# Patient Record
Sex: Male | Born: 1955 | Race: White | Hispanic: No | Marital: Married | Smoking: Former smoker
Health system: Southern US, Community
[De-identification: ages and names within clinical notes are randomized; demographics above are authoritative.]

## PROBLEM LIST (undated history)

## (undated) DIAGNOSIS — J439 Emphysema, unspecified: Secondary | ICD-10-CM

## (undated) DIAGNOSIS — I10 Essential (primary) hypertension: Secondary | ICD-10-CM

## (undated) DIAGNOSIS — M259 Joint disorder, unspecified: Secondary | ICD-10-CM

## (undated) DIAGNOSIS — Z8701 Personal history of pneumonia (recurrent): Secondary | ICD-10-CM

## (undated) DIAGNOSIS — D6851 Activated protein C resistance: Secondary | ICD-10-CM

## (undated) DIAGNOSIS — R4702 Dysphasia: Secondary | ICD-10-CM

## (undated) DIAGNOSIS — Z8719 Personal history of other diseases of the digestive system: Secondary | ICD-10-CM

## (undated) DIAGNOSIS — Z860100 Personal history of colon polyps, unspecified: Secondary | ICD-10-CM

## (undated) DIAGNOSIS — M7552 Bursitis of left shoulder: Secondary | ICD-10-CM

## (undated) DIAGNOSIS — R0609 Other forms of dyspnea: Secondary | ICD-10-CM

## (undated) DIAGNOSIS — M51369 Other intervertebral disc degeneration, lumbar region without mention of lumbar back pain or lower extremity pain: Secondary | ICD-10-CM

## (undated) DIAGNOSIS — G4733 Obstructive sleep apnea (adult) (pediatric): Secondary | ICD-10-CM

## (undated) DIAGNOSIS — G4730 Sleep apnea, unspecified: Secondary | ICD-10-CM

## (undated) DIAGNOSIS — G709 Myoneural disorder, unspecified: Secondary | ICD-10-CM

## (undated) DIAGNOSIS — G629 Polyneuropathy, unspecified: Secondary | ICD-10-CM

## (undated) DIAGNOSIS — J189 Pneumonia, unspecified organism: Secondary | ICD-10-CM

## (undated) DIAGNOSIS — R200 Anesthesia of skin: Secondary | ICD-10-CM

## (undated) DIAGNOSIS — R911 Solitary pulmonary nodule: Secondary | ICD-10-CM

## (undated) DIAGNOSIS — Z973 Presence of spectacles and contact lenses: Secondary | ICD-10-CM

## (undated) DIAGNOSIS — Z8709 Personal history of other diseases of the respiratory system: Secondary | ICD-10-CM

## (undated) DIAGNOSIS — R0600 Dyspnea, unspecified: Secondary | ICD-10-CM

## (undated) DIAGNOSIS — M7551 Bursitis of right shoulder: Secondary | ICD-10-CM

## (undated) DIAGNOSIS — K219 Gastro-esophageal reflux disease without esophagitis: Secondary | ICD-10-CM

## (undated) DIAGNOSIS — Z8711 Personal history of peptic ulcer disease: Secondary | ICD-10-CM

## (undated) DIAGNOSIS — I2510 Atherosclerotic heart disease of native coronary artery without angina pectoris: Secondary | ICD-10-CM

## (undated) DIAGNOSIS — K760 Fatty (change of) liver, not elsewhere classified: Secondary | ICD-10-CM

## (undated) DIAGNOSIS — M5136 Other intervertebral disc degeneration, lumbar region: Secondary | ICD-10-CM

## (undated) DIAGNOSIS — G8929 Other chronic pain: Secondary | ICD-10-CM

## (undated) DIAGNOSIS — K5792 Diverticulitis of intestine, part unspecified, without perforation or abscess without bleeding: Secondary | ICD-10-CM

## (undated) DIAGNOSIS — R208 Other disturbances of skin sensation: Secondary | ICD-10-CM

## (undated) DIAGNOSIS — C61 Malignant neoplasm of prostate: Secondary | ICD-10-CM

## (undated) DIAGNOSIS — M1990 Unspecified osteoarthritis, unspecified site: Secondary | ICD-10-CM

## (undated) DIAGNOSIS — K449 Diaphragmatic hernia without obstruction or gangrene: Secondary | ICD-10-CM

## (undated) DIAGNOSIS — I82409 Acute embolism and thrombosis of unspecified deep veins of unspecified lower extremity: Secondary | ICD-10-CM

## (undated) DIAGNOSIS — M109 Gout, unspecified: Secondary | ICD-10-CM

## (undated) HISTORY — DX: Solitary pulmonary nodule: R91.1

## (undated) HISTORY — DX: Emphysema, unspecified: J43.9

## (undated) HISTORY — PX: VIDEO ASSISTED THORACOSCOPY (VATS)/DECORTICATION: SHX6171

## (undated) HISTORY — PX: JOINT REPLACEMENT: SHX530

## (undated) HISTORY — PX: COLON SURGERY: SHX602

## (undated) HISTORY — PX: HEMORRHOID SURGERY: SHX153

## (undated) HISTORY — DX: Atherosclerotic heart disease of native coronary artery without angina pectoris: I25.10

## (undated) HISTORY — PX: HIP ARTHROPLASTY: SHX981

## (undated) HISTORY — DX: Unspecified osteoarthritis, unspecified site: M19.90

## (undated) HISTORY — PX: APPENDECTOMY: SHX54

## (undated) HISTORY — DX: Gout, unspecified: M10.9

## (undated) HISTORY — DX: Other chronic pain: G89.29

---
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
## 1983-08-05 HISTORY — PX: VASECTOMY: SHX75

## 2003-04-05 ENCOUNTER — Ambulatory Visit (HOSPITAL_COMMUNITY): Admission: RE | Admit: 2003-04-05 | Discharge: 2003-04-05 | Payer: Self-pay | Admitting: Internal Medicine

## 2003-08-05 HISTORY — PX: SHOULDER ARTHROSCOPY: SHX128

## 2004-08-04 DIAGNOSIS — Z8701 Personal history of pneumonia (recurrent): Secondary | ICD-10-CM

## 2004-08-04 HISTORY — DX: Personal history of pneumonia (recurrent): Z87.01

## 2005-03-23 ENCOUNTER — Inpatient Hospital Stay (HOSPITAL_COMMUNITY): Admission: EM | Admit: 2005-03-23 | Discharge: 2005-03-26 | Payer: Self-pay | Admitting: Emergency Medicine

## 2005-04-09 ENCOUNTER — Ambulatory Visit (HOSPITAL_COMMUNITY): Admission: RE | Admit: 2005-04-09 | Discharge: 2005-04-09 | Payer: Self-pay | Admitting: Family Medicine

## 2005-05-02 ENCOUNTER — Encounter: Admission: RE | Admit: 2005-05-02 | Discharge: 2005-05-02 | Payer: Self-pay | Admitting: Neurology

## 2005-05-03 ENCOUNTER — Encounter: Admission: RE | Admit: 2005-05-03 | Discharge: 2005-05-03 | Payer: Self-pay | Admitting: Neurology

## 2005-06-06 ENCOUNTER — Ambulatory Visit (HOSPITAL_COMMUNITY): Admission: RE | Admit: 2005-06-06 | Discharge: 2005-06-06 | Payer: Self-pay | Admitting: Family Medicine

## 2005-06-12 ENCOUNTER — Ambulatory Visit (HOSPITAL_COMMUNITY): Admission: RE | Admit: 2005-06-12 | Discharge: 2005-06-12 | Payer: Self-pay | Admitting: Family Medicine

## 2005-06-13 ENCOUNTER — Inpatient Hospital Stay (HOSPITAL_COMMUNITY): Admission: AD | Admit: 2005-06-13 | Discharge: 2005-06-17 | Payer: Self-pay | Admitting: Family Medicine

## 2005-06-13 ENCOUNTER — Encounter (INDEPENDENT_AMBULATORY_CARE_PROVIDER_SITE_OTHER): Payer: Self-pay | Admitting: Specialist

## 2005-06-13 ENCOUNTER — Ambulatory Visit (HOSPITAL_COMMUNITY): Admission: RE | Admit: 2005-06-13 | Discharge: 2005-06-13 | Payer: Self-pay | Admitting: Family Medicine

## 2005-07-01 ENCOUNTER — Ambulatory Visit (HOSPITAL_COMMUNITY): Admission: RE | Admit: 2005-07-01 | Discharge: 2005-07-01 | Payer: Self-pay | Admitting: Family Medicine

## 2005-07-04 ENCOUNTER — Ambulatory Visit (HOSPITAL_COMMUNITY): Admission: RE | Admit: 2005-07-04 | Discharge: 2005-07-04 | Payer: Self-pay | Admitting: Family Medicine

## 2005-08-04 HISTORY — PX: COLONOSCOPY: SHX174

## 2005-09-24 ENCOUNTER — Ambulatory Visit: Payer: Self-pay | Admitting: Internal Medicine

## 2005-10-17 ENCOUNTER — Ambulatory Visit: Payer: Self-pay | Admitting: Internal Medicine

## 2005-10-17 ENCOUNTER — Encounter (INDEPENDENT_AMBULATORY_CARE_PROVIDER_SITE_OTHER): Payer: Self-pay | Admitting: *Deleted

## 2005-10-17 ENCOUNTER — Ambulatory Visit (HOSPITAL_COMMUNITY): Admission: RE | Admit: 2005-10-17 | Discharge: 2005-10-17 | Payer: Self-pay | Admitting: Internal Medicine

## 2007-04-14 ENCOUNTER — Encounter (HOSPITAL_COMMUNITY): Admission: RE | Admit: 2007-04-14 | Discharge: 2007-05-04 | Payer: Self-pay | Admitting: Family Medicine

## 2007-09-10 ENCOUNTER — Encounter: Payer: Self-pay | Admitting: Pulmonary Disease

## 2008-01-20 ENCOUNTER — Ambulatory Visit: Payer: Self-pay | Admitting: Internal Medicine

## 2008-01-21 ENCOUNTER — Encounter (INDEPENDENT_AMBULATORY_CARE_PROVIDER_SITE_OTHER): Payer: Self-pay | Admitting: General Surgery

## 2008-01-21 ENCOUNTER — Observation Stay (HOSPITAL_COMMUNITY): Admission: RE | Admit: 2008-01-21 | Discharge: 2008-01-21 | Payer: Self-pay | Admitting: General Surgery

## 2008-01-24 ENCOUNTER — Ambulatory Visit (HOSPITAL_COMMUNITY): Admission: RE | Admit: 2008-01-24 | Discharge: 2008-01-24 | Payer: Self-pay | Admitting: Anesthesiology

## 2008-02-06 ENCOUNTER — Encounter: Admission: RE | Admit: 2008-02-06 | Discharge: 2008-02-06 | Payer: Self-pay | Admitting: Orthopedic Surgery

## 2009-02-21 ENCOUNTER — Ambulatory Visit (HOSPITAL_COMMUNITY): Admission: RE | Admit: 2009-02-21 | Discharge: 2009-02-21 | Payer: Self-pay | Admitting: Family Medicine

## 2009-03-27 ENCOUNTER — Encounter: Payer: Self-pay | Admitting: Cardiology

## 2009-03-28 ENCOUNTER — Encounter (INDEPENDENT_AMBULATORY_CARE_PROVIDER_SITE_OTHER): Payer: Self-pay | Admitting: Family Medicine

## 2009-03-28 ENCOUNTER — Ambulatory Visit: Payer: Self-pay | Admitting: Cardiology

## 2009-03-28 ENCOUNTER — Ambulatory Visit (HOSPITAL_COMMUNITY): Admission: RE | Admit: 2009-03-28 | Discharge: 2009-03-28 | Payer: Self-pay | Admitting: Family Medicine

## 2009-03-28 HISTORY — PX: TRANSTHORACIC ECHOCARDIOGRAM: SHX275

## 2009-08-21 ENCOUNTER — Encounter: Payer: Self-pay | Admitting: Cardiology

## 2009-08-21 ENCOUNTER — Ambulatory Visit (HOSPITAL_COMMUNITY): Admission: RE | Admit: 2009-08-21 | Discharge: 2009-08-21 | Payer: Self-pay | Admitting: Family Medicine

## 2009-08-29 ENCOUNTER — Telehealth: Payer: Self-pay | Admitting: Cardiology

## 2009-09-07 ENCOUNTER — Ambulatory Visit: Payer: Self-pay | Admitting: Cardiology

## 2009-09-07 DIAGNOSIS — R0602 Shortness of breath: Secondary | ICD-10-CM

## 2009-09-07 DIAGNOSIS — R5383 Other fatigue: Secondary | ICD-10-CM

## 2009-09-07 DIAGNOSIS — J930 Spontaneous tension pneumothorax: Secondary | ICD-10-CM | POA: Insufficient documentation

## 2009-09-07 DIAGNOSIS — M7150 Other bursitis, not elsewhere classified, unspecified site: Secondary | ICD-10-CM | POA: Insufficient documentation

## 2009-09-07 DIAGNOSIS — J939 Pneumothorax, unspecified: Secondary | ICD-10-CM | POA: Insufficient documentation

## 2009-09-07 DIAGNOSIS — J449 Chronic obstructive pulmonary disease, unspecified: Secondary | ICD-10-CM | POA: Insufficient documentation

## 2009-09-07 DIAGNOSIS — R5381 Other malaise: Secondary | ICD-10-CM

## 2009-09-11 ENCOUNTER — Ambulatory Visit: Payer: Self-pay | Admitting: Internal Medicine

## 2009-09-11 ENCOUNTER — Telehealth (INDEPENDENT_AMBULATORY_CARE_PROVIDER_SITE_OTHER): Payer: Self-pay

## 2009-09-12 ENCOUNTER — Ambulatory Visit: Payer: Self-pay | Admitting: Cardiovascular Disease

## 2009-09-12 ENCOUNTER — Ambulatory Visit: Payer: Self-pay | Admitting: Cardiology

## 2009-09-12 ENCOUNTER — Ambulatory Visit: Payer: Self-pay

## 2009-09-12 ENCOUNTER — Encounter (HOSPITAL_COMMUNITY): Admission: RE | Admit: 2009-09-12 | Discharge: 2009-11-07 | Payer: Self-pay | Admitting: Cardiology

## 2009-09-12 HISTORY — PX: CARDIOVASCULAR STRESS TEST: SHX262

## 2009-09-14 ENCOUNTER — Encounter: Payer: Self-pay | Admitting: Cardiology

## 2009-09-19 LAB — CONVERTED CEMR LAB: TSH: 1.34 microintl units/mL (ref 0.35–5.50)

## 2009-09-26 ENCOUNTER — Ambulatory Visit: Payer: Self-pay | Admitting: Cardiology

## 2009-09-27 ENCOUNTER — Ambulatory Visit: Payer: Self-pay | Admitting: Pulmonary Disease

## 2009-09-27 DIAGNOSIS — G4710 Hypersomnia, unspecified: Secondary | ICD-10-CM | POA: Insufficient documentation

## 2009-09-27 DIAGNOSIS — G4730 Sleep apnea, unspecified: Secondary | ICD-10-CM

## 2009-10-15 ENCOUNTER — Ambulatory Visit (HOSPITAL_BASED_OUTPATIENT_CLINIC_OR_DEPARTMENT_OTHER): Admission: RE | Admit: 2009-10-15 | Discharge: 2009-10-15 | Payer: Self-pay | Admitting: Pulmonary Disease

## 2009-10-17 ENCOUNTER — Ambulatory Visit: Payer: Self-pay | Admitting: Pulmonary Disease

## 2009-10-17 ENCOUNTER — Encounter: Payer: Self-pay | Admitting: Internal Medicine

## 2009-10-29 ENCOUNTER — Ambulatory Visit: Payer: Self-pay | Admitting: Pulmonary Disease

## 2009-12-16 ENCOUNTER — Encounter: Payer: Self-pay | Admitting: Pulmonary Disease

## 2009-12-26 ENCOUNTER — Telehealth (INDEPENDENT_AMBULATORY_CARE_PROVIDER_SITE_OTHER): Payer: Self-pay | Admitting: *Deleted

## 2010-01-14 ENCOUNTER — Encounter: Payer: Self-pay | Admitting: Pulmonary Disease

## 2010-09-05 NOTE — Assessment & Plan Note (Signed)
Summary: Cardiology Nuclear Study  Nuclear Med Background Indications for Stress Test: Evaluation for Ischemia   History: COPD, CT/MRI, Echo, Emphysema  History Comments: 8/10 Echo: EF=55-60%.  Symptoms: Chest Tightness, Fatigue, Fatigue with Exertion, SOB    Nuclear Pre-Procedure Cardiac Risk Factors: Family History - CAD, History of Smoking Caffeine/Decaff Intake: None NPO After: 8:30 PM Lungs: clear IV 0.9% NS with Angio Cath: 22g     IV Site: (R) AC IV Started by: Irean Hong RN Chest Size (in) 42     Height (in): 74 Weight (lb): 160 BMI: 20.62  Nuclear Med Study 1 or 2 day study:  1 day     Stress Test Type:  Stress Reading MD:  Charlton Haws, MD     Referring MD:  B.Brodie Resting Radionuclide:  Technetium 17m Tetrofosmin     Resting Radionuclide Dose:  10.0 mCi  Stress Radionuclide:  Technetium 10m Tetrofosmin     Stress Radionuclide Dose:  33.0 mCi   Stress Protocol Exercise Time (min):  14:00 min     Max HR:  153 bpm     Predicted Max HR:  168 bpm  Max Systolic BP: 170 mm Hg     Percent Max HR:  91.07 %     METS: 17.20 Rate Pressure Product:  16109    Stress Test Technologist:  Milana Na EMT-P     Nuclear Technologist:  Burna Mortimer Deal RT-N  Rest Procedure  Myocardial perfusion imaging was performed at rest 45 minutes following the intravenous administration of Myoview Technetium 29m Tetrofosmin.  Stress Procedure  The patient exercised for 14:00. The patient stopped due to sob and denied any chest pain.  There were no significant ST-T wave changes and rare pvcs/pacs.  Myoview was injected at peak exercise and myocardial perfusion imaging was performed after a brief delay.  QPS Raw Data Images:  Normal; no motion artifact; normal heart/lung ratio. Stress Images:  NI: Uniform and normal uptake of tracer in all myocardial segments. Rest Images:  Normal homogeneous uptake in all areas of the myocardium. Subtraction (SDS):  Normal Transient Ischemic  Dilatation:  1.00  (Normal <1.22)  Lung/Heart Ratio:  .30  (Normal <0.45)  Quantitative Gated Spect Images QGS EDV:  114 ml QGS ESV:  49 ml QGS EF:  57 % QGS cine images:  Normal  Findings Normal nuclear study      Overall Impression  Exercise Capacity: Good exercise capacity. BP Response: Normal blood pressure response. Clinical Symptoms: Dyspnea ECG Impression: No significant ST segment change suggestive of ischemia. Overall Impression: Normal stress nuclear study. Overall Impression Comments: Normal  Appended Document: Cardiology Nuclear Study Heather, Can you tell ok. BB  Appended Document: Cardiology Nuclear [facility].  Appended Document: Cardiology Nuclear Study Pt aware of results.

## 2010-09-05 NOTE — Progress Notes (Signed)
Summary: cpap download  Phone Note Call from Patient Call back at Home Phone [phone]   Caller: Patient Call For: [name] Summary of Call: pt cancelled his f/u appt w/ dr Vassie Loll for today. wants dr Vassie Loll to know that the co. that does the download for his cpap is having some issues w/ this at the moment so there's no need for him to come in today. however, pt will call to reschedule just as soon as he hears back from the co. just an FYI for dr Vassie Loll per caller. (no need for a call back).  Initial call taken by: Tivis Ringer, CNA,  Dec 26, 2009 9:32 AM  Follow-up for Phone Call        can we make sure probs are solved  Called [facility] and Angie stated that I needed to speak with Roderic Ovens on the status of download and she would be back in the office around 3:30. She will have her return my call. Alfonso Ramus  Dec 27, 2009 2:49 PM  Dianna Rossetti returning your call, her number is [phone].Darletta Moll  Dec 27, 2009 3:24 PM   Follow-up by: Comer Locket. Vassie Loll MD,  Dec 26, 2009 10:43 AM  Additional Follow-up for Phone Call Additional follow up Details #1::        leandra faxed download 12/27/09 Additional Follow-up by: Oneita Jolly,  Dec 28, 2009 5:06 PM

## 2010-09-05 NOTE — Assessment & Plan Note (Signed)
Summary: [name]   Visit Type:  Initial Consult Primary Provider:  John Giovanni  CC:  Fatigue for no reason.  History of Present Illness: The patient is 55 years old and is referred by Dr. Sudie Bailey for evaluation of fatigue. These symptoms have been present for about 6 months. He says when he tries to do some exercise sometimes he will get out very quickly with fatigue and had to stop. Other times he can do his exercise without too much problems. He does swimming and a lot of stretching exercises and some other aerobic exercises as well. He's had no chest pain with exercise. Sometimes the fatigue will occur at rest and not just related to exercise.  His risk profile her vascular disease includes a positive family history and a history of smoking.  He had an echocardiogram performed and a CT chest scan of the chest. The echo was normal and the CT scan showed some evidence of emphysema. He said Dr. Sudie Bailey check oxygen saturations before and after exercise and they were good.  Current Medications (verified): 1)  No Meds  Allergies (verified): No Known Drug Allergies  Past History:  Past Medical History: Reviewed history from 09/07/2009 and no changes required.  1.  Left spontaneous pneumothorax in 1984.  2.  Bilateral shoulder bursitis, recently treated with cortisone injections      by his company physician.  Family History: Reviewed history from 09/07/2009 and no changes required.  No known family history of colorectal carcinoma or chronic  GI problems.  Mother deceased at age 22 with history of CHF and diabetes.  Father, age 56, with history of COPD and coronary artery disease with CABG..  He has  three grown healthy brothers.  Social History: Reviewed history from 09/07/2009 and no changes required. Mr. Egelston is divorced.  He has two children who are grown  and healthy.  He is employed full time with a tobacco company.  He reports  30-plus pack-year history of tobacco  use.  He says he consumes a few cigarettes a day now.He consumes about three alcoholic  beverages a day.  He denies any drug use.  Vital Signs:  Patient profile:   55 year old male Height:      74 inches Weight:      168 pounds BMI:     21.65 Pulse rate:   70 / minute Pulse rhythm:   regular Resp:     18 per minute BP sitting:   112 / 62  (left arm) Cuff size:   regular  Vitals Entered By: Vikki Ports (September 07, 2009 1:42 PM)  Physical Exam  Additional Exam:  Gen. Well-nourished, in no distress Head: Normocephalic    Eyes: PERRLA/EOM intact; conjunctiva and lids normal Neck: No JVD, thyroid not enlarged, no carotid bruits Lungs: No tachypnea, clear w/o rales, rhonchi or wheezes CV: Rhythm regular, PMI not displaced,  sounds  nl, grade 1/6 systolic murmur at the left sternal edge, no edema, pulses nl UE and LE Abd: BS nl, abd soft and non-tender w/o masses or hepatosplenomegaly MS: No deformities Neuro: no focal sns Skin: no lesions Psych: nl affect    Impression & Recommendations:  Problem # 1:  FATIGUE / MALAISE (ICD-780.79) The etiology of the fatigue is not clear. It is not totally exertional although sometimes it does come on with exertion. He had no chest pain with exertion. We will plan to evaluate him for possible cardiac etiology for his symptoms and we'll arrange for  him to have a rest/exercise Myoview scan. We will also obtain the records from Dr. Michelle Nasuti office to see if there is any other source of fatigue.  Problem # 2:  COPD (ICD-496) He has a history of cigarette smoking and his CT scan showed some evidence of emphysema. We will evaluate him with Coumadin function tests.  Other Orders: EKG w/ Interpretation (93000) Pulmonary Function Test (PFT) Nuclear Stress Test (Nuc Stress Test)  Patient Instructions: 1)  Your physician recommends that you schedule a follow-up appointment in: 3 weeks. 2)  Your physician has requested that you have an exercise  stress myoview.  For further information please visit [email or web address]  Please follow instruction sheet, as given. 3)  Your physician has recommended that you have a pulmonary function test.  Pulmonary Function Tests are a group of tests that measure how well air moves in and out of your lungs.  Appended Document: [name] I received the records from Dr. Michelle Nasuti office. The patient has sleep apnea and had not been using CPAP. He then began using CPAP still had symptoms of fatigue.  His CBC was normal with a hemoglobin of 14.8. His comprehensive metabolic panel was normal including normal liver function tests. His hemoglobin A1c was 6.2. His blood gases showed a pH of 7.41, PCO2 of 36, and pO2 of 92.

## 2010-09-05 NOTE — Progress Notes (Signed)
Summary: Nuc. Pre-Procedure  Phone Note Outgoing Call Call back at Work Phone [phone]   Call placed by: Irean Hong, RN,  September 11, 2009 2:37 PM Summary of Call: Reviewed information on Myoview Information Sheet (see scanned document for further details).  Spoke with patient.     Nuclear Med Background Indications for Stress Test: Evaluation for Ischemia   History: COPD, CT/MRI, Echo, Emphysema  History Comments: 8/10 Echo: EF=55-60%.  Symptoms: Fatigue, Fatigue with Exertion    Nuclear Pre-Procedure Cardiac Risk Factors: Family History - CAD, History of Smoking Height (in): 74

## 2010-09-05 NOTE — Assessment & Plan Note (Signed)
Summary: rov after sleep study ///kp   Visit Type:  Follow-up Copy to:  Dr. Juanda Chance Primary Provider/Referring Provider:  John Giovanni  CC:  Pt here for follow up after sleep study.  History of Present Illness: 55/M, smoker for evaluation of excessive fatigue. Cardiac evaluation was normal by Dr Juanda Chance, testesterone, TSH & cortisol levels have been nml. He smoked about 70 PYrs, works as a Production designer, theatre/television/film for [employer] still smokes an occasional cigarette. He has a rigorous cross training schedule & has lost 60 lbs in the last 2 years. He finds that he cannot push himself lately like he was able to. Epworth Sleepiness Score is 14/24. Bedtime is 9-10pm, sleep latency is minimal, wakes up at 5 a , no dryness or headaches. He drinks 8-10 cups coffee/d PFTS showed airway obstruction with preserved  FEv1 90%, small airways 52% & DLCO 73%. Split pSG in 2/09 (Dr Dohmeier)  when wt was 215 lbs, BMI 28showed 200 hypopneas in 2 hrs of  sleep with AHi 99/h, supine AHi of 66/h corrected by CPAP 11 cm (to AHi of 5/h) & 2 L Kaser. Of note, he was recovering from a pneumonia at the time of the study. he was provided with CPAP by [facility] & used it for a year but stopped after his weight loss. No bed partner history available.  October 29, 2009 12:08 PM  reviewed PSG with pt , severe obstructive sleep apnea persists with AHi 31/h, start CPAP 9 cm with medium quattro mask + humidity, download in 4 wks There is no history suggestive of cataplexy, sleep paralysis or parasomnias   Allergies: No Known Drug Allergies  Past History:  Past Medical History: Last updated: 09/07/2009  1.  Left spontaneous pneumothorax in 1984.  2.  Bilateral shoulder bursitis, recently treated with cortisone injections      by his company physician.  Social History: Last updated: 09/27/2009 Mr. Alegria is divorced.  He has two children who are grown  and healthy.  He is employed full time with a tobacco company.  He reports  30-plus  pack-year history of tobacco use.  He says he consumes a few cigarettes a day now.He consumes about three alcoholic  beverages a day.  He denies any drug use. Occupation: Production designer, theatre/television/film  Review of Systems  The patient denies anorexia, fever, weight loss, weight gain, vision loss, decreased hearing, hoarseness, chest pain, syncope, dyspnea on exertion, peripheral edema, prolonged cough, headaches, hemoptysis, abdominal pain, melena, hematochezia, severe indigestion/heartburn, hematuria, muscle weakness, difficulty walking, depression, unusual weight change, and abnormal bleeding.    Vital Signs:  Patient profile:   55 year old male Height:      74 inches Weight:      168.25 pounds O2 Sat:      98 % on Room air Temp:     98.1 degrees F oral Pulse rate:   62 / minute BP sitting:   98 / 60  (left arm) Cuff size:   regular  Vitals Entered By: Zackery Barefoot CMA (October 29, 2009 11:36 AM)  O2 Flow:  Room air CC: Pt here for follow up after sleep study Comments Medications reviewed with patient Verified contact number and pharmacy with patient Zackery Barefoot CMA  October 29, 2009 11:36 AM    Physical Exam  Additional Exam:  Gen. Pleasant, well-nourished, in no distress, normal affect ENT - no lesions, no post nasal drip, class 2 airway Neck: No JVD, no thyromegaly, no carotid bruits Lungs: no use of accessory  muscles, no dullness to percussion, clear without rales or rhonchi  Cardiovascular: Rhythm regular, heart sounds  normal, no murmurs or gallops, no peripheral edema Musculoskeletal: No deformities, no cyanosis or clubbing      Impression & Recommendations:  Problem # 1:  HYPERSOMNIA, ASSOCIATED WITH SLEEP APNEA (ICD-780.53)  Based on his initial experience I think he will tolerate CPAP therapy well. It remains to be seen whether we will see an improvement in daytime fatigue. I would persist with this regardless. Doub that we will use stimulants here like modafinil. I had a frank  discussion with him re: expectations. Compliance encouraged, wt loss emphasized, asked to avoid meds with sedative side effects, cautioned against driving when sleepy.   Orders: Est. Patient Level III (16109)  Problem # 2:  COPD (ICD-496) mild , does not need bronchodilators. ct aerobic exercise.  Patient Instructions: 1)  Please schedule a follow-up appointment in 2 months. 2)  Lets have a conversation once I look at the download  Appended Document: rov after sleep study ///kp download 3/25-5/15 >> good usage on days used, residual AHI minimal but he needs to use CPAP on all nights !  Appended Document: rov after sleep study ///kp [facility]  Appended Document: rov after sleep study ///kp LMOMTCB x 2. Per RA "he needs to use CPAP on all nights !" Also need to get pt scheduled for follow up in July  Appended Document: rov after sleep study ///kp will send letter

## 2010-09-05 NOTE — Miscellaneous (Signed)
Summary: Orders Update pft charges  Clinical Lists Changes  Orders: Added new Service order of Carbon Monoxide diffusing w/capacity (94720) - Signed Added new Service order of Lung Volumes (94240) - Signed Added new Service order of Spirometry (Pre & Post) (94060) - Signed 

## 2010-09-05 NOTE — Assessment & Plan Note (Signed)
Summary: F/U CARDIOLITE/PFT   Visit Type:  Follow-up Primary Provider:  [name]  CC:  no complaints.  History of Present Illness: The patient is 55 years old and returns for evaluation of fatigue. He is a very active person and swims and walks on the treadmill but has been bothered by symptoms of fatigue over the past number of months. Dr. Georgia Dom about him for this and I saw him recently and did further evaluation to try and determine etiology of his fatigue. He had no associated chest pain with this.  He had an echocardiogram which showed normal LV function and normal valve function. He had a CT scan which showed no pulmonary embolus and mild COPD. His oxygen saturations were good at rest and with exercise. We did a breast exercise Myoview scan this was negative for ischemia. He had a normal TSH. He had pulmonary function tests that showed only mild COPD.  He does have a history of sleep apnea and has used CPAP in the past. He has not used it recently. He's not sure if he snores at night or if he stops breathing at night. He is also not sure if he gets a full night's rest at night. He does occasionally fall asleep during the day. His main symptom is fatigue.  Problems Prior to Update: 1)  COPD  (ICD-496) 2)  Fatigue / Malaise  (ICD-780.79) 3)  Fatigue / Malaise  (ICD-780.79) 4)  Shortness of Breath  (ICD-786.05) 5)  Bursitis  (ICD-727.3) 6)  Pneumothorax  (ICD-512.8)  Current Medications (verified): 1)  No Meds  Allergies (verified): No Known Drug Allergies  Past History:  Past Medical History: Reviewed history from 09/07/2009 and no changes required.  1.  Left spontaneous pneumothorax in 1984.  2.  Bilateral shoulder bursitis, recently treated with cortisone injections      by his company physician.  Review of Systems       ROS is negative except as outlined in HPI.   Vital Signs:  Patient profile:   55 year old male Height:      74 inches Weight:      165  pounds BMI:     21.26 Pulse rate:   61 / minute BP sitting:   113 / 67  (left arm)  Vitals Entered By: Burnett Kanaris, CNA (September 26, 2009 2:05 PM)  Physical Exam  Additional Exam:  Gen. Well-nourished, in no distress   Neck: No JVD, thyroid not enlarged, no carotid bruits Lungs: No tachypnea, clear without rales, rhonchi or wheezes Cardiovascular: Rhythm regular, PMI not displaced,  heart sounds  normal, no murmurs or gallops, no peripheral edema, pulses normal in all 4 extremities. Abdomen: BS normal, abdomen soft and non-tender without masses or organomegaly, no hepatosplenomegaly. MS: No deformities, no cyanosis or clubbing   Neuro:  No focal sns   Skin:  no lesions    Impression & Recommendations:  Problem # 1:  FATIGUE / MALAISE (ICD-780.79) The etiology of the fatigue is not clear but I don't think it is cardiac in etiology. Has a negative Myoview scan and a normal echo and a normal exam and ECG. I am suspicious this the fatigue is related to obstructive sleep apnea. He previously had this diagnosis and he is not currently using CPAP. I discussed this possibility with him and he is agreeable to further evaluation or consultation for this and we've arranged for him to see one of our pulmonary/sleep doctors. Orders: Pulmonary Referral (Pulmonary)  Problem #  2:  COPD (ICD-496) This appears mild based on his pulmonary function tests. I did encourage him to stop and discontinue cigarettes since this could get worse and since this will contribute to cardiac problems in the future.  Patient Instructions: 1)  We will refer you to pulmonary for evaluation of sleep apnea. 2)  Dr. Juanda Chance will see you back on an as needed basis.

## 2010-09-05 NOTE — Assessment & Plan Note (Signed)
Summary: fatigue/? sleep apnea/apc   Visit Type:  Initial Consult Copy to:  Dr. Juanda Chance Primary Provider/Referring Provider:  John Giovanni  CC:  Pt here for consult. Pt c/o fatigue during the day . Pt states not currently using CPAP machine ([facility]). Pt states does not want to continue using CPAP machine and has made lifestyle changes.  History of Present Illness: 53/M, smoker for evaluation of excessive fatigue. Cardiac evaluation was normal by Dr Juanda Chance, testesterone, TSH & cortisol levels have been nml. He smoked about 5 PYrs, works as a Production designer, theatre/television/film for [employer] still smokes an occasional cigarette. He has a rigorous cross training schedule & has lost 60 lbs in the last 2 years. He finds that he cannot push himself lately like he was able to. Epworth Sleepiness Score is 14/24. Bedtime is 9-10pm, sleep latency is minimal, wakes up at 5 a , no dryness or headaches. He drinks 8-10 cups coffee/d PFTS showed airway obstruction with preserved  FEv1 90%, small airways 52% & DLCO 73%. Split pSG in 2/09 (Dr Dohmeier)  when wt was 215 lbs, BMI 28showed 200 hypopneas in 2 hrs of  sleep with AHi 99/h, supine AHi of 66/h corrected by CPAP 11 cm (to AHi of 5/h) & 2 L [place]. Of note, he was recovering from a pneumonia at the time of the study. he was provided with CPAP by [facility] & used it for a year but stopped after his weight loss. No bed partner history available. There is no history suggestive of cataplexy, sleep paralysis or parasomnias    History of Present Illness: Fatigue  What time do you typically go to bed?(between what hours): 9pm 10pm  How long does it take you to fall asleep?  How many times during the night do you wake up? (0) rare if at all  What time do you get out of bed to start your day? 5am  Do you drive or operate heavy machinery in your occupation? no   How much has your weight changed (up or down) over the past two years? (in pounds): -60 lbs  Have you ever had  a sleep study before?  If yes,when and where: yes-2 years in 2009  Do you currently use CPAP ? If so , at what pressure? not @ present time  Do you wear oxygen at any time? If yes, how many liters per minute? no Current Medications (verified): 1)  No Meds  Allergies (verified): No Known Drug Allergies  Past History:  Past Medical History: Last updated: 09/07/2009  1.  Left spontaneous pneumothorax in 1984.  2.  Bilateral shoulder bursitis, recently treated with cortisone injections      by his company physician.  Past Surgical History: Last updated: 09/07/2009 None  Family History: Last updated: 09/07/2009  No known family history of colorectal carcinoma or chronic  GI problems.  Mother deceased at age 78 with history of CHF and diabetes.  Father, age 41, with history of COPD and coronary artery disease with CABG..  He has  three grown healthy brothers.  Social History: Mr. Havens is divorced.  He has two children who are grown  and healthy.  He is employed full time with a tobacco company.  He reports  30-plus pack-year history of tobacco use.  He says he consumes a few cigarettes a day now.He consumes about three alcoholic  beverages a day.  He denies any drug use. Occupation: Production designer, theatre/television/film  Review of Systems  The patient complains of shortness of breath with activity, shortness of breath at rest, and productive cough.  The patient denies non-productive cough, coughing up blood, chest pain, irregular heartbeats, acid heartburn, indigestion, loss of appetite, weight change, abdominal pain, difficulty swallowing, sore throat, tooth/dental problems, headaches, nasal congestion/difficulty breathing through nose, sneezing, itching, ear ache, anxiety, depression, hand/feet swelling, joint stiffness or pain, rash, change in color of mucus, and fever.    Vital Signs:  Patient profile:   55 year old male Height:      74 inches Weight:      168.25 pounds O2 Sat:      96 % on Room  air Temp:     98.0 degrees F oral Pulse rate:   66 / minute BP sitting:   102 / 68  (left arm) Cuff size:   regular  Vitals Entered By: Zackery Barefoot CMA (September 27, 2009 1:54 PM)  O2 Flow:  Room air CC: Pt here for consult. Pt c/o fatigue during the day . Pt states not currently using CPAP machine ([facility]). Pt states does not want to continue using CPAP machine and has made lifestyle changes Comments Medications reviewed with patient Verified contact number and pharmacy with patient Zackery Barefoot CMA  September 27, 2009 1:55 PM    Physical Exam  Additional Exam:  Gen. Pleasant, well-nourished, in no distress, normal affect ENT - no lesions, no post nasal drip, class 2 airway Neck: No JVD, no thyromegaly, no carotid bruits Lungs: no use of accessory muscles, no dullness to percussion, clear without rales or rhonchi  Cardiovascular: Rhythm regular, heart sounds  normal, no murmurs or gallops, no peripheral edema Abdomen: soft and non-tender, no hepatosplenomegaly, BS normal. Musculoskeletal: No deformities, no cyanosis or clubbing Neuro:  alert, non focal     Impression & Recommendations:  Problem # 1:  HYPERSOMNIA, ASSOCIATED WITH SLEEP APNEA (ICD-780.53) He has lost 60 lbs & I would expect his obstructive sleep apnea to have improved. Will schedule in lab study & compare with old one  - it is quite surprising that he had an AHI of 99/h with a bMI of 28. I would not be surprised to find that his oSA is not weight dependent & that he would need cPAP again. ALSO  of interest would be whether he needs O2 during sleep. Orders: Consultation Level III (16109) Sleep Disorder Referral (Sleep Disorder) Consultation Level IV (60454)  Problem # 2:  COPD (ICD-496) preserved FEV1 but significnat affection of smaller airways. Smoking cessation was emphasized - he does not want inhalers & I am in agreement with this.  Patient Instructions: 1)  Please schedule a follow-up  appointment in 2 weeks after sleep study  Appended Document: Orders Update reviewed PSG with pt , severe obstructive sleep apnea persists with AHi 31/h, start CPAP 9 cm with medium quattro mask + humidity, download in 4 wks   Clinical Lists Changes  Orders: Added new Referral order of DME Referral (DME) - Signed

## 2010-09-05 NOTE — Progress Notes (Signed)
Summary: Appt   Phone Note Outgoing Call Call back at [phone]   Call placed by: Sherri Rad, RN, BSN,  August 29, 2009 10:29 AM Call placed to: Patient Summary of Call: I LM for the pt to call about his appt scheduled for friday 09/07/09 @ 1:30pm with Dr. Juanda Chance. Sherri Rad, RN, BSN  August 29, 2009 10:30 AM  [facility] @ cell #- no answer @ the pt's home # x 10 rings. Sherri Rad, RN, BSN  August 30, 2009 11:18 AM  Pt aware of appt.  Initial call taken by: Sherri Rad, RN, BSN,  August 30, 2009 1:58 PM

## 2010-09-05 NOTE — Letter (Signed)
SummaryScience writer Pulmonary Care Appointment Letter  [facility]  [address]   [place], [place] [address]   Phone: [phone]  Fax: [phone]    01/14/2010 MRN: [id number]  [name] [address] [place], [place]  [address]  Dear Mr. BRAULT,   Our office is attempting to contact you about an appointment in July.  Please call our office at [phone] to schedule this appointment with Dr.Alva.  Our registration staff is prepared to assist you with any questions you may have.    Thank you,   Nature conservation officer Pulmonary Division

## 2010-10-20 LAB — BLOOD GAS, ARTERIAL
Bicarbonate: 22.6 mEq/L (ref 20.0–24.0)
FIO2: 0.21 %
O2 Saturation: 97.3 %
pCO2 arterial: 35.7 mmHg (ref 35.0–45.0)
pH, Arterial: 7.419 (ref 7.350–7.450)
pO2, Arterial: 92 mmHg (ref 80.0–100.0)

## 2010-12-17 NOTE — Assessment & Plan Note (Signed)
NAMEMarland Kitchen  [name], [name]               CHART#:  [id number]   DATE:  01/20/2008                       DOB:  1955-08-14   CHIEF COMPLAINT:  Proctalgia and hemorrhoids.   PROBLEM LIST:  1. History of internal hemorrhoids, which caused intermittent small-      volume hematochezia, confirmed on03/16/2007 colonoscopy.  2. Hyperplastic colon polyps.  3. Anxiety.  4. Depression.  5. Pleurisy and left lung abscess, requiring drainage.  6. Right shoulder rotator cuff repair.  7. Remote peptic ulcer disease.  8. H. pylori gastritis, status post treatment.   SUBJECTIVE:  This patient is a 55 year old Caucasian male who complains  of a 1-week history of a knot outside of my rectum, I think, is a  hemorrhoid.  It is quite painful.  It is worse when he coughs and worse  with sitting and movement.  He has been having small-to-moderate volume  pink-to-reddish bleeding, which has been staining his underwear.  He  denies any problems of constipation or diarrhea.  He has been using over-  the-counter Tucks pads as well as ointment without any relief.  He  denies any abdominal pain.  Denies any nausea or vomiting.  Denies any  fever or chills.  He has intentionally lost 30 pounds in the last 6  months or so, but he has been trying by dieting and eating much  healthier as well as exercising.   CURRENT MEDICATIONS:  1. Multivitamin daily.  2. Ibuprofen 200 mg p.r.n.  3. Cymbalta 60 mg daily.  4. Tucks ointment p.r.n.   ALLERGIES:  No known drug allergies.   FAMILY HISTORY:  There is no known family history of colorectal  carcinoma or other chronic GI problems.   SOCIAL HISTORY:  This patient is divorced.  He is involved with  maintenance, planning, and scheduling.  He has a 30-plus pack year  history of tobacco use.  He quit drinking alcohol in February 2009.  Denies any drug use.   REVIEW OF SYSTEMS:  See HPI, otherwise negative.   PHYSICAL EXAMINATION:  VITAL SIGNS:  Weight 175.5 pounds,  height 74  inches, temperature 98.5, blood pressure 110/70, and pulse 80.  GENERAL:  The patient is a well-developed, well-nourished Caucasian  male, in no acute distress.  HEENT:  Throat clear.  Sclerae nonicteric.  Conjunctivae pink.  Oropharynx pink and moist without any lesions.  CHEST:  Heart has a regular rate and rhythm.  No S1, S2.  No murmurs,  clicks, rubs, or gallops.  ABDOMEN:  Positive bowel sounds x4.  No bruits auscultated.  Soft,  nontender, and nondistended without palpable mass or hepatosplenomegaly.  RECTAL:  He had a large protruding bleeding rectal tissue, which is  approximately 2 x 1 cm, appears to be prolapsed versus prolapsed  internal hemorrhoid.  He is quite tender on exam.  I did attempt to  lubricate and manually reduce it; however, it is quite painful for the  patient.  EXTREMITIES:  Without clubbing or edema.   IMPRESSION:  This patient is a 55 year old Caucasian male with what  appears to be rectal prolapse of a possible internal hemorrhoid.  I have  sent him to his surgeon of choice, Dr. Malvin Johns, for further evaluation  and treatment.   PLAN:  Appointment today with Dr. Malvin Johns at 2:30 p.m. and he will go  there immediately.       [name], N.P.  Electronically Signed     R. Roetta Sessions, M.D.  Electronically Signed    KJ/MEDQ  D:  01/20/2008  T:  01/21/2008  Job:  [id number]   cc:   Barbaraann Barthel, M.D.  Mila Homer. Sudie Bailey, M.D.

## 2010-12-17 NOTE — Op Note (Signed)
NAME:  [name], [name] NO.:  [id number]   MEDICAL RECORD NO.:  [id number]          PATIENT TYPE:  OBV   LOCATION:  A316                          FACILITY:  [facility]   PHYSICIAN:  Barbaraann Barthel, M.D. DATE OF BIRTH:  1956-01-17   DATE OF PROCEDURE:  01/21/2008  DATE OF DISCHARGE:                               OPERATIVE REPORT   SURGEON:  Barbaraann Barthel, MD   PREOPERATIVE DIAGNOSIS:  Prolapsed bleeding, internal hemorrhoids,  possible perianal tumor.   POSTOPERATIVE DIAGNOSIS:  Internal hemorrhoids, prolapsed   Note that this is a 55 year old white male who was referred to me from  the GI service with perianal discomfort.  It was thought that he had  internal hemorrhoids.  On examination, this appeared very eroded and  there was in my mind  the possibility of a perianal tumor.  We were  unable to do any further examinations in my office as the risk of  bleeding and the patient's extreme discomfort.  We told him to continue  warm compresses, which he has been doing for the 3 weeks with this  problem, and we made plans for an outpatient procedure the following  morning.  We discussed the need for examination under anesthesia and  that I would likely do a rigid proctoscopy.  This patient has had a  previous colonoscopy the last year.  We would likely do an internal  hemorrhoidectomy to remove the lesion under spinal anesthesia as this  patient had considerable pulmonary problems in the past and he is a  heavy smoker.   We discussed complications not limited to but including bleeding,  infection, and the possibility of more surgery might be required  depending on the pathological findings.  Informed consent was obtained.   TECHNIQUE:  The patient was taken to the operating room and placed in  the jackknife prone position after spinal anesthesia.  Digital  examination and rigid proctoscopy were then performed.  This under  examination became quite clearly that I  was dealing with internal  hemorrhoid and not a dreaded perianal pathology.  We then examined the  patient at 20 cm under rigid proctoscopy.  I did not see any other  mucosal lesions or any other areas of concern.  We then grasped the  internal hemorrhoid with the Buie clamp.  I excised the internal  hemorrhoid using the cautery device.  This was in the left lateral  position and then oversewed this with 2-0 gut suture in a 2-layer  closure.  Bleeding was controlled and minimal.  I checked for  hemostasis, then irrigated with normal saline solution and dilute  Betadine solution and then used a perianal block using 0.5% Sensorcaine  for postoperative comfort.  A 2%  viscous Xylocaine ointment was then placed on a 4x4 and ABD pad.  Prior  to closure, all sponge, needle, and instrument counts were found to be  correct.  Estimated blood loss was minimal.  The patient received 600 mL  of crystalloids intraoperatively.  There were no complications.      Barbaraann Barthel, M.D.  Electronically Signed  WB/MEDQ  D:  01/21/2008  T:  01/21/2008  Job:  [id number]   cc:   R. Roetta Sessions, M.D.  [address]  [place]   [address]

## 2010-12-20 NOTE — Op Note (Signed)
NAME:  [name], [name]              ACCOUNT NO.:  [id number]   MEDICAL RECORD NO.:  [id number]           PATIENT TYPE:  AMB   LOCATION:  DAY                           FACILITY:  [facility]   PHYSICIAN:  R. Roetta Sessions, M.D. DATE OF BIRTH:  April 09, 1956   DATE OF PROCEDURE:  10/17/2005  DATE OF DISCHARGE:                                 OPERATIVE REPORT   PROCEDURE:  Colonoscopy with snare polypectomy and biopsy.   INDICATIONS FOR PROCEDURE:  A 55 year old gentleman with a 77-month history  of intermittent small-volume painless rectal bleeding. There is no family  history of colorectal neoplasia. Colonoscopy is now being done. This  approach has been discussed with the patient at length. Potential risks,  benefits, and alternatives have been reviewed and questions answered. He is  agreeable. Please see documentation in the medical record.   PROCEDURE NOTE:  O2 saturation, blood pressure, pulse, and respirations were  monitored throughout the entire procedure. Conscious sedation with IV Versed  and Demerol in incremental doses.   INSTRUMENT:  Olympus video chip system.   FINDINGS:  Digital rectal exam revealed no abnormalities.   ENDOSCOPIC FINDINGS:  Prep was good.   Rectum:  Examination of the rectal mucosa including retroflexed view of the  anal verge revealed some internal hemorrhoids and a 5-mm pedunculated polyp  at 3 cm in from the anal verge. Otherwise, rectal mucosa appeared normal.   Colon:  Colonic mucosa was surveyed from the rectosigmoid junction through  the left, transverse, and right colon to the area of the appendiceal  orifice, ileocecal valve, and cecum. These structures were well seen and  photographed for the record. From this level, the scope was slowly  withdrawn, and all previously mentioned mucosal surfaces were again seen.  There was a 1 x 4 cm multilobulated polyp on a fold in the mid ascending  colon that was resected in piecemeal fashion. Multiple fragments  submitted  to the pathologist. There was also a 5-mm mid descending colon polyp which  was removed with the snare. In the mid sigmoid colon, there were two areas  of markedly injected colonic mucosa on opposite walls. It appeared to be an  intramucosal change in appearance, but I did not see a demarcation zone.  These areas did not appear to be polyps. This could be scope trauma but had  an unusual appearance. They were cold biopsied. The polyp in the rectum was  removed with snare cautery. The patient tolerated the procedure well and  reactive to endoscopy.   IMPRESSION:  1.  Internal hemorrhoids. Diminutive rectal polyp removed with snare.  2.  Polyps in the mid descending and right colon resected as described      above. A couple of areas of injected folds mid descending colon of      doubtful clinical significance biopsied. The remainder of the colonic      mucosa appeared normal. Suspect the patient bleed from benign anorectal      source.   RECOMMENDATIONS:  1.  Hemorrhoid literature provided to the patient.  2.  Ten-day course of Anusol HC  suppositories 1 per rectum at bedtime.  3.  Follow up on pathology. CBC today for further recommendations to follow.      [name], M.D.  Electronically Signed     RMR/MEDQ  D:  10/17/2005  T:  10/18/2005  Job:  [id number]   cc:   Mila Homer. Sudie Bailey, M.D.  Fax: [phone]

## 2010-12-20 NOTE — H&P (Signed)
NAMEMarland Kitchen  Joe, [name] NO.:  [id number]   MEDICAL RECORD NO.:  [id number]          PATIENT TYPE:  INP   LOCATION:  A302                          FACILITY:  [facility]   PHYSICIAN:  Mila Homer. Sudie Bailey, M.D.DATE OF BIRTH:  17-Mar-1956   DATE OF ADMISSION:  06/13/2005  DATE OF DISCHARGE:  LH                                HISTORY & PHYSICAL   HISTORY:  This 55 year old was treated for pneumonia, about a month ago,  seemed to get better, and then took a relapse.  X-rays were suggestive of  left lower lobe pneumonia so we started back on Levaquin 750 mg daily which  he did well with and improved about 50%, but then just was not getting  better.  At this time I arranged for him to get a CT scan of the lung, which  showed an abscess in the left lower lung, just diagnosed yesterday.   He is generally healthy, he has been a long-term cigarette smoker, however,  and just stopped recently.  He does have emphysema secondary to this.  He  has been on prednisone 10 mg daily. For inflammation; and otherwise does not  use other medications.  [place]  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  Today he had aspiration of 60 mL of a yellowish moderate  viscosity purulent material of the left lower lung by Dr. Charlett Nose,  interventional radiologist.   Currently his temperature is 98 degrees, pulse 89, respiratory rate 20,  blood pressure 131/77.  He is resting comfortably.  He is oriented, alert,  well-developed, and well-nourished in no acute distress  The lungs actually  are fairly clear throughout, moving air well.  The heart has a regular  rhythm at a rate of about 90.  The abdomen is soft without  hepatosplenomegaly mass or tenderness. There is no edema of the ankles.  Skin turgor is normal.   ASSESSMENT:  1.  Left lower lobe pneumonia with abscess.  2.  Chronic obstructive pulmonary disease.  3.  History of cigarette smoking.   PLAN OF TREATMENT:  Will be  to have him in the hospital over the weekend  with irrigation of his 10-French drainage catheter with 10 mL normal saline  every shift.  We will keep him on Levaquin 750 mg daily; and discontinue the  prednisone.  Culture on the purulent material is pending.      Mila Homer. Sudie Bailey, M.D.  Electronically Signed     SDK/MEDQ  D:  06/13/2005  T:  06/13/2005  Job:  [id number]

## 2010-12-20 NOTE — Discharge Summary (Signed)
NAMEDYLLEN, MENNING NO.:  [id number]   MEDICAL RECORD NO.:  [id number]          PATIENT TYPE:  INP   LOCATION:  A338                          FACILITY:  [facility]   PHYSICIAN:  Kingsley Callander. Ouida Sills, MD       DATE OF BIRTH:  05-11-1956   DATE OF ADMISSION:  03/23/2005  DATE OF DISCHARGE:  08/23/2006LH                                 DISCHARGE SUMMARY   DISCHARGE DIAGNOSES:  1.  Community-acquired pneumonia involving the right middle lobe and      lingula.  2.  Emphysema.   HOSPITAL COURSE:  this patient is a 55 year old, white male who presented to  the emergency room with pleuritic chest pain.  His temperature was 99.1.  His chest x-ray revealed an infiltrate in the right base.  He underwent a CT  scan in the emergency room which revealed infiltrates in the right middle  lobe and lingula.  Moderate pulmonary emphysema was also present.  His ABG  revealed a pH of 7.34, pCO2 49, pO2 of 65 on 4 L.  His white count was 13.8.  D-dimer was 0.45.  He was hospitalized and treated with IV Rocephin and  Zithromax.  He experienced quite severe pleuritic chest pain requiring IV  morphine.  He had a low-grade fever which resolved.  His pain improved.  He  was improved enough for conversion to outpatient therapy on August 23.  His  oxygen saturations have run in the low 90s.  His pleuritic pain has also  been treated with Indocin.  A repeat white count was 13,000.   DISCHARGE MEDICATIONS:  1.  Augmentin 875 mg b.i.d. x7 more days.  2.  Indocin SR 75 mg b.i.d.  3.  Vicodin q.4h. p.r.n.  4.  He will hold his Arthrotec while on Indocin.   FOLLOW UP:  Follow-up with Dr. Sudie Bailey in 1 week.  It is recommended that  he have a follow-up chest x-ray to ensure clearance of his infiltrates.  He  has been encouraged to discontinue smoking.      Kingsley Callander. Ouida Sills, MD  Electronically Signed     ROF/MEDQ  D:  03/26/2005  T:  03/26/2005  Job:  [id number]

## 2010-12-20 NOTE — Group Therapy Note (Signed)
NAMEMarland Kitchen  [name], [name] NO.:  [id number]   MEDICAL RECORD NO.:  [id number]          PATIENT TYPE:  INP   LOCATION:  A302                          FACILITY:  [facility]   PHYSICIAN:  Mila Homer. Sudie Bailey, M.D.DATE OF BIRTH:  1955/10/25   DATE OF PROCEDURE:  06/17/2005  DATE OF DISCHARGE:                                   PROGRESS NOTE   SUBJECTIVE:  He is really feeling a lot better, in fact, he is getting sort  of bored.   OBJECTIVE:  VITAL SIGNS:  Temperature 97.6, pulse 75, respirations 20, blood  pressure 110/71.  GENERAL:  He is oriented and alert in no acute distress, well-developed,  well-nourished.  Color is good.  He is semi-recumbent in bed.  LUNGS:  Clear moving air well.  HEART:  Regular rhythm and rate at about 70.  EXTREMITIES:  There is no edema of the ankles.   His abscessed cavity in the left lower lobe did grow out microaerophilic  Streptococcus.   ASSESSMENT:  1.  Left lung abscess secondary to microaerophilic Streptococcus, question      from mouth flora.  2.  Chronic obstructive pulmonary disease.  3.  Tobacco use disorder.  4.  Alcohol use.   PLAN:  I talked to him at length.  I have discussed his case with  interventional radiologists.  We are redoing his CT scan with contrast and  if the abscessed cavity is gone and he is not having any increased drainage  from the lung (should not be more going in than is coming out and irrigation  of his abscess) then he will be able to be discharged home.  He is going to  stay off tobacco products and strongly recommend that he stay off of alcohol  until we review his case as an outpatient.  If there is any problem with  either CT or irrigations, he will stay another few days until everything  clears up.      Mila Homer. Sudie Bailey, M.D.  Electronically Signed     SDK/MEDQ  D:  06/17/2005  T:  06/17/2005  Job:  [id number]

## 2010-12-20 NOTE — Discharge Summary (Signed)
NAMEMarland Kitchen  [name], [name] NO.:  [id number]   MEDICAL RECORD NO.:  [id number]          PATIENT TYPE:  INP   LOCATION:  A302                          FACILITY:  [facility]   PHYSICIAN:  Mila Homer. Sudie Bailey, M.D.DATE OF BIRTH:  1955-12-21   DATE OF ADMISSION:  06/13/2005  DATE OF DISCHARGE:  11/14/2006LH                                 DISCHARGE SUMMARY   HOSPITAL COURSE:  This 55 year old was admitted to the hospital with  recurrent pneumonia.  He has a fairly benign 5-day hospital course extending  from June 13, 2005, through June 17, 2005.  Vital signs remained  stable.  His chest x-ray a loculated effusion at left lung base.  CT of the  chest showed a loculated air fluid collection in the left lower lobe  measuring 8.4 x 5.9 x 6 cm.  There was subsegmental atelectasis in the left  lower lobe with underlying COPD.  There was also an adjacent pleural  effusion and pleural thickening to his abscess.  Repeat chest x-ray after  drainage was accomplished showed a pigtail draining left lung abscess which  showed significant interval reduction in size the day after this was done.  He still had a reactive pleural thickening on the left.  His Gram stain of  the pus and abscessed cavity showed moderate gram-positive cocci in pairs  and no clusters.  This was both from anaerobic and routine cultures and did  grow out moderate growth of microaerophilic streptococci.   The patient was treated in the hospital with Levaquin 750 mg p.o. daily  which he had already been on.  He tolerated this well.  Each day he felt a  little bit stronger such that by his last and final day, he was much  improved.  Repeat CT scan of the lung on the day of discharge showed his  abscess had cleared, although he still had some reactive pleurisy noted.   DISCHARGE DIAGNOSES:  1.  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  2.  Chronic  obstructive pulmonary disease.  3.  History of cigarette smoking.   He is having his tube pulled prior to discharge from the hospital.  He will  be convalescing at home.  He will return to see [place] in about a week.   DISCHARGE MEDICATIONS:  He will continue on Levaquin 750 mg daily (another  10 with refill).   SPECIAL INSTRUCTIONS:  He is absolutely to stay away from cigarettes and  alcohol.      Mila Homer. Sudie Bailey, M.D.  Electronically Signed     SDK/MEDQ  D:  06/17/2005  T:  06/17/2005  Job:  [id number]

## 2010-12-20 NOTE — H&P (Signed)
NAMEDAVELLE, Joe NO.:  [id number]   MEDICAL RECORD NO.:  [id number]          PATIENT TYPE:  INP   LOCATION:  A338                          FACILITY:  [facility]   PHYSICIAN:  Kingsley Callander. Ouida Sills, MD       DATE OF BIRTH:  [birth year]   DATE OF ADMISSION:  03/23/2005  DATE OF DISCHARGE:  LH                                HISTORY & PHYSICAL   CHIEF COMPLAINT:  Chest pain.   HISTORY OF PRESENT ILLNESS:  This patient is a 55 year old white male who  presented to the emergency room with a 4-day history of pain in the left  chest.  He is a one-pack-per-day smoker.  He had noted recent night sweats.  He coughed up minimal sputum.  He was evaluated in the emergency room and  found to have a right lung infiltrate on chest x-ray.  There was concern for  a pulmonary embolus in light of his ABG, and he underwent a CT scan of the  lungs which revealed infiltrates in the right middle lobe and lingula.  He  was treated with Rocephin and Zithromax in the ER and was subsequently  admitted for additional therapy.   PAST MEDICAL HISTORY:  1.  Left spontaneous pneumothorax in 1984.  2.  Bilateral shoulder bursitis, recently treated with cortisone injections      by his company physician.   MEDICATIONS:  [employer] b.i.d.   ALLERGIES:  None.   SOCIAL HISTORY:  He is a one-pack-per-day smoker.  He has a drink of alcohol  daily.   FAMILY HISTORY:  His father has emphysema and had a bypass operation.  His  mother is deceased and had diabetes.   REVIEW OF SYSTEMS:  Noncontributory.   PHYSICAL EXAMINATION:  VITAL SIGNS:  Temperature initially 100.4, now 98.8.  Pulse 103, blood pressure 128/72.  Oxygen saturations have ranged from 90 to  99 on 2 L.  GENERAL:  Uncomfortable-appearing, alert, oriented white male who is  standing at the bedside leaning over to try to achieve comfort.  HEENT:  Eyes and oropharynx are unremarkable.  NECK:  Reveals no thyromegaly or lymphadenopathy.  LUNGS:   He takes shallow breaths and is reluctant to take deep breaths due  to pain.  He has chest wall tenderness bilaterally.  HEART:  Regular with no murmurs.  ABDOMEN:  Nontender.  No hepatosplenomegaly.  EXTREMITIES:  No cyanosis, clubbing or edema.  NEURO:  Grossly intact.   LABORATORY DATA:  White count 13.8, hemoglobin 13.5, platelets 292.  D-dimer  0.45.  His arterial blood gas on 4 L revealed a pH of 7.34, pCO2 of 49, a  pO2 of 65.  Sodium 134, potassium 3.5, bicarb 26, glucose 120, BUN 11,  creatinine 1, calcium 8.5.  His CK-MB is 3.2 and 3.  His EKG reveals normal  sinus rhythm and early transition.   IMPRESSION:  Bilateral pneumonia, now with pleuritic chest pain.   Continue Rocephin and Zithromax.  He has required morphine for pain relief.  We will add indomethacin and also add Protonix.      Kingsley Callander.  Ouida Sills, MD  Electronically Signed     ROF/MEDQ  D:  03/24/2005  T:  03/24/2005  Job:  [id number]

## 2010-12-20 NOTE — Group Therapy Note (Signed)
NAMEMarland Kitchen  LOUIE, FLENNER NO.:  [id number]   MEDICAL RECORD NO.:  [id number]          PATIENT TYPE:  INP   LOCATION:  A302                          FACILITY:  [facility]   PHYSICIAN:  Mila Homer. Sudie Bailey, M.D.DATE OF BIRTH:  [birth year]   DATE OF PROCEDURE:  06/14/2005  DATE OF DISCHARGE:                                   PROGRESS NOTE   SUBJECTIVE:  He is generally feeling well.   OBJECTIVE:  VITAL SIGNS:  Temperature 98.1, pulse 71, respiratory rate 20,  blood pressure 133/84.  GENERAL:  He is semi-recumbent in bed, no acute distress, well-developed,  well-nourished.  LUNGS:  Clear throughout. Moving air well.  HEART:  Regular rhythm without murmur.  CHEST:  There are intercostal retractions. No use of accessory muscles of  respiration.  ABDOMEN:  Soft without hepatosplenomegaly or masses.  EXTREMITIES:  No edema of the ankles.   The gram smear showed positive cocci in pairs and clusters.   ASSESSMENT:  Pneumonia with abscess, now being drained and on suction.   PLAN:  Continue current regimen. Pending is the culture on his abscess  drainage. Meanwhile continue the Levaquin 750 mg p.o. daily.      Mila Homer. Sudie Bailey, M.D.  Electronically Signed     SDK/MEDQ  D:  06/14/2005  T:  06/14/2005  Job:  [id number]

## 2010-12-20 NOTE — Consult Note (Signed)
NAME:  [name], [name] NO.:  [id number]   MEDICAL RECORD NO.:  [id number]          PATIENT TYPE:   LOCATION:                                 FACILITY:   PHYSICIAN:  R. Roetta Sessions, M.D. DATE OF BIRTH:  [birth year]   DATE OF CONSULTATION:  DATE OF DISCHARGE:                                   CONSULTATION   REQUESTING PHYSICIAN:  Dr. Sudie Bailey   REASON FOR CONSULTATION:  Intermittent rectal bleeding.   HISTORY OF PRESENT ILLNESS:  Mr. Bartmess is a 55 year old Caucasian male who  presents with a 64-month history of intermittent small volume rectal  bleeding.  He has noted bright red blood on the toilet paper as well as in  the commode.  He generally has 3 to 4 bowel movements a day, which are semi  formed and this has been normal for him.  He denies any abdominal pain or  proctalgia.  He denies any known history of hemorrhoidal disease.  He denies  any nausea or vomiting.  He does have rare heartburn for which he takes  Rolaids and Tums.  He has been on ibuprofen 400 mg b.i.d. for the last  approximately 8 weeks since he had right shoulder surgery.  He takes rare  Rolaids or Tums for his heartburn with good relief.  He denies any  dysphagia, odynophagia, anorexia or fatigue.   PAST MEDICAL HISTORY:  In August of 2006, he had severe pneumonia with left  lung abscess which had to be drained.  He had pleurisy.  He had right  shoulder rotator cuff surgery by Dr. Madelon Lips, in [place] on July 23, 2005.  He has a remote history of peptic ulcer disease many years ago.  He  did not require a transfusion or surgical intervention.  He has a history of  H. pylori gastritis and has been treated years ago.   CURRENT MEDICATIONS:  1.  Percocet q.6h. p.r.n.  2.  Multivitamin once daily.  3.  Ibuprofen 400 mg p.o. daily p.r.n.   ALLERGIES:  No known drug allergies.   FAMILY HISTORY:  No known family history of colorectal carcinoma or chronic  GI problems.  Mother  deceased at age 66 with history of CHF and diabetes.  Father, age 57, with history of COPD and coronary artery disease.  He has  three grown healthy brothers.   SOCIAL HISTORY:  Mr. Cronkright is divorced.  He has two children who are grown  and healthy.  He is employed full time with a tobacco company.  He reports  30-plus pack-year history of tobacco use.  He consumes about three alcoholic  beverages a day.  He denies any drug use.   REVIEW OF SYSTEMS:  CONSTITUTIONAL:  Weight is stable.  Denies any fatigue,  weight loss.  CARDIAC:  Denies any chest pain or palpitations.  PULMONARY:  He does have a chronic smoker's cough.  Denies any dyspnea, shortness of  breath or hemoptysis.  GASTROINTESTINAL:  See HPI.   PHYSICAL EXAMINATION:  VITAL SIGNS:  Weight 210 pounds, height 74 inches.  Temp 98.4 degrees, blood  pressure 142/80, pulse 98.  GENERAL:  Mr. Isaacson is a 55 year old obese Caucasian male who is alert and  oriented, pleasant and cooperative, in no acute distress.  HEENT:  Sclerae clear, nonicteric.  Conjunctivae pink.  Oropharynx pink and  moist without lesions.  NECK:  Supple without mass or thyromegaly.  CHEST:  Heart regular rate and rhythm.  Normal S1, S2 without any murmurs,  clicks, rubs or gallops.  LUNGS:  With decreased breath sounds bilaterally.  He does have  emphysematous changes throughout.  No acute distress.  ABDOMEN:  Protuberant with positive bowel sounds x4.  No bruits auscultated.  Soft, nontender, nondistended.  No pulsatile mass or hepatosplenomegaly.  No  rebound tenderness or guarding.  Exam is limited, given patient's body  habitus.  EXTREMITIES:  Without clubbing or edema bilaterally.  SKIN:  Pink, warm and dry without rash or jaundice.   IMPRESSION:  Mr. Warman is a 54 year old obese Caucasian male with 9 month  history of intermittent small-volume rectal bleeding.  He has no known  history of hemorrhoidal disease or diverticular disease.  Suspect he  could  be bleeding from benign anorectal source or diverticula as his bleeding is  painless.  Nonetheless, given his age, he is going to need further  evaluation to rule out colorectal carcinoma and determine the source of his  rectal bleeding.  He is on nonsteroidal anti-inflammatory drugs, however, I  do not know whether he has had a significant amount of nonsteroidal anti-  inflammatory drugs to produce nonsteroidal anti-inflammatory drug-induced  injury.   PLAN:  Will schedule colonoscopy with Dr. Jena Gauss in the near future.  I have  discussed the procedure including risks and benefits, which include but are  not limited to bleeding, infection, perforation, drug reaction.  He agrees  with plan.  Consent will be obtained.  I have asked him not to take  ibuprofen three days prior to the exam.   I would like to thank Dr. Sudie Bailey for allowing [place] to participate in the  care of Mr. Buonocore.      Nicholas Lose, N.P.      Jonathon Bellows, M.D.  Electronically Signed    KC/MEDQ  D:  09/24/2005  T:  09/25/2005  Job:  [id number]   cc:   Mila Homer. Sudie Bailey, M.D.  Fax: [phone]

## 2010-12-20 NOTE — Group Therapy Note (Signed)
NAMEMarland Kitchen  [name], [name] NO.:  [id number]   MEDICAL RECORD NO.:  [id number]          PATIENT TYPE:  INP   LOCATION:  A302                          FACILITY:  [facility]   PHYSICIAN:  Mila Homer. Sudie Bailey, M.D.DATE OF BIRTH:  12-Apr-1956   DATE OF PROCEDURE:  05/16/2005  DATE OF DISCHARGE:                                   PROGRESS NOTE   SUBJECTIVE:  He is generally feeling better and stronger since he has had  his abscess drained.   OBJECTIVE:  Temperature 97.7, pulse 73, respirations 16, blood pressure  105/60.  GENERAL APPEARANCE:  He is ambulating well with the tubes.  He is oriented  and alert, no acute distress.  Well-developed, well-nourished.  LUNGS:  Clear throughout, moving air well without intracostal retraction or  use of accessory muscle.  RESPIRATION:  Heart regular rhythm rate of about 70.  Color is good.   IMPRESSION:  The abscess drainage had Gram positive cocci in pairs, but the  culture still is not ready.   PLAN:  Will, of course, check with [facility] to get the culture results  and then talk to Interventional Radiology to see how long he will need his  tube in.  Presently, he is still draining a little bit of blood and  yellowish material, but mostly clear.      Mila Homer. Sudie Bailey, M.D.  Electronically Signed     SDK/MEDQ  D:  06/16/2005  T:  06/16/2005  Job:  [id number]

## 2010-12-20 NOTE — Group Therapy Note (Signed)
NAMEMarland [name]  JOVAUGHN, WOJTASZEK NO.:  [id number]   MEDICAL RECORD NO.:  [id number]          PATIENT TYPE:  INP   LOCATION:  A302                          FACILITY:  [facility]   PHYSICIAN:  Mila Homer. Sudie Bailey, M.D.DATE OF BIRTH:  1956/01/13   DATE OF PROCEDURE:  DATE OF DISCHARGE:                                   PROGRESS NOTE   SUBJECTIVE:  Says he is generally feeling better.   OBJECTIVE:  VITAL SIGNS:  Temperature 97.4, pulse 89, respirations 20, blood  pressure 120/82.  LUNGS:  Clear throughout, moving air well.  HEART:  Regular rhythm without murmur.  Rate of about 70.  He is sitting up  in bed having had something to eat, working on his laptop, etc.  He is in no  acute distress.  He is basically oriented and alert.  His chest tube is  draining a clear to yellow to white material.  The anaerobic cultures still  are not fully done.  Both have Gram positive cocci in pairs.   ASSESSMENT:  1.  Probably pneumococcal pneumonia with abscess.  2.  Chronic obstructive pulmonary disease.  3.  History of cigarettes smoking.   PLAN:  Continue Levaquin 750 mg p.o. daily and the irrigation of the abscess  cavity q.shift as per interventional radiologist.  Hopefully, the culture  will be ready by tomorrow, and we can see whether he can have his tube  pulled and go home.      Mila Homer. Sudie Bailey, M.D.  Electronically Signed     SDK/MEDQ  D:  06/15/2005  T:  06/15/2005  Job:  [id number]

## 2011-05-01 LAB — BASIC METABOLIC PANEL
Calcium: 9.6
GFR calc Af Amer: >60
GFR calc non Af Amer: >60
Potassium: 4.5
Sodium: 139

## 2011-05-01 LAB — DIFFERENTIAL
Basophils Absolute: 0.1
Basophils Relative: 1
Eosinophils Absolute: 0.2
Eosinophils Relative: 3
Lymphocytes Relative: 35
Monocytes Absolute: 0.6

## 2011-05-01 LAB — HEPATIC FUNCTION PANEL
ALT: 21
Bilirubin, Direct: <0.1

## 2011-05-01 LAB — CBC
HCT: 42.1
Hemoglobin: 14.6
MCHC: 34.7
MCV: 90
Platelets: 225
RDW: 13.7

## 2011-05-01 LAB — PROTIME-INR: Prothrombin Time: 13.8

## 2011-05-01 LAB — APTT: aPTT: 29

## 2011-05-19 ENCOUNTER — Emergency Department (HOSPITAL_COMMUNITY): Payer: 59

## 2011-05-19 ENCOUNTER — Emergency Department (HOSPITAL_COMMUNITY)
Admission: EM | Admit: 2011-05-19 | Discharge: 2011-05-19 | Disposition: A | Discharge status: Home / Self Care | Payer: 59 | Attending: Emergency Medicine | Admitting: Emergency Medicine

## 2011-05-19 DIAGNOSIS — R059 Cough, unspecified: Secondary | ICD-10-CM | POA: Insufficient documentation

## 2011-05-19 DIAGNOSIS — R0989 Other specified symptoms and signs involving the circulatory and respiratory systems: Secondary | ICD-10-CM | POA: Insufficient documentation

## 2011-05-19 DIAGNOSIS — R05 Cough: Secondary | ICD-10-CM | POA: Insufficient documentation

## 2011-05-19 DIAGNOSIS — R0609 Other forms of dyspnea: Secondary | ICD-10-CM | POA: Insufficient documentation

## 2011-05-19 DIAGNOSIS — F17200 Nicotine dependence, unspecified, uncomplicated: Secondary | ICD-10-CM | POA: Insufficient documentation

## 2011-05-19 DIAGNOSIS — J449 Chronic obstructive pulmonary disease, unspecified: Secondary | ICD-10-CM | POA: Insufficient documentation

## 2011-05-19 DIAGNOSIS — J4489 Other specified chronic obstructive pulmonary disease: Secondary | ICD-10-CM | POA: Insufficient documentation

## 2011-05-19 DIAGNOSIS — R0602 Shortness of breath: Secondary | ICD-10-CM | POA: Insufficient documentation

## 2011-05-19 LAB — COMPREHENSIVE METABOLIC PANEL
BUN: 11 mg/dL (ref 6–23)
Calcium: 9.3 mg/dL (ref 8.4–10.5)
GFR calc Af Amer: >90 mL/min (ref >90)
Glucose, Bld: 98 mg/dL (ref 70–99)
Sodium: 141 mEq/L (ref 135–145)
Total Protein: 6.7 g/dL (ref 6.0–8.3)

## 2011-05-19 LAB — CBC
MCH: 35 pg — ABNORMAL HIGH (ref 26.0–34.0)
MCV: 99.5 fL (ref 78.0–100.0)
Platelets: 136 10*3/uL — ABNORMAL LOW (ref 150–400)
RDW: 13 % (ref 11.5–15.5)
WBC: 8.4 10*3/uL (ref 4.0–10.5)

## 2011-05-19 LAB — DIFFERENTIAL
Basophils Relative: 0 % (ref 0–1)
Eosinophils Absolute: 0.2 10*3/uL (ref 0.0–0.7)
Eosinophils Relative: 3 % (ref 0–5)
Lymphs Abs: 2.2 10*3/uL (ref 0.7–4.0)

## 2011-11-11 ENCOUNTER — Encounter (HOSPITAL_COMMUNITY): Payer: Self-pay | Admitting: Emergency Medicine

## 2011-11-11 ENCOUNTER — Inpatient Hospital Stay (HOSPITAL_COMMUNITY)
Admission: EM | Admit: 2011-11-11 | Discharge: 2011-11-13 | DRG: 440 | Disposition: A | Discharge status: Home / Self Care | Payer: 59 | Attending: Internal Medicine | Admitting: Internal Medicine

## 2011-11-11 ENCOUNTER — Emergency Department (HOSPITAL_COMMUNITY): Payer: 59

## 2011-11-11 DIAGNOSIS — R5383 Other fatigue: Secondary | ICD-10-CM

## 2011-11-11 DIAGNOSIS — Z720 Tobacco use: Secondary | ICD-10-CM | POA: Diagnosis present

## 2011-11-11 DIAGNOSIS — I10 Essential (primary) hypertension: Secondary | ICD-10-CM | POA: Diagnosis present

## 2011-11-11 DIAGNOSIS — K859 Acute pancreatitis without necrosis or infection, unspecified: Principal | ICD-10-CM | POA: Diagnosis present

## 2011-11-11 DIAGNOSIS — K852 Alcohol induced acute pancreatitis without necrosis or infection: Secondary | ICD-10-CM | POA: Diagnosis present

## 2011-11-11 DIAGNOSIS — D696 Thrombocytopenia, unspecified: Secondary | ICD-10-CM | POA: Diagnosis present

## 2011-11-11 DIAGNOSIS — F1020 Alcohol dependence, uncomplicated: Secondary | ICD-10-CM | POA: Diagnosis present

## 2011-11-11 DIAGNOSIS — M7150 Other bursitis, not elsewhere classified, unspecified site: Secondary | ICD-10-CM

## 2011-11-11 DIAGNOSIS — R0602 Shortness of breath: Secondary | ICD-10-CM

## 2011-11-11 DIAGNOSIS — G4710 Hypersomnia, unspecified: Secondary | ICD-10-CM

## 2011-11-11 DIAGNOSIS — J4489 Other specified chronic obstructive pulmonary disease: Secondary | ICD-10-CM | POA: Diagnosis present

## 2011-11-11 DIAGNOSIS — J449 Chronic obstructive pulmonary disease, unspecified: Secondary | ICD-10-CM | POA: Diagnosis present

## 2011-11-11 DIAGNOSIS — F17200 Nicotine dependence, unspecified, uncomplicated: Secondary | ICD-10-CM | POA: Diagnosis present

## 2011-11-11 DIAGNOSIS — J930 Spontaneous tension pneumothorax: Secondary | ICD-10-CM

## 2011-11-11 HISTORY — DX: Essential (primary) hypertension: I10

## 2011-11-11 HISTORY — DX: Pneumonia, unspecified organism: J18.9

## 2011-11-11 LAB — DIFFERENTIAL
Basophils Absolute: 0 10*3/uL (ref 0.0–0.1)
Lymphocytes Relative: 16 % (ref 12–46)
Monocytes Absolute: 0.9 10*3/uL (ref 0.1–1.0)
Monocytes Relative: 10 % (ref 3–12)
Neutro Abs: 6.9 10*3/uL (ref 1.7–7.7)

## 2011-11-11 LAB — BASIC METABOLIC PANEL
BUN: 8 mg/dL (ref 6–23)
CO2: 23 mEq/L (ref 19–32)
Chloride: 99 mEq/L (ref 96–112)
Creatinine, Ser: 0.75 mg/dL (ref 0.50–1.35)

## 2011-11-11 LAB — HEPATIC FUNCTION PANEL
ALT: 53 U/L (ref 0–53)
Alkaline Phosphatase: 72 U/L (ref 39–117)
Indirect Bilirubin: 0.4 mg/dL (ref 0.3–0.9)
Total Protein: 7.6 g/dL (ref 6.0–8.3)

## 2011-11-11 LAB — CBC
HCT: 45.8 % (ref 39.0–52.0)
Hemoglobin: 16.4 g/dL (ref 13.0–17.0)
WBC: 9.5 10*3/uL (ref 4.0–10.5)

## 2011-11-11 MED ORDER — ONDANSETRON HCL 4 MG/2ML IJ SOLN
INTRAMUSCULAR | Status: AC
  Filled 2011-11-11: qty 2

## 2011-11-11 MED ORDER — ONDANSETRON HCL 4 MG/2ML IJ SOLN
4.0000 mg | Freq: Once | INTRAMUSCULAR | Status: AC
  Administered 2011-11-11: 4 mg via INTRAVENOUS

## 2011-11-11 MED ORDER — SODIUM CHLORIDE 0.9 % IV BOLUS (SEPSIS)
1000.0000 mL | Freq: Once | INTRAVENOUS | Status: AC
  Administered 2011-11-11: 1000 mL via INTRAVENOUS

## 2011-11-11 MED ORDER — MORPHINE SULFATE 4 MG/ML IJ SOLN
4.0000 mg | Freq: Once | INTRAMUSCULAR | Status: AC
  Administered 2011-11-11: 4 mg via INTRAVENOUS
  Filled 2011-11-11: qty 1

## 2011-11-11 MED ORDER — IOHEXOL 350 MG/ML SOLN
100.0000 mL | Freq: Once | INTRAVENOUS | Status: AC | PRN
  Administered 2011-11-11: 100 mL via INTRAVENOUS

## 2011-11-11 NOTE — H&P (Signed)
[name] is an 56 y.o. male.   Chief Complaint: Abdominal pain HPI: A 56 year old patient of Dr. Sudie Bailey in [place] who he is he weighs abdominal pain that has good to worse over the last 24 hours he drinks at least 2 beers everyday and smokes also excessively. Palpation he has not his abdominal pain on the sides on an off for the past 3 months. In the last 24 hours however it was excessive rated as 10 out of 10 mainly in the epigastric region and the upper chest radiating to his back. There was associated nausea but denied any vomiting. Denied any melena no bright red blood per rectum denied any hematemesis. Patient has felt jittery and worried so he decided to come in for father management. He has no prior history of gallstones or any other surgical procedure. Patient also denied any prior history of pancreatitis or any abdominal problem. In the ED he was found to have a lipase of over thousand.  Past Medical History  Diagnosis Date  . COPD (chronic obstructive pulmonary disease)   . Pneumonia   . Pneumothorax   . Hypertension     Past Surgical History  Procedure Date  . Thoracotomy     History reviewed. No pertinent family history. Social History:  reports that he has been smoking.  He does not have any smokeless tobacco history on file. He reports that he drinks alcohol. He reports that he does not use illicit drugs.  Allergies: No Known Allergies  Medications Prior to Admission  Medication Dose Route Frequency Provider Last Rate Last Dose  . iohexol (OMNIPAQUE) 350 MG/ML injection 100 mL  100 mL Intravenous Once PRN Medication Radiologist, MD   100 mL at 11/11/11 2148  . morphine 4 MG/ML injection 4 mg  4 mg Intravenous Once Pricilla Loveless, MD   4 mg at 11/11/11 2053  . morphine 4 MG/ML injection 4 mg  4 mg Intravenous Once Pricilla Loveless, MD   4 mg at 11/11/11 2208  . ondansetron (ZOFRAN) injection 4 mg  4 mg Intravenous Once Pricilla Loveless, MD   4 mg at 11/11/11 2058  .  sodium chloride 0.9 % bolus 1,000 mL  1,000 mL Intravenous Once Pricilla Loveless, MD   1,000 mL at 11/11/11 2211   No current outpatient prescriptions on file as of 11/11/2011.    Results for orders placed during the hospital encounter of 11/11/11 (from the past 48 hour(s))  CBC     Status: Abnormal   Collection Time   11/11/11  7:37 PM      Component Value Range Comment   WBC 9.5  4.0 - 10.5 (K/uL)    RBC 4.57  4.22 - 5.81 (MIL/uL)    Hemoglobin 16.4  13.0 - 17.0 (g/dL)    HCT 16.1  09.6 - 04.5 (%)    MCV 100.2 (*) 78.0 - 100.0 (fL)    MCH 35.9 (*) 26.0 - 34.0 (pg)    MCHC 35.8  30.0 - 36.0 (g/dL)    RDW 40.9  81.1 - 91.4 (%)    Platelets 148 (*) 150 - 400 (K/uL)   DIFFERENTIAL     Status: Normal   Collection Time   11/11/11  7:37 PM      Component Value Range Comment   Neutrophils Relative 73  43 - 77 (%)    Neutro Abs 6.9  1.7 - 7.7 (K/uL)    Lymphocytes Relative 16  12 - 46 (%)    Lymphs  Abs 1.5  0.7 - 4.0 (K/uL)    Monocytes Relative 10  3 - 12 (%)    Monocytes Absolute 0.9  0.1 - 1.0 (K/uL)    Eosinophils Relative 1  0 - 5 (%)    Eosinophils Absolute 0.1  0.0 - 0.7 (K/uL)    Basophils Relative 0  0 - 1 (%)    Basophils Absolute 0.0  0.0 - 0.1 (K/uL)   BASIC METABOLIC PANEL     Status: Abnormal   Collection Time   11/11/11  7:37 PM      Component Value Range Comment   Sodium 134 (*) 135 - 145 (mEq/L)    Potassium 3.8  3.5 - 5.1 (mEq/L)    Chloride 99  96 - 112 (mEq/L)    CO2 23  19 - 32 (mEq/L)    Glucose, Bld 103 (*) 70 - 99 (mg/dL)    BUN 8  6 - 23 (mg/dL)    Creatinine, Ser 1.61  0.50 - 1.35 (mg/dL)    Calcium 09.6  8.4 - 10.5 (mg/dL)    GFR calc non Af Amer >90  >90 (mL/min)    GFR calc Af Amer >90  >90 (mL/min)   HEPATIC FUNCTION PANEL     Status: Abnormal   Collection Time   11/11/11  8:46 PM      Component Value Range Comment   Total Protein 7.6  6.0 - 8.3 (g/dL)    Albumin 3.9  3.5 - 5.2 (g/dL)    AST 44 (*) 0 - 37 (U/L)    ALT 53  0 - 53 (U/L)    Alkaline  Phosphatase 72  39 - 117 (U/L)    Total Bilirubin 0.6  0.3 - 1.2 (mg/dL)    Bilirubin, Direct 0.2  0.0 - 0.3 (mg/dL)    Indirect Bilirubin 0.4  0.3 - 0.9 (mg/dL)   LIPASE, BLOOD     Status: Abnormal   Collection Time   11/11/11  8:46 PM      Component Value Range Comment   Lipase 1078 (*) 11 - 59 (U/L)   ETHANOL     Status: Normal   Collection Time   11/11/11 10:27 PM      Component Value Range Comment   Alcohol, Ethyl (B) <11  0 - 11 (mg/dL)    Dg Chest 2 View  0/11/5407  *RADIOLOGY REPORT*  Clinical Data: Chest pain and shortness of breath.  Cough.  COPD. Smoker.  CHEST - 2 VIEW  Comparison: 05/19/2011  Findings: Hyperinflation.  Patient rotated minimally left. Midline trachea.  Normal heart size and mediastinal contours.  Right costophrenic angle is excluded from the frontal. No pleural effusion or pneumothorax.  There is right greater than left biapical pleural thickening.  Suspicion of scarring at the right apex.  Moderate interstitial thickening, without focal consolidation.  IMPRESSION: COPD/chronic bronchitis. No acute superimposed process.  Original Report Authenticated By: Consuello Bossier, M.D.   Ct Angio Chest W/cm &/or Wo Cm  11/11/2011  *RADIOLOGY REPORT*  Clinical Data:  Mid chest pain, shortness breath and nausea; mid abdominal pain and lower back pain.  CT ANGIOGRAPHY CHEST, ABDOMEN AND PELVIS  Technique:  Multidetector CT imaging through the chest, abdomen and pelvis was performed using the standard protocol during bolus administration of intravenous contrast.  Multiplanar reconstructed images including MIPs were obtained and reviewed to evaluate the vascular anatomy.  Contrast: OMNIPAQUE IOHEXOL 350 MG/ML SOLN  Comparison:  CT of the chest, abdomen and  pelvis performed 02/21/2009  CTA CHEST  Findings:  There is no evidence of aortic dissection.  There is no evidence for aneurysmal dilatation.  The minimal calcification is noted along the aortic arch and proximal great vessels.   There is no evidence of significant pulmonary embolus.  Diffuse emphysematous change is noted within both lungs, particularly at the upper lung lobes bilaterally.  Bilateral dependent subsegmental atelectasis is noted.  There is no evidence of significant focal consolidation, pleural effusion or pneumothorax.  No masses are identified; no abnormal focal contrast enhancement is seen.  Scattered coronary artery calcifications are noted.  The mediastinum is otherwise unremarkable in appearance.  Scattered mediastinal nodes remain normal in size.  No pericardial effusion is identified.  No axillary lymphadenopathy is seen.  The thyroid gland is unremarkable in appearance.  No acute osseous abnormalities are seen.   Review of the MIP images confirms the above findings.  IMPRESSION:  1.  No evidence of air dissection. 2.  No evidence of significant pulmonary embolus. 3.  Diffuse emphysema within both lungs, particularly at the upper lung lobes bilaterally. 4.  Bilateral dependent subsegmental atelectasis noted. 5.  Scattered coronary artery calcifications seen.  CTA ABDOMEN AND PELVIS  Findings:  There is no evidence of aortic dissection.  Minimal calcification is noted along the abdominal aorta and its branches. The abdominal aorta is otherwise unremarkable in appearance.  The celiac trunk, superior mesenteric artery, renal arteries and inferior mesenteric artery remain fully patent.  Two right-sided renal arteries are seen.  Incidental note is made of a circumaortic left renal vein.  There is diffuse fatty infiltration within the liver.  The liver is otherwise unremarkable in appearance.  The spleen is within normal limits.  The gallbladder is unremarkable. The adrenal glands are within normal limits.  There is minimal soft tissue inflammation noted about the head and tail of the pancreas, raising question for minimal pancreatitis. This extends about the second and fourth segments of the duodenum. Associated trace soft  tissue stranding is noted extending inferiorly along Gerota's fascia on the left side.  There is no evidence of devascularization or pseudocyst formation at this time.  1.9 cm cyst is noted near the lower pole of the right kidney, similar in appearance to the prior study.  The kidneys are otherwise unremarkable in appearance.  There is no evidence of hydronephrosis.  No renal or ureteral stones are seen. Mild nonspecific perinephric stranding is noted bilaterally.  No free fluid is identified.  The small bowel is unremarkable in appearance.  A tiny hiatal hernia is seen; the stomach is otherwise unremarkable in appearance.  No acute vascular abnormalities are seen.  The appendix is normal in caliber, without evidence for appendicitis.  A trace amount of contrast is noted at the cecum. Diverticulosis is noted along the descending and sigmoid colon, without evidence of diverticulitis.  The bladder is mildly distended and grossly unremarkable in appearance.  The prostate remains normal in size.  No inguinal lymphadenopathy is seen.  No acute osseous abnormalities are identified.  Sclerotic foci within the right ilium are benign in appearance.   Review of the MIP images confirms the above findings.  IMPRESSION:  1.  No evidence of aortic dissection. 2.  Suspect minimal acute pancreatitis, with minimal soft tissue inflammation about the head and tail of the pancreas, extending about the second and fourth segments of the duodenum, and tracking inferiorly along Gerota's fascia on the left side. 3.  Diffuse fatty infiltration within the  liver. 4.  Stable right renal cyst. 5.  Tiny hiatal hernia seen. 6.  Diverticulosis along the descending sigmoid colon, without evidence of diverticulitis.  7.  Circumaortic left renal vein incidentally noted.  Original Report Authenticated By: Tonia Ghent, M.D.   Ct Angio Abd/pel W/ And/or W/o  11/11/2011  *RADIOLOGY REPORT*  Clinical Data:  Mid chest pain, shortness breath and nausea;  mid abdominal pain and lower back pain.  CT ANGIOGRAPHY CHEST, ABDOMEN AND PELVIS  Technique:  Multidetector CT imaging through the chest, abdomen and pelvis was performed using the standard protocol during bolus administration of intravenous contrast.  Multiplanar reconstructed images including MIPs were obtained and reviewed to evaluate the vascular anatomy.  Contrast: OMNIPAQUE IOHEXOL 350 MG/ML SOLN  Comparison:  CT of the chest, abdomen and pelvis performed 02/21/2009  CTA CHEST  Findings:  There is no evidence of aortic dissection.  There is no evidence for aneurysmal dilatation.  The minimal calcification is noted along the aortic arch and proximal great vessels.  There is no evidence of significant pulmonary embolus.  Diffuse emphysematous change is noted within both lungs, particularly at the upper lung lobes bilaterally.  Bilateral dependent subsegmental atelectasis is noted.  There is no evidence of significant focal consolidation, pleural effusion or pneumothorax.  No masses are identified; no abnormal focal contrast enhancement is seen.  Scattered coronary artery calcifications are noted.  The mediastinum is otherwise unremarkable in appearance.  Scattered mediastinal nodes remain normal in size.  No pericardial effusion is identified.  No axillary lymphadenopathy is seen.  The thyroid gland is unremarkable in appearance.  No acute osseous abnormalities are seen.   Review of the MIP images confirms the above findings.  IMPRESSION:  1.  No evidence of air dissection. 2.  No evidence of significant pulmonary embolus. 3.  Diffuse emphysema within both lungs, particularly at the upper lung lobes bilaterally. 4.  Bilateral dependent subsegmental atelectasis noted. 5.  Scattered coronary artery calcifications seen.  CTA ABDOMEN AND PELVIS  Findings:  There is no evidence of aortic dissection.  Minimal calcification is noted along the abdominal aorta and its branches. The abdominal aorta is otherwise  unremarkable in appearance.  The celiac trunk, superior mesenteric artery, renal arteries and inferior mesenteric artery remain fully patent.  Two right-sided renal arteries are seen.  Incidental note is made of a circumaortic left renal vein.  There is diffuse fatty infiltration within the liver.  The liver is otherwise unremarkable in appearance.  The spleen is within normal limits.  The gallbladder is unremarkable. The adrenal glands are within normal limits.  There is minimal soft tissue inflammation noted about the head and tail of the pancreas, raising question for minimal pancreatitis. This extends about the second and fourth segments of the duodenum. Associated trace soft tissue stranding is noted extending inferiorly along Gerota's fascia on the left side.  There is no evidence of devascularization or pseudocyst formation at this time.  1.9 cm cyst is noted near the lower pole of the right kidney, similar in appearance to the prior study.  The kidneys are otherwise unremarkable in appearance.  There is no evidence of hydronephrosis.  No renal or ureteral stones are seen. Mild nonspecific perinephric stranding is noted bilaterally.  No free fluid is identified.  The small bowel is unremarkable in appearance.  A tiny hiatal hernia is seen; the stomach is otherwise unremarkable in appearance.  No acute vascular abnormalities are seen.  The appendix is normal in caliber, without  evidence for appendicitis.  A trace amount of contrast is noted at the cecum. Diverticulosis is noted along the descending and sigmoid colon, without evidence of diverticulitis.  The bladder is mildly distended and grossly unremarkable in appearance.  The prostate remains normal in size.  No inguinal lymphadenopathy is seen.  No acute osseous abnormalities are identified.  Sclerotic foci within the right ilium are benign in appearance.   Review of the MIP images confirms the above findings.  IMPRESSION:  1.  No evidence of aortic  dissection. 2.  Suspect minimal acute pancreatitis, with minimal soft tissue inflammation about the head and tail of the pancreas, extending about the second and fourth segments of the duodenum, and tracking inferiorly along Gerota's fascia on the left side. 3.  Diffuse fatty infiltration within the liver. 4.  Stable right renal cyst. 5.  Tiny hiatal hernia seen. 6.  Diverticulosis along the descending sigmoid colon, without evidence of diverticulitis.  7.  Circumaortic left renal vein incidentally noted.  Original Report Authenticated By: Tonia Ghent, M.D.    Review of Systems  Constitutional: Positive for diaphoresis.  Cardiovascular: Positive for chest pain.  Gastrointestinal: Positive for nausea, abdominal pain and blood in stool. Negative for vomiting, diarrhea, constipation and melena.  All other systems reviewed and are negative.    Blood pressure 144/94, pulse 84, temperature 98 F (36.7 C), temperature source Oral, resp. rate 23, SpO2 94.00%. Physical Exam  Nursing note and vitals reviewed. Constitutional: He is oriented to person, place, and time. He appears well-developed and well-nourished.  HENT:  Head: Normocephalic and atraumatic.  Right Ear: External ear normal.  Left Ear: External ear normal.  Nose: Nose normal.  Mouth/Throat: Oropharynx is clear and moist.  Eyes: Conjunctivae and EOM are normal. Pupils are equal, round, and reactive to light.  Neck: Normal range of motion. Neck supple.  Cardiovascular: Normal rate, regular rhythm, normal heart sounds and intact distal pulses.   Respiratory: Effort normal and breath sounds normal.  GI: Soft. Bowel sounds are normal. He exhibits no distension and no mass. There is tenderness. There is no rebound and no guarding.       Epigastric tenderness  Musculoskeletal: Normal range of motion.  Neurological: He is alert and oriented to person, place, and time. He has normal reflexes.  Skin: Skin is warm and dry.  Psychiatric: He  has a normal mood and affect. His behavior is normal. Judgment and thought content normal.     Assessment/Plan Assessment and plan this is a 56 year old gentleman presenting with what appears to be alcoholic pancreatitis. Next CT scan did not show any evidence of gallstones this most likely is due to his alcoholism.  Plan  #1 Acute pancreatitis: more than likely alcoholic pancreatitis. We will admit the patient for bowel rest and control nausea control. We will start him on IV fluids as well as Dilaudid. I will check ultrasound of the right upper quadrant to rule out gallstones although this is more than likely alcoholic. Patient to start having diet advanced as his lipase dropped on symptoms improve. #2 alcoholism: patient will be started on alcohol withdrawal protocol. I have had extensive discussion with him the need to quit alcohol intake. Patient agrees to wear a pad once he leaves the hospital and is ready to go to detox if possible. Will start your multivitamin thiamine and folic acid IV for now and hopefully respond to oral when he leaves the hospital #3 tobacco abuse: again he smokes about a pack a  day. I will give him a nicotine patch as well as tobacco cessation counseling. #4 thrombocytopenia: more than likely from his alcoholism. I discussed also with him the need to quit alcohol if possible  Kennice Finnie,LAWAL 11/11/2011, 11:27 PM

## 2011-11-11 NOTE — ED Provider Notes (Signed)
History     CSN: [id number]  Arrival date & time 11/11/11  1924   First MD Initiated Contact with Patient 11/11/11 2031      Chief Complaint  Patient presents with  . Chest Pain    (Consider location/radiation/quality/duration/timing/severity/associated sxs/prior treatment) Patient is a 56 y.o. male presenting with chest pain.  Chest Pain The chest pain began 6 - 12 hours ago. Episode frequency: intermittently for a few hours then constantly for past 4 hours. The chest pain is worsening. At its most intense, the pain is at 8/10. The severity of the pain is severe. The quality of the pain is described as sharp. Radiates to: left and right abdomen. Exacerbated by: unsure. Primary symptoms include shortness of breath, abdominal pain and nausea. Pertinent negatives for primary symptoms include no fever, no cough, no palpitations and no vomiting.  Pertinent negatives for associated symptoms include no numbness.     Past Medical History  Diagnosis Date  . COPD (chronic obstructive pulmonary disease)   . Pneumonia   . Pneumothorax     Past Surgical History  Procedure Date  . Thoracotomy     No family history on file.  History  Substance Use Topics  . Smoking status: Current Everyday Smoker  . Smokeless tobacco: Not on file  . Alcohol Use: Yes      Review of Systems  Constitutional: Negative for fever and chills.  HENT: Negative for congestion and rhinorrhea.   Respiratory: Positive for shortness of breath. Negative for cough.   Cardiovascular: Positive for chest pain. Negative for palpitations and leg swelling.  Gastrointestinal: Positive for nausea and abdominal pain. Negative for vomiting, diarrhea, constipation and blood in stool.  Genitourinary: Negative for dysuria and decreased urine volume.  Musculoskeletal: Positive for back pain.  Neurological: Negative for numbness and headaches.  Psychiatric/Behavioral: Negative for confusion.  All other systems reviewed and  are negative.    Allergies  Review of patient's allergies indicates no known allergies.  Home Medications   Current Outpatient Rx  Name Route Sig Dispense Refill  . ADULT MULTIVITAMIN W/MINERALS CH Oral Take 1 tablet by mouth daily.      BP 151/90  Pulse 90  Temp(Src) 98 F (36.7 C) (Oral)  Resp 24  SpO2 96%  Physical Exam  Nursing note and vitals reviewed. Constitutional: He is oriented to person, place, and time. He appears well-developed and well-nourished.  HENT:  Head: Normocephalic and atraumatic.  Right Ear: External ear normal.  Left Ear: External ear normal.  Nose: Nose normal.  Neck: Neck supple.  Cardiovascular: Normal rate, regular rhythm, normal heart sounds and intact distal pulses.   Pulmonary/Chest: Effort normal. He exhibits no tenderness.  Abdominal: Soft. He exhibits no distension and no mass. There is tenderness in the right upper quadrant, epigastric area and left upper quadrant. There is no rebound and no guarding.  Musculoskeletal: He exhibits no edema.  Lymphadenopathy:    He has no cervical adenopathy.  Neurological: He is alert and oriented to person, place, and time.  Skin: Skin is warm and dry.    ED Course  Procedures (including critical care time)  Labs Reviewed  CBC - Abnormal; Notable for the following:    MCV 100.2 (*)    MCH 35.9 (*)    Platelets 148 (*)    All other components within normal limits  BASIC METABOLIC PANEL - Abnormal; Notable for the following:    Sodium 134 (*)    Glucose, Bld 103 (*)  All other components within normal limits  HEPATIC FUNCTION PANEL - Abnormal; Notable for the following:    AST 44 (*)    All other components within normal limits  LIPASE, BLOOD - Abnormal; Notable for the following:    Lipase 1078 (*)    All other components within normal limits  DIFFERENTIAL  ETHANOL   Dg Chest 2 View  11/11/2011  *RADIOLOGY REPORT*  Clinical Data: Chest pain and shortness of breath.  Cough.  COPD.  Smoker.  CHEST - 2 VIEW  Comparison: 05/19/2011  Findings: Hyperinflation.  Patient rotated minimally left. Midline trachea.  Normal heart size and mediastinal contours.  Right costophrenic angle is excluded from the frontal. No pleural effusion or pneumothorax.  There is right greater than left biapical pleural thickening.  Suspicion of scarring at the right apex.  Moderate interstitial thickening, without focal consolidation.  IMPRESSION: COPD/chronic bronchitis. No acute superimposed process.  Original Report Authenticated By: Consuello Bossier, M.D.   Ct Angio Chest W/cm &/or Wo Cm  11/11/2011  *RADIOLOGY REPORT*  Clinical Data:  Mid chest pain, shortness breath and nausea; mid abdominal pain and lower back pain.  CT ANGIOGRAPHY CHEST, ABDOMEN AND PELVIS  Technique:  Multidetector CT imaging through the chest, abdomen and pelvis was performed using the standard protocol during bolus administration of intravenous contrast.  Multiplanar reconstructed images including MIPs were obtained and reviewed to evaluate the vascular anatomy.  Contrast: OMNIPAQUE IOHEXOL 350 MG/ML SOLN  Comparison:  CT of the chest, abdomen and pelvis performed 02/21/2009  CTA CHEST  Findings:  There is no evidence of aortic dissection.  There is no evidence for aneurysmal dilatation.  The minimal calcification is noted along the aortic arch and proximal great vessels.  There is no evidence of significant pulmonary embolus.  Diffuse emphysematous change is noted within both lungs, particularly at the upper lung lobes bilaterally.  Bilateral dependent subsegmental atelectasis is noted.  There is no evidence of significant focal consolidation, pleural effusion or pneumothorax.  No masses are identified; no abnormal focal contrast enhancement is seen.  Scattered coronary artery calcifications are noted.  The mediastinum is otherwise unremarkable in appearance.  Scattered mediastinal nodes remain normal in size.  No pericardial effusion is  identified.  No axillary lymphadenopathy is seen.  The thyroid gland is unremarkable in appearance.  No acute osseous abnormalities are seen.   Review of the MIP images confirms the above findings.  IMPRESSION:  1.  No evidence of air dissection. 2.  No evidence of significant pulmonary embolus. 3.  Diffuse emphysema within both lungs, particularly at the upper lung lobes bilaterally. 4.  Bilateral dependent subsegmental atelectasis noted. 5.  Scattered coronary artery calcifications seen.  CTA ABDOMEN AND PELVIS  Findings:  There is no evidence of aortic dissection.  Minimal calcification is noted along the abdominal aorta and its branches. The abdominal aorta is otherwise unremarkable in appearance.  The celiac trunk, superior mesenteric artery, renal arteries and inferior mesenteric artery remain fully patent.  Two right-sided renal arteries are seen.  Incidental note is made of a circumaortic left renal vein.  There is diffuse fatty infiltration within the liver.  The liver is otherwise unremarkable in appearance.  The spleen is within normal limits.  The gallbladder is unremarkable. The adrenal glands are within normal limits.  There is minimal soft tissue inflammation noted about the head and tail of the pancreas, raising question for minimal pancreatitis. This extends about the second and fourth segments of the  duodenum. Associated trace soft tissue stranding is noted extending inferiorly along Gerota's fascia on the left side.  There is no evidence of devascularization or pseudocyst formation at this time.  1.9 cm cyst is noted near the lower pole of the right kidney, similar in appearance to the prior study.  The kidneys are otherwise unremarkable in appearance.  There is no evidence of hydronephrosis.  No renal or ureteral stones are seen. Mild nonspecific perinephric stranding is noted bilaterally.  No free fluid is identified.  The small bowel is unremarkable in appearance.  A tiny hiatal hernia is seen;  the stomach is otherwise unremarkable in appearance.  No acute vascular abnormalities are seen.  The appendix is normal in caliber, without evidence for appendicitis.  A trace amount of contrast is noted at the cecum. Diverticulosis is noted along the descending and sigmoid colon, without evidence of diverticulitis.  The bladder is mildly distended and grossly unremarkable in appearance.  The prostate remains normal in size.  No inguinal lymphadenopathy is seen.  No acute osseous abnormalities are identified.  Sclerotic foci within the right ilium are benign in appearance.   Review of the MIP images confirms the above findings.  IMPRESSION:  1.  No evidence of aortic dissection. 2.  Suspect minimal acute pancreatitis, with minimal soft tissue inflammation about the head and tail of the pancreas, extending about the second and fourth segments of the duodenum, and tracking inferiorly along Gerota's fascia on the left side. 3.  Diffuse fatty infiltration within the liver. 4.  Stable right renal cyst. 5.  Tiny hiatal hernia seen. 6.  Diverticulosis along the descending sigmoid colon, without evidence of diverticulitis.  7.  Circumaortic left renal vein incidentally noted.  Original Report Authenticated By: Tonia Ghent, M.D.   Ct Angio Abd/pel W/ And/or W/o  11/11/2011  *RADIOLOGY REPORT*  Clinical Data:  Mid chest pain, shortness breath and nausea; mid abdominal pain and lower back pain.  CT ANGIOGRAPHY CHEST, ABDOMEN AND PELVIS  Technique:  Multidetector CT imaging through the chest, abdomen and pelvis was performed using the standard protocol during bolus administration of intravenous contrast.  Multiplanar reconstructed images including MIPs were obtained and reviewed to evaluate the vascular anatomy.  Contrast: OMNIPAQUE IOHEXOL 350 MG/ML SOLN  Comparison:  CT of the chest, abdomen and pelvis performed 02/21/2009  CTA CHEST  Findings:  There is no evidence of aortic dissection.  There is no evidence for  aneurysmal dilatation.  The minimal calcification is noted along the aortic arch and proximal great vessels.  There is no evidence of significant pulmonary embolus.  Diffuse emphysematous change is noted within both lungs, particularly at the upper lung lobes bilaterally.  Bilateral dependent subsegmental atelectasis is noted.  There is no evidence of significant focal consolidation, pleural effusion or pneumothorax.  No masses are identified; no abnormal focal contrast enhancement is seen.  Scattered coronary artery calcifications are noted.  The mediastinum is otherwise unremarkable in appearance.  Scattered mediastinal nodes remain normal in size.  No pericardial effusion is identified.  No axillary lymphadenopathy is seen.  The thyroid gland is unremarkable in appearance.  No acute osseous abnormalities are seen.   Review of the MIP images confirms the above findings.  IMPRESSION:  1.  No evidence of air dissection. 2.  No evidence of significant pulmonary embolus. 3.  Diffuse emphysema within both lungs, particularly at the upper lung lobes bilaterally. 4.  Bilateral dependent subsegmental atelectasis noted. 5.  Scattered coronary artery calcifications seen.  CTA ABDOMEN AND PELVIS  Findings:  There is no evidence of aortic dissection.  Minimal calcification is noted along the abdominal aorta and its branches. The abdominal aorta is otherwise unremarkable in appearance.  The celiac trunk, superior mesenteric artery, renal arteries and inferior mesenteric artery remain fully patent.  Two right-sided renal arteries are seen.  Incidental note is made of a circumaortic left renal vein.  There is diffuse fatty infiltration within the liver.  The liver is otherwise unremarkable in appearance.  The spleen is within normal limits.  The gallbladder is unremarkable. The adrenal glands are within normal limits.  There is minimal soft tissue inflammation noted about the head and tail of the pancreas, raising question for  minimal pancreatitis. This extends about the second and fourth segments of the duodenum. Associated trace soft tissue stranding is noted extending inferiorly along Gerota's fascia on the left side.  There is no evidence of devascularization or pseudocyst formation at this time.  1.9 cm cyst is noted near the lower pole of the right kidney, similar in appearance to the prior study.  The kidneys are otherwise unremarkable in appearance.  There is no evidence of hydronephrosis.  No renal or ureteral stones are seen. Mild nonspecific perinephric stranding is noted bilaterally.  No free fluid is identified.  The small bowel is unremarkable in appearance.  A tiny hiatal hernia is seen; the stomach is otherwise unremarkable in appearance.  No acute vascular abnormalities are seen.  The appendix is normal in caliber, without evidence for appendicitis.  A trace amount of contrast is noted at the cecum. Diverticulosis is noted along the descending and sigmoid colon, without evidence of diverticulitis.  The bladder is mildly distended and grossly unremarkable in appearance.  The prostate remains normal in size.  No inguinal lymphadenopathy is seen.  No acute osseous abnormalities are identified.  Sclerotic foci within the right ilium are benign in appearance.   Review of the MIP images confirms the above findings.  IMPRESSION:  1.  No evidence of aortic dissection. 2.  Suspect minimal acute pancreatitis, with minimal soft tissue inflammation about the head and tail of the pancreas, extending about the second and fourth segments of the duodenum, and tracking inferiorly along Gerota's fascia on the left side. 3.  Diffuse fatty infiltration within the liver. 4.  Stable right renal cyst. 5.  Tiny hiatal hernia seen. 6.  Diverticulosis along the descending sigmoid colon, without evidence of diverticulitis.  7.  Circumaortic left renal vein incidentally noted.  Original Report Authenticated By: Tonia Ghent, M.D.     Date:  11/11/2011  Rate: 90  Rhythm: normal sinus rhythm  QRS Axis: normal  Intervals: normal  ST/T Wave abnormalities: normal  Conduction Disutrbances:none  Narrative Interpretation:   Old EKG Reviewed: unchanged   1. Pancreatitis   2. Abdominal pain       MDM  56 yo male with intermittent CP and epigastric abd pain that now has been constant x 4 hours. EKG normal, as well as troponin. Abd tender in upper abd, and due to patient's mild HTN, as well as chest pain with being so uncomfortable, a CTA ordered to r/o dissection or aneurysm. CT neg for vascular pathology, but does show pancreatitis, which correlates with elevated lipase. New dx for patient. LFTs negative. Does drink 2-3 drinks of vodka daily. Patient's APACHE II score is 2, indicating patient is low risk for mortality. Due to new diagnosis will admit to [facility] and at this time believe  patient is safe for floor.     Pricilla Loveless, MD 11/11/11 [phone]

## 2011-11-11 NOTE — ED Notes (Addendum)
PT. REPORTS MID CHEST PAIN WITH SOB AND NAUSEA ONSET TODAY . PT. ALSO REPORTS LOW BACK AND MID ABDOMINAL PAIN .

## 2011-11-11 NOTE — ED Notes (Signed)
Pt c/o sudden onset of abdominal and lower rib pain at noon today.  States she pain is aching and stabbing, feels like something is poking him from the inside.  Denies n/v/d.  Abd pain increases with palpation.  SOB, pain upon inspiration.

## 2011-11-12 ENCOUNTER — Inpatient Hospital Stay (HOSPITAL_COMMUNITY): Payer: 59

## 2011-11-12 ENCOUNTER — Encounter (HOSPITAL_COMMUNITY): Payer: Self-pay | Admitting: General Practice

## 2011-11-12 LAB — COMPREHENSIVE METABOLIC PANEL
ALT: 40 U/L (ref 0–53)
Alkaline Phosphatase: 63 U/L (ref 39–117)
Chloride: 103 mEq/L (ref 96–112)
GFR calc Af Amer: >90 mL/min (ref >90)
Glucose, Bld: 94 mg/dL (ref 70–99)
Potassium: 3.8 mEq/L (ref 3.5–5.1)
Sodium: 139 mEq/L (ref 135–145)
Total Bilirubin: 0.5 mg/dL (ref 0.3–1.2)
Total Protein: 6.4 g/dL (ref 6.0–8.3)

## 2011-11-12 LAB — CBC
HCT: 39.7 % (ref 39.0–52.0)
HCT: 43.5 % (ref 39.0–52.0)
MCH: 35.4 pg — ABNORMAL HIGH (ref 26.0–34.0)
MCHC: 35.3 g/dL (ref 30.0–36.0)
MCHC: 34.5 g/dL (ref 30.0–36.0)
MCV: 100.5 fL — ABNORMAL HIGH (ref 78.0–100.0)
MCV: 103.1 fL — ABNORMAL HIGH (ref 78.0–100.0)
Platelets: 133 10*3/uL — ABNORMAL LOW (ref 150–400)
RDW: 12.7 % (ref 11.5–15.5)
RDW: 12.9 % (ref 11.5–15.5)
WBC: 7.4 10*3/uL (ref 4.0–10.5)

## 2011-11-12 LAB — CREATININE, SERUM: GFR calc Af Amer: >90 mL/min (ref >90)

## 2011-11-12 MED ORDER — KCL IN DEXTROSE-NACL 20-5-0.9 MEQ/L-%-% IV SOLN
INTRAVENOUS | Status: DC
  Administered 2011-11-12: 03:00:00 via INTRAVENOUS
  Filled 2011-11-12 (×3): qty 1000

## 2011-11-12 MED ORDER — ONDANSETRON HCL 4 MG/2ML IJ SOLN: 4.0000 mg | Freq: Four times a day (QID) | INTRAMUSCULAR | Status: DC | PRN

## 2011-11-12 MED ORDER — FOLIC ACID 1 MG PO TABS
1.0000 mg | ORAL_TABLET | Freq: Every day | ORAL | Status: DC
  Administered 2011-11-12 – 2011-11-13 (×2): 1 mg via ORAL
  Filled 2011-11-12 (×2): qty 1

## 2011-11-12 MED ORDER — THIAMINE HCL 100 MG/ML IJ SOLN
100.0000 mg | Freq: Every day | INTRAMUSCULAR | Status: DC
  Filled 2011-11-12 (×2): qty 1

## 2011-11-12 MED ORDER — ADULT MULTIVITAMIN W/MINERALS CH
1.0000 | ORAL_TABLET | Freq: Every day | ORAL | Status: DC
  Administered 2011-11-12 – 2011-11-13 (×2): 1 via ORAL
  Filled 2011-11-12 (×2): qty 1

## 2011-11-12 MED ORDER — ADULT MULTIVITAMIN W/MINERALS CH: 1.0000 | ORAL_TABLET | Freq: Every day | ORAL | Status: DC

## 2011-11-12 MED ORDER — HYDROCODONE-ACETAMINOPHEN 5-325 MG PO TABS: 1.0000 | ORAL_TABLET | ORAL | Status: DC | PRN

## 2011-11-12 MED ORDER — LORAZEPAM 1 MG PO TABS: 1.0000 mg | ORAL_TABLET | Freq: Four times a day (QID) | ORAL | Status: DC | PRN

## 2011-11-12 MED ORDER — KCL IN DEXTROSE-NACL 20-5-0.9 MEQ/L-%-% IV SOLN
INTRAVENOUS | Status: DC
  Administered 2011-11-12 – 2011-11-13 (×2): via INTRAVENOUS
  Filled 2011-11-12 (×3): qty 1000

## 2011-11-12 MED ORDER — HYDROMORPHONE HCL PF 1 MG/ML IJ SOLN
2.0000 mg | INTRAMUSCULAR | Status: DC | PRN
  Administered 2011-11-12: 2 mg via INTRAVENOUS
  Filled 2011-11-12: qty 2

## 2011-11-12 MED ORDER — LORAZEPAM 2 MG/ML IJ SOLN: 1.0000 mg | Freq: Four times a day (QID) | INTRAMUSCULAR | Status: DC | PRN

## 2011-11-12 MED ORDER — VITAMIN B-1 100 MG PO TABS
100.0000 mg | ORAL_TABLET | Freq: Every day | ORAL | Status: DC
  Administered 2011-11-12 – 2011-11-13 (×2): 100 mg via ORAL
  Filled 2011-11-12 (×2): qty 1

## 2011-11-12 MED ORDER — MORPHINE SULFATE 2 MG/ML IJ SOLN: 2.0000 mg | INTRAMUSCULAR | Status: DC | PRN

## 2011-11-12 MED ORDER — CHLORDIAZEPOXIDE HCL 10 MG PO CAPS
10.0000 mg | ORAL_CAPSULE | Freq: Three times a day (TID) | ORAL | Status: DC
  Administered 2011-11-12 – 2011-11-13 (×3): 10 mg via ORAL
  Filled 2011-11-12 (×3): qty 1

## 2011-11-12 MED ORDER — LORAZEPAM 2 MG/ML IJ SOLN: 0.0000 mg | Freq: Four times a day (QID) | INTRAMUSCULAR | Status: DC

## 2011-11-12 MED ORDER — LORAZEPAM 2 MG/ML IJ SOLN: 0.0000 mg | Freq: Two times a day (BID) | INTRAMUSCULAR | Status: DC

## 2011-11-12 MED ORDER — ENOXAPARIN SODIUM 40 MG/0.4ML ~~LOC~~ SOLN
40.0000 mg | Freq: Every day | SUBCUTANEOUS | Status: DC
  Administered 2011-11-12: 40 mg via SUBCUTANEOUS
  Filled 2011-11-12 (×2): qty 0.4

## 2011-11-12 NOTE — ED Provider Notes (Signed)
  I performed a history and physical examination of Johny Blamer and discussed his management with Dr. Criss Alvine.  I agree with the history, physical, assessment, and plan of care, with the following exceptions: None  Epigastric abdominal pain radiating to the back and chest over the past several hours. At present constantly over the past 4 hours. No prior history of similar episodes. Has associated nausea but denies vomiting. No specific exacerbating or alleviating measures. He does use alcohol. Has seen a cardiologist 2 years ago oriented stress test. Diffuse epigastric and left upper quadrant abdominal pain. No palpable mass. No hepatosplenomegaly. Chest exam unremarkable. Heart was regular rate and rhythm. Lungs are clear to auscultation bilaterally.  CT scan was performed to rule out aortic pathology. Lipase thousand. Diagnosis acute pancreatitis. Will be admitted to the hospital.  I was present for the following procedures: None Time Spent in Critical Care of the patient: None Time spent in discussions with the patient and family: 10 min  Tildon Husky, MD 11/12/11 [phone]

## 2011-11-12 NOTE — Progress Notes (Signed)
Clinical Social Work Department BRIEF PSYCHOSOCIAL ASSESSMENT 11/12/2011  Patient:  [name], [name]     Account Number:  [id number]     Admit date:  11/11/2011  Clinical Social Worker:  Dennison Bulla  Date/Time:  11/12/2011 02:00 PM  Referred by:  Physician  Date Referred:  11/12/2011 Referred for  Substance Abuse   Other Referral:   Interview type:  Patient Other interview type:    PSYCHOSOCIAL DATA Living Status:  ALONE Admitted from facility:   Level of care:   Primary support name:  Trey Paula Primary support relationship to patient:  SIBLING Degree of support available:   Adqeuate    CURRENT CONCERNS Current Concerns  Substance Abuse   Other Concerns:    SOCIAL WORK ASSESSMENT / PLAN CSW received referral for substance abuse counseling. CSW met with patient alone at bedside. Patient reports that he is drinking on a daily basis due to stress. Patient reports he likes to drink vodka on the rocks and has about 3-4 drinks a day. Patient reports he does not feel intoxicated when drinking this amount. Patient states that he is aware that drinking alcohol can affect his physical health and reports he will quit drinking. CSW spoke about community resources and her ability to refer patient to receive treatment. Patient refused resources. CSW left contact information for patient in case he changes his mind. SBIRT has been completed and is in chart.   Assessment/plan status:  No Further Intervention Required Other assessment/ plan:   Information/referral to community resources:   Patient politely refused    PATIENT'S/FAMILY'S RESPONSE TO PLAN OF CARE: Patient was alert and oriented. Patient reported that he likes to drink but understanding that he needs to reduce or quit drinking in order to feel better physically.

## 2011-11-12 NOTE — Progress Notes (Signed)
Glosser HYQ:[id number] is a 56 y.o. male,  Outpatient Primary MD for the patient is No primary provider on file.  Chief Complaint  Patient presents with  . Chest Pain        Subjective:   [name] today has, No headache, No chest pain, mild improving epigastric/periumbilical abdominal pain - No Nausea, No new weakness tingling or numbness, No Cough - SOB.    Objective:   Filed Vitals:   11/11/11 2215 11/11/11 2315 11/11/11 2351 11/12/11 0628  BP: 144/87 144/94 151/97 124/85  Pulse: 81 84 80 78  Temp:   98.5 F (36.9 C) 98.4 F (36.9 C)  TempSrc:   Oral Oral  Resp: 17 23 18 16   Height:   6\' 2"  (1.88 m)   Weight:   89.268 kg (196 lb 12.8 oz)   SpO2: 94% 94% 93% 92%    Wt Readings from Last 3 Encounters:  11/11/11 89.268 kg (196 lb 12.8 oz)  10/29/09 76.318 kg (168 lb 4 oz)  09/27/09 76.318 kg (168 lb 4 oz)     Intake/Output Summary (Last 24 hours) at 11/12/11 1228 Last data filed at 11/12/11 0600  Gross per 24 hour  Intake 418.75 ml  Output      0 ml  Net 418.75 ml    Exam Awake Alert, Oriented *3, No new F.N deficits, Normal affect Anselmo.AT,PERRAL Supple Neck,No JVD, No cervical lymphadenopathy appriciated.  Symmetrical Chest wall movement, Good air movement bilaterally, CTAB RRR,No Gallops,Rubs or new Murmurs, No Parasternal Heave +ve B.Sounds, Abd Soft, Non tender, No organomegaly appriciated, No rebound -guarding or rigidity. No Cyanosis, Clubbing or edema, No new Rash or bruise   Data Review  CBC  Lab 11/12/11 0645 11/12/11 0044 11/11/11 1937  WBC 8.3 7.4 9.5  HGB 15.0 14.0 16.4  HCT 43.5 39.7 45.8  PLT 133* 132* 148*  MCV 103.1* 100.5* 100.2*  MCH 35.5* 35.4* 35.9*  MCHC 34.5 35.3 35.8  RDW 12.9 12.7 12.6  LYMPHSABS -- -- 1.5  MONOABS -- -- 0.9  EOSABS -- -- 0.1  BASOSABS -- -- 0.0  BANDABS -- -- --    Chemistries   Lab 11/12/11 0645 11/12/11 0044 11/11/11 2046 11/11/11 1937  NA 139 -- -- 134*  K 3.8 -- -- 3.8  CL  103 -- -- 99  CO2 27 -- -- 23  GLUCOSE 94 -- -- 103*  BUN 8 -- -- 8  CREATININE 0.82 0.78 -- 0.75  CALCIUM 9.2 -- -- 10.5  MG -- -- -- --  AST 30 -- 44* --  ALT 40 -- 53 --  ALKPHOS 63 -- 72 --  BILITOT 0.5 -- 0.6 --   ------------------------------------------------------------------------------------------------------------------ estimated creatinine clearance is 119.7 ml/min (by C-G formula based on Cr of 0.82). ------------------------------------------------------------------------------------------------------------------ No results found for this basename: HGBA1C:2 in the last 72 hours ------------------------------------------------------------------------------------------------------------------ No results found for this basename: CHOL:2,HDL:2,LDLCALC:2,TRIG:2,CHOLHDL:2,LDLDIRECT:2 in the last 72 hours ------------------------------------------------------------------------------------------------------------------ No results found for this basename: TSH,T4TOTAL,FREET3,T3FREE,THYROIDAB in the last 72 hours ------------------------------------------------------------------------------------------------------------------ No results found for this basename: VITAMINB12:2,FOLATE:2,FERRITIN:2,TIBC:2,IRON:2,RETICCTPCT:2 in the last 72 hours  Coagulation profile No results found for this basename: INR:5,PROTIME:5 in the last 168 hours  No results found for this basename: DDIMER:2 in the last 72 hours  Cardiac Enzymes No results found for this basename: CK:3,CKMB:3,TROPONINI:3,MYOGLOBIN:3 in the last 168 hours ------------------------------------------------------------------------------------------------------------------ No components found with this basename: POCBNP:3  Micro Results No results found for this or any previous visit (from the past 240 hour(s)).  Radiology Reports Dg  Chest 2 View  11/11/2011  *RADIOLOGY REPORT*  Clinical Data: Chest pain and shortness of breath.   Cough.  COPD. Smoker.  CHEST - 2 VIEW  Comparison: 05/19/2011  Findings: Hyperinflation.  Patient rotated minimally left. Midline trachea.  Normal heart size and mediastinal contours.  Right costophrenic angle is excluded from the frontal. No pleural effusion or pneumothorax.  There is right greater than left biapical pleural thickening.  Suspicion of scarring at the right apex.  Moderate interstitial thickening, without focal consolidation.  IMPRESSION: COPD/chronic bronchitis. No acute superimposed process.  Original Report Authenticated By: Consuello Bossier, M.D.   Ct Angio Chest W/cm &/or Wo Cm  11/11/2011  *RADIOLOGY REPORT*  Clinical Data:  Mid chest pain, shortness breath and nausea; mid abdominal pain and lower back pain.  CT ANGIOGRAPHY CHEST, ABDOMEN AND PELVIS  Technique:  Multidetector CT imaging through the chest, abdomen and pelvis was performed using the standard protocol during bolus administration of intravenous contrast.  Multiplanar reconstructed images including MIPs were obtained and reviewed to evaluate the vascular anatomy.  Contrast: OMNIPAQUE IOHEXOL 350 MG/ML SOLN  Comparison:  CT of the chest, abdomen and pelvis performed 02/21/2009  CTA CHEST  Findings:  There is no evidence of aortic dissection.  There is no evidence for aneurysmal dilatation.  The minimal calcification is noted along the aortic arch and proximal great vessels.  There is no evidence of significant pulmonary embolus.  Diffuse emphysematous change is noted within both lungs, particularly at the upper lung lobes bilaterally.  Bilateral dependent subsegmental atelectasis is noted.  There is no evidence of significant focal consolidation, pleural effusion or pneumothorax.  No masses are identified; no abnormal focal contrast enhancement is seen.  Scattered coronary artery calcifications are noted.  The mediastinum is otherwise unremarkable in appearance.  Scattered mediastinal nodes remain normal in size.  No  pericardial effusion is identified.  No axillary lymphadenopathy is seen.  The thyroid gland is unremarkable in appearance.  No acute osseous abnormalities are seen.   Review of the MIP images confirms the above findings.  IMPRESSION:  1.  No evidence of air dissection. 2.  No evidence of significant pulmonary embolus. 3.  Diffuse emphysema within both lungs, particularly at the upper lung lobes bilaterally. 4.  Bilateral dependent subsegmental atelectasis noted. 5.  Scattered coronary artery calcifications seen.  CTA ABDOMEN AND PELVIS  Findings:  There is no evidence of aortic dissection.  Minimal calcification is noted along the abdominal aorta and its branches. The abdominal aorta is otherwise unremarkable in appearance.  The celiac trunk, superior mesenteric artery, renal arteries and inferior mesenteric artery remain fully patent.  Two right-sided renal arteries are seen.  Incidental note is made of a circumaortic left renal vein.  There is diffuse fatty infiltration within the liver.  The liver is otherwise unremarkable in appearance.  The spleen is within normal limits.  The gallbladder is unremarkable. The adrenal glands are within normal limits.  There is minimal soft tissue inflammation noted about the head and tail of the pancreas, raising question for minimal pancreatitis. This extends about the second and fourth segments of the duodenum. Associated trace soft tissue stranding is noted extending inferiorly along Gerota's fascia on the left side.  There is no evidence of devascularization or pseudocyst formation at this time.  1.9 cm cyst is noted near the lower pole of the right kidney, similar in appearance to the prior study.  The kidneys are otherwise unremarkable in appearance.  There is  no evidence of hydronephrosis.  No renal or ureteral stones are seen. Mild nonspecific perinephric stranding is noted bilaterally.  No free fluid is identified.  The small bowel is unremarkable in appearance.  A  tiny hiatal hernia is seen; the stomach is otherwise unremarkable in appearance.  No acute vascular abnormalities are seen.  The appendix is normal in caliber, without evidence for appendicitis.  A trace amount of contrast is noted at the cecum. Diverticulosis is noted along the descending and sigmoid colon, without evidence of diverticulitis.  The bladder is mildly distended and grossly unremarkable in appearance.  The prostate remains normal in size.  No inguinal lymphadenopathy is seen.  No acute osseous abnormalities are identified.  Sclerotic foci within the right ilium are benign in appearance.   Review of the MIP images confirms the above findings.  IMPRESSION:  1.  No evidence of aortic dissection. 2.  Suspect minimal acute pancreatitis, with minimal soft tissue inflammation about the head and tail of the pancreas, extending about the second and fourth segments of the duodenum, and tracking inferiorly along Gerota's fascia on the left side. 3.  Diffuse fatty infiltration within the liver. 4.  Stable right renal cyst. 5.  Tiny hiatal hernia seen. 6.  Diverticulosis along the descending sigmoid colon, without evidence of diverticulitis.  7.  Circumaortic left renal vein incidentally noted.  Original Report Authenticated By: Tonia Ghent, M.D.   US Abdomen Complete  11/12/2011  *RADIOLOGY REPORT*  Clinical Data:  Pancreatitis.  COMPLETE ABDOMINAL ULTRASOUND  Comparison:  CT 11/11/2011  Findings:  Gallbladder:  No gallstones, gallbladder wall thickening, or pericholecystic fluid.  Common bile duct:   Normal caliber, 4 mm  Liver:  Diffuse increased echogenicity throughout the liver compatible with fatty infiltration.  No focal abnormality.  IVC:  Visualized portions unremarkable.  Pancreas:  No focal abnormality seen.  Spleen:  Within normal limits in size and echotexture.  Right Kidney:   2.2 cm cyst in the midpole region.  No hydronephrosis.  Normal echotexture and size.  Left Kidney:  Normal in size and  parenchymal echogenicity.  No evidence of mass or hydronephrosis.  Abdominal aorta:  No aneurysm identified.  IMPRESSION: Diffuse fatty infiltration of the liver.  No acute findings.  Original Report Authenticated By: Cyndie Chime, M.D.   Ct Angio Abd/pel W/ And/or W/o  11/11/2011  *RADIOLOGY REPORT*  Clinical Data:  Mid chest pain, shortness breath and nausea; mid abdominal pain and lower back pain.  CT ANGIOGRAPHY CHEST, ABDOMEN AND PELVIS  Technique:  Multidetector CT imaging through the chest, abdomen and pelvis was performed using the standard protocol during bolus administration of intravenous contrast.  Multiplanar reconstructed images including MIPs were obtained and reviewed to evaluate the vascular anatomy.  Contrast: OMNIPAQUE IOHEXOL 350 MG/ML SOLN  Comparison:  CT of the chest, abdomen and pelvis performed 02/21/2009  CTA CHEST  Findings:  There is no evidence of aortic dissection.  There is no evidence for aneurysmal dilatation.  The minimal calcification is noted along the aortic arch and proximal great vessels.  There is no evidence of significant pulmonary embolus.  Diffuse emphysematous change is noted within both lungs, particularly at the upper lung lobes bilaterally.  Bilateral dependent subsegmental atelectasis is noted.  There is no evidence of significant focal consolidation, pleural effusion or pneumothorax.  No masses are identified; no abnormal focal contrast enhancement is seen.  Scattered coronary artery calcifications are noted.  The mediastinum is otherwise unremarkable in appearance.  Scattered  mediastinal nodes remain normal in size.  No pericardial effusion is identified.  No axillary lymphadenopathy is seen.  The thyroid gland is unremarkable in appearance.  No acute osseous abnormalities are seen.   Review of the MIP images confirms the above findings.  IMPRESSION:  1.  No evidence of air dissection. 2.  No evidence of significant pulmonary embolus. 3.  Diffuse emphysema  within both lungs, particularly at the upper lung lobes bilaterally. 4.  Bilateral dependent subsegmental atelectasis noted. 5.  Scattered coronary artery calcifications seen.  CTA ABDOMEN AND PELVIS  Findings:  There is no evidence of aortic dissection.  Minimal calcification is noted along the abdominal aorta and its branches. The abdominal aorta is otherwise unremarkable in appearance.  The celiac trunk, superior mesenteric artery, renal arteries and inferior mesenteric artery remain fully patent.  Two right-sided renal arteries are seen.  Incidental note is made of a circumaortic left renal vein.  There is diffuse fatty infiltration within the liver.  The liver is otherwise unremarkable in appearance.  The spleen is within normal limits.  The gallbladder is unremarkable. The adrenal glands are within normal limits.  There is minimal soft tissue inflammation noted about the head and tail of the pancreas, raising question for minimal pancreatitis. This extends about the second and fourth segments of the duodenum. Associated trace soft tissue stranding is noted extending inferiorly along Gerota's fascia on the left side.  There is no evidence of devascularization or pseudocyst formation at this time.  1.9 cm cyst is noted near the lower pole of the right kidney, similar in appearance to the prior study.  The kidneys are otherwise unremarkable in appearance.  There is no evidence of hydronephrosis.  No renal or ureteral stones are seen. Mild nonspecific perinephric stranding is noted bilaterally.  No free fluid is identified.  The small bowel is unremarkable in appearance.  A tiny hiatal hernia is seen; the stomach is otherwise unremarkable in appearance.  No acute vascular abnormalities are seen.  The appendix is normal in caliber, without evidence for appendicitis.  A trace amount of contrast is noted at the cecum. Diverticulosis is noted along the descending and sigmoid colon, without evidence of diverticulitis.   The bladder is mildly distended and grossly unremarkable in appearance.  The prostate remains normal in size.  No inguinal lymphadenopathy is seen.  No acute osseous abnormalities are identified.  Sclerotic foci within the right ilium are benign in appearance.   Review of the MIP images confirms the above findings.  IMPRESSION:  1.  No evidence of aortic dissection. 2.  Suspect minimal acute pancreatitis, with minimal soft tissue inflammation about the head and tail of the pancreas, extending about the second and fourth segments of the duodenum, and tracking inferiorly along Gerota's fascia on the left side. 3.  Diffuse fatty infiltration within the liver. 4.  Stable right renal cyst. 5.  Tiny hiatal hernia seen. 6.  Diverticulosis along the descending sigmoid colon, without evidence of diverticulitis.  7.  Circumaortic left renal vein incidentally noted.  Original Report Authenticated By: Tonia Ghent, M.D.    Scheduled Meds:   . chlordiazePOXIDE  10 mg Oral TID  . enoxaparin  40 mg Subcutaneous Daily  . folic acid  1 mg Oral Daily  . LORazepam  0-4 mg Intravenous Q6H   Followed by  . LORazepam  0-4 mg Intravenous Q12H  .  morphine injection  4 mg Intravenous Once  .  morphine injection  4 mg Intravenous  Once  . mulitivitamin with minerals  1 tablet Oral Daily  . ondansetron (ZOFRAN) IV  4 mg Intravenous Once  . sodium chloride  1,000 mL Intravenous Once  . thiamine  100 mg Oral Daily   Or  . thiamine  100 mg Intravenous Daily  . DISCONTD: mulitivitamin with minerals  1 tablet Oral Daily   Continuous Infusions:   . dextrose 5 % and 0.9 % NaCl with KCl 20 mEq/L    . DISCONTD: dextrose 5 % and 0.9 % NaCl with KCl 20 mEq/L 125 mL/hr at 11/12/11 0239   PRN Meds:.HYDROcodone-acetaminophen, HYDROmorphone, iohexol, LORazepam, LORazepam, morphine injection, ondansetron (ZOFRAN) IV  Assessment & Plan    #1 Acute pancreatitis:  likely alcoholic pancreatitis. Continue bowel rest, we'll put him  only on clears for now, abdominal pain much better, counseled to quit alcohol, continue CIWA protocol and Librium scheduled along with vitamin supplementation and IV fluids.right upper quadrant ultrasound stable except for fatty liver.   #2 Alcoholism: As #1   #3 tobacco abuse: again he smokes about a pack a day. I will give him a nicotine patch- patient counseled personally to quit smoking and alcohol.    #4 thrombocytopenia: more than likely from his alcoholism. I discussed also with him the need to quit alcohol if possible   #5. Right upper quadrant ultrasound suggestive of fatty liver. Outpatient follow up.   DVT Prophylaxis  Lovenox        [name] M.D on 11/12/2011 at 12:28 PM  [facility] Office  [phone]

## 2011-11-13 LAB — COMPREHENSIVE METABOLIC PANEL
BUN: 6 mg/dL (ref 6–23)
Calcium: 9.1 mg/dL (ref 8.4–10.5)
GFR calc Af Amer: >90 mL/min (ref >90)
Glucose, Bld: 113 mg/dL — ABNORMAL HIGH (ref 70–99)
Sodium: 135 mEq/L (ref 135–145)
Total Protein: 6.4 g/dL (ref 6.0–8.3)

## 2011-11-13 LAB — LIPID PANEL
Cholesterol: 180 mg/dL (ref 0–200)
HDL: 62 mg/dL (ref >39)
Total CHOL/HDL Ratio: 2.9 RATIO

## 2011-11-13 MED ORDER — FOLIC ACID 1 MG PO TABS: 1.0000 mg | ORAL_TABLET | Freq: Every day | ORAL | Status: AC

## 2011-11-13 MED ORDER — THIAMINE HCL 100 MG PO TABS: 100.0000 mg | ORAL_TABLET | Freq: Every day | ORAL | Status: AC

## 2011-11-13 MED ORDER — OXAZEPAM 10 MG PO CAPS: ORAL_CAPSULE | ORAL | Status: DC

## 2011-11-13 MED ORDER — HYDROCODONE-ACETAMINOPHEN 5-500 MG PO TABS: 1.0000 | ORAL_TABLET | Freq: Four times a day (QID) | ORAL | Status: AC | PRN

## 2011-11-13 NOTE — Progress Notes (Signed)
Patient discharged to home in care of self. Medications and instructions reviewed with patient with no questions. Diet for pancreatitis discussed with patient and handouts sent home. IV d/c'd with cath intact. Copy of d/c instructions given to patient to give to PCP. Patient is to follow up with PCP in 4 days for blood work and with GI MD in 1 week.

## 2011-11-13 NOTE — Discharge Summary (Signed)
[name], 56 y.o., DOB [birth year], MRN [id number]. Admission date: 11/11/2011 Discharge Date 11/13/2011 Primary MD No primary provider on file. Admitting Physician Rometta Emery, MD  Admission Diagnosis  Abdominal pain [789.0] Pancreatitis [577.0] cp  Discharge Diagnosis   Principal Problem:  *Pancreatitis, alcoholic Active Problems:  Alcoholism  Tobacco abuse  Thrombocytopenia     Past Medical History  Diagnosis Date  . COPD (chronic obstructive pulmonary disease)   . Pneumonia   . Pneumothorax   . Hypertension   . Shortness of breath     Past Surgical History  Procedure Date  . Thoracotomy      Hospital Course See H&P, Labs, Consult and Test reports for all details in brief, patient was admitted for  Abd Pain due to Alcoholic Pancreatitis, much improved post Bowel rest, now pain free, tolerating diet, counseled to quit smoking and Alcohol, did not want S Work help which was offered, outpt GI follow for Fatty Liver on CT-US, Chr Thrombocytopenia and outpt Chol monitoring.   Significant Tests:  See full reports for all details      Dg Chest 2 View  11/11/2011  *RADIOLOGY REPORT*  Clinical Data: Chest pain and shortness of breath.  Cough.  COPD. Smoker.  CHEST - 2 VIEW  Comparison: 05/19/2011  Findings: Hyperinflation.  Patient rotated minimally left. Midline trachea.  Normal heart size and mediastinal contours.  Right costophrenic angle is excluded from the frontal. No pleural effusion or pneumothorax.  There is right greater than left biapical pleural thickening.  Suspicion of scarring at the right apex.  Moderate interstitial thickening, without focal consolidation.  IMPRESSION: COPD/chronic bronchitis. No acute superimposed process.  Original Report Authenticated By: Consuello Bossier, M.D.   Ct Angio Chest W/cm &/or Wo Cm  11/11/2011  *RADIOLOGY REPORT*  Clinical Data:  Mid chest pain, shortness breath and nausea; mid abdominal pain and lower back pain.  CT  ANGIOGRAPHY CHEST, ABDOMEN AND PELVIS  Technique:  Multidetector CT imaging through the chest, abdomen and pelvis was performed using the standard protocol during bolus administration of intravenous contrast.  Multiplanar reconstructed images including MIPs were obtained and reviewed to evaluate the vascular anatomy.  Contrast: OMNIPAQUE IOHEXOL 350 MG/ML SOLN  Comparison:  CT of the chest, abdomen and pelvis performed 02/21/2009  CTA CHEST  Findings:  There is no evidence of aortic dissection.  There is no evidence for aneurysmal dilatation.  The minimal calcification is noted along the aortic arch and proximal great vessels.  There is no evidence of significant pulmonary embolus.  Diffuse emphysematous change is noted within both lungs, particularly at the upper lung lobes bilaterally.  Bilateral dependent subsegmental atelectasis is noted.  There is no evidence of significant focal consolidation, pleural effusion or pneumothorax.  No masses are identified; no abnormal focal contrast enhancement is seen.  Scattered coronary artery calcifications are noted.  The mediastinum is otherwise unremarkable in appearance.  Scattered mediastinal nodes remain normal in size.  No pericardial effusion is identified.  No axillary lymphadenopathy is seen.  The thyroid gland is unremarkable in appearance.  No acute osseous abnormalities are seen.   Review of the MIP images confirms the above findings.  IMPRESSION:  1.  No evidence of air dissection. 2.  No evidence of significant pulmonary embolus. 3.  Diffuse emphysema within both lungs, particularly at the upper lung lobes bilaterally. 4.  Bilateral dependent subsegmental atelectasis noted. 5.  Scattered coronary artery calcifications seen.  CTA ABDOMEN AND PELVIS  Findings:  There is no evidence of aortic dissection.  Minimal calcification is noted along the abdominal aorta and its branches. The abdominal aorta is otherwise unremarkable in appearance.  The celiac trunk,  superior mesenteric artery, renal arteries and inferior mesenteric artery remain fully patent.  Two right-sided renal arteries are seen.  Incidental note is made of a circumaortic left renal vein.  There is diffuse fatty infiltration within the liver.  The liver is otherwise unremarkable in appearance.  The spleen is within normal limits.  The gallbladder is unremarkable. The adrenal glands are within normal limits.  There is minimal soft tissue inflammation noted about the head and tail of the pancreas, raising question for minimal pancreatitis. This extends about the second and fourth segments of the duodenum. Associated trace soft tissue stranding is noted extending inferiorly along Gerota's fascia on the left side.  There is no evidence of devascularization or pseudocyst formation at this time.  1.9 cm cyst is noted near the lower pole of the right kidney, similar in appearance to the prior study.  The kidneys are otherwise unremarkable in appearance.  There is no evidence of hydronephrosis.  No renal or ureteral stones are seen. Mild nonspecific perinephric stranding is noted bilaterally.  No free fluid is identified.  The small bowel is unremarkable in appearance.  A tiny hiatal hernia is seen; the stomach is otherwise unremarkable in appearance.  No acute vascular abnormalities are seen.  The appendix is normal in caliber, without evidence for appendicitis.  A trace amount of contrast is noted at the cecum. Diverticulosis is noted along the descending and sigmoid colon, without evidence of diverticulitis.  The bladder is mildly distended and grossly unremarkable in appearance.  The prostate remains normal in size.  No inguinal lymphadenopathy is seen.  No acute osseous abnormalities are identified.  Sclerotic foci within the right ilium are benign in appearance.   Review of the MIP images confirms the above findings.  IMPRESSION:  1.  No evidence of aortic dissection. 2.  Suspect minimal acute pancreatitis,  with minimal soft tissue inflammation about the head and tail of the pancreas, extending about the second and fourth segments of the duodenum, and tracking inferiorly along Gerota's fascia on the left side. 3.  Diffuse fatty infiltration within the liver. 4.  Stable right renal cyst. 5.  Tiny hiatal hernia seen. 6.  Diverticulosis along the descending sigmoid colon, without evidence of diverticulitis.  7.  Circumaortic left renal vein incidentally noted.  Original Report Authenticated By: Tonia Ghent, M.D.   US Abdomen Complete  11/12/2011  *RADIOLOGY REPORT*  Clinical Data:  Pancreatitis.  COMPLETE ABDOMINAL ULTRASOUND  Comparison:  CT 11/11/2011  Findings:  Gallbladder:  No gallstones, gallbladder wall thickening, or pericholecystic fluid.  Common bile duct:   Normal caliber, 4 mm  Liver:  Diffuse increased echogenicity throughout the liver compatible with fatty infiltration.  No focal abnormality.  IVC:  Visualized portions unremarkable.  Pancreas:  No focal abnormality seen.  Spleen:  Within normal limits in size and echotexture.  Right Kidney:   2.2 cm cyst in the midpole region.  No hydronephrosis.  Normal echotexture and size.  Left Kidney:  Normal in size and parenchymal echogenicity.  No evidence of mass or hydronephrosis.  Abdominal aorta:  No aneurysm identified.  IMPRESSION: Diffuse fatty infiltration of the liver.  No acute findings.  Original Report Authenticated By: Cyndie Chime, M.D.   Ct Angio Abd/pel W/ And/or W/o  11/11/2011  *RADIOLOGY REPORT*  Clinical Data:  Mid chest pain, shortness breath and nausea; mid abdominal pain and lower back pain.  CT ANGIOGRAPHY CHEST, ABDOMEN AND PELVIS  Technique:  Multidetector CT imaging through the chest, abdomen and pelvis was performed using the standard protocol during bolus administration of intravenous contrast.  Multiplanar reconstructed images including MIPs were obtained and reviewed to evaluate the vascular anatomy.  Contrast: OMNIPAQUE  IOHEXOL 350 MG/ML SOLN  Comparison:  CT of the chest, abdomen and pelvis performed 02/21/2009  CTA CHEST  Findings:  There is no evidence of aortic dissection.  There is no evidence for aneurysmal dilatation.  The minimal calcification is noted along the aortic arch and proximal great vessels.  There is no evidence of significant pulmonary embolus.  Diffuse emphysematous change is noted within both lungs, particularly at the upper lung lobes bilaterally.  Bilateral dependent subsegmental atelectasis is noted.  There is no evidence of significant focal consolidation, pleural effusion or pneumothorax.  No masses are identified; no abnormal focal contrast enhancement is seen.  Scattered coronary artery calcifications are noted.  The mediastinum is otherwise unremarkable in appearance.  Scattered mediastinal nodes remain normal in size.  No pericardial effusion is identified.  No axillary lymphadenopathy is seen.  The thyroid gland is unremarkable in appearance.  No acute osseous abnormalities are seen.   Review of the MIP images confirms the above findings.  IMPRESSION:  1.  No evidence of air dissection. 2.  No evidence of significant pulmonary embolus. 3.  Diffuse emphysema within both lungs, particularly at the upper lung lobes bilaterally. 4.  Bilateral dependent subsegmental atelectasis noted. 5.  Scattered coronary artery calcifications seen.  CTA ABDOMEN AND PELVIS  Findings:  There is no evidence of aortic dissection.  Minimal calcification is noted along the abdominal aorta and its branches. The abdominal aorta is otherwise unremarkable in appearance.  The celiac trunk, superior mesenteric artery, renal arteries and inferior mesenteric artery remain fully patent.  Two right-sided renal arteries are seen.  Incidental note is made of a circumaortic left renal vein.  There is diffuse fatty infiltration within the liver.  The liver is otherwise unremarkable in appearance.  The spleen is within normal limits.  The  gallbladder is unremarkable. The adrenal glands are within normal limits.  There is minimal soft tissue inflammation noted about the head and tail of the pancreas, raising question for minimal pancreatitis. This extends about the second and fourth segments of the duodenum. Associated trace soft tissue stranding is noted extending inferiorly along Gerota's fascia on the left side.  There is no evidence of devascularization or pseudocyst formation at this time.  1.9 cm cyst is noted near the lower pole of the right kidney, similar in appearance to the prior study.  The kidneys are otherwise unremarkable in appearance.  There is no evidence of hydronephrosis.  No renal or ureteral stones are seen. Mild nonspecific perinephric stranding is noted bilaterally.  No free fluid is identified.  The small bowel is unremarkable in appearance.  A tiny hiatal hernia is seen; the stomach is otherwise unremarkable in appearance.  No acute vascular abnormalities are seen.  The appendix is normal in caliber, without evidence for appendicitis.  A trace amount of contrast is noted at the cecum. Diverticulosis is noted along the descending and sigmoid colon, without evidence of diverticulitis.  The bladder is mildly distended and grossly unremarkable in appearance.  The prostate remains normal in size.  No inguinal lymphadenopathy is seen.  No acute osseous abnormalities are identified.  Sclerotic foci within the right ilium are benign in appearance.   Review of the MIP images confirms the above findings.  IMPRESSION:  1.  No evidence of aortic dissection. 2.  Suspect minimal acute pancreatitis, with minimal soft tissue inflammation about the head and tail of the pancreas, extending about the second and fourth segments of the duodenum, and tracking inferiorly along Gerota's fascia on the left side. 3.  Diffuse fatty infiltration within the liver. 4.  Stable right renal cyst. 5.  Tiny hiatal hernia seen. 6.  Diverticulosis along the  descending sigmoid colon, without evidence of diverticulitis.  7.  Circumaortic left renal vein incidentally noted.  Original Report Authenticated By: Tonia Ghent, M.D.     Today   Subjective:   [name] today has no headache,no chest abdominal pain,no new weakness tingling or numbness, feels much better wants to go home today.    Objective:   Blood pressure 150/92, pulse 79, temperature 98.2 F (36.8 C), temperature source Oral, resp. rate 18, height 6\' 2"  (1.88 m), weight 89.268 kg (196 lb 12.8 oz), SpO2 92.00%.  Intake/Output Summary (Last 24 hours) at 11/13/11 1610 Last data filed at 11/13/11 0540  Gross per 24 hour  Intake   1225 ml  Output      0 ml  Net   1225 ml    Exam Awake Alert, Oriented *3, No new F.N deficits, Normal affect [place].AT,PERRAL Supple Neck,No JVD, No cervical lymphadenopathy appriciated.  Symmetrical Chest wall movement, Good air movement bilaterally, CTAB RRR,No Gallops,Rubs or new Murmurs, No Parasternal Heave +ve B.Sounds, Abd Soft, Non tender, No organomegaly appriciated, No rebound -guarding or rigidity. No Cyanosis, Clubbing or edema, No new Rash or bruise  Data Review     Lab Results  Component Value Date   CHOL 180 11/13/2011   HDL 62 11/13/2011   LDLCALC 99 11/13/2011   TRIG 95 11/13/2011   CHOLHDL 2.9 11/13/2011    CBC w Diff: Lab Results  Component Value Date   WBC 8.3 11/12/2011   HGB 15.0 11/12/2011   HCT 43.5 11/12/2011   PLT 133* 11/12/2011   LYMPHOPCT 16 11/11/2011   MONOPCT 10 11/11/2011   EOSPCT 1 11/11/2011   BASOPCT 0 11/11/2011   CMP: Lab Results  Component Value Date   NA 135 11/13/2011   K 3.8 11/13/2011   CL 102 11/13/2011   CO2 23 11/13/2011   BUN 6 11/13/2011   CREATININE 0.75 11/13/2011   PROT 6.4 11/13/2011   ALBUMIN 3.0* 11/13/2011   BILITOT 0.6 11/13/2011   ALKPHOS 66 11/13/2011   AST 24 11/13/2011   ALT 30 11/13/2011  .  Lab Results  Component Value Date   LIPASE 567* 11/12/2011     Discharge Instructions       Follow with Primary MD No primary provider on file. in 4 days   Get CBC, CMP, checked 4 days by Primary MD and again as instructed by your Primary MD.   Get Medicines reviewed and adjusted.  Please request your Prim.MD to go over all Hospital Tests and Procedure/Radiological results at the follow up, please get all Hospital records sent to your Prim MD by signing hospital release before you go home.  Activity: Fall precautions use walker/cane & assistance as needed  Diet: Heart Healthy  For Heart failure patients - Check your Weight same time everyday, if you gain over 2 pounds, or you develop in leg swelling, experience more shortness of breath or chest pain, call your  Primary MD immediately. Follow Cardiac Low Salt Diet and 1.8 lit/day fluid restriction.  Disposition Home  If you experience worsening of your admission symptoms, develop shortness of breath, life threatening emergency, suicidal or homicidal thoughts you must seek medical attention immediately by calling 911 or calling your MD immediately  if symptoms less severe.  You Must read complete instructions/literature along with all the possible adverse reactions/side effects for all the Medicines you take and that have been prescribed to you. Take any new Medicines after you have completely understood and accpet all the possible adverse reactions/side effects.   Do not drive if your were admitted for syncope or siezures until you have seen by Primary MD or a Neurologist and advised to drive.  Do not drive when taking Pain medications.    Do not take more than prescribed Pain, Sleep and Anxiety Medications  Special Instructions: If you have smoked or chewed Tobacco  in the last 2 yrs please stop smoking, stop any regular Alcohol  and or any Recreational drug use.Join local AA if needed.  Wear Seat belts while driving.  Follow-up Information    Follow up with Primary MD. Schedule an appointment as soon as possible for a  visit in 4 days.      Follow up with PYRTLE, Carie Caddy, MD. Schedule an appointment as soon as possible for a visit in 1 week.   Contact information:   [address] [place] [address] [phone]          Discharge Medications   Medication List  As of 11/13/2011  8:22 AM   START taking these medications         folic acid 1 MG tablet   Commonly known as: FOLVITE   Take 1 tablet (1 mg total) by mouth daily.      HYDROcodone-acetaminophen 5-500 MG per tablet   Commonly known as: VICODIN   Take 1 tablet by mouth every 6 (six) hours as needed for pain.      oxazepam 10 MG capsule   Commonly known as: SERAX   Please dispense 18 pills - Take 1 pill three times a day for 3 days, then Take 1 pill two times a day for 3 days, then Take 1 pill once a day for 3 days and stop.      thiamine 100 MG tablet   Take 1 tablet (100 mg total) by mouth daily.         CONTINUE taking these medications         mulitivitamin with minerals Tabs          Where to get your medications    These are the prescriptions that you need to pick up.   You may get these medications from any pharmacy.         folic acid 1 MG tablet   HYDROcodone-acetaminophen 5-500 MG per tablet   oxazepam 10 MG capsule   thiamine 100 MG tablet             Total Time in preparing paper work, data evaluation and todays exam - 35 minutes  Leroy Sea M.D on 11/13/2011 at 8:22 AM  [facility] Office  [phone]

## 2011-11-13 NOTE — Discharge Instructions (Signed)
Follow with Primary MD No primary provider on file. in 4 days   Get CBC, CMP, checked 4 days by Primary MD and again as instructed by your Primary MD.   Get Medicines reviewed and adjusted.  Please request your Prim.MD to go over all Hospital Tests and Procedure/Radiological results at the follow up, please get all Hospital records sent to your Prim MD by signing hospital release before you go home.  Follow-up Information    Follow up with Primary MD. Schedule an appointment as soon as possible for a visit in 4 days.      Follow up with PYRTLE, Carie Caddy, MD. Schedule an appointment as soon as possible for a visit in 1 week.   Contact information:   [address] [place] [address] [phone]          Activity: Fall precautions use walker/cane & assistance as needed  Diet: Heart Healthy  For Heart failure patients - Check your Weight same time everyday, if you gain over 2 pounds, or you develop in leg swelling, experience more shortness of breath or chest pain, call your Primary MD immediately. Follow Cardiac Low Salt Diet and 1.8 lit/day fluid restriction.  Disposition Home  If you experience worsening of your admission symptoms, develop shortness of breath, life threatening emergency, suicidal or homicidal thoughts you must seek medical attention immediately by calling 911 or calling your MD immediately  if symptoms less severe.  You Must read complete instructions/literature along with all the possible adverse reactions/side effects for all the Medicines you take and that have been prescribed to you. Take any new Medicines after you have completely understood and accpet all the possible adverse reactions/side effects.   Do not drive if your were admitted for syncope or siezures until you have seen by Primary MD or a Neurologist and advised to drive.  Do not drive when taking Pain medications.    Do not take more than prescribed Pain, Sleep and Anxiety  Medications  Special Instructions: If you have smoked or chewed Tobacco  in the last 2 yrs please stop smoking, stop any regular Alcohol  and or any Recreational drug use.Join local AA if needed.  Wear Seat belts while driving.

## 2012-01-29 ENCOUNTER — Inpatient Hospital Stay (HOSPITAL_COMMUNITY)
Admission: EM | Admit: 2012-01-29 | Discharge: 2012-02-01 | DRG: 440 | Disposition: A | Discharge status: Home Health | Payer: 59 | Attending: Family Medicine | Admitting: Family Medicine

## 2012-01-29 ENCOUNTER — Emergency Department (HOSPITAL_COMMUNITY): Payer: 59

## 2012-01-29 ENCOUNTER — Encounter (HOSPITAL_COMMUNITY): Payer: Self-pay | Admitting: *Deleted

## 2012-01-29 DIAGNOSIS — R748 Abnormal levels of other serum enzymes: Secondary | ICD-10-CM

## 2012-01-29 DIAGNOSIS — K7010 Alcoholic hepatitis without ascites: Secondary | ICD-10-CM | POA: Diagnosis present

## 2012-01-29 DIAGNOSIS — J449 Chronic obstructive pulmonary disease, unspecified: Secondary | ICD-10-CM | POA: Diagnosis present

## 2012-01-29 DIAGNOSIS — F1020 Alcohol dependence, uncomplicated: Secondary | ICD-10-CM | POA: Diagnosis present

## 2012-01-29 DIAGNOSIS — D6959 Other secondary thrombocytopenia: Secondary | ICD-10-CM | POA: Diagnosis present

## 2012-01-29 DIAGNOSIS — K859 Acute pancreatitis without necrosis or infection, unspecified: Secondary | ICD-10-CM

## 2012-01-29 DIAGNOSIS — F17200 Nicotine dependence, unspecified, uncomplicated: Secondary | ICD-10-CM | POA: Diagnosis present

## 2012-01-29 DIAGNOSIS — Z8701 Personal history of pneumonia (recurrent): Secondary | ICD-10-CM

## 2012-01-29 DIAGNOSIS — J4489 Other specified chronic obstructive pulmonary disease: Secondary | ICD-10-CM | POA: Diagnosis present

## 2012-01-29 DIAGNOSIS — I10 Essential (primary) hypertension: Secondary | ICD-10-CM | POA: Diagnosis present

## 2012-01-29 LAB — CBC WITH DIFFERENTIAL/PLATELET
Basophils Absolute: 0 10*3/uL (ref 0.0–0.1)
Basophils Relative: 0 % (ref 0–1)
Eosinophils Absolute: 0.1 10*3/uL (ref 0.0–0.7)
Eosinophils Relative: 1 % (ref 0–5)
HCT: 44.8 % (ref 39.0–52.0)
Lymphocytes Relative: 17 % (ref 12–46)
MCH: 35.3 pg — ABNORMAL HIGH (ref 26.0–34.0)
MCHC: 35 g/dL (ref 30.0–36.0)
MCV: 100.7 fL — ABNORMAL HIGH (ref 78.0–100.0)
Monocytes Absolute: 0.6 10*3/uL (ref 0.1–1.0)
RDW: 13.1 % (ref 11.5–15.5)

## 2012-01-29 LAB — COMPREHENSIVE METABOLIC PANEL
AST: 75 U/L — ABNORMAL HIGH (ref 0–37)
CO2: 22 mEq/L (ref 19–32)
Calcium: 10.2 mg/dL (ref 8.4–10.5)
Creatinine, Ser: 0.73 mg/dL (ref 0.50–1.35)
GFR calc non Af Amer: >90 mL/min (ref >90)

## 2012-01-29 MED ORDER — ONDANSETRON HCL 4 MG/2ML IJ SOLN
4.0000 mg | Freq: Once | INTRAMUSCULAR | Status: AC
  Administered 2012-01-29: 4 mg via INTRAVENOUS
  Filled 2012-01-29: qty 2

## 2012-01-29 MED ORDER — SODIUM CHLORIDE 0.9 % IV SOLN
Freq: Once | INTRAVENOUS | Status: AC
  Administered 2012-01-29: 20:00:00 via INTRAVENOUS

## 2012-01-29 MED ORDER — HYDROMORPHONE HCL PF 1 MG/ML IJ SOLN
1.0000 mg | INTRAMUSCULAR | Status: DC | PRN
  Administered 2012-01-29 – 2012-01-30 (×3): 1 mg via INTRAVENOUS
  Filled 2012-01-29 (×3): qty 1

## 2012-01-29 MED ORDER — HYDROMORPHONE HCL PF 1 MG/ML IJ SOLN
1.0000 mg | Freq: Once | INTRAMUSCULAR | Status: AC
  Administered 2012-01-29: 1 mg via INTRAVENOUS
  Filled 2012-01-29: qty 1

## 2012-01-29 MED ORDER — SODIUM CHLORIDE 0.9 % IV SOLN
INTRAVENOUS | Status: AC
  Administered 2012-01-30: via INTRAVENOUS

## 2012-01-29 MED ORDER — ONDANSETRON HCL 4 MG/2ML IJ SOLN: 4.0000 mg | Freq: Three times a day (TID) | INTRAMUSCULAR | Status: DC | PRN

## 2012-01-29 MED ORDER — LORAZEPAM 2 MG/ML IJ SOLN
1.0000 mg | Freq: Once | INTRAMUSCULAR | Status: AC
  Administered 2012-01-29: 1 mg via INTRAVENOUS
  Filled 2012-01-29: qty 1

## 2012-01-29 MED ORDER — SODIUM CHLORIDE 0.9 % IV BOLUS (SEPSIS)
1000.0000 mL | Freq: Once | INTRAVENOUS | Status: AC
  Administered 2012-01-29: 1000 mL via INTRAVENOUS

## 2012-01-29 MED ORDER — HYDROMORPHONE HCL PF 1 MG/ML IJ SOLN
1.0000 mg | INTRAMUSCULAR | Status: AC | PRN
  Administered 2012-01-30: 1 mg via INTRAVENOUS
  Filled 2012-01-29: qty 1

## 2012-01-29 MED ORDER — VITAMIN B-1 100 MG PO TABS
100.0000 mg | ORAL_TABLET | Freq: Every day | ORAL | Status: DC
  Administered 2012-01-30 – 2012-02-01 (×3): 100 mg via ORAL
  Filled 2012-01-29 (×3): qty 1

## 2012-01-29 MED ORDER — PANTOPRAZOLE SODIUM 40 MG IV SOLR
40.0000 mg | Freq: Once | INTRAVENOUS | Status: AC
  Administered 2012-01-29: 40 mg via INTRAVENOUS
  Filled 2012-01-29: qty 40

## 2012-01-29 MED ORDER — ONDANSETRON HCL 4 MG/2ML IJ SOLN
4.0000 mg | Freq: Four times a day (QID) | INTRAMUSCULAR | Status: DC | PRN
  Administered 2012-01-30 – 2012-01-31 (×5): 4 mg via INTRAVENOUS
  Filled 2012-01-29 (×5): qty 2

## 2012-01-29 NOTE — ED Notes (Signed)
Attempted to call rerport to department 300.

## 2012-01-29 NOTE — ED Notes (Signed)
Pt c/o chest pain radiating through to his back and abdominal pain since 12:30. Pt also c/o nausea. Pt states that it feels like when he had pancreatitis.

## 2012-01-29 NOTE — ED Notes (Signed)
Pt de sated into low 80's and maintained for about 1-2 minutes. O2 at 2L applied. O2 sat back up to 91-92%.

## 2012-01-29 NOTE — ED Provider Notes (Signed)
History    This chart was scribed for Dione Booze, MD, MD by Smitty Pluck. The patient was seen in room APA17 and the patient's care was started at 6:32PM.   CSN: [id number]  Arrival date & time 01/29/12  1802   First MD Initiated Contact with Patient 01/29/12 1829      Chief Complaint  Patient presents with  . Chest Pain    (Consider location/radiation/quality/duration/timing/severity/associated sxs/prior treatment) Patient is a 56 y.o. male presenting with chest pain. The history is provided by the patient.  Chest Pain    [name] is a 56 y.o. male who presents to the Emergency Department complaining of constant moderate chest pain in lower sternal area with radiation to back. Pt reports moderate pain in epigastric and mid abdomen. Onset today 6 hours ago. Pain is 10/10. Pt reports taking folic acid and hydrocodone today at 3PM with minor relief. Pt reports SOB and nausea. Pt has hx of pancreatitis and states this feels similar. Pt was told drinking was cause of past pancreatitis and smoking aggravated the pain. Pt reports that he has had 4 drinks last night. Pt reports smoking pack/day.  PCP is Dr. Sudie Bailey   Past Medical History  Diagnosis Date  . COPD (chronic obstructive pulmonary disease)   . Pneumonia   . Pneumothorax   . Hypertension   . Shortness of breath   . Pancreatitis     Past Surgical History  Procedure Date  . Thoracotomy     History reviewed. No pertinent family history.  History  Substance Use Topics  . Smoking status: Current Everyday Smoker -- 1.5 packs/day for 30 years    Types: Cigarettes  . Smokeless tobacco: Current User  . Alcohol Use: Yes     daily      Review of Systems  Cardiovascular: Positive for chest pain.  All other systems reviewed and are negative.    Allergies  Review of patient's allergies indicates no known allergies.  Home Medications   Current Outpatient Rx  Name Route Sig Dispense Refill  . FOLIC ACID 1  MG PO TABS Oral Take 1 tablet (1 mg total) by mouth daily. 30 tablet 0  . ADULT MULTIVITAMIN W/MINERALS CH Oral Take 1 tablet by mouth daily.    Marland Kitchen OXAZEPAM 10 MG PO CAPS  Please dispense 18 pills - Take 1 pill three times a day for 3 days, then Take 1 pill two times a day for 3 days, then Take 1 pill once a day for 3 days and stop. 18 capsule 0  . THIAMINE HCL 100 MG PO TABS Oral Take 1 tablet (100 mg total) by mouth daily. 30 tablet 0    BP 169/95  Pulse 84  Temp 98.6 F (37 C) (Oral)  Resp 20  Ht 6\' 2"  (1.88 m)  Wt 195 lb (88.451 kg)  BMI 25.04 kg/m2  SpO2 99%  Physical Exam  Nursing note and vitals reviewed. Constitutional: He is oriented to person, place, and time. He appears well-developed and well-nourished.       Appears uncomfortable  HENT:  Head: Normocephalic and atraumatic.  Eyes: No scleral icterus.  Cardiovascular: Normal rate, regular rhythm and normal heart sounds.   Pulmonary/Chest: Effort normal and breath sounds normal. No respiratory distress.  Abdominal: Bowel sounds are decreased. There is tenderness in the epigastric area and periumbilical area. There is no rebound and no guarding.  Neurological: He is alert and oriented to person, place, and time.  Skin: Skin  is warm and dry.  Psychiatric: He has a normal mood and affect. His behavior is normal.    ED Course  Procedures (including critical care time) DIAGNOSTIC STUDIES: Oxygen Saturation is 99% on room air, normal by my interpretation.    COORDINATION OF CARE: 6:38PM EDP discusses pt ED treatment with pt.  6:40PM EDP ordered medication: 0.9% NaCl bolus, Dilaudid 1 mg, Zofran 4 mg, Protonix 40 mg  Results for orders placed during the hospital encounter of 01/29/12  TROPONIN I      Component Value Range   Troponin I <0.30  <0.30 ng/mL  CBC WITH DIFFERENTIAL      Component Value Range   WBC 8.1  4.0 - 10.5 K/uL   RBC 4.45  4.22 - 5.81 MIL/uL   Hemoglobin 15.7  13.0 - 17.0 g/dL   HCT 29.5  62.1 -  30.8 %   MCV 100.7 (*) 78.0 - 100.0 fL   MCH 35.3 (*) 26.0 - 34.0 pg   MCHC 35.0  30.0 - 36.0 g/dL   RDW 65.7  84.6 - 96.2 %   Platelets 148 (*) 150 - 400 K/uL   Neutrophils Relative 74  43 - 77 %   Neutro Abs 6.1  1.7 - 7.7 K/uL   Lymphocytes Relative 17  12 - 46 %   Lymphs Abs 1.3  0.7 - 4.0 K/uL   Monocytes Relative 8  3 - 12 %   Monocytes Absolute 0.6  0.1 - 1.0 K/uL   Eosinophils Relative 1  0 - 5 %   Eosinophils Absolute 0.1  0.0 - 0.7 K/uL   Basophils Relative 0  0 - 1 %   Basophils Absolute 0.0  0.0 - 0.1 K/uL  COMPREHENSIVE METABOLIC PANEL      Component Value Range   Sodium 135  135 - 145 mEq/L   Potassium 3.7  3.5 - 5.1 mEq/L   Chloride 97  96 - 112 mEq/L   CO2 22  19 - 32 mEq/L   Glucose, Bld 111 (*) 70 - 99 mg/dL   BUN 8  6 - 23 mg/dL   Creatinine, Ser 9.52  0.50 - 1.35 mg/dL   Calcium 84.1  8.4 - 32.4 mg/dL   Total Protein 7.6  6.0 - 8.3 g/dL   Albumin 3.6  3.5 - 5.2 g/dL   AST 75 (*) 0 - 37 U/L   ALT 56 (*) 0 - 53 U/L   Alkaline Phosphatase 78  39 - 117 U/L   Total Bilirubin 0.7  0.3 - 1.2 mg/dL   GFR calc non Af Amer >90  >90 mL/min   GFR calc Af Amer >90  >90 mL/min  LIPASE, BLOOD      Component Value Range   Lipase 703 (*) 11 - 59 U/L   Dg Chest Portable 1 View  01/29/2012  *RADIOLOGY REPORT*  Clinical Data: 56 year old male with chest pain, epigastric pain, pancreatitis.  PORTABLE CHEST - 1 VIEW  Comparison: 11/11/2011 and earlier.  Findings: Portable AP view at 1850 hours.  Stable lung volumes. Cardiac size and mediastinal contours are within normal limits. Visualized tracheal air column is within normal limits.  No pneumothorax, or pulmonary edema.  There is new blunting of the left costophrenic sulcus.  There is mild increased streaky retrocardiac opacity.  IMPRESSION: Small pleural effusion suspected.  Increased retrocardiac opacity could reflect atelectasis (favored) or infection.  Original Report Authenticated By: Harley Hallmark, M.D.    Date:  01/29/2012  Rate:  87  Rhythm: normal sinus rhythm and sinus arrhythmia  QRS Axis: normal  Intervals: normal  ST/T Wave abnormalities: normal  Conduction Disutrbances:none  Narrative Interpretation: Normal ECG. When compared with ECG of 11/11/2011, no significant changes are seen to  Old EKG Reviewed: none available    1. Pancreatitis   2. Elevated liver enzymes       MDM  Abdominal pain with alcohol abuse which most likely represents recurrent pancreatitis. He'll be treated with IV fluids, IV narcotics, IV antiemetics, and IV proton pump and catheters. Old records have been reviewed and he did have a recent hospitalization for pancreatitis. This was 2 months ago.  He got good relief of pain with hydromorphone, and got good relief of nausea with ondansetron. Pain started to recur and he is given additional hydromorphone. There is also concerned that he might conceivably start going into alcohol withdrawal and is given a dose of Ativan. Case is discussed Dr. Renard Matter is on call for Dr. Sudie Bailey agrees to admit the patient.  I personally performed the services described in this documentation, which was scribed in my presence. The recorded information has been reviewed and considered.       Dione Booze, MD 01/29/12 2033

## 2012-01-30 LAB — URINALYSIS, ROUTINE W REFLEX MICROSCOPIC
Glucose, UA: NEGATIVE mg/dL
Leukocytes, UA: NEGATIVE
Protein, ur: NEGATIVE mg/dL
Urobilinogen, UA: 0.2 mg/dL (ref 0.0–1.0)

## 2012-01-30 MED ORDER — HYDROMORPHONE HCL PF 1 MG/ML IJ SOLN
1.0000 mg | INTRAMUSCULAR | Status: DC | PRN
  Administered 2012-01-30 – 2012-01-31 (×8): 1 mg via INTRAVENOUS
  Filled 2012-01-30 (×8): qty 1

## 2012-01-30 NOTE — Plan of Care (Signed)
Problem: Phase I Progression Outcomes Goal: OOB as tolerated unless otherwise ordered Outcome: Progressing Pt ambulated to restroom, with only standby assist, tolerated well.

## 2012-01-30 NOTE — Care Management Note (Unsigned)
    Page 1 of 1   01/30/2012     3:09:08 PM   CARE MANAGEMENT NOTE 01/30/2012  Patient:  [name], [name]   Account Number:  [id number]  Date Initiated:  01/30/2012  Documentation initiated by:  Sharrie Rothman  Subjective/Objective Assessment:   Pt admitted with pancreatitis. Pt lives alone and will return home at discharge. Pt is independent with ADL's.     Action/Plan:   No CM needs noted. Pt is aware that CM is available if needs do arise.   Anticipated DC Date:  02/01/2012   Anticipated DC Plan:  HOME/SELF CARE      DC Planning Services  CM consult      Choice offered to / List presented to:             Status of service:  Completed, signed off Medicare Important Message given?   (If response is "NO", the following Medicare IM given date fields will be blank) Date Medicare IM given:   Date Additional Medicare IM given:    Discharge Disposition:    Per UR Regulation:    If discussed at Long Length of Stay Meetings, dates discussed:    Comments:  01/30/12 1508 Arlyss Queen, RN BSN CM

## 2012-01-30 NOTE — Progress Notes (Signed)
UR Chart Review Completed  

## 2012-01-30 NOTE — Progress Notes (Signed)
[name], KNUTZEN NO.:  [id number]  MEDICAL RECORD NO.:  [id number]  LOCATION:  A319                          FACILITY:  [facility]  PHYSICIAN:  Mila Homer. Sudie Bailey, M.D.DATE OF BIRTH:  01-Aug-1956  DATE OF PROCEDURE: DATE OF DISCHARGE:                                PROGRESS NOTE   SUBJECTIVE:  This 56 year old presented to the hospital emergency room last night with pancreatitis.  He currently is still having pain, and Dilaudid does not cover him 4 hours.  He drinks gin or vodka, and probably about 4 or 5 drinks a night.  He had another bout of pancreatitis about 2 months ago and was treated at [facility] at that time.  For a long time, he was off alcohol and then just resumed it more recently.  He still smokes about a pack of cigarettes a day.  OBJECTIVE:  VITAL SIGNS:  Temperature is 99 degrees, pulse 98, respiratory rate 16, blood pressure 145/88. GENERAL:  He is supine in bed.  He is in distress due to epigastric pain. LUNGS:  Decreased breath sounds throughout, but he is moving air well. HEART:  Regular rhythm and a rate of about 90. ABDOMEN:  Tenderness in the epigastrium.  Admission lipase was 703.  ASSESSMENT: 1. Acute pancreatitis. 2. Chronic ethyl alcohol use. 3. Tobacco use disorder.  PLAN:  Increase the hydrocodone to 1 mg IV q.2 hours.  Continue with thiamine 100 mg daily and multivitamins daily, folic acid 1 mg daily.  He has not been drinking for that long and as never had DTs before, so I hope this is not an issue.  Presently, he is not craving cigarettes, but thinks that the patch might help him when he gets discharged.     Mila Homer. Sudie Bailey, M.D.     SDK/MEDQ  D:  01/30/2012  T:  01/30/2012  Job:  [id number]

## 2012-01-31 LAB — COMPREHENSIVE METABOLIC PANEL
BUN: 8 mg/dL (ref 6–23)
CO2: 26 mEq/L (ref 19–32)
Calcium: 9.3 mg/dL (ref 8.4–10.5)
Creatinine, Ser: 0.7 mg/dL (ref 0.50–1.35)
GFR calc Af Amer: >90 mL/min (ref >90)
GFR calc non Af Amer: >90 mL/min (ref >90)
Glucose, Bld: 84 mg/dL (ref 70–99)
Total Protein: 6.9 g/dL (ref 6.0–8.3)

## 2012-01-31 LAB — CBC
Hemoglobin: 14.7 g/dL (ref 13.0–17.0)
MCH: 34.3 pg — ABNORMAL HIGH (ref 26.0–34.0)
MCHC: 34.1 g/dL (ref 30.0–36.0)
MCV: 100.7 fL — ABNORMAL HIGH (ref 78.0–100.0)
RBC: 4.28 MIL/uL (ref 4.22–5.81)

## 2012-01-31 LAB — LIPASE, BLOOD: Lipase: 167 U/L — ABNORMAL HIGH (ref 11–59)

## 2012-01-31 MED ORDER — HYDROMORPHONE HCL PF 1 MG/ML IJ SOLN: 2.0000 mg | INTRAMUSCULAR | Status: DC | PRN

## 2012-01-31 MED ORDER — SODIUM CHLORIDE 0.9 % IV SOLN
INTRAVENOUS | Status: DC
  Administered 2012-01-31: 20:00:00 via INTRAVENOUS

## 2012-01-31 MED ORDER — SODIUM CHLORIDE 0.9 % IJ SOLN
INTRAMUSCULAR | Status: AC
  Administered 2012-01-31: 11:00:00
  Filled 2012-01-31: qty 6

## 2012-01-31 MED ORDER — HYDROMORPHONE HCL 4 MG PO TABS
4.0000 mg | ORAL_TABLET | ORAL | Status: DC | PRN
  Administered 2012-01-31 – 2012-02-01 (×5): 4 mg via ORAL
  Filled 2012-01-31 (×5): qty 1

## 2012-01-31 NOTE — Progress Notes (Signed)
NAMEJAYQUAN, [name] NO.:  [id number]  MEDICAL RECORD NO.:  [id number]  LOCATION:  A319                          FACILITY:  [facility]  PHYSICIAN:  Mila Homer. Sudie Bailey, M.D.DATE OF BIRTH:  June 13, 1956  DATE OF PROCEDURE: DATE OF DISCHARGE:                                PROGRESS NOTE   SUBJECTIVE:  He is still having epigastric abdominal pain.  He gets 1 mg of hydromorphone IV, and that lasts for about 30-45 minutes.  He is on a 2-hour schedule p.r.n.  OBJECTIVE:  VITAL SIGNS:  Temperature is 98.8, pulse 89, respiratory rate 18, blood pressure 133/87. GENERAL:  He is supine in bed.  He is in no acute distress.  He is well- developed, well-nourished.  The area of pain extends from the epigastrium down both sides but does not get worse with palpation. HEART:  Regular rhythm with a rate of 80. LUNGS:  Clear throughout.  He is moving air well but decreased breath sounds.  Blood tests done today included a CBC and CMP.  His sodium is 131 with a chloride of 94.  His lipase is 167, down from 703; his AST is 29, down from 75; and ALT is 30, down from 56.  His white cell count was 9200 with a hemoglobin of 14.7.  MCV is still slightly elevated at 100.7 with a platelet count of 123.  ASSESSMENT: 1. Acute pancreatitis, probably secondary to ethyl alcohol. 2. Chronic ethyl alcohol use. 3. Alcoholic hepatitis. 4. Thrombocytopenia, probably secondary to alcohol. 5. Tobacco use disorder.  PLAN:  He was advanced from sips and chips of water and ice to clear liquids today.  We will recheck the lipase and the LFTs tomorrow.  I have advanced his hydromorphone to 2 mg IV q.2 hours, but I am going to put him also on 4 mg p.o. q.4 hours to see if this will hold him so that, perhaps tomorrow, we can discharge him home on this medicine.     Mila Homer. Sudie Bailey, M.D.     SDK/MEDQ  D:  01/31/2012  T:  01/31/2012  Job:  [id number]

## 2012-02-01 LAB — COMPREHENSIVE METABOLIC PANEL
AST: 23 U/L (ref 0–37)
Albumin: 2.8 g/dL — ABNORMAL LOW (ref 3.5–5.2)
Alkaline Phosphatase: 71 U/L (ref 39–117)
BUN: 7 mg/dL (ref 6–23)
Chloride: 97 mEq/L (ref 96–112)
Potassium: 3.7 mEq/L (ref 3.5–5.1)
Sodium: 133 mEq/L — ABNORMAL LOW (ref 135–145)
Total Protein: 6.7 g/dL (ref 6.0–8.3)

## 2012-02-01 LAB — LIPASE, BLOOD: Lipase: 204 U/L — ABNORMAL HIGH (ref 11–59)

## 2012-02-01 MED ORDER — HYDROMORPHONE HCL 4 MG PO TABS: 4.0000 mg | ORAL_TABLET | ORAL | Status: AC | PRN

## 2012-02-01 MED ORDER — BISACODYL 10 MG RE SUPP: 10.0000 mg | RECTAL | Status: AC | PRN

## 2012-02-01 NOTE — H&P (Signed)
NAMEEGIDIO, LOFGREN NO.:  [id number]  MEDICAL RECORD NO.:  [id number]  LOCATION:  A319                          FACILITY:  [facility]  PHYSICIAN:  Mila Homer. Sudie Bailey, M.D.DATE OF BIRTH:  23-Aug-1955  DATE OF ADMISSION:  01/29/2012 DATE OF DISCHARGE:  LH                             HISTORY & PHYSICAL   This 56 year old presented to the hospital emergency room with increasing abdominal pain.  He had similar problems 2 months before and was treated at [facility] for acute pancreatitis, requiring about a 3 day hospitalization.  He is currently drinking at least 3-4 alcoholic beverages daily.  He generally is healthy.  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  CURRENT MEDICATIONS:  Folic acid 1 mg daily, thiamine 100 mg daily, ibuprofen 200 mg p.r.n., multivitamins with minerals daily and hydrocodone/APAP 5/500.  A number these prescriptions he started after discharge from [facility] with his last bout of pancreatitis.  He also smokes a pack a day.  Other medical problems include COPD, benign essential hypertension, history of pneumothorax.  He had a thoracotomy for the pneumothorax.  In the emergency department, he had a temperature of 98.3, respiratory rate 16, pulse 90, blood pressure 162/101.  O2 saturation 93%.  At the time of my exam, he was well developed, well nourished with normal speech and sensorium.  His pain had improved somewhat, but he still had distinct tenderness in the epigastrium and also spreading out to both sides.  Abdomen:  Soft, however.  There was no organomegaly or mass noted.  His heart had a regular rhythm and rate of about 90.  His lungs appeared to be clear throughout.  He had no edema of the ankles.  His admission white cell count was 8100, hemoglobin 15.7, platelet count 148,000.  He had a normal diff.  His MCV was 100.7.  CMP showed a glucose of 111, lipase 703, AST 75,  ALT 56.  His urine was negative.  Chest x-ray showed what appeared to be a small pleural effusion on the left side and possibly atelectasis.  EKG showed a normal sinus rhythm at a rate of 87.  It was felt to be normal.  ADMISSION DIAGNOSES: 1. Acute pancreatitis. 2. Chronic ethyl alcohol use. 3. Chronic obstructive pulmonary disease. 4. Tobacco use disorder. 5. Benign essential hypertension.  He will be admitted to the hospital, treated with IV fluids, n.p.o. until his lipase drops towards normal and he is feeling better.     Mila Homer. Sudie Bailey, M.D.     SDK/MEDQ  D:  02/01/2012  T:  02/01/2012  Job:  [id number]

## 2012-02-01 NOTE — Discharge Summary (Signed)
NAMENICKIE, DEREN NO.:  [id number]  MEDICAL RECORD NO.:  [id number]  LOCATION:  A319                          FACILITY:  [facility]  PHYSICIAN:  Mila Homer. Sudie Bailey, M.D.DATE OF BIRTH:  12-14-1955  DATE OF ADMISSION:  01/30/2012 DATE OF DISCHARGE:  06/30/2013LH                              DISCHARGE SUMMARY   HISTORY OF PRESENT ILLNESS:  This 56 year old was admitted to the hospital with acute pancreatitis.  He had a fairly benign 4 day hospitalization extending from June 28th to February 01, 2012.  His vital signs remained stable.  His admission white cell count was 8100 with an MCV of 100.7 and recheck 9200.  His platelet count was 148, recheck 123,000.  His admission CMP showed a glucose 111, lipase 703, AST 75 and ALT 56,.  His second day lipase had dropped to 167 and 3rd day 204.  His albumin dropped to 2.8.  Recheck glucose was 84 then 110.  His liver function tests normalized.  Admission EKG was normal.  Chest x-ray showed what looked like a small pleural effusion.  In the hospital he had absolutely no desire for alcohol or cigarettes. He was on IV normal saline and received IV Dilaudid 1 mg, then 2 mg inhaled IV every 4 hours.  His 2nd day he was put on Dilaudid 4 mg p.o. q.4 hours, which seemed to alleviate his pain for about 2-1/2 hours.  He was taking clear liquids by that time and doing well with this without increased abdominal pain or nausea or vomiting.  He had 1 bowel movement in the hospital.  He was discharged home on the following medications:  DISCHARGE MEDICATIONS: 1. Folic acid 1 mg daily. 2. Thiamine 100 mg daily. 3. Multivitamin with minerals daily. 4. Ibuprofen 200 mg p.r.n. 5. Bisacodyl 10 mg suppository as needed for constipation. 6. Hydromorphone 4 mg q.3 hours p.r.n. severe pain (use 100 with no     refills).  DISCHARGE INSTRUCTIONS:  He is to stick to clear liquids until his abdominal pain has totally cleared, at which time  he can advance his diet.  He is to gradually increase his activity.  He is to call me if he develops increased severity of abdominal pain, nausea or vomiting. Otherwise I will see him about 10 days after discharge.  FINAL DISCHARGE DIAGNOSES: 1. Acute pancreatitis. 2. Alcoholic hepatitis. 3. Chronic ethyl alcohol use. 4. Tobacco use disorder. 5. Chronic obstructive pulmonary disease. 6. Benign essential hypertension.     Mila Homer. Sudie Bailey, M.D.     SDK/MEDQ  D:  02/01/2012  T:  02/01/2012  Job:  [id number]

## 2012-02-01 NOTE — Progress Notes (Signed)
Patient left floor walking, accompanied by staff and family No c/o pain at d/c Verbalized understanding of d/c instructions, new RX's, and when to follow up with DR. Craven Crean [employer]

## 2013-03-01 ENCOUNTER — Other Ambulatory Visit (HOSPITAL_COMMUNITY): Payer: Self-pay | Admitting: Family Medicine

## 2013-03-01 ENCOUNTER — Ambulatory Visit (HOSPITAL_COMMUNITY)
Admission: RE | Admit: 2013-03-01 | Discharge: 2013-03-01 | Disposition: A | Discharge status: Home / Self Care | Payer: 59 | Source: Ambulatory Visit | Attending: Family Medicine | Admitting: Family Medicine

## 2013-03-01 DIAGNOSIS — R079 Chest pain, unspecified: Secondary | ICD-10-CM | POA: Insufficient documentation

## 2013-03-01 DIAGNOSIS — J449 Chronic obstructive pulmonary disease, unspecified: Secondary | ICD-10-CM | POA: Insufficient documentation

## 2013-03-01 DIAGNOSIS — J4489 Other specified chronic obstructive pulmonary disease: Secondary | ICD-10-CM | POA: Insufficient documentation

## 2013-03-01 DIAGNOSIS — R091 Pleurisy: Secondary | ICD-10-CM

## 2014-12-02 ENCOUNTER — Emergency Department (INDEPENDENT_AMBULATORY_CARE_PROVIDER_SITE_OTHER)
Admission: EM | Admit: 2014-12-02 | Discharge: 2014-12-02 | Disposition: A | Discharge status: Home / Self Care | Payer: 59 | Source: Home / Self Care | Attending: Family Medicine | Admitting: Family Medicine

## 2014-12-02 ENCOUNTER — Encounter (HOSPITAL_COMMUNITY): Payer: Self-pay | Admitting: *Deleted

## 2014-12-02 DIAGNOSIS — T148 Other injury of unspecified body region: Secondary | ICD-10-CM

## 2014-12-02 DIAGNOSIS — W57XXXA Bitten or stung by nonvenomous insect and other nonvenomous arthropods, initial encounter: Secondary | ICD-10-CM

## 2014-12-02 MED ORDER — DOXYCYCLINE HYCLATE 100 MG PO CAPS: 100.0000 mg | ORAL_CAPSULE | Freq: Two times a day (BID) | ORAL | Status: DC

## 2014-12-02 NOTE — ED Notes (Signed)
Pt     Reports   Symptoms  Of  Redness      To  r  Upper  Thigh       Removed a    Tick    4  Days   Ago      Pt  Reports  The  Redness  Is   Spreading      Slight  Swelling  present  As  Well         Pt  Has  Been  Applying  Camphor to  The  Affected  Area         He  denys     Any      Systemic  Symptoms

## 2014-12-02 NOTE — ED Provider Notes (Signed)
CSN: [id number]     Arrival date & time 12/02/14  0930 History   First MD Initiated Contact with Patient 12/02/14 [id number]     Chief Complaint  Patient presents with  . Tick Removal   (Consider location/radiation/quality/duration/timing/severity/associated sxs/prior Treatment) HPI Comments: Patient presents for evaluation of an expanding area of redness and induration at right medial thigh following tick removal from area 4 days ago. Denies fever, chills, headaches, malaise or joint pain.  Reports himself to be otherwise healthy.   The history is provided by the patient.    Past Medical History  Diagnosis Date  . COPD (chronic obstructive pulmonary disease)   . Pneumonia   . Pneumothorax   . Hypertension   . Shortness of breath   . Pancreatitis    Past Surgical History  Procedure Laterality Date  . Thoracotomy     History reviewed. No pertinent family history. History  Substance Use Topics  . Smoking status: Current Every Day Smoker -- 1.00 packs/day for 30 years    Types: Cigarettes  . Smokeless tobacco: Former Systems developer  . Alcohol Use: 0.0 oz/week     Comment: daily    Review of Systems  All other systems reviewed and are negative.   Allergies  Review of patient's allergies indicates no known allergies.  Home Medications   Prior to Admission medications   Medication Sig Start Date End Date Taking? Authorizing Provider  doxycycline (VIBRAMYCIN) 100 MG capsule Take 1 capsule (100 mg total) by mouth 2 (two) times daily. 12/02/14   Audelia Hives Heyden Jaber, PA  ibuprofen (ADVIL,MOTRIN) 200 MG tablet Take 200 mg by mouth as needed. For pain    Historical Provider, MD  Multiple Vitamin (MULITIVITAMIN WITH MINERALS) TABS Take 1 tablet by mouth daily.    Historical Provider, MD   BP 120/85 mmHg  Pulse 86  Temp(Src) 98.5 F (36.9 C) (Oral)  Resp 16  SpO2 95% Physical Exam  Constitutional: He is oriented to person, place, and time. He appears well-developed and well-nourished.  No distress.  HENT:  Head: Normocephalic and atraumatic.  Eyes: Conjunctivae are normal.  Cardiovascular: Normal rate.   Pulmonary/Chest: Effort normal.  Musculoskeletal: Normal range of motion.  Neurological: He is alert and oriented to person, place, and time.  Skin: Skin is warm and dry. There is erythema.  4 x 4 cm round area of redness and induration at right upper medial thigh with small central puncture wound and no fluctuance No visible retained tick parts  Psychiatric: He has a normal mood and affect. His behavior is normal.  Nursing note and vitals reviewed.   ED Course  Procedures (including critical care time) Labs Review Labs Reviewed - No data to display  Imaging Review No results found.   MDM   1. Tick bite    Warm compresses Doxycycline Monitor at home PCP follow up if no improvement.     Lutricia Feil, [place] 12/02/14 1003

## 2014-12-02 NOTE — Discharge Instructions (Signed)
Tick Bite Information Ticks are insects that attach themselves to the skin and draw blood for food. There are various types of ticks. Common types include wood ticks and deer ticks. Most ticks live in shrubs and grassy areas. Ticks can climb onto your body when you make contact with leaves or grass where the tick is waiting. The most common places on the body for ticks to attach themselves are the scalp, neck, armpits, waist, and groin. Most tick bites are harmless, but sometimes ticks carry germs that cause diseases. These germs can be spread to a person during the tick's feeding process. The chance of a disease spreading through a tick bite depends on:   The type of tick.  Time of year.   How long the tick is attached.   Geographic location.  HOW CAN YOU PREVENT TICK BITES? Take these steps to help prevent tick bites when you are outdoors:  Wear protective clothing. Long sleeves and long pants are best.   Wear white clothes so you can see ticks more easily.  Tuck your pant legs into your socks.   If walking on a trail, stay in the middle of the trail to avoid brushing against bushes.  Avoid walking through areas with long grass.  Put insect repellent on all exposed skin and along boot tops, pant legs, and sleeve cuffs.   Check clothing, hair, and skin repeatedly and before going inside.   Brush off any ticks that are not attached.  Take a shower or bath as soon as possible after being outdoors.  WHAT IS THE PROPER WAY TO REMOVE A TICK? Ticks should be removed as soon as possible to help prevent diseases caused by tick bites. 1. If latex gloves are available, put them on before trying to remove a tick.  2. Using fine-point tweezers, grasp the tick as close to the skin as possible. You may also use curved forceps or a tick removal tool. Grasp the tick as close to its head as possible. Avoid grasping the tick on its body. 3. Pull gently with steady upward pressure until  the tick lets go. Do not twist the tick or jerk it suddenly. This may break off the tick's head or mouth parts. 4. Do not squeeze or crush the tick's body. This could force disease-carrying fluids from the tick into your body.  5. After the tick is removed, wash the bite area and your hands with soap and water or other disinfectant such as alcohol. 6. Apply a small amount of antiseptic cream or ointment to the bite site.  7. Wash and disinfect any instruments that were used.  Do not try to remove a tick by applying a hot match, petroleum jelly, or fingernail polish to the tick. These methods do not work and may increase the chances of disease being spread from the tick bite.  WHEN SHOULD YOU SEEK MEDICAL CARE? Contact your health care provider if you are unable to remove a tick from your skin or if a part of the tick breaks off and is stuck in the skin.  After a tick bite, you need to be aware of signs and symptoms that could be related to diseases spread by ticks. Contact your health care provider if you develop any of the following in the days or weeks after the tick bite:  Unexplained fever.  Rash. A circular rash that appears days or weeks after the tick bite may indicate the possibility of Lyme disease. The rash may resemble   a target with a bull's-eye and may occur at a different part of your body than the tick bite.  Redness and swelling in the area of the tick bite.   Tender, swollen lymph glands.   Diarrhea.   Weight loss.   Cough.   Fatigue.   Muscle, joint, or bone pain.   Abdominal pain.   Headache.   Lethargy or a change in your level of consciousness.  Difficulty walking or moving your legs.   Numbness in the legs.   Paralysis.  Shortness of breath.   Confusion.   Repeated vomiting.  Document Released: 07/18/2000 Document Revised: 05/11/2013 Document Reviewed: 12/29/2012 [employer] Patient Information 2015 [employer]. This information is  not intended to replace advice given to you by your health care provider. Make sure you discuss any questions you have with your health care provider.  

## 2015-09-18 ENCOUNTER — Telehealth: Payer: Self-pay | Admitting: Internal Medicine

## 2015-09-18 NOTE — Telephone Encounter (Signed)
RECALL FOR TCS °

## 2015-09-19 NOTE — Telephone Encounter (Signed)
Letter mailed to pt.  

## 2015-10-19 ENCOUNTER — Ambulatory Visit (HOSPITAL_COMMUNITY)
Admission: RE | Admit: 2015-10-19 | Discharge: 2015-10-19 | Disposition: A | Discharge status: Home / Self Care | Payer: 59 | Source: Ambulatory Visit | Attending: Family Medicine | Admitting: Family Medicine

## 2015-10-19 ENCOUNTER — Other Ambulatory Visit (HOSPITAL_COMMUNITY): Payer: Self-pay | Admitting: Family Medicine

## 2015-10-19 DIAGNOSIS — R0602 Shortness of breath: Secondary | ICD-10-CM | POA: Insufficient documentation

## 2015-10-19 DIAGNOSIS — J439 Emphysema, unspecified: Secondary | ICD-10-CM | POA: Insufficient documentation

## 2015-10-19 DIAGNOSIS — J449 Chronic obstructive pulmonary disease, unspecified: Secondary | ICD-10-CM | POA: Diagnosis not present

## 2015-12-17 ENCOUNTER — Other Ambulatory Visit (HOSPITAL_COMMUNITY): Payer: Self-pay | Admitting: Family Medicine

## 2015-12-17 ENCOUNTER — Ambulatory Visit (HOSPITAL_COMMUNITY)
Admission: RE | Admit: 2015-12-17 | Discharge: 2015-12-17 | Disposition: A | Discharge status: Home / Self Care | Payer: 59 | Source: Ambulatory Visit | Attending: Family Medicine | Admitting: Family Medicine

## 2015-12-17 DIAGNOSIS — R600 Localized edema: Secondary | ICD-10-CM | POA: Insufficient documentation

## 2016-01-22 ENCOUNTER — Other Ambulatory Visit: Payer: Self-pay | Admitting: Dermatology

## 2016-01-30 ENCOUNTER — Other Ambulatory Visit: Payer: Self-pay

## 2016-01-30 DIAGNOSIS — M79606 Pain in leg, unspecified: Secondary | ICD-10-CM

## 2016-04-25 ENCOUNTER — Encounter: Payer: Self-pay | Admitting: Vascular Surgery

## 2016-04-30 ENCOUNTER — Ambulatory Visit (INDEPENDENT_AMBULATORY_CARE_PROVIDER_SITE_OTHER): Payer: 59 | Admitting: Vascular Surgery

## 2016-04-30 ENCOUNTER — Encounter: Payer: Self-pay | Admitting: Vascular Surgery

## 2016-04-30 ENCOUNTER — Ambulatory Visit (HOSPITAL_COMMUNITY)
Admission: RE | Admit: 2016-04-30 | Discharge: 2016-04-30 | Disposition: A | Discharge status: Home / Self Care | Payer: 59 | Source: Ambulatory Visit | Attending: Vascular Surgery | Admitting: Vascular Surgery

## 2016-04-30 VITALS — BP 141/91 | HR 90 | Temp 98.5°F | Resp 18 | Ht 74.0 in | Wt 210.0 lb

## 2016-04-30 DIAGNOSIS — M79606 Pain in leg, unspecified: Secondary | ICD-10-CM | POA: Insufficient documentation

## 2016-04-30 DIAGNOSIS — I8393 Asymptomatic varicose veins of bilateral lower extremities: Secondary | ICD-10-CM | POA: Insufficient documentation

## 2016-04-30 DIAGNOSIS — I872 Venous insufficiency (chronic) (peripheral): Secondary | ICD-10-CM | POA: Diagnosis not present

## 2016-04-30 NOTE — Progress Notes (Signed)
Patient name: [name] MRN: [id number] DOB: 1956-06-11 Sex: male  REASON FOR CONSULT: Chronic venous insufficiency. Referred by Dr. Leslie Andrea.  HPI: GRZEGORZ [name] is a 60 y.o. male, who is referred for evaluation of chronic venous insufficiency. He has a long history of discoloration of both lower extremities and bilateral leg swelling. This is in more significant on the left side. He denies any previous history of DVT or phlebitis. He does spend a fair amount of time sitting at work which I think has contributed. He experiences aching pain and heaviness in his leg which is associated with standing and sitting and relieved with elevation.  I have reviewed the records from Dr. Vickey Sages office. The patient does have a history of COPD. In addition he has had issues with bronchitis. He has recently quit tobacco.  He did have a venous duplex scan done in May of this year because of left lower extremity swelling. This showed no evidence of deep venous thrombosis.  Past Medical History:  Diagnosis Date  . COPD (chronic obstructive pulmonary disease) ([place])   . Hypertension   . Pancreatitis   . Pneumonia   . Pneumothorax   . Shortness of breath     History reviewed. No pertinent family history.  SOCIAL HISTORY: Social History   Social History  . Marital status: Single    Spouse name: N/A  . Number of children: N/A  . Years of education: N/A   Occupational History  . Not on file.   Social History Main Topics  . Smoking status: Current Every Day Smoker    Packs/day: 1.00    Years: 30.00    Types: Cigarettes  . Smokeless tobacco: Former Systems developer  . Alcohol use 0.0 oz/week     Comment: daily  . Drug use: No  . Sexual activity: Not Currently   Other Topics Concern  . Not on file   Social History Narrative  . No narrative on file    No Known Allergies  Current Outpatient Prescriptions  Medication Sig Dispense Refill  . Multiple Vitamin (MULITIVITAMIN WITH  MINERALS) TABS Take 1 tablet by mouth daily.    Marland Kitchen doxycycline (VIBRAMYCIN) 100 MG capsule Take 1 capsule (100 mg total) by mouth 2 (two) times daily. (Patient not taking: Reported on 04/30/2016) 14 capsule 0  . ibuprofen (ADVIL,MOTRIN) 200 MG tablet Take 200 mg by mouth as needed. For pain     No current facility-administered medications for this visit.     REVIEW OF SYSTEMS:  [X]  denotes positive finding, [ ]  denotes negative finding Cardiac  Comments:  Chest pain or chest pressure:    Shortness of breath upon exertion:    Short of breath when lying flat:    Irregular heart rhythm:        Vascular    Pain in calf, thigh, or hip brought on by ambulation:    Pain in feet at night that wakes you up from your sleep:     Blood clot in your veins:    Leg swelling:  X       Pulmonary    Oxygen at home:    Productive cough:     Wheezing:         Neurologic    Sudden weakness in arms or legs:     Sudden numbness in arms or legs:     Sudden onset of difficulty speaking or slurred speech:    Temporary loss of vision in one eye:  Problems with dizziness:         Gastrointestinal    Blood in stool:     Vomited blood:         Genitourinary    Burning when urinating:     Blood in urine:        Psychiatric    Major depression:         Hematologic    Bleeding problems:    Problems with blood clotting too easily:        Skin    Rashes or ulcers:        Constitutional    Fever or chills:      PHYSICAL EXAM: Vitals:   04/30/16 1358 04/30/16 1401  BP: (!) 145/94 (!) 141/91  Pulse: 90   Resp: 18   Temp: 98.5 F (36.9 C)   TempSrc: Oral   SpO2: 91%   Weight: 210 lb (95.3 kg)   Height: 6\' 2"  (1.88 m)     GENERAL: The patient is a well-nourished male, in no acute distress. The vital signs are documented above. CARDIAC: There is a regular rate and rhythm.  VASCULAR: I do not detect carotid bruits. He has palpable femoral, popliteal, and pedal pulses bilaterally. He  has no significant lower extremity swelling. He does have hyperpigmentation bilaterally consistent with chronic venous insufficiency. PULMONARY: There is good air exchange bilaterally without wheezing or rales. ABDOMEN: Soft and non-tender with normal pitched bowel sounds.  MUSCULOSKELETAL: There are no major deformities or cyanosis. NEUROLOGIC: No focal weakness or paresthesias are detected. SKIN: There are no ulcers or rashes noted. PSYCHIATRIC: The patient has a normal affect.  DATA:   BILATERAL LOWER EXTREMITY VENOUS DUPLEX: I have independently interpreted his bilateral lower extreme venous duplex scan.  On the right side, there is no evidence of DVT or superficial thrombophlebitis. There is deep vein reflux involving the right common femoral vein. There is no significant reflux in the great saphenous vein or small saphenous vein.  On the left side, there is no evidence of DVT or superficial thrombophlebitis. There is reflux involving the deep system in the common femoral vein. There is also reflux at the saphenofemoral junction on the left and also in the left great saphenous vein.  MEDICAL ISSUES:  CHRONIC VENOUS INSUFFICIENCY: Based on his exam and duplex, he has evidence of chronic venous insufficiency bilaterally. He has deep vein reflux on the right and deep vein and superficial vein reflux on the left. However, the reflux in the saphenous vein on the right is not significant enough to consider laser ablation. We have discussed that this is a chronic problem. I have encouraged him to elevate his legs daily and have instructed him on the proper positioning for this. In addition, I have written him a prescription for knee-high compression stockings with a gradient of 15-20 mmHg. I've encouraged him to stay as active as possible especially walking,. I have encouraged him to consider water aerobics which I think is also very helpful for people with chronic venous insufficiency. I've  encouraged him to try to avoid prolonged sitting and standing. Certainly if his symptoms or swelling progresses in the future would be happy to see him back at any time.   Deitra Mayo [facility] [phone]

## 2016-07-28 ENCOUNTER — Encounter (HOSPITAL_COMMUNITY): Payer: Self-pay | Admitting: Emergency Medicine

## 2016-07-28 ENCOUNTER — Emergency Department (HOSPITAL_COMMUNITY)
Admission: EM | Admit: 2016-07-28 | Discharge: 2016-07-28 | Disposition: A | Discharge status: Home / Self Care | Payer: 59 | Attending: Emergency Medicine | Admitting: Emergency Medicine

## 2016-07-28 ENCOUNTER — Emergency Department (HOSPITAL_COMMUNITY): Payer: 59

## 2016-07-28 DIAGNOSIS — I10 Essential (primary) hypertension: Secondary | ICD-10-CM | POA: Insufficient documentation

## 2016-07-28 DIAGNOSIS — Z791 Long term (current) use of non-steroidal anti-inflammatories (NSAID): Secondary | ICD-10-CM | POA: Insufficient documentation

## 2016-07-28 DIAGNOSIS — F17210 Nicotine dependence, cigarettes, uncomplicated: Secondary | ICD-10-CM | POA: Insufficient documentation

## 2016-07-28 DIAGNOSIS — J449 Chronic obstructive pulmonary disease, unspecified: Secondary | ICD-10-CM | POA: Diagnosis not present

## 2016-07-28 DIAGNOSIS — Z79899 Other long term (current) drug therapy: Secondary | ICD-10-CM | POA: Insufficient documentation

## 2016-07-28 DIAGNOSIS — M545 Low back pain: Secondary | ICD-10-CM | POA: Diagnosis present

## 2016-07-28 DIAGNOSIS — M533 Sacrococcygeal disorders, not elsewhere classified: Secondary | ICD-10-CM | POA: Diagnosis not present

## 2016-07-28 MED ORDER — OXYCODONE-ACETAMINOPHEN 5-325 MG PO TABS
2.0000 | ORAL_TABLET | Freq: Once | ORAL | Status: AC
  Administered 2016-07-28: 2 via ORAL
  Filled 2016-07-28: qty 2

## 2016-07-28 MED ORDER — DICLOFENAC SODIUM 1 % TD GEL: 2.0000 g | Freq: Four times a day (QID) | TRANSDERMAL | 0 refills | Status: DC

## 2016-07-28 MED ORDER — ONDANSETRON 8 MG PO TBDP
8.0000 mg | ORAL_TABLET | Freq: Once | ORAL | Status: AC
  Administered 2016-07-28: 8 mg via ORAL
  Filled 2016-07-28: qty 1

## 2016-07-28 NOTE — ED Triage Notes (Signed)
Pt reports sharp low back pain after getting out of the car to pump gas yesterday.  Denies distal neuro changes.

## 2016-07-28 NOTE — Discharge Instructions (Signed)
Alternate heat and cold therapy.  Immobilize as much as possible.   Use voltaren gel as per package instructions. Return if any weakness or loss of bowel or bladder control

## 2016-07-30 NOTE — ED Provider Notes (Signed)
[facility] DEPT Provider Note   CSN: [id number] Arrival date & time: 07/28/16  [id number]     History   Chief Complaint Chief Complaint  Patient presents with  . Back Pain    HPI [name] is a 60 y.o. male.  HPI  This is a 60 year old male who complains of sharp low back pain that began yesterday after getting out of his car to pump gas. He describes it as localized low back region without radiation. He is not having any numbness or tingling distal to the injury. He denies upper back pain, dyspnea, nausea, or vomiting.  Past Medical History:  Diagnosis Date  . COPD (chronic obstructive pulmonary disease) ([place])   . Hypertension   . Pancreatitis   . Pneumonia   . Pneumothorax   . Shortness of breath     Patient Active Problem List   Diagnosis Date Noted  . Pancreatitis, alcoholic 0000000  . Alcoholism ([place]) 11/11/2011  . Tobacco abuse 11/11/2011  . Thrombocytopenia ([place]) 11/11/2011  . HYPERSOMNIA, ASSOCIATED WITH SLEEP APNEA 09/27/2009  . COPD 09/07/2009  . Pneumothorax 09/07/2009  . BURSITIS 09/07/2009  . FATIGUE / MALAISE 09/07/2009  . SHORTNESS OF BREATH 09/07/2009  . PNEUMOTHORAX 09/07/2009    Past Surgical History:  Procedure Laterality Date  . THORACOTOMY         Home Medications    Prior to Admission medications   Medication Sig Start Date End Date Taking? Authorizing Provider  diclofenac sodium (VOLTAREN) 1 % GEL Apply 2 g topically 4 (four) times daily. 07/28/16   Pattricia Boss, MD  doxycycline (VIBRAMYCIN) 100 MG capsule Take 1 capsule (100 mg total) by mouth 2 (two) times daily. Patient not taking: Reported on 04/30/2016 12/02/14   Audelia Hives Presson, PA  ibuprofen (ADVIL,MOTRIN) 200 MG tablet Take 200 mg by mouth as needed. For pain    Historical Provider, MD  Multiple Vitamin (MULITIVITAMIN WITH MINERALS) TABS Take 1 tablet by mouth daily.    Historical Provider, MD    Family History History reviewed. No pertinent family  history.  Social History Social History  Substance Use Topics  . Smoking status: Current Every Day Smoker    Packs/day: 1.00    Years: 30.00    Types: Cigarettes  . Smokeless tobacco: Former Systems developer  . Alcohol use 0.0 oz/week     Comment: daily     Allergies   Patient has no known allergies.   Review of Systems Review of Systems  All other systems reviewed and are negative.    Physical Exam Updated Vital Signs BP 148/94 (BP Location: Left Arm)   Pulse 85   Temp 98.2 F (36.8 C) (Oral)   Resp 18   Ht 6\' 2"  (1.88 m)   Wt 97.5 kg   SpO2 97%   BMI 27.60 kg/m   Physical Exam  Constitutional: He is oriented to person, place, and time. He appears well-developed and well-nourished.  HENT:  Head: Normocephalic and atraumatic.  Right Ear: External ear normal.  Left Ear: External ear normal.  Nose: Nose normal.  Mouth/Throat: Oropharynx is clear and moist.  Eyes: Conjunctivae and EOM are normal. Pupils are equal, round, and reactive to light.  Neck: Normal range of motion. Neck supple.  Cardiovascular: Normal rate, regular rhythm, normal heart sounds and intact distal pulses.   Pulmonary/Chest: Effort normal and breath sounds normal. No respiratory distress. He has no wheezes. He exhibits no tenderness.  Abdominal: Soft. Bowel sounds are normal. He exhibits no  distension and no mass. There is no tenderness. There is no guarding.  Musculoskeletal: Normal range of motion.  Tenderness to palpation over the sacrum without crepitus.  Neurological: He is alert and oriented to person, place, and time. He has normal reflexes. He displays normal reflexes. No cranial nerve deficit or sensory deficit. He exhibits normal muscle tone. Coordination normal.  Skin: Skin is warm and dry.  Psychiatric: He has a normal mood and affect. His behavior is normal. Judgment and thought content normal.  Nursing note and vitals reviewed.    ED Treatments / Results  Labs (all labs ordered are  listed, but only abnormal results are displayed) Labs Reviewed - No data to display  EKG  EKG Interpretation None       Radiology No results found.  Procedures Procedures (including critical care time)  Medications Ordered in ED Medications  oxyCODONE-acetaminophen (PERCOCET/ROXICET) 5-325 MG per tablet 2 tablet (2 tablets Oral Given 07/28/16 0812)  ondansetron (ZOFRAN-ODT) disintegrating tablet 8 mg (8 mg Oral Given 07/28/16 0908)     Initial Impression / Assessment and Plan / ED Course  I have reviewed the triage vital signs and the nursing notes.  Pertinent labs & imaging results that were available during my care of the patient were reviewed by me and considered in my medical decision making (see chart for details).  Clinical Course       Final Clinical Impressions(s) / ED Diagnoses   Final diagnoses:  Sacral pain    New Prescriptions Discharge Medication List as of 07/28/2016  9:08 AM    START taking these medications   Details  diclofenac sodium (VOLTAREN) 1 % GEL Apply 2 g topically 4 (four) times daily., Starting Mon 07/28/2016, Print         Pattricia Boss, MD 07/30/16 1409

## 2016-08-22 ENCOUNTER — Emergency Department (HOSPITAL_COMMUNITY)
Admission: EM | Admit: 2016-08-22 | Discharge: 2016-08-22 | Disposition: A | Discharge status: Home / Self Care | Payer: 59 | Attending: Emergency Medicine | Admitting: Emergency Medicine

## 2016-08-22 ENCOUNTER — Emergency Department (HOSPITAL_COMMUNITY): Payer: 59

## 2016-08-22 ENCOUNTER — Encounter (HOSPITAL_COMMUNITY): Payer: Self-pay | Admitting: Emergency Medicine

## 2016-08-22 DIAGNOSIS — Y999 Unspecified external cause status: Secondary | ICD-10-CM | POA: Diagnosis not present

## 2016-08-22 DIAGNOSIS — W231XXA Caught, crushed, jammed, or pinched between stationary objects, initial encounter: Secondary | ICD-10-CM | POA: Diagnosis not present

## 2016-08-22 DIAGNOSIS — F17210 Nicotine dependence, cigarettes, uncomplicated: Secondary | ICD-10-CM | POA: Insufficient documentation

## 2016-08-22 DIAGNOSIS — Z79899 Other long term (current) drug therapy: Secondary | ICD-10-CM | POA: Insufficient documentation

## 2016-08-22 DIAGNOSIS — Z23 Encounter for immunization: Secondary | ICD-10-CM | POA: Diagnosis not present

## 2016-08-22 DIAGNOSIS — Y939 Activity, unspecified: Secondary | ICD-10-CM | POA: Diagnosis not present

## 2016-08-22 DIAGNOSIS — S62635B Displaced fracture of distal phalanx of left ring finger, initial encounter for open fracture: Secondary | ICD-10-CM | POA: Diagnosis not present

## 2016-08-22 DIAGNOSIS — S62633B Displaced fracture of distal phalanx of left middle finger, initial encounter for open fracture: Secondary | ICD-10-CM | POA: Insufficient documentation

## 2016-08-22 DIAGNOSIS — S6992XA Unspecified injury of left wrist, hand and finger(s), initial encounter: Secondary | ICD-10-CM | POA: Diagnosis present

## 2016-08-22 DIAGNOSIS — S62609B Fracture of unspecified phalanx of unspecified finger, initial encounter for open fracture: Secondary | ICD-10-CM

## 2016-08-22 DIAGNOSIS — I10 Essential (primary) hypertension: Secondary | ICD-10-CM | POA: Diagnosis not present

## 2016-08-22 DIAGNOSIS — R0602 Shortness of breath: Secondary | ICD-10-CM | POA: Diagnosis not present

## 2016-08-22 DIAGNOSIS — J449 Chronic obstructive pulmonary disease, unspecified: Secondary | ICD-10-CM | POA: Insufficient documentation

## 2016-08-22 DIAGNOSIS — Y929 Unspecified place or not applicable: Secondary | ICD-10-CM | POA: Diagnosis not present

## 2016-08-22 MED ORDER — CEFUROXIME AXETIL 500 MG PO TABS: 500.0000 mg | ORAL_TABLET | Freq: Two times a day (BID) | ORAL | 0 refills | Status: DC

## 2016-08-22 MED ORDER — OXYCODONE-ACETAMINOPHEN 5-325 MG PO TABS: 1.0000 | ORAL_TABLET | Freq: Four times a day (QID) | ORAL | 0 refills | Status: DC | PRN

## 2016-08-22 MED ORDER — LIDOCAINE HCL (PF) 2 % IJ SOLN
10.0000 mL | Freq: Once | INTRAMUSCULAR | Status: DC
  Filled 2016-08-22: qty 10

## 2016-08-22 MED ORDER — IBUPROFEN 600 MG PO TABS: 600.0000 mg | ORAL_TABLET | Freq: Four times a day (QID) | ORAL | 0 refills | Status: DC | PRN

## 2016-08-22 MED ORDER — POVIDONE-IODINE 10 % EX SOLN
CUTANEOUS | Status: AC
  Filled 2016-08-22: qty 118

## 2016-08-22 MED ORDER — TETANUS-DIPHTH-ACELL PERTUSSIS 5-2.5-18.5 LF-MCG/0.5 IM SUSP
0.5000 mL | Freq: Once | INTRAMUSCULAR | Status: AC
Start: 2016-08-22 — End: 2016-08-22
  Administered 2016-08-22: 0.5 mL via INTRAMUSCULAR
  Filled 2016-08-22: qty 0.5

## 2016-08-22 NOTE — ED Provider Notes (Signed)
[facility] DEPT Provider Note   CSN: [id number] Arrival date & time: 08/22/16  1419     History   Chief Complaint Chief Complaint  Patient presents with  . Finger Injury    HPI [name] is a 61 y.o. male.  Patient is a 61 year old male who presents to the emergency department with complaint of pain of the ring finger and middle finger of the left hand.  The patient states that approximately 2 PM today he got his middle finger and ring finger caught in a car window. He was able to get the fingers out, but sustained injury. He was immediate swelling, accompanied by a rather severe throbbing type pain. He denies any previous injury or trauma to the left hand. He denies being on any anticoagulation medications. No other injuries reported.      Past Medical History:  Diagnosis Date  . COPD (chronic obstructive pulmonary disease) ([place])   . Hypertension   . Pancreatitis   . Pneumonia   . Pneumothorax   . Shortness of breath     Patient Active Problem List   Diagnosis Date Noted  . Pancreatitis, alcoholic 0000000  . Alcoholism ([place]) 11/11/2011  . Tobacco abuse 11/11/2011  . Thrombocytopenia ([place]) 11/11/2011  . HYPERSOMNIA, ASSOCIATED WITH SLEEP APNEA 09/27/2009  . COPD 09/07/2009  . Pneumothorax 09/07/2009  . BURSITIS 09/07/2009  . FATIGUE / MALAISE 09/07/2009  . SHORTNESS OF BREATH 09/07/2009  . PNEUMOTHORAX 09/07/2009    Past Surgical History:  Procedure Laterality Date  . THORACOTOMY         Home Medications    Prior to Admission medications   Medication Sig Start Date End Date Taking? Authorizing Provider  diclofenac sodium (VOLTAREN) 1 % GEL Apply 2 g topically 4 (four) times daily. 07/28/16   Pattricia Boss, MD  doxycycline (VIBRAMYCIN) 100 MG capsule Take 1 capsule (100 mg total) by mouth 2 (two) times daily. Patient not taking: Reported on 04/30/2016 12/02/14   Audelia Hives Presson, PA  ibuprofen (ADVIL,MOTRIN) 200 MG tablet Take 200 mg  by mouth as needed. For pain    Historical Provider, MD  Multiple Vitamin (MULITIVITAMIN WITH MINERALS) TABS Take 1 tablet by mouth daily.    Historical Provider, MD    Family History History reviewed. No pertinent family history.  Social History Social History  Substance Use Topics  . Smoking status: Current Every Day Smoker    Packs/day: 1.00    Years: 30.00    Types: Cigarettes  . Smokeless tobacco: Former Systems developer  . Alcohol use 0.0 oz/week     Comment: daily     Allergies   Patient has no known allergies.   Review of Systems Review of Systems  Constitutional: Negative for activity change.       All ROS Neg except as noted in HPI  HENT: Negative for nosebleeds.   Eyes: Negative for photophobia and discharge.  Respiratory: Positive for shortness of breath. Negative for cough and wheezing.   Cardiovascular: Negative for chest pain and palpitations.  Gastrointestinal: Negative for abdominal pain and blood in stool.  Genitourinary: Negative for dysuria, frequency and hematuria.  Musculoskeletal: Positive for arthralgias and back pain. Negative for neck pain.  Skin: Negative.   Neurological: Negative for dizziness, seizures and speech difficulty.  Psychiatric/Behavioral: Negative for confusion and hallucinations.     Physical Exam Updated Vital Signs BP 155/98 (BP Location: Right Arm)   Pulse 93   Temp 98.1 F (36.7 C) (Oral)   Resp  16   Ht 6' (1.829 m)   Wt 95.3 kg   SpO2 92%   BMI 28.48 kg/m   Physical Exam  Constitutional: He is oriented to person, place, and time. He appears well-developed and well-nourished.  Non-toxic appearance.  HENT:  Head: Normocephalic.  Right Ear: Tympanic membrane and external ear normal.  Left Ear: Tympanic membrane and external ear normal.  Eyes: EOM and lids are normal. Pupils are equal, round, and reactive to light.  Neck: Normal range of motion. Neck supple. Carotid bruit is not present.  Cardiovascular: Normal rate, regular  rhythm, normal heart sounds, intact distal pulses and normal pulses.   Pulmonary/Chest: Breath sounds normal. No respiratory distress.  Abdominal: Soft. Bowel sounds are normal. There is no tenderness. There is no guarding.  Musculoskeletal: Normal range of motion. He exhibits tenderness.  There is full range of motion of the left shoulder, elbow, and wrists. There is no deformity of the forearm area. There is deformity of the distal ring finger and long finger. There is a rupture of the nail from the cuticle area and laceration present. Is mild swelling present. The radial pulses 2+. Capillary refill is less than 2 seconds bilaterally.  Lymphadenopathy:       Head (right side): No submandibular adenopathy present.       Head (left side): No submandibular adenopathy present.    He has no cervical adenopathy.  Neurological: He is alert and oriented to person, place, and time. He has normal strength. No cranial nerve deficit or sensory deficit. Coordination normal.  No motor or sensory deficits appreciated on neurologic examination.  Skin: Skin is warm and dry.  Psychiatric: He has a normal mood and affect. His speech is normal.  Nursing note and vitals reviewed.    ED Treatments / Results  Labs (all labs ordered are listed, but only abnormal results are displayed) Labs Reviewed - No data to display  EKG  EKG Interpretation None       Radiology Dg Hand 2 View Left  Result Date: 08/22/2016 CLINICAL DATA:  Crush middle and ring finger left hand.  Pain. EXAM: LEFT HAND - 2 VIEW COMPARISON:  None. FINDINGS: There is a oblique fracture of the third distal phalanx without significant displacement or angulation. There is an oblique fracture of the fourth distal phalanx with minimal displacement. 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 There is mild osteoarthritis of the second MCP joint. Soft tissues are unremarkable. IMPRESSION: Oblique fracture of the third  distal phalanx without significant displacement or angulation. Oblique fracture of the fourth distal phalanx with minimal displacement. Electronically Signed   By: Kathreen Devoid   On: 08/22/2016 16:34    Procedures Procedures (including critical care time) LACERATION REPAIR LEFT 3RD AND 4TH FINGERS. Patient is a laceration of the ring finger and long finger of the left hand. These lacerations and injuries involve the nail.  I discussed the need for repair of these lacerations with the patient in terms which he understands. He is in agreement with the repair.  The patient was identified by arm band. The ring finger and the long finger were painted with Betadine. A digital block was achieved with 2% plain lidocaine. Following this the area was soaked and cleansed with Betadine and irrigated with saline. The wounds were more thoroughly investigated. The patient has disruption from the cuticle of the long finger, and a more significant disruption of the nail on the ring finger. There  is also a laceration at the ulnar lateral area of the long finger and the ring finger.  The patient was draped in the usual sterile manner. One stitch was placed in the ulnar lateral aspect of the long finger. 2 stitches of 4-0 Prolene were placed in the nail with reapproximation of the nail in proper position on. One stitch of 4-0 Prolene was placed in the ulnar lateral area. Sterile dressing was applied. Patient tolerated the procedure without problem.  FRACTURE CARE LEFT 3RD and 4TH FINGERS Patient sustained fractures of the distal left third and fourth fingers as a result of an accident with a car window.  I discussed the fractures with the patient in terms which he understood, including using a paper copy of the x-ray. Questions were answered.  I discussed with the patient the need for mobilization and treatment concerning the fractures. The patient is in agreement and gives permission to proceed. The patient was  identified by arm band. A plaster splint was fashioned to cover the distal portion of the third and fourth finger and then immobilize on down to the wrist on the left. Ace bandage was applied. Following the procedure the patient did not have any problem with pain or sensation of tightness or change in color or temperature. The patient tolerated the procedure without problem. Patient will be treated with Percocet and ibuprofen for pain.  Medications Ordered in ED Medications - No data to display   Initial Impression / Assessment and Plan / ED Course  I have reviewed the triage vital signs and the nursing notes.  Pertinent labs & imaging results that were available during my care of the patient were reviewed by me and considered in my medical decision making (see chart for details).     **I have reviewed nursing notes, vital signs, and all appropriate lab and imaging results for this patient.*  Final Clinical Impressions(s) / ED Diagnoses  Patient sustained a laceration to the left third and fourth finger as well as fracture to the distal third and fourth finger. The lacerations/nail injury were repaired. The fractures were mobilized and splinted. The patient is given a prescription for Percocet and ibuprofen for pain. We discussed the importance of keeping the hand elevated as much as possible. We also discussed the need for follow-up by orthopedics. Patient will follow with Dr. Aline Brochure next week. He will return to the emergency department sooner if any changes, problems, or concerns.    Final diagnoses:  None    New Prescriptions New Prescriptions   No medications on file     Lily Kocher, PA-C 08/22/16 1948    Dorie Rank, MD 08/24/16 [phone]

## 2016-08-22 NOTE — ED Triage Notes (Signed)
Pt reports getting middle and ring fingers of left hand caught in a car window around 2pm today.

## 2016-08-22 NOTE — Discharge Instructions (Signed)
You had a laceration and disruption of the nail from the cuticle area on the ring finger and the long finger of your left hand. These were repaired with sutures. The sutures should be removed in 7 or 8 days. You have a fracture of the distal portion of the left ring and long finger. Please see Dr. Aline Brochure as soon as possible for concerning these. Please leave your splint in place until seen by Dr. Aline Brochure. Keep her splint clean and dry. Please keep your hand above your heart is much as possible, as this will improve your pain. Please use Ceftin 2 times daily with a meal, use ibuprofen every 6 hours for mild pain, use Percocet for more severe pain. Percocet may cause drowsiness, and/or constipation. Please do not drive, drink alcohol, operate machinery, or participate in activities requiring concentration when taking the Percocet. It is also recommended to use a stool softener while taking the the Percocet as it may cause significant constipation.

## 2016-08-25 MED FILL — Oxycodone w/ Acetaminophen Tab 5-325 MG: ORAL | Qty: 6 | Status: AC

## 2016-08-26 ENCOUNTER — Encounter: Payer: Self-pay | Admitting: Orthopedic Surgery

## 2016-08-26 ENCOUNTER — Ambulatory Visit (INDEPENDENT_AMBULATORY_CARE_PROVIDER_SITE_OTHER): Payer: 59 | Admitting: Orthopedic Surgery

## 2016-08-26 VITALS — BP 123/90 | HR 96 | Wt 211.0 lb

## 2016-08-26 DIAGNOSIS — S62665A Nondisplaced fracture of distal phalanx of left ring finger, initial encounter for closed fracture: Secondary | ICD-10-CM | POA: Diagnosis not present

## 2016-08-26 DIAGNOSIS — S62663B Nondisplaced fracture of distal phalanx of left middle finger, initial encounter for open fracture: Secondary | ICD-10-CM

## 2016-08-26 MED ORDER — OXYCODONE-ACETAMINOPHEN 5-325 MG PO TABS: 1.0000 | ORAL_TABLET | Freq: Four times a day (QID) | ORAL | 0 refills | Status: DC | PRN

## 2016-08-26 NOTE — Progress Notes (Signed)
Patient ID: [name], male   DOB: 23-Oct-1955, 61 y.o.   MRN: [id number]  Chief Complaint  Patient presents with  . Hand Injury    LEFT RING AND MIDDLE FINGER FRACTURES, DOI 08/22/16    HPI [name] is a 61 y.o. male.  61 year old male left hand fingertips caught in a power window. HPI The patient was seen in emergency room for distal phalanx fractures of the left ring finger and long finger. He was treated with irrigation and closure and retention of both nails.  Complains of pain swelling throbbing and decreased range of motion but no sensory loss. He feels much better than he did at the initial injury Review of Systems Review of Systems  Constitutional: Negative for chills.  Neurological: Negative for numbness.   Past Medical History:  Diagnosis Date  . COPD (chronic obstructive pulmonary disease) ([place])   . Hypertension   . Pancreatitis   . Pneumonia   . Pneumothorax   . Shortness of breath     Past Surgical History:  Procedure Laterality Date  . THORACOTOMY      Social History Social History  Substance Use Topics  . Smoking status: Current Every Day Smoker    Packs/day: 1.00    Years: 30.00    Types: Cigarettes  . Smokeless tobacco: Former Systems developer  . Alcohol use 0.0 oz/week     Comment: daily    No Known Allergies  Current Outpatient Prescriptions  Medication Sig Dispense Refill  . cefUROXime (CEFTIN) 500 MG tablet Take 1 tablet (500 mg total) by mouth 2 (two) times daily with a meal. 14 tablet 0  . diclofenac sodium (VOLTAREN) 1 % GEL Apply 2 g topically 4 (four) times daily. 1 Tube 0  . doxycycline (VIBRAMYCIN) 100 MG capsule Take 1 capsule (100 mg total) by mouth 2 (two) times daily. (Patient not taking: Reported on 04/30/2016) 14 capsule 0  . ibuprofen (ADVIL,MOTRIN) 600 MG tablet Take 1 tablet (600 mg total) by mouth every 6 (six) hours as needed. 30 tablet 0  . Multiple Vitamin (MULITIVITAMIN WITH MINERALS) TABS Take 1 tablet by mouth daily.     Marland Kitchen oxyCODONE-acetaminophen (PERCOCET/ROXICET) 5-325 MG tablet Take 1-2 tablets by mouth every 6 (six) hours as needed for severe pain. 6 tablet 0  . oxyCODONE-acetaminophen (PERCOCET/ROXICET) 5-325 MG tablet Take 1 tablet by mouth every 6 (six) hours as needed. 20 tablet 0   No current facility-administered medications for this visit.     Physical Exam There were no vitals taken for this visit. Physical Exam  Constitutional: He is oriented to person, place, and time. He appears well-developed and well-nourished. No distress.  Cardiovascular: Normal rate and intact distal pulses.   Neurological: He is alert and oriented to person, place, and time.  Skin: He is not diaphoretic.  Psychiatric: He has a normal mood and affect. His behavior is normal. Judgment and thought content normal.   Ambulatory status is normal  The left ring and  long have multiple sutures retaining the nail He has no leg flexion extension cannot be assessed because pain sensory normal capillary refill is normal in the hand** Lymph node palpation is normal in the epitrochlear region   The opposite extremity exhibits normal range of motion stability and strength neurovascular exam is intact, lymph nodes are negative and there is no swelling or tenderness   Data Reviewed  independent image interpretation : 3 views left hand  Nondisplaced minimally angulated fracture of the long and ring  finger of the left hand  Assessment    Encounter Diagnoses  Name Primary?  . Open nondisplaced fracture of distal phalanx of left middle finger, initial encounter Yes  . Closed nondisplaced fracture of distal phalanx of left ring finger, initial encounter      [place] controlled substance reporting system reviewed   Plan Recheck in 2 weeks, no x-rays needed  *patient requested protection 2 fingers   Arther Abbott, MD 08/26/2016 10:05 AM

## 2016-08-26 NOTE — Patient Instructions (Signed)
Active range of motion  Continue medications as ordered

## 2016-09-09 ENCOUNTER — Ambulatory Visit (INDEPENDENT_AMBULATORY_CARE_PROVIDER_SITE_OTHER): Payer: Self-pay | Admitting: Orthopedic Surgery

## 2016-09-09 DIAGNOSIS — S62663B Nondisplaced fracture of distal phalanx of left middle finger, initial encounter for open fracture: Secondary | ICD-10-CM

## 2016-09-09 DIAGNOSIS — S62665A Nondisplaced fracture of distal phalanx of left ring finger, initial encounter for closed fracture: Secondary | ICD-10-CM

## 2016-09-09 NOTE — Progress Notes (Signed)
FOLLOW UP VISIT   Patient ID: [name], male   DOB: 26-Apr-1956, 61 y.o.   MRN: [id number]  Chief Complaint  Patient presents with  . Follow-up    Recheck left ring and middle finger fractures, DOI 08-22-16.    HPI HELDER BASCOM is a 61 y.o. male.   HPI  61 year old male 43 caught in a power window. He came in today for recheck and suture removal  Review of Systems Review of Systems     Physical Exam  The patient has stiffness of the IP joint of the ring and long finger. The sutures were removed. There are no signs of infection   MEDICAL DECISION MAKING  DATA    DIAGNOSIS  Encounter Diagnoses  Name Primary?  . Open nondisplaced fracture of distal phalanx of left middle finger, initial encounter Yes  . Closed nondisplaced fracture of distal phalanx of left ring finger, initial encounter      PLAN(RISK)    Soaks with Epsom salt water and district urgent until fingers were completely returned to normal. Okay to use splints for protection.

## 2016-09-09 NOTE — Patient Instructions (Signed)
SOAK WARM WATER AND SALT

## 2016-10-28 ENCOUNTER — Other Ambulatory Visit: Payer: Self-pay | Admitting: Urology

## 2016-10-28 DIAGNOSIS — R972 Elevated prostate specific antigen [PSA]: Secondary | ICD-10-CM

## 2016-12-02 ENCOUNTER — Encounter (HOSPITAL_COMMUNITY): Payer: Self-pay

## 2016-12-02 ENCOUNTER — Ambulatory Visit (HOSPITAL_COMMUNITY)
Admission: RE | Admit: 2016-12-02 | Discharge: 2016-12-02 | Disposition: A | Discharge status: Home / Self Care | Payer: 59 | Source: Ambulatory Visit | Attending: Urology | Admitting: Urology

## 2016-12-02 DIAGNOSIS — C61 Malignant neoplasm of prostate: Secondary | ICD-10-CM | POA: Insufficient documentation

## 2016-12-02 DIAGNOSIS — R972 Elevated prostate specific antigen [PSA]: Secondary | ICD-10-CM | POA: Insufficient documentation

## 2016-12-02 MED ORDER — GENTAMICIN SULFATE 40 MG/ML IJ SOLN
INTRAMUSCULAR | Status: AC
  Administered 2016-12-02: 160 mg via INTRAMUSCULAR
  Filled 2016-12-02: qty 4

## 2016-12-02 MED ORDER — GENTAMICIN SULFATE 40 MG/ML IJ SOLN
160.0000 mg | Freq: Once | INTRAMUSCULAR | Status: AC
  Administered 2016-12-02: 160 mg via INTRAMUSCULAR

## 2016-12-02 MED ORDER — LIDOCAINE HCL (PF) 2 % IJ SOLN
INTRAMUSCULAR | Status: AC
  Administered 2016-12-02: 10 mL
  Filled 2016-12-02: qty 10

## 2016-12-02 NOTE — Sedation Documentation (Signed)
Patient denies pain and is resting comfortably.  

## 2016-12-02 NOTE — Sedation Documentation (Signed)
Vital signs stable. 

## 2016-12-02 NOTE — Discharge Instructions (Signed)
Transrectal Ultrasound-Guided Biopsy °A transrectal ultrasound-guided biopsy is a procedure to remove samples of tissue from your prostate using ultrasound images to guide the procedure. The procedure is usually done to evaluate the prostate gland of men who have an elevated prostate-specific antigen (PSA). PSA is a blood test to screen for prostate cancer. The biopsy samples are taken to check for prostate cancer. °Tell a health care provider about: °· Any allergies you have. °· All medicines you are taking, including vitamins, herbs, eye drops, creams, and over-the-counter medicines. °· Any problems you or family members have had with anesthetic medicines. °· Any blood disorders you have. °· Any surgeries you have had. °· Any medical conditions you have. °What are the risks? °Generally, this is a safe procedure. However, as with any procedure, problems can occur. Possible problems include: °· Infection of your prostate. °· Bleeding from your rectum or blood in your urine. °· Difficulty urinating. °· Nerve damage (this is usually temporary). °· Damage to surrounding structures such as blood vessels, organs, and muscles, which would require other procedures. °What happens before the procedure? °· Do not eat or drink anything after midnight on the night before the procedure or as directed by your health care provider. °· Take medicines only as directed by your health care provider. °· Your health care provider may have you stop taking certain medicines 5-7 days before the procedure. °· You will be given an enema before the procedure. During an enema, a liquid is injected into your rectum to clear out waste. °· You may have lab tests the day of your procedure. °· Plan to have someone take you home after the procedure. °What happens during the procedure? °· You will be given medicine to help you relax (sedative) before the procedure. An IV tube will be inserted into one of your veins and used to give fluids and  medicine. °· You will be given antibiotic medicine to reduce the risk of an infection. °· You will be placed on your side for the procedure. °· A probe with lubricated gel will be placed into your rectum, and images will be taken of your prostate and surrounding structures. °· Numbing medicine will be injected into the prostate before the biopsy samples are taken. °· A biopsy needle will then be inserted and guided to your prostate with the use of the ultrasound images. °· Samples of prostate tissue will be taken, and the needle will then be removed. °· The biopsy samples will be sent to a lab to be analyzed. Results are usually back in 2-3 days. °What happens after the procedure? °· You will be taken to a recovery area where you will be monitored. °· You may have some discomfort in the rectal area. You will be given pain medicines to control this. °· You may be allowed to go home the same day, or you may need to stay in the hospital overnight. °This information is not intended to replace advice given to you by your health care provider. Make sure you discuss any questions you have with your health care provider. °Document Released: 12/05/2013 Document Revised: 12/27/2015 Document Reviewed: 03/09/2013 °Elsevier Interactive Patient Education © 2017 [employer] ° °

## 2016-12-25 ENCOUNTER — Encounter: Payer: Self-pay | Admitting: Radiation Oncology

## 2017-01-01 NOTE — Progress Notes (Signed)
GU Location of Tumor / Histology: prostatic adenocarcinoma  If Prostate Cancer, Gleason Score is (3 + 4) on 12/02/2016 and PSA is (5.99) on 10/24/2016  Sherran Needs presented  Was seen a few years ago by Dr. Maryland Pink in [place], no tissues obtained.  Current day was referred by Dr. Karie Kirks for elevated PSA and thus referrred to Dr. Diona Fanti for biopsy.  Denies any urinary issues.  Biopsies of prostate on 12/02/2016   Past/Anticipated interventions by urology, if any: Prostate needle Biopsy 12/02/2016  Past/Anticipated interventions by medical oncology, if any: no  Weight changes, if any: no  Bowel/Bladder complaints, if any: no   Nausea/Vomiting, if any: no  Pain issues, if any:  no  SAFETY ISSUES:  Prior radiation? no  Pacemaker/ICD? no  Possible current pregnancy? no  Is the patient on methotrexate? 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  Previous smoker who quit 10/03/2015, drinks alcohol 3 per day.  Patient expressed desire to have brachytherapy over surgery at this time.    Vitals:   01/05/17 1327  BP: 133/87  Pulse: 81  Resp: 20  Temp: 98.5 F (36.9 C)  TempSrc: Oral  SpO2: 98%  Weight: 221 lb 6.4 oz (100.4 kg)   Wt Readings from Last 3 Encounters:  01/05/17 221 lb 6.4 oz (100.4 kg)  08/26/16 211 lb (95.7 kg)  08/22/16 210 lb (95.3 kg)

## 2017-01-05 ENCOUNTER — Ambulatory Visit
Admission: RE | Admit: 2017-01-05 | Discharge: 2017-01-05 | Disposition: A | Discharge status: Home / Self Care | Payer: 59 | Source: Ambulatory Visit | Attending: Radiation Oncology | Admitting: Radiation Oncology

## 2017-01-05 ENCOUNTER — Encounter: Payer: Self-pay | Admitting: Medical Oncology

## 2017-01-05 ENCOUNTER — Encounter: Payer: Self-pay | Admitting: Radiation Oncology

## 2017-01-05 ENCOUNTER — Ambulatory Visit: Payer: 59 | Admitting: Radiation Oncology

## 2017-01-05 VITALS — BP 133/87 | HR 81 | Temp 98.5°F | Resp 20 | Wt 221.4 lb

## 2017-01-05 DIAGNOSIS — Z79899 Other long term (current) drug therapy: Secondary | ICD-10-CM | POA: Insufficient documentation

## 2017-01-05 DIAGNOSIS — C61 Malignant neoplasm of prostate: Secondary | ICD-10-CM

## 2017-01-05 DIAGNOSIS — Z9889 Other specified postprocedural states: Secondary | ICD-10-CM | POA: Insufficient documentation

## 2017-01-05 DIAGNOSIS — J449 Chronic obstructive pulmonary disease, unspecified: Secondary | ICD-10-CM | POA: Diagnosis not present

## 2017-01-05 DIAGNOSIS — Z809 Family history of malignant neoplasm, unspecified: Secondary | ICD-10-CM | POA: Diagnosis not present

## 2017-01-05 DIAGNOSIS — I10 Essential (primary) hypertension: Secondary | ICD-10-CM | POA: Diagnosis not present

## 2017-01-05 DIAGNOSIS — K859 Acute pancreatitis without necrosis or infection, unspecified: Secondary | ICD-10-CM | POA: Diagnosis not present

## 2017-01-05 HISTORY — DX: Malignant neoplasm of prostate: C61

## 2017-01-05 NOTE — Progress Notes (Signed)
Radiation Oncology         [phone] ________________________________  Initial Outpatient Consultation  Name: [name] MRN: [id number]  Date: 01/05/2017  DOB: 12/21/1955  CH:ENIDPOEU, Richardson Landry, MD  Franchot Gallo, MD   REFERRING PHYSICIAN: Franchot Gallo, MD  DIAGNOSIS: 61 y.o. gentleman with Stage T2b adenocarcinoma of the prostate with Gleason Score of 3+4, and PSA of 5.99    ICD-10-CM   1. Malignant neoplasm of prostate ([place]) C61     HISTORY OF PRESENT ILLNESS: [name] is a 61 y.o. male with a diagnosis of prostate cancer. He was noted to have an elevated PSA of 5.99 by his primary care physician, Dr. Karie Kirks. Accordingly, he was referred for evaluation in urology by Dr. Diona Fanti on 10/24/16, digital rectal examination was performed at that time revealing a 4 mm left apical nodule. The patient proceeded to transrectal ultrasound with 12 biopsies of the prostate on 12/02/16. The prostate volume measured 17 cc. Out of 12 core biopsies,6 were positive. The maximum Gleason score was 3+4, and this was seen in left mid lateral, left apex lateral, and left mid.  The patient reviewed the biopsy results with his urologist and he has kindly been referred today for discussion of potential radiation treatment options.   PREVIOUS RADIATION THERAPY: No  PAST MEDICAL HISTORY:  Past Medical History:  Diagnosis Date  . COPD (chronic obstructive pulmonary disease) ([place])   . Hypertension   . Pancreatitis   . Pneumonia   . Pneumothorax   . Shortness of breath       PAST SURGICAL HISTORY: Past Surgical History:  Procedure Laterality Date  . THORACOTOMY      FAMILY HISTORY:  Family History  Problem Relation Age of Onset  . Cancer Neg Hx     SOCIAL HISTORY:  Social History   Social History  . Marital status: Single    Spouse name: N/A  . Number of children: N/A  . Years of education: N/A   Occupational History  . Not on file.   Social History Main  Topics  . Smoking status: Never Smoker  . Smokeless tobacco: Former Systems developer  . Alcohol use 0.0 oz/week     Comment: daily  . Drug use: No  . Sexual activity: Not Currently   Other Topics Concern  . Not on file   Social History Narrative  . No narrative on file    ALLERGIES: Patient has no known allergies.  MEDICATIONS:  Current Outpatient Prescriptions  Medication Sig Dispense Refill  . diclofenac sodium (VOLTAREN) 1 % GEL Apply 2 g topically 4 (four) times daily. (Patient not taking: Reported on 01/05/2017) 1 Tube 0  . ibuprofen (ADVIL,MOTRIN) 600 MG tablet Take 1 tablet (600 mg total) by mouth every 6 (six) hours as needed. (Patient not taking: Reported on 01/05/2017) 30 tablet 0  . oxyCODONE-acetaminophen (PERCOCET/ROXICET) 5-325 MG tablet Take 1 tablet by mouth every 6 (six) hours as needed. (Patient not taking: Reported on 01/05/2017) 30 tablet 0   No current facility-administered medications for this encounter.     REVIEW OF SYSTEMS:  On review of systems, the patient reports that he is doing well overall. He denies any chest pain, shortness of breath, cough, fevers, chills, night sweats, unintended weight changes. He denies any bowel disturbances, and denies abdominal pain, nausea or vomiting. He denies any new musculoskeletal or joint aches or pains. His IPSS was 1, indicating mild urinary symptoms of urinary urgency. He is able to complete sexual activity  with most attempts. A complete review of systems is obtained and is otherwise negative.    PHYSICAL EXAM:  Wt Readings from Last 3 Encounters:  01/05/17 221 lb 6.4 oz (100.4 kg)  08/26/16 211 lb (95.7 kg)  08/22/16 210 lb (95.3 kg)   Temp Readings from Last 3 Encounters:  01/05/17 98.5 F (36.9 C) (Oral)  12/02/16 98.3 F (36.8 C) (Oral)  08/22/16 98.1 F (36.7 C) (Oral)   BP Readings from Last 3 Encounters:  01/05/17 133/87  12/02/16 (!) 147/96  08/26/16 123/90   Pulse Readings from Last 3 Encounters:  01/05/17 81    12/02/16 84  08/26/16 96   Pain Assessment Pain Score: 0-No pain/10  In general this is a well appearing caucasian male in no acute distress. He is alert and oriented x4 and appropriate throughout the examination. HEENT reveals that the patient is normocephalic, atraumatic. EOMs are intact. PERRLA. Skin is intact without any evidence of gross lesions. Cardiovascular exam reveals a regular rate and rhythm, no clicks rubs or murmurs are auscultated. Chest is clear to auscultation bilaterally. Lymphatic assessment is performed and does not reveal any adenopathy in the cervical, supraclavicular, axillary, or inguinal chains. Abdomen has active bowel sounds in all quadrants and is intact. The abdomen is soft, non tender, non distended. Lower extremities are negative for pretibial pitting edema, deep calf tenderness, cyanosis or clubbing.   KPS =100  100 - Normal; no complaints; no evidence of disease. 90   - Able to carry on normal activity; minor signs or symptoms of disease. 80   - Normal activity with effort; some signs or symptoms of disease. 59   - Cares for self; unable to carry on normal activity or to do active work. 60   - Requires occasional assistance, but is able to care for most of his personal needs. 50   - Requires considerable assistance and frequent medical care. 44   - Disabled; requires special care and assistance. 72   - Severely disabled; hospital admission is indicated although death not imminent. 51   - Very sick; hospital admission necessary; active supportive treatment necessary. 10   - Moribund; fatal processes progressing rapidly. 0     - Dead  Karnofsky DA, Abelmann [place], Craver LS and Burchenal [facility] [phone]) The use of the nitrogen mustards in the palliative treatment of carcinoma: with particular reference to bronchogenic carcinoma Cancer 1 634-56  LABORATORY DATA:  Lab Results  Component Value Date   WBC 9.2 01/31/2012   HGB 14.7 01/31/2012   HCT 43.1 01/31/2012    MCV 100.7 (H) 01/31/2012   PLT 123 (L) 01/31/2012   Lab Results  Component Value Date   NA 133 (L) 02/01/2012   K 3.7 02/01/2012   CL 97 02/01/2012   CO2 29 02/01/2012   Lab Results  Component Value Date   ALT 23 02/01/2012   AST 23 02/01/2012   ALKPHOS 71 02/01/2012   BILITOT 0.5 02/01/2012     RADIOGRAPHY: No results found.    IMPRESSION/PLAN: 1. 61 y.o. gentleman with intermediate risk Stage T2b adenocarcinoma of the prostate with Gleason Score of 3+4, and PSA of 5.99. We reviewed the patient's pathology and reviewed the the implications of T-stage, Gleason's Score, and PSA on decision-making and outcomes in prostate cancer.  We discussed radiation treatment in the management of prostate cancer with regard to the logistics and delivery of external beam radiation treatment as well as the logistics and delivery of prostate brachytherapy.  We compared and contrasted each of these approaches and also compared these against prostatectomy.  The patient expressed interest in prostate brachytherapy. We will share this with Dr. Diona Fanti and move forward with scheduling the procedure in the near future.        Carola Rhine, PAC Seen with    Sheral Apley. Tammi Klippel, M.D.    This document serves as a record of services personally performed by Tyler Pita, MD and Shona Simpson, PA-C. It was created on their behalf by Bethann Humble, a trained medical scribe. The creation of this record is based on the scribe's personal observations and the provider's statements to them. This document has been checked and approved by the attending provider.

## 2017-01-05 NOTE — Progress Notes (Signed)
Please see the Nurse Progress Note in the MD Initial Consult Encounter for this patient. 

## 2017-01-05 NOTE — Progress Notes (Signed)
Introduced myself to [name] and his wife as the prostate nurse navigator and my role. He is very interested in brachytherapy for treatment of his prostate cancer. I gave them my business card and will continue to follow.

## 2017-01-12 ENCOUNTER — Encounter: Payer: Self-pay | Admitting: Radiation Oncology

## 2017-01-13 ENCOUNTER — Telehealth: Payer: Self-pay | Admitting: Radiation Oncology

## 2017-01-13 ENCOUNTER — Telehealth: Payer: Self-pay | Admitting: *Deleted

## 2017-01-13 NOTE — Telephone Encounter (Signed)
Received voicemail message from patient inquiring about "scheduling a simulation." Informed Shona Simpson, PA-C that the patient wishes to move forward with treatment. Bryson Ha in turn contacted [facility]. Enid Derry confirms she will contact the patient soon with his upcoming appointments. Phoned patient back. No answer. Left message explaining his voicemail had been received, our scheduler, Enid Derry, is working on scheduling his appointments and will call him soon.

## 2017-01-13 NOTE — Telephone Encounter (Signed)
Called patient to ask question, lvm for a return call 

## 2017-01-19 ENCOUNTER — Telehealth: Payer: Self-pay | Admitting: *Deleted

## 2017-01-19 ENCOUNTER — Encounter: Payer: Self-pay | Admitting: Radiation Oncology

## 2017-01-19 DIAGNOSIS — C61 Malignant neoplasm of prostate: Secondary | ICD-10-CM

## 2017-01-19 HISTORY — DX: Malignant neoplasm of prostate: C61

## 2017-01-19 NOTE — Telephone Encounter (Signed)
CALLED PATIENT TO INFORM OF PRE-SEED PLANNING CT AND HIS IMPLANT, SPOKE WITH PATIENT AND HE IS AWARE , SPOKE WITH PATIENT AND HE IS AWARE OF THESE APPTS.

## 2017-01-20 ENCOUNTER — Telehealth: Payer: Self-pay | Admitting: *Deleted

## 2017-01-20 ENCOUNTER — Other Ambulatory Visit: Payer: Self-pay | Admitting: Urology

## 2017-01-20 NOTE — Telephone Encounter (Signed)
CALLED PATIENT TO INFORM OF NEW IMPLANT DATE, LVM FOR A RETURN CALL

## 2017-01-27 ENCOUNTER — Telehealth: Payer: Self-pay

## 2017-01-27 NOTE — Telephone Encounter (Signed)
Becky from [place] office called to see if we can get patient in this week for an Esophageal Stricture. He is getting choked with food and has to vomit it back up. He is a [facility] patient,but has not been seen since 2009. Please advise

## 2017-01-28 NOTE — Telephone Encounter (Signed)
I have patient coming into the office on Monday 02/02/17 @ 8:00 am. He does not have trouble  Swallowing all the time and right now he is ok.

## 2017-01-29 ENCOUNTER — Other Ambulatory Visit: Payer: Self-pay

## 2017-01-29 ENCOUNTER — Encounter: Payer: Self-pay | Admitting: Gastroenterology

## 2017-01-29 ENCOUNTER — Telehealth: Payer: Self-pay

## 2017-01-29 ENCOUNTER — Ambulatory Visit (INDEPENDENT_AMBULATORY_CARE_PROVIDER_SITE_OTHER): Payer: 59 | Admitting: Gastroenterology

## 2017-01-29 DIAGNOSIS — R1310 Dysphagia, unspecified: Secondary | ICD-10-CM | POA: Diagnosis not present

## 2017-01-29 MED ORDER — PANTOPRAZOLE SODIUM 40 MG PO TBEC: 40.0000 mg | DELAYED_RELEASE_TABLET | Freq: Every day | ORAL | 3 refills | Status: DC

## 2017-01-29 NOTE — Patient Instructions (Signed)
We have scheduled you for an upper endoscopy with dilation with Dr. Gala Romney.  I have sent Protonix to your pharmacy to start taking once each morning, 30 minutes before breakfast.  Make sure to stick with soft foods for now, chew well, eat slowly.

## 2017-01-29 NOTE — Patient Instructions (Signed)
PA info for EGD/Dil submitted via [facility] website. Notification/prior authorization reference number is [id number].

## 2017-01-29 NOTE — Telephone Encounter (Signed)
Opened in error

## 2017-01-29 NOTE — Patient Instructions (Signed)
73    Your procedure is scheduled on: 02/02/2017  Report to [facility] at   12:30  PM.  Call this number if you have problems the morning of surgery: [phone]   Remember:   Do not drink or eat food:After Midnight.      Take these medicines the morning of surgery with A SIP OF WATER:    Do not wear jewelry, make-up or nail polish.  Do not wear lotions, powders, or perfumes. You may wear deodorant.    Do not bring valuables to the hospital. and neck.  Contacts, dentures or bridgework may not be worn into surgery.  Leave suitcase in the car. After surgery it may be brought to your room.  For patients admitted to the hospital, checkout time is 11:00 AM the day of discharge.   Patients discharged the day of surgery will not be allowed to drive home.  Name and phone number of your driver:    Please read over the following fact sheets that you were given: Pain Booklet, Lab Information and Anesthesia Post-op Instructions   Endoscopy Care After Please read the instructions outlined below and refer to this sheet in the next few weeks. These discharge instructions provide you with general information on caring for yourself after you leave the hospital. Your doctor may also give you specific instructions. While your treatment has been planned according to the most current medical practices available, unavoidable complications occasionally occur. If you have any problems or questions after discharge, please call your doctor. HOME CARE INSTRUCTIONS Activity  You may resume your regular activity but move at a slower pace for the next 24 hours.   Take frequent rest periods for the next 24 hours.   Walking will help expel (get rid of) the air and reduce the bloated feeling in your abdomen.   No driving for 24 hours (because of the anesthesia (medicine) used during the test).   You may shower.   Do not sign any important legal documents or operate any machinery for 24 hours (because of the  anesthesia used during the test).  Nutrition  Drink plenty of fluids.   You may resume your normal diet.   Begin with a light meal and progress to your normal diet.   Avoid alcoholic beverages for 24 hours or as instructed by your caregiver.  Medications You may resume your normal medications unless your caregiver tells you otherwise. What you can expect today  You may experience abdominal discomfort such as a feeling of fullness or "gas" pains.   You may experience a sore throat for 2 to 3 days. This is normal. Gargling with salt water may help this.  Follow-up Your doctor will discuss the results of your test with you. SEEK IMMEDIATE MEDICAL CARE IF:  You have excessive nausea (feeling sick to your stomach) and/or vomiting.   You have severe abdominal pain and distention (swelling).   You have trouble swallowing.   You have a temperature over 100 F (37.8 C).   You have rectal bleeding or vomiting of blood.  Document Released: 03/04/2004 Document Revised: 07/10/2011 Document Reviewed: 09/15/2007

## 2017-01-29 NOTE — Progress Notes (Signed)
Primary Care Physician:  [name], MD Primary Gastroenterologist:  Dr. Gala Romney   Chief Complaint  Patient presents with  . Dysphagia    HPI:   [name] is a pleasant 61 y.o. male presenting today at the request of Dr. Karie Kirks secondary to dysphagia. He has a history of hyperplastic polyps and due for routine surveillance, last in 2007. Notes 6 month history of solid food and liquid dysphagia, worsening. At times food will hang up and can't swallow saliva. Has to regurgitate. No odynophagia.Trying to stick with soft foods. Unable to pinpoint when it happens. No abdominal pain. Unsure if he has ever had an upper endoscopy. He doesn't believe he has ever had a dilatation. Denies history of GERD. No PPI.   Past Medical History:  Diagnosis Date  . COPD (chronic obstructive pulmonary disease) ([place])   . GERD (gastroesophageal reflux disease)   . Hypertension   . Malignant neoplasm of prostate ([place]) 01/19/2017  . Pancreatitis   . Pneumonia   . Pneumothorax   . Shortness of breath   . Sleep apnea    Uses sometimes    Past Surgical History:  Procedure Laterality Date  . CHEST TUBE INSERTION     spontaneous  . COLONOSCOPY  2007   Dr. Gala Romney: hyperplastic polyps, internal hemorrhoids   . THORACOTOMY      Current Outpatient Prescriptions  Medication Sig Dispense Refill  . cefUROXime (CEFTIN) 500 MG tablet Take 500 mg by mouth 2 (two) times daily with a meal.     . ibuprofen (ADVIL,MOTRIN) 200 MG tablet Take 400 mg by mouth daily as needed for mild pain.    Marland Kitchen ipratropium-albuterol (DUONEB) 0.5-2.5 (3) MG/3ML SOLN Take 3 mLs by nebulization every 6 (six) hours as needed for shortness of breath.    . pantoprazole (PROTONIX) 40 MG tablet Take 1 tablet (40 mg total) by mouth daily. 30 minutes before breakfast 90 tablet 3   No current facility-administered medications for this visit.     Allergies as of 01/29/2017  . (No Known Allergies)    Family History  Problem  Relation Age of Onset  . Cancer Neg Hx   . Colon cancer Neg Hx   . Colon polyps Neg Hx     Social History   Social History  . Marital status: Married    Spouse name: N/A  . Number of children: N/A  . Years of education: N/A   Occupational History  . Not on file.   Social History Main Topics  . Smoking status: Former Smoker    Packs/day: 1.00    Years: 30.00    Types: Cigarettes    Quit date: 11/03/2015  . Smokeless tobacco: Never Used     Comment: none since April 2017   . Alcohol use 0.0 oz/week     Comment: daily, wine 2-3 glasses.   . Drug use: No  . Sexual activity: Not Currently   Other Topics Concern  . Not on file   Social History Narrative  . No narrative on file    Review of Systems: Gen: Denies any fever, chills, fatigue, weight loss, lack of appetite.  CV: Denies chest pain, heart palpitations, peripheral edema, syncope.  Resp: +DOE GI: see HPI  GU : Denies urinary burning, urinary frequency, urinary hesitancy MS: +joint pain  Derm: Denies rash, itching, dry skin Psych: Denies depression, anxiety, memory loss, and confusion Heme: Denies bruising, bleeding, and enlarged lymph nodes.  Physical Exam: BP (!) 151/90  Pulse 92   Temp 97.4 F (36.3 C) (Oral)   Ht 6\' 2"  (1.88 m)   Wt 219 lb 9.6 oz (99.6 kg)   BMI 28.19 kg/m  General:   Alert and oriented. Pleasant and cooperative. Well-nourished and well-developed.  Head:  Normocephalic and atraumatic. Eyes:  Without icterus, sclera clear and conjunctiva pink.  Ears:  Normal auditory acuity. Nose:  No deformity, discharge,  or lesions. Mouth:  No deformity or lesions, oral mucosa pink.  Lungs:  Clear to auscultation bilaterally. No wheezes, rales, or rhonchi. No distress.  Heart:  S1, S2 present with possible systolic murmur  Abdomen:  +BS, soft, non-tender and non-distended. No HSM noted. No guarding or rebound. No masses appreciated. Small umbilical hernia Rectal:  Deferred  Msk:  Symmetrical  without gross deformities. Normal posture. Extremities:  Without  edema. Neurologic:  Alert and  oriented x4 Psych:  Alert and cooperative. Normal mood and affect.

## 2017-01-29 NOTE — Telephone Encounter (Signed)
Pt is being seen today 

## 2017-01-29 NOTE — Telephone Encounter (Signed)
REVIEWED-NO ADDITIONAL RECOMMENDATIONS. 

## 2017-01-30 ENCOUNTER — Encounter (HOSPITAL_COMMUNITY): Payer: Self-pay

## 2017-01-30 ENCOUNTER — Encounter (HOSPITAL_COMMUNITY)
Admission: RE | Admit: 2017-01-30 | Discharge: 2017-01-30 | Disposition: A | Discharge status: Home / Self Care | Payer: 59 | Source: Ambulatory Visit | Attending: Internal Medicine | Admitting: Internal Medicine

## 2017-01-30 DIAGNOSIS — K3189 Other diseases of stomach and duodenum: Secondary | ICD-10-CM | POA: Diagnosis not present

## 2017-01-30 DIAGNOSIS — I10 Essential (primary) hypertension: Secondary | ICD-10-CM | POA: Diagnosis not present

## 2017-01-30 DIAGNOSIS — J449 Chronic obstructive pulmonary disease, unspecified: Secondary | ICD-10-CM | POA: Diagnosis not present

## 2017-01-30 DIAGNOSIS — K219 Gastro-esophageal reflux disease without esophagitis: Secondary | ICD-10-CM | POA: Diagnosis not present

## 2017-01-30 DIAGNOSIS — K228 Other specified diseases of esophagus: Secondary | ICD-10-CM | POA: Diagnosis not present

## 2017-01-30 DIAGNOSIS — Z87891 Personal history of nicotine dependence: Secondary | ICD-10-CM | POA: Diagnosis not present

## 2017-01-30 DIAGNOSIS — K222 Esophageal obstruction: Secondary | ICD-10-CM | POA: Diagnosis not present

## 2017-01-30 DIAGNOSIS — Z79899 Other long term (current) drug therapy: Secondary | ICD-10-CM | POA: Diagnosis not present

## 2017-01-30 DIAGNOSIS — Z86010 Personal history of colonic polyps: Secondary | ICD-10-CM | POA: Diagnosis not present

## 2017-01-30 DIAGNOSIS — R1310 Dysphagia, unspecified: Secondary | ICD-10-CM | POA: Diagnosis present

## 2017-01-30 DIAGNOSIS — K449 Diaphragmatic hernia without obstruction or gangrene: Secondary | ICD-10-CM | POA: Diagnosis not present

## 2017-01-30 DIAGNOSIS — G4730 Sleep apnea, unspecified: Secondary | ICD-10-CM | POA: Diagnosis not present

## 2017-01-30 DIAGNOSIS — Z8546 Personal history of malignant neoplasm of prostate: Secondary | ICD-10-CM | POA: Diagnosis not present

## 2017-01-30 HISTORY — DX: Gastro-esophageal reflux disease without esophagitis: K21.9

## 2017-01-30 HISTORY — DX: Sleep apnea, unspecified: G47.30

## 2017-01-30 LAB — CBC
HEMATOCRIT: 43.3 % (ref 39.0–52.0)
Hemoglobin: 14.6 g/dL (ref 13.0–17.0)
MCH: 32.5 pg (ref 26.0–34.0)
MCHC: 33.7 g/dL (ref 30.0–36.0)
MCV: 96.4 fL (ref 78.0–100.0)
Platelets: 170 10*3/uL (ref 150–400)
RBC: 4.49 MIL/uL (ref 4.22–5.81)
RDW: 13.4 % (ref 11.5–15.5)
WBC: 8.6 10*3/uL (ref 4.0–10.5)

## 2017-01-30 LAB — BASIC METABOLIC PANEL
ANION GAP: 7 (ref 5–15)
BUN: 11 mg/dL (ref 6–20)
CHLORIDE: 104 mmol/L (ref 101–111)
CO2: 27 mmol/L (ref 22–32)
Calcium: 9.6 mg/dL (ref 8.9–10.3)
Creatinine, Ser: 1.04 mg/dL (ref 0.61–1.24)
GFR calc Af Amer: >60 mL/min (ref >60)
GLUCOSE: 101 mg/dL — AB (ref 65–99)
POTASSIUM: 4 mmol/L (ref 3.5–5.1)
Sodium: 138 mmol/L (ref 135–145)

## 2017-01-31 NOTE — Assessment & Plan Note (Signed)
61 year old male with new onset dysphagia over past several months, worsening now. Several episodes of significant choking and unable to swallow saliva, passing after several minutes after regurgitating food bolus. No odynophagia. No prior EGD/dilatation that he is aware. No PPI or typical GERD symptoms.   Proceed with upper endoscopy in the near future with Dr. Gala Romney. The risks, benefits, and alternatives have been discussed in detail with patient. They have stated understanding and desire to proceed.  PROPOFOL due to daily alcohol Start Protonix once daily As of note, he will need routine colonoscopy at some point this year once upper GI symptoms are addressed

## 2017-02-02 ENCOUNTER — Ambulatory Visit (HOSPITAL_COMMUNITY)
Admission: RE | Admit: 2017-02-02 | Discharge: 2017-02-02 | Disposition: A | Discharge status: Home / Self Care | Payer: 59 | Source: Ambulatory Visit | Attending: Internal Medicine | Admitting: Internal Medicine

## 2017-02-02 ENCOUNTER — Ambulatory Visit (HOSPITAL_COMMUNITY): Payer: 59 | Admitting: Anesthesiology

## 2017-02-02 ENCOUNTER — Encounter (HOSPITAL_COMMUNITY): Payer: Self-pay | Admitting: *Deleted

## 2017-02-02 ENCOUNTER — Ambulatory Visit: Payer: 59 | Admitting: Gastroenterology

## 2017-02-02 ENCOUNTER — Encounter (HOSPITAL_COMMUNITY)
Admission: RE | Disposition: A | Discharge status: Home / Self Care | Payer: Self-pay | Source: Ambulatory Visit | Attending: Internal Medicine

## 2017-02-02 DIAGNOSIS — R1310 Dysphagia, unspecified: Secondary | ICD-10-CM

## 2017-02-02 DIAGNOSIS — K228 Other specified diseases of esophagus: Secondary | ICD-10-CM | POA: Diagnosis not present

## 2017-02-02 DIAGNOSIS — K3189 Other diseases of stomach and duodenum: Secondary | ICD-10-CM | POA: Diagnosis not present

## 2017-02-02 DIAGNOSIS — K219 Gastro-esophageal reflux disease without esophagitis: Secondary | ICD-10-CM | POA: Insufficient documentation

## 2017-02-02 DIAGNOSIS — J449 Chronic obstructive pulmonary disease, unspecified: Secondary | ICD-10-CM | POA: Insufficient documentation

## 2017-02-02 DIAGNOSIS — K222 Esophageal obstruction: Secondary | ICD-10-CM | POA: Insufficient documentation

## 2017-02-02 DIAGNOSIS — I10 Essential (primary) hypertension: Secondary | ICD-10-CM | POA: Insufficient documentation

## 2017-02-02 DIAGNOSIS — K449 Diaphragmatic hernia without obstruction or gangrene: Secondary | ICD-10-CM | POA: Insufficient documentation

## 2017-02-02 DIAGNOSIS — Z87891 Personal history of nicotine dependence: Secondary | ICD-10-CM | POA: Insufficient documentation

## 2017-02-02 DIAGNOSIS — Z86010 Personal history of colonic polyps: Secondary | ICD-10-CM | POA: Insufficient documentation

## 2017-02-02 DIAGNOSIS — Z8546 Personal history of malignant neoplasm of prostate: Secondary | ICD-10-CM | POA: Insufficient documentation

## 2017-02-02 DIAGNOSIS — Z79899 Other long term (current) drug therapy: Secondary | ICD-10-CM | POA: Insufficient documentation

## 2017-02-02 DIAGNOSIS — G4730 Sleep apnea, unspecified: Secondary | ICD-10-CM | POA: Insufficient documentation

## 2017-02-02 HISTORY — PX: ESOPHAGOGASTRODUODENOSCOPY (EGD) WITH PROPOFOL: SHX5813

## 2017-02-02 HISTORY — PX: BIOPSY: SHX5522

## 2017-02-02 HISTORY — PX: MALONEY DILATION: SHX5535

## 2017-02-02 SURGERY — ESOPHAGOGASTRODUODENOSCOPY (EGD) WITH PROPOFOL
Anesthesia: Monitor Anesthesia Care

## 2017-02-02 MED ORDER — MIDAZOLAM HCL 2 MG/2ML IJ SOLN
INTRAMUSCULAR | Status: AC
  Filled 2017-02-02: qty 2

## 2017-02-02 MED ORDER — LIDOCAINE VISCOUS 2 % MT SOLN
15.0000 mL | Freq: Once | OROMUCOSAL | Status: AC
  Administered 2017-02-02: 3 mL via OROMUCOSAL

## 2017-02-02 MED ORDER — CHLORHEXIDINE GLUCONATE CLOTH 2 % EX PADS: 6.0000 | MEDICATED_PAD | Freq: Once | CUTANEOUS | Status: DC

## 2017-02-02 MED ORDER — STERILE WATER FOR IRRIGATION IR SOLN
Status: DC | PRN
  Administered 2017-02-02: 100 mL

## 2017-02-02 MED ORDER — LACTATED RINGERS IV SOLN
INTRAVENOUS | Status: DC
  Administered 2017-02-02: 12:00:00 via INTRAVENOUS

## 2017-02-02 MED ORDER — IPRATROPIUM-ALBUTEROL 0.5-2.5 (3) MG/3ML IN SOLN
RESPIRATORY_TRACT | Status: AC
  Filled 2017-02-02: qty 3

## 2017-02-02 MED ORDER — LIDOCAINE VISCOUS 2 % MT SOLN
OROMUCOSAL | Status: AC
  Filled 2017-02-02: qty 15

## 2017-02-02 MED ORDER — PROPOFOL 10 MG/ML IV BOLUS
INTRAVENOUS | Status: AC
  Filled 2017-02-02: qty 20

## 2017-02-02 MED ORDER — FENTANYL CITRATE (PF) 100 MCG/2ML IJ SOLN
25.0000 ug | Freq: Once | INTRAMUSCULAR | Status: AC
  Administered 2017-02-02: 25 ug via INTRAVENOUS

## 2017-02-02 MED ORDER — FENTANYL CITRATE (PF) 100 MCG/2ML IJ SOLN
INTRAMUSCULAR | Status: AC
  Filled 2017-02-02: qty 2

## 2017-02-02 MED ORDER — LIDOCAINE HCL (PF) 1 % IJ SOLN
INTRAMUSCULAR | Status: AC
  Filled 2017-02-02: qty 5

## 2017-02-02 MED ORDER — IPRATROPIUM-ALBUTEROL 0.5-2.5 (3) MG/3ML IN SOLN
3.0000 mL | Freq: Once | RESPIRATORY_TRACT | Status: AC
  Administered 2017-02-02: 3 mL via RESPIRATORY_TRACT

## 2017-02-02 MED ORDER — PROPOFOL 500 MG/50ML IV EMUL
INTRAVENOUS | Status: DC | PRN
  Administered 2017-02-02: 125 ug/kg/min via INTRAVENOUS
  Administered 2017-02-02: 50 ug/kg/min via INTRAVENOUS

## 2017-02-02 MED ORDER — PROPOFOL 10 MG/ML IV BOLUS
INTRAVENOUS | Status: DC | PRN
  Administered 2017-02-02 (×2): 20 mg via INTRAVENOUS

## 2017-02-02 MED ORDER — MIDAZOLAM HCL 2 MG/2ML IJ SOLN
1.0000 mg | INTRAMUSCULAR | Status: AC
  Administered 2017-02-02: 2 mg via INTRAVENOUS

## 2017-02-02 NOTE — Op Note (Signed)
[facility] Patient Name: [name] Procedure Date: 02/02/2017 12:14 PM MRN: [id number] Date of Birth: 1956/01/09 Attending MD: Norvel Richards , MD CSN: [id number] Age: 61 Admit Type: Outpatient Procedure:                Upper GI endoscopy Indications:              Dysphagia Providers:                Norvel Richards, MD, Jeanann Lewandowsky. Sharon Seller, RN,                            Rosina Lowenstein, RN, Randa Spike, Technician Referring MD:              Medicines:                Propofol per Anesthesia Complications:            No immediate complications. Estimated Blood Loss:     Estimated blood loss was minimal. Procedure:                Pre-Anesthesia Assessment:                           - Prior to the procedure, a History and Physical                            was performed, and patient medications and                            allergies were reviewed. The patient's tolerance of                            previous anesthesia was also reviewed. The risks                            and benefits of the procedure and the sedation                            options and risks were discussed with the patient.                            All questions were answered, and informed consent                            was obtained. Prior Anticoagulants: The patient has                            taken no previous anticoagulant or antiplatelet                            agents. ASA Grade Assessment: II - A patient with                            mild systemic disease. After reviewing the risks  and benefits, the patient was deemed in                            satisfactory condition to undergo the procedure.                           After obtaining informed consent, the endoscope was                            passed under direct vision. Throughout the                            procedure, the patient's blood pressure, pulse, and   oxygen saturations were monitored continuously. The                            [id number] ([id number]) scope was introduced through the                            and advanced to the second part of duodenum. The                            upper GI endoscopy was accomplished without                            difficulty. The patient tolerated the procedure                            well. Scope In: 12:29:18 PM Scope Out: 12:42:31 PM Total Procedure Duration: 0 hours 13 minutes 13 seconds  Findings:      Mucosal changes were found in the lower third of the esophagus. Some       linear crpe paper appearing mucosa. Narrowing through the GE junction.       No obvious tumor or infiltrating process.      A small hiatal hernia was present.      Diffuse mucosal changes characterized by scalloping were found in the       first portion of the duodenum. The scope was withdrawn. Dilation was       performed with a Maloney dilator with mild resistance at 29 Fr. The       dilation site was examined following endoscope reinsertion and showed       mild improvement in luminal narrowing. Estimated blood loss was minimal.       This was biopsied with a cold forceps for histology. Estimated blood       loss was minimal. Abnormal appearing duodenum was biopsied with a cold       forceps for histology. Estimated blood loss was minimal. Impression:               - Mucosal changes in the esophagus. Dilated. rule                            out EOE.Biopsied.                           - Small hiatal hernia.                           -  Mucosal changes in the duodenum. Biopsied. Moderate Sedation:      Moderate (conscious) sedation was personally administered by an       anesthesia professional. The following parameters were monitored: oxygen       saturation, heart rate, blood pressure, respiratory rate, EKG, adequacy       of pulmonary ventilation, and response to care. Total physician       intraservice time was  12 minutes. Recommendation:           - Patient has a contact number available for                            emergencies. The signs and symptoms of potential                            delayed complications were discussed with the                            patient. Return to normal activities tomorrow.                            Written discharge instructions were provided to the                            patient.                           - Resume previous diet.                           - Continue present medications.                           - Await pathology results.                           - No repeat upper endoscopy.                           - Return to GI office in 8 weeks. Continue Protonix                            40 mg daily. Will set up a screening colonoscopy                            when he returns to the office later this year. Procedure Code(s):        --- Professional ---                           [phone], Esophagogastroduodenoscopy, flexible,                            transoral; with biopsy, single or multiple                           43450, Dilation of esophagus, by unguided sound or  bougie, single or multiple passes Diagnosis Code(s):        --- Professional ---                           K22.8, Other specified diseases of esophagus                           K31.89, Other diseases of stomach and duodenum                           K44.9, Diaphragmatic hernia without obstruction or                            gangrene                           R13.10, Dysphagia, unspecified CPT copyright 2016 American Medical Association. All rights reserved. The codes documented in this report are preliminary and upon coder review may  be revised to meet current compliance requirements. [name]. Keylen Uzelac, MD Norvel Richards, MD 02/02/2017 12:54:11 PM This report has been signed electronically. Number of Addenda: 0

## 2017-02-02 NOTE — Anesthesia Procedure Notes (Signed)
Procedure Name: MAC Date/Time: 02/02/2017 12:19 PM Performed by: Vista Deck Pre-anesthesia Checklist: Patient identified, Emergency Drugs available, Suction available, Timeout performed and Patient being monitored Patient Re-evaluated:Patient Re-evaluated prior to inductionOxygen Delivery Method: Non-rebreather mask

## 2017-02-02 NOTE — Anesthesia Preprocedure Evaluation (Signed)
Anesthesia Evaluation  Patient identified by MRN, date of birth, ID band Patient awake    Reviewed: Allergy & Precautions, NPO status , Patient's Chart, lab work & pertinent test results  Airway Mallampati: I  TM Distance: >3 FB Neck ROM: Full    Dental  (+) Teeth Intact   Pulmonary shortness of breath and with exertion, sleep apnea and Continuous Positive Airway Pressure Ventilation , COPD, former smoker,    breath sounds clear to auscultation       Cardiovascular hypertension, Pt. on medications  Rhythm:Regular Rate:Normal     Neuro/Psych    GI/Hepatic GERD  ,(+)     substance abuse  alcohol use,   Endo/Other    Renal/GU      Musculoskeletal   Abdominal   Peds  Hematology   Anesthesia Other Findings   Reproductive/Obstetrics                             Anesthesia Physical Anesthesia Plan  ASA: III  Anesthesia Plan: MAC   Post-op Pain Management:    Induction: Intravenous  PONV Risk Score and Plan:   Airway Management Planned: Simple Face Mask  Additional Equipment:   Intra-op Plan:   Post-operative Plan:   Informed Consent: I have reviewed the patients History and Physical, chart, labs and discussed the procedure including the risks, benefits and alternatives for the proposed anesthesia with the patient or authorized representative who has indicated his/her understanding and acceptance.     Plan Discussed with:   Anesthesia Plan Comments:         Anesthesia Quick Evaluation

## 2017-02-02 NOTE — Discharge Instructions (Addendum)
EGD Discharge instructions Please read the instructions outlined below and refer to this sheet in the next few weeks. These discharge instructions provide you with general information on caring for yourself after you leave the hospital. Your doctor may also give you specific instructions. While your treatment has been planned according to the most current medical practices available, unavoidable complications occasionally occur. If you have any problems or questions after discharge, please call your doctor. ACTIVITY  You may resume your regular activity but move at a slower pace for the next 24 hours.   Take frequent rest periods for the next 24 hours.   Walking will help expel (get rid of) the air and reduce the bloated feeling in your abdomen.   No driving for 24 hours (because of the anesthesia (medicine) used during the test).   You may shower.   Do not sign any important legal documents or operate any machinery for 24 hours (because of the anesthesia used during the test).  NUTRITION  Drink plenty of fluids.   You may resume your normal diet.   Begin with a light meal and progress to your normal diet.   Avoid alcoholic beverages for 24 hours or as instructed by your caregiver.  MEDICATIONS  You may resume your normal medications unless your caregiver tells you otherwise.  WHAT YOU CAN EXPECT TODAY  You may experience abdominal discomfort such as a feeling of fullness or gas pains.  FOLLOW-UP  Your doctor will discuss the results of your test with you.  SEEK IMMEDIATE MEDICAL ATTENTION IF ANY OF THE FOLLOWING OCCUR:  Excessive nausea (feeling sick to your stomach) and/or vomiting.   Severe abdominal pain and distention (swelling).   Trouble swallowing.   Temperature over 101 F (37.8 C).   Rectal bleeding or vomiting of blood.      GERD information provided    Continue Protonix 40 mg daily    Further recommendations to follow pending review of  pathology report   Office visit with [place] in 2 months     Gastroesophageal Reflux Disease, Adult Normally, food travels down the esophagus and stays in the stomach to be digested. If a person has gastroesophageal reflux disease (GERD), food and stomach acid move back up into the esophagus. When this happens, the esophagus becomes sore and swollen (inflamed). Over time, GERD can make small holes (ulcers) in the lining of the esophagus. Follow these instructions at home: Diet  Follow a diet as told by your doctor. You may need to avoid foods and drinks such as: ? Coffee and tea (with or without caffeine). ? Drinks that contain alcohol. ? Energy drinks and sports drinks. ? Carbonated drinks or sodas. ? Chocolate and cocoa. ? Peppermint and mint flavorings. ? Garlic and onions. ? Horseradish. ? Spicy and acidic foods, such as peppers, chili powder, curry powder, vinegar, hot sauces, and BBQ sauce. ? Citrus fruit juices and citrus fruits, such as oranges, lemons, and limes. ? Tomato-based foods, such as red sauce, chili, salsa, and pizza with red sauce. ? Fried and fatty foods, such as donuts, french fries, potato chips, and high-fat dressings. ? High-fat meats, such as hot dogs, rib eye steak, sausage, ham, and bacon. ? High-fat dairy items, such as whole milk, butter, and cream cheese.  Eat small meals often. Avoid eating large meals.  Avoid drinking large amounts of liquid with your meals.  Avoid eating meals during the 2-3 hours before bedtime.  Avoid lying down right after you eat.  Do not exercise right after you eat. General instructions  Pay attention to any changes in your symptoms.  Take over-the-counter and prescription medicines only as told by your doctor. Do not take aspirin, ibuprofen, or other NSAIDs unless your doctor says it is okay.  Do not use any tobacco products, including cigarettes, chewing tobacco, and e-cigarettes. If you need help quitting, ask your  doctor.  Wear loose clothes. Do not wear anything tight around your waist.  Raise (elevate) the head of your bed about 6 inches (15 cm).  Try to lower your stress. If you need help doing this, ask your doctor.  If you are overweight, lose an amount of weight that is healthy for you. Ask your doctor about a safe weight loss goal.  Keep all follow-up visits as told by your doctor. This is important. Contact a doctor if:  You have new symptoms.  You lose weight and you do not know why it is happening.  You have trouble swallowing, or it hurts to swallow.  You have wheezing or a cough that keeps happening.  Your symptoms do not get better with treatment.  You have a hoarse voice. Get help right away if:  You have pain in your arms, neck, jaw, teeth, or back.  You feel sweaty, dizzy, or light-headed.  You have chest pain or shortness of breath.  You throw up (vomit) and your throw up looks like blood or coffee grounds.  You pass out (faint).  Your poop (stool) is bloody or black.  You cannot swallow, drink, or eat. This information is not intended to replace advice given to you by your health care provider. Make sure you discuss any questions you have with your health care provider. Document Released: 01/07/2008 Document Revised: 12/27/2015 Document Reviewed: 11/15/2014 Elsevier Interactive Patient Education  [employer].

## 2017-02-02 NOTE — Anesthesia Postprocedure Evaluation (Signed)
Anesthesia Post Note  Patient: [name]  Procedure(s) Performed: Procedure(s) (LRB): ESOPHAGOGASTRODUODENOSCOPY (EGD) WITH PROPOFOL (N/A) MALONEY DILATION (N/A) BIOPSY  Patient location during evaluation: PACU Anesthesia Type: MAC Level of consciousness: awake and alert Pain management: satisfactory to patient Vital Signs Assessment: post-procedure vital signs reviewed and stable Respiratory status: spontaneous breathing Cardiovascular status: stable Postop Assessment: no signs of nausea or vomiting Anesthetic complications: no     Last Vitals:  Vitals:   02/02/17 1315 02/02/17 1328  BP: 118/84 111/76  Pulse: 63 72  Resp: 14 16  Temp:  36.7 C    Last Pain:  Vitals:   02/02/17 1328  TempSrc: Oral                 [name]

## 2017-02-02 NOTE — Transfer of Care (Signed)
Immediate Anesthesia Transfer of Care Note  Patient: [name]  Procedure(s) Performed: Procedure(s) with comments: ESOPHAGOGASTRODUODENOSCOPY (EGD) WITH PROPOFOL (N/A) - 2:00pm MALONEY DILATION (N/A) BIOPSY - duodenal bx's, esophgeal bx's  Patient Location: PACU  Anesthesia Type:MAC  Level of Consciousness: awake and alert   Airway & Oxygen Therapy: Patient Spontanous Breathing and non-rebreather face mask  Post-op Assessment: Report given to RN and Post -op Vital signs reviewed and stable  Post vital signs: Reviewed and stable  Last Vitals:  Vitals:   02/02/17 1114  BP: 111/84  Pulse: 77  Resp: (!) 23  Temp: 36.7 C    Last Pain:  Vitals:   02/02/17 1114  TempSrc: Oral         Complications: No apparent anesthesia complications

## 2017-02-02 NOTE — Progress Notes (Signed)
CC'D TO PCP °

## 2017-02-02 NOTE — H&P (View-Only) (Signed)
Primary Care Physician:  [name], MD Primary Gastroenterologist:  Dr. Gala Romney   Chief Complaint  Patient presents with  . Dysphagia    HPI:   [name] is a pleasant 61 y.o. male presenting today at the request of Dr. Karie Kirks secondary to dysphagia. He has a history of hyperplastic polyps and due for routine surveillance, last in 2007. Notes 6 month history of solid food and liquid dysphagia, worsening. At times food will hang up and can't swallow saliva. Has to regurgitate. No odynophagia.Trying to stick with soft foods. Unable to pinpoint when it happens. No abdominal pain. Unsure if he has ever had an upper endoscopy. He doesn't believe he has ever had a dilatation. Denies history of GERD. No PPI.   Past Medical History:  Diagnosis Date  . COPD (chronic obstructive pulmonary disease) ([place])   . GERD (gastroesophageal reflux disease)   . Hypertension   . Malignant neoplasm of prostate ([place]) 01/19/2017  . Pancreatitis   . Pneumonia   . Pneumothorax   . Shortness of breath   . Sleep apnea    Uses sometimes    Past Surgical History:  Procedure Laterality Date  . CHEST TUBE INSERTION     spontaneous  . COLONOSCOPY  2007   Dr. Gala Romney: hyperplastic polyps, internal hemorrhoids   . THORACOTOMY      Current Outpatient Prescriptions  Medication Sig Dispense Refill  . cefUROXime (CEFTIN) 500 MG tablet Take 500 mg by mouth 2 (two) times daily with a meal.     . ibuprofen (ADVIL,MOTRIN) 200 MG tablet Take 400 mg by mouth daily as needed for mild pain.    Marland Kitchen ipratropium-albuterol (DUONEB) 0.5-2.5 (3) MG/3ML SOLN Take 3 mLs by nebulization every 6 (six) hours as needed for shortness of breath.    . pantoprazole (PROTONIX) 40 MG tablet Take 1 tablet (40 mg total) by mouth daily. 30 minutes before breakfast 90 tablet 3   No current facility-administered medications for this visit.     Allergies as of 01/29/2017  . (No Known Allergies)    Family History  Problem  Relation Age of Onset  . Cancer Neg Hx   . Colon cancer Neg Hx   . Colon polyps Neg Hx     Social History   Social History  . Marital status: Married    Spouse name: N/A  . Number of children: N/A  . Years of education: N/A   Occupational History  . Not on file.   Social History Main Topics  . Smoking status: Former Smoker    Packs/day: 1.00    Years: 30.00    Types: Cigarettes    Quit date: 11/03/2015  . Smokeless tobacco: Never Used     Comment: none since April 2017   . Alcohol use 0.0 oz/week     Comment: daily, wine 2-3 glasses.   . Drug use: No  . Sexual activity: Not Currently   Other Topics Concern  . Not on file   Social History Narrative  . No narrative on file    Review of Systems: Gen: Denies any fever, chills, fatigue, weight loss, lack of appetite.  CV: Denies chest pain, heart palpitations, peripheral edema, syncope.  Resp: +DOE GI: see HPI  GU : Denies urinary burning, urinary frequency, urinary hesitancy MS: +joint pain  Derm: Denies rash, itching, dry skin Psych: Denies depression, anxiety, memory loss, and confusion Heme: Denies bruising, bleeding, and enlarged lymph nodes.  Physical Exam: BP (!) 151/90  Pulse 92   Temp 97.4 F (36.3 C) (Oral)   Ht 6\' 2"  (1.88 m)   Wt 219 lb 9.6 oz (99.6 kg)   BMI 28.19 kg/m  General:   Alert and oriented. Pleasant and cooperative. Well-nourished and well-developed.  Head:  Normocephalic and atraumatic. Eyes:  Without icterus, sclera clear and conjunctiva pink.  Ears:  Normal auditory acuity. Nose:  No deformity, discharge,  or lesions. Mouth:  No deformity or lesions, oral mucosa pink.  Lungs:  Clear to auscultation bilaterally. No wheezes, rales, or rhonchi. No distress.  Heart:  S1, S2 present with possible systolic murmur  Abdomen:  +BS, soft, non-tender and non-distended. No HSM noted. No guarding or rebound. No masses appreciated. Small umbilical hernia Rectal:  Deferred  Msk:  Symmetrical  without gross deformities. Normal posture. Extremities:  Without  edema. Neurologic:  Alert and  oriented x4 Psych:  Alert and cooperative. Normal mood and affect.

## 2017-02-02 NOTE — Interval H&P Note (Signed)
History and Physical Interval Note:  02/02/2017 12:09 PM  [name]  has presented today for surgery, with the diagnosis of dysphagia  The various methods of treatment have been discussed with the patient and family. After consideration of risks, benefits and other options for treatment, the patient has consented to  Procedure(s) with comments: ESOPHAGOGASTRODUODENOSCOPY (EGD) WITH PROPOFOL (N/A) - 2:00pm MALONEY DILATION (N/A) as a surgical intervention .  The patient's history has been reviewed, patient examined, no change in status, stable for surgery.  I have reviewed the patient's chart and labs.  Questions were answered to the patient's satisfaction.     Mahsa Hanser  No change. EGD/ED as feasible/appropriate per plan.  The risks, benefits, limitations, alternatives and imponderables have been reviewed with the patient. Potential for esophageal dilation, biopsy, etc. have also been reviewed.  Questions have been answered. All parties agreeable.

## 2017-02-06 ENCOUNTER — Encounter (HOSPITAL_COMMUNITY): Payer: Self-pay | Admitting: Internal Medicine

## 2017-02-06 ENCOUNTER — Encounter: Payer: Self-pay | Admitting: Internal Medicine

## 2017-02-19 ENCOUNTER — Telehealth: Payer: Self-pay | Admitting: *Deleted

## 2017-02-19 NOTE — Telephone Encounter (Signed)
CALLED PATIENT TO REMIND OF PRE-SEED APPT. FOR 02-20-17, SPOKE WITH PATIENT AND HE IS AWARE OF THIS APPT.

## 2017-02-20 ENCOUNTER — Encounter: Payer: Self-pay | Admitting: Medical Oncology

## 2017-02-20 ENCOUNTER — Ambulatory Visit
Admission: RE | Admit: 2017-02-20 | Discharge: 2017-02-20 | Disposition: A | Discharge status: Home / Self Care | Payer: 59 | Source: Ambulatory Visit | Attending: Radiation Oncology | Admitting: Radiation Oncology

## 2017-02-20 ENCOUNTER — Ambulatory Visit (HOSPITAL_COMMUNITY)
Admission: RE | Admit: 2017-02-20 | Discharge: 2017-02-20 | Disposition: A | Discharge status: Home / Self Care | Payer: 59 | Source: Ambulatory Visit | Attending: Urology | Admitting: Urology

## 2017-02-20 DIAGNOSIS — C61 Malignant neoplasm of prostate: Secondary | ICD-10-CM

## 2017-02-20 DIAGNOSIS — J449 Chronic obstructive pulmonary disease, unspecified: Secondary | ICD-10-CM | POA: Insufficient documentation

## 2017-02-20 DIAGNOSIS — Z01818 Encounter for other preprocedural examination: Secondary | ICD-10-CM | POA: Insufficient documentation

## 2017-02-20 NOTE — Progress Notes (Signed)
[name] scheduled for seed implant 04/23/17.

## 2017-02-20 NOTE — Progress Notes (Signed)
  Radiation Oncology         [phone]) [phone] ________________________________  Name: [name] MRN: [id number]  Date: 02/20/2017  DOB: 1955-10-08  SIMULATION AND TREATMENT PLANNING NOTE PUBIC ARCH STUDY  [id number], Richardson Landry, MD  Franchot Gallo, MD  DIAGNOSIS: 61 y.o. gentleman with Stage T2b adenocarcinoma of the prostate with Gleason Score of 3+4, and PSA of 5.99     ICD-10-CM   1. Malignant neoplasm of prostate ([place]) C61     COMPLEX SIMULATION:  The patient presented today for evaluation for possible prostate seed implant. He was brought to the radiation planning suite and placed supine on the CT couch. A 3-dimensional image study set was obtained in upload to the planning computer. There, on each axial slice, I contoured the prostate gland. Then, using three-dimensional radiation planning tools I reconstructed the prostate in view of the structures from the transperineal needle pathway to assess for possible pubic arch interference. In doing so, I did not appreciate any pubic arch interference. Also, the patient's prostate volume was estimated based on the drawn structure. The volume was 25 cc.  Given the pubic arch appearance and prostate volume, patient remains a good candidate to proceed with prostate seed implant. Today, he freely provided informed written consent to proceed.    PLAN: The patient will undergo prostate seed implant.   ________________________________  Sheral Apley. Tammi Klippel, M.D.

## 2017-04-07 ENCOUNTER — Ambulatory Visit (INDEPENDENT_AMBULATORY_CARE_PROVIDER_SITE_OTHER): Payer: 59 | Admitting: Gastroenterology

## 2017-04-07 ENCOUNTER — Encounter: Payer: Self-pay | Admitting: Gastroenterology

## 2017-04-07 VITALS — BP 137/83 | HR 87 | Temp 98.0°F | Ht 74.0 in | Wt 222.0 lb

## 2017-04-07 DIAGNOSIS — K2100 Gastro-esophageal reflux disease with esophagitis, without bleeding: Secondary | ICD-10-CM

## 2017-04-07 DIAGNOSIS — R1310 Dysphagia, unspecified: Secondary | ICD-10-CM

## 2017-04-07 DIAGNOSIS — R1319 Other dysphagia: Secondary | ICD-10-CM

## 2017-04-07 DIAGNOSIS — K219 Gastro-esophageal reflux disease without esophagitis: Secondary | ICD-10-CM | POA: Insufficient documentation

## 2017-04-07 NOTE — Progress Notes (Signed)
      Primary Care Physician: [name], MD  Primary Gastroenterologist:  Garfield Cornea, MD   Chief Complaint  Patient presents with  . Dysphagia    HPI: [name] is a 61 y.o. male hereFor follow-up of esophageal dysphagia. Patient had an EGD on July 2 showing mucosal changes in the lower third of the esophagus, some linear crpe paper appearing mucosa, narrowing of the GE junction. Small hiatal hernia. Diffuse mucosal changes found in the first portion of the duodenum. Patient's esophagus was dilated with 77 [place]. Duodenal biopsies were unremarkable. Esophageal biopsy showed no evidence of significant inflammation dysplasia or malignancy.  Patient continues to have some episodes of dysphagia to solid foods as well as liquid. He has been careful chewing food thoroughly, taken this time eating and drinking. He feels like he may need his esophagus stretched again but right now he needs to postpone because he is currently undergoing treatment for prostate cancer. He is also due for screening colonoscopy would like to postpone until after his prostate treatment. He is having radioactive seed implants placed on September 20. May need to postpone colonoscopy for several months until his radiation treatment is completed.   Denies abdominal pain. Bowel function regular. No melena rectal bleeding. No heartburn. He is continues pantoprazole 40 mg daily.  Current Outpatient Prescriptions  Medication Sig Dispense Refill  . ibuprofen (ADVIL,MOTRIN) 200 MG tablet Take 400 mg by mouth daily as needed for mild pain.    Marland Kitchen ipratropium-albuterol (DUONEB) 0.5-2.5 (3) MG/3ML SOLN Take 3 mLs by nebulization every 6 (six) hours as needed for shortness of breath.    . pantoprazole (PROTONIX) 40 MG tablet Take 1 tablet (40 mg total) by mouth daily. 30 minutes before breakfast 90 tablet 3   No current facility-administered medications for this visit.     Allergies as of 04/07/2017  . (No Known  Allergies)    ROS:  General: Negative for anorexia, weight loss, fever, chills, fatigue, weakness. ENT: Negative for hoarseness, difficulty swallowing , nasal congestion. CV: Negative for chest pain, angina, palpitations, dyspnea on exertion, peripheral edema.  Respiratory: Negative for dyspnea at rest, dyspnea on exertion, cough, sputum, wheezing.  GI: See history of present illness. GU:  Negative for dysuria, hematuria, urinary incontinence, urinary frequency, nocturnal urination.  Endo: Negative for unusual weight change.    Physical Examination:   BP 137/83   Pulse 87   Temp 98 F (36.7 C) (Oral)   Ht 6\' 2"  (1.88 m)   Wt 222 lb (100.7 kg)   BMI 28.50 kg/m   General: Well-nourished, well-developed in no acute distress.  Eyes: No icterus. Mouth: Oropharyngeal mucosa moist and pink , no lesions erythema or exudate. Lungs: Clear to auscultation bilaterally.  Heart: Regular rate and rhythm, no murmurs rubs or gallops.  Abdomen: Bowel sounds are normal, nontender, nondistended, no hepatosplenomegaly or masses, no abdominal bruits or hernia , no rebound or guarding.   Extremities: No lower extremity edema. No clubbing or deformities. Neuro: Alert and oriented x 4   Skin: Warm and dry, no jaundice.   Psych: Alert and cooperative, normal mood and affect.

## 2017-04-07 NOTE — Patient Instructions (Signed)
1. We will plan to have you come back in a couple of months to schedule upper endoscopy and colonoscopy IF you are ready at that time.  2. In the meantime, be sure to take your time when eating/drinking. Chew food thoroughly. Avoid raw vegetables and tough meat. Use plenty of liquid when eating or taking medications.

## 2017-04-07 NOTE — Assessment & Plan Note (Signed)
Continues with intermittent dysphagia to solid foods and liquids. Esophageal dilation in July as outlined. Likely needs further esophageal dilation. Right now he wants to concentrate on his prostate cancer treatments. Swallowing per cautions discussed. We'll plan to have him come back in a couple months when he is ready for EGD with dilation. He is still needing routine colonoscopy at some point however agree with patient in postponing until after his radiation treatment is completed. He can discuss this further with Dr. Tammi Klippel, radiation oncologist to confirm.

## 2017-04-07 NOTE — Progress Notes (Signed)
CC'ED TO PCP 

## 2017-04-15 ENCOUNTER — Telehealth: Payer: Self-pay | Admitting: *Deleted

## 2017-04-15 NOTE — Telephone Encounter (Signed)
CALLED  PATIENT TO REMIND OF LABS FOR PROCEDURE ON 04/23/17, LVM FOR A RETURN CALL

## 2017-04-16 ENCOUNTER — Telehealth: Payer: Self-pay | Admitting: *Deleted

## 2017-04-16 ENCOUNTER — Encounter (HOSPITAL_BASED_OUTPATIENT_CLINIC_OR_DEPARTMENT_OTHER): Payer: Self-pay | Admitting: *Deleted

## 2017-04-16 NOTE — Progress Notes (Signed)
NPO AFTER MN W/ EXCEPTION CLEAR LIQUIDS UNTIL 0630 (NO CREAM / MILK PRODUCTS).  ARRIVE AT 1100.  GETTING LAB WORK DONE Monday 04-20-2017 (CBC, CMET, PT/INR, PTT).  CURRENT EKG AND CXR IN CHART AND EPIC.  WILL TAKE PROTONIX AND DO DUONEB AM DOS W/ SIPS OF WATER (PT STATED WILL DO DUONEB DAILY STARTING 2 DAYS PRIOR TO SURGERY ALSO).  WILL DO FLEET ANEMA AM DOS.

## 2017-04-16 NOTE — Telephone Encounter (Signed)
Called patient to remind of labs on 04-20-17 for implant, spoke with patient and he is aware of this lab

## 2017-04-20 DIAGNOSIS — G709 Myoneural disorder, unspecified: Secondary | ICD-10-CM | POA: Diagnosis not present

## 2017-04-20 DIAGNOSIS — K449 Diaphragmatic hernia without obstruction or gangrene: Secondary | ICD-10-CM | POA: Diagnosis not present

## 2017-04-20 DIAGNOSIS — G4733 Obstructive sleep apnea (adult) (pediatric): Secondary | ICD-10-CM | POA: Diagnosis not present

## 2017-04-20 DIAGNOSIS — Z87891 Personal history of nicotine dependence: Secondary | ICD-10-CM | POA: Diagnosis not present

## 2017-04-20 DIAGNOSIS — Z86010 Personal history of colonic polyps: Secondary | ICD-10-CM | POA: Diagnosis not present

## 2017-04-20 DIAGNOSIS — J449 Chronic obstructive pulmonary disease, unspecified: Secondary | ICD-10-CM | POA: Diagnosis not present

## 2017-04-20 DIAGNOSIS — C61 Malignant neoplasm of prostate: Secondary | ICD-10-CM | POA: Diagnosis present

## 2017-04-20 DIAGNOSIS — Z9889 Other specified postprocedural states: Secondary | ICD-10-CM | POA: Diagnosis not present

## 2017-04-20 DIAGNOSIS — M1990 Unspecified osteoarthritis, unspecified site: Secondary | ICD-10-CM | POA: Diagnosis not present

## 2017-04-20 DIAGNOSIS — K219 Gastro-esophageal reflux disease without esophagitis: Secondary | ICD-10-CM | POA: Diagnosis not present

## 2017-04-20 DIAGNOSIS — I10 Essential (primary) hypertension: Secondary | ICD-10-CM | POA: Diagnosis not present

## 2017-04-20 DIAGNOSIS — Z8719 Personal history of other diseases of the digestive system: Secondary | ICD-10-CM | POA: Diagnosis not present

## 2017-04-20 DIAGNOSIS — G4730 Sleep apnea, unspecified: Secondary | ICD-10-CM | POA: Diagnosis not present

## 2017-04-20 DIAGNOSIS — M5136 Other intervertebral disc degeneration, lumbar region: Secondary | ICD-10-CM | POA: Diagnosis not present

## 2017-04-20 DIAGNOSIS — Z8711 Personal history of peptic ulcer disease: Secondary | ICD-10-CM | POA: Diagnosis not present

## 2017-04-20 LAB — CBC
HCT: 44.6 % (ref 39.0–52.0)
Hemoglobin: 15.8 g/dL (ref 13.0–17.0)
MCH: 32.8 pg (ref 26.0–34.0)
MCHC: 35.4 g/dL (ref 30.0–36.0)
MCV: 92.7 fL (ref 78.0–100.0)
PLATELETS: 167 10*3/uL (ref 150–400)
RBC: 4.81 MIL/uL (ref 4.22–5.81)
RDW: 12.3 % (ref 11.5–15.5)
WBC: 7.9 10*3/uL (ref 4.0–10.5)

## 2017-04-20 LAB — COMPREHENSIVE METABOLIC PANEL
ALT: 41 U/L (ref 17–63)
AST: 45 U/L — AB (ref 15–41)
Albumin: 3.9 g/dL (ref 3.5–5.0)
Alkaline Phosphatase: 59 U/L (ref 38–126)
Anion gap: 13 (ref 5–15)
BUN: 8 mg/dL (ref 6–20)
CHLORIDE: 99 mmol/L — AB (ref 101–111)
CO2: 25 mmol/L (ref 22–32)
CREATININE: 1 mg/dL (ref 0.61–1.24)
Calcium: 9.5 mg/dL (ref 8.9–10.3)
GFR calc Af Amer: >60 mL/min (ref >60)
GFR calc non Af Amer: >60 mL/min (ref >60)
Glucose, Bld: 111 mg/dL — ABNORMAL HIGH (ref 65–99)
POTASSIUM: 4 mmol/L (ref 3.5–5.1)
SODIUM: 137 mmol/L (ref 135–145)
TOTAL PROTEIN: 7.7 g/dL (ref 6.5–8.1)
Total Bilirubin: 1.1 mg/dL (ref 0.3–1.2)

## 2017-04-20 LAB — PROTIME-INR
INR: 0.97
PROTHROMBIN TIME: 12.8 s (ref 11.4–15.2)

## 2017-04-20 LAB — APTT: aPTT: 26 seconds (ref 24–36)

## 2017-04-21 ENCOUNTER — Telehealth: Payer: Self-pay | Admitting: *Deleted

## 2017-04-21 ENCOUNTER — Ambulatory Visit
Admission: RE | Admit: 2017-04-21 | Discharge: 2017-04-21 | Disposition: A | Discharge status: Home / Self Care | Payer: 59 | Source: Ambulatory Visit | Attending: Neurology | Admitting: Neurology

## 2017-04-21 ENCOUNTER — Encounter: Payer: Self-pay | Admitting: Neurology

## 2017-04-21 ENCOUNTER — Ambulatory Visit (INDEPENDENT_AMBULATORY_CARE_PROVIDER_SITE_OTHER): Payer: 59 | Admitting: Neurology

## 2017-04-21 VITALS — BP 125/75 | HR 91 | Ht 74.0 in | Wt 225.0 lb

## 2017-04-21 DIAGNOSIS — R209 Unspecified disturbances of skin sensation: Secondary | ICD-10-CM | POA: Diagnosis not present

## 2017-04-21 DIAGNOSIS — M7989 Other specified soft tissue disorders: Secondary | ICD-10-CM

## 2017-04-21 DIAGNOSIS — R208 Other disturbances of skin sensation: Secondary | ICD-10-CM | POA: Diagnosis not present

## 2017-04-21 DIAGNOSIS — R200 Anesthesia of skin: Secondary | ICD-10-CM

## 2017-04-21 DIAGNOSIS — IMO0001 Reserved for inherently not codable concepts without codable children: Secondary | ICD-10-CM

## 2017-04-21 NOTE — Patient Instructions (Signed)
XRAY of the left foot then recommend MRI

## 2017-04-21 NOTE — Telephone Encounter (Signed)
-----   Message from Melvenia Beam, MD sent at 04/21/2017 11:10 AM EDT ----- Odette Horns is normal, ask him to follow up with his pcp thanks

## 2017-04-21 NOTE — Telephone Encounter (Signed)
Called and spoke with patient about results per AA,MD note. He verbalized understanding.

## 2017-04-21 NOTE — Progress Notes (Signed)
[name]    Provider:  Dr Jaynee Eagles Referring Provider: [name], MD Primary Care Physician:  [name], MD  CC:  Left leg numbness  HPI:  [name] is a 61 y.o. male here as a referral from Dr. Karie Kirks for ankle pain. Past medical history of continued ankle pain, localized edema, overweight, venous insufficiency chronic peripheral made by vascular specialist, history of nicotine dependence, COPD. The left ankle stays numb, started about a year ago. He has PVD and discoloration of the skin. He has been wearing compression stocking and sometimes it gets very tight with swelling. He has significant back pain, a ruptured disk on L5/S1 and it shoots down the left leg to the foot. He stretches religiously. He has no weakness of the left leg, the right leg is unaffected. The swelling started about a year ago. The back pain has been chronic. No injuries to the foot, slowly progressive numbness and swelling. There is som epain on movement on inversion and occ burning.  Reviewed notes, labs and imaging from outside physicians, which showed:  Reviewed primary care notes, patient was seen for left ankle numbness and swelling. He has apparently been to a vascular specialist, wearing compression socks which helps the swelling and discoloration. A prednisone taper did not help. He continues to have numbness in the left third and fifth toes on the outer portion of his left ankle. He remains active walking, playing golf and swimming. There is a brawny discoloration to the distal half of the lower leg, swelling of the left foot, decreased sensation to monofilament of the portion of the left foot, intact sensation to the left first and second toes.  Reviewed report of left lower extremity ultrasound venous which showed no evidence of deep venous thrombosis.  Review of Systems: Patient complains of symptoms per HPI as well as the following symptoms: Swelling in legs, snoring,  trouble swallowing, cramps, numbness, snoring, difficulty swallowing, prostate cancer. Pertinent negatives and positives per HPI. All others negative.   Social History   Social History  . Marital status: Married    Spouse name: N/A  . Number of children: N/A  . Years of education: N/A   Occupational History  . Not on file.   Social History Main Topics  . Smoking status: Former Smoker    Packs/day: 1.00    Years: 30.00    Types: Cigarettes    Quit date: 11/03/2015  . Smokeless tobacco: Never Used  . Alcohol use 12.6 oz/week    21 Shots of liquor per week     Comment: 3 drinks daily  . Drug use: No  . Sexual activity: Not on file   Other Topics Concern  . Not on file   Social History Narrative  . No narrative on file    Family History  Problem Relation Age of Onset  . Cancer Neg Hx   . Colon cancer Neg Hx   . Colon polyps Neg Hx     Past Medical History:  Diagnosis Date  . Bilateral shoulder bursitis   . COPD (chronic obstructive pulmonary disease) with emphysema ([place])   . DDD (degenerative disc disease), lumbar    hx epideral injection  L5--S1   . Dysphasia    intermittant w/ food   . Dyspnea on exertion   . GERD (gastroesophageal reflux disease)   . Hiatal hernia   . History of acute pancreatitis    alcoholic pancreatitis [id number] and 01-30-2012  . History of colon polyps  10-17-2005  hyperplastic polyp's  . History of esophageal stricture    s/p  dilation 02-02-2017  . History of peptic ulcer 1990s  . History of pneumococcal pneumonia 2006   bilateral pneumonia w/ left lower lobe abscess treated w/ chest tube with suction for drainage  . History of pneumothorax    1984--  left spontaneous pneumothorax ,  treated w/ chest tube  . Hypertension   . Malignant neoplasm of prostate [facility]) urologist-  dr dahlstedt/  oncologist -- dr Tammi Klippel   dx 12-02-2016-- Stage T2b, Gleason 3+4,  PSA 5.99,  vol 25cc  . Numbness of left foot    outer left ankle numb--  per pt has appt. w/ neurologist  . OSA (obstructive sleep apnea)    per pt has not used cpap over year ago from 04-16-2017--- per study 01-17-2010  severe osa  . Wears glasses     Past Surgical History:  Procedure Laterality Date  . BIOPSY  02/02/2017   Procedure: BIOPSY;  Surgeon: Daneil Dolin, MD;  [facility];  Service: Endoscopy;;  duodenal bx's, esophgeal bx's  . CARDIOVASCULAR STRESS TEST  09/12/2009   normal nuclear perfusion study w/ no ischemia /  normal LV function and wall motion , ef 57%  . COLONOSCOPY  2007   Dr. Gala Romney: hyperplastic polyps, internal hemorrhoids   . ESOPHAGOGASTRODUODENOSCOPY (EGD) WITH PROPOFOL N/A 02/02/2017   Procedure: ESOPHAGOGASTRODUODENOSCOPY (EGD) WITH PROPOFOL;  Surgeon: Daneil Dolin, MD;  [facility];  Service: Endoscopy;  Laterality: N/A;  2:00pm  . HEMORRHOID SURGERY  01-21-2008    Forestine Na  . MALONEY DILATION N/A 02/02/2017   Procedure: Venia Minks DILATION;  Surgeon: Daneil Dolin, MD;  [facility];  Service: Endoscopy;  Laterality: N/A;  . TRANSTHORACIC ECHOCARDIOGRAM  03/28/2009   mild LVH, ef 55-60%/ mild aorta calcification  . VIDEO ASSISTED THORACOSCOPY (VATS)/DECORTICATION Left     Current Outpatient Prescriptions  Medication Sig Dispense Refill  . ipratropium-albuterol (DUONEB) 0.5-2.5 (3) MG/3ML SOLN Take 3 mLs by nebulization every 6 (six) hours as needed for shortness of breath.    . pantoprazole (PROTONIX) 40 MG tablet Take 1 tablet (40 mg total) by mouth daily. 30 minutes before breakfast 90 tablet 3  . ibuprofen (ADVIL,MOTRIN) 200 MG tablet Take 400 mg by mouth daily as needed for mild pain.     No current facility-administered medications for this visit.     Allergies as of 04/21/2017  . (No Known Allergies)    Vitals: BP 125/75   Pulse 91   Ht 6\' 2"  (1.88 m)   Wt 225 lb (102.1 kg)   BMI 28.89 kg/m  Last Weight:  Wt Readings from Last 1 Encounters:  04/21/17 225 lb (102.1 kg)    Last Height:   Ht Readings from Last 1 Encounters:  04/21/17 6\' 2"  (1.88 m)   Physical exam: Exam: Gen: NAD, conversant, well nourised, obese, well groomed                     CV: RRR, no MRG. No Carotid Bruits. +Left foot peripheral edema, warm, nontender Eyes: Conjunctivae clear without exudates or hemorrhage  Neuro: Detailed Neurologic Exam  Speech:    Speech is normal; fluent and spontaneous with normal comprehension.  Cognition:    The patient is oriented to person, place, and time;     recent and remote memory intact;     language fluent;     normal attention, concentration,  fund of knowledge Cranial Nerves:    The pupils are equal, round, and reactive to light. The fundi are normal and spontaneous venous pulsations are present. Visual fields are full to finger confrontation. Extraocular movements are intact. Trigeminal sensation is intact and the muscles of mastication are normal. The face is symmetric. The palate elevates in the midline. Hearing intact. Voice is normal. Shoulder shrug is normal. The tongue has normal motion without fasciculations.   Coordination:    No dysmetria  Gait:    Not ataxic  Motor Observation:    No asymmetry, no atrophy, and no involuntary movements noted. Tone:    Normal muscle tone.    Posture:    Posture is normal. normal erect    Strength:    Strength is V/V in the upper and lower limbs.      Sensation: Decreased sensation left lower leg from above the ankle to the outer toes in a peroneal/sural distribution     Reflex Exam:  DTR's:    Deep tendon reflexes in the upper and lower extremities are normal bilaterally.   Toes:    The toes are downgoing bilaterally.   Clonus:    Clonus is absent.       Assessment/Plan:  Decreased sensation left lower leg from above the ankle to the outer toes in a peroneal/sural distribution. He has had chronic low back pain with right radiculopathy into the left leg however I suspect  that there is local nerve damage due to swelling of the left foot that started around the same time as the localized numbness. I do recommend managing the numbness and the nerve may regenerate however he may have ongoing deficits in that distribution. He has already seen a surgeon for his low back so I do not recommend MRI of the lumbar spine especially given there is no motor deficit and ankle jerks are intact, this is likely localized nerve compression from swelling. I do recommend imaging of the left foot for injuries causing the swelling considering the right foot is unaffected.   Xray foot and then likely recommend possibly MRI imaging of the foot to rule out injury of the soft tissues or ligaments, possibly and MRI of the foot.   Cc: Dr. Karie Kirks  Orders Placed This Encounter  Procedures  . DG Foot Complete Left    Sarina Ill, MD  [facility] [address] [place], [place] [address]  Phone [phone] Fax [phone]

## 2017-04-23 ENCOUNTER — Encounter (HOSPITAL_BASED_OUTPATIENT_CLINIC_OR_DEPARTMENT_OTHER): Payer: Self-pay | Admitting: *Deleted

## 2017-04-23 ENCOUNTER — Encounter (HOSPITAL_BASED_OUTPATIENT_CLINIC_OR_DEPARTMENT_OTHER)
Admission: RE | Disposition: A | Discharge status: Home / Self Care | Payer: Self-pay | Source: Ambulatory Visit | Attending: Urology

## 2017-04-23 ENCOUNTER — Ambulatory Visit (HOSPITAL_BASED_OUTPATIENT_CLINIC_OR_DEPARTMENT_OTHER): Payer: 59 | Admitting: Certified Registered"

## 2017-04-23 ENCOUNTER — Ambulatory Visit (HOSPITAL_BASED_OUTPATIENT_CLINIC_OR_DEPARTMENT_OTHER)
Admission: RE | Admit: 2017-04-23 | Discharge: 2017-04-23 | Disposition: A | Discharge status: Home / Self Care | Payer: 59 | Source: Ambulatory Visit | Attending: Urology | Admitting: Urology

## 2017-04-23 ENCOUNTER — Ambulatory Visit (HOSPITAL_COMMUNITY): Payer: 59

## 2017-04-23 DIAGNOSIS — Z86010 Personal history of colonic polyps: Secondary | ICD-10-CM | POA: Insufficient documentation

## 2017-04-23 DIAGNOSIS — G4730 Sleep apnea, unspecified: Secondary | ICD-10-CM | POA: Insufficient documentation

## 2017-04-23 DIAGNOSIS — G709 Myoneural disorder, unspecified: Secondary | ICD-10-CM | POA: Insufficient documentation

## 2017-04-23 DIAGNOSIS — I10 Essential (primary) hypertension: Secondary | ICD-10-CM | POA: Insufficient documentation

## 2017-04-23 DIAGNOSIS — G4733 Obstructive sleep apnea (adult) (pediatric): Secondary | ICD-10-CM | POA: Insufficient documentation

## 2017-04-23 DIAGNOSIS — Z8711 Personal history of peptic ulcer disease: Secondary | ICD-10-CM | POA: Insufficient documentation

## 2017-04-23 DIAGNOSIS — J449 Chronic obstructive pulmonary disease, unspecified: Secondary | ICD-10-CM | POA: Insufficient documentation

## 2017-04-23 DIAGNOSIS — Z9889 Other specified postprocedural states: Secondary | ICD-10-CM | POA: Insufficient documentation

## 2017-04-23 DIAGNOSIS — Z01818 Encounter for other preprocedural examination: Secondary | ICD-10-CM

## 2017-04-23 DIAGNOSIS — M5136 Other intervertebral disc degeneration, lumbar region: Secondary | ICD-10-CM | POA: Insufficient documentation

## 2017-04-23 DIAGNOSIS — C61 Malignant neoplasm of prostate: Secondary | ICD-10-CM | POA: Insufficient documentation

## 2017-04-23 DIAGNOSIS — K219 Gastro-esophageal reflux disease without esophagitis: Secondary | ICD-10-CM | POA: Insufficient documentation

## 2017-04-23 DIAGNOSIS — Z8719 Personal history of other diseases of the digestive system: Secondary | ICD-10-CM | POA: Insufficient documentation

## 2017-04-23 DIAGNOSIS — K449 Diaphragmatic hernia without obstruction or gangrene: Secondary | ICD-10-CM | POA: Insufficient documentation

## 2017-04-23 DIAGNOSIS — M1990 Unspecified osteoarthritis, unspecified site: Secondary | ICD-10-CM | POA: Insufficient documentation

## 2017-04-23 DIAGNOSIS — Z87891 Personal history of nicotine dependence: Secondary | ICD-10-CM | POA: Insufficient documentation

## 2017-04-23 HISTORY — DX: Obstructive sleep apnea (adult) (pediatric): G47.33

## 2017-04-23 HISTORY — DX: Personal history of peptic ulcer disease: Z87.11

## 2017-04-23 HISTORY — PX: SPACE OAR INSTILLATION: SHX6769

## 2017-04-23 HISTORY — DX: Anesthesia of skin: R20.0

## 2017-04-23 HISTORY — DX: Dyspnea, unspecified: R06.00

## 2017-04-23 HISTORY — DX: Other disturbances of skin sensation: R20.8

## 2017-04-23 HISTORY — DX: Personal history of pneumonia (recurrent): Z87.01

## 2017-04-23 HISTORY — DX: Dysphasia: R47.02

## 2017-04-23 HISTORY — DX: Other forms of dyspnea: R06.09

## 2017-04-23 HISTORY — DX: Other intervertebral disc degeneration, lumbar region: M51.36

## 2017-04-23 HISTORY — DX: Personal history of other diseases of the digestive system: Z87.19

## 2017-04-23 HISTORY — DX: Bursitis of right shoulder: M75.51

## 2017-04-23 HISTORY — DX: Diaphragmatic hernia without obstruction or gangrene: K44.9

## 2017-04-23 HISTORY — DX: Personal history of colon polyps, unspecified: Z86.0100

## 2017-04-23 HISTORY — DX: Bursitis of right shoulder: M75.52

## 2017-04-23 HISTORY — DX: Emphysema, unspecified: J43.9

## 2017-04-23 HISTORY — DX: Other intervertebral disc degeneration, lumbar region without mention of lumbar back pain or lower extremity pain: M51.369

## 2017-04-23 HISTORY — DX: Personal history of colonic polyps: Z86.010

## 2017-04-23 HISTORY — DX: Personal history of other diseases of the respiratory system: Z87.09

## 2017-04-23 HISTORY — PX: RADIOACTIVE SEED IMPLANT: SHX5150

## 2017-04-23 HISTORY — DX: Presence of spectacles and contact lenses: Z97.3

## 2017-04-23 SURGERY — INSERTION, RADIATION SOURCE, PROSTATE
Anesthesia: General | Site: Prostate

## 2017-04-23 MED ORDER — DEXAMETHASONE SODIUM PHOSPHATE 10 MG/ML IJ SOLN
INTRAMUSCULAR | Status: AC
  Filled 2017-04-23: qty 1

## 2017-04-23 MED ORDER — DEXAMETHASONE SODIUM PHOSPHATE 10 MG/ML IJ SOLN
INTRAMUSCULAR | Status: DC | PRN
  Administered 2017-04-23: 10 mg via INTRAVENOUS

## 2017-04-23 MED ORDER — CEFAZOLIN SODIUM-DEXTROSE 2-4 GM/100ML-% IV SOLN
INTRAVENOUS | Status: AC
  Filled 2017-04-23: qty 100

## 2017-04-23 MED ORDER — CEFAZOLIN SODIUM-DEXTROSE 2-4 GM/100ML-% IV SOLN
2.0000 g | Freq: Once | INTRAVENOUS | Status: AC
  Administered 2017-04-23: 2 g via INTRAVENOUS
  Filled 2017-04-23: qty 100

## 2017-04-23 MED ORDER — EPHEDRINE 5 MG/ML INJ
INTRAVENOUS | Status: AC
  Filled 2017-04-23: qty 10

## 2017-04-23 MED ORDER — TRAMADOL HCL 50 MG PO TABS: 50.0000 mg | ORAL_TABLET | Freq: Four times a day (QID) | ORAL | 0 refills | Status: DC | PRN

## 2017-04-23 MED ORDER — IOHEXOL 300 MG/ML  SOLN
INTRAMUSCULAR | Status: DC | PRN
  Administered 2017-04-23: 7 mL

## 2017-04-23 MED ORDER — FLEET ENEMA 7-19 GM/118ML RE ENEM
1.0000 | ENEMA | Freq: Once | RECTAL | Status: DC
  Filled 2017-04-23: qty 1

## 2017-04-23 MED ORDER — FENTANYL CITRATE (PF) 100 MCG/2ML IJ SOLN
INTRAMUSCULAR | Status: DC | PRN
  Administered 2017-04-23 (×3): 25 ug via INTRAVENOUS
  Administered 2017-04-23: 50 ug via INTRAVENOUS
  Administered 2017-04-23: 25 ug via INTRAVENOUS

## 2017-04-23 MED ORDER — OXYCODONE HCL 5 MG PO TABS
5.0000 mg | ORAL_TABLET | Freq: Once | ORAL | Status: DC | PRN
  Filled 2017-04-23: qty 1

## 2017-04-23 MED ORDER — LACTATED RINGERS IV SOLN
INTRAVENOUS | Status: DC
  Administered 2017-04-23 (×2): via INTRAVENOUS
  Filled 2017-04-23: qty 1000

## 2017-04-23 MED ORDER — SODIUM CHLORIDE 0.9 % IV SOLN
INTRAVENOUS | Status: AC | PRN
  Administered 2017-04-23: 1000 mL

## 2017-04-23 MED ORDER — PROPOFOL 10 MG/ML IV BOLUS
INTRAVENOUS | Status: DC | PRN
  Administered 2017-04-23: 170 mg via INTRAVENOUS

## 2017-04-23 MED ORDER — OXYCODONE HCL 5 MG/5ML PO SOLN
5.0000 mg | Freq: Once | ORAL | Status: DC | PRN
  Filled 2017-04-23: qty 5

## 2017-04-23 MED ORDER — ONDANSETRON HCL 4 MG/2ML IJ SOLN
4.0000 mg | Freq: Four times a day (QID) | INTRAMUSCULAR | Status: DC | PRN
  Filled 2017-04-23: qty 2

## 2017-04-23 MED ORDER — MIDAZOLAM HCL 2 MG/2ML IJ SOLN
INTRAMUSCULAR | Status: AC
  Filled 2017-04-23: qty 2

## 2017-04-23 MED ORDER — LIDOCAINE 2% (20 MG/ML) 5 ML SYRINGE
INTRAMUSCULAR | Status: AC
  Filled 2017-04-23: qty 5

## 2017-04-23 MED ORDER — STERILE WATER FOR IRRIGATION IR SOLN
Status: DC | PRN
  Administered 2017-04-23: 500 mL

## 2017-04-23 MED ORDER — FENTANYL CITRATE (PF) 100 MCG/2ML IJ SOLN
25.0000 ug | INTRAMUSCULAR | Status: DC | PRN
  Filled 2017-04-23: qty 1

## 2017-04-23 MED ORDER — EPHEDRINE SULFATE-NACL 50-0.9 MG/10ML-% IV SOSY
PREFILLED_SYRINGE | INTRAVENOUS | Status: DC | PRN
  Administered 2017-04-23: 10 mg via INTRAVENOUS

## 2017-04-23 MED ORDER — SODIUM CHLORIDE 0.9 % IJ SOLN
INTRAMUSCULAR | Status: DC | PRN
  Administered 2017-04-23: 50 mL

## 2017-04-23 MED ORDER — FENTANYL CITRATE (PF) 100 MCG/2ML IJ SOLN
INTRAMUSCULAR | Status: AC
  Filled 2017-04-23: qty 2

## 2017-04-23 MED ORDER — ONDANSETRON HCL 4 MG/2ML IJ SOLN
INTRAMUSCULAR | Status: DC | PRN
  Administered 2017-04-23: 4 mg via INTRAVENOUS

## 2017-04-23 MED ORDER — KETOROLAC TROMETHAMINE 30 MG/ML IJ SOLN
INTRAMUSCULAR | Status: AC
Start: 2017-04-23 — End: ?
  Filled 2017-04-23: qty 1

## 2017-04-23 MED ORDER — LIDOCAINE 2% (20 MG/ML) 5 ML SYRINGE
INTRAMUSCULAR | Status: DC | PRN
  Administered 2017-04-23: 100 mg via INTRAVENOUS

## 2017-04-23 MED ORDER — MIDAZOLAM HCL 2 MG/2ML IJ SOLN
INTRAMUSCULAR | Status: DC | PRN
  Administered 2017-04-23: 2 mg via INTRAVENOUS

## 2017-04-23 MED ORDER — ONDANSETRON HCL 4 MG/2ML IJ SOLN
INTRAMUSCULAR | Status: AC
  Filled 2017-04-23: qty 2

## 2017-04-23 MED ORDER — PROPOFOL 10 MG/ML IV BOLUS
INTRAVENOUS | Status: AC
  Filled 2017-04-23: qty 20

## 2017-04-23 MED FILL — traMADol HCL 50 MG TABS: 50 | 2 days supply | Qty: 10 | Fill #0

## 2017-04-23 SURGICAL SUPPLY — 26 items
BAG URINE DRAINAGE (UROLOGICAL SUPPLIES) ×3 IMPLANT
BLADE CLIPPER SURG (BLADE) ×3 IMPLANT
CATH FOLEY 2WAY SLVR  5CC 16FR (CATHETERS) ×2
CATH FOLEY 2WAY SLVR 5CC 16FR (CATHETERS) ×1 IMPLANT
CATH ROBINSON RED A/P 20FR (CATHETERS) ×3 IMPLANT
CLOTH BEACON ORANGE TIMEOUT ST (SAFETY) ×3 IMPLANT
COVER BACK TABLE 60X90IN (DRAPES) ×3 IMPLANT
COVER MAYO STAND STRL (DRAPES) ×3 IMPLANT
DRSG TEGADERM 4X4.75 (GAUZE/BANDAGES/DRESSINGS) ×3 IMPLANT
DRSG TEGADERM 8X12 (GAUZE/BANDAGES/DRESSINGS) ×3 IMPLANT
GAUZE SPONGE 4X4 12PLY STRL LF (GAUZE/BANDAGES/DRESSINGS) ×3 IMPLANT
GLOVE BIO SURGEON STRL SZ8 (GLOVE) ×6 IMPLANT
GLOVE ECLIPSE 8.0 STRL XLNG CF (GLOVE) ×9 IMPLANT
GOWN STRL REUS W/TWL XL LVL3 (GOWN DISPOSABLE) ×3 IMPLANT
HOLDER FOLEY CATH W/STRAP (MISCELLANEOUS) ×3 IMPLANT
IMPL SPACEOAR SYSTEM 10ML (MISCELLANEOUS) ×1 IMPLANT
IMPLANT SPACEOAR SYSTEM 10ML (MISCELLANEOUS) ×3
IV NS 1000ML (IV SOLUTION) ×3
IV NS 1000ML BAXH (IV SOLUTION) ×1 IMPLANT
KIT RM TURNOVER CYSTO AR (KITS) ×3 IMPLANT
PACK CYSTO (CUSTOM PROCEDURE TRAY) ×3 IMPLANT
SUT BONE WAX W31G (SUTURE) IMPLANT
SYRINGE 10CC LL (SYRINGE) ×3 IMPLANT
UNDERPAD 30X30 INCONTINENT (UNDERPADS AND DIAPERS) ×6 IMPLANT
WATER STERILE IRR 500ML POUR (IV SOLUTION) ×3 IMPLANT
radioactive seed ×237 IMPLANT

## 2017-04-23 NOTE — Anesthesia Postprocedure Evaluation (Signed)
Anesthesia Post Note  Patient: [name]  Procedure(s) Performed: Procedure(s) (LRB): RADIOACTIVE SEED IMPLANT/BRACHYTHERAPY IMPLANT (N/A) SPACE OAR INSTILLATION (N/A)     Patient location during evaluation: PACU Anesthesia Type: General Level of consciousness: awake Pain management: pain level controlled Vital Signs Assessment: post-procedure vital signs reviewed and stable Respiratory status: spontaneous breathing Cardiovascular status: stable Anesthetic complications: no    Last Vitals:  Vitals:   04/23/17 1139 04/23/17 1426  BP: 130/85 (!) 147/90  Pulse: 89 92  Resp: 20 18  Temp: 36.5 C 36.8 C  SpO2: 98% 90%    Last Pain:  Vitals:   04/23/17 1139  TempSrc: Oral                 Karina Lenderman

## 2017-04-23 NOTE — H&P (Signed)
H&P  Chief Complaint: Prostate cancer  History of Present Illness: 61 year old male with history of prostate cancer.  He presents at this time for  curative therapy with I-125 brachytherapy.  History is as follows:  He underwent ultrasound and biopsy of his prostate on 12/02/2016. He had a 4 mm left apical nodule, a PSA of 5.99. Prostatic volume was 17 g. PSA density 0.35.   6/12 cores were positive for adenocarcinoma, all on the left side of the prostate.  3 cores revealed Gleason 3+3 = 6 pattern  3 cores revealed Gleason 3+4 = 7 pattern.  He consulted with Dr. Tammi Klippel, and presents at this time for treatment.   Past Medical History:  Diagnosis Date  . Bilateral shoulder bursitis   . COPD (chronic obstructive pulmonary disease) with emphysema ([place])   . DDD (degenerative disc disease), lumbar    hx epideral injection  L5--S1   . Dysphasia    intermittant w/ food   . Dyspnea on exertion   . GERD (gastroesophageal reflux disease)   . Hiatal hernia   . History of acute pancreatitis    alcoholic pancreatitis [id number] and 01-30-2012  . History of colon polyps    10-17-2005  hyperplastic polyp's  . History of esophageal stricture    s/p  dilation 02-02-2017  . History of peptic ulcer 1990s  . History of pneumococcal pneumonia 2006   bilateral pneumonia w/ left lower lobe abscess treated w/ chest tube with suction for drainage  . History of pneumothorax    1984--  left spontaneous pneumothorax ,  treated w/ chest tube  . Hypertension   . Malignant neoplasm of prostate [facility]) urologist-  dr Kerron Sedano/  oncologist -- dr Tammi Klippel   dx 12-02-2016-- Stage T2b, Gleason 3+4,  PSA 5.99,  vol 25cc  . Numbness of left foot    outer left ankle numb-- per pt has appt. w/ neurologist  . OSA (obstructive sleep apnea)    per pt has not used cpap over year ago from 04-16-2017--- per study 01-17-2010  severe osa  . Wears glasses     Past Surgical History:  Procedure Laterality Date  . BIOPSY   02/02/2017   Procedure: BIOPSY;  Surgeon: Daneil Dolin, MD;  [facility];  Service: Endoscopy;;  duodenal bx's, esophgeal bx's  . CARDIOVASCULAR STRESS TEST  09/12/2009   normal nuclear perfusion study w/ no ischemia /  normal LV function and wall motion , ef 57%  . COLONOSCOPY  2007   Dr. Gala Romney: hyperplastic polyps, internal hemorrhoids   . ESOPHAGOGASTRODUODENOSCOPY (EGD) WITH PROPOFOL N/A 02/02/2017   Procedure: ESOPHAGOGASTRODUODENOSCOPY (EGD) WITH PROPOFOL;  Surgeon: Daneil Dolin, MD;  [facility];  Service: Endoscopy;  Laterality: N/A;  2:00pm  . HEMORRHOID SURGERY  01-21-2008    Forestine Na  . MALONEY DILATION N/A 02/02/2017   Procedure: Venia Minks DILATION;  Surgeon: Daneil Dolin, MD;  [facility];  Service: Endoscopy;  Laterality: N/A;  . TRANSTHORACIC ECHOCARDIOGRAM  03/28/2009   mild LVH, ef 55-60%/ mild aorta calcification  . VIDEO ASSISTED THORACOSCOPY (VATS)/DECORTICATION Left     Home Medications:  Allergies as of 04/23/2017   No Known Allergies     Medication List    Notice   Cannot display discharge medications because the patient has not yet been admitted.     Allergies: No Known Allergies  Family History  Problem Relation Age of Onset  . Cancer Neg Hx   . Colon cancer Neg  Hx   . Colon polyps Neg Hx   . Neuropathy Neg Hx     Social History:  reports that he quit smoking about 17 months ago. His smoking use included Cigarettes. He has a 30.00 pack-year smoking history. He has never used smokeless tobacco. He reports that he drinks about 12.6 oz of alcohol per week . He reports that he does not use drugs.  ROS: A complete review of systems was performed.  All systems are negative except for pertinent findings as noted.  Physical Exam:  Vital signs in last 24 hours:   Constitutional:  Alert and oriented, No acute distress Cardiovascular: Regular rate and rhythm, No JVD Respiratory: Normal respiratory effort, Lungs clear  bilaterally GI: Abdomen is soft, nontender, nondistended, no abdominal masses Genitourinary: No CVAT. Normal male phallus, testes are descended bilaterally and non-tender and without masses, scrotum is normal in appearance without lesions or masses, perineum is normal on inspection. Rectal: Normal sphincter tone, no rectal masses, prostate is non tender and without nodularity. Prostate size is estimated to be 30 cc Lymphatic: No lymphadenopathy Neurologic: Grossly intact, no focal deficits Psychiatric: Normal mood and affect  Laboratory Data:  No results for input(s): WBC, HGB, HCT, PLT in the last 72 hours.  No results for input(s): NA, K, CL, GLUCOSE, BUN, CALCIUM, CREATININE in the last 72 hours.  Invalid input(s): CO3   No results found for this or any previous visit (from the past 24 hour(s)). No results found for this or any previous visit (from the past 240 hour(s)).  Renal Function:  Recent Labs  04/20/17 0705  CREATININE 1.00   Estimated Creatinine Clearance: 99.6 mL/min (by C-G formula based on SCr of 1 mg/dL).  Radiologic Imaging: Dg Foot Complete Left  Result Date: 04/21/2017 CLINICAL DATA:  Chronic numbness the left foot for over a year, pain and swelling EXAM: LEFT FOOT - COMPLETE 3+ VIEW COMPARISON:  None. FINDINGS: Tarsal-metatarsal alignment is normal. Only mild degenerative changes noted at the left first MTP joint with some loss of joint space and minimal spurring. No erosion is seen. IMPRESSION: No acute abnormality. Minimal degenerative change of the left first MTP joint. Electronically Signed   By: Ivar Drape M.D.   On: 04/21/2017 09:00    Impression/Assessment:  Adenocarcinoma prostate  Plan:  I-125 brachytherapy

## 2017-04-23 NOTE — Discharge Instructions (Signed)
Radioactive Seed Implant Home Care Instructions   Activity:    Rest for the remainder of the day.  Do not drive or operate equipment today.  You may resume normal  activities in a few days as instructed by your physician, without risk of harmful radiation exposure to those around you, provided you follow the time and distance precautions on the Radiation Oncology Instruction Sheet.   Meals: Drink plenty of lipuids and eat light foods, such as gelatin or soup this evening .  You may return to normal meal plan tomorrow.  Return To Work: You may return to work as instructed by your physician.  Special Instruction:   If any seeds are found, use tweezers to pick up seeds and place in a glass container of any kind and bring to your physician's office.  Call your physician if any of these symptoms occur:   Persistent or heavy bleeding  Urine stream diminishes or stops completely after catheter is removed  Fever equal to or greater than 101 degrees F  Cloudy urine with a strong foul odor  Severe pain  You may feel some burning pain and/or hesitancy when you urinate after the catheter is removed.  These symptoms may increase over the next few weeks, but should diminish within forur to six weeks.  Applying moist heat to the lower abdomen or a hot tub bath may help relieve the pain.  If the discomfort becomes severe, please call your physician for additional medications.     Post Anesthesia Home Care Instructions  Activity: Get plenty of rest for the remainder of the day. A responsible individual must stay with you for 24 hours following the procedure.  For the next 24 hours, DO NOT: -Drive a car -Operate machinery -Drink alcoholic beverages -Take any medication unless instructed by your physician -Make any legal decisions or sign important papers.  Meals: Start with liquid foods such as gelatin or soup. Progress to regular foods as tolerated. Avoid greasy, spicy, heavy foods. If  nausea and/or vomiting occur, drink only clear liquids until the nausea and/or vomiting subsides. Call your physician if vomiting continues.  Special Instructions/Symptoms: Your throat may feel dry or sore from the anesthesia or the breathing tube placed in your throat during surgery. If this causes discomfort, gargle with warm salt water. The discomfort should disappear within 24 hours.  If you had a scopolamine patch placed behind your ear for the management of post- operative nausea and/or vomiting:  1. The medication in the patch is effective for 72 hours, after which it should be removed.  Wrap patch in a tissue and discard in the trash. Wash hands thoroughly with soap and water. 2. You may remove the patch earlier than 72 hours if you experience unpleasant side effects which may include dry mouth, dizziness or visual disturbances. 3. Avoid touching the patch. Wash your hands with soap and water after contact with the patch.     

## 2017-04-23 NOTE — Op Note (Addendum)
Preoperative diagnosis: Clinical stage TI C adenocarcinoma the prostate   Postoperative diagnosis: Same   Procedure: I-125 prostate seed implantation with Nucletron robotic implanter, placement of Space OAR, flexible cystoscopy  Surgeon: Lillette Boxer. Itzel Mckibbin M.D.  Radiation Oncologist: Tyler Pita, M.D.  Anesthesia: Gen.   Indications: Patient  was diagnosed with clinical stage TI C prostate cancer. We had extensive discussion with him about treatment options versus. He elected to proceed with seed implantation. He underwent consultation my office as well as with Dr. Tammi Klippel. He appeared to understand the advantages disadvantages potential risks of this treatment option. Full informed consent has been obtained.   Technique and findings: Patient was brought the operating room where he had successful induction of general anesthesia. He was placed in dorso-lithotomy position and prepped and draped in usual manner. Appropriate surgical timeout was performed. Radiation oncology department placed a transrectal ultrasound probe anchoring stand. Foley catheter with contrast in the balloon was inserted without difficulty. Anchoring needles were placed within the prostate. Rectal tube was placed. Real-time contouring of the urethra prostate and rectum were performed and the dosing parameters were established. Targeted dose was 145 gray.  I was then called  to the operating suite suite for placement of the needles. A second timeout was performed. All needle passage was done with real-time transrectal ultrasound guidance with the sagittal plane. A total of 21 needles were placed. The implantation itself was done with the robotic implanter.  79 active seeds were implanted.  Following placement of all seeds, the brachytherapy template and fixation needles were removed.   I then proceeded with placement of SpaceOARby introducing a needle with the bevel angled inferiorly approximately 2 cm superior to the  anus. This was angled downward and under direct ultrasound was placed within the space between the prostatic capsule and rectum. This was confirmed with a small amount of sterile saline injected and this was performed under direct ultrasound. I then attached the SpaceOARto the needle and injected this in the space between the prostate and rectum with good placement noted. The Foley catheter was removed and flexible cystoscopy failed to show any seeds outside the prostate.  The patient was brought to recovery room in stable condition, having tolerated the procedure well.Marland Kitchen

## 2017-04-23 NOTE — Anesthesia Procedure Notes (Signed)
Procedure Name: LMA Insertion Date/Time: 04/23/2017 12:48 PM Performed by: Denna Haggard D Pre-anesthesia Checklist: Patient identified, Emergency Drugs available, Suction available and Patient being monitored Patient Re-evaluated:Patient Re-evaluated prior to induction Oxygen Delivery Method: Circle system utilized Preoxygenation: Pre-oxygenation with 100% oxygen Induction Type: IV induction Ventilation: Mask ventilation without difficulty LMA: LMA inserted LMA Size: 4.0 Number of attempts: 1 Airway Equipment and Method: Bite block Placement Confirmation: positive ETCO2 Tube secured with: Tape Dental Injury: Teeth and Oropharynx as per pre-operative assessment

## 2017-04-23 NOTE — Transfer of Care (Signed)
Immediate Anesthesia Transfer of Care Note  Patient: [name]  Procedure(s) Performed: Procedure(s) (LRB): RADIOACTIVE SEED IMPLANT/BRACHYTHERAPY IMPLANT (N/A) SPACE OAR INSTILLATION (N/A)  Patient Location: PACU  Anesthesia Type: General  Level of Consciousness: awake, oriented, sedated and patient cooperative  Airway & Oxygen Therapy: Patient Spontanous Breathing and Patient connected to face mask oxygen  Post-op Assessment: Report given to PACU RN and Post -op Vital signs reviewed and stable  Post vital signs: Reviewed and stable  Complications: No apparent anesthesia complications Last Vitals:  Vitals:   04/23/17 1139 04/23/17 1426  BP: 130/85 (!) (P) 147/90  Pulse: 89 (P) 92  Resp: 20 (P) 18  Temp: 36.5 C (P) 36.8 C  SpO2: 98% (P) 90%    Last Pain:  Vitals:   04/23/17 1139  TempSrc: Oral      Patients Stated Pain Goal: 8 (04/23/17 1205)

## 2017-04-23 NOTE — Anesthesia Preprocedure Evaluation (Signed)
Anesthesia Evaluation  Patient identified by MRN, date of birth, ID band Patient awake    Reviewed: Allergy & Precautions, H&P , NPO status , Patient's Chart, lab work & pertinent test results  Airway Mallampati: II   Neck ROM: full    Dental   Pulmonary shortness of breath, sleep apnea , COPD, former smoker,    breath sounds clear to auscultation       Cardiovascular hypertension,  Rhythm:regular Rate:Normal     Neuro/Psych  Neuromuscular disease    GI/Hepatic hiatal hernia, GERD  ,  Endo/Other    Renal/GU      Musculoskeletal  (+) Arthritis ,   Abdominal   Peds  Hematology   Anesthesia Other Findings   Reproductive/Obstetrics                             Anesthesia Physical Anesthesia Plan  ASA: III  Anesthesia Plan: General   Post-op Pain Management:    Induction: Intravenous  PONV Risk Score and Plan: 2 and Ondansetron, Dexamethasone, Midazolam and Treatment may vary due to age or medical condition  Airway Management Planned: LMA  Additional Equipment:   Intra-op Plan:   Post-operative Plan:   Informed Consent: I have reviewed the patients History and Physical, chart, labs and discussed the procedure including the risks, benefits and alternatives for the proposed anesthesia with the patient or authorized representative who has indicated his/her understanding and acceptance.     Plan Discussed with: CRNA, Anesthesiologist and Surgeon  Anesthesia Plan Comments:         Anesthesia Quick Evaluation

## 2017-04-24 ENCOUNTER — Encounter (HOSPITAL_BASED_OUTPATIENT_CLINIC_OR_DEPARTMENT_OTHER): Payer: Self-pay | Admitting: Urology

## 2017-04-27 NOTE — Progress Notes (Signed)
  Radiation Oncology         [phone] ________________________________  Name: [name] MRN: [id number]  Date: 04/27/2017  DOB: 1956-05-09       Prostate Seed Implant  [id number], Richardson Landry, MD  No ref. provider found  DIAGNOSIS: 61 y.o. gentleman with Stage T2b adenocarcinoma of the prostate with Gleason Score of 3+4, and PSA of 5.99  PROCEDURE: Insertion of radioactive I-125 seeds into the prostate gland.  RADIATION DOSE: 145 Gy, definitive therapy.  TECHNIQUE: [name] was brought to the operating room with the urologist. He was placed in the dorsolithotomy position. He was catheterized and a rectal tube was inserted. The perineum was shaved, prepped and draped. The ultrasound probe was then introduced into the rectum to see the prostate gland.  TREATMENT DEVICE: A needle grid was attached to the ultrasound probe stand and anchor needles were placed.  3D PLANNING: The prostate was imaged in 3D using a sagittal sweep of the prostate probe. These images were transferred to the planning computer. There, the prostate, urethra and rectum were defined on each axial reconstructed image. Then, the software created an optimized 3D plan and a few seed positions were adjusted. The quality of the plan was reviewed using [facility] information for the target and the following two organs at risk:  Urethra and Rectum.  Then the accepted plan was uploaded to the seed Selectron afterloading unit.  PROSTATE VOLUME STUDY:  Using transrectal ultrasound the volume of the prostate was verified to be 31 cc.  SPECIAL TREATMENT PROCEDURE/SUPERVISION AND HANDLING: The Nucletron FIRST system was used to place the needles under sagittal guidance. A total of 20 needles were used to deposit 79 seeds in the prostate gland. The individual seed activity was 0.334 mCi.  SpaceOAR:  Yes  COMPLEX SIMULATION: At the end of the procedure, an anterior radiograph of the pelvis was obtained to document seed positioning  and count. Cystoscopy was performed to check the urethra and bladder.  MICRODOSIMETRY: At the end of the procedure, the patient was emitting 0.045 mR/hr at 1 meter. Accordingly, he was considered safe for hospital discharge.  PLAN: The patient will return to the radiation oncology clinic for post implant CT dosimetry in three weeks.   ________________________________  Sheral Apley Tammi Klippel, M.D.

## 2017-04-29 NOTE — Telephone Encounter (Signed)
[name], I wanted patient to discuss this with pcp. I ordered the xray but I wouldn;t know how to interpret an MRI of the foot, it was something I asked him to discuss with pcp to see if they wanted to do it, I tol dhim I was not comfortably ordering it thanks

## 2017-04-29 NOTE — Telephone Encounter (Signed)
[name] at Dr[facility] office is calling. Patient called their office and told them Dr. Jaynee Eagles wanted their office to order an MRI of his foot. Please call to advise.

## 2017-04-29 NOTE — Telephone Encounter (Signed)
Tried calling Clarise Cruz back at Dr [facility] office to relay Dr Cathren Laine message below. Per automated message, their office is closed starting today at 2pm and will re-open on Monday. Will try again next week.

## 2017-05-05 NOTE — Telephone Encounter (Signed)
Spoke to Clarise Cruz - she is aware of Dr. Cathren Laine response below.  Pt has an appt with his PCP to discuss his foot issue on 05/06/17.

## 2017-05-08 ENCOUNTER — Other Ambulatory Visit: Payer: Self-pay | Admitting: Urology

## 2017-05-08 DIAGNOSIS — C61 Malignant neoplasm of prostate: Secondary | ICD-10-CM

## 2017-05-11 ENCOUNTER — Telehealth: Payer: Self-pay | Admitting: *Deleted

## 2017-05-11 NOTE — Telephone Encounter (Signed)
CALLED PATIENT TO REMIND OF PRE SEED APPTS. FOR 05-14-17, SPOKE WITH PATIENT AND HE IS AWARE OF THESE APPTS.

## 2017-05-12 ENCOUNTER — Other Ambulatory Visit (HOSPITAL_COMMUNITY): Payer: Self-pay | Admitting: Family Medicine

## 2017-05-12 DIAGNOSIS — R200 Anesthesia of skin: Secondary | ICD-10-CM

## 2017-05-13 ENCOUNTER — Telehealth: Payer: Self-pay | Admitting: *Deleted

## 2017-05-13 NOTE — Telephone Encounter (Signed)
CALLED PATIENT TO REMIND OF APPTS, FOR MRI, AND POST SEED APPTS., SPOKE WITH PATIENT AND HE IS AWARE OF THESE APPTS.

## 2017-05-14 ENCOUNTER — Encounter: Payer: Self-pay | Admitting: Medical Oncology

## 2017-05-14 ENCOUNTER — Ambulatory Visit
Admission: RE | Admit: 2017-05-14 | Discharge: 2017-05-14 | Disposition: A | Discharge status: Home / Self Care | Payer: 59 | Source: Ambulatory Visit | Attending: Radiation Oncology | Admitting: Radiation Oncology

## 2017-05-14 ENCOUNTER — Ambulatory Visit (HOSPITAL_COMMUNITY)
Admission: RE | Admit: 2017-05-14 | Discharge: 2017-05-14 | Disposition: A | Discharge status: Home / Self Care | Payer: 59 | Source: Ambulatory Visit | Attending: Urology | Admitting: Urology

## 2017-05-14 DIAGNOSIS — I10 Essential (primary) hypertension: Secondary | ICD-10-CM | POA: Insufficient documentation

## 2017-05-14 DIAGNOSIS — C61 Malignant neoplasm of prostate: Secondary | ICD-10-CM | POA: Diagnosis present

## 2017-05-14 DIAGNOSIS — Z79899 Other long term (current) drug therapy: Secondary | ICD-10-CM | POA: Diagnosis not present

## 2017-05-14 DIAGNOSIS — J449 Chronic obstructive pulmonary disease, unspecified: Secondary | ICD-10-CM | POA: Insufficient documentation

## 2017-05-14 DIAGNOSIS — Z809 Family history of malignant neoplasm, unspecified: Secondary | ICD-10-CM | POA: Insufficient documentation

## 2017-05-14 DIAGNOSIS — K859 Acute pancreatitis without necrosis or infection, unspecified: Secondary | ICD-10-CM | POA: Insufficient documentation

## 2017-05-14 DIAGNOSIS — Z9889 Other specified postprocedural states: Secondary | ICD-10-CM | POA: Diagnosis not present

## 2017-05-14 NOTE — Progress Notes (Signed)
[name] states his seed implant went well.He has had minimal side effects. He will follow up with urologist in November.

## 2017-05-14 NOTE — Progress Notes (Signed)
  Radiation Oncology         [phone] ________________________________  Name: KYAIR DITOMMASO MRN: [id number]  Date: 05/14/2017  DOB: [birth year]  COMPLEX SIMULATION NOTE  NARRATIVE:  The patient was brought to the [place] suite today following prostate seed implantation approximately one month ago.  Identity was confirmed.  All relevant records and images related to the planned course of therapy were reviewed.  Then, the patient was set-up supine.  CT images were obtained.  The CT images were loaded into the planning software.  Then the prostate and rectum were contoured.  Treatment planning then occurred.  The implanted iodine 125 seeds were identified by the physics staff for projection of radiation distribution  I have requested : 3D Simulation  I have requested a DVH of the following structures: Prostate and rectum.    ________________________________  Sheral Apley Tammi Klippel, M.D.    This document serves as a record of services personally performed by Tyler Pita MD. It was created on his behalf by Delton Coombes, a trained medical scribe. The creation of this record is based on the scribe's personal observations and the provider's statements to them. This document has been checked and approved by the attending provider.

## 2017-05-14 NOTE — Progress Notes (Signed)
Weight and vitals stable. Denies pain. Pre seed IPSS 1. Post seed IPSS 9. Reports dysuria resolve however, intermittent tingling continues with urination. Denies hematuria. Denies urinary leakage or incontinence. Patient had MRI earlier this morning to confirm SpaceOar placement. Also, patient scheduled to follow up with urologist in November.   BP (!) 144/94 (BP Location: Right Arm, Patient Position: Sitting, Cuff Size: Large)   Pulse 78   Temp 98 F (36.7 C) (Oral)   Resp 16   Wt 224 lb 9.6 oz (101.9 kg)   SpO2 100%   BMI 28.84 kg/m  Wt Readings from Last 3 Encounters:  05/14/17 224 lb 9.6 oz (101.9 kg)  04/23/17 217 lb (98.4 kg)  04/21/17 225 lb (102.1 kg)

## 2017-05-14 NOTE — Progress Notes (Signed)
Radiation Oncology         [phone] ________________________________  Name: [name] MRN: [id number]  Date: 05/14/2017  DOB: 06/23/1956  Follow-Up Visit Note  CC: [name], MD  Franchot Gallo, MD  Diagnosis:   61 y.o. gentleman with Stage T2b adenocarcinoma of the prostate with Gleason Score of 3+4, and PSA of 5.99.    ICD-10-CM   1. Malignant neoplasm of prostate ([place]) C61     Interval Since Last Radiation: 3  weeks 04/23/17:   I-125 prostate seed implantation with Nucletron robotic implanter, placement of Space OAR, flexible cystoscopy  Narrative:  The patient returns today for routine follow-up.  He reports that he is doing well overall. He did not experience any severe adverse effects from the brachytherapy. He reports mild residual increased daytime frequency and urgency but denies dysuria, gross hematuria, weak flow of stream, incomplete emptying or incontinence.   He denies abdominal pain, nausea, vomiting, diarrhea or constipation. He reports a healthy appetite and is maintaining his weight. He denies recent fevers, chills or night sweats. He has not noticed any significant change in his energy level.                     ALLERGIES:  has No Known Allergies.  Meds: Current Outpatient Prescriptions  Medication Sig Dispense Refill  . ibuprofen (ADVIL,MOTRIN) 200 MG tablet Take 400 mg by mouth daily as needed for mild pain.    Marland Kitchen ipratropium-albuterol (DUONEB) 0.5-2.5 (3) MG/3ML SOLN Take 3 mLs by nebulization every 6 (six) hours as needed for shortness of breath.    . pantoprazole (PROTONIX) 40 MG tablet Take 1 tablet (40 mg total) by mouth daily. 30 minutes before breakfast 90 tablet 3  . SYMBICORT 160-4.5 MCG/ACT inhaler INHALE 2 PUFFS PO BID FOR COPD  11  . traMADol (ULTRAM) 50 MG tablet Take 1 tablet (50 mg total) by mouth every 6 (six) hours as needed. 10 tablet 0   No current facility-administered medications for this encounter.     Physical  Findings: The patient is in no acute distress. Patient is alert and oriented.  weight is 224 lb 9.6 oz (101.9 kg). His oral temperature is 98 F (36.7 C). His blood pressure is 144/94 (abnormal) and his pulse is 78. His respiration is 16 and oxygen saturation is 100%. .  No significant changes.  Lab Findings: Lab Results  Component Value Date   WBC 7.9 04/20/2017   HGB 15.8 04/20/2017   HCT 44.6 04/20/2017   PLT 167 04/20/2017    Lab Results  Component Value Date   NA 137 04/20/2017   K 4.0 04/20/2017   CO2 25 04/20/2017   GLUCOSE 111 (H) 04/20/2017   BUN 8 04/20/2017   CREATININE 1.00 04/20/2017   BILITOT 1.1 04/20/2017   ALKPHOS 59 04/20/2017   AST 45 (H) 04/20/2017   ALT 41 04/20/2017   PROT 7.7 04/20/2017   ALBUMIN 3.9 04/20/2017   CALCIUM 9.5 04/20/2017   ANIONGAP 13 04/20/2017    Radiographic Findings: Dg Foot Complete Left  Result Date: 04/21/2017 CLINICAL DATA:  Chronic numbness the left foot for over a year, pain and swelling EXAM: LEFT FOOT - COMPLETE 3+ VIEW COMPARISON:  None. FINDINGS: Tarsal-metatarsal alignment is normal. Only mild degenerative changes noted at the left first MTP joint with some loss of joint space and minimal spurring. No erosion is seen. IMPRESSION: No acute abnormality. Minimal degenerative change of the left first MTP joint. Electronically Signed  By: Ivar Drape M.D.   On: 04/21/2017 09:00   Dg C-arm 1-60 Min-no Report  Result Date: 04/23/2017 Fluoroscopy was utilized by the requesting physician.  No radiographic interpretation.   Impression/Plan:   1.  Stage T2b adenocarcinoma of the prostate with Gleason Score of 3+4, and PSA of 5.99. The patient is recovering well from the effects radiation. He is reassured that his urinary symptoms should continue to gradually improve over the next 4-6 months. He is encouraged by his improvement already and is otherwise pleased with his outcome.  Today we spent time talking to the patient about his  prostate seed implant and resolving urinary symptoms. We also talked about long-term follow-up of prostate cancer following seed implant. He understands that ongoing PSA determinations and digital rectal exams will help perform surveillance to rule out disease recurrence. He has a scheduled follow-up with Dr. Diona Fanti in November.  He understands what to expect with his PSA measures going forward. He was also educated about some of the long-term effects from radiation including a small risk for rectal bleeding and possible erectile dysfunction. We talked about some of the general management approaches to these potential complications.   His post-implant CT images were reviewed by Dr. Tammi Klippel and appear to have excellent coverage.  A copy of the detailed report will be forwarded to his urologist.  He is encouraged to contact our office or return at any point if he has any questions or concerns related to his previous radiation or prostate cancer. We look forward to continuing to follow his response to treatment through urology correspondence and are happy to see him back as needed.   Nicholos Johns, PA-C _____________________________________  Sheral Apley Tammi Klippel, M.D.  This document serves as a record of services personally performed by Tyler Pita MD. It was created on his behalf by Delton Coombes, a trained medical scribe. The creation of this record is based on the scribe's personal observations and the provider's statements to them. This document has been checked and approved by the attending provider.

## 2017-05-15 ENCOUNTER — Ambulatory Visit (HOSPITAL_COMMUNITY)
Admission: RE | Admit: 2017-05-15 | Discharge: 2017-05-15 | Disposition: A | Discharge status: Home / Self Care | Payer: 59 | Source: Ambulatory Visit | Attending: Family Medicine | Admitting: Family Medicine

## 2017-05-15 DIAGNOSIS — R200 Anesthesia of skin: Secondary | ICD-10-CM | POA: Diagnosis present

## 2017-05-15 DIAGNOSIS — M19072 Primary osteoarthritis, left ankle and foot: Secondary | ICD-10-CM | POA: Diagnosis not present

## 2017-05-25 ENCOUNTER — Institutional Professional Consult (permissible substitution): Payer: 59 | Admitting: Neurology

## 2017-06-01 ENCOUNTER — Encounter: Payer: Self-pay | Admitting: Neurology

## 2017-06-02 ENCOUNTER — Ambulatory Visit (INDEPENDENT_AMBULATORY_CARE_PROVIDER_SITE_OTHER): Payer: 59 | Admitting: Neurology

## 2017-06-02 ENCOUNTER — Encounter: Payer: Self-pay | Admitting: Neurology

## 2017-06-02 VITALS — BP 126/85 | HR 77 | Ht 74.0 in | Wt 227.0 lb

## 2017-06-02 DIAGNOSIS — J449 Chronic obstructive pulmonary disease, unspecified: Secondary | ICD-10-CM

## 2017-06-02 DIAGNOSIS — G4733 Obstructive sleep apnea (adult) (pediatric): Secondary | ICD-10-CM

## 2017-06-02 DIAGNOSIS — J4521 Mild intermittent asthma with (acute) exacerbation: Secondary | ICD-10-CM | POA: Diagnosis not present

## 2017-06-02 DIAGNOSIS — G4761 Periodic limb movement disorder: Secondary | ICD-10-CM | POA: Diagnosis not present

## 2017-06-02 DIAGNOSIS — J441 Chronic obstructive pulmonary disease with (acute) exacerbation: Secondary | ICD-10-CM | POA: Diagnosis not present

## 2017-06-02 DIAGNOSIS — R351 Nocturia: Secondary | ICD-10-CM

## 2017-06-02 NOTE — Progress Notes (Addendum)
SLEEP MEDICINE CLINIC   Provider:  Larey Seat, [place] D  Primary Care Physician:  [name], MD   Referring Provider: [name], MD    Chief Complaint  Patient presents with  . New Patient (Initial Visit)    Patient d/w sleep apnea in the past.     HPI:  [name] is a 61 y.o. male , seen here as in a referral   from Dr. Karie [name] for sleep apnea. [name] medical history is positive for a lung abscess in the year 2008, a pneumothorax in 1983, bleeding hemorrhoids in 2010, surgical treatment. Shoulder surgery 2008 on  both sides- Not related to the rotator cuff. Prostate cancer with seed implants earlier this year 2018. The procedure was just done on September 20. [name] has also seen my colleague Dr. Sarina Ill.   [name] presents today mainly because of his loud snoring, which has been brought to his attention by his family and friends. He can also fall asleep anywhere anytime. He reported an anicteric of falling asleep by being out on a fishing boat in the [place]. He is fatigued.  He falls asleep watching TV and his family leaves the room- they can not hear the movies plot.    Sleep habits are as follows: Usually the patient spends the last hour before going to bed either watching TV or reading, and often falling asleep during it. He sleeps in a fetal position on his left side- on 2 pillows. The bedroom is cool, quiet and dark.  Sometimes he will stay put in his den for the whole night. He will usually transfer to the bedroom before midnight, and has no trouble falling asleep in his bed again. Since he had the prostate, the treatment he developed nocturia,goes now to the bathroom for about 3-4 times each night. This was not the case prior. Usually nocturia happens every 2 hours.Occasionally he will also wake himself choking or short of air, and with the feeling of an extremely dry mouth. His snoring has been described as thunderous, and his  breathing is irregular. His wife has noted him always moving his legs at night- a condition that was already known in childhood. Has not kept him from sleeping.  He typically rises at 4 or 5 AM in the morning, waking up spontaneously without alarm.   Sleep medical history and family sleep history: See above,recent prostate cancer treatments have affected patient's sleep. He has never had tonsilloadenoidectomy's, sinus surgery, traumatic brain injuries, concussion or contusion. He is not sure if he ever slept walked as a child or if he had night terrors, no enuresis. His parents were not snoring- no siblings with OSA, 2 brothers, 1 deceased, and 2 half sibling through paternal side.     Social history: Mr. Wildeman is an ex- smoker, Quit a 2 pack per day habit in April 2017, he drinks 2 standard ETOH drinks daily, he drinks daily caffeine,   3 cups of coffee or more and energy drinks.  Review of Systems: Out of a complete 14 system review, the patient complains of only the following symptoms, and all other reviewed systems are negative.  [name] reports shortness of breath, loud snoring, trouble swallowing at times, daytime fatigue and excessive daytime sleepiness, struggling to stay awake when not physically active or mentally stimulated. Allergies with respiratory manifestation, leg cramping, numbness in the left leg, at times restless legs. He reports drooling at night, sleeps with his mouth  wide open.  Coughing, wheezing, COPD exacerbation.   Epworth score How likely are you to doze in the following situations: 0 = not likely, 1 = slight chance, 2 = moderate chance, 3 = high chance  Sitting and Reading?3 Watching Television?3 Sitting inactive in a public place (theater or meeting)?2 Lying down in the afternoon when circumstances permit?3 Sitting and talking to someone?3 Sitting quietly after lunch without alcohol?1 In a car, while stopped for a few minutes in traffic?2 As a passenger in  a car for an hour without a break?2  Total = 19     , Fatigue severity score 41  , depression score 1/15. [name] endorsed ongoing leg movements, but these do not keep him from sleeping.    Social History   Social History  . Marital status: Married    Spouse name: N/A  . Number of children: N/A  . Years of education: N/A   Occupational History  . Not on file.   Social History Main Topics  . Smoking status: Former Smoker    Packs/day: 1.00    Years: 30.00    Types: Cigarettes    Quit date: 11/03/2015  . Smokeless tobacco: Never Used  . Alcohol use 12.6 oz/week    21 Shots of liquor per week     Comment: 3 drinks daily  . Drug use: No  . Sexual activity: Not on file   Other Topics Concern  . Not on file   Social History Narrative  . No narrative on file    Family History  Problem Relation Age of Onset  . Cancer Neg Hx   . Colon cancer Neg Hx   . Colon polyps Neg Hx   . Neuropathy Neg Hx     Past Medical History:  Diagnosis Date  . Bilateral shoulder bursitis   . COPD (chronic obstructive pulmonary disease) with emphysema ([place])   . DDD (degenerative disc disease), lumbar    hx epideral injection  L5--S1   . Dysphasia    intermittant w/ food   . Dyspnea on exertion   . GERD (gastroesophageal reflux disease)   . Hiatal hernia   . History of acute pancreatitis    alcoholic pancreatitis [id number] and 01-30-2012  . History of colon polyps    10-17-2005  hyperplastic polyp's  . History of esophageal stricture    s/p  dilation 02-02-2017  . History of peptic ulcer 1990s  . History of pneumococcal pneumonia 2006   bilateral pneumonia w/ left lower lobe abscess treated w/ chest tube with suction for drainage  . History of pneumothorax    1984--  left spontaneous pneumothorax ,  treated w/ chest tube  . Hypertension   . Malignant neoplasm of prostate [facility]) urologist-  dr dahlstedt/  oncologist -- dr Tammi Klippel   dx 12-02-2016-- Stage T2b, Gleason 3+4,  PSA  5.99,  vol 25cc  . Numbness of left foot    outer left ankle numb-- per pt has appt. w/ neurologist  . OSA (obstructive sleep apnea)    per pt has not used cpap over year ago from 04-16-2017--- per study 01-17-2010  severe osa  . Wears glasses     Past Surgical History:  Procedure Laterality Date  . BIOPSY  02/02/2017   Procedure: BIOPSY;  Surgeon: Daneil Dolin, MD;  [facility];  Service: Endoscopy;;  duodenal bx's, esophgeal bx's  . CARDIOVASCULAR STRESS TEST  09/12/2009   normal nuclear perfusion study w/ no ischemia /  normal LV function and wall motion , ef 57%  . COLONOSCOPY  2007   Dr. Gala Romney: hyperplastic polyps, internal hemorrhoids   . ESOPHAGOGASTRODUODENOSCOPY (EGD) WITH PROPOFOL N/A 02/02/2017   Procedure: ESOPHAGOGASTRODUODENOSCOPY (EGD) WITH PROPOFOL;  Surgeon: Daneil Dolin, MD;  [facility];  Service: Endoscopy;  Laterality: N/A;  2:00pm  . HEMORRHOID SURGERY  01-21-2008    Forestine Na  . MALONEY DILATION N/A 02/02/2017   Procedure: Venia Minks DILATION;  Surgeon: Daneil Dolin, MD;  [facility];  Service: Endoscopy;  Laterality: N/A;  . RADIOACTIVE SEED IMPLANT N/A 04/23/2017   Procedure: RADIOACTIVE SEED IMPLANT/BRACHYTHERAPY IMPLANT;  Surgeon: Franchot Gallo, MD;  Location[facility];  Service: Urology;  Laterality: N/A;  . SPACE OAR INSTILLATION N/A 04/23/2017   Procedure: SPACE OAR INSTILLATION;  Surgeon: Franchot Gallo, MD;  Location: [facility];  Service: Urology;  Laterality: N/A;  . TRANSTHORACIC ECHOCARDIOGRAM  03/28/2009   mild LVH, ef 55-60%/ mild aorta calcification  . VIDEO ASSISTED THORACOSCOPY (VATS)/DECORTICATION Left     Current Outpatient Prescriptions  Medication Sig Dispense Refill  . ibuprofen (ADVIL,MOTRIN) 200 MG tablet Take 400 mg by mouth daily as needed for mild pain.    Marland Kitchen ipratropium-albuterol (DUONEB) 0.5-2.5 (3) MG/3ML SOLN Take 3 mLs by nebulization every 6 (six)  hours as needed for shortness of breath.    . magnesium oxide (MAG-OX) 400 MG tablet Take 400 mg by mouth daily.    . pantoprazole (PROTONIX) 40 MG tablet Take 1 tablet (40 mg total) by mouth daily. 30 minutes before breakfast 90 tablet 3  . potassium chloride SA (K-DUR,KLOR-CON) 20 MEQ tablet Take 20 mEq by mouth daily.    . SYMBICORT 160-4.5 MCG/ACT inhaler INHALE 2 PUFFS PO BID FOR COPD  11   No current facility-administered medications for this visit.     Allergies as of 06/02/2017  . (No Known Allergies)    Vitals: BP 126/85   Pulse 77   Ht 6\' 2"  (1.88 m)   Wt 227 lb (103 kg)   BMI 29.15 kg/m  Last Weight:  Wt Readings from Last 1 Encounters:  06/02/17 227 lb (103 kg)   RSW:NIOE mass index is 29.15 kg/m.     Last Height:   Ht Readings from Last 1 Encounters:  06/02/17 6\' 2"  (1.88 m)    Physical exam:  General: The patient is awake, alert and appears not in acute distress. The patient is well groomed. Head: Normocephalic, atraumatic. Neck is supple. Mallampati 5,  neck circumference: 18.25 . Nasal airflow congested , a TMJ click  is not evident . Retrognathia is seen.  Cardiovascular:  Regular rate and rhythm , without  murmurs or carotid bruit, and without distended neck veins. Respiratory: wheezing, rhonci-  Lungs are not clear to auscultation. Skin:  Without evidence of edema, or rash-  Trunk: BMI is 29.15. Not morbidly obese. The patient's posture is erect.   Neurologic exam : The patient is awake and alert, oriented to place and time.  Attention span & concentration ability appears normal.  Speech is fluent,  without dysarthria, dysphonia or aphasia.  Mood and affect are appropriate.  Cranial nerves: Pupils are equal and briskly reactive to light. Extraocular movements  in vertical and horizontal planes intact and without nystagmus. Visual fields by finger perimetry are intact. Hearing to finger rub intact.  Facial sensation intact to fine touch.  Facial motor  strength is symmetric and tongue and uvula move midline. Shoulder  shrug was symmetrical.   Motor exam:   Normal tone, muscle bulk and symmetric strength in all extremities. Sensory:  See Dr. Cathren Laine note- swollen and numb left foot , outside ankle / leg/. Fine touch, pinprick and vibration were tested in all extremities.  Coordination: Rapid alternating movements in the fingers/hands was normal. Finger-to-nose maneuver  normal without evidence of ataxia, dysmetria or tremor. Gait and station: Patient walks without assistive device and is able unassisted to climb up to the exam table. Strength within normal limits.  Stance is stable and normal. Tandem gait is unfragmented. Turns with 3 Steps. Romberg testing is  negative.  Deep tendon reflexes: in the  upper and lower extremities are symmetric and intact.    Dr Ferdinand Lango note from last month . HPI:  KADEEN SROKA is a 61 y.o. male here as a referral from Dr. Karie [name] for ankle pain. Past medical history of continued ankle pain, localized edema, overweight, venous insufficiency chronic peripheral made by vascular specialist, history of nicotine dependence, COPD. The left ankle stays numb, started about a year ago. He has PVD and discoloration of the skin. He has been wearing compression stocking and sometimes it gets very tight with swelling. He has significant back pain, a ruptured disk on L5/S1 and it shoots down the left leg to the foot. He stretches religiously. He has no weakness of the left leg, the right leg is unaffected. The swelling started about a year ago. The back pain has been chronic. No injuries to the foot, slowly progressive numbness and swelling. There is som epain on movement on inversion and occ burning.  Assessment:  After physical and neurologic examination, review of laboratory studies,  Personal review of imaging studies, reports of other /same  Imaging studies, results of polysomnography and / or neurophysiology testing and  pre-existing records as far as provided in visit., my assessment is   1) Mr. Jacome has been witnessed to have thunderous snoring and irregular breathing at night, undoubtedly this indicates obstructive sleep apnea. He also has gained weight just over a recent period of time. He has developed excessive daytime sleepiness and daytime fatigue in response.  2) Mr. Wanda Plump. developed nocturia Since late September, following a seed implant treatment for prostate cancer. Nocturia has been so frequent that it has fragmented his sleep further and would qualify as a second sleep disorder.  3)Mr.Hewes also has restless legs has been restless, it is hard for him to keep his leg still, And these involuntary movements continue throughout the night as has been witnessed by his parents and now by his wife. This has been a lifelong condition and has not kept him from getting restful sleep. He does not have restless legs in the sense of the irresistible urge to move keeping him from falling asleep. He can initiate sleep easily.  4)he has COPD with a recent exacerbation, cough and bronchitis. Not clear to ausculation. He reports that his coughing is also triggered by smells such as cologne or air freshener, and pollen, gras and smoke.   The patient was advised of the nature of the diagnosed disorder , the treatment options and the  risks for general health and wellness arising from not treating the condition.   I spent more than 50 minutes of face to face time with the patient.  Greater than 50% of time was spent in counseling and coordination of care. We have discussed the diagnosis and differential and I answered the patient's questions.  Plan:  Treatment plan and additional workup :  I will order a attended sleep study for this patient given that he has 3 distinct conditions and that 2 of them cannot be obtained or observed in a home sleep test setting. I needs a split-night polysomnography which also allows me to  qualify his periodic limb movements, and I would like for him to have a bedside urinal or en suite bathroom. He has an exacerbation of COPD with bronchitis right now, wheezing and rhonci- I  consider OSA with COPD overlap a condition precluding a HST.    Rv after split night -   Larey Seat, MD 40/[id number]/3532, 99:24 AM  Certified in Neurology by ABPN Certified in [place] by [facility] [address] [place], [place] [address]

## 2017-06-02 NOTE — Patient Instructions (Signed)

## 2017-06-04 ENCOUNTER — Encounter: Payer: Self-pay | Admitting: Radiation Oncology

## 2017-06-04 DIAGNOSIS — C61 Malignant neoplasm of prostate: Secondary | ICD-10-CM | POA: Diagnosis not present

## 2017-06-05 NOTE — Progress Notes (Signed)
  Radiation Oncology         [phone]) [phone] ________________________________  Name: SANJAY BROADFOOT MRN: [id number]  Date: 06/04/2017  DOB: November 19, 1955  3D Planning Note   Prostate Brachytherapy Post-Implant Dosimetry  Diagnosis: 61 y.o. gentleman with Stage T2b adenocarcinoma of the prostate with Gleason Score of 3+4, and PSA of 5.99  Narrative: On a previous date, DAYMEIN NUNNERY returned following prostate seed implantation for post implant planning. He underwent CT scan complex simulation to delineate the three-dimensional structures of the pelvis and demonstrate the radiation distribution.  Since that time, the seed localization, and complex isodose planning with dose volume histograms have now been completed.  Results:   Prostate Coverage - The dose of radiation delivered to the 90% or more of the prostate gland (D90) was 106.93% of the prescription dose. This exceeds our goal of greater than 90%. Rectal Sparing - The volume of rectal tissue receiving the prescription dose or higher was 0.0 cc. This falls under our thresholds tolerance of 1.0 cc.  Impression: The prostate seed implant appears to show adequate target coverage and appropriate rectal sparing.  Plan:  The patient will continue to follow with urology for ongoing PSA determinations. I would anticipate a high likelihood for local tumor control with minimal risk for rectal morbidity.  ________________________________  Sheral Apley Tammi Klippel, M.D.

## 2017-06-08 ENCOUNTER — Telehealth: Payer: Self-pay

## 2017-06-08 ENCOUNTER — Telehealth: Payer: Self-pay | Admitting: Radiation Oncology

## 2017-06-08 NOTE — Telephone Encounter (Addendum)
Patient left telephone message requesting return call. Phoned patient back. Patient had a prostate seed implant on 04/27/17 and questions when he can have a routine screening colonoscopy. Per Dr. Tammi Klippel informed patient that if the colonoscopy is for bleeding he can have it anytime but if its for routine screening he should wait six months from implant date. Patient verbalized understanding and expressed appreciation for the return call.

## 2017-06-08 NOTE — Telephone Encounter (Signed)
Pt had cancelled appt on 11/7/ 2018 and was rescheduled for 07/20/2017. He was trying to coordinate a later time in afternoon. However, he was hoping to get procedures ( EGD and TCS) before the end of the year. I told him I could not promise him if appt on 07/20/2017 that there would still be slots for procedures. He took the appt back on 06/10/2017 at 3:00 pm with Neil Crouch, PA. He is going to speak with the Oncologist to make sure OK to have GI procedures now. He will let us know, and if need be, he will call and cancel apppt for Wed.

## 2017-06-09 NOTE — Telephone Encounter (Signed)
PT called back today. Said he spoke with Oncologist yesterday. Oncologist does not recommend having the colonoscopy until 6 months after the Radioactive seed implant for his prostate ( which was done 04/23/2017). Oncologist told him it was OK to do the upper endoscopy and pt is having problems and would like to keep appt on 06/10/2017 at 3:00 pm with Neil Crouch, PA to get scheduled.  Sending FYI to Neil Crouch, PA who will be seeing the pt.

## 2017-06-09 NOTE — Telephone Encounter (Signed)
Agree if he wants to proceed with EGD/ED sooner than his TCS because of swallowing problems we can do that.

## 2017-06-10 ENCOUNTER — Ambulatory Visit: Payer: 59 | Admitting: Gastroenterology

## 2017-06-10 ENCOUNTER — Ambulatory Visit (INDEPENDENT_AMBULATORY_CARE_PROVIDER_SITE_OTHER): Payer: 59 | Admitting: Gastroenterology

## 2017-06-10 ENCOUNTER — Encounter: Payer: Self-pay | Admitting: Gastroenterology

## 2017-06-10 VITALS — BP 170/90 | HR 91 | Temp 97.1°F | Ht 74.0 in | Wt 225.0 lb

## 2017-06-10 DIAGNOSIS — R1310 Dysphagia, unspecified: Secondary | ICD-10-CM

## 2017-06-10 DIAGNOSIS — K2100 Gastro-esophageal reflux disease with esophagitis, without bleeding: Secondary | ICD-10-CM

## 2017-06-10 DIAGNOSIS — R1319 Other dysphagia: Secondary | ICD-10-CM

## 2017-06-10 NOTE — Patient Instructions (Signed)
1. Upper endoscopy as scheduled.  Please see separate instructions. 2. Monitor your blood pressure at home, let Dr. Karie Kirks know if readings remain elevated.

## 2017-06-10 NOTE — Progress Notes (Signed)
Please send a letter to the patient in March 2019 reminding him of need for screening colonoscopy.

## 2017-06-10 NOTE — Assessment & Plan Note (Signed)
Continues with intermittent dysphasia to solid foods and liquids.  Recent food impaction requiring vomiting to relieve.  Likely needs further esophageal dilation.  Last EGD with dilation in July as outlined.  Plan on EGD with dilation with deep sedation in the OR.  I have discussed the risks, alternatives, benefits with regards to but not limited to the risk of reaction to medication, bleeding, infection, perforation and the patient is agreeable to proceed. Written consent to be obtained.  As discussed, patient will postpone screening colonoscopy until 6 months after prostate seed implantation, would be ready around March 2019.

## 2017-06-10 NOTE — Progress Notes (Addendum)
Primary Care Physician:  [name], MD  Primary Gastroenterologist:  Garfield Cornea, MD   Chief Complaint  Patient presents with  . Dysphagia    HPI:  [name] is a 61 y.o. male here for follow-up of esophageal dysphasia.  I saw him back in September.Patient had an EGD on July 2 showing mucosal changes in the lower third of the esophagus, some linear crpe paper appearing mucosa, narrowing of the GE junction. Small hiatal hernia. Diffuse mucosal changes found in the first portion of the duodenum. Patient's esophagus was dilated with 37 [place]. Duodenal biopsies were unremarkable. Esophageal biopsy showed no evidence of significant inflammation dysplasia or malignancy.  When I last saw him he would continue to have episodes of dysphagia to solid foods as well as liquids.  Felt like he needed his esophagus stretched again but wanted to postpone due to undergoing treatment for prostate cancer.  He is due for screening colonoscopy but advised him to wait L after prostate treatment completed.  If he underwent radioactive seed implants placed September 20 and has been advised by his ideation oncologist to wait for 6 months prior to "screening" colonoscopy.  Patient presents today after having an episode of esophageal food impaction resolved with vomiting.  Continues to have difficulty with solid foods, has to be very careful.  Sometimes difficulty with liquids.  Typical heartburn well controlled.  Wants to have his esophagus stretched.  Denies abdominal pain.  Bowel movements regular.  No blood in the stool or melena.  Continues to vape.  Blood pressure up today, rechecked 3 times and consistently around 170/90.  Patient denies use of cold medications.  Has been using some ibuprofen this week.  And cough drops for congestion.  Current Outpatient Medications  Medication Sig Dispense Refill  . ibuprofen (ADVIL,MOTRIN) 200 MG tablet Take 400 mg by mouth daily as needed for mild pain.    Marland Kitchen  ipratropium-albuterol (DUONEB) 0.5-2.5 (3) MG/3ML SOLN Take 3 mLs by nebulization every 6 (six) hours as needed for shortness of breath.    . pantoprazole (PROTONIX) 40 MG tablet Take 1 tablet (40 mg total) by mouth daily. 30 minutes before breakfast 90 tablet 3  . SYMBICORT 160-4.5 MCG/ACT inhaler INHALE 2 PUFFS PO BID FOR COPD  11   No current facility-administered medications for this visit.     Allergies as of 06/10/2017  . (No Known Allergies)    Past Medical History:  Diagnosis Date  . Bilateral shoulder bursitis   . COPD (chronic obstructive pulmonary disease) with emphysema ([place])   . DDD (degenerative disc disease), lumbar    hx epideral injection  L5--S1   . Dysphasia    intermittant w/ food   . Dyspnea on exertion   . GERD (gastroesophageal reflux disease)   . Hiatal hernia   . History of acute pancreatitis    alcoholic pancreatitis [id number] and 01-30-2012  . History of colon polyps    10-17-2005  hyperplastic polyp's  . History of esophageal stricture    s/p  dilation 02-02-2017  . History of peptic ulcer 1990s  . History of pneumococcal pneumonia 2006   bilateral pneumonia w/ left lower lobe abscess treated w/ chest tube with suction for drainage  . History of pneumothorax    1984--  left spontaneous pneumothorax ,  treated w/ chest tube  . Hypertension   . Malignant neoplasm of prostate [facility]) urologist-  dr dahlstedt/  oncologist -- dr Tammi Klippel   dx 12-02-2016-- Stage T2b, Gleason 3+4,  PSA 5.99,  vol 25cc  . Numbness of left foot    outer left ankle numb-- per pt has appt. w/ neurologist  . OSA (obstructive sleep apnea)    per pt has not used cpap over year ago from 04-16-2017--- per study 01-17-2010  severe osa  . Wears glasses     [place] BIOPSY  02/02/2017    Procedure: BIOPSY;  Surgeon: Daneil Dolin, MD;  Location: [facility];  Service: Endoscopy;;  duodenal bx's, esophgeal bx's  . CARDIOVASCULAR STRESS TEST  09/12/2009   normal nuclear perfusion  study w/ no ischemia /  normal LV function and wall motion , ef 57%  . COLONOSCOPY  2007   Dr. Gala Romney: hyperplastic polyps, internal hemorrhoids   . ESOPHAGOGASTRODUODENOSCOPY (EGD) WITH PROPOFOL N/A 02/02/2017   Procedure: ESOPHAGOGASTRODUODENOSCOPY (EGD) WITH PROPOFOL;  Surgeon: Daneil Dolin, MD;  Location: [facility];  Service: Endoscopy;  Laterality: N/A;  2:00pm  . HEMORRHOID SURGERY  01-21-2008    [facility] Na  . MALONEY DILATION N/A 02/02/2017   Procedure: Venia Minks DILATION;  Surgeon: Daneil Dolin, MD;  Location: [facility];  Service: Endoscopy;  Laterality: N/A;  . TRANSTHORACIC ECHOCARDIOGRAM  03/28/2009   mild LVH, ef 55-60%/ mild aorta calcification  . VIDEO ASSISTED THORACOSCOPY (VATS)/DECORTICATION       Family History  Problem Relation Age of Onset  . Cancer Neg Hx   . Colon cancer Neg Hx   . Colon polyps Neg Hx   . Neuropathy Neg Hx     Social History   Socioeconomic History  . Marital status: Married    Spouse name: Not on file  . Number of children: Not on file  . Years of education: Not on file  . Highest education level: Not on file  Social Needs  . Financial resource strain: Not on file  . Food insecurity - worry: Not on file  . Food insecurity - inability: Not on file  . Transportation needs - medical: Not on file  . Transportation needs - non-medical: Not on file  Occupational History  . Not on file  Tobacco Use  . Smoking status: Current Every Day Smoker    Packs/day: 1.00    Years: 30.00    Pack years: 30.00    Types: Cigarettes, E-cigarettes    Last attempt to quit: 11/03/2015    Years since quitting: 1.6  . Smokeless tobacco: Never Used  Substance and Sexual Activity  . Alcohol use: Yes    Alcohol/week: 12.6 oz    Types: 21 Shots of liquor per week    Comment: 2-3 drinks daily  . Drug use: No  . Sexual activity: Not on file  Other Topics Concern  . Not on file  Social History Narrative  . Not on file      ROS:  General:  Negative for anorexia, weight loss, fever, chills, fatigue, weakness. Eyes: Negative for vision changes.  ENT: Negative for hoarseness, difficulty swallowing , nasal congestion. CV: Negative for chest pain, angina, palpitations, dyspnea on exertion, peripheral edema.  Respiratory: Negative for dyspnea at rest, dyspnea on exertion, cough, sputum, wheezing.  GI: See history of present illness. GU:  Negative for dysuria, hematuria, urinary incontinence, urinary frequency, nocturnal urination.  MS: Negative for joint pain, low back pain.  Derm: Negative for rash or itching.  Neuro: Negative for weakness, abnormal sensation, seizure, frequent headaches, memory loss, confusion.  Psych: Negative for anxiety, depression, suicidal ideation, hallucinations.  Endo: Negative for unusual  weight change.  Heme: Negative for bruising or bleeding. Allergy: Negative for rash or hives.    Physical Examination:  BP (!) 170/90 (BP Location: Right Arm, Patient Position: Sitting, Cuff Size: Normal)   Pulse 91   Temp (!) 97.1 F (36.2 C) (Oral)   Ht 6\' 2"  (1.88 m)   Wt 225 lb (102.1 kg)   BMI 28.89 kg/m    General: Well-nourished, well-developed in no acute distress.  Head: Normocephalic, atraumatic.   Eyes: Conjunctiva pink, no icterus. Mouth: Oropharyngeal mucosa moist and pink , no lesions erythema or exudate. Neck: Supple without thyromegaly, masses, or lymphadenopathy.  Lungs: Clear to auscultation bilaterally.  Heart: Regular rate and rhythm, no murmurs rubs or gallops.  Abdomen: Bowel sounds are normal, nontender, nondistended, no hepatosplenomegaly or masses, no abdominal bruits or    hernia , no rebound or guarding.   Rectal: Not performed Extremities: No lower extremity edema. No clubbing or deformities.  Neuro: Alert and oriented x 4 , grossly normal neurologically.  Skin: Warm and dry, no rash or jaundice.   Psych: Alert and cooperative, normal mood and affect.  Labs: Lab Results   Component Value Date   CREATININE 1.00 04/20/2017   BUN 8 04/20/2017   NA 137 04/20/2017   K 4.0 04/20/2017   CL 99 (L) 04/20/2017   CO2 25 04/20/2017   Lab Results  Component Value Date   ALT 41 04/20/2017   AST 45 (H) 04/20/2017   ALKPHOS 59 04/20/2017   BILITOT 1.1 04/20/2017   Lab Results  Component Value Date   WBC 7.9 04/20/2017   HGB 15.8 04/20/2017   HCT 44.6 04/20/2017   MCV 92.7 04/20/2017   PLT 167 04/20/2017     Imaging Studies: Mr Foot Left Wo Contrast  Result Date: 05/15/2017 CLINICAL DATA:  Left ankle pain and numbness for 1 year EXAM: MRI OF THE LEFT FOOT WITHOUT CONTRAST TECHNIQUE: Multiplanar, multisequence MR imaging of the left ankle was performed. No intravenous contrast was administered. COMPARISON:  None. FINDINGS: TENDONS Peroneal: Peroneal longus tendon intact. Peroneal brevis intact. Posteromedial: Posterior tibial tendon intact. Flexor hallucis longus tendon intact. Flexor digitorum longus tendon intact. Anterior: Tibialis anterior tendon intact. Extensor hallucis longus tendon intact Extensor digitorum longus tendon intact. Achilles:  Intact. Mild edema in Kager's fat. Plantar Fascia: Intact. LIGAMENTS Lateral: Anterior talofibular ligament intact. Calcaneofibular ligament intact. Posterior talofibular ligament intact. Anterior and posterior tibiofibular ligaments intact. Medial: Deltoid ligament intact. Spring ligament intact. CARTILAGE Ankle Joint: No joint effusion. Normal ankle mortise. No chondral defect. Subtalar Joints/Sinus Tarsi: Normal subtalar joints. No subtalar joint effusion. Normal sinus tarsi. Bones: Mild osteoarthritis of the talonavicular joint. No marrow signal abnormality. No fracture or dislocation. Soft Tissue: No soft tissue mass or fluid collection. IMPRESSION: 1. No acute injury of the left ankle. 2. Mild osteoarthritis of the talonavicular joint. Electronically Signed   By: Kathreen Devoid   On: 05/15/2017 10:19

## 2017-06-10 NOTE — Progress Notes (Signed)
cc'ed to pcp °

## 2017-06-11 ENCOUNTER — Telehealth: Payer: Self-pay

## 2017-06-11 ENCOUNTER — Other Ambulatory Visit: Payer: Self-pay

## 2017-06-11 DIAGNOSIS — K2100 Gastro-esophageal reflux disease with esophagitis, without bleeding: Secondary | ICD-10-CM

## 2017-06-11 DIAGNOSIS — R1319 Other dysphagia: Secondary | ICD-10-CM

## 2017-06-11 DIAGNOSIS — R1310 Dysphagia, unspecified: Secondary | ICD-10-CM

## 2017-06-11 NOTE — Patient Instructions (Signed)
PA info for EGD/Dil submitted via [facility] website. Case approved. PA# [id number].

## 2017-06-11 NOTE — Telephone Encounter (Signed)
Called and informed pt of pre-op appt 07/24/17 at 11:00am. Letter mailed.

## 2017-06-16 ENCOUNTER — Telehealth: Payer: Self-pay

## 2017-06-16 DIAGNOSIS — R0602 Shortness of breath: Secondary | ICD-10-CM

## 2017-06-16 DIAGNOSIS — J930 Spontaneous tension pneumothorax: Secondary | ICD-10-CM

## 2017-06-16 DIAGNOSIS — F1020 Alcohol dependence, uncomplicated: Secondary | ICD-10-CM

## 2017-06-16 DIAGNOSIS — Z720 Tobacco use: Secondary | ICD-10-CM

## 2017-06-16 DIAGNOSIS — D696 Thrombocytopenia, unspecified: Secondary | ICD-10-CM

## 2017-06-16 NOTE — Telephone Encounter (Signed)
[employer] denied in-lab study. Need HST order

## 2017-06-22 ENCOUNTER — Other Ambulatory Visit: Payer: Self-pay

## 2017-06-22 ENCOUNTER — Encounter (HOSPITAL_COMMUNITY): Payer: Self-pay | Admitting: Emergency Medicine

## 2017-06-22 DIAGNOSIS — J449 Chronic obstructive pulmonary disease, unspecified: Secondary | ICD-10-CM | POA: Diagnosis not present

## 2017-06-22 DIAGNOSIS — F17210 Nicotine dependence, cigarettes, uncomplicated: Secondary | ICD-10-CM | POA: Diagnosis not present

## 2017-06-22 DIAGNOSIS — L509 Urticaria, unspecified: Secondary | ICD-10-CM | POA: Insufficient documentation

## 2017-06-22 DIAGNOSIS — I10 Essential (primary) hypertension: Secondary | ICD-10-CM | POA: Diagnosis not present

## 2017-06-22 DIAGNOSIS — Z79899 Other long term (current) drug therapy: Secondary | ICD-10-CM | POA: Insufficient documentation

## 2017-06-22 DIAGNOSIS — Z8546 Personal history of malignant neoplasm of prostate: Secondary | ICD-10-CM | POA: Insufficient documentation

## 2017-06-22 DIAGNOSIS — R21 Rash and other nonspecific skin eruption: Secondary | ICD-10-CM | POA: Diagnosis present

## 2017-06-22 NOTE — ED Triage Notes (Signed)
Hives on arms and legs started around 3 pm today.  Does not  recall anything different he has used or eaten.

## 2017-06-23 ENCOUNTER — Emergency Department (HOSPITAL_COMMUNITY)
Admission: EM | Admit: 2017-06-23 | Discharge: 2017-06-23 | Disposition: A | Discharge status: Home / Self Care | Payer: 59 | Attending: Emergency Medicine | Admitting: Emergency Medicine

## 2017-06-23 DIAGNOSIS — L509 Urticaria, unspecified: Secondary | ICD-10-CM

## 2017-06-23 LAB — COMPREHENSIVE METABOLIC PANEL
ALK PHOS: 52 U/L (ref 38–126)
ALT: 31 U/L (ref 17–63)
ANION GAP: 9 (ref 5–15)
AST: 31 U/L (ref 15–41)
Albumin: 3.9 g/dL (ref 3.5–5.0)
BUN: 13 mg/dL (ref 6–20)
CHLORIDE: 107 mmol/L (ref 101–111)
CO2: 25 mmol/L (ref 22–32)
CREATININE: 0.8 mg/dL (ref 0.61–1.24)
Calcium: 9.6 mg/dL (ref 8.9–10.3)
GFR calc non Af Amer: >60 mL/min (ref >60)
Glucose, Bld: 99 mg/dL (ref 65–99)
Potassium: 4 mmol/L (ref 3.5–5.1)
SODIUM: 141 mmol/L (ref 135–145)
TOTAL PROTEIN: 7.4 g/dL (ref 6.5–8.1)
Total Bilirubin: 0.8 mg/dL (ref 0.3–1.2)

## 2017-06-23 LAB — CBC WITH DIFFERENTIAL/PLATELET
Basophils Absolute: 0 10*3/uL (ref 0.0–0.1)
Basophils Relative: 1 %
EOS ABS: 0.4 10*3/uL (ref 0.0–0.7)
EOS PCT: 6 %
HCT: 45.9 % (ref 39.0–52.0)
Hemoglobin: 15.1 g/dL (ref 13.0–17.0)
LYMPHS ABS: 2.4 10*3/uL (ref 0.7–4.0)
Lymphocytes Relative: 37 %
MCH: 31.9 pg (ref 26.0–34.0)
MCHC: 32.9 g/dL (ref 30.0–36.0)
MCV: 96.8 fL (ref 78.0–100.0)
MONOS PCT: 13 %
Monocytes Absolute: 0.9 10*3/uL (ref 0.1–1.0)
Neutro Abs: 2.9 10*3/uL (ref 1.7–7.7)
Neutrophils Relative %: 43 %
PLATELETS: 165 10*3/uL (ref 150–400)
RBC: 4.74 MIL/uL (ref 4.22–5.81)
RDW: 13.3 % (ref 11.5–15.5)
WBC: 6.7 10*3/uL (ref 4.0–10.5)

## 2017-06-23 MED ORDER — DIPHENHYDRAMINE HCL 25 MG PO TABS: 25.0000 mg | ORAL_TABLET | Freq: Four times a day (QID) | ORAL | 0 refills | Status: DC

## 2017-06-23 MED ORDER — DIPHENHYDRAMINE HCL 25 MG PO CAPS
25.0000 mg | ORAL_CAPSULE | Freq: Once | ORAL | Status: AC
Start: 2017-06-23 — End: 2017-06-23
  Administered 2017-06-23: 25 mg via ORAL
  Filled 2017-06-23: qty 1

## 2017-06-23 MED ORDER — DIPHENHYDRAMINE HCL 50 MG/ML IJ SOLN
25.0000 mg | Freq: Once | INTRAMUSCULAR | Status: AC
  Administered 2017-06-23: 25 mg via INTRAMUSCULAR
  Filled 2017-06-23: qty 1

## 2017-06-23 MED ORDER — PREDNISONE 50 MG PO TABS: ORAL_TABLET | ORAL | 0 refills | Status: DC

## 2017-06-23 MED ORDER — PREDNISONE 10 MG PO TABS
60.0000 mg | ORAL_TABLET | Freq: Once | ORAL | Status: AC
  Administered 2017-06-23: 60 mg via ORAL
  Filled 2017-06-23: qty 1

## 2017-06-23 MED ORDER — FAMOTIDINE 20 MG PO TABS: 20.0000 mg | ORAL_TABLET | Freq: Two times a day (BID) | ORAL | 0 refills | Status: DC

## 2017-06-23 NOTE — ED Provider Notes (Signed)
[facility] EMERGENCY DEPARTMENT Provider Note   CSN: [id number] Arrival date & time: 06/22/17  2148     History   Chief Complaint No chief complaint on file.   HPI [name] is a 61 y.o. male.  Patient with history of COPD presenting with itchy red rash started to his right leg around 3 PM.  This then progressed to his bilateral arms legs and trunk.  Did not take anything for it at home.  Denies any new exposures.  No difficulty breathing or difficulty swallowing.  No chest pain or shortness of breath.  No recent tick bites.  No new clothes, lotions, detergents, medications or foods.  He is feeling improved at this time though he still has some itchy rash persist into his abdomen and right arm.  He does not know of any allergies.  The only medication he takes on a regular basis his Symbicort which he did not take today.  Does not use ACE inhibitors.   The history is provided by the patient.    Past Medical History:  Diagnosis Date  . Bilateral shoulder bursitis   . COPD (chronic obstructive pulmonary disease) with emphysema ([place])   . DDD (degenerative disc disease), lumbar    hx epideral injection  L5--S1   . Dysphasia    intermittant w/ food   . Dyspnea on exertion   . GERD (gastroesophageal reflux disease)   . Hiatal hernia   . History of acute pancreatitis    alcoholic pancreatitis [id number] and 01-30-2012  . History of colon polyps    10-17-2005  hyperplastic polyp's  . History of esophageal stricture    s/p  dilation 02-02-2017  . History of peptic ulcer 1990s  . History of pneumococcal pneumonia 2006   bilateral pneumonia w/ left lower lobe abscess treated w/ chest tube with suction for drainage  . History of pneumothorax    1984--  left spontaneous pneumothorax ,  treated w/ chest tube  . Hypertension   . Malignant neoplasm of prostate [facility]) urologist-  dr dahlstedt/  oncologist -- dr Tammi Klippel   dx 12-02-2016-- Stage T2b, Gleason 3+4,  PSA 5.99,  vol  25cc  . Numbness of left foot    outer left ankle numb-- per pt has appt. w/ neurologist  . OSA (obstructive sleep apnea)    per pt has not used cpap over year ago from 04-16-2017--- per study 01-17-2010  severe osa  . Wears glasses     Patient Active Problem List   Diagnosis Date Noted  . GERD (gastroesophageal reflux disease) 04/07/2017  . Dysphagia 01/29/2017  . Malignant neoplasm of prostate ([place]) 01/19/2017  . Pancreatitis, alcoholic 01/75/1025  . Alcoholism ([place]) 11/11/2011  . Tobacco abuse 11/11/2011  . Thrombocytopenia ([place]) 11/11/2011  . HYPERSOMNIA, ASSOCIATED WITH SLEEP APNEA 09/27/2009  . COPD 09/07/2009  . Pneumothorax 09/07/2009  . BURSITIS 09/07/2009  . FATIGUE / MALAISE 09/07/2009  . SHORTNESS OF BREATH 09/07/2009  . PNEUMOTHORAX 09/07/2009    Past Surgical History:  Procedure Laterality Date  . BIOPSY  02/02/2017   Performed by Daneil Dolin, MD at [place]  . CARDIOVASCULAR STRESS TEST  09/12/2009   normal nuclear perfusion study w/ no ischemia /  normal LV function and wall motion , ef 57%  . COLONOSCOPY  2007   Dr. Gala Romney: hyperplastic polyps, internal hemorrhoids   . ESOPHAGOGASTRODUODENOSCOPY (EGD) WITH PROPOFOL N/A 02/02/2017   Performed by Daneil Dolin, MD at [place]  .  HEMORRHOID SURGERY  01-21-2008    [facility]  . MALONEY DILATION N/A 02/02/2017   Performed by Daneil Dolin, MD at [place]  . RADIOACTIVE SEED IMPLANT/BRACHYTHERAPY IMPLANT N/A 04/23/2017   Performed by Franchot Gallo, MD at [facility]  . SPACE OAR INSTILLATION N/A 04/23/2017   Performed by Franchot Gallo, MD at [facility]  . TRANSTHORACIC ECHOCARDIOGRAM  03/28/2009   mild LVH, ef 55-60%/ mild aorta calcification  . VIDEO ASSISTED THORACOSCOPY (VATS)/DECORTICATION Left        Home Medications    Prior to Admission medications   Medication Sig Start Date End Date Taking? Authorizing Provider  ibuprofen (ADVIL,MOTRIN)  200 MG tablet Take 400 mg by mouth daily as needed for mild pain.    [provider]  ipratropium-albuterol (DUONEB) 0.5-2.5 (3) MG/3ML SOLN Take 3 mLs by nebulization every 6 (six) hours as needed for shortness of breath. 01/26/17   [provider]  pantoprazole (PROTONIX) 40 MG tablet Take 1 tablet (40 mg total) by mouth daily. 30 minutes before breakfast 01/29/17   Annitta Needs, NP  SYMBICORT 160-4.5 MCG/ACT inhaler INHALE 2 PUFFS PO BID FOR COPD 03/07/17   [provider]    Family History Family History  Problem Relation Age of Onset  . Cancer Neg Hx   . Colon cancer Neg Hx   . Colon polyps Neg Hx   . Neuropathy Neg Hx     Social History Social History   Tobacco Use  . Smoking status: Current Every Day Smoker    Packs/day: 1.00    Years: 30.00    Pack years: 30.00    Types: Cigarettes, E-cigarettes    Last attempt to quit: 11/03/2015    Years since quitting: 1.6  . Smokeless tobacco: Never Used  Substance Use Topics  . Alcohol use: Yes    Alcohol/week: 12.6 oz    Types: 21 Shots of liquor per week    Comment: 2-3 drinks daily  . Drug use: No     Allergies   Patient has no known allergies.   Review of Systems Review of Systems  Constitutional: Negative for activity change, appetite change and fever.  HENT: Negative for congestion and rhinorrhea.   Eyes: Negative for visual disturbance.  Respiratory: Negative for cough, chest tightness and shortness of breath.   Gastrointestinal: Negative for abdominal pain, nausea and vomiting.  Genitourinary: Negative for dysuria, scrotal swelling and testicular pain.  Musculoskeletal: Negative for arthralgias and myalgias.  Skin: Positive for rash.  Neurological: Negative for dizziness, weakness and headaches.    all other systems are negative except as noted in the HPI and PMH.    Physical Exam Updated Vital Signs BP (!) 143/88 (BP Location: Right Arm)   Pulse 98   Temp 97.7 F (36.5 C) (Oral)    Resp 18   Ht 6\' 2"  (1.88 m)   Wt 102.1 kg (225 lb)   SpO2 98%   BMI 28.89 kg/m   Physical Exam  Constitutional: He is oriented to person, place, and time. He appears well-developed and well-nourished. No distress.  HENT:  Head: Normocephalic and atraumatic.  Mouth/Throat: Oropharynx is clear and moist. No oropharyngeal exudate.  No tongue swelling or lip swelling  Eyes: Conjunctivae and EOM are normal. Pupils are equal, round, and reactive to light.  Neck: Normal range of motion. Neck supple.  No meningismus.  Cardiovascular: Normal rate, regular rhythm, normal heart sounds and intact distal pulses.  No murmur heard. Pulmonary/Chest: Effort normal and breath sounds normal. No respiratory distress.  Abdominal: Soft. There is no tenderness. There is no rebound and no guarding.  Musculoskeletal: Normal range of motion. He exhibits no edema or tenderness.  Neurological: He is alert and oriented to person, place, and time. No cranial nerve deficit. He exhibits normal muscle tone. Coordination normal.  No ataxia on finger to nose bilaterally. No pronator drift. 5/5 strength throughout. CN 2-12 intact.Equal grip strength. Sensation intact.   Skin: Skin is warm. Rash noted.  Scattered erythematous macular papular rash involving bilateral forearms, lower abdomen, back and right upper leg  Psychiatric: He has a normal mood and affect. His behavior is normal.  Nursing note and vitals reviewed.    ED Treatments / Results  Labs (all labs ordered are listed, but only abnormal results are displayed) Labs Reviewed  CBC WITH DIFFERENTIAL/PLATELET  COMPREHENSIVE METABOLIC PANEL    EKG  EKG Interpretation None       Radiology No results found.  Procedures Procedures (including critical care time)  Medications Ordered in ED Medications  diphenhydrAMINE (BENADRYL) capsule 25 mg (25 mg Oral Given 06/23/17 0041)  predniSONE (DELTASONE) tablet 60 mg (60 mg Oral Given 06/23/17 0041)      Initial Impression / Assessment and Plan / ED Course  I have reviewed the triage vital signs and the nursing notes.  Pertinent labs & imaging results that were available during my care of the patient were reviewed by me and considered in my medical decision making (see chart for details).     Patient with urticarial rash involving extremities and trunk.  No tongue or lip swelling.  No difficulty breathing or swallowing.  Denies any new exposures.  Patient given antihistamines and steroids.  He is observed in the ED with no deterioration in condition.  His lungs are clear.  There is no tongue or lip swelling.  Patient with improvement in his urticaria and is requesting discharge. He will be given a course of prednisone as well as as needed antihistamines.  Follow-up with PCP.  Return precautions discussed including difficulty breathing, difficult to swallow.  The concerns.  Final Clinical Impressions(s) / ED Diagnoses   Final diagnoses:  Urticaria    ED Discharge Orders    None       Keyonte Cookston, Annie Main, MD 06/23/17 [phone]

## 2017-06-23 NOTE — Discharge Instructions (Signed)
Take the steroids as prescribed.  Use antihistamines as needed for itching.  Follow-up with your doctor.  Return to the ED if you develop difficulty breathing, chest pain or any other concerns.

## 2017-06-29 ENCOUNTER — Ambulatory Visit (INDEPENDENT_AMBULATORY_CARE_PROVIDER_SITE_OTHER): Payer: 59 | Admitting: Neurology

## 2017-06-29 DIAGNOSIS — D696 Thrombocytopenia, unspecified: Secondary | ICD-10-CM

## 2017-06-29 DIAGNOSIS — R0602 Shortness of breath: Secondary | ICD-10-CM

## 2017-06-29 DIAGNOSIS — G4733 Obstructive sleep apnea (adult) (pediatric): Secondary | ICD-10-CM | POA: Diagnosis not present

## 2017-06-29 DIAGNOSIS — Z720 Tobacco use: Secondary | ICD-10-CM

## 2017-06-29 DIAGNOSIS — J930 Spontaneous tension pneumothorax: Secondary | ICD-10-CM

## 2017-07-01 ENCOUNTER — Other Ambulatory Visit: Payer: Self-pay | Admitting: Neurology

## 2017-07-01 DIAGNOSIS — Z720 Tobacco use: Secondary | ICD-10-CM

## 2017-07-01 DIAGNOSIS — R0602 Shortness of breath: Secondary | ICD-10-CM

## 2017-07-01 DIAGNOSIS — G4733 Obstructive sleep apnea (adult) (pediatric): Secondary | ICD-10-CM

## 2017-07-01 DIAGNOSIS — J930 Spontaneous tension pneumothorax: Secondary | ICD-10-CM

## 2017-07-01 DIAGNOSIS — J449 Chronic obstructive pulmonary disease, unspecified: Secondary | ICD-10-CM

## 2017-07-01 NOTE — Procedures (Signed)
[facility] [address] [place], [place] [address] NAME:  [name]                                                 DOB: 08 /18/1957 MEDICAL RECORD No:     [id number]                             DOS: 06/29/2017 REFERRING PHYSICIAN: [name], M.D.  STUDY PERFORMED: Home Sleep Study, Apnea Link  HISTORY: [name] is a 61 y.o. male to be evaluated for sleep apnea.  He presents with reports of loud snoring. He can also fall asleep anywhere, anytime. He reported an anecdote of falling asleep while being out on a fishing boat in the [place]. He is always fatigued.  He falls asleep watching TV and his family leaves the room- they cannot hear the movie plot while he thunderously snores. He is excessively daytime sleepy. Frequent nocturia. Likely COPD. BMI: 29.1 Epworth Sleepiness score: 19/24 points         STUDY RESULTS:  Total Recording Time:  8 hours 42 minutes Total Apnea/Hypopnea Index (AHI):  79.4 /hour Average Oxygen Saturation:  SpO2 76 %; Lowest Oxygen Saturation: 63 %  Total Time Oxygen Saturation Below 89%:  395 minutes!  76 % Average Heart Rate:  76 bpm (46-135)  IMPRESSION:  Severest sleep apnea of obstructive origin.  Severe hypoxemia- prolonged and associated with brady-tachycardic responses.  RECOMMENDATION: Immediate CPAP therapy, 5-18 cm water, nasal interface if tolerated, heated humidity. ONO after 30 days on CPAP to see if hypoxemia improved.   I certify that I have reviewed the raw data recording prior to the issuance of this report in accordance with the standards of the American Academy of Sleep Medicine (AASM). Asencion Partridge Yvanna Vidas,M.D.     07-01-2017   Medical Director of [facility] at [facility], [place] of the ABPN and ABSM, and accredited by AASM

## 2017-07-06 ENCOUNTER — Telehealth: Payer: Self-pay | Admitting: Neurology

## 2017-07-06 NOTE — Telephone Encounter (Signed)
Pt returned call. I advised pt that Dr. Brett Fairy reviewed their sleep study results and found that pt has sleep apnea. Dr. Brett Fairy recommends that pt starts auto CPAP. I reviewed PAP compliance expectations with the pt. Pt is agreeable to starting an auto-PAP. I advised pt that an order will be sent to a DME, [facility], and [facility] will call the pt within about one week after they file with the pt's insurance. [facility] will show the pt how to use the machine, fit for masks, and troubleshoot the auto-PAP if needed. A follow up appt was made for insurance purposes with Cecille Rubin on Sep 29, 2017 at 8:45am. Pt verbalized understanding to arrive 15 minutes early and bring their auto-PAP. A letter with all of this information in it will be mailed to the pt as a reminder. I verified with the pt that the address we have on file is correct. Pt verbalized understanding of results. Pt had no questions at this time but was encouraged to call back if questions arise.

## 2017-07-06 NOTE — Telephone Encounter (Signed)
-----   Message from Larey Seat, MD sent at 07/01/2017  5:29 PM EST ----- I would like for this patient to be titrated to CPAP in an attended setting, which allows to use oxygen in addition - if needed.  However , there are no more appointments within  the calendar year 2018. For this reason will order home CPAP auto - 5-18 cm water with Interface to be fitted by DME- I will need a download within 30 days  And one night on ONO to determine if oxygen is needed. Please use ONO before 08-03-2017 .   Cc Dr Karie Kirks

## 2017-07-06 NOTE — Telephone Encounter (Signed)
Called patient to discuss sleep study results. No answer at this time. LVM for the patient to call back.   

## 2017-07-20 ENCOUNTER — Ambulatory Visit: Payer: 59 | Admitting: Gastroenterology

## 2017-07-21 ENCOUNTER — Ambulatory Visit (INDEPENDENT_AMBULATORY_CARE_PROVIDER_SITE_OTHER): Payer: 59 | Admitting: Urology

## 2017-07-21 DIAGNOSIS — C61 Malignant neoplasm of prostate: Secondary | ICD-10-CM | POA: Diagnosis not present

## 2017-07-21 DIAGNOSIS — N5201 Erectile dysfunction due to arterial insufficiency: Secondary | ICD-10-CM | POA: Diagnosis not present

## 2017-07-23 ENCOUNTER — Encounter (HOSPITAL_COMMUNITY)
Admission: RE | Admit: 2017-07-23 | Discharge: 2017-07-23 | Disposition: A | Discharge status: Home / Self Care | Payer: 59 | Source: Ambulatory Visit | Attending: Internal Medicine | Admitting: Internal Medicine

## 2017-07-24 ENCOUNTER — Encounter (HOSPITAL_COMMUNITY)
Admission: RE | Admit: 2017-07-24 | Discharge: 2017-07-24 | Disposition: A | Discharge status: Home / Self Care | Payer: 59 | Source: Ambulatory Visit | Attending: Internal Medicine | Admitting: Internal Medicine

## 2017-07-30 ENCOUNTER — Encounter (HOSPITAL_COMMUNITY): Payer: Self-pay | Admitting: *Deleted

## 2017-07-30 ENCOUNTER — Ambulatory Visit (HOSPITAL_COMMUNITY)
Admission: RE | Admit: 2017-07-30 | Discharge: 2017-07-30 | Disposition: A | Discharge status: Home / Self Care | Payer: 59 | Source: Ambulatory Visit | Attending: Internal Medicine | Admitting: Internal Medicine

## 2017-07-30 ENCOUNTER — Ambulatory Visit (HOSPITAL_COMMUNITY): Payer: 59 | Admitting: Anesthesiology

## 2017-07-30 ENCOUNTER — Encounter (HOSPITAL_COMMUNITY)
Admission: RE | Disposition: A | Discharge status: Home / Self Care | Payer: Self-pay | Source: Ambulatory Visit | Attending: Internal Medicine

## 2017-07-30 DIAGNOSIS — K222 Esophageal obstruction: Secondary | ICD-10-CM | POA: Insufficient documentation

## 2017-07-30 DIAGNOSIS — K449 Diaphragmatic hernia without obstruction or gangrene: Secondary | ICD-10-CM | POA: Diagnosis not present

## 2017-07-30 DIAGNOSIS — G4733 Obstructive sleep apnea (adult) (pediatric): Secondary | ICD-10-CM | POA: Diagnosis not present

## 2017-07-30 DIAGNOSIS — K2100 Gastro-esophageal reflux disease with esophagitis, without bleeding: Secondary | ICD-10-CM

## 2017-07-30 DIAGNOSIS — J439 Emphysema, unspecified: Secondary | ICD-10-CM | POA: Diagnosis not present

## 2017-07-30 DIAGNOSIS — I10 Essential (primary) hypertension: Secondary | ICD-10-CM | POA: Diagnosis not present

## 2017-07-30 DIAGNOSIS — Z8546 Personal history of malignant neoplasm of prostate: Secondary | ICD-10-CM | POA: Insufficient documentation

## 2017-07-30 DIAGNOSIS — R1310 Dysphagia, unspecified: Secondary | ICD-10-CM

## 2017-07-30 DIAGNOSIS — Z86010 Personal history of colonic polyps: Secondary | ICD-10-CM | POA: Insufficient documentation

## 2017-07-30 DIAGNOSIS — F17210 Nicotine dependence, cigarettes, uncomplicated: Secondary | ICD-10-CM | POA: Insufficient documentation

## 2017-07-30 DIAGNOSIS — Z79899 Other long term (current) drug therapy: Secondary | ICD-10-CM | POA: Insufficient documentation

## 2017-07-30 DIAGNOSIS — R1314 Dysphagia, pharyngoesophageal phase: Secondary | ICD-10-CM | POA: Diagnosis present

## 2017-07-30 DIAGNOSIS — R1319 Other dysphagia: Secondary | ICD-10-CM

## 2017-07-30 DIAGNOSIS — Z8711 Personal history of peptic ulcer disease: Secondary | ICD-10-CM | POA: Diagnosis not present

## 2017-07-30 DIAGNOSIS — K219 Gastro-esophageal reflux disease without esophagitis: Secondary | ICD-10-CM | POA: Diagnosis not present

## 2017-07-30 HISTORY — PX: ESOPHAGOGASTRODUODENOSCOPY (EGD) WITH PROPOFOL: SHX5813

## 2017-07-30 HISTORY — PX: BIOPSY: SHX5522

## 2017-07-30 HISTORY — PX: MALONEY DILATION: SHX5535

## 2017-07-30 SURGERY — ESOPHAGOGASTRODUODENOSCOPY (EGD) WITH PROPOFOL
Anesthesia: Monitor Anesthesia Care

## 2017-07-30 MED ORDER — MIDAZOLAM HCL 2 MG/2ML IJ SOLN
INTRAMUSCULAR | Status: AC
  Filled 2017-07-30: qty 2

## 2017-07-30 MED ORDER — PROPOFOL 500 MG/50ML IV EMUL
INTRAVENOUS | Status: DC | PRN
  Administered 2017-07-30: 100 ug/kg/min via INTRAVENOUS
  Administered 2017-07-30: 135 ug/kg/min via INTRAVENOUS

## 2017-07-30 MED ORDER — MIDAZOLAM HCL 5 MG/5ML IJ SOLN
INTRAMUSCULAR | Status: DC | PRN
  Administered 2017-07-30: 2 mg via INTRAVENOUS

## 2017-07-30 MED ORDER — LIDOCAINE VISCOUS 2 % MT SOLN
5.0000 mL | Freq: Two times a day (BID) | OROMUCOSAL | Status: AC
  Administered 2017-07-30 (×2): 5 mL via OROMUCOSAL

## 2017-07-30 MED ORDER — FENTANYL CITRATE (PF) 100 MCG/2ML IJ SOLN
25.0000 ug | Freq: Once | INTRAMUSCULAR | Status: AC
  Administered 2017-07-30: 25 ug via INTRAVENOUS

## 2017-07-30 MED ORDER — MIDAZOLAM HCL 2 MG/2ML IJ SOLN
1.0000 mg | INTRAMUSCULAR | Status: AC
  Administered 2017-07-30: 2 mg via INTRAVENOUS

## 2017-07-30 MED ORDER — CHLORHEXIDINE GLUCONATE CLOTH 2 % EX PADS: 6.0000 | MEDICATED_PAD | Freq: Once | CUTANEOUS | Status: DC

## 2017-07-30 MED ORDER — CHLORHEXIDINE GLUCONATE CLOTH 2 % EX PADS
6.0000 | MEDICATED_PAD | Freq: Once | CUTANEOUS | Status: DC
Start: 2017-07-30 — End: 2017-07-30

## 2017-07-30 MED ORDER — LACTATED RINGERS IV SOLN
INTRAVENOUS | Status: DC
  Administered 2017-07-30: 1000 mL via INTRAVENOUS

## 2017-07-30 MED ORDER — FENTANYL CITRATE (PF) 100 MCG/2ML IJ SOLN
INTRAMUSCULAR | Status: AC
  Filled 2017-07-30: qty 2

## 2017-07-30 MED ORDER — PROPOFOL 10 MG/ML IV BOLUS
INTRAVENOUS | Status: AC
  Filled 2017-07-30: qty 40

## 2017-07-30 MED ORDER — LIDOCAINE VISCOUS 2 % MT SOLN
OROMUCOSAL | Status: AC
  Filled 2017-07-30: qty 15

## 2017-07-30 NOTE — H&P (Signed)
@LOGO @   Primary Care Physician:  [name], MD Primary Gastroenterologist:  Dr. Gala Romney  Pre-Procedure History & Physical: HPI:  [name] is a 61 y.o. male here for evaluation of recurrent esophageal dysphagia. He narrative the GE junction 6 months ago. Recurrent esophageal dysphagia after 54 French Maloney dilator passed.  Past Medical History:  Diagnosis Date  . Bilateral shoulder bursitis   . COPD (chronic obstructive pulmonary disease) with emphysema ([place])   . DDD (degenerative disc disease), lumbar    hx epideral injection  L5--S1   . Dysphasia    intermittant w/ food   . Dyspnea on exertion   . GERD (gastroesophageal reflux disease)   . Hiatal hernia   . History of acute pancreatitis    alcoholic pancreatitis [id number] and 01-30-2012  . History of colon polyps    10-17-2005  hyperplastic polyp's  . History of esophageal stricture    s/p  dilation 02-02-2017  . History of peptic ulcer 1990s  . History of pneumococcal pneumonia 2006   bilateral pneumonia w/ left lower lobe abscess treated w/ chest tube with suction for drainage  . History of pneumothorax    1984--  left spontaneous pneumothorax ,  treated w/ chest tube  . Hypertension   . Malignant neoplasm of prostate [facility]) urologist-  dr dahlstedt/  oncologist -- dr Tammi Klippel   dx 12-02-2016-- Stage T2b, Gleason 3+4,  PSA 5.99,  vol 25cc  . Numbness of left foot    outer left ankle numb-- per pt has appt. w/ neurologist  . OSA (obstructive sleep apnea)    per pt has not used cpap over year ago from 04-16-2017--- per study 01-17-2010  severe osa  . Wears glasses     Past Surgical History:  Procedure Laterality Date  . BIOPSY  02/02/2017   Procedure: BIOPSY;  Surgeon: Daneil Dolin, MD;  [facility];  Service: Endoscopy;;  duodenal bx's, esophgeal bx's  . CARDIOVASCULAR STRESS TEST  09/12/2009   normal nuclear perfusion study w/ no ischemia /  normal LV function and wall motion , ef 57%  .  COLONOSCOPY  2007   Dr. Gala Romney: hyperplastic polyps, internal hemorrhoids   . ESOPHAGOGASTRODUODENOSCOPY (EGD) WITH PROPOFOL N/A 02/02/2017   Procedure: ESOPHAGOGASTRODUODENOSCOPY (EGD) WITH PROPOFOL;  Surgeon: Daneil Dolin, MD;  [facility];  Service: Endoscopy;  Laterality: N/A;  2:00pm  . HEMORRHOID SURGERY  01-21-2008    Forestine Na  . MALONEY DILATION N/A 02/02/2017   Procedure: Venia Minks DILATION;  Surgeon: Daneil Dolin, MD;  Location: [facility];  Service: Endoscopy;  Laterality: N/A;  . RADIOACTIVE SEED IMPLANT N/A 04/23/2017   Procedure: RADIOACTIVE SEED IMPLANT/BRACHYTHERAPY IMPLANT;  Surgeon: Franchot Gallo, MD;  Location: [facility];  Service: Urology;  Laterality: N/A;  . SPACE OAR INSTILLATION N/A 04/23/2017   Procedure: SPACE OAR INSTILLATION;  Surgeon: Franchot Gallo, MD;  Location: [facility];  Service: Urology;  Laterality: N/A;  . TRANSTHORACIC ECHOCARDIOGRAM  03/28/2009   mild LVH, ef 55-60%/ mild aorta calcification  . VIDEO ASSISTED THORACOSCOPY (VATS)/DECORTICATION Left     Prior to Admission medications   Medication Sig Start Date End Date Taking? Authorizing Provider  albuterol (ACCUNEB) 0.63 MG/3ML nebulizer solution Take 1 ampule by nebulization 2 (two) times daily.   Yes [provider]  ibuprofen (ADVIL,MOTRIN) 200 MG tablet Take 400 mg by mouth daily as needed for mild pain.   Yes [provider]  SYMBICORT 160-4.5 MCG/ACT  inhaler INHALE 2 PUFFS BY MOUTH TWICE DAILY  FOR COPD 03/07/17  Yes [provider]  diphenhydrAMINE (BENADRYL) 25 MG tablet Take 1 tablet (25 mg total) every 6 (six) hours by mouth. Patient not taking: Reported on 07/20/2017 06/23/17   Ezequiel Essex, MD  famotidine (PEPCID) 20 MG tablet Take 1 tablet (20 mg total) 2 (two) times daily by mouth. Patient not taking: Reported on 07/20/2017 06/23/17   Rancour, Annie Main, MD  ipratropium-albuterol (DUONEB) 0.5-2.5 (3)  MG/3ML SOLN Take 3 mLs by nebulization every 6 (six) hours as needed (shortness of breath).  01/26/17   [provider]  pantoprazole (PROTONIX) 40 MG tablet Take 1 tablet (40 mg total) by mouth daily. 30 minutes before breakfast Patient not taking: Reported on 07/20/2017 01/29/17   Annitta Needs, NP  predniSONE (DELTASONE) 50 MG tablet 1 tablet PO daily Patient not taking: Reported on 07/20/2017 06/23/17   Ezequiel Essex, MD    Allergies as of 06/11/2017  . (No Known Allergies)    Family History  Problem Relation Age of Onset  . Cancer Neg Hx   . Colon cancer Neg Hx   . Colon polyps Neg Hx   . Neuropathy Neg Hx     Social History   Socioeconomic History  . Marital status: Married    Spouse name: Not on file  . Number of children: Not on file  . Years of education: Not on file  . Highest education level: Not on file  Social Needs  . Financial resource strain: Not on file  . Food insecurity - worry: Not on file  . Food insecurity - inability: Not on file  . Transportation needs - medical: Not on file  . Transportation needs - non-medical: Not on file  Occupational History  . Not on file  Tobacco Use  . Smoking status: Current Every Day Smoker    Packs/day: 1.00    Years: 30.00    Pack years: 30.00    Types: Cigarettes, E-cigarettes    Last attempt to quit: 11/03/2015    Years since quitting: 1.7  . Smokeless tobacco: Never Used  Substance and Sexual Activity  . Alcohol use: Yes    Alcohol/week: 12.6 oz    Types: 21 Shots of liquor per week    Comment: 2-3 drinks daily  . Drug use: No  . Sexual activity: Not on file  Other Topics Concern  . Not on file  Social History Narrative  . Not on file    Review of Systems: See HPI, otherwise negative ROS  Physical Exam: BP 126/85   Pulse 84   Temp 98.4 F (36.9 C) (Oral)   Resp 15   Ht 6\' 2"  (1.88 m)   Wt 225 lb (102.1 kg)   SpO2 92%   BMI 28.89 kg/m  General:   Alert,  Well-developed,  well-nourished, pleasant and cooperative in NAD Neck:  Supple; no masses or thyromegaly. No significant cervical adenopathy. Lungs:  Clear throughout to auscultation.   No wheezes, crackles, or rhonchi. No acute distress. Heart:  Regular rate and rhythm; no murmurs, clicks, rubs,  or gallops. Abdomen: Non-distended, normal bowel sounds.  Soft and nontender without appreciable mass or hepatosplenomegaly.  Pulses:  Normal pulses noted. Extremities:  Without clubbing or edema.  Impression:   61 year old here for recurrent esophageal dysphagia. To be evaluated with repeat EGD and possible esophageal dilation.  Pathologist did not see any esophageal tissue with prior biopsies. Likely biopsy to rule out E OE.  Recommendations:  I have offered the patient to EGD with EGD as feasible/appropriate.  The risks, benefits, limitations, alternatives and imponderables have been reviewed with the patient. Potential for esophageal dilation, biopsy, etc. have also been reviewed.  Questions have been answered. All parties agreeable.   Notice: This dictation was prepared with Dragon dictation along with smaller phrase technology. Any transcriptional errors that result from this process are unintentional and may not be corrected upon review.

## 2017-07-30 NOTE — Anesthesia Postprocedure Evaluation (Signed)
Anesthesia Post Note  Patient: [name]  Procedure(s) Performed: ESOPHAGOGASTRODUODENOSCOPY (EGD) WITH PROPOFOL (N/A ) MALONEY DILATION (N/A ) BIOPSY  Patient location during evaluation: PACU Anesthesia Type: MAC Level of consciousness: awake and patient cooperative Pain management: pain level controlled Vital Signs Assessment: post-procedure vital signs reviewed and stable Respiratory status: spontaneous breathing, nonlabored ventilation and respiratory function stable Cardiovascular status: blood pressure returned to baseline Postop Assessment: no apparent nausea or vomiting Anesthetic complications: no     Last Vitals:  Vitals:   07/30/17 0725 07/30/17 0730  BP: 133/87 129/88  Pulse:    Resp: 20 20  Temp:    SpO2: 91% 92%    Last Pain:  Vitals:   07/30/17 0653  TempSrc: Oral                 Lianne Carreto J

## 2017-07-30 NOTE — Discharge Instructions (Addendum)
Hiatal Hernia A hiatal hernia occurs when part of the stomach slides above the muscle that separates the abdomen from the chest (diaphragm). A person can be born with a hiatal hernia (congenital), or it may develop over time. In almost all cases of hiatal hernia, only the top part of the stomach pushes through the diaphragm. Many people have a hiatal hernia with no symptoms. The larger the hernia, the more likely it is that you will have symptoms. In some cases, a hiatal hernia allows stomach acid to flow back into the tube that carries food from your mouth to your stomach (esophagus). This may cause heartburn symptoms. Severe heartburn symptoms may mean that you have developed a condition called gastroesophageal reflux disease (GERD). What are the causes? This condition is caused by a weakness in the opening (hiatus) where the esophagus passes through the diaphragm to attach to the upper part of the stomach. A person may be born with a weakness in the hiatus, or a weakness can develop over time. What increases the risk? This condition is more likely to develop in:  Older people. Age is a major risk factor for a hiatal hernia, especially if you are over the age of 9.  Pregnant women.  People who are overweight.  People who have frequent constipation.  What are the signs or symptoms? Symptoms of this condition usually develop in the form of GERD symptoms. Symptoms include:  Heartburn.  Belching.  Indigestion.  Trouble swallowing.  Coughing or wheezing.  Sore throat.  Hoarseness.  Chest pain.  Nausea and vomiting.  How is this diagnosed? This condition may be diagnosed during testing for GERD. Tests that may be done include:  X-rays of your stomach or chest.  An upper gastrointestinal (GI) series. This is an X-ray exam of your GI tract that is taken after you swallow a chalky liquid that shows up clearly on the X-ray.  Endoscopy. This is a procedure to look into your  stomach using a thin, flexible tube that has a tiny camera and light on the end of it.  How is this treated? This condition may be treated by:  Dietary and lifestyle changes to help reduce GERD symptoms.  Medicines. These may include: ? Over-the-counter antacids. ? Medicines that make your stomach empty more quickly. ? Medicines that block the production of stomach acid (H2 blockers). ? Stronger medicines to reduce stomach acid (proton pump inhibitors).  Surgery to repair the hernia, if other treatments are not helping.  If you have no symptoms, you may not need treatment. Follow these instructions at home: Lifestyle and activity  Do not use any products that contain nicotine or tobacco, such as cigarettes and e-cigarettes. If you need help quitting, ask your health care provider.  Try to achieve and maintain a healthy body weight.  Avoid putting pressure on your abdomen. Anything that puts pressure on your abdomen increases the amount of acid that may be pushed up into your esophagus. ? Avoid bending over, especially after eating. ? Raise the head of your bed by putting blocks under the legs. This keeps your head and esophagus higher than your stomach. ? Do not wear tight clothing around your chest or stomach. ? Try not to strain when having a bowel movement, when urinating, or when lifting heavy objects. Eating and drinking  Avoid foods that can worsen GERD symptoms. These may include: ? Fatty foods, like fried foods. ? Citrus fruits, like oranges or lemon. ? Other foods and drinks that  contain acid, like orange juice or tomatoes. ? Spicy food. ? Chocolate.  Eat frequent small meals instead of three large meals a day. This helps prevent your stomach from getting too full. ? Eat slowly. ? Do not lie down right after eating. ? Do not eat 1-2 hours before bed.  Do not drink beverages with caffeine. These include cola, coffee, cocoa, and tea.  Do not drink alcohol. General  instructions  Take over-the-counter and prescription medicines only as told by your health care provider.  Keep all follow-up visits as told by your health care provider. This is important. Contact a health care provider if:  Your symptoms are not controlled with medicines or lifestyle changes.  You are having trouble swallowing.  You have coughing or wheezing that will not go away. Get help right away if:  Your pain is getting worse.  Your pain spreads to your arms, neck, jaw, teeth, or back.  You have shortness of breath.  You sweat for no reason.  You feel sick to your stomach (nauseous) or you vomit.  You vomit blood.  You have bright red blood in your stools.  You have black, tarry stools. This information is not intended to replace advice given to you by your health care provider. Make sure you discuss any questions you have with your health care provider. Document Released: 10/11/2003 Document Revised: 07/14/2016 Document Reviewed: 07/14/2016 Elsevier Interactive Patient Education  2018 [place]. Gastroesophageal Reflux Disease, Adult Normally, food travels down the esophagus and stays in the stomach to be digested. However, when a person has gastroesophageal reflux disease (GERD), food and stomach acid move back up into the esophagus. When this happens, the esophagus becomes sore and inflamed. Over time, GERD can create small holes (ulcers) in the lining of the esophagus. What are the causes? This condition is caused by a problem with the muscle between the esophagus and the stomach (lower esophageal sphincter, or LES). Normally, the LES muscle closes after food passes through the esophagus to the stomach. When the LES is weakened or abnormal, it does not close properly, and that allows food and stomach acid to go back up into the esophagus. The LES can be weakened by certain dietary substances, medicines, and medical conditions, including:  Tobacco  use.  Pregnancy.  Having a hiatal hernia.  Heavy alcohol use.  Certain foods and beverages, such as coffee, chocolate, onions, and peppermint.  What increases the risk? This condition is more likely to develop in:  People who have an increased body weight.  People who have connective tissue disorders.  People who use NSAID medicines.  What are the signs or symptoms? Symptoms of this condition include:  Heartburn.  Difficult or painful swallowing.  The feeling of having a lump in the throat.  Abitter taste in the mouth.  Bad breath.  Having a large amount of saliva.  Having an upset or bloated stomach.  Belching.  Chest pain.  Shortness of breath or wheezing.  Ongoing (chronic) cough or a night-time cough.  Wearing away of tooth enamel.  Weight loss.  Different conditions can cause chest pain. Make sure to see your health care provider if you experience chest pain. How is this diagnosed? Your health care provider will take a medical history and perform a physical exam. To determine if you have mild or severe GERD, your health care provider may also monitor how you respond to treatment. You may also have other tests, including:  An endoscopy toexamine your stomach  and esophagus with a small camera.  A test thatmeasures the acidity level in your esophagus.  A test thatmeasures how much pressure is on your esophagus.  A barium swallow or modified barium swallow to show the shape, size, and functioning of your esophagus.  How is this treated? The goal of treatment is to help relieve your symptoms and to prevent complications. Treatment for this condition may vary depending on how severe your symptoms are. Your health care provider may recommend:  Changes to your diet.  Medicine.  Surgery.  Follow these instructions at home: Diet  Follow a diet as recommended by your health care provider. This may involve avoiding foods and drinks such  as: ? Coffee and tea (with or without caffeine). ? Drinks that containalcohol. ? Energy drinks and sports drinks. ? Carbonated drinks or sodas. ? Chocolate and cocoa. ? Peppermint and mint flavorings. ? Garlic and onions. ? Horseradish. ? Spicy and acidic foods, including peppers, chili powder, curry powder, vinegar, hot sauces, and barbecue sauce. ? Citrus fruit juices and citrus fruits, such as oranges, lemons, and limes. ? Tomato-based foods, such as red sauce, chili, salsa, and pizza with red sauce. ? Fried and fatty foods, such as donuts, french fries, potato chips, and high-fat dressings. ? High-fat meats, such as hot dogs and fatty cuts of red and white meats, such as rib eye steak, sausage, ham, and bacon. ? High-fat dairy items, such as whole milk, butter, and cream cheese.  Eat small, frequent meals instead of large meals.  Avoid drinking large amounts of liquid with your meals.  Avoid eating meals during the 2-3 hours before bedtime.  Avoid lying down right after you eat.  Do not exercise right after you eat. General instructions  Pay attention to any changes in your symptoms.  Take over-the-counter and prescription medicines only as told by your health care provider. Do not take aspirin, ibuprofen, or other NSAIDs unless your health care provider told you to do so.  Do not use any tobacco products, including cigarettes, chewing tobacco, and e-cigarettes. If you need help quitting, ask your health care provider.  Wear loose-fitting clothing. Do not wear anything tight around your waist that causes pressure on your abdomen.  Raise (elevate) the head of your bed 6 inches (15cm).  Try to reduce your stress, such as with yoga or meditation. If you need help reducing stress, ask your health care provider.  If you are overweight, reduce your weight to an amount that is healthy for you. Ask your health care provider for guidance about a safe weight loss goal.  Keep all  follow-up visits as told by your health care provider. This is important. Contact a health care provider if:  You have new symptoms.  You have unexplained weight loss.  You have difficulty swallowing, or it hurts to swallow.  You have wheezing or a persistent cough.  Your symptoms do not improve with treatment.  You have a hoarse voice. Get help right away if:  You have pain in your arms, neck, jaw, teeth, or back.  You feel sweaty, dizzy, or light-headed.  You have chest pain or shortness of breath.  You vomit and your vomit looks like blood or coffee grounds.  You faint.  Your stool is bloody or black.  You cannot swallow, drink, or eat. This information is not intended to replace advice given to you by your health care provider. Make sure you discuss any questions you have with your health care  provider. Document Released: 04/30/2005 Document Revised: 12/19/2015 Document Reviewed: 11/15/2014 Elsevier Interactive Patient Education  2018 [employer]. EGD Discharge instructions Please read the instructions outlined below and refer to this sheet in the next few weeks. These discharge instructions provide you with general information on caring for yourself after you leave the hospital. Your doctor may also give you specific instructions. While your treatment has been planned according to the most current medical practices available, unavoidable complications occasionally occur. If you have any problems or questions after discharge, please call your doctor. ACTIVITY  You may resume your regular activity but move at a slower pace for the next 24 hours.   Take frequent rest periods for the next 24 hours.   Walking will help expel (get rid of) the air and reduce the bloated feeling in your abdomen.   No driving for 24 hours (because of the anesthesia (medicine) used during the test).   You may shower.   Do not sign any important legal documents or operate any machinery for  24 hours (because of the anesthesia used during the test).  NUTRITION  Drink plenty of fluids.   You may resume your normal diet.   Begin with a light meal and progress to your normal diet.   Avoid alcoholic beverages for 24 hours or as instructed by your caregiver.  MEDICATIONS  You may resume your normal medications unless your caregiver tells you otherwise.  WHAT YOU CAN EXPECT TODAY  You may experience abdominal discomfort such as a feeling of fullness or gas pains.  FOLLOW-UP  Your doctor will discuss the results of your test with you.  SEEK IMMEDIATE MEDICAL ATTENTION IF ANY OF THE FOLLOWING OCCUR:  Excessive nausea (feeling sick to your stomach) and/or vomiting.   Severe abdominal pain and distention (swelling).   Trouble swallowing.   Temperature over 101 F (37.8 C).   Rectal bleeding or vomiting of blood.    GERD and hiatal hernia information provided  Continue Protonix 40 mg daily  Further recommendations to follow pending review of pathology report  Office visit with [place] in 3 months

## 2017-07-30 NOTE — Op Note (Signed)
[facility] Patient Name: [name] Procedure Date: 07/30/2017 7:10 AM MRN: [id number] Date of Birth: 1956/02/27 Attending MD: Norvel Richards , MD CSN: [id number] Age: 61 Admit Type: Outpatient Procedure:                Upper GI endoscopy Indications:              Dysphagia Providers:                Norvel Richards, MD, Lurline Del, RN, Nelma Rothman, Technician, Randa Spike, Technician Referring MD:              Medicines:                Propofol per Anesthesia Complications:            No immediate complications. Estimated Blood Loss:     Estimated blood loss was minimal. Estimated blood                            loss: none. Estimated blood loss was minimal. Procedure:                Pre-Anesthesia Assessment:                           - Prior to the procedure, a History and Physical                            was performed, and patient medications and                            allergies were reviewed. The patient's tolerance of                            previous anesthesia was also reviewed. The risks                            and benefits of the procedure and the sedation                            options and risks were discussed with the patient.                            All questions were answered, and informed consent                            was obtained. Prior Anticoagulants: The patient has                            taken no previous anticoagulant or antiplatelet                            agents. ASA Grade Assessment: II - A patient with  mild systemic disease. After reviewing the risks                            and benefits, the patient was deemed in                            satisfactory condition to undergo the procedure.                           After obtaining informed consent, the endoscope was                            passed under direct vision. Throughout the          procedure, the patient's blood pressure, pulse, and                            oxygen saturations were monitored continuously. The                            [id number]) was introduced through the and                            advanced to the second part of duodenum. The upper                            GI endoscopy was accomplished without difficulty.                            The patient tolerated the procedure well. Scope In: 7:41:25 AM Scope Out: 7:52:40 AM Total Procedure Duration: 0 hours 11 minutes 15 seconds  Findings:      Crep paper esophagus. at the GE junction. No tumor. mild narrowing.      A medium-sized hiatal hernia was present.      The cardia and gastric fundus were normal on retroflexion.      The duodenal bulb and second portion of the duodenum were normal.      The scope was withdrawn. Dilation was performed with a Maloney dilator       with no resistance at 73 Fr. Dilation was performed with a Maloney       dilator with mild resistance at 56 Fr. The dilation site was examined       following endoscope reinsertion and showed mild improvement in luminal       narrowing. Estimated blood loss was minimal. Finally, biopsies of the       mid and distal esophagus taken for histologic study. Impression:               - Esophageal stenoses. Dilated. Biopsied.                           - Medium-sized hiatal hernia.                           - Normal duodenal bulb and second portion of the  duodenum. Moderate Sedation:      Moderate (conscious) sedation was personally administered by an       anesthesia professional. The following parameters were monitored: oxygen       saturation, heart rate, blood pressure, respiratory rate, EKG, adequacy       of pulmonary ventilation, and response to care. Total physician       intraservice time was 13 minutes. Recommendation:           - Patient has a contact number available for                             emergencies. The signs and symptoms of potential                            delayed complications were discussed with the                            patient. Return to normal activities tomorrow.                            Written discharge instructions were provided to the                            patient.                           - Advance diet as tolerated.                           - Continue present medications.                           - Await pathology results.                           - No repeat upper endoscopy.                           - Return to GI office in 3 months. Procedure Code(s):        --- Professional ---                           [phone], Esophagogastroduodenoscopy, flexible,                            transoral; diagnostic, including collection of                            specimen(s) by brushing or washing, when performed                            (separate procedure)                           84132, Dilation of esophagus, by unguided sound or  bougie, single or multiple passes Diagnosis Code(s):        --- Professional ---                           K22.2, Esophageal obstruction                           K44.9, Diaphragmatic hernia without obstruction or                            gangrene                           R13.10, Dysphagia, unspecified CPT copyright 2016 American Medical Association. All rights reserved. The codes documented in this report are preliminary and upon coder review may  be revised to meet current compliance requirements. [name]. [name], MD Norvel Richards, MD 07/30/2017 8:07:19 AM This report has been signed electronically. Number of Addenda: 0

## 2017-07-30 NOTE — Anesthesia Preprocedure Evaluation (Signed)
Anesthesia Evaluation  Patient identified by MRN, date of birth, ID band Patient awake    Reviewed: Allergy & Precautions, NPO status , Patient's Chart, lab work & pertinent test results  Airway Mallampati: I  TM Distance: >3 FB Neck ROM: Full    Dental  (+) Teeth Intact   Pulmonary shortness of breath and with exertion, sleep apnea and Continuous Positive Airway Pressure Ventilation , COPD, Current Smoker, former smoker,    breath sounds clear to auscultation       Cardiovascular hypertension, Pt. on medications  Rhythm:Regular Rate:Normal     Neuro/Psych    GI/Hepatic GERD  ,(+)     substance abuse  alcohol use,   Endo/Other    Renal/GU      Musculoskeletal   Abdominal   Peds  Hematology   Anesthesia Other Findings   Reproductive/Obstetrics                             Anesthesia Physical Anesthesia Plan  ASA: III  Anesthesia Plan: MAC   Post-op Pain Management:    Induction: Intravenous  PONV Risk Score and Plan:   Airway Management Planned: Simple Face Mask  Additional Equipment:   Intra-op Plan:   Post-operative Plan:   Informed Consent: I have reviewed the patients History and Physical, chart, labs and discussed the procedure including the risks, benefits and alternatives for the proposed anesthesia with the patient or authorized representative who has indicated his/her understanding and acceptance.     Plan Discussed with:   Anesthesia Plan Comments:         Anesthesia Quick Evaluation

## 2017-07-30 NOTE — Transfer of Care (Signed)
Immediate Anesthesia Transfer of Care Note  Patient: [name]  Procedure(s) Performed: ESOPHAGOGASTRODUODENOSCOPY (EGD) WITH PROPOFOL (N/A ) MALONEY DILATION (N/A ) BIOPSY  Patient Location: PACU  Anesthesia Type:MAC  Level of Consciousness: awake and patient cooperative  Airway & Oxygen Therapy: Patient Spontanous Breathing and Patient connected to face mask oxygen  Post-op Assessment: Report given to RN, Post -op Vital signs reviewed and stable and Patient moving all extremities  Post vital signs: Reviewed and stable  Last Vitals:  Vitals:   07/30/17 0725 07/30/17 0730  BP: 133/87 129/88  Pulse:    Resp: 20 20  Temp:    SpO2: 91% 92%    Last Pain:  Vitals:   07/30/17 0653  TempSrc: Oral      Patients Stated Pain Goal: 9 (06/06/14 9458)  Complications: No apparent anesthesia complications

## 2017-08-03 ENCOUNTER — Encounter (HOSPITAL_COMMUNITY): Payer: Self-pay | Admitting: Internal Medicine

## 2017-08-06 ENCOUNTER — Encounter: Payer: Self-pay | Admitting: Internal Medicine

## 2017-09-01 ENCOUNTER — Encounter: Payer: Self-pay | Admitting: Internal Medicine

## 2017-09-28 NOTE — Progress Notes (Signed)
[name] DOB: 1955-12-22   REASON FOR VISIT: Obstructive sleep apnea here for initial CPAP compliance HISTORY FROM: Patient    HISTORY OF PRESENT ILLNESS:UPDATE 2/26/2019[name], 62 year old male returns for follow-up with history of newly diagnosed obstructive sleep apnea.  He is here for his initial CPAP compliance.  He states he had a period of time to get adjusted to the CPAP but he feels he is adjusted now.  He complains of his mask leaking at times even though it is tight.  He does have a beard.  Compliance data dated 08/30/2017 to 25 2019 shows compliance greater than 4 hours at 83%.  Average usage 5 hours 48 minutes.  Set pressure 5-18 cm.  EPR level 3 AHI 4.4 95th percentile leak at 14.3.  He returns for reevaluation 06/02/17/[name] is a 62 y.o. male , seen here as in a referral   from Dr. Karie Kirks for sleep apnea. [name] medical history is positive for a lung abscess in the year 2008, a pneumothorax in 1983, bleeding hemorrhoids in 2010, surgical treatment. Shoulder surgery 2008 on  both sides- Not related to the rotator cuff. Prostate cancer with seed implants earlier this year 2018. The procedure was just done on September 20. [name] has also seen my colleague Dr. Sarina Ill.   [name] presents today mainly because of his loud snoring, which has been brought to his attention by his family and friends. He can also fall asleep anywhere anytime. He reported an anicteric of falling asleep by being out on a fishing boat in [place]. He is fatigued.  He falls asleep watching TV and his family leaves the room- they can not hear the movies plot.    Sleep habits are as follows: Usually the patient spends the last hour before going to bed either watching TV or reading, and often falling asleep during it. He sleeps in a fetal position on his left side- on 2 pillows. The bedroom is cool, quiet and dark.    Sometimes he will stay put in his den for the whole night. He will usually transfer to the bedroom before midnight, and has no trouble falling asleep in his bed again. Since he had the prostate, the treatment he developed nocturia,goes now to the bathroom for about 3-4 times each night. This was not the case prior. Usually nocturia happens every 2 hours.Occasionally he will also wake himself choking or short of air, and with the feeling of an extremely dry mouth. His snoring has been described as thunderous, and his breathing is irregular. His wife has noted him always moving his legs at night- a condition that was already known in childhood. Has not kept him from sleeping.  He typically rises at 4 or 5 AM in the morning, waking up spontaneously without alarm.  REVIEW OF SYSTEMS: Full 14 system review of systems performed and notable only for those listed, all others are neg:  Constitutional: neg  Cardiovascular: neg Ear/Nose/Throat: neg  Skin: neg Eyes: neg Respiratory: neg Gastroitestinal: neg  Hematology/Lymphatic: neg  Endocrine: neg Musculoskeletal:neg Allergy/Immunology: neg Neurological: neg Psychiatric: neg Sleep : Obstructive sleep apnea with CPAP   ALLERGIES: No Known Allergies  HOME MEDICATIONS: Outpatient Medications Prior to Visit  Medication Sig Dispense Refill  . albuterol (ACCUNEB) 0.63 MG/3ML nebulizer solution Take 1 ampule by nebulization 2 (two) times daily.    . diphenhydrAMINE (BENADRYL) 25 MG tablet Take 1 tablet (25  mg total) every 6 (six) hours by mouth. 20 tablet 0  . ibuprofen (ADVIL,MOTRIN) 200 MG tablet Take 400 mg by mouth daily as needed for mild pain.    Marland Kitchen ipratropium-albuterol (DUONEB) 0.5-2.5 (3) MG/3ML SOLN Take 3 mLs by nebulization every 6 (six) hours as needed (shortness of breath).     . SYMBICORT 160-4.5 MCG/ACT inhaler INHALE 2 PUFFS BY MOUTH TWICE DAILY  FOR COPD  11  . famotidine (PEPCID) 20 MG tablet Take 1 tablet (20 mg total) 2 (two)  times daily by mouth. (Patient not taking: Reported on 07/20/2017) 30 tablet 0  . pantoprazole (PROTONIX) 40 MG tablet Take 1 tablet (40 mg total) by mouth daily. 30 minutes before breakfast (Patient not taking: Reported on 07/20/2017) 90 tablet 3  . predniSONE (DELTASONE) 50 MG tablet 1 tablet PO daily (Patient not taking: Reported on 07/20/2017) 5 tablet 0   No facility-administered medications prior to visit.     PAST MEDICAL HISTORY: Past Medical History:  Diagnosis Date  . Bilateral shoulder bursitis   . COPD (chronic obstructive pulmonary disease) with emphysema ([place])   . DDD (degenerative disc disease), lumbar    hx epideral injection  L5--S1   . Dysphasia    intermittant w/ food   . Dyspnea on exertion   . GERD (gastroesophageal reflux disease)   . Hiatal hernia   . History of acute pancreatitis    alcoholic pancreatitis [phone] and 01-30-2012  . History of colon polyps    10-17-2005  hyperplastic polyp's  . History of esophageal stricture    s/p  dilation 02-02-2017  . History of peptic ulcer 1990s  . History of pneumococcal pneumonia 2006   bilateral pneumonia w/ left lower lobe abscess treated w/ chest tube with suction for drainage  . History of pneumothorax    1984--  left spontaneous pneumothorax ,  treated w/ chest tube  . Hypertension   . Malignant neoplasm of prostate [facility]) urologist-  dr dahlstedt/  oncologist -- dr Tammi Klippel   dx 12-02-2016-- Stage T2b, Gleason 3+4,  PSA 5.99,  vol 25cc  . Numbness of left foot    outer left ankle numb-- per pt has appt. w/ neurologist  . OSA (obstructive sleep apnea)    per pt has not used cpap over year ago from 04-16-2017--- per study 01-17-2010  severe osa  . Wears glasses     PAST SURGICAL HISTORY: Past Surgical History:  Procedure Laterality Date  . BIOPSY  02/02/2017   Procedure: BIOPSY;  Surgeon: Daneil Dolin, MD;  [facility];  Service: Endoscopy;;  duodenal bx's, esophgeal bx's  . BIOPSY   07/30/2017   Procedure: BIOPSY;  Surgeon: Daneil Dolin, MD;  [facility];  Service: Endoscopy;;  esophagus  . CARDIOVASCULAR STRESS TEST  09/12/2009   normal nuclear perfusion study w/ no ischemia /  normal LV function and wall motion , ef 57%  . COLONOSCOPY  2007   Dr. Gala Romney: hyperplastic polyps, internal hemorrhoids   . ESOPHAGOGASTRODUODENOSCOPY (EGD) WITH PROPOFOL N/A 02/02/2017   Procedure: ESOPHAGOGASTRODUODENOSCOPY (EGD) WITH PROPOFOL;  Surgeon: Daneil Dolin, MD;  [facility];  Service: Endoscopy;  Laterality: N/A;  2:00pm  . ESOPHAGOGASTRODUODENOSCOPY (EGD) WITH PROPOFOL N/A 07/30/2017   Procedure: ESOPHAGOGASTRODUODENOSCOPY (EGD) WITH PROPOFOL;  Surgeon: Daneil Dolin, MD;  [facility];  Service: Endoscopy;  Laterality: N/A;  7:30am  . HEMORRHOID SURGERY  01-21-2008    Forestine Na  . MALONEY DILATION N/A  02/02/2017   Procedure: Venia Minks DILATION;  Surgeon: Daneil Dolin, MD;  [facility];  Service: Endoscopy;  Laterality: N/A;  . Venia Minks DILATION N/A 07/30/2017   Procedure: Venia Minks DILATION;  Surgeon: Daneil Dolin, MD;  [facility];  Service: Endoscopy;  Laterality: N/A;  . RADIOACTIVE SEED IMPLANT N/A 04/23/2017   Procedure: RADIOACTIVE SEED IMPLANT/BRACHYTHERAPY IMPLANT;  Surgeon: Franchot Gallo, MD;  Location: [facility];  Service: Urology;  Laterality: N/A;  . SPACE OAR INSTILLATION N/A 04/23/2017   Procedure: SPACE OAR INSTILLATION;  Surgeon: Franchot Gallo, MD;  [facility];  Service: Urology;  Laterality: N/A;  . TRANSTHORACIC ECHOCARDIOGRAM  03/28/2009   mild LVH, ef 55-60%/ mild aorta calcification  . VIDEO ASSISTED THORACOSCOPY (VATS)/DECORTICATION Left     FAMILY HISTORY: Family History  Problem Relation Age of Onset  . Cancer Neg Hx   . Colon cancer Neg Hx   . Colon polyps Neg Hx   . Neuropathy Neg Hx     SOCIAL HISTORY: Social History    Socioeconomic History  . Marital status: Married    Spouse name: Not on file  . Number of children: Not on file  . Years of education: Not on file  . Highest education level: Not on file  Social Needs  . Financial resource strain: Not on file  . Food insecurity - worry: Not on file  . Food insecurity - inability: Not on file  . Transportation needs - medical: Not on file  . Transportation needs - non-medical: Not on file  Occupational History  . Not on file  Tobacco Use  . Smoking status: Current Every Day Smoker    Packs/day: 1.00    Years: 30.00    Pack years: 30.00    Types: Cigarettes, E-cigarettes    Last attempt to quit: 11/03/2015    Years since quitting: 1.9  . Smokeless tobacco: Never Used  Substance and Sexual Activity  . Alcohol use: Yes    Alcohol/week: 12.6 oz    Types: 21 Shots of liquor per week    Comment: 2-3 drinks daily  . Drug use: No  . Sexual activity: Not on file  Other Topics Concern  . Not on file  Social History Narrative  . Not on file     PHYSICAL EXAM  Vitals:   09/29/17 0817  BP: (!) 146/96  Pulse: 79  Weight: 228 lb 8 oz (103.6 kg)  Height: 6\' 2"  (1.88 m)   Body mass index is 29.34 kg/m.  Generalized: Well developed, in no acute distress  Head: normocephalic and atraumatic,. Oropharynx benign  Neck: Supple,  Musculoskeletal: No deformity   Neurological examination   Mentation: Alert oriented to time, place, history taking. Attention span and concentration appropriate. Recent and remote memory intact.  Follows all commands speech and language fluent.   Cranial nerve II-XII: .Pupils were equal round reactive to light extraocular movements were full, visual field were full on confrontational test. Facial sensation and strength were normal. hearing was intact to finger rubbing bilaterally. Uvula tongue midline. head turning and shoulder shrug were normal and symmetric.Tongue protrusion into cheek strength was normal. Motor:  normal bulk and tone, full strength in the BUE, BLE,  Sensory: normal and symmetric to light touch,  Coordination: finger-nose-finger, heel-to-shin bilaterally, no dysmetria Gait and Station: Rising up from seated position without assistance, normal stance,  moderate stride, good arm swing, smooth turning, able to perform tiptoe, and heel walking without difficulty.  Tandem gait is steady  DIAGNOSTIC DATA (LABS, IMAGING, TESTING) - I reviewed patient records, labs, notes, testing and imaging myself where available.  Lab Results  Component Value Date   WBC 6.7 06/23/2017   HGB 15.1 06/23/2017   HCT 45.9 06/23/2017   MCV 96.8 06/23/2017   PLT 165 06/23/2017      Component Value Date/Time   NA 141 06/23/2017 0045   K 4.0 06/23/2017 0045   CL 107 06/23/2017 0045   CO2 25 06/23/2017 0045   GLUCOSE 99 06/23/2017 0045   BUN 13 06/23/2017 0045   CREATININE 0.80 06/23/2017 0045   CALCIUM 9.6 06/23/2017 0045   PROT 7.4 06/23/2017 0045   ALBUMIN 3.9 06/23/2017 0045   AST 31 06/23/2017 0045   ALT 31 06/23/2017 0045   ALKPHOS 52 06/23/2017 0045   BILITOT 0.8 06/23/2017 0045   GFRNONAA >60 06/23/2017 0045   GFRAA >60 06/23/2017 0045   Lab Results  Component Value Date   CHOL 180 11/13/2011   HDL 62 11/13/2011   LDLCALC 99 11/13/2011   TRIG 95 11/13/2011   CHOLHDL 2.9 11/13/2011    Lab Results  Component Value Date   TSH 1.34 09/12/2009      ASSESSMENT AND PLAN  62 y.o. year old male here to follow-up for newly diagnosed , OSA (obstructive sleep apnea), with initial CPAP compliance.Data dated 08/30/2017 to 25 2019 shows compliance greater than 4 hours at 83%.  Average usage 5 hours 48 minutes.  Set pressure 5-18 cm.  EPR level 3 AHI 4.4 95th percentile leak at 14.3.   PLAN: CPAP compliance 83% Go to equipment company for mask fitting due to leak F/U in 3 months for repeat compliance check Dennie Bible, [facility], [facility], APRN  [facility] [address]  [place], [place] [address] [phone]

## 2017-09-29 ENCOUNTER — Ambulatory Visit (INDEPENDENT_AMBULATORY_CARE_PROVIDER_SITE_OTHER): Payer: 59 | Admitting: Nurse Practitioner

## 2017-09-29 ENCOUNTER — Encounter: Payer: Self-pay | Admitting: Nurse Practitioner

## 2017-09-29 DIAGNOSIS — G4733 Obstructive sleep apnea (adult) (pediatric): Secondary | ICD-10-CM | POA: Diagnosis not present

## 2017-09-29 DIAGNOSIS — Z9989 Dependence on other enabling machines and devices: Secondary | ICD-10-CM | POA: Diagnosis not present

## 2017-09-29 NOTE — Patient Instructions (Signed)
CPAP compliance 83% Go to equipment company for mask fitting F/U in 3 months for repeat compliance check

## 2017-09-29 NOTE — Progress Notes (Signed)
I agree with the assessment and plan as directed by NP .The patient is known to me .   Dora Simeone, MD  

## 2017-10-27 ENCOUNTER — Ambulatory Visit (INDEPENDENT_AMBULATORY_CARE_PROVIDER_SITE_OTHER): Payer: 59 | Admitting: Urology

## 2017-10-27 DIAGNOSIS — C61 Malignant neoplasm of prostate: Secondary | ICD-10-CM

## 2017-10-27 DIAGNOSIS — N401 Enlarged prostate with lower urinary tract symptoms: Secondary | ICD-10-CM

## 2017-10-27 DIAGNOSIS — R350 Frequency of micturition: Secondary | ICD-10-CM | POA: Diagnosis not present

## 2017-12-29 NOTE — Progress Notes (Signed)
[name] DOB: 11/17/1955   REASON FOR VISIT: Obstructive sleep apnea here for  CPAP compliance HISTORY FROM: Patient    HISTORY OF PRESENT ILLNESS:UPDATE 5/29/2019[name], 62 year old male returns for follow-up with history of obstructive sleep apnea here for CPAP compliance.  He denies any problems with his CPAP.  CPAP data dated 11/29/2017-12/28/2017 shows greater than 4 hours 87%.  Average usage 5 hours 47 minutes.  Set pressure 5 to 18 cm.  EPR level 3 AHI 3.4 leaks 95th percentile at 10.5.  He returns for reevaluation   UPDATE 2/26/2019[name], 62 year old male returns for follow-up with history of newly diagnosed obstructive sleep apnea.  He is here for his initial CPAP compliance.  He states he had a period of time to get adjusted to the CPAP but he feels he is adjusted now.  He complains of his mask leaking at times even though it is tight.  He does have a beard.  Compliance data dated 08/30/2017 to 25 2019 shows compliance greater than 4 hours at 83%.  Average usage 5 hours 48 minutes.  Set pressure 5-18 cm.  EPR level 3 AHI 4.4 95th percentile leak at 14.3.  He returns for reevaluation 06/02/17/[name] is a 62 y.o. male , seen here as in a referral   from Dr. Karie Kirks for sleep apnea. [name] medical history is positive for a lung abscess in the year 2008, a pneumothorax in 1983, bleeding hemorrhoids in 2010, surgical treatment. Shoulder surgery 2008 on  both sides- Not related to the rotator cuff. Prostate cancer with seed implants earlier this year 2018. The procedure was just done on September 20. [name] has also seen my colleague Dr. Sarina Ill.   [name] presents today mainly because of his loud snoring, which has been brought to his attention by his family and friends. He can also fall asleep anywhere anytime. He reported an anicteric of falling asleep by being out on a fishing boat in the [place] of  [place]. He is fatigued.  He falls asleep watching TV and his family leaves the room- they can not hear the movies plot.    Sleep habits are as follows: Usually the patient spends the last hour before going to bed either watching TV or reading, and often falling asleep during it. He sleeps in a fetal position on his left side- on 2 pillows. The bedroom is cool, quiet and dark.  Sometimes he will stay put in his den for the whole night. He will usually transfer to the bedroom before midnight, and has no trouble falling asleep in his bed again. Since he had the prostate, the treatment he developed nocturia,goes now to the bathroom for about 3-4 times each night. This was not the case prior. Usually nocturia happens every 2 hours.Occasionally he will also wake himself choking or short of air, and with the feeling of an extremely dry mouth. His snoring has been described as thunderous, and his breathing is irregular. His wife has noted him always moving his legs at night- a condition that was already known in childhood. Has not kept him from sleeping.  He typically rises at 4 or 5 AM in the morning, waking up spontaneously without alarm.  REVIEW OF SYSTEMS: Full 14 system review of systems performed and notable only for those listed, all others are neg:  Constitutional: neg  Cardiovascular: neg Ear/Nose/Throat: neg  Skin: neg Eyes: neg Respiratory: neg Gastroitestinal: neg  Hematology/Lymphatic: neg  Endocrine: neg Musculoskeletal: Back pain Allergy/Immunology: neg Neurological: neg Psychiatric: neg Sleep : Obstructive sleep apnea with CPAP   ALLERGIES: No Known Allergies  HOME MEDICATIONS: Outpatient Medications Prior to Visit  Medication Sig Dispense Refill  . albuterol (ACCUNEB) 0.63 MG/3ML nebulizer solution Take 1 ampule by nebulization 2 (two) times daily.    Marland Kitchen ibuprofen (ADVIL,MOTRIN) 200 MG tablet Take 400 mg by mouth daily as needed for mild pain.    . SYMBICORT 160-4.5  MCG/ACT inhaler INHALE 2 PUFFS BY MOUTH TWICE DAILY  FOR COPD  11  . diphenhydrAMINE (BENADRYL) 25 MG tablet Take 1 tablet (25 mg total) every 6 (six) hours by mouth. (Patient not taking: Reported on 12/30/2017) 20 tablet 0  . ipratropium-albuterol (DUONEB) 0.5-2.5 (3) MG/3ML SOLN Take 3 mLs by nebulization every 6 (six) hours as needed (shortness of breath).      No facility-administered medications prior to visit.     PAST MEDICAL HISTORY: Past Medical History:  Diagnosis Date  . Bilateral shoulder bursitis   . COPD (chronic obstructive pulmonary disease) with emphysema ([place])   . DDD (degenerative disc disease), lumbar    hx epideral injection  L5--S1   . Dysphasia    intermittant w/ food   . Dyspnea on exertion   . GERD (gastroesophageal reflux disease)   . Hiatal hernia   . History of acute pancreatitis    alcoholic pancreatitis [id number] and 01-30-2012  . History of colon polyps    10-17-2005  hyperplastic polyp's  . History of esophageal stricture    s/p  dilation 02-02-2017  . History of peptic ulcer 1990s  . History of pneumococcal pneumonia 2006   bilateral pneumonia w/ left lower lobe abscess treated w/ chest tube with suction for drainage  . History of pneumothorax    1984--  left spontaneous pneumothorax ,  treated w/ chest tube  . Hypertension   . Malignant neoplasm of prostate [facility]) urologist-  dr dahlstedt/  oncologist -- dr Tammi Klippel   dx 12-02-2016-- Stage T2b, Gleason 3+4,  PSA 5.99,  vol 25cc  . Numbness of left foot    outer left ankle numb-- per pt has appt. w/ neurologist  . OSA (obstructive sleep apnea)    per pt has not used cpap over year ago from 04-16-2017--- per study 01-17-2010  severe osa  . Wears glasses     PAST SURGICAL HISTORY: Past Surgical History:  Procedure Laterality Date  . BIOPSY  02/02/2017   Procedure: BIOPSY;  Surgeon: Daneil Dolin, MD;  [facility];  Service: Endoscopy;;  duodenal bx's, esophgeal bx's  . BIOPSY   07/30/2017   Procedure: BIOPSY;  Surgeon: Daneil Dolin, MD;  [facility];  Service: Endoscopy;;  esophagus  . CARDIOVASCULAR STRESS TEST  09/12/2009   normal nuclear perfusion study w/ no ischemia /  normal LV function and wall motion , ef 57%  . COLONOSCOPY  2007   Dr. Gala Romney: hyperplastic polyps, internal hemorrhoids   . ESOPHAGOGASTRODUODENOSCOPY (EGD) WITH PROPOFOL N/A 02/02/2017   Procedure: ESOPHAGOGASTRODUODENOSCOPY (EGD) WITH PROPOFOL;  Surgeon: Daneil Dolin, MD;  [facility];  Service: Endoscopy;  Laterality: N/A;  2:00pm  . ESOPHAGOGASTRODUODENOSCOPY (EGD) WITH PROPOFOL N/A 07/30/2017   Procedure: ESOPHAGOGASTRODUODENOSCOPY (EGD) WITH PROPOFOL;  Surgeon: Daneil Dolin, MD;  [facility];  Service: Endoscopy;  Laterality: N/A;  7:30am  . HEMORRHOID SURGERY  01-21-2008    [facility]ine Na  . MALONEY DILATION N/A 02/02/2017  Procedure: MALONEY DILATION;  Surgeon: Daneil Dolin, MD;  [facility];  Service: Endoscopy;  Laterality: N/A;  . Venia Minks DILATION N/A 07/30/2017   Procedure: Venia Minks DILATION;  Surgeon: Daneil Dolin, MD;  [facility];  Service: Endoscopy;  Laterality: N/A;  . RADIOACTIVE SEED IMPLANT N/A 04/23/2017   Procedure: RADIOACTIVE SEED IMPLANT/BRACHYTHERAPY IMPLANT;  Surgeon: Franchot Gallo, MD;  [facility];  Service: Urology;  Laterality: N/A;  . SPACE OAR INSTILLATION N/A 04/23/2017   Procedure: SPACE OAR INSTILLATION;  Surgeon: Franchot Gallo, MD;  Location[facility];  Service: Urology;  Laterality: N/A;  . TRANSTHORACIC ECHOCARDIOGRAM  03/28/2009   mild LVH, ef 55-60%/ mild aorta calcification  . VIDEO ASSISTED THORACOSCOPY (VATS)/DECORTICATION Left     FAMILY HISTORY: Family History  Problem Relation Age of Onset  . Cancer Neg Hx   . Colon cancer Neg Hx   . Colon polyps Neg Hx   . Neuropathy Neg Hx     SOCIAL HISTORY: Social History    Socioeconomic History  . Marital status: Married    Spouse name: Not on file  . Number of children: Not on file  . Years of education: Not on file  . Highest education level: Not on file  Occupational History  . Not on file  Social Needs  . Financial resource strain: Not on file  . Food insecurity:    Worry: Not on file    Inability: Not on file  . Transportation needs:    Medical: Not on file    Non-medical: Not on file  Tobacco Use  . Smoking status: Former Smoker    Packs/day: 1.00    Years: 30.00    Pack years: 30.00    Types: Cigarettes, E-cigarettes    Last attempt to quit: 10/03/2015    Years since quitting: 2.2  . Smokeless tobacco: Never Used  Substance and Sexual Activity  . Alcohol use: Yes    Alcohol/week: 12.6 oz    Types: 21 Shots of liquor per week    Comment: 2-3 drinks daily  . Drug use: No  . Sexual activity: Not on file  Lifestyle  . Physical activity:    Days per week: Not on file    Minutes per session: Not on file  . Stress: Not on file  Relationships  . Social connections:    Talks on phone: Not on file    Gets together: Not on file    Attends religious service: Not on file    Active member of club or organization: Not on file    Attends meetings of clubs or organizations: Not on file    Relationship status: Not on file  . Intimate partner violence:    Fear of current or ex partner: Not on file    Emotionally abused: Not on file    Physically abused: Not on file    Forced sexual activity: Not on file  Other Topics Concern  . Not on file  Social History Narrative  . Not on file     PHYSICAL EXAM  Vitals:   12/30/17 0823  BP: 129/77  Pulse: 89  Weight: 230 lb (104.3 kg)  Height: 6\' 2"  (1.88 m)   Body mass index is 29.53 kg/m.  Generalized: Well developed, in no acute distress  Head: normocephalic and atraumatic,. Oropharynx benign malopatti 5 Neck: Supple, 18.5 Musculoskeletal: No deformity   Neurological examination    Mentation: Alert oriented to time, place,  history taking. Attention span and concentration appropriate. Recent and remote memory intact.  Follows all commands speech and language fluent.   Cranial nerve II-XII: .Pupils were equal round reactive to light extraocular movements were full, visual field were full on confrontational test. Facial sensation and strength were normal. hearing was intact to finger rubbing bilaterally. Uvula tongue midline. head turning and shoulder shrug were normal and symmetric.Tongue protrusion into cheek strength was normal. Motor: normal bulk and tone, full strength in the BUE, BLE,  Sensory: normal and symmetric to light touch,  Coordination: finger-nose-finger, heel-to-shin bilaterally, no dysmetria Gait and Station: Rising up from seated position without assistance, normal stance,  moderate stride, good arm swing, smooth turning, able to perform tiptoe, and heel walking without difficulty. Tandem gait is steady  DIAGNOSTIC DATA (LABS, IMAGING, TESTING) - I reviewed patient records, labs, notes, testing and imaging myself where available.  Lab Results  Component Value Date   WBC 6.7 06/23/2017   HGB 15.1 06/23/2017   HCT 45.9 06/23/2017   MCV 96.8 06/23/2017   PLT 165 06/23/2017      Component Value Date/Time   NA 141 06/23/2017 0045   K 4.0 06/23/2017 0045   CL 107 06/23/2017 0045   CO2 25 06/23/2017 0045   GLUCOSE 99 06/23/2017 0045   BUN 13 06/23/2017 0045   CREATININE 0.80 06/23/2017 0045   CALCIUM 9.6 06/23/2017 0045   PROT 7.4 06/23/2017 0045   ALBUMIN 3.9 06/23/2017 0045   AST 31 06/23/2017 0045   ALT 31 06/23/2017 0045   ALKPHOS 52 06/23/2017 0045   BILITOT 0.8 06/23/2017 0045   GFRNONAA >60 06/23/2017 0045   GFRAA >60 06/23/2017 0045   Lab Results  Component Value Date   CHOL 180 11/13/2011   HDL 62 11/13/2011   LDLCALC 99 11/13/2011   TRIG 95 11/13/2011   CHOLHDL 2.9 11/13/2011    Lab Results  Component Value Date   TSH 1.34  09/12/2009      ASSESSMENT AND PLAN  62 y.o. year old male here to follow-up for  OSA (obstructive sleep apnea), with  CPAP compliance.Data  dated 11/29/2017-12/28/2017 shows greater than 4 hours 87%.  Average usage 5 hours 47 minutes.  Set pressure 5 to 18 cm.  EPR level 3 AHI 3.4   PLAN: CPAP compliance 87% Continue same settings F/U in 6 months for repeat compliance check then yearly Dennie Bible, [facility], [facility], [facility] [address] [place], [place] [address] [phone]

## 2017-12-30 ENCOUNTER — Ambulatory Visit (INDEPENDENT_AMBULATORY_CARE_PROVIDER_SITE_OTHER): Payer: 59 | Admitting: Nurse Practitioner

## 2017-12-30 ENCOUNTER — Encounter: Payer: Self-pay | Admitting: Nurse Practitioner

## 2017-12-30 VITALS — BP 129/77 | HR 89 | Ht 74.0 in | Wt 230.0 lb

## 2017-12-30 DIAGNOSIS — G4733 Obstructive sleep apnea (adult) (pediatric): Secondary | ICD-10-CM

## 2017-12-30 DIAGNOSIS — Z9989 Dependence on other enabling machines and devices: Secondary | ICD-10-CM | POA: Diagnosis not present

## 2017-12-30 NOTE — Patient Instructions (Signed)
CPAP compliance 87% Continue same settings F/U in 6 months for repeat compliance check then yearly

## 2018-01-01 NOTE — Progress Notes (Signed)
I agree with the assessment and plan as directed by NP .The patient is known to me .   Khale Nigh, MD  

## 2018-02-23 ENCOUNTER — Ambulatory Visit (INDEPENDENT_AMBULATORY_CARE_PROVIDER_SITE_OTHER): Payer: 59 | Admitting: Urology

## 2018-02-23 DIAGNOSIS — N5201 Erectile dysfunction due to arterial insufficiency: Secondary | ICD-10-CM | POA: Diagnosis not present

## 2018-02-23 DIAGNOSIS — C61 Malignant neoplasm of prostate: Secondary | ICD-10-CM

## 2018-03-01 ENCOUNTER — Other Ambulatory Visit (HOSPITAL_COMMUNITY): Payer: Self-pay | Admitting: Nurse Practitioner

## 2018-03-01 DIAGNOSIS — Z122 Encounter for screening for malignant neoplasm of respiratory organs: Secondary | ICD-10-CM

## 2018-03-18 ENCOUNTER — Ambulatory Visit (HOSPITAL_COMMUNITY)
Admission: RE | Admit: 2018-03-18 | Discharge: 2018-03-18 | Disposition: A | Discharge status: Home / Self Care | Payer: 59 | Source: Ambulatory Visit | Attending: Nurse Practitioner | Admitting: Nurse Practitioner

## 2018-03-18 DIAGNOSIS — I700 Atherosclerosis of aorta: Secondary | ICD-10-CM | POA: Insufficient documentation

## 2018-03-18 DIAGNOSIS — I2510 Atherosclerotic heart disease of native coronary artery without angina pectoris: Secondary | ICD-10-CM | POA: Insufficient documentation

## 2018-03-18 DIAGNOSIS — Z122 Encounter for screening for malignant neoplasm of respiratory organs: Secondary | ICD-10-CM | POA: Diagnosis present

## 2018-03-18 DIAGNOSIS — J439 Emphysema, unspecified: Secondary | ICD-10-CM | POA: Insufficient documentation

## 2018-03-21 NOTE — Progress Notes (Signed)
Synopsis: Referred in August 2019 for COPD, RUL pulmonary nodule by [name], MD  Subjective:   PATIENT ID: [name] GENDER: male DOB: 12-21-1955, MRN: [id number]  Chief Complaint  Patient presents with  . Pulmonary Consult    referred by Dr. Karie Kirks for COPD.    Patient has a past medical history of obstructive sleep apnea, was found to have sleep apnea on prior PSG by Dr. Ellwood Sayers in the ER.  He has a history of prostate cancer as well as tobacco abuse and has been labeled with COPD in the past.  No prior PFTs.  He was recently seen by his PCP with complaints of worsening shortness of breath, 35-pack-year history of smoking, quit smoking 2 years ago, started vaping but recently quit vaping approximately 3 to 4 months ago.  He does have shortness of breath with exertion.  He is active outside, plays golf regularly and swims. He is playing in an amateur handicap golf tournament starting this Wednesday.   He gets short winded, SOB and DOE with any exertion. He feels like he has had to slow down due to his symptoms. 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 He has noticed people asking is he is "ok" are you breathing okay situation. He has a had a few episodes where he was seen in the ED. He was at work and couldn't get his breath and he was transported from work.  He has never been intubated or placed on mechanical ventilation.  He has never needed NIPPV for an exacerbation.      Past Medical History:  Diagnosis Date  . Bilateral shoulder bursitis   . COPD (chronic obstructive pulmonary disease) with emphysema ([place])   . DDD (degenerative disc disease), lumbar    hx epideral injection  L5--S1   . Dysphasia    intermittant w/ food   . Dyspnea on exertion   . GERD (gastroesophageal reflux disease)   . Hiatal hernia   . History of acute pancreatitis    alcoholic pancreatitis [id number] and 01-30-2012  . History of colon polyps    10-17-2005   hyperplastic polyp's  . History of esophageal stricture    s/p  dilation 02-02-2017  . History of peptic ulcer 1990s  . History of pneumococcal pneumonia 2006   bilateral pneumonia w/ left lower lobe abscess treated w/ chest tube with suction for drainage  . History of pneumothorax    1984--  left spontaneous pneumothorax ,  treated w/ chest tube  . Hypertension   . Malignant neoplasm of prostate [facility]) urologist-  dr dahlstedt/  oncologist -- dr Tammi Klippel   dx 12-02-2016-- Stage T2b, Gleason 3+4,  PSA 5.99,  vol 25cc  . Numbness of left foot    outer left ankle numb-- per pt has appt. w/ neurologist  . OSA (obstructive sleep apnea)    per pt has not used cpap over year ago from 04-16-2017--- per study 01-17-2010  severe osa  . Wears glasses      Family History  Problem Relation Age of Onset  . Cancer Neg Hx   . Colon cancer Neg Hx   . Colon polyps Neg Hx   . Neuropathy Neg Hx      Social History   Socioeconomic History  . Marital status: Married    Spouse name: Not on file  . Number of children: Not on file  . Years of education: Not on file  . Highest education  level: Not on file  Occupational History  . Not on file  Social [name]  . Financial resource strain: Not on file  . Food insecurity:    Worry: Not on file    Inability: Not on file  . Transportation [name]:    Medical: Not on file    Non-medical: Not on file  Tobacco Use  . Smoking status: Former Smoker    Packs/day: 1.00    Years: 30.00    Pack years: 30.00    Types: Cigarettes, E-cigarettes    Last attempt to quit: 10/03/2015    Years since quitting: 2.4  . Smokeless tobacco: Never Used  Substance and Sexual Activity  . Alcohol use: Yes    Alcohol/week: 21.0 standard drinks    Types: 21 Shots of liquor per week    Comment: 2-3 drinks daily  . Drug use: No  . Sexual activity: Not on file  Lifestyle  . Physical activity:    Days per week: Not on file    Minutes per session: Not on file  . Stress: Not  on file  Relationships  . Social connections:    Talks on phone: Not on file    Gets together: Not on file    Attends religious service: Not on file    Active member of club or organization: Not on file    Attends meetings of clubs or organizations: Not on file    Relationship status: Not on file  . Intimate partner violence:    Fear of current or ex partner: Not on file    Emotionally abused: Not on file    Physically abused: Not on file    Forced sexual activity: Not on file  Other Topics Concern  . Not on file  Social History Narrative  . Not on file     No Known Allergies   Outpatient Medications Prior to Visit  Medication Sig Dispense Refill  . ibuprofen (ADVIL,MOTRIN) 200 MG tablet Take 400 mg by mouth daily as needed for mild pain.    . naproxen (NAPROSYN) 500 MG tablet Take 500 mg by mouth 2 (two) times daily with a meal.    . SYMBICORT 160-4.5 MCG/ACT inhaler INHALE 2 PUFFS BY MOUTH TWICE DAILY  FOR COPD  11  . tamsulosin (FLOMAX) 0.4 MG CAPS capsule Take 0.4 mg by mouth daily.    . Ipratropium-Albuterol (COMBIVENT RESPIMAT) 20-100 MCG/ACT AERS respimat Inhale 1 puff into the lungs every 6 (six) hours.    Marland Kitchen ipratropium-albuterol (DUONEB) 0.5-2.5 (3) MG/3ML SOLN Take 3 mLs by nebulization every 6 (six) hours as needed (shortness of breath).     Marland Kitchen albuterol (ACCUNEB) 0.63 MG/3ML nebulizer solution Take 1 ampule by nebulization 2 (two) times daily.    . diphenhydrAMINE (BENADRYL) 25 MG tablet Take 1 tablet (25 mg total) every 6 (six) hours by mouth. (Patient not taking: Reported on 12/30/2017) 20 tablet 0   No facility-administered medications prior to visit.     Review of Systems  Constitutional: Negative.   HENT: Negative.   Eyes:       Tearful, watering   Respiratory: Positive for cough, shortness of breath and wheezing.   Cardiovascular: Negative.   Gastrointestinal: Negative.   Genitourinary: Positive for frequency and urgency.  Musculoskeletal: Negative.     Skin: Negative.   Neurological: Negative.   Endo/Heme/Allergies: Negative.   Psychiatric/Behavioral: Negative.      Objective:  Physical Exam  Constitutional: He is oriented to person, place, and time. He  appears well-developed and well-nourished. No distress.  HENT:  Head: Normocephalic and atraumatic.  Mouth/Throat: Oropharynx is clear and moist.  Eyes: Pupils are equal, round, and reactive to light. Conjunctivae are normal. No scleral icterus.  Neck: Neck supple. No JVD present. No tracheal deviation present.  Cardiovascular: Normal rate, regular rhythm, normal heart sounds and intact distal pulses.  No murmur heard. Pulmonary/Chest: Effort normal. No accessory muscle usage or stridor. No tachypnea. No respiratory distress. He has no wheezes. He has no rhonchi. He has no rales.  Diminished breath sounds in the bilateral apices and upper lobe region.  Abdominal: Soft. Bowel sounds are normal. He exhibits no distension. There is no tenderness.  Musculoskeletal: He exhibits no edema or tenderness.  Lymphadenopathy:    He has no cervical adenopathy.  Neurological: He is alert and oriented to person, place, and time.  Skin: Skin is warm and dry. Capillary refill takes less than 2 seconds. No rash noted.  Psychiatric: He has a normal mood and affect. His behavior is normal.  Vitals reviewed.   Vitals:   03/22/18 0950  BP: 138/82  Pulse: 83  SpO2: 95%  Weight: 234 lb (106.1 kg)  Height: 6\' 2"  (1.88 m)   95% on RA BMI Readings from Last 3 Encounters:  03/22/18 30.04 kg/m  12/30/17 29.53 kg/m  09/29/17 29.34 kg/m   Wt Readings from Last 3 Encounters:  03/22/18 234 lb (106.1 kg)  12/30/17 230 lb (104.3 kg)  09/29/17 228 lb 8 oz (103.6 kg)     CBC    Component Value Date/Time   WBC 6.7 06/23/2017 0045   RBC 4.74 06/23/2017 0045   HGB 15.1 06/23/2017 0045   HCT 45.9 06/23/2017 0045   PLT 165 06/23/2017 0045   MCV 96.8 06/23/2017 0045   MCH 31.9 06/23/2017 0045    MCHC 32.9 06/23/2017 0045   RDW 13.3 06/23/2017 0045   LYMPHSABS 2.4 06/23/2017 0045   MONOABS 0.9 06/23/2017 0045   EOSABS 0.4 06/23/2017 0045   BASOSABS 0.0 06/23/2017 0045     Chest Imaging: LDCT 03/2018 - RUL Lung nodule, approximately 32mm with areas of surrounding scar, significant BL upper lobe predominant emphysema The patient's images have been independently reviewed by me.    Pulmonary Functions Testing Results: No results found for: FEV1, FVC, FEV1FVC, TLC, DLCO No prior PFTs on file  FeNO: None  Pathology: None   Echocardiogram: None   Heart Catheterization: None     Assessment & Plan:   Incidental lung nodule, greater than or equal to 71mm - Plan: Pulmonary Function Test  Nodule of upper lobe of right lung - Plan: Pulmonary Function Test  Centrilobular emphysema ([name]) - Plan: Pulmonary Function Test  Former smoker - Plan: Pulmonary Function Test  Abnormal findings on diagnostic imaging of lung - Plan: NM PET Image Initial (PI) Skull Base To Thigh, Pulmonary Function Test  Discussion:  High risk RUL lung nodule, Mayo Clinic Model, SPN Malignancy Risk Calculator with a probability >60% of being a malignancy due to history, age, size, location and associated emphysema.  He does have probable COPD at baseline.  No prior PFTs on file.  CT imaging consistent with upper lobe predominant centrilobular emphysema.  Recommend NM PET for evaluation of potential biopsy locations and evaluation of potential metastatic foci and possible candidacy for resection.   He will need FULL PFTs completed in the mean time. Once these are completed he will need to be seen by thoracic surgery pending NM PET results.  With the emphysema seen on LDCT I suspect he will have a low DLCO. Therefore, may need quantitative V/Q during pre-surgical work up.   I do suspect he has COPD based on imaging and history. He can continue his ICS/LABA and prn SABA. We will make changes to his inhaler  regimen today.  Due to his history of prostate cancer and chronic hesitancy and urinary retention issues we will discontinue his ipratropium.  Stop Combivent, stop duo nebs, start PRN albuterol nebulizer, start albuterol HFA as needed for shortness of breath.  Patient given samples of Spiriva Respimat 2.52 puffs once daily.  He can continue Symbicort 162 puffs twice daily however will add spacer.   Current Outpatient Medications:  .  ibuprofen (ADVIL,MOTRIN) 200 MG tablet, Take 400 mg by mouth daily as needed for mild pain., Disp: , Rfl:  .  naproxen (NAPROSYN) 500 MG tablet, Take 500 mg by mouth 2 (two) times daily with a meal., Disp: , Rfl:  .  SYMBICORT 160-4.5 MCG/ACT inhaler, INHALE 2 PUFFS BY MOUTH TWICE DAILY  FOR COPD, Disp: , Rfl: 11 .  tamsulosin (FLOMAX) 0.4 MG CAPS capsule, Take 0.4 mg by mouth daily., Disp: , Rfl:    Garner Nash, DO [facility] 03/22/2018 10:37 AM

## 2018-03-22 ENCOUNTER — Encounter: Payer: Self-pay | Admitting: Pulmonary Disease

## 2018-03-22 ENCOUNTER — Ambulatory Visit (INDEPENDENT_AMBULATORY_CARE_PROVIDER_SITE_OTHER): Payer: 59 | Admitting: Pulmonary Disease

## 2018-03-22 VITALS — BP 138/82 | HR 83 | Ht 74.0 in | Wt 234.0 lb

## 2018-03-22 DIAGNOSIS — J432 Centrilobular emphysema: Secondary | ICD-10-CM | POA: Diagnosis not present

## 2018-03-22 DIAGNOSIS — R911 Solitary pulmonary nodule: Secondary | ICD-10-CM

## 2018-03-22 DIAGNOSIS — R3911 Hesitancy of micturition: Secondary | ICD-10-CM

## 2018-03-22 DIAGNOSIS — R918 Other nonspecific abnormal finding of lung field: Secondary | ICD-10-CM

## 2018-03-22 DIAGNOSIS — Z87891 Personal history of nicotine dependence: Secondary | ICD-10-CM | POA: Diagnosis not present

## 2018-03-22 DIAGNOSIS — Z8546 Personal history of malignant neoplasm of prostate: Secondary | ICD-10-CM

## 2018-03-22 LAB — PULMONARY FUNCTION TEST
DL/VA % pred: 69 %
DL/VA: 3.29 ml/min/mmHg/L
DLCO UNC % PRED: 52 %
DLCO UNC: 18.65 ml/min/mmHg
FEF 25-75 Post: 1.56 L/sec
FEF 25-75 Pre: 1.09 L/sec
FEF2575-%Change-Post: 43 %
FEF2575-%PRED-PRE: 34 %
FEF2575-%Pred-Post: 49 %
FEV1-%Change-Post: 11 %
FEV1-%PRED-POST: 60 %
FEV1-%Pred-Pre: 54 %
FEV1-PRE: 2.12 L
FEV1-Post: 2.36 L
FEV1FVC-%Change-Post: 7 %
FEV1FVC-%Pred-Pre: 85 %
FEV6-%CHANGE-POST: 3 %
FEV6-%PRED-PRE: 65 %
FEV6-%Pred-Post: 68 %
FEV6-POST: 3.36 L
FEV6-Pre: 3.24 L
FEV6FVC-%CHANGE-POST: 0 %
FEV6FVC-%PRED-POST: 103 %
FEV6FVC-%Pred-Pre: 103 %
FVC-%Change-Post: 3 %
FVC-%Pred-Post: 65 %
FVC-%Pred-Pre: 63 %
FVC-Post: 3.4 L
FVC-Pre: 3.3 L
POST FEV6/FVC RATIO: 99 %
PRE FEV1/FVC RATIO: 64 %
Post FEV1/FVC ratio: 69 %
Pre FEV6/FVC Ratio: 98 %
RV % pred: 38 %
RV: 0.93 L
TLC % PRED: 62 %
TLC: 4.69 L

## 2018-03-22 MED ORDER — ALBUTEROL SULFATE HFA 108 (90 BASE) MCG/ACT IN AERS: 2.0000 | INHALATION_SPRAY | RESPIRATORY_TRACT | 5 refills | Status: DC | PRN

## 2018-03-22 MED ORDER — ALBUTEROL SULFATE (2.5 MG/3ML) 0.083% IN NEBU: 2.5000 mg | INHALATION_SOLUTION | Freq: Four times a day (QID) | RESPIRATORY_TRACT | 5 refills | Status: DC | PRN

## 2018-03-22 MED ORDER — TIOTROPIUM BROMIDE MONOHYDRATE 2.5 MCG/ACT IN AERS: 2.0000 | INHALATION_SPRAY | Freq: Every day | RESPIRATORY_TRACT | 0 refills | Status: DC

## 2018-03-22 MED ORDER — AEROCHAMBER MV MISC: 0 refills | Status: DC

## 2018-03-22 NOTE — Progress Notes (Signed)
PFT done today. 

## 2018-03-22 NOTE — Patient Instructions (Signed)
We will obtain PET imaging to evaluate the right upper lobe nodule. We will obtain full PFTs to establish baseline lung function Stop Combivent, stop ipratropium albuterol nebulizer Start as needed albuterol inhaler as well as as needed albuterol nebulizer Given samples today of Spiriva Respimat, use 2 puffs once daily.  Please let us know if you continue to have urinary symptoms. Use albuterol inhaler as well as Symbicort inhaler with spacer given today in the office.

## 2018-03-23 ENCOUNTER — Encounter (HOSPITAL_COMMUNITY)
Admission: RE | Admit: 2018-03-23 | Discharge: 2018-03-23 | Disposition: A | Discharge status: Home / Self Care | Payer: 59 | Source: Ambulatory Visit | Attending: Pulmonary Disease | Admitting: Pulmonary Disease

## 2018-03-23 DIAGNOSIS — R918 Other nonspecific abnormal finding of lung field: Secondary | ICD-10-CM | POA: Diagnosis present

## 2018-03-23 LAB — GLUCOSE, CAPILLARY: GLUCOSE-CAPILLARY: 114 mg/dL — AB (ref 70–99)

## 2018-03-23 MED ORDER — FLUDEOXYGLUCOSE F - 18 (FDG) INJECTION
11.6400 | Freq: Once | INTRAVENOUS | Status: AC
  Administered 2018-03-23: 11.64 via INTRAVENOUS

## 2018-03-24 ENCOUNTER — Telehealth: Payer: Self-pay | Admitting: Pulmonary Disease

## 2018-03-24 DIAGNOSIS — K5730 Diverticulosis of large intestine without perforation or abscess without bleeding: Secondary | ICD-10-CM

## 2018-03-24 DIAGNOSIS — K5792 Diverticulitis of intestine, part unspecified, without perforation or abscess without bleeding: Secondary | ICD-10-CM

## 2018-03-24 DIAGNOSIS — R918 Other nonspecific abnormal finding of lung field: Secondary | ICD-10-CM

## 2018-03-24 NOTE — Telephone Encounter (Signed)
Thanks, I tried calling him this morning. I routed it to [place] already.  BLI

## 2018-03-24 NOTE — Telephone Encounter (Signed)
Spoke with pt. He is aware of his results. Referral was placed for [facility] but the pt wants to stay with Dr. Guerry Minors office at [facility] per Colletta Maryland with [facility]. A new referral will be placed.

## 2018-03-24 NOTE — Telephone Encounter (Signed)
Notes recorded by Garner Nash, DO on 03/24/2018 at 8:10 AM EDT [name],  I attempted to call him this morning regarding his results there was no answer (I believe he is out of town for the next 5 days). The right upper lobe nodule looks ok but we will need to follow this up in 3 months. The imaging of also including some changes in his bowel/colon. We are going to need him to follow up with a gastroenterologist concerning this. I will be happy to discuss this further with him.  Thanks BLI ----------------------------------------------- Spoke with pt. He is aware of results. Pt will need to be referred to GI. Order has been placed. Nothing further was needed.

## 2018-03-24 NOTE — Addendum Note (Signed)
Addended by: Garner Nash on: 03/24/2018 12:55 PM   Modules accepted: Orders

## 2018-03-24 NOTE — Telephone Encounter (Signed)
Received call report from Ruth with [place] radiology for PET preformed 03/23/18 . Results are within epic.  Will route to [facility] for f/u.  IMPRESSION: 1. The right apical/upper lobe irregular density is not significantly hypermetabolic. Follow up low-dose chest CT without contrast in 3 months (please use the following order, "CT CHEST LCS NODULE FOLLOW-UP W/O CM") is recommended.

## 2018-03-25 ENCOUNTER — Encounter: Payer: Self-pay | Admitting: Internal Medicine

## 2018-03-31 ENCOUNTER — Other Ambulatory Visit: Payer: Self-pay | Admitting: Pulmonary Disease

## 2018-03-31 ENCOUNTER — Telehealth: Payer: Self-pay | Admitting: *Deleted

## 2018-03-31 DIAGNOSIS — K5730 Diverticulosis of large intestine without perforation or abscess without bleeding: Secondary | ICD-10-CM

## 2018-03-31 DIAGNOSIS — K5792 Diverticulitis of intestine, part unspecified, without perforation or abscess without bleeding: Secondary | ICD-10-CM

## 2018-03-31 NOTE — Addendum Note (Signed)
Addended by: Della Goo C on: 03/31/2018 03:51 PM   Modules accepted: Orders

## 2018-03-31 NOTE — Telephone Encounter (Signed)
Received a call from [place] with [facility]. Requested sooner appt with our office. We had a cancellation for tomorrow. I called patient but he reports he is out of this week and can't come in. He is scheduled for 04/13/18 at 11:30am.

## 2018-04-13 ENCOUNTER — Other Ambulatory Visit: Payer: Self-pay | Admitting: *Deleted

## 2018-04-13 ENCOUNTER — Encounter: Payer: Self-pay | Admitting: Gastroenterology

## 2018-04-13 ENCOUNTER — Telehealth: Payer: Self-pay | Admitting: *Deleted

## 2018-04-13 ENCOUNTER — Encounter: Payer: Self-pay | Admitting: *Deleted

## 2018-04-13 ENCOUNTER — Ambulatory Visit (INDEPENDENT_AMBULATORY_CARE_PROVIDER_SITE_OTHER): Payer: 59 | Admitting: Gastroenterology

## 2018-04-13 VITALS — BP 149/98 | HR 83 | Temp 97.4°F | Ht 73.0 in | Wt 230.8 lb

## 2018-04-13 DIAGNOSIS — K219 Gastro-esophageal reflux disease without esophagitis: Secondary | ICD-10-CM | POA: Diagnosis not present

## 2018-04-13 DIAGNOSIS — R933 Abnormal findings on diagnostic imaging of other parts of digestive tract: Secondary | ICD-10-CM | POA: Diagnosis not present

## 2018-04-13 MED ORDER — NA SULFATE-K SULFATE-MG SULF 17.5-3.13-1.6 GM/177ML PO SOLN: 1.0000 | ORAL | 0 refills | Status: DC

## 2018-04-13 MED ORDER — PANTOPRAZOLE SODIUM 40 MG PO TBEC: 40.0000 mg | DELAYED_RELEASE_TABLET | Freq: Every day | ORAL | 3 refills | Status: DC

## 2018-04-13 NOTE — Telephone Encounter (Signed)
Pre-op scheduled for 04/23/18 at 1:15pm. Patient aware. Letter mailed.   PA done via [facility] website for [employer]: The notification/prior authorization reference number is [id number].Your Notification/Prior Authorization submission has been Approved and no further action is required for this request.

## 2018-04-13 NOTE — Patient Instructions (Addendum)
I have sent in Protonix to take once each day, 30 minutes before breakfast. This is to protect your esophagus due to reflux.  We have arranged a colonoscopy in the near future with Dr. Gala Romney. Please call with any abdominal pain, fever, chills, nausea, vomiting.  I enjoyed seeing you again today! As you know, I value our relationship and want to provide genuine, compassionate, and quality care. I welcome your feedback. If you receive a survey regarding your visit,  I greatly appreciate you taking time to fill this out. See you next time!  Annitta Needs, PhD, ANP-BC [facility]

## 2018-04-13 NOTE — Progress Notes (Signed)
    Referring Provider: Knowlton, Steve, MD Primary Care Physician:  Knowlton, Steve, MD Primary GI: Dr. Rourk   Chief Complaint  Patient presents with  . abnormal colon on PET scan    HPI:   [name] is a 62 y.o. male presenting today with a history of chronic GERD, last seen in Nov 2018 doing well. July 2018 EGD/dilation with esophageal stenosis. Referred back to us due to findings on PET Aug 2019 of extensive colonic diverticulosis with wall thickening and hypermetabolism within the sigmoid, suggestion of subtle surrounding inflammation, favoring mild diverticulitis but unable to exclude underlying neoplasm. PET was done due to lung nodule followed closely by pulmonology.    No rectal bleeding. No changes in bowel habits. No fever or chills. Last colonoscopy in 2007 with hyperplastic polyps. No unexplained weight loss or lack of appetite. Swallowing improved. Once in awhile if eating too fast and feels like things are not going down but this is rare. Overall, he is asymptomatic and clinically not consistent with diverticulitis now or at time of PET.     Past Medical History:  Diagnosis Date  . Bilateral shoulder bursitis   . COPD (chronic obstructive pulmonary disease) with emphysema ([name])   . DDD (degenerative disc disease), lumbar    hx epideral injection  L5--S1   . Dysphasia    intermittant w/ food   . Dyspnea on exertion   . GERD (gastroesophageal reflux disease)   . Hiatal hernia   . History of acute pancreatitis    alcoholic pancreatitis 11-11-2011 and 01-30-2012  . History of colon polyps    10-17-2005  hyperplastic polyp's  . History of esophageal stricture    s/p  dilation 02-02-2017  . History of peptic ulcer 1990s  . History of pneumococcal pneumonia 2006   bilateral pneumonia w/ left lower lobe abscess treated w/ chest tube with suction for drainage  . History of pneumothorax    1984--  left spontaneous pneumothorax ,  treated w/ chest tube  .  Hypertension   . Malignant neoplasm of prostate ([name]) urologist-  dr dahlstedt/  oncologist -- dr manning   dx 12-02-2016-- Stage T2b, Gleason 3+4,  PSA 5.99,  vol 25cc  . Numbness of left foot    outer left ankle numb-- per pt has appt. w/ neurologist  . OSA (obstructive sleep apnea)    per pt has not used cpap over year ago from 04-16-2017--- per study 01-17-2010  severe osa  . Wears glasses     Past Surgical History:  Procedure Laterality Date  . BIOPSY  02/02/2017   Procedure: BIOPSY;  Surgeon: Rourk, Robert M, MD;  [facility];  Service: Endoscopy;;  duodenal bx's, esophgeal bx's  . BIOPSY  07/30/2017   Procedure: BIOPSY;  Surgeon: Rourk, Robert M, MD;  Location: [facility];  Service: Endoscopy;;  esophagus  . CARDIOVASCULAR STRESS TEST  09/12/2009   normal nuclear perfusion study w/ no ischemia /  normal LV function and wall motion , ef 57%  . COLONOSCOPY  2007   Dr. Rourk: hyperplastic polyps, internal hemorrhoids   . ESOPHAGOGASTRODUODENOSCOPY (EGD) WITH PROPOFOL N/A 02/02/2017   Dr. Rourk: esophageal stenosis s/p dilation and biopsy, medium sized hiatal hernia, normal duodenum  . ESOPHAGOGASTRODUODENOSCOPY (EGD) WITH PROPOFOL N/A 07/30/2017   Dr. Rourk: esophageal stenosis s/p dilation and biopsy, medium-sized hiatal hernia, normal duodenum  . HEMORRHOID SURGERY  01-21-2008    [place]  . MALONEY DILATION N/A 02/02/2017     Procedure: MALONEY DILATION;  Surgeon: Daneil Dolin, MD;  Location: [facility];  Service: Endoscopy;  Laterality: N/A;  . Venia Minks DILATION N/A 07/30/2017   Procedure: Venia Minks DILATION;  Surgeon: Daneil Dolin, MD;  Location: [facility];  Service: Endoscopy;  Laterality: N/A;  . RADIOACTIVE SEED IMPLANT N/A 04/23/2017   Procedure: RADIOACTIVE SEED IMPLANT/BRACHYTHERAPY IMPLANT;  Surgeon: Franchot Gallo, MD;  Location: [facility];  Service: Urology;  Laterality: N/A;  . SPACE OAR INSTILLATION N/A 04/23/2017   Procedure:  SPACE OAR INSTILLATION;  Surgeon: Franchot Gallo, MD;  Location: [facility];  Service: Urology;  Laterality: N/A;  . TRANSTHORACIC ECHOCARDIOGRAM  03/28/2009   mild LVH, ef 55-60%/ mild aorta calcification  . VIDEO ASSISTED THORACOSCOPY (VATS)/DECORTICATION Left     Current Outpatient Medications  Medication Sig Dispense Refill  . albuterol (PROVENTIL HFA;VENTOLIN HFA) 108 (90 Base) MCG/ACT inhaler Inhale 2 puffs into the lungs every 4 (four) hours as needed for wheezing or shortness of breath. 1 Inhaler 5  . albuterol (PROVENTIL) (2.5 MG/3ML) 0.083% nebulizer solution Take 3 mLs (2.5 mg total) by nebulization every 6 (six) hours as needed for wheezing or shortness of breath. 300 mL 5  . ibuprofen (ADVIL,MOTRIN) 200 MG tablet Take 400 mg by mouth daily as needed for mild pain.    Marland Kitchen Spacer/Aero-Holding Chambers (AEROCHAMBER MV) inhaler Use as instructed 1 each 0  . SYMBICORT 160-4.5 MCG/ACT inhaler Inhale 2 puffs into the lungs 2 (two) times daily.   11  . Tiotropium Bromide Monohydrate (SPIRIVA RESPIMAT) 2.5 MCG/ACT AERS Inhale 2 puffs into the lungs daily. (Patient not taking: Reported on 04/21/2018) 2 Inhaler 0  . Ipratropium-Albuterol (COMBIVENT RESPIMAT) 20-100 MCG/ACT AERS respimat Inhale 1 puff into the lungs 2 (two) times daily.    Marland Kitchen ipratropium-albuterol (DUONEB) 0.5-2.5 (3) MG/3ML SOLN Inhale 3 mLs into the lungs 4 (four) times daily as needed for wheezing or shortness of breath.  11  . Na Sulfate-K Sulfate-Mg Sulf 17.5-3.13-1.6 GM/177ML SOLN Take 1 kit by mouth as directed. 1 Bottle 0  . pantoprazole (PROTONIX) 40 MG tablet Take 1 tablet (40 mg total) by mouth daily. Take 30 minutes before breakfast 90 tablet 3  . polyvinyl alcohol (LUBRICANT DROPS) 1.4 % ophthalmic solution Place 1 drop into both eyes 3 (three) times daily as needed for dry eyes.     No current facility-administered medications for this visit.     Allergies as of 04/13/2018  . (No Known  Allergies)    Family History  Problem Relation Age of Onset  . Cancer Neg Hx   . Colon cancer Neg Hx   . Colon polyps Neg Hx   . Neuropathy Neg Hx     Social History   Socioeconomic History  . Marital status: Married    Spouse name: Not on file  . Number of children: Not on file  . Years of education: Not on file  . Highest education level: Not on file  Occupational History  . Not on file  Social Needs  . Financial resource strain: Not on file  . Food insecurity:    Worry: Not on file    Inability: Not on file  . Transportation needs:    Medical: Not on file    Non-medical: Not on file  Tobacco Use  . Smoking status: Former Smoker    Packs/day: 1.00    Years: 30.00    Pack years: 30.00    Types: Cigarettes, E-cigarettes  Last attempt to quit: 10/03/2015    Years since quitting: 2.5  . Smokeless tobacco: Never Used  Substance and Sexual Activity  . Alcohol use: Yes    Alcohol/week: 21.0 standard drinks    Types: 21 Shots of liquor per week    Comment: 2-3 drinks daily (Wine)   . Drug use: No  . Sexual activity: Not on file  Lifestyle  . Physical activity:    Days per week: Not on file    Minutes per session: Not on file  . Stress: Not on file  Relationships  . Social connections:    Talks on phone: Not on file    Gets together: Not on file    Attends religious service: Not on file    Active member of club or organization: Not on file    Attends meetings of clubs or organizations: Not on file    Relationship status: Not on file  Other Topics Concern  . Not on file  Social History Narrative  . Not on file    Review of Systems: Gen: Denies fever, chills, anorexia. Denies fatigue, weakness, weight loss.  CV: Denies chest pain, palpitations, syncope, peripheral edema, and claudication. Resp: +DOE GI: see HPI  Derm: Denies rash, itching, dry skin Psych: Denies depression, anxiety, memory loss, confusion. No homicidal or suicidal ideation.  Heme: Denies  bruising, bleeding, and enlarged lymph nodes.  Physical Exam: BP (!) 149/98   Pulse 83   Temp (!) 97.4 F (36.3 C) (Oral)   Ht 6' 1" (1.854 m)   Wt 230 lb 12.8 oz (104.7 kg)   BMI 30.45 kg/m  General:   Alert and oriented. No distress noted. Pleasant and cooperative.  Head:  Normocephalic and atraumatic. Eyes:  Conjuctiva clear without scleral icterus. Mouth:  Oral mucosa pink and moist. Good dentition. No lesions. Abdomen:  +BS, soft, non-tender and non-distended. No rebound or guarding. No HSM or masses noted. Msk:  Symmetrical without gross deformities. Normal posture. Extremities:  Without edema. Neurologic:  Alert and  oriented x4 Psych:  Alert and cooperative. Normal mood and affect.

## 2018-04-21 ENCOUNTER — Encounter: Payer: Self-pay | Admitting: Gastroenterology

## 2018-04-21 NOTE — Patient Instructions (Signed)
[name]  04/21/2018     @PREFPERIOPPHARMACY @   Your procedure is scheduled on  05/03/2018 .  Report to [facility] at  1230  P.M.  Call this number if you have problems the morning of surgery:  [phone]   Remember:  Do not eat or drink after midnight.  You may drink clear liquids until (follow the instructions given to you) .  Clear liquids allowed are:                    Water, Juice (non-citric and without pulp), Carbonated beverages, Clear Tea, Black Coffee only, Plain Jell-O only, Gatorade and Plain Popsicles only    Take these medicines the morning of surgery with A SIP OF WATER  Protonix. Use your inhalers and your nebulizer before you come.    Do not wear jewelry, make-up or nail polish.  Do not wear lotions, powders, or perfumes, or deodorant.  Do not shave 48 hours prior to surgery.  Men may shave face and neck.  Do not bring valuables to the hospital.  [facility] is not responsible for any belongings or valuables.  Contacts, dentures or bridgework may not be worn into surgery.  Leave your suitcase in the car.  After surgery it may be brought to your room.  For patients admitted to the hospital, discharge time will be determined by your treatment team.  Patients discharged the day of surgery will not be allowed to drive home.   Name and phone number of your driver:   family Special instructions:  Follow the diet and prep instructions given to you by Dr Roseanne Kaufman office.  Please read over the following fact sheets that you were given. Anesthesia Post-op Instructions and Care and Recovery After Surgery       Colonoscopy, Adult A colonoscopy is an exam to look at the large intestine. It is done to check for problems, such as:  Lumps (tumors).  Growths (polyps).  Swelling (inflammation).  Bleeding.  What happens before the procedure? Eating and drinking Follow instructions from your doctor about eating and drinking. These  instructions may include:  A few days before the procedure - follow a low-fiber diet. ? Avoid nuts. ? Avoid seeds. ? Avoid dried fruit. ? Avoid raw fruits. ? Avoid vegetables.  1-3 days before the procedure - follow a clear liquid diet. Avoid liquids that have red or purple dye. Drink only clear liquids, such as: ? Clear broth or bouillon. ? Black coffee or tea. ? Clear juice. ? Clear soft drinks or sports drinks. ? Gelatin dessert. ? Popsicles.  On the day of the procedure - do not eat or drink anything during the 2 hours before the procedure.  Bowel prep If you were prescribed an oral bowel prep:  Take it as told by your doctor. Starting the day before your procedure, you will need to drink a lot of liquid. The liquid will cause you to poop (have bowel movements) until your poop is almost clear or light green.  If your skin or butt gets irritated from diarrhea, you may: ? Wipe the area with wipes that have medicine in them, such as adult wet wipes with aloe and vitamin E. ? Put something on your skin that soothes the area, such as petroleum jelly.  If you throw up (vomit) while drinking the bowel prep, take a break for up to 60 minutes. Then begin the bowel prep  again. If you keep throwing up and you cannot take the bowel prep without throwing up, call your doctor.  General instructions  Ask your doctor about changing or stopping your normal medicines. This is important if you take diabetes medicines or blood thinners.  Plan to have someone take you home from the hospital or clinic. What happens during the procedure?  An IV tube may be put into one of your veins.  You will be given medicine to help you relax (sedative).  To reduce your risk of infection: ? Your doctors will wash their hands. ? Your anal area will be washed with soap.  You will be asked to lie on your side with your knees bent.  Your doctor will get a long, thin, flexible tube ready. The tube will have  a camera and a light on the end.  The tube will be put into your anus.  The tube will be gently put into your large intestine.  Air will be delivered into your large intestine to keep it open. You may feel some pressure or cramping.  The camera will be used to take photos.  A small tissue sample may be removed from your body to be looked at under a microscope (biopsy). If any possible problems are found, the tissue will be sent to a lab for testing.  If small growths are found, your doctor may remove them and have them checked for cancer.  The tube that was put into your anus will be slowly removed. The procedure may vary among doctors and hospitals. What happens after the procedure?  Your doctor will check on you often until the medicines you were given have worn off.  Do not drive for 24 hours after the procedure.  You may have a small amount of blood in your poop.  You may pass gas.  You may have mild cramps or bloating in your belly (abdomen).  It is up to you to get the results of your procedure. Ask your doctor, or the department performing the procedure, when your results will be ready. This information is not intended to replace advice given to you by your health care provider. Make sure you discuss any questions you have with your health care provider. Document Released: 08/23/2010 Document Revised: 05/21/2016 Document Reviewed: 10/02/2015 Elsevier Interactive Patient Education  2017 [employer]  Colonoscopy, Adult, Care After This sheet gives you information about how to care for yourself after your procedure. Your health care provider may also give you more specific instructions. If you have problems or questions, contact your health care provider. What can I expect after the procedure? After the procedure, it is common to have:  A small amount of blood in your stool for 24 hours after the procedure.  Some gas.  Mild abdominal cramping or bloating.  Follow  these instructions at home: General instructions   For the first 24 hours after the procedure: ? Do not drive or use machinery. ? Do not sign important documents. ? Do not drink alcohol. ? Do your regular daily activities at a slower pace than normal. ? Eat soft, easy-to-digest foods. ? Rest often.  Take over-the-counter or prescription medicines only as told by your health care provider.  It is up to you to get the results of your procedure. Ask your health care provider, or the department performing the procedure, when your results will be ready. Relieving cramping and bloating  Try walking around when you have cramps or feel bloated.  Apply heat to your abdomen as told by your health care provider. Use a heat source that your health care provider recommends, such as a moist heat pack or a heating pad. ? Place a towel between your skin and the heat source. ? Leave the heat on for 20-30 minutes. ? Remove the heat if your skin turns bright red. This is especially important if you are unable to feel pain, heat, or cold. You may have a greater risk of getting burned. Eating and drinking  Drink enough fluid to keep your urine clear or pale yellow.  Resume your normal diet as instructed by your health care provider. Avoid heavy or fried foods that are hard to digest.  Avoid drinking alcohol for as long as instructed by your health care provider. Contact a health care provider if:  You have blood in your stool 2-3 days after the procedure. Get help right away if:  You have more than a small spotting of blood in your stool.  You pass large blood clots in your stool.  Your abdomen is swollen.  You have nausea or vomiting.  You have a fever.  You have increasing abdominal pain that is not relieved with medicine. This information is not intended to replace advice given to you by your health care provider. Make sure you discuss any questions you have with your health care  provider. Document Released: 03/04/2004 Document Revised: 04/14/2016 Document Reviewed: 10/02/2015 Elsevier Interactive Patient Education  2018 [place] Anesthesia is a term that refers to techniques, procedures, and medicines that help a person stay safe and comfortable during a medical procedure. Monitored anesthesia care, or sedation, is one type of anesthesia. Your anesthesia specialist may recommend sedation if you will be having a procedure that does not require you to be unconscious, such as:  Cataract surgery.  A dental procedure.  A biopsy.  A colonoscopy.  During the procedure, you may receive a medicine to help you relax (sedative). There are three levels of sedation:  Mild sedation. At this level, you may feel awake and relaxed. You will be able to follow directions.  Moderate sedation. At this level, you will be sleepy. You may not remember the procedure.  Deep sedation. At this level, you will be asleep. You will not remember the procedure.  The more medicine you are given, the deeper your level of sedation will be. Depending on how you respond to the procedure, the anesthesia specialist may change your level of sedation or the type of anesthesia to fit your needs. An anesthesia specialist will monitor you closely during the procedure. Let your health care provider know about:  Any allergies you have.  All medicines you are taking, including vitamins, herbs, eye drops, creams, and over-the-counter medicines.  Any use of steroids (by mouth or as a cream).  Any problems you or family members have had with sedatives and anesthetic medicines.  Any blood disorders you have.  Any surgeries you have had.  Any medical conditions you have, such as sleep apnea.  Whether you are pregnant or may be pregnant.  Any use of cigarettes, alcohol, or street drugs. What are the risks? Generally, this is a safe procedure. However, problems may  occur, including:  Getting too much medicine (oversedation).  Nausea.  Allergic reaction to medicines.  Trouble breathing. If this happens, a breathing tube may be used to help with breathing. It will be removed when you are awake and breathing on your own.  Heart trouble.  Lung trouble.  Before the procedure Staying hydrated Follow instructions from your health care provider about hydration, which may include:  Up to 2 hours before the procedure - you may continue to drink clear liquids, such as water, clear fruit juice, black coffee, and plain tea.  Eating and drinking restrictions Follow instructions from your health care provider about eating and drinking, which may include:  8 hours before the procedure - stop eating heavy meals or foods such as meat, fried foods, or fatty foods.  6 hours before the procedure - stop eating light meals or foods, such as toast or cereal.  6 hours before the procedure - stop drinking milk or drinks that contain milk.  2 hours before the procedure - stop drinking clear liquids.  Medicines Ask your health care provider about:  Changing or stopping your regular medicines. This is especially important if you are taking diabetes medicines or blood thinners.  Taking medicines such as aspirin and ibuprofen. These medicines can thin your blood. Do not take these medicines before your procedure if your health care provider instructs you not to.  Tests and exams  You will have a physical exam.  You may have blood tests done to show: ? How well your kidneys and liver are working. ? How well your blood can clot.  General instructions  Plan to have someone take you home from the hospital or clinic.  If you will be going home right after the procedure, plan to have someone with you for 24 hours.  What happens during the procedure?  Your blood pressure, heart rate, breathing, level of pain and overall condition will be monitored.  An IV  tube will be inserted into one of your veins.  Your anesthesia specialist will give you medicines as needed to keep you comfortable during the procedure. This may mean changing the level of sedation.  The procedure will be performed. After the procedure  Your blood pressure, heart rate, breathing rate, and blood oxygen level will be monitored until the medicines you were given have worn off.  Do not drive for 24 hours if you received a sedative.  You may: ? Feel sleepy, clumsy, or nauseous. ? Feel forgetful about what happened after the procedure. ? Have a sore throat if you had a breathing tube during the procedure. ? Vomit. This information is not intended to replace advice given to you by your health care provider. Make sure you discuss any questions you have with your health care provider. Document Released: 04/16/2005 Document Revised: 12/28/2015 Document Reviewed: 11/11/2015 Elsevier Interactive Patient Education  2018 [place], Care After These instructions provide you with information about caring for yourself after your procedure. Your health care provider may also give you more specific instructions. Your treatment has been planned according to current medical practices, but problems sometimes occur. Call your health care provider if you have any problems or questions after your procedure. What can I expect after the procedure? After your procedure, it is common to:  Feel sleepy for several hours.  Feel clumsy and have poor balance for several hours.  Feel forgetful about what happened after the procedure.  Have poor judgment for several hours.  Feel nauseous or vomit.  Have a sore throat if you had a breathing tube during the procedure.  Follow these instructions at home: For at least 24 hours after the procedure:   Do not: ? Participate in activities in which you could fall  or become injured. ? Drive. ? Use heavy  machinery. ? Drink alcohol. ? Take sleeping pills or medicines that cause drowsiness. ? Make important decisions or sign legal documents. ? Take care of children on your own.  Rest. Eating and drinking  Follow the diet that is recommended by your health care provider.  If you vomit, drink water, juice, or soup when you can drink without vomiting.  Make sure you have little or no nausea before eating solid foods. General instructions  Have a responsible adult stay with you until you are awake and alert.  Take over-the-counter and prescription medicines only as told by your health care provider.  If you smoke, do not smoke without supervision.  Keep all follow-up visits as told by your health care provider. This is important. Contact a health care provider if:  You keep feeling nauseous or you keep vomiting.  You feel light-headed.  You develop a rash.  You have a fever. Get help right away if:  You have trouble breathing. This information is not intended to replace advice given to you by your health care provider. Make sure you discuss any questions you have with your health care provider. Document Released: 11/11/2015 Document Revised: 03/12/2016 Document Reviewed: 11/11/2015 Elsevier Interactive Patient Education  [employer].

## 2018-04-22 ENCOUNTER — Encounter: Payer: Self-pay | Admitting: Gastroenterology

## 2018-04-22 NOTE — Assessment & Plan Note (Signed)
History of esophageal stenosis s/p dilation, chronic GERD. Start Protonix once daily

## 2018-04-22 NOTE — Assessment & Plan Note (Signed)
62 year old male with lung nodule followed by Pulmonology, recently undergoing PET CT Aug 2019 with sigmoid wall thickening and hypermetabolism, suggestion of surround inflammation, query uncomplicated diverticulitis but unable to exclude underlying neoplasm. He was completely asymptomatic at time of PET and remains without any clinical signs of diverticulitis. Last colonoscopy 2007 with hyperplastic polyps. No FH of colorectal cancer. Pursue diagnostic colonoscopy in near future.  Proceed with TCS with Dr. Gala Romney in near future: the risks, benefits, and alternatives have been discussed with the patient in detail. The patient states understanding and desires to proceed. Propofol due to daily ETOH use

## 2018-04-23 ENCOUNTER — Encounter (HOSPITAL_COMMUNITY)
Admission: RE | Admit: 2018-04-23 | Discharge: 2018-04-23 | Disposition: A | Discharge status: Home / Self Care | Payer: 59 | Source: Ambulatory Visit | Attending: Internal Medicine | Admitting: Internal Medicine

## 2018-04-23 ENCOUNTER — Encounter (HOSPITAL_COMMUNITY): Payer: Self-pay

## 2018-04-23 ENCOUNTER — Other Ambulatory Visit: Payer: Self-pay

## 2018-04-23 DIAGNOSIS — Z01818 Encounter for other preprocedural examination: Secondary | ICD-10-CM | POA: Diagnosis not present

## 2018-04-23 HISTORY — DX: Dyspnea, unspecified: R06.00

## 2018-04-23 LAB — CBC
HCT: 42.9 % (ref 39.0–52.0)
HEMOGLOBIN: 14.3 g/dL (ref 13.0–17.0)
MCH: 33.6 pg (ref 26.0–34.0)
MCHC: 33.3 g/dL (ref 30.0–36.0)
MCV: 100.7 fL — ABNORMAL HIGH (ref 78.0–100.0)
Platelets: 146 10*3/uL — ABNORMAL LOW (ref 150–400)
RBC: 4.26 MIL/uL (ref 4.22–5.81)
RDW: 12.8 % (ref 11.5–15.5)
WBC: 7.5 10*3/uL (ref 4.0–10.5)

## 2018-04-23 LAB — BASIC METABOLIC PANEL
ANION GAP: 6 (ref 5–15)
BUN: 15 mg/dL (ref 8–23)
CO2: 25 mmol/L (ref 22–32)
Calcium: 8.8 mg/dL — ABNORMAL LOW (ref 8.9–10.3)
Chloride: 110 mmol/L (ref 98–111)
Creatinine, Ser: 1.39 mg/dL — ABNORMAL HIGH (ref 0.61–1.24)
GFR, EST NON AFRICAN AMERICAN: 53 mL/min — AB (ref >60)
Glucose, Bld: 127 mg/dL — ABNORMAL HIGH (ref 70–99)
Potassium: 4.1 mmol/L (ref 3.5–5.1)
Sodium: 141 mmol/L (ref 135–145)

## 2018-04-26 NOTE — Progress Notes (Signed)
CC'D TO PCP °

## 2018-05-03 ENCOUNTER — Other Ambulatory Visit: Payer: Self-pay

## 2018-05-03 ENCOUNTER — Encounter (HOSPITAL_COMMUNITY)
Admission: RE | Disposition: A | Discharge status: Home / Self Care | Payer: Self-pay | Source: Ambulatory Visit | Attending: Internal Medicine

## 2018-05-03 ENCOUNTER — Encounter (HOSPITAL_COMMUNITY): Payer: Self-pay | Admitting: *Deleted

## 2018-05-03 ENCOUNTER — Ambulatory Visit (HOSPITAL_COMMUNITY)
Admission: RE | Admit: 2018-05-03 | Discharge: 2018-05-03 | Disposition: A | Discharge status: Home / Self Care | Payer: 59 | Source: Ambulatory Visit | Attending: Internal Medicine | Admitting: Internal Medicine

## 2018-05-03 ENCOUNTER — Ambulatory Visit (HOSPITAL_COMMUNITY): Payer: 59 | Admitting: Anesthesiology

## 2018-05-03 DIAGNOSIS — J439 Emphysema, unspecified: Secondary | ICD-10-CM | POA: Insufficient documentation

## 2018-05-03 DIAGNOSIS — K5730 Diverticulosis of large intestine without perforation or abscess without bleeding: Secondary | ICD-10-CM | POA: Insufficient documentation

## 2018-05-03 DIAGNOSIS — Z8711 Personal history of peptic ulcer disease: Secondary | ICD-10-CM | POA: Insufficient documentation

## 2018-05-03 DIAGNOSIS — K219 Gastro-esophageal reflux disease without esophagitis: Secondary | ICD-10-CM | POA: Diagnosis not present

## 2018-05-03 DIAGNOSIS — I10 Essential (primary) hypertension: Secondary | ICD-10-CM | POA: Insufficient documentation

## 2018-05-03 DIAGNOSIS — K621 Rectal polyp: Secondary | ICD-10-CM

## 2018-05-03 DIAGNOSIS — D122 Benign neoplasm of ascending colon: Secondary | ICD-10-CM

## 2018-05-03 DIAGNOSIS — R933 Abnormal findings on diagnostic imaging of other parts of digestive tract: Secondary | ICD-10-CM | POA: Diagnosis present

## 2018-05-03 DIAGNOSIS — Z8546 Personal history of malignant neoplasm of prostate: Secondary | ICD-10-CM | POA: Diagnosis not present

## 2018-05-03 DIAGNOSIS — G4733 Obstructive sleep apnea (adult) (pediatric): Secondary | ICD-10-CM | POA: Diagnosis not present

## 2018-05-03 DIAGNOSIS — K5140 Inflammatory polyps of colon without complications: Secondary | ICD-10-CM | POA: Insufficient documentation

## 2018-05-03 DIAGNOSIS — K635 Polyp of colon: Secondary | ICD-10-CM | POA: Diagnosis not present

## 2018-05-03 DIAGNOSIS — Z79899 Other long term (current) drug therapy: Secondary | ICD-10-CM | POA: Insufficient documentation

## 2018-05-03 DIAGNOSIS — Z87891 Personal history of nicotine dependence: Secondary | ICD-10-CM | POA: Diagnosis not present

## 2018-05-03 HISTORY — PX: POLYPECTOMY: SHX5525

## 2018-05-03 HISTORY — PX: COLONOSCOPY WITH PROPOFOL: SHX5780

## 2018-05-03 SURGERY — COLONOSCOPY WITH PROPOFOL
Anesthesia: Monitor Anesthesia Care

## 2018-05-03 MED ORDER — LACTATED RINGERS IV SOLN: INTRAVENOUS | Status: DC

## 2018-05-03 MED ORDER — PROPOFOL 10 MG/ML IV BOLUS
INTRAVENOUS | Status: DC | PRN
  Administered 2018-05-03 (×4): 20 mg via INTRAVENOUS

## 2018-05-03 MED ORDER — PROMETHAZINE HCL 25 MG/ML IJ SOLN: 6.2500 mg | INTRAMUSCULAR | Status: DC | PRN

## 2018-05-03 MED ORDER — CHLORHEXIDINE GLUCONATE CLOTH 2 % EX PADS: 6.0000 | MEDICATED_PAD | Freq: Once | CUTANEOUS | Status: DC

## 2018-05-03 MED ORDER — HYDROCODONE-ACETAMINOPHEN 7.5-325 MG PO TABS: 1.0000 | ORAL_TABLET | Freq: Once | ORAL | Status: DC | PRN

## 2018-05-03 MED ORDER — MEPERIDINE HCL 100 MG/ML IJ SOLN: 6.2500 mg | INTRAMUSCULAR | Status: DC | PRN

## 2018-05-03 MED ORDER — PROPOFOL 500 MG/50ML IV EMUL
INTRAVENOUS | Status: DC | PRN
  Administered 2018-05-03: 125 ug/kg/min via INTRAVENOUS

## 2018-05-03 MED ORDER — LACTATED RINGERS IV SOLN
INTRAVENOUS | Status: DC
  Administered 2018-05-03: 13:00:00 via INTRAVENOUS

## 2018-05-03 MED ORDER — STERILE WATER FOR IRRIGATION IR SOLN
Status: DC | PRN
  Administered 2018-05-03: 100 mL

## 2018-05-03 MED ORDER — HYDROMORPHONE HCL 1 MG/ML IJ SOLN: 0.2500 mg | INTRAMUSCULAR | Status: DC | PRN

## 2018-05-03 NOTE — Progress Notes (Signed)
Patient with history of "dry eyes" per patient and wife. Patient with complaints of some burning of eyes. "this happens sometimes when I use my CPAP at home".  Wife verbalized that they left his eye drops for dry eyes at home.  Patient and wife verbalized they will use his dry eye drops "when I get home".

## 2018-05-03 NOTE — Anesthesia Preprocedure Evaluation (Signed)
Anesthesia Evaluation  Patient identified by MRN, date of birth, ID band Patient awake    Reviewed: Allergy & Precautions, H&P , NPO status , Patient's Chart, lab work & pertinent test results, reviewed documented beta blocker date and time   Airway Mallampati: II  TM Distance: >3 FB Neck ROM: full    Dental no notable dental hx. (+) Teeth Intact, Dental Advidsory Given   Pulmonary neg pulmonary ROS, shortness of breath and with exertion, sleep apnea , COPD, former smoker,    Pulmonary exam normal breath sounds clear to auscultation       Cardiovascular Exercise Tolerance: Good hypertension, negative cardio ROS   Rhythm:regular Rate:Normal     Neuro/Psych PSYCHIATRIC DISORDERS negative neurological ROS  negative psych ROS   GI/Hepatic negative GI ROS, Neg liver ROS, hiatal hernia, GERD  ,  Endo/Other  negative endocrine ROS  Renal/GU negative Renal ROS  negative genitourinary   Musculoskeletal   Abdominal   Peds  Hematology negative hematology ROS (+)   Anesthesia Other Findings NSR 78  Reproductive/Obstetrics negative OB ROS                             Anesthesia Physical Anesthesia Plan  ASA: III  Anesthesia Plan: MAC   Post-op Pain Management:    Induction:   PONV Risk Score and Plan:   Airway Management Planned:   Additional Equipment:   Intra-op Plan:   Post-operative Plan:   Informed Consent: I have reviewed the patients History and Physical, chart, labs and discussed the procedure including the risks, benefits and alternatives for the proposed anesthesia with the patient or authorized representative who has indicated his/her understanding and acceptance.   Dental Advisory Given  Plan Discussed with: CRNA and Anesthesiologist  Anesthesia Plan Comments:         Anesthesia Quick Evaluation

## 2018-05-03 NOTE — Op Note (Signed)
[facility] Patient Name: [name] Procedure Date: 05/03/2018 1:08 PM MRN: [id number] Date of Birth: [birth year] Attending MD: Norvel Richards , MD CSN: [id number] Age: 62 Admit Type: Outpatient Procedure:                Colonoscopy Indications:              Abnormal PET scan of the GI tract Providers:                Norvel Richards, MD, Lurline Del, RN, Aram Candela Referring MD:              Medicines:                Propofol per Anesthesia Complications:            No immediate complications. Estimated Blood Loss:     Estimated blood loss was minimal. Procedure:                Pre-Anesthesia Assessment:                           - Prior to the procedure, a History and Physical                            was performed, and patient medications and                            allergies were reviewed. The patient's tolerance of                            previous anesthesia was also reviewed. The risks                            and benefits of the procedure and the sedation                            options and risks were discussed with the patient.                            All questions were answered, and informed consent                            was obtained. Prior Anticoagulants: The patient has                            taken no previous anticoagulant or antiplatelet                            agents. ASA Grade Assessment: II - A patient with                            mild systemic disease. After reviewing the risks  and benefits, the patient was deemed in                            satisfactory condition to undergo the procedure.                           After obtaining informed consent, the colonoscope                            was passed under direct vision. Throughout the                            procedure, the patient's blood pressure, pulse, and                            oxygen saturations  were monitored continuously. The                            CF-[id number] ([id number]) scope was introduced through                            the and advanced to the the cecum, identified by                            appendiceal orifice and ileocecal valve. The                            colonoscopy was performed without difficulty. The                            patient tolerated the procedure well. The quality                            of the bowel preparation was adequate. The                            ileocecal valve, appendiceal orifice, and rectum                            were photographed. Scope In: 1:15:19 PM Scope Out: 1:51:19 PM Scope Withdrawal Time: 0 hours 27 minutes 35 seconds  Total Procedure Duration: 0 hours 36 minutes 0 seconds  Findings:      The perianal and digital rectal examinations were normal.      Scattered small and large-mouthed diverticula were found in the entire       colon, particularly in the sigmoid segment. Patient has a clusterings of       multiple hyperplastic/inflammatory appearing polyps throughout the       sigmoid segment. I did not see an overt neoplasm. Patient had small       hyperplastic type polyps in the rectum less than 5 mm which were not       manipulated. There is a 5 mm polyp at the splenic flexure and an area of       clustered semi-pedunculated polyps in the ascending segment. Multiple  hot and cold snare polypectomies were performed. A good bit of the       sigmoid segment were covered with these polyps. This area was debulked.       All the polyps were not removed. Multiple specimens submitted to the       pathologist. Impression:               - Diverticulosis in the entire examined colon.                           - Many polyps in the sigmoid colon, at the splenic                            flexure and in the ascending colon.                           -Multiple hot and cold snare polypectomies                             performed. Not all polyps were removed?see above. I                            suspect diverticular colitis -although patient has                            not had any such symptoms. Moderate Sedation:      Moderate (conscious) sedation was personally administered by an       anesthesia professional. The following parameters were monitored: oxygen       saturation, heart rate, blood pressure, respiratory rate, EKG, adequacy       of pulmonary ventilation, and response to care. Total physician       intraservice time was 42 minutes. Recommendation:           - Patient has a contact number available for                            emergencies. The signs and symptoms of potential                            delayed complications were discussed with the                            patient. Return to normal activities tomorrow.                            Written discharge instructions were provided to the                            patient.                           - Advance diet as tolerated today.                           - Continue present medications.                           -  Repeat colonoscopy date to be determined after                            pending pathology results are reviewed for                            surveillance based on pathology results.                           - Return to GI office in 3 months. Procedure Code(s):        --- Professional ---                           [phone], Colonoscopy, flexible; diagnostic, including                            collection of specimen(s) by brushing or washing,                            when performed (separate procedure) Diagnosis Code(s):        --- Professional ---                           D12.5, Benign neoplasm of sigmoid colon                           D12.3, Benign neoplasm of transverse colon (hepatic                            flexure or splenic flexure)                           D12.2, Benign neoplasm of ascending  colon                           K57.30, Diverticulosis of large intestine without                            perforation or abscess without bleeding                           R93.3, Abnormal findings on diagnostic imaging of                            other parts of digestive tract CPT copyright 2017 American Medical [employer]. All rights reserved. The codes documented in this report are preliminary and upon coder review may  be revised to meet current compliance requirements. [name]. Paarth Cropper, MD Norvel Richards, MD 05/03/2018 2:02:42 PM This report has been signed electronically. Number of Addenda: 0

## 2018-05-03 NOTE — Discharge Instructions (Signed)
Colonoscopy Discharge Instructions  Read the instructions outlined below and refer to this sheet in the next few weeks. These discharge instructions provide you with general information on caring for yourself after you leave the hospital. Your doctor may also give you specific instructions. While your treatment has been planned according to the most current medical practices available, unavoidable complications occasionally occur. If you have any problems or questions after discharge, call Dr. Gala [name] at [phone]. ACTIVITY  You may resume your regular activity, but move at a slower pace for the next 24 hours.   Take frequent rest periods for the next 24 hours.   Walking will help get rid of the air and reduce the bloated feeling in your belly (abdomen).   No driving for 24 hours (because of the medicine (anesthesia) used during the test).    Do not sign any important legal documents or operate any machinery for 24 hours (because of the anesthesia used during the test).  NUTRITION  Drink plenty of fluids.   You may resume your normal diet as instructed by your doctor.   Begin with a light meal and progress to your normal diet. Heavy or fried foods are harder to digest and may make you feel sick to your stomach (nauseated).   Avoid alcoholic beverages for 24 hours or as instructed.  MEDICATIONS  You may resume your normal medications unless your doctor tells you otherwise.  WHAT YOU CAN EXPECT TODAY  Some feelings of bloating in the abdomen.   Passage of more gas than usual.   Spotting of blood in your stool or on the toilet paper.  IF YOU HAD POLYPS REMOVED DURING THE COLONOSCOPY:  No aspirin products for 7 days or as instructed.   No alcohol for 7 days or as instructed.   Eat a soft diet for the next 24 hours.  FINDING OUT THE RESULTS OF YOUR TEST Not all test results are available during your visit. If your test results are not back during the visit, make an appointment  with your caregiver to find out the results. Do not assume everything is normal if you have not heard from your caregiver or the medical facility. It is important for you to follow up on all of your test results.  SEEK IMMEDIATE MEDICAL ATTENTION IF:  You have more than a spotting of blood in your stool.   Your belly is swollen (abdominal distention).   You are nauseated or vomiting.   You have a temperature over 101.   You have abdominal pain or discomfort that is severe or gets worse throughout the day.      Diverticulosis and colon polyp information provided  No aspirin or NSAIDs like Advil x10 days  Further recommendations to follow pending review of pathology report.      Diverticulosis Diverticulosis is a condition that develops when small pouches (diverticula) form in the wall of the large intestine (colon). The colon is where water is absorbed and stool is formed. The pouches form when the inside layer of the colon pushes through weak spots in the outer layers of the colon. You may have a few pouches or many of them. What are the causes? The cause of this condition is not known. What increases the risk? The following factors may make you more likely to develop this condition:  Being older than age 83. Your risk for this condition increases with age. Diverticulosis is rare among people younger than age 52. By age 13, many people  have it.  Eating a low-fiber diet.  Having frequent constipation.  Being overweight.  Not getting enough exercise.  Smoking.  Taking over-the-counter pain medicines, like aspirin and ibuprofen.  Having a family history of diverticulosis.  What are the signs or symptoms? In most people, there are no symptoms of this condition. If you do have symptoms, they may include:  Bloating.  Cramps in the abdomen.  Constipation or diarrhea.  Pain in the lower left side of the abdomen.  How is this diagnosed? This condition is most often  diagnosed during an exam for other colon problems. Because diverticulosis usually has no symptoms, it often cannot be diagnosed independently. This condition may be diagnosed by:  Using a flexible scope to examine the colon (colonoscopy).  Taking an X-ray of the colon after dye has been put into the colon (barium enema).  Doing a CT scan.  How is this treated? You may not need treatment for this condition if you have never developed an infection related to diverticulosis. If you have had an infection before, treatment may include:  Eating a high-fiber diet. This may include eating more fruits, vegetables, and grains.  Taking a fiber supplement.  Taking a live bacteria supplement (probiotic).  Taking medicine to relax your colon.  Taking antibiotic medicines.  Follow these instructions at home:  Drink 6-8 glasses of water or more each day to prevent constipation.  Try not to strain when you have a bowel movement.  If you have had an infection before: ? Eat more fiber as directed by your health care provider or your diet and nutrition specialist (dietitian). ? Take a fiber supplement or probiotic, if your health care provider approves.  Take over-the-counter and prescription medicines only as told by your health care provider.  If you were prescribed an antibiotic, take it as told by your health care provider. Do not stop taking the antibiotic even if you start to feel better.  Keep all follow-up visits as told by your health care provider. This is important. Contact a health care provider if:  You have pain in your abdomen.  You have bloating.  You have cramps.  You have not had a bowel movement in 3 days. Get help right away if:  Your pain gets worse.  Your bloating becomes very bad.  You have a fever or chills, and your symptoms suddenly get worse.  You vomit.  You have bowel movements that are bloody or black.  You have bleeding from your  rectum. Summary  Diverticulosis is a condition that develops when small pouches (diverticula) form in the wall of the large intestine (colon).  You may have a few pouches or many of them.  This condition is most often diagnosed during an exam for other colon problems.  If you have had an infection related to diverticulosis, treatment may include increasing the fiber in your diet, taking supplements, or taking medicines. This information is not intended to replace advice given to you by your health care provider. Make sure you discuss any questions you have with your health care provider. Document Released: 04/17/2004 Document Revised: 06/09/2016 Document Reviewed: 06/09/2016 Elsevier Interactive Patient Education  2017 [place].     Colon Polyps Polyps are tissue growths inside the body. Polyps can grow in many places, including the large intestine (colon). A polyp may be a round bump or a mushroom-shaped growth. You could have one polyp or several. Most colon polyps are noncancerous (benign). However, some colon polyps can  become cancerous over time. What are the causes? The exact cause of colon polyps is not known. What increases the risk? This condition is more likely to develop in people who:  Have a family history of colon cancer or colon polyps.  Are older than 13 or older than 45 if they are African American.  Have inflammatory bowel disease, such as ulcerative colitis or Crohn disease.  Are overweight.  Smoke cigarettes.  Do not get enough exercise.  Drink too much alcohol.  Eat a diet that is: ? High in fat and red meat. ? Low in fiber.  Had childhood cancer that was treated with abdominal radiation.  What are the signs or symptoms? Most polyps do not cause symptoms. If you have symptoms, they may include:  Blood coming from your rectum when having a bowel movement.  Blood in your stool.The stool may look dark red or black.  A change in bowel habits,  such as constipation or diarrhea.  How is this diagnosed? This condition is diagnosed with a colonoscopy. This is a procedure that uses a lighted, flexible scope to look at the inside of your colon. How is this treated? Treatment for this condition involves removing any polyps that are found. Those polyps will then be tested for cancer. If cancer is found, your health care provider will talk to you about options for colon cancer treatment. Follow these instructions at home: Diet  Eat plenty of fiber, such as fruits, vegetables, and whole grains.  Eat foods that are high in calcium and vitamin D, such as milk, cheese, yogurt, eggs, liver, fish, and broccoli.  Limit foods high in fat, red meats, and processed meats, such as hot dogs, sausage, bacon, and lunch meats.  Maintain a healthy weight, or lose weight if recommended by your health care provider. General instructions  Do not smoke cigarettes.  Do not drink alcohol excessively.  Keep all follow-up visits as told by your health care provider. This is important. This includes keeping regularly scheduled colonoscopies. Talk to your health care provider about when you need a colonoscopy.  Exercise every day or as told by your health care provider. Contact a health care provider if:  You have new or worsening bleeding during a bowel movement.  You have new or increased blood in your stool.  You have a change in bowel habits.  You unexpectedly lose weight. This information is not intended to replace advice given to you by your health care provider. Make sure you discuss any questions you have with your health care provider. Document Released: 04/16/2004 Document Revised: 12/27/2015 Document Reviewed: 06/11/2015 Elsevier Interactive Patient Education  2018 [place], Care After These instructions provide you with information about caring for yourself after your procedure. Your health care  provider may also give you more specific instructions. Your treatment has been planned according to current medical practices, but problems sometimes occur. Call your health care provider if you have any problems or questions after your procedure. What can I expect after the procedure? After your procedure, it is common to:  Feel sleepy for several hours.  Feel clumsy and have poor balance for several hours.  Feel forgetful about what happened after the procedure.  Have poor judgment for several hours.  Feel nauseous or vomit.  Have a sore throat if you had a breathing tube during the procedure.  Follow these instructions at home: For at least 24 hours after the procedure:  Do not: ? Participate in activities in which you could fall or become injured. ? Drive. ? Use heavy machinery. ? Drink alcohol. ? Take sleeping pills or medicines that cause drowsiness. ? Make important decisions or sign legal documents. ? Take care of children on your own.  Rest. Eating and drinking  Follow the diet that is recommended by your health care provider.  If you vomit, drink water, juice, or soup when you can drink without vomiting.  Make sure you have little or no nausea before eating solid foods. General instructions  Have a responsible adult stay with you until you are awake and alert.  Take over-the-counter and prescription medicines only as told by your health care provider.  If you smoke, do not smoke without supervision.  Keep all follow-up visits as told by your health care provider. This is important. Contact a health care provider if:  You keep feeling nauseous or you keep vomiting.  You feel light-headed.  You develop a rash.  You have a fever. Get help right away if:  You have trouble breathing. This information is not intended to replace advice given to you by your health care provider. Make sure you discuss any questions you have with your health care  provider. Document Released: 11/11/2015 Document Revised: 03/12/2016 Document Reviewed: 11/11/2015 Elsevier Interactive Patient Education  [employer].

## 2018-05-03 NOTE — Transfer of Care (Signed)
Immediate Anesthesia Transfer of Care Note  Patient: [name]  Procedure(s) Performed: COLONOSCOPY WITH PROPOFOL (N/A ) POLYPECTOMY  Patient Location: PACU  Anesthesia Type:MAC  Level of Consciousness: awake and alert   Airway & Oxygen Therapy: Patient Spontanous Breathing  Post-op Assessment: Report given to RN  Post vital signs: Reviewed and stable  Last Vitals:  Vitals Value Taken Time  BP    Temp    Pulse 75 05/03/2018  1:59 PM  Resp 15 05/03/2018  1:59 PM  SpO2 95 % 05/03/2018  1:59 PM  Vitals shown include unvalidated device data.  Last Pain:  Vitals:   05/03/18 1352  TempSrc:   PainSc: 0-No pain      Patients Stated Pain Goal: 8 (85/02/77 4128)  Complications: No apparent anesthesia complications

## 2018-05-03 NOTE — H&P (Signed)
_0 @   Primary Care Physician:  [name], MD Primary Gastroenterologist:  Dr. Gala Romney  Pre-Procedure History & Physical: HPI:  [name] is a 62 y.o. male here for follow-up abnormal colon on PET scan.  No GI symptoms.  PET done to follow-up pulmonary lesion.  Last colonoscopy 2007-negative.  Past Medical History:  Diagnosis Date  . Bilateral shoulder bursitis   . COPD (chronic obstructive pulmonary disease) with emphysema ([place])   . DDD (degenerative disc disease), lumbar    hx epideral injection  L5--S1   . Dysphasia    intermittant w/ food   . Dyspnea   . Dyspnea on exertion   . GERD (gastroesophageal reflux disease)   . Hiatal hernia   . History of acute pancreatitis    alcoholic pancreatitis [id number] and 01-30-2012  . History of colon polyps    10-17-2005  hyperplastic polyp's  . History of esophageal stricture    s/p  dilation 02-02-2017  . History of peptic ulcer 1990s  . History of pneumococcal pneumonia 2006   bilateral pneumonia w/ left lower lobe abscess treated w/ chest tube with suction for drainage  . History of pneumothorax    1984--  left spontaneous pneumothorax ,  treated w/ chest tube  . Hypertension   . Malignant neoplasm of prostate [facility]) urologist-  dr dahlstedt/  oncologist -- dr Tammi Klippel   dx 12-02-2016-- Stage T2b, Gleason 3+4,  PSA 5.99,  vol 25cc  . Numbness of left foot    outer left ankle numb-- per pt has appt. w/ neurologist  . OSA (obstructive sleep apnea)    per pt has not used cpap over year ago from 04-16-2017--- per study 01-17-2010  severe osa  . Wears glasses     Past Surgical History:  Procedure Laterality Date  . BIOPSY  02/02/2017   Procedure: BIOPSY;  Surgeon: Daneil Dolin, MD;  [facility];  Service: Endoscopy;;  duodenal bx's, esophgeal bx's  . BIOPSY  07/30/2017   Procedure: BIOPSY;  Surgeon: Daneil Dolin, MD;  Location: [facility];  Service: Endoscopy;;  esophagus  . CARDIOVASCULAR STRESS  TEST  09/12/2009   normal nuclear perfusion study w/ no ischemia /  normal LV function and wall motion , ef 57%  . COLONOSCOPY  2007   Dr. Gala Romney: hyperplastic polyps, internal hemorrhoids   . ESOPHAGOGASTRODUODENOSCOPY (EGD) WITH PROPOFOL N/A 02/02/2017   Dr. Gala Romney: esophageal stenosis s/p dilation and biopsy, medium sized hiatal hernia, normal duodenum  . ESOPHAGOGASTRODUODENOSCOPY (EGD) WITH PROPOFOL N/A 07/30/2017   Dr. Gala Romney: esophageal stenosis s/p dilation and biopsy, medium-sized hiatal hernia, normal duodenum  . HEMORRHOID SURGERY  01-21-2008    [facility]  . MALONEY DILATION N/A 02/02/2017   Procedure: Venia Minks DILATION;  Surgeon: Daneil Dolin, MD;  Location: [facility];  Service: Endoscopy;  Laterality: N/A;  . Venia Minks DILATION N/A 07/30/2017   Procedure: Venia Minks DILATION;  Surgeon: Daneil Dolin, MD;  Location: [facility];  Service: Endoscopy;  Laterality: N/A;  . RADIOACTIVE SEED IMPLANT N/A 04/23/2017   Procedure: RADIOACTIVE SEED IMPLANT/BRACHYTHERAPY IMPLANT;  Surgeon: Franchot Gallo, MD;  Location: [facility];  Service: Urology;  Laterality: N/A;  . SPACE OAR INSTILLATION N/A 04/23/2017   Procedure: SPACE OAR INSTILLATION;  Surgeon: Franchot Gallo, MD;  Location: [facility];  Service: Urology;  Laterality: N/A;  . TRANSTHORACIC ECHOCARDIOGRAM  03/28/2009   mild LVH, ef 55-60%/ mild aorta calcification  . VIDEO ASSISTED THORACOSCOPY (VATS)/DECORTICATION  Left     Prior to Admission medications   Medication Sig Start Date End Date Taking? Authorizing Provider  albuterol (PROVENTIL HFA;VENTOLIN HFA) 108 (90 Base) MCG/ACT inhaler Inhale 2 puffs into the lungs every 4 (four) hours as needed for wheezing or shortness of breath. 03/22/18  Yes Icard, Bradley L, DO  albuterol (PROVENTIL) (2.5 MG/3ML) 0.083% nebulizer solution Take 3 mLs (2.5 mg total) by nebulization every 6 (six) hours as needed for wheezing or shortness of breath. 03/22/18   Yes Icard, Octavio Graves, DO  ibuprofen (ADVIL,MOTRIN) 200 MG tablet Take 400 mg by mouth daily as needed for mild pain.   Yes [provider]  Ipratropium-Albuterol (COMBIVENT RESPIMAT) 20-100 MCG/ACT AERS respimat Inhale 1 puff into the lungs 2 (two) times daily.   Yes [provider]  ipratropium-albuterol (DUONEB) 0.5-2.5 (3) MG/3ML SOLN Inhale 3 mLs into the lungs 4 (four) times daily as needed for wheezing or shortness of breath. 04/10/18  Yes [provider]  Na Sulfate-K Sulfate-Mg Sulf 17.5-3.13-1.6 GM/177ML SOLN Take 1 kit by mouth as directed. 04/13/18  Yes Bernon Arviso, [name], MD  pantoprazole (PROTONIX) 40 MG tablet Take 1 tablet (40 mg total) by mouth daily. Take 30 minutes before breakfast 04/13/18  Yes Annitta Needs, NP  polyvinyl alcohol (LUBRICANT DROPS) 1.4 % ophthalmic solution Place 1 drop into both eyes 3 (three) times daily as needed for dry eyes.   Yes [provider]  SYMBICORT 160-4.5 MCG/ACT inhaler Inhale 2 puffs into the lungs 2 (two) times daily.  03/07/17  Yes [provider]  Spacer/Aero-Holding Chambers (AEROCHAMBER MV) inhaler Use as instructed 03/22/18   Icard, Octavio Graves, DO  Tiotropium Bromide Monohydrate (SPIRIVA RESPIMAT) 2.5 MCG/ACT AERS Inhale 2 puffs into the lungs daily. Patient not taking: Reported on 04/21/2018 03/22/18   June Leap L, DO    Allergies as of 04/13/2018  . (No Known Allergies)    Family History  Problem Relation Age of Onset  . Cancer Neg Hx   . Colon cancer Neg Hx   . Colon polyps Neg Hx   . Neuropathy Neg Hx     Social History   Socioeconomic History  . Marital status: Married    Spouse name: Not on file  . Number of children: Not on file  . Years of education: Not on file  . Highest education level: Not on file  Occupational History  . Not on file  Social Needs  . Financial resource strain: Not on file  . Food insecurity:    Worry: Not on file    Inability: Not on file  .  Transportation needs:    Medical: Not on file    Non-medical: Not on file  Tobacco Use  . Smoking status: Former Smoker    Packs/day: 1.00    Years: 30.00    Pack years: 30.00    Types: Cigarettes, E-cigarettes    Last attempt to quit: 10/03/2015    Years since quitting: 2.5  . Smokeless tobacco: Never Used  . Tobacco comment: stoped vaping 2018  Substance and Sexual Activity  . Alcohol use: Yes    Alcohol/week: 21.0 standard drinks    Types: 21 Shots of liquor per week    Comment: 2-3 drinks daily (Wine)   . Drug use: No  . Sexual activity: Yes    Birth control/protection: None  Lifestyle  . Physical activity:    Days per week: Not on file    Minutes per session: Not on  file  . Stress: Not on file  Relationships  . Social connections:    Talks on phone: Not on file    Gets together: Not on file    Attends religious service: Not on file    Active member of club or organization: Not on file    Attends meetings of clubs or organizations: Not on file    Relationship status: Not on file  . Intimate partner violence:    Fear of current or ex partner: Not on file    Emotionally abused: Not on file    Physically abused: Not on file    Forced sexual activity: Not on file  Other Topics Concern  . Not on file  Social History Narrative  . Not on file    Review of Systems: See HPI, otherwise negative ROS  Physical Exam: BP 124/85   Pulse 79   Temp 98 F (36.7 C) (Oral)   Resp 16   Ht _0  (1.854 m)   Wt 105.7 kg   SpO2 92%   BMI 30.74 kg/m  General:   Alert,  Well-developed, well-nourished, pleasant and cooperative in NAD Neck:  Supple; no masses or thyromegaly. No significant cervical adenopathy. Lungs:  Clear throughout to auscultation.   No wheezes, crackles, or rhonchi. No acute distress. Heart:  Regular rate and rhythm; no murmurs, clicks, rubs,  or gallops. Abdomen: Non-distended, normal bowel sounds.  Soft and nontender without appreciable mass or  hepatosplenomegaly.  Pulses:  Normal pulses noted. Extremities:  Without clubbing or edema.  Impression/Plan: 62 year old gentleman here for diagnostic colonoscopy-abnormal imaging.  The risks, benefits, limitations, alternatives and imponderables have been reviewed with the patient. Questions have been answered. All parties are agreeable.     Notice: This dictation was prepared with Dragon dictation along with smaller phrase technology. Any transcriptional errors that result from this process are unintentional and may not be corrected upon review.

## 2018-05-03 NOTE — Anesthesia Postprocedure Evaluation (Signed)
Anesthesia Post Note  Patient: JB DULWORTH  Procedure(s) Performed: COLONOSCOPY WITH PROPOFOL (N/A ) POLYPECTOMY  Patient location during evaluation: PACU Anesthesia Type: MAC Level of consciousness: awake and alert and oriented Pain management: pain level controlled Vital Signs Assessment: post-procedure vital signs reviewed and stable Respiratory status: spontaneous breathing Cardiovascular status: blood pressure returned to baseline and stable Postop Assessment: no apparent nausea or vomiting Anesthetic complications: no     Last Vitals:  Vitals:   05/03/18 1239  BP: 124/85  Pulse: 79  Resp: 16  Temp: 36.7 C  SpO2: 92%    Last Pain:  Vitals:   05/03/18 1352  TempSrc:   PainSc: 0-No pain                 Ikey Omary

## 2018-05-04 ENCOUNTER — Telehealth: Payer: Self-pay | Admitting: Gastroenterology

## 2018-05-04 ENCOUNTER — Other Ambulatory Visit (HOSPITAL_COMMUNITY): Payer: Self-pay | Admitting: Family Medicine

## 2018-05-04 ENCOUNTER — Ambulatory Visit (HOSPITAL_COMMUNITY)
Admission: RE | Admit: 2018-05-04 | Discharge: 2018-05-04 | Disposition: A | Discharge status: Home / Self Care | Payer: 59 | Source: Ambulatory Visit | Attending: Family Medicine | Admitting: Family Medicine

## 2018-05-04 ENCOUNTER — Other Ambulatory Visit (HOSPITAL_COMMUNITY)
Admit: 2018-05-04 | Discharge: 2018-05-04 | Disposition: A | Discharge status: Home / Self Care | Payer: 59 | Source: Ambulatory Visit | Attending: Family Medicine | Admitting: Family Medicine

## 2018-05-04 DIAGNOSIS — I82431 Acute embolism and thrombosis of right popliteal vein: Secondary | ICD-10-CM | POA: Diagnosis not present

## 2018-05-04 DIAGNOSIS — M79604 Pain in right leg: Secondary | ICD-10-CM

## 2018-05-04 LAB — D-DIMER, QUANTITATIVE (NOT AT ARMC): D DIMER QUANT: 2.1 ug{FEU}/mL — AB (ref 0.00–0.50)

## 2018-05-04 NOTE — Telephone Encounter (Signed)
Received call from Lenon Oms, NP from Dr. Vickey Sages office. 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 Starting Xarelto.

## 2018-05-05 ENCOUNTER — Encounter (HOSPITAL_COMMUNITY): Payer: Self-pay | Admitting: Internal Medicine

## 2018-05-09 ENCOUNTER — Encounter: Payer: Self-pay | Admitting: Internal Medicine

## 2018-05-10 ENCOUNTER — Telehealth: Payer: Self-pay

## 2018-05-10 NOTE — Telephone Encounter (Signed)
Read letter in pts chart to pts spouse. Letter will be mailed when letter is reduced.

## 2018-05-10 NOTE — Telephone Encounter (Signed)
Correction, when letter is released.

## 2018-05-10 NOTE — Telephone Encounter (Signed)
Received a VM from pts spouse in reference to results. LMOM, waiting on a return call.

## 2018-05-11 ENCOUNTER — Ambulatory Visit: Payer: 59 | Admitting: Internal Medicine

## 2018-05-11 ENCOUNTER — Encounter: Payer: Self-pay | Admitting: Pulmonary Disease

## 2018-05-11 DIAGNOSIS — I82409 Acute embolism and thrombosis of unspecified deep veins of unspecified lower extremity: Secondary | ICD-10-CM | POA: Insufficient documentation

## 2018-05-11 DIAGNOSIS — R911 Solitary pulmonary nodule: Secondary | ICD-10-CM | POA: Insufficient documentation

## 2018-05-11 HISTORY — DX: Solitary pulmonary nodule: R91.1

## 2018-05-11 NOTE — Progress Notes (Signed)
Synopsis: Referred in August 2019 for COPD, RUL pulmonary nodule by [name], MD  Subjective:   PATIENT ID: [name] GENDER: male DOB: [birth year], MRN: [id number]  Chief Complaint  Patient presents with  . Follow-up    States he does not see a big difference with the spiriva.     Patient has a past medical history of obstructive sleep apnea, prostate cancer as well as tobacco abuse and has been labeled with COPD in the past.  No prior PFTs.  He was recently seen by his PCP with complaints of worsening shortness of breath, 35-pack-year history of smoking, quit smoking 2 years ago, started vaping but recently quit vaping approximately 3 to 4 months ago.  He does have shortness of breath with exertion.  He is active outside, plays golf regularly and swims. 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  He gets short winded, SOB and DOE with any exertion. He feels like he has had to slow down due to his symptoms. 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 He has noticed people asking is he is "ok" are you breathing okay situation. He has a had a few episodes where he was seen in the ED. He was at work and couldn't get his breath and he was transported from work.  He has never been intubated or placed on mechanical ventilation.  He has never needed NIPPV for an exacerbation.  OV 05/12/2018: Since our last visit a PET scan was completed for further evaluation of the lung nodule seen on initial LDCT. This revealed low level uptake now with planned follow up. However there was abnormal uptake within the colon which led to a C-scope by GI revealing multiple polyps, tubular adenomas, no cancer detected and colonic diverticulosis. On 10/1 he had a LE Duplex + Right Popliteal DVT and he was started on Xarelto.  This was considered a provoked DVT following a 4-hour flight coming back from [place].  He did state that his breathing was difficult while he was in  [place].  He did have difficulty with exertion and climbing out of the [facility] when he was out with a group sightseeing.  At this point denies any additional chest pain, wheezing or chest tightness.  He has been using his medication regularly.     Past Medical History:  Diagnosis Date  . Bilateral shoulder bursitis   . COPD (chronic obstructive pulmonary disease) with emphysema ([place])   . DDD (degenerative disc disease), lumbar    hx epideral injection  L5--S1   . Dysphasia    intermittant w/ food   . Dyspnea   . Dyspnea on exertion   . GERD (gastroesophageal reflux disease)   . Hiatal hernia   . History of acute pancreatitis    alcoholic pancreatitis [id number] and 01-30-2012  . History of colon polyps    10-17-2005  hyperplastic polyp's  . History of esophageal stricture    s/p  dilation 02-02-2017  . History of peptic ulcer 1990s  . History of pneumococcal pneumonia 2006   bilateral pneumonia w/ left lower lobe abscess treated w/ chest tube with suction for drainage  . History of pneumothorax    1984--  left spontaneous pneumothorax ,  treated w/ chest tube  . Hypertension   . Malignant neoplasm of prostate [facility]) urologist-  dr dahlstedt/  oncologist -- dr Tammi Klippel   dx 12-02-2016-- Stage T2b, Gleason 3+4,  PSA 5.99,  vol 25cc  .  Numbness of left foot    outer left ankle numb-- per pt has appt. w/ neurologist  . OSA (obstructive sleep apnea)    per pt has not used cpap over year ago from 04-16-2017--- per study 01-17-2010  severe osa  . Solid nodule of lung greater than 8 mm in diameter 05/11/2018   03/2018: RUL Nodule, 23m  . Wears glasses      Family History  Problem Relation Age of Onset  . Cancer Neg Hx   . Colon cancer Neg Hx   . Colon polyps Neg Hx   . Neuropathy Neg Hx      Social History   Socioeconomic History  . Marital status: Married    Spouse name: Not on file  . Number of children: Not on file  . Years of education: Not on file  . Highest education  level: Not on file  Occupational History  . Not on file  Social [name]  . Financial resource strain: Not on file  . Food insecurity:    Worry: Not on file    Inability: Not on file  . Transportation [name]:    Medical: Not on file    Non-medical: Not on file  Tobacco Use  . Smoking status: Former Smoker    Packs/day: 1.00    Years: 30.00    Pack years: 30.00    Types: Cigarettes, E-cigarettes    Last attempt to quit: 10/03/2015    Years since quitting: 2.6  . Smokeless tobacco: Never Used  . Tobacco comment: stoped vaping 2018  Substance and Sexual Activity  . Alcohol use: Yes    Alcohol/week: 21.0 standard drinks    Types: 21 Shots of liquor per week    Comment: 2-3 drinks daily (Wine)   . Drug use: No  . Sexual activity: Yes    Birth control/protection: None  Lifestyle  . Physical activity:    Days per week: Not on file    Minutes per session: Not on file  . Stress: Not on file  Relationships  . Social connections:    Talks on phone: Not on file    Gets together: Not on file    Attends religious service: Not on file    Active member of club or organization: Not on file    Attends meetings of clubs or organizations: Not on file    Relationship status: Not on file  . Intimate partner violence:    Fear of current or ex partner: Not on file    Emotionally abused: Not on file    Physically abused: Not on file    Forced sexual activity: Not on file  Other Topics Concern  . Not on file  Social History Narrative  . Not on file     No Known Allergies   Outpatient Medications Prior to Visit  Medication Sig Dispense Refill  . albuterol (PROVENTIL HFA;VENTOLIN HFA) 108 (90 Base) MCG/ACT inhaler Inhale 2 puffs into the lungs every 4 (four) hours as needed for wheezing or shortness of breath. 1 Inhaler 5  . albuterol (PROVENTIL) (2.5 MG/3ML) 0.083% nebulizer solution Take 3 mLs (2.5 mg total) by nebulization every 6 (six) hours as needed for wheezing or shortness of breath.  300 mL 5  . pantoprazole (PROTONIX) 40 MG tablet Take 1 tablet (40 mg total) by mouth daily. Take 30 minutes before breakfast 90 tablet 3  . polyvinyl alcohol (LUBRICANT DROPS) 1.4 % ophthalmic solution Place 1 drop into both eyes 3 (three) times  daily as needed for dry eyes.    Marland Kitchen Spacer/Aero-Holding Chambers (AEROCHAMBER MV) inhaler Use as instructed 1 each 0  . XARELTO STARTER PACK 15 & 20 MG TBPK     . ibuprofen (ADVIL,MOTRIN) 200 MG tablet Take 400 mg by mouth daily as needed for mild pain.    . Ipratropium-Albuterol (COMBIVENT RESPIMAT) 20-100 MCG/ACT AERS respimat Inhale 1 puff into the lungs 2 (two) times daily.    Marland Kitchen ipratropium-albuterol (DUONEB) 0.5-2.5 (3) MG/3ML SOLN Inhale 3 mLs into the lungs 4 (four) times daily as needed for wheezing or shortness of breath.  11  . Na Sulfate-K Sulfate-Mg Sulf 17.5-3.13-1.6 GM/177ML SOLN Take 1 kit by mouth as directed. (Patient not taking: Reported on 05/12/2018) 1 Bottle 0  . SYMBICORT 160-4.5 MCG/ACT inhaler Inhale 2 puffs into the lungs 2 (two) times daily.   11   No facility-administered medications prior to visit.     ROS   Objective:  Physical Exam  Vitals:   05/12/18 1027  BP: 128/88  Pulse: 91  SpO2: 95%  Weight: 232 lb (105.2 kg)  Height: '6\' 2"'$  (1.88 m)   95% on RA BMI Readings from Last 3 Encounters:  05/12/18 29.79 kg/m  05/03/18 30.74 kg/m  04/23/18 30.74 kg/m   Wt Readings from Last 3 Encounters:  05/12/18 232 lb (105.2 kg)  05/03/18 233 lb (105.7 kg)  04/23/18 233 lb (105.7 kg)     CBC    Component Value Date/Time   WBC 7.5 04/23/2018 1325   RBC 4.26 04/23/2018 1325   HGB 14.3 04/23/2018 1325   HCT 42.9 04/23/2018 1325   PLT 146 (L) 04/23/2018 1325   MCV 100.7 (H) 04/23/2018 1325   MCH 33.6 04/23/2018 1325   MCHC 33.3 04/23/2018 1325   RDW 12.8 04/23/2018 1325   LYMPHSABS 2.4 06/23/2017 0045   MONOABS 0.9 06/23/2017 0045   EOSABS 0.4 06/23/2017 0045   BASOSABS 0.0 06/23/2017 0045     Chest  Imaging: LDCT 03/2018 - RUL Lung nodule, approximately 18m with areas of surrounding scar, significant BL upper lobe predominant emphysema The patient's images have been independently reviewed by me.    PET Scan: 03/23/2018 Mild uptake in the RUL density. Extensive colonic uptake from PET scan The patient's images have been independently reviewed by me.    Pulmonary Functions Testing Results:  FeNO: None  Pathology: None   Echocardiogram: None   Heart Catheterization: None     Assessment & Plan:   Moderate COPD (chronic obstructive pulmonary disease) ([name])  Deep vein thrombosis (DVT) of popliteal vein of right lower extremity, unspecified chronicity ([name])  Tobacco abuse  Obstructive sleep apnea treated with continuous positive airway pressure (CPAP)  Solid nodule of lung greater than 8 mm in diameter  Adenomatous polyp of colon, unspecified part of colon  Discussion:  Recent completed PET scan, SUV 1.3 for the 9 mm right upper lobe nodule.  Still with high risk right upper lobe nodule due to smoking history, age, location and associated emphysema.  Additionally he has moderate COPD.  We will make changes to in his inhaler regimen today.  We will start Trelegy.  He can stop the use of Combivent and Symbicort.  3 month follow up CT has been ordered   Continue Xarelto[facility] considered provoked D[facility] and will need 3 months anticoagulation  Outpatient follow-up with sleep clinic, continue CPAP management.  Additionally covered importance of follow-up with GI regarding multiple colon polyps.  >50% of  of this patient's 25-minute visit was completed face-to-face counseling on the above medical problems and treatment regimen.   Current Outpatient Medications:  .  albuterol (PROVENTIL HFA;VENTOLIN HFA) 108 (90 Base) MCG/ACT inhaler, Inhale 2 puffs into the lungs every 4 (four) hours as needed for wheezing or shortness of breath., Disp: 1 Inhaler,  Rfl: 5 .  albuterol (PROVENTIL) (2.5 MG/3ML) 0.083% nebulizer solution, Take 3 mLs (2.5 mg total) by nebulization every 6 (six) hours as needed for wheezing or shortness of breath., Disp: 300 mL, Rfl: 5 .  pantoprazole (PROTONIX) 40 MG tablet, Take 1 tablet (40 mg total) by mouth daily. Take 30 minutes before breakfast, Disp: 90 tablet, Rfl: 3 .  polyvinyl alcohol (LUBRICANT DROPS) 1.4 % ophthalmic solution, Place 1 drop into both eyes 3 (three) times daily as needed for dry eyes., Disp: , Rfl:  .  Spacer/Aero-Holding Chambers (AEROCHAMBER MV) inhaler, Use as instructed, Disp: 1 each, Rfl: 0 .  XARELTO STARTER PACK 15 & 20 MG TBPK, , Disp: , Rfl:  .  Fluticasone-Umeclidin-Vilant (TRELEGY ELLIPTA) 100-62.5-25 MCG/INH AEPB, Inhale 1 puff into the lungs daily., Disp: 28 each, Rfl: 0   Garner Nash, DO [facility] 05/12/2018 11:08 AM

## 2018-05-11 NOTE — Patient Instructions (Addendum)
Thank you for visiting Dr. Valeta Harms at [facility]. Today we recommend the following:  You can stop the use of Combivent as well as Symbicort and Spiriva. We will start you on Trelegy.  And as needed albuterol inhaler. 53-month noncontrasted CT of the chest has already been ordered for follow-up of your lung nodule. Continue Xarelto as directed with follow-up to hematology at [facility] ordered this encounter  Medications  . Fluticasone-Umeclidin-Vilant (TRELEGY ELLIPTA) 100-62.5-25 MCG/INH AEPB    Sig: Inhale 1 puff into the lungs daily.    Dispense:  28 each    Refill:  0   Return in about 8 weeks (around 07/07/2018).  After CT imaging of the chest is completed.

## 2018-05-12 ENCOUNTER — Ambulatory Visit (INDEPENDENT_AMBULATORY_CARE_PROVIDER_SITE_OTHER): Payer: 59 | Admitting: Pulmonary Disease

## 2018-05-12 ENCOUNTER — Encounter: Payer: Self-pay | Admitting: Internal Medicine

## 2018-05-12 ENCOUNTER — Encounter: Payer: Self-pay | Admitting: Pulmonary Disease

## 2018-05-12 VITALS — BP 128/88 | HR 91 | Ht 74.0 in | Wt 232.0 lb

## 2018-05-12 DIAGNOSIS — D126 Benign neoplasm of colon, unspecified: Secondary | ICD-10-CM

## 2018-05-12 DIAGNOSIS — G4733 Obstructive sleep apnea (adult) (pediatric): Secondary | ICD-10-CM

## 2018-05-12 DIAGNOSIS — J449 Chronic obstructive pulmonary disease, unspecified: Secondary | ICD-10-CM

## 2018-05-12 DIAGNOSIS — Z720 Tobacco use: Secondary | ICD-10-CM | POA: Diagnosis not present

## 2018-05-12 DIAGNOSIS — I82431 Acute embolism and thrombosis of right popliteal vein: Secondary | ICD-10-CM | POA: Diagnosis not present

## 2018-05-12 DIAGNOSIS — R911 Solitary pulmonary nodule: Secondary | ICD-10-CM

## 2018-05-12 DIAGNOSIS — Z9989 Dependence on other enabling machines and devices: Secondary | ICD-10-CM

## 2018-05-12 MED ORDER — FLUTICASONE-UMECLIDIN-VILANT 100-62.5-25 MCG/INH IN AEPB: 1.0000 | INHALATION_SPRAY | Freq: Every day | RESPIRATORY_TRACT | 0 refills | Status: DC

## 2018-05-14 ENCOUNTER — Inpatient Hospital Stay (HOSPITAL_COMMUNITY): Payer: 59

## 2018-05-14 ENCOUNTER — Encounter (HOSPITAL_COMMUNITY): Payer: Self-pay | Admitting: Hematology

## 2018-05-14 ENCOUNTER — Inpatient Hospital Stay (HOSPITAL_COMMUNITY): Payer: 59 | Attending: Hematology | Admitting: Hematology

## 2018-05-14 VITALS — BP 138/89 | HR 85 | Temp 98.1°F | Resp 20 | Wt 231.0 lb

## 2018-05-14 DIAGNOSIS — I82431 Acute embolism and thrombosis of right popliteal vein: Secondary | ICD-10-CM | POA: Insufficient documentation

## 2018-05-14 DIAGNOSIS — Z7901 Long term (current) use of anticoagulants: Secondary | ICD-10-CM | POA: Diagnosis not present

## 2018-05-14 DIAGNOSIS — J449 Chronic obstructive pulmonary disease, unspecified: Secondary | ICD-10-CM | POA: Diagnosis not present

## 2018-05-14 DIAGNOSIS — Z87891 Personal history of nicotine dependence: Secondary | ICD-10-CM

## 2018-05-14 DIAGNOSIS — C61 Malignant neoplasm of prostate: Secondary | ICD-10-CM | POA: Diagnosis not present

## 2018-05-14 NOTE — Assessment & Plan Note (Addendum)
1.  Right leg DVT (weakly provoked): - Patient reportedly had a 4-hour flight and felt cramping in his right leg for the next 4 days. - An ultrasound done on 05/04/2018 showed occlusive DVT in the right popliteal and peroneal veins. - He reports that he was checked 15 years ago for factor V Leiden and was positive.  His daughter was positive and had a history of multiple DVTs and PEs. - He was started on Xarelto and he is tolerating it well.  He no longer has right leg pains. -He never had any prior history of thromboembolism. -He smoked 2 packs/day for 45 years, quit 2 to 3 years ago.  His job involves sitting at a desk. - I have recommended checking lupus anticoagulant, anticardiolipin antibodies, and anti-beta-2 glycoprotein 1 antibodies, which if positive will result in recommendation of lifelong anticoagulation. -We will also check factor V Leiden to see if he is homozygous or heterozygous.  He will be seen back in 4 weeks to discuss the results. - In the absence of any of these mutations, he will have 6 months of anticoagulation.  His DVT can be considered as weakly provoked.  2.  Prostate cancer: - Reportedly received seed implantation and brachytherapy in October 2018.  Last PSA was reportedly undetectable.  He follows up with Dr. Diona Fanti.

## 2018-05-14 NOTE — Progress Notes (Signed)
[facility] NOTE  Patient Care Team: [name], MD as PCP - General (Family Medicine) Gala Romney [name], MD as Consulting Physician (Gastroenterology)  CHIEF COMPLAINTS/PURPOSE OF CONSULTATION:  Right leg DVT.  HISTORY OF PRESENTING ILLNESS:  [name] 62 y.o. male is seen in consultation today for further work-up and management of right leg DVT.  He reportedly has a 4-hour flight and felt cramping in his right calf for the next 4 days.  He was seen by his PMD who ordered a d-dimer and an ultrasound.  D-dimer was positive.  Ultrasound done on 05/04/2018 showed occlusive DVT in the right popliteal and peroneal veins.  He denies any prior history of DVTs.  He was started on Xarelto and his pain improved.  He reportedly had a daughter who had multiple DVTs and pulmonary embolism and was diagnosed with factor V Leiden.  He was checked for factor V Leiden and was found to be positive about 15 years ago.  Unfortunately I do not have the records.  He denies any history of connective tissue disorders.  His only major medical problem was COPD.  He was also diagnosed with localized prostate cancer in 2018, underwent brachytherapy with seed implants.  His last PSA was reportedly undetectable.  He still works and his job involves sitting at [employer].  He smoked 2 packs/day for up to 45 years and quit about 2 to 3 years ago.  He is active and plays golf on a regular basis.  His last colonoscopy was done on 05/03/2018 which shows diverticulosis, many polyps in the sigmoid colon, splenic flexure and ascending colon.  Biopsies were consistent with tubular adenoma.  He also follows up with his pulmonologist for lung nodules and COPD.  Denies any fevers, night sweats or weight loss in the last 6 months.  MEDICAL HISTORY:  Past Medical History:  Diagnosis Date  . Bilateral shoulder bursitis   . COPD (chronic obstructive pulmonary disease) with emphysema ([place])   . DDD (degenerative disc  disease), lumbar    hx epideral injection  L5--S1   . Dysphasia    intermittant w/ food   . Dyspnea   . Dyspnea on exertion   . GERD (gastroesophageal reflux disease)   . Hiatal hernia   . History of acute pancreatitis    alcoholic pancreatitis [id number] and 01-30-2012  . History of colon polyps    10-17-2005  hyperplastic polyp's  . History of esophageal stricture    s/p  dilation 02-02-2017  . History of peptic ulcer 1990s  . History of pneumococcal pneumonia 2006   bilateral pneumonia w/ left lower lobe abscess treated w/ chest tube with suction for drainage  . History of pneumothorax    1984--  left spontaneous pneumothorax ,  treated w/ chest tube  . Hypertension   . Malignant neoplasm of prostate [facility]) urologist-  dr dahlstedt/  oncologist -- dr Tammi Klippel   dx 12-02-2016-- Stage T2b, Gleason 3+4,  PSA 5.99,  vol 25cc  . Numbness of left foot    outer left ankle numb-- per pt has appt. w/ neurologist  . OSA (obstructive sleep apnea)    per pt has not used cpap over year ago from 04-16-2017--- per study 01-17-2010  severe osa  . Solid nodule of lung greater than 8 mm in diameter 05/11/2018   03/2018: RUL Nodule, 28mm  . Wears glasses     SURGICAL HISTORY: Past Surgical History:  Procedure Laterality Date  . BIOPSY  02/02/2017   Procedure: BIOPSY;  Surgeon: Daneil Dolin, MD;  [facility];  Service: Endoscopy;;  duodenal bx's, esophgeal bx's  . BIOPSY  07/30/2017   Procedure: BIOPSY;  Surgeon: Daneil Dolin, MD;  [facility];  Service: Endoscopy;;  esophagus  . CARDIOVASCULAR STRESS TEST  09/12/2009   normal nuclear perfusion study w/ no ischemia /  normal LV function and wall motion , ef 57%  . COLONOSCOPY  2007   Dr. Gala Romney: hyperplastic polyps, internal hemorrhoids   . COLONOSCOPY WITH PROPOFOL N/A 05/03/2018   Procedure: COLONOSCOPY WITH PROPOFOL;  Surgeon: Daneil Dolin, MD;  [facility];  Service: Endoscopy;  Laterality: N/A;   2:00pm  . ESOPHAGOGASTRODUODENOSCOPY (EGD) WITH PROPOFOL N/A 02/02/2017   Dr. Gala Romney: esophageal stenosis s/p dilation and biopsy, medium sized hiatal hernia, normal duodenum  . ESOPHAGOGASTRODUODENOSCOPY (EGD) WITH PROPOFOL N/A 07/30/2017   Dr. Gala Romney: esophageal stenosis s/p dilation and biopsy, medium-sized hiatal hernia, normal duodenum  . HEMORRHOID SURGERY  01-21-2008    [facility]  . MALONEY DILATION N/A 02/02/2017   Procedure: Venia Minks DILATION;  Surgeon: Daneil Dolin, MD;  [facility];  Service: Endoscopy;  Laterality: N/A;  . Venia Minks DILATION N/A 07/30/2017   Procedure: Venia Minks DILATION;  Surgeon: Daneil Dolin, MD;  [facility];  Service: Endoscopy;  Laterality: N/A;  . POLYPECTOMY  05/03/2018   Procedure: POLYPECTOMY;  Surgeon: Daneil Dolin, MD;  [facility];  Service: Endoscopy;;  ascending colon polyp, sigmoid colon polyps (multiple)  . RADIOACTIVE SEED IMPLANT N/A 04/23/2017   Procedure: RADIOACTIVE SEED IMPLANT/BRACHYTHERAPY IMPLANT;  Surgeon: Franchot Gallo, MD;  Location: [facility];  Service: Urology;  Laterality: N/A;  . SPACE OAR INSTILLATION N/A 04/23/2017   Procedure: SPACE OAR INSTILLATION;  Surgeon: Franchot Gallo, MD;  Location: [facility];  Service: Urology;  Laterality: N/A;  . TRANSTHORACIC ECHOCARDIOGRAM  03/28/2009   mild LVH, ef 55-60%/ mild aorta calcification  . VIDEO ASSISTED THORACOSCOPY (VATS)/DECORTICATION Left     SOCIAL HISTORY: Social History   Socioeconomic History  . Marital status: Married    Spouse name: Not on file  . Number of children: Not on file  . Years of education: Not on file  . Highest education level: Not on file  Occupational History  . Not on file  Social Needs  . Financial resource strain: Not on file  . Food insecurity:    Worry: Not on file    Inability: Not on file  . Transportation needs:    Medical: Not on file    Non-medical: Not on file   Tobacco Use  . Smoking status: Former Smoker    Packs/day: 1.00    Years: 30.00    Pack years: 30.00    Types: Cigarettes, E-cigarettes    Last attempt to quit: 10/03/2015    Years since quitting: 2.6  . Smokeless tobacco: Never Used  . Tobacco comment: stoped vaping 2018  Substance and Sexual Activity  . Alcohol use: Yes    Alcohol/week: 21.0 standard drinks    Types: 21 Shots of liquor per week    Comment: 2-3 drinks daily (Wine)   . Drug use: No  . Sexual activity: Yes    Birth control/protection: None  Lifestyle  . Physical activity:    Days per week: Not on file    Minutes per session: Not on file  . Stress: Not on file  Relationships  .  Social connections:    Talks on phone: Not on file    Gets together: Not on file    Attends religious service: Not on file    Active member of club or organization: Not on file    Attends meetings of clubs or organizations: Not on file    Relationship status: Not on file  . Intimate partner violence:    Fear of current or ex partner: Not on file    Emotionally abused: Not on file    Physically abused: Not on file    Forced sexual activity: Not on file  Other Topics Concern  . Not on file  Social History Narrative  . Not on file    FAMILY HISTORY: Family History  Problem Relation Age of Onset  . Cancer Neg Hx   . Colon cancer Neg Hx   . Colon polyps Neg Hx   . Neuropathy Neg Hx     ALLERGIES:  has No Known Allergies.  MEDICATIONS:  Current Outpatient Medications  Medication Sig Dispense Refill  . albuterol (PROVENTIL HFA;VENTOLIN HFA) 108 (90 Base) MCG/ACT inhaler Inhale 2 puffs into the lungs every 4 (four) hours as needed for wheezing or shortness of breath. 1 Inhaler 5  . albuterol (PROVENTIL) (2.5 MG/3ML) 0.083% nebulizer solution Take 3 mLs (2.5 mg total) by nebulization every 6 (six) hours as needed for wheezing or shortness of breath. 300 mL 5  . Fluticasone-Umeclidin-Vilant (TRELEGY ELLIPTA) 100-62.5-25 MCG/INH  AEPB Inhale 1 puff into the lungs daily. 28 each 0  . pantoprazole (PROTONIX) 40 MG tablet Take 1 tablet (40 mg total) by mouth daily. Take 30 minutes before breakfast 90 tablet 3  . XARELTO STARTER PACK 15 & 20 MG TBPK      No current facility-administered medications for this visit.     REVIEW OF SYSTEMS:   Constitutional: Denies fevers, chills or abnormal night sweats.  Positive for fatigue. Eyes: Denies blurriness of vision, double vision or watery eyes Ears, nose, mouth, throat, and face: Denies mucositis or sore throat Respiratory: Positive for shortness of breath on exertion. Cardiovascular: Denies palpitation, chest discomfort or lower extremity swelling Gastrointestinal:  Denies nausea, heartburn or change in bowel habits Skin: Denies abnormal skin rashes Lymphatics: Denies new lymphadenopathy or easy bruising Neurological:Denies numbness, tingling or new weaknesses Behavioral/Psych: Mood is stable, no new changes  All other systems were reviewed with the patient and are negative.  PHYSICAL EXAMINATION: ECOG PERFORMANCE STATUS: 0 - Asymptomatic  Vitals:   05/14/18 1344  BP: 138/89  Pulse: 85  Resp: 20  Temp: 98.1 F (36.7 C)  SpO2: 96%   Filed Weights   05/14/18 1344  Weight: 231 lb (104.8 kg)    GENERAL:alert, no distress and comfortable SKIN: skin color, texture, turgor are normal, no rashes or significant lesions EYES: normal, conjunctiva are pink and non-injected, sclera clear OROPHARYNX:no exudate, no erythema and lips, buccal mucosa, and tongue normal  NECK: supple, thyroid normal size, non-tender, without nodularity LYMPH:  no palpable lymphadenopathy in the cervical, axillary or inguinal LUNGS: clear to auscultation and percussion with normal breathing effort HEART: regular rate & rhythm and no murmurs and no lower extremity edema.  Chronic venous stasis changes of the left leg. ABDOMEN:abdomen soft, non-tender and normal bowel sounds Musculoskeletal:no  cyanosis of digits and no clubbing  PSYCH: alert & oriented x 3 with fluent speech NEURO: no focal motor/sensory deficits  LABORATORY DATA:  I have reviewed the data as listed Lab Results  Component Value Date  WBC 7.5 04/23/2018   HGB 14.3 04/23/2018   HCT 42.9 04/23/2018   MCV 100.7 (H) 04/23/2018   PLT 146 (L) 04/23/2018     Chemistry      Component Value Date/Time   NA 141 04/23/2018 1325   K 4.1 04/23/2018 1325   CL 110 04/23/2018 1325   CO2 25 04/23/2018 1325   BUN 15 04/23/2018 1325   CREATININE 1.39 (H) 04/23/2018 1325      Component Value Date/Time   CALCIUM 8.8 (L) 04/23/2018 1325   ALKPHOS 52 06/23/2017 0045   AST 31 06/23/2017 0045   ALT 31 06/23/2017 0045   BILITOT 0.8 06/23/2017 0045       RADIOGRAPHIC STUDIES: I have personally reviewed the radiological images as listed and agreed with the findings in the report. US Venous Img Lower Unilateral Right  Result Date: 05/04/2018 CLINICAL DATA:  Right calf pain. Recent long distance travel. Former smoker. Evaluate for DVT. EXAM: RIGHT LOWER EXTREMITY VENOUS DOPPLER ULTRASOUND TECHNIQUE: Gray-scale sonography with graded compression, as well as color Doppler and duplex ultrasound were performed to evaluate the lower extremity deep venous systems from the level of the common femoral vein and including the common femoral, femoral, profunda femoral, popliteal and calf veins including the posterior tibial, peroneal and gastrocnemius veins when visible. The superficial great saphenous vein was also interrogated. Spectral Doppler was utilized to evaluate flow at rest and with distal augmentation maneuvers in the common femoral, femoral and popliteal veins. COMPARISON:  None. FINDINGS: Contralateral Common Femoral Vein: Respiratory phasicity is normal and symmetric with the symptomatic side. No evidence of thrombus. Normal compressibility. Common Femoral Vein: No evidence of thrombus. Normal compressibility, respiratory  phasicity and response to augmentation. Saphenofemoral Junction: No evidence of thrombus. Normal compressibility and flow on color Doppler imaging. Profunda Femoral Vein: No evidence of thrombus. Normal compressibility and flow on color Doppler imaging. Femoral Vein: No evidence of thrombus. Normal compressibility, respiratory phasicity and response to augmentation. Popliteal Vein: There is hypoechoic occlusive DVT within the right popliteal vein (images 26 through 30). Calf Veins: There is hypoechoic occlusive expansile DVT within the right peroneal vein (images 36 through 38). The right posterior tibial and anterior tibial veins appear patent where imaged. Superficial Great Saphenous Vein: No evidence of thrombus. Normal compressibility. Other Findings:  None. IMPRESSION: Examination is positive for occlusive DVT involving the right popliteal and peroneal veins. These results will be called to the ordering clinician or representative by the Radiologist Assistant, and communication documented in the PACS or zVision Dashboard. Electronically Signed   By: Sandi Mariscal M.D.   On: 05/04/2018 15:34    ASSESSMENT & PLAN:  DVT (deep venous thrombosis) ([place]) 1.  Right leg DVT: - Patient reportedly had a 4-hour flight and felt cramping in his right leg for the next 4 days. - An ultrasound done on 05/04/2018 showed occlusive DVT in the right popliteal and peroneal veins. - He reports that he was checked 15 years ago for factor V Leiden and was positive.  His daughter was positive and had a history of multiple DVTs and PEs. - He was started on Xarelto and he is tolerating it well.  He no longer has right leg pains. -He never had any prior history of thromboembolism. -He smoked 2 packs/day for 45 years, quit 2 to 3 years ago.  His job involves sitting at a desk. - I have recommended checking lupus anticoagulant, anticardiolipin antibodies, and anti-beta-2 glycoprotein 1 antibodies, which if positive will result  in  recommendation of lifelong anticoagulation. -We will also check factor V Leiden to see if he is homozygous or heterozygous.  He will be seen back in 4 weeks to discuss the results. - In the absence of any of these mutations, he will have 6 months of anticoagulation.  His DVT can be considered as weakly provoked.  2.  Prostate cancer: - Reportedly received seed implantation and brachytherapy in October 2018.  Last PSA was reportedly undetectable.  He follows up with Dr. Diona Fanti.  Orders Placed This Encounter  Procedures  . Factor 5 leiden    Standing Status:   Future    Number of Occurrences:   1    Standing Expiration Date:   05/14/2019  . Lupus anticoagulant panel    Standing Status:   Future    Number of Occurrences:   1    Standing Expiration Date:   05/14/2019  . Beta-2-glycoprotein i abs, IgG/M/A    Standing Status:   Future    Number of Occurrences:   1    Standing Expiration Date:   05/14/2019  . Cardiolipin antibodies, IgG, IgM, IgA    Standing Status:   Future    Number of Occurrences:   1    Standing Expiration Date:   05/14/2019    All questions were answered. The patient knows to call the clinic with any problems, questions or concerns.     Derek Jack, MD 05/14/2018 3:10 PM

## 2018-05-14 NOTE — Patient Instructions (Signed)
[facility] Discharge Instructions     Thank you for choosing  [facility] at [facility] to provide your oncology and hematology care.  To afford each patient quality time with our provider, please arrive at least 15 minutes before your scheduled appointment time.   If you have a lab appointment with the Cancer Center please come in thru the  Main Entrance and check in at the main information desk  You need to re-schedule your appointment should you arrive 10 or more minutes late.  We strive to give you quality time with our providers, and arriving late affects you and other patients whose appointments are after yours.  Also, if you no show three or more times for appointments you may be dismissed from the clinic at the providers discretion.     Again, thank you for choosing [facility].  Our hope is that these requests will decrease the amount of time that you wait before being seen by our physicians.       _____________________________________________________________  Should you have questions after your visit to [facility], please contact our office at [phone] between the hours of 8:00 a.m. and 4:30 p.m.  Voicemails left after 4:00 p.m. will not be returned until the following business day.  For prescription refill requests, have your pharmacy contact our office and allow 72 hours.    Cancer Center Support Programs:   > Cancer Support Group  2nd Tuesday of the month 1pm-2pm, Journey Room    

## 2018-05-16 LAB — LUPUS ANTICOAGULANT PANEL
DRVVT: 138 s — ABNORMAL HIGH (ref 0.0–47.0)
PTT Lupus Anticoagulant: 42.2 s (ref 0.0–51.9)

## 2018-05-16 LAB — DRVVT MIX: dRVVT Mix: 81 s — ABNORMAL HIGH (ref 0.0–47.0)

## 2018-05-16 LAB — DRVVT CONFIRM: dRVVT Confirm: 2.1 ratio — ABNORMAL HIGH (ref 0.8–1.2)

## 2018-05-17 LAB — BETA-2-GLYCOPROTEIN I ABS, IGG/M/A: Beta-2-Glycoprotein I IgA: <9 GPI IgA units (ref 0–25)

## 2018-05-18 LAB — CARDIOLIPIN ANTIBODIES, IGG, IGM, IGA
Anticardiolipin IgA: <9 APL U/mL (ref 0–11)
Anticardiolipin IgM: 9 MPL U/mL (ref 0–12)

## 2018-05-21 LAB — FACTOR 5 LEIDEN

## 2018-06-11 ENCOUNTER — Telehealth: Payer: Self-pay | Admitting: Pulmonary Disease

## 2018-06-11 MED ORDER — FLUTICASONE-UMECLIDIN-VILANT 100-62.5-25 MCG/INH IN AEPB: 1.0000 | INHALATION_SPRAY | Freq: Every day | RESPIRATORY_TRACT | 0 refills | Status: DC

## 2018-06-11 NOTE — Telephone Encounter (Signed)
Patient's wife had called requesting a refill of Trelegy for the Patient.  Prescription refilled and sent to preferred pharmacy, [facility] in [place].  Nothing further at this time.

## 2018-06-24 ENCOUNTER — Other Ambulatory Visit: Payer: Self-pay

## 2018-06-24 ENCOUNTER — Encounter (HOSPITAL_COMMUNITY): Payer: Self-pay | Admitting: Hematology

## 2018-06-24 ENCOUNTER — Inpatient Hospital Stay (HOSPITAL_COMMUNITY): Payer: 59 | Attending: Hematology | Admitting: Hematology

## 2018-06-24 VITALS — BP 138/80 | HR 82 | Resp 16 | Wt 226.0 lb

## 2018-06-24 DIAGNOSIS — I10 Essential (primary) hypertension: Secondary | ICD-10-CM | POA: Insufficient documentation

## 2018-06-24 DIAGNOSIS — Z8546 Personal history of malignant neoplasm of prostate: Secondary | ICD-10-CM | POA: Insufficient documentation

## 2018-06-24 DIAGNOSIS — Z79899 Other long term (current) drug therapy: Secondary | ICD-10-CM | POA: Insufficient documentation

## 2018-06-24 DIAGNOSIS — Z87891 Personal history of nicotine dependence: Secondary | ICD-10-CM | POA: Diagnosis not present

## 2018-06-24 DIAGNOSIS — D6851 Activated protein C resistance: Secondary | ICD-10-CM | POA: Insufficient documentation

## 2018-06-24 DIAGNOSIS — I82431 Acute embolism and thrombosis of right popliteal vein: Secondary | ICD-10-CM | POA: Diagnosis not present

## 2018-06-24 DIAGNOSIS — Z7901 Long term (current) use of anticoagulants: Secondary | ICD-10-CM | POA: Diagnosis not present

## 2018-06-24 NOTE — Assessment & Plan Note (Signed)
1.  Right leg DVT (weakly provoked): - Patient reportedly had a 4-hour flight and felt cramping in his right leg for the next 4 days. - An ultrasound done on 05/04/2018 showed occlusive DVT in the right popliteal and peroneal veins. - He reports that he was checked 15 years ago for factor V Leiden and was positive.  His daughter was positive and had a history of multiple DVTs and PEs. - He was started on Xarelto and he is tolerating it well.  He no longer has right leg pains. -He never had any prior history of thromboembolism. -He smoked 2 packs/day for 45 years, quit 2 to 3 years ago.  His job involves sitting at a desk. - I discussed the results of his blood work today.  Lupus anticoagulant was positive.  But this could be positive because of Xarelto.  He is heterozygous for factor V Leiden mutation.  That does not alter the duration of anticoagulation. - I have recommended rechecking lupus anticoagulant 3 to 4 weeks after discontinuing Xarelto.  He will complete 6 months of Xarelto and of April next year.  We plan to see him back end of May after repletion of lupus anticoagulant.  2.  Prostate cancer: - Reportedly received seed implantation and brachytherapy in October 2018.  Last PSA was reportedly undetectable.  He follows up with Dr. Diona Fanti.  3.  Smoking history: -Patient had a PET CT scan on 03/23/2018 followed by CT of the chest on 03/18/2018. - He needs to have a repeat CT scan of the chest without contrast in the next few days.

## 2018-06-24 NOTE — Progress Notes (Signed)
[place], [place] [address]   CLINIC:  Medical Oncology/Hematology  PCP:  [name], MD [place] [address] [phone]   REASON FOR VISIT: Follow-up for right leg DVT  CURRENT THERAPY: Xarelto daily   INTERVAL HISTORY:  [name] 62 y.o. male returns for routine follow-up for right leg DVT. He is doing well with no complaints at this time. He denies any new pain. Denies any swelling or redness in his legs. Denies any SOB or CP. Denies any bleeding. He reports his appetite and energy level are good. He lives at home and remain active throughout the day.     REVIEW OF SYSTEMS:  Review of Systems  All other systems reviewed and are negative.    PAST MEDICAL/SURGICAL HISTORY:  Past Medical History:  Diagnosis Date  . Bilateral shoulder bursitis   . COPD (chronic obstructive pulmonary disease) with emphysema ([place])   . DDD (degenerative disc disease), lumbar    hx epideral injection  L5--S1   . Dysphasia    intermittant w/ food   . Dyspnea   . Dyspnea on exertion   . GERD (gastroesophageal reflux disease)   . Hiatal hernia   . History of acute pancreatitis    alcoholic pancreatitis [id number] and 01-30-2012  . History of colon polyps    10-17-2005  hyperplastic polyp's  . History of esophageal stricture    s/p  dilation 02-02-2017  . History of peptic ulcer 1990s  . History of pneumococcal pneumonia 2006   bilateral pneumonia w/ left lower lobe abscess treated w/ chest tube with suction for drainage  . History of pneumothorax    1984--  left spontaneous pneumothorax ,  treated w/ chest tube  . Hypertension   . Malignant neoplasm of prostate [facility]) urologist-  dr dahlstedt/  oncologist -- dr Tammi Klippel   dx 12-02-2016-- Stage T2b, Gleason 3+4,  PSA 5.99,  vol 25cc  . Numbness of left foot    outer left ankle numb-- per pt has appt. w/ neurologist  . OSA (obstructive sleep apnea)    per pt has not used  cpap over year ago from 04-16-2017--- per study 01-17-2010  severe osa  . Solid nodule of lung greater than 8 mm in diameter 05/11/2018   03/2018: RUL Nodule, 37mm  . Wears glasses    Past Surgical History:  Procedure Laterality Date  . BIOPSY  02/02/2017   Procedure: BIOPSY;  Surgeon: Daneil Dolin, MD;  [facility];  Service: Endoscopy;;  duodenal bx's, esophgeal bx's  . BIOPSY  07/30/2017   Procedure: BIOPSY;  Surgeon: Daneil Dolin, MD;  [facility];  Service: Endoscopy;;  esophagus  . CARDIOVASCULAR STRESS TEST  09/12/2009   normal nuclear perfusion study w/ no ischemia /  normal LV function and wall motion , ef 57%  . COLONOSCOPY  2007   Dr. Gala Romney: hyperplastic polyps, internal hemorrhoids   . COLONOSCOPY WITH PROPOFOL N/A 05/03/2018   Procedure: COLONOSCOPY WITH PROPOFOL;  Surgeon: Daneil Dolin, MD;  [facility];  Service: Endoscopy;  Laterality: N/A;  2:00pm  . ESOPHAGOGASTRODUODENOSCOPY (EGD) WITH PROPOFOL N/A 02/02/2017   Dr. Gala Romney: esophageal stenosis s/p dilation and biopsy, medium sized hiatal hernia, normal duodenum  . ESOPHAGOGASTRODUODENOSCOPY (EGD) WITH PROPOFOL N/A 07/30/2017   Dr. Gala Romney: esophageal stenosis s/p dilation and biopsy, medium-sized hiatal hernia, normal duodenum  . HEMORRHOID SURGERY  01-21-2008    [facility]  .  MALONEY DILATION N/A 02/02/2017   Procedure: Venia Minks DILATION;  Surgeon: Daneil Dolin, MD;  [facility];  Service: Endoscopy;  Laterality: N/A;  . Venia Minks DILATION N/A 07/30/2017   Procedure: Venia Minks DILATION;  Surgeon: Daneil Dolin, MD;  [facility];  Service: Endoscopy;  Laterality: N/A;  . POLYPECTOMY  05/03/2018   Procedure: POLYPECTOMY;  Surgeon: Daneil Dolin, MD;  [facility];  Service: Endoscopy;;  ascending colon polyp, sigmoid colon polyps (multiple)  . RADIOACTIVE SEED IMPLANT N/A 04/23/2017   Procedure: RADIOACTIVE SEED IMPLANT/BRACHYTHERAPY IMPLANT;  Surgeon:  Franchot Gallo, MD;  [facility];  Service: Urology;  Laterality: N/A;  . SPACE OAR INSTILLATION N/A 04/23/2017   Procedure: SPACE OAR INSTILLATION;  Surgeon: Franchot Gallo, MD;  Location: [facility];  Service: Urology;  Laterality: N/A;  . TRANSTHORACIC ECHOCARDIOGRAM  03/28/2009   mild LVH, ef 55-60%/ mild aorta calcification  . VIDEO ASSISTED THORACOSCOPY (VATS)/DECORTICATION Left      SOCIAL HISTORY:  Social History   Socioeconomic History  . Marital status: Married    Spouse name: Not on file  . Number of children: Not on file  . Years of education: Not on file  . Highest education level: Not on file  Occupational History  . Not on file  Social Needs  . Financial resource strain: Not on file  . Food insecurity:    Worry: Not on file    Inability: Not on file  . Transportation needs:    Medical: Not on file    Non-medical: Not on file  Tobacco Use  . Smoking status: Former Smoker    Packs/day: 1.00    Years: 30.00    Pack years: 30.00    Types: Cigarettes, E-cigarettes    Last attempt to quit: 10/03/2015    Years since quitting: 2.7  . Smokeless tobacco: Never Used  . Tobacco comment: stoped vaping 2018  Substance and Sexual Activity  . Alcohol use: Yes    Alcohol/week: 21.0 standard drinks    Types: 21 Shots of liquor per week    Comment: 2-3 drinks daily (Wine)   . Drug use: No  . Sexual activity: Yes    Birth control/protection: None  Lifestyle  . Physical activity:    Days per week: Not on file    Minutes per session: Not on file  . Stress: Not on file  Relationships  . Social connections:    Talks on phone: Not on file    Gets together: Not on file    Attends religious service: Not on file    Active member of club or organization: Not on file    Attends meetings of clubs or organizations: Not on file    Relationship status: Not on file  . Intimate partner violence:    Fear of current or ex partner: Not  on file    Emotionally abused: Not on file    Physically abused: Not on file    Forced sexual activity: Not on file  Other Topics Concern  . Not on file  Social History Narrative  . Not on file    FAMILY HISTORY:  Family History  Problem Relation Age of Onset  . Cancer Neg Hx   . Colon cancer Neg Hx   . Colon polyps Neg Hx   . Neuropathy Neg Hx     CURRENT MEDICATIONS:  Outpatient Encounter Medications as of 06/24/2018  Medication Sig  . albuterol (PROVENTIL  HFA;VENTOLIN HFA) 108 (90 Base) MCG/ACT inhaler Inhale 2 puffs into the lungs every 4 (four) hours as needed for wheezing or shortness of breath.  Marland Kitchen albuterol (PROVENTIL) (2.5 MG/3ML) 0.083% nebulizer solution Take 3 mLs (2.5 mg total) by nebulization every 6 (six) hours as needed for wheezing or shortness of breath.  . Fluticasone-Umeclidin-Vilant (TRELEGY ELLIPTA) 100-62.5-25 MCG/INH AEPB Inhale 1 puff into the lungs daily.  . rivaroxaban (XARELTO) 20 MG TABS tablet Take 20 mg by mouth daily with supper.  . [DISCONTINUED] pantoprazole (PROTONIX) 40 MG tablet Take 1 tablet (40 mg total) by mouth daily. Take 30 minutes before breakfast  . [DISCONTINUED] XARELTO STARTER PACK 15 & 20 MG TBPK    No facility-administered encounter medications on file as of 06/24/2018.     ALLERGIES:  No Known Allergies   PHYSICAL EXAM:  ECOG Performance status: 1  Vitals:   06/24/18 1454  BP: 138/80  Pulse: 82  Resp: 16  SpO2: 98%   Filed Weights   06/24/18 1454  Weight: 226 lb (102.5 kg)    Physical Exam  Constitutional: He is oriented to person, place, and time. He appears well-developed and well-nourished.  Musculoskeletal: Normal range of motion.  Neurological: He is alert and oriented to person, place, and time.  Skin: Skin is warm and dry.  Psychiatric: He has a normal mood and affect. His behavior is normal. Judgment and thought content normal.     LABORATORY DATA:  I have reviewed the labs as listed.  CBC      Component Value Date/Time   WBC 7.5 04/23/2018 1325   RBC 4.26 04/23/2018 1325   HGB 14.3 04/23/2018 1325   HCT 42.9 04/23/2018 1325   PLT 146 (L) 04/23/2018 1325   MCV 100.7 (H) 04/23/2018 1325   MCH 33.6 04/23/2018 1325   MCHC 33.3 04/23/2018 1325   RDW 12.8 04/23/2018 1325   LYMPHSABS 2.4 06/23/2017 0045   MONOABS 0.9 06/23/2017 0045   EOSABS 0.4 06/23/2017 0045   BASOSABS 0.0 06/23/2017 0045   CMP Latest Ref Rng & Units 04/23/2018 06/23/2017 04/20/2017  Glucose 70 - 99 mg/dL 127(H) 99 111(H)  BUN 8 - 23 mg/dL 15 13 8   Creatinine 0.61 - 1.24 mg/dL 1.39(H) 0.80 1.00  Sodium 135 - 145 mmol/L 141 141 137  Potassium 3.5 - 5.1 mmol/L 4.1 4.0 4.0  Chloride 98 - 111 mmol/L 110 107 99(L)  CO2 22 - 32 mmol/L 25 25 25   Calcium 8.9 - 10.3 mg/dL 8.8(L) 9.6 9.5  Total Protein 6.5 - 8.1 g/dL - 7.4 7.7  Total Bilirubin 0.3 - 1.2 mg/dL - 0.8 1.1  Alkaline Phos 38 - 126 U/L - 52 59  AST 15 - 41 U/L - 31 45(H)  ALT 17 - 63 U/L - 31 41       DIAGNOSTIC IMAGING:  I have reviewed his CT of the chest from 03/18/2018.     ASSESSMENT & PLAN:   DVT (deep venous thrombosis) ([place]) 1.  Right leg DVT (weakly provoked): - Patient reportedly had a 4-hour flight and felt cramping in his right leg for the next 4 days. - An ultrasound done on 05/04/2018 showed occlusive DVT in the right popliteal and peroneal veins. - He reports that he was checked 15 years ago for factor V Leiden and was positive.  His daughter was positive and had a history of multiple DVTs and PEs. - He was started on Xarelto and he is tolerating it well.  He no longer  has right leg pains. -He never had any prior history of thromboembolism. -He smoked 2 packs/day for 45 years, quit 2 to 3 years ago.  His job involves sitting at a desk. - I discussed the results of his blood work today.  Lupus anticoagulant was positive.  But this could be positive because of Xarelto.  He is heterozygous for factor V Leiden mutation.  That does  not alter the duration of anticoagulation. - I have recommended rechecking lupus anticoagulant 3 to 4 weeks after discontinuing Xarelto.  He will complete 6 months of Xarelto and of April next year.  We plan to see him back end of May after repletion of lupus anticoagulant.  2.  Prostate cancer: - Reportedly received seed implantation and brachytherapy in October 2018.  Last PSA was reportedly undetectable.  He follows up with Dr. Diona Fanti.  3.  Smoking history: -Patient had a PET CT scan on 03/23/2018 followed by CT of the chest on 03/18/2018. - He needs to have a repeat CT scan of the chest without contrast in the next few days.      Orders placed this encounter:  Orders Placed This Encounter  Procedures  . D-dimer, quantitative  . Lupus anticoagulant panel      Derek Jack, MD [place] [phone]

## 2018-06-24 NOTE — Patient Instructions (Signed)
[place] at [facility] Discharge Instructions  STOP YOUR BLOOD THINNER THE LAST WEEK IN April Follow up with [place] at the end of May. Have you labs drawn one week prior to your appointment.   Thank you for choosing [place] at [facility] to provide your oncology and hematology care.  To afford each patient quality time with our provider, please arrive at least 15 minutes before your scheduled appointment time.   If you have a lab appointment with the [facility] please come in thru the  Main Entrance and check in at the main information desk  You need to re-schedule your appointment should you arrive 10 or more minutes late.  We strive to give you quality time with our providers, and arriving late affects you and other patients whose appointments are after yours.  Also, if you no show three or more times for appointments you may be dismissed from the clinic at the providers discretion.     Again, thank you for choosing [facility].  Our hope is that these requests will decrease the amount of time that you wait before being seen by our physicians.       _____________________________________________________________  Should you have questions after your visit to [facility], please contact our office at [phone] between the hours of 8:00 a.m. and 4:30 p.m.  Voicemails left after 4:00 p.m. will not be returned until the following business day.  For prescription refill requests, have your pharmacy contact our office and allow 72 hours.    Cancer Center Support Programs:   > Cancer Support Group  2nd Tuesday of the month 1pm-2pm, Journey Room

## 2018-06-25 ENCOUNTER — Ambulatory Visit (HOSPITAL_COMMUNITY): Admission: RE | Admit: 2018-06-25 | Payer: 59 | Source: Ambulatory Visit

## 2018-07-06 NOTE — Progress Notes (Deleted)
[name] DOB: 11/17/1955   REASON FOR VISIT: Obstructive sleep apnea here for  CPAP compliance HISTORY FROM: Patient    HISTORY OF PRESENT ILLNESS:UPDATE 5/29/2019[name], 62 year old male returns for follow-up with history of obstructive sleep apnea here for CPAP compliance.  He denies any problems with his CPAP.  CPAP data dated 11/29/2017-12/28/2017 shows greater than 4 hours 87%.  Average usage 5 hours 47 minutes.  Set pressure 5 to 18 cm.  EPR level 3 AHI 3.4 leaks 95th percentile at 10.5.  He returns for reevaluation   UPDATE 2/26/2019[name], 62 year old male returns for follow-up with history of newly diagnosed obstructive sleep apnea.  He is here for his initial CPAP compliance.  He states he had a period of time to get adjusted to the CPAP but he feels he is adjusted now.  He complains of his mask leaking at times even though it is tight.  He does have a beard.  Compliance data dated 08/30/2017 to 25 2019 shows compliance greater than 4 hours at 83%.  Average usage 5 hours 48 minutes.  Set pressure 5-18 cm.  EPR level 3 AHI 4.4 95th percentile leak at 14.3.  He returns for reevaluation 06/02/17/CDGregory L Capobianco is a 62 y.o. male , seen here as in a referral   from Dr. Karie Kirks for sleep apnea. [name] medical history is positive for a lung abscess in the year 2008, a pneumothorax in 1983, bleeding hemorrhoids in 2010, surgical treatment. Shoulder surgery 2008 on  both sides- Not related to the rotator cuff. Prostate cancer with seed implants earlier this year 2018. The procedure was just done on September 20. [name] has also seen my colleague Dr. Sarina Ill.   [name] presents today mainly because of his loud snoring, which has been brought to his attention by his family and friends. He can also fall asleep anywhere anytime. He reported an anicteric of falling asleep by being out on a [place] in [place]. He is fatigued.  He falls asleep watching TV and his family leaves the room- they can not hear the movies plot.    Sleep habits are as follows: Usually the patient spends the last hour before going to bed either watching TV or reading, and often falling asleep during it. He sleeps in a fetal position on his left side- on 2 pillows. The bedroom is cool, quiet and dark.  Sometimes he will stay put in his den for the whole night. He will usually transfer to the bedroom before midnight, and has no trouble falling asleep in his bed again. Since he had the prostate, the treatment he developed nocturia,goes now to the bathroom for about 3-4 times each night. This was not the case prior. Usually nocturia happens every 2 hours.Occasionally he will also wake himself choking or short of air, and with the feeling of an extremely dry mouth. His snoring has been described as thunderous, and his breathing is irregular. His wife has noted him always moving his legs at night- a condition that was already known in childhood. Has not kept him from sleeping.  He typically rises at 4 or 5 AM in the morning, waking up spontaneously without alarm.  REVIEW OF SYSTEMS: Full 14 system review of systems performed and notable only for those listed, all others are neg:  Constitutional: neg  Cardiovascular: neg Ear/Nose/Throat: neg  Skin: neg Eyes: neg Respiratory: neg Gastroitestinal: neg  Hematology/Lymphatic: neg  Endocrine: neg Musculoskeletal: Back pain Allergy/Immunology: neg Neurological: neg Psychiatric: neg Sleep : Obstructive sleep apnea with CPAP   ALLERGIES: No Known Allergies  HOME MEDICATIONS: Outpatient Medications Prior to Visit  Medication Sig Dispense Refill  . albuterol (PROVENTIL HFA;VENTOLIN HFA) 108 (90 Base) MCG/ACT inhaler Inhale 2 puffs into the lungs every 4 (four) hours as needed for wheezing or shortness of breath. 1 Inhaler 5  . albuterol (PROVENTIL) (2.5 MG/3ML) 0.083%  nebulizer solution Take 3 mLs (2.5 mg total) by nebulization every 6 (six) hours as needed for wheezing or shortness of breath. 300 mL 5  . Fluticasone-Umeclidin-Vilant (TRELEGY ELLIPTA) 100-62.5-25 MCG/INH AEPB Inhale 1 puff into the lungs daily. 60 each 0  . rivaroxaban (XARELTO) 20 MG TABS tablet Take 20 mg by mouth daily with supper.     No facility-administered medications prior to visit.     PAST MEDICAL HISTORY: Past Medical History:  Diagnosis Date  . Bilateral shoulder bursitis   . COPD (chronic obstructive pulmonary disease) with emphysema ([place])   . DDD (degenerative disc disease), lumbar    hx epideral injection  L5--S1   . Dysphasia    intermittant w/ food   . Dyspnea   . Dyspnea on exertion   . GERD (gastroesophageal reflux disease)   . Hiatal hernia   . History of acute pancreatitis    alcoholic pancreatitis [id number] and 01-30-2012  . History of colon polyps    10-17-2005  hyperplastic polyp's  . History of esophageal stricture    s/p  dilation 02-02-2017  . History of peptic ulcer 1990s  . History of pneumococcal pneumonia 2006   bilateral pneumonia w/ left lower lobe abscess treated w/ chest tube with suction for drainage  . History of pneumothorax    1984--  left spontaneous pneumothorax ,  treated w/ chest tube  . Hypertension   . Malignant neoplasm of prostate [facility]) urologist-  dr dahlstedt/  oncologist -- dr Tammi Klippel   dx 12-02-2016-- Stage T2b, Gleason 3+4,  PSA 5.99,  vol 25cc  . Numbness of left foot    outer left ankle numb-- per pt has appt. w/ neurologist  . OSA (obstructive sleep apnea)    per pt has not used cpap over year ago from 04-16-2017--- per study 01-17-2010  severe osa  . Solid nodule of lung greater than 8 mm in diameter 05/11/2018   03/2018: RUL Nodule, 58mm  . Wears glasses     PAST SURGICAL HISTORY: Past Surgical History:  Procedure Laterality Date  . BIOPSY  02/02/2017   Procedure: BIOPSY;  Surgeon: Daneil Dolin, MD;  [facility];  Service: Endoscopy;;  duodenal bx's, esophgeal bx's  . BIOPSY  07/30/2017   Procedure: BIOPSY;  Surgeon: Daneil Dolin, MD;  [facility]: [facility];  Service: Endoscopy;;  esophagus  . CARDIOVASCULAR STRESS TEST  09/12/2009   normal nuclear perfusion study w/ no ischemia /  normal LV function and wall motion , ef 57%  . COLONOSCOPY  2007   Dr. Gala Romney: hyperplastic polyps, internal hemorrhoids   . COLONOSCOPY WITH PROPOFOL N/A 05/03/2018   Procedure: COLONOSCOPY WITH PROPOFOL;  Surgeon: Daneil Dolin, MD;  [facility];  Service: Endoscopy;  Laterality: N/A;  2:00pm  . ESOPHAGOGASTRODUODENOSCOPY (EGD) WITH PROPOFOL N/A 02/02/2017   Dr. Gala Romney: esophageal stenosis s/p dilation and biopsy, medium sized hiatal hernia, normal duodenum  . ESOPHAGOGASTRODUODENOSCOPY (EGD) WITH PROPOFOL N/A 07/30/2017   Dr. Gala Romney: esophageal stenosis s/p dilation  and biopsy, medium-sized hiatal hernia, normal duodenum  . HEMORRHOID SURGERY  01-21-2008    [facility]  . MALONEY DILATION N/A 02/02/2017   Procedure: Venia Minks DILATION;  Surgeon: Daneil Dolin, MD;  [facility];  Service: Endoscopy;  Laterality: N/A;  . Venia Minks DILATION N/A 07/30/2017   Procedure: Venia Minks DILATION;  Surgeon: Daneil Dolin, MD;  [facility];  Service: Endoscopy;  Laterality: N/A;  . POLYPECTOMY  05/03/2018   Procedure: POLYPECTOMY;  Surgeon: Daneil Dolin, MD;  [facility];  Service: Endoscopy;;  ascending colon polyp, sigmoid colon polyps (multiple)  . RADIOACTIVE SEED IMPLANT N/A 04/23/2017   Procedure: RADIOACTIVE SEED IMPLANT/BRACHYTHERAPY IMPLANT;  Surgeon: Franchot Gallo, MD;  Location[facility];  Service: Urology;  Laterality: N/A;  . SPACE OAR INSTILLATION N/A 04/23/2017   Procedure: SPACE OAR INSTILLATION;  Surgeon: Franchot Gallo, MD;  Location: [facility];  Service: Urology;  Laterality: N/A;  . TRANSTHORACIC ECHOCARDIOGRAM   03/28/2009   mild LVH, ef 55-60%/ mild aorta calcification  . VIDEO ASSISTED THORACOSCOPY (VATS)/DECORTICATION Left     FAMILY HISTORY: Family History  Problem Relation Age of Onset  . Cancer Neg Hx   . Colon cancer Neg Hx   . Colon polyps Neg Hx   . Neuropathy Neg Hx     SOCIAL HISTORY: Social History   Socioeconomic History  . Marital status: Married    Spouse name: Not on file  . Number of children: Not on file  . Years of education: Not on file  . Highest education level: Not on file  Occupational History  . Not on file  Social Needs  . Financial resource strain: Not on file  . Food insecurity:    Worry: Not on file    Inability: Not on file  . Transportation needs:    Medical: Not on file    Non-medical: Not on file  Tobacco Use  . Smoking status: Former Smoker    Packs/day: 1.00    Years: 30.00    Pack years: 30.00    Types: Cigarettes, E-cigarettes    Last attempt to quit: 10/03/2015    Years since quitting: 2.7  . Smokeless tobacco: Never Used  . Tobacco comment: stoped vaping 2018  Substance and Sexual Activity  . Alcohol use: Yes    Alcohol/week: 21.0 standard drinks    Types: 21 Shots of liquor per week    Comment: 2-3 drinks daily (Wine)   . Drug use: No  . Sexual activity: Yes    Birth control/protection: None  Lifestyle  . Physical activity:    Days per week: Not on file    Minutes per session: Not on file  . Stress: Not on file  Relationships  . Social connections:    Talks on phone: Not on file    Gets together: Not on file    Attends religious service: Not on file    Active member of club or organization: Not on file    Attends meetings of clubs or organizations: Not on file    Relationship status: Not on file  . Intimate partner violence:    Fear of current or ex partner: Not on file    Emotionally abused: Not on file    Physically abused: Not on file    Forced sexual activity: Not on file  Other Topics Concern  . Not on file    Social History Narrative  . Not on file  PHYSICAL EXAM  There were no vitals filed for this visit. There is no height or weight on file to calculate BMI.  Generalized: Well developed, in no acute distress  Head: normocephalic and atraumatic,. Oropharynx benign malopatti 5 Neck: Supple, 18.5 Musculoskeletal: No deformity   Neurological examination   Mentation: Alert oriented to time, place, history taking. Attention span and concentration appropriate. Recent and remote memory intact.  Follows all commands speech and language fluent.   Cranial nerve II-XII: .Pupils were equal round reactive to light extraocular movements were full, visual field were full on confrontational test. Facial sensation and strength were normal. hearing was intact to finger rubbing bilaterally. Uvula tongue midline. head turning and shoulder shrug were normal and symmetric.Tongue protrusion into cheek strength was normal. Motor: normal bulk and tone, full strength in the BUE, BLE,  Sensory: normal and symmetric to light touch,  Coordination: finger-nose-finger, heel-to-shin bilaterally, no dysmetria Gait and Station: Rising up from seated position without assistance, normal stance,  moderate stride, good arm swing, smooth turning, able to perform tiptoe, and heel walking without difficulty. Tandem gait is steady  DIAGNOSTIC DATA (LABS, IMAGING, TESTING) - I reviewed patient records, labs, notes, testing and imaging myself where available.  Lab Results  Component Value Date   WBC 7.5 04/23/2018   HGB 14.3 04/23/2018   HCT 42.9 04/23/2018   MCV 100.7 (H) 04/23/2018   PLT 146 (L) 04/23/2018      Component Value Date/Time   NA 141 04/23/2018 1325   K 4.1 04/23/2018 1325   CL 110 04/23/2018 1325   CO2 25 04/23/2018 1325   GLUCOSE 127 (H) 04/23/2018 1325   BUN 15 04/23/2018 1325   CREATININE 1.39 (H) 04/23/2018 1325   CALCIUM 8.8 (L) 04/23/2018 1325   PROT 7.4 06/23/2017 0045   ALBUMIN 3.9  06/23/2017 0045   AST 31 06/23/2017 0045   ALT 31 06/23/2017 0045   ALKPHOS 52 06/23/2017 0045   BILITOT 0.8 06/23/2017 0045   GFRNONAA 53 (L) 04/23/2018 1325   GFRAA >60 04/23/2018 1325   Lab Results  Component Value Date   CHOL 180 11/13/2011   HDL 62 11/13/2011   LDLCALC 99 11/13/2011   TRIG 95 11/13/2011   CHOLHDL 2.9 11/13/2011    Lab Results  Component Value Date   TSH 1.34 09/12/2009      ASSESSMENT AND PLAN  62 y.o. year old male here to follow-up for  OSA (obstructive sleep apnea), with  CPAP compliance.Data  dated 11/29/2017-12/28/2017 shows greater than 4 hours 87%.  Average usage 5 hours 47 minutes.  Set pressure 5 to 18 cm.  EPR level 3 AHI 3.4   PLAN: CPAP compliance 87% Continue same settings F/U in 6 months for repeat compliance check then yearly Dennie Bible, [facility], [facility], [facility] [address], [place], [place] [address] [phone]

## 2018-07-07 ENCOUNTER — Encounter: Payer: Self-pay | Admitting: Pulmonary Disease

## 2018-07-07 ENCOUNTER — Ambulatory Visit (INDEPENDENT_AMBULATORY_CARE_PROVIDER_SITE_OTHER): Payer: 59 | Admitting: Pulmonary Disease

## 2018-07-07 VITALS — BP 132/88 | HR 92 | Ht 73.0 in | Wt 224.8 lb

## 2018-07-07 DIAGNOSIS — Z9989 Dependence on other enabling machines and devices: Secondary | ICD-10-CM

## 2018-07-07 DIAGNOSIS — R911 Solitary pulmonary nodule: Secondary | ICD-10-CM

## 2018-07-07 DIAGNOSIS — J449 Chronic obstructive pulmonary disease, unspecified: Secondary | ICD-10-CM | POA: Diagnosis not present

## 2018-07-07 DIAGNOSIS — G4733 Obstructive sleep apnea (adult) (pediatric): Secondary | ICD-10-CM

## 2018-07-07 DIAGNOSIS — Z87891 Personal history of nicotine dependence: Secondary | ICD-10-CM

## 2018-07-07 DIAGNOSIS — J432 Centrilobular emphysema: Secondary | ICD-10-CM

## 2018-07-07 DIAGNOSIS — I82431 Acute embolism and thrombosis of right popliteal vein: Secondary | ICD-10-CM | POA: Diagnosis not present

## 2018-07-07 MED ORDER — FLUTICASONE-UMECLIDIN-VILANT 100-62.5-25 MCG/INH IN AEPB: 1.0000 | INHALATION_SPRAY | Freq: Every day | RESPIRATORY_TRACT | 6 refills | Status: DC

## 2018-07-07 NOTE — Progress Notes (Signed)
Synopsis: Referred in August 2019 for COPD, RUL pulmonary nodule by [name], MD  Subjective:   PATIENT ID: [name] Needs GENDER: male DOB: 30-Dec-1955, MRN: [id number]  Chief Complaint  Patient presents with  . Consult    States his breathing has been good. Denies any new concerns.     Initial office visit: Patient has a past medical history of obstructive sleep apnea, prostate cancer as well as tobacco abuse and has been labeled with COPD in the past.  No prior PFTs.  He was recently seen by his PCP with complaints of worsening shortness of breath, 35-pack-year history of smoking, quit smoking 2 years ago, started vaping but recently quit vaping approximately 3 to 4 months ago. He is active outside, plays golf regularly and swims. 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 He gets short winded, SOB and DOE with any exertion. He feels like he has had to slow down due to his symptoms. 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 He has noticed people asking is he is "ok" are you breathing okay situation. He has a had a few episodes where he was seen in the ED. He was at work and couldn't get his breath and he was transported from work.  He has never been intubated or placed on mechanical ventilation.  He has never needed NIPPV for an exacerbation.  OV 05/12/2018: Since our last visit a PET scan was completed for further evaluation of the lung nodule seen on initial LDCT. This revealed low level uptake now with planned follow up. However there was abnormal uptake within the colon which led to a C-scope by GI revealing multiple polyps, tubular adenomas, no cancer detected and colonic diverticulosis. On 10/1 he had a LE Duplex + Right Popliteal DVT and he was started on Xarelto.  This was considered a provoked DVT following a 4-hour flight coming back from [place].  He did state that his breathing was difficult while he was in [place].  He did have  difficulty with exertion and climbing out of the [facility] when he was out with a group sightseeing.  At this point denies any additional chest pain, wheezing or chest tightness.  He has been using his medication regularly.  OV 07/07/2018: He was started on Trelegy after last office visit.  He is here today for follow-up to make sure that he is still doing well with his breathing.  He is very excited to tell me that he is feeling much better.  He has noticed a significant difference in his ability to walk further and get more done at work.  He still been able to play golf on occasion.  He feels like he is less breathless with significant exertion.  He denies any ongoing cough.  He is somewhat anxious about everything that has happened this past year.  After finding the lung nodule that needs to be followed up on CT as well as the PET scan which led to evaluation of the colon and the multiple colon polyps.  He also has follow-up with GI shortly to discuss this again.  He also developed a blood clot and currently on Xarelto.  He also has follow-up with hematology after he completes therapy with his Xarelto.    Past Medical History:  Diagnosis Date  . Bilateral shoulder bursitis   . COPD (chronic obstructive pulmonary disease) with emphysema ([place])   . DDD (degenerative disc disease), lumbar  hx epideral injection  L5--S1   . Dysphasia    intermittant w/ food   . Dyspnea   . Dyspnea on exertion   . GERD (gastroesophageal reflux disease)   . Hiatal hernia   . History of acute pancreatitis    alcoholic pancreatitis [id number] and 01-30-2012  . History of colon polyps    10-17-2005  hyperplastic polyp's  . History of esophageal stricture    s/p  dilation 02-02-2017  . History of peptic ulcer 1990s  . History of pneumococcal pneumonia 2006   bilateral pneumonia w/ left lower lobe abscess treated w/ chest tube with suction for drainage  . History of pneumothorax    1984--  left spontaneous  pneumothorax ,  treated w/ chest tube  . Hypertension   . Malignant neoplasm of prostate [facility]) urologist-  dr dahlstedt/  oncologist -- dr Tammi Klippel   dx 12-02-2016-- Stage T2b, Gleason 3+4,  PSA 5.99,  vol 25cc  . Numbness of left foot    outer left ankle numb-- per pt has appt. w/ neurologist  . OSA (obstructive sleep apnea)    per pt has not used cpap over year ago from 04-16-2017--- per study 01-17-2010  severe osa  . Solid nodule of lung greater than 8 mm in diameter 05/11/2018   03/2018: RUL Nodule, 74mm  . Wears glasses      Family History  Problem Relation Age of Onset  . Cancer Neg Hx   . Colon cancer Neg Hx   . Colon polyps Neg Hx   . Neuropathy Neg Hx      Social History   Socioeconomic History  . Marital status: Married    Spouse name: Not on file  . Number of children: Not on file  . Years of education: Not on file  . Highest education level: Not on file  Occupational History  . Not on file  Social Needs  . Financial resource strain: Not on file  . Food insecurity:    Worry: Not on file    Inability: Not on file  . Transportation needs:    Medical: Not on file    Non-medical: Not on file  Tobacco Use  . Smoking status: Former Smoker    Packs/day: 1.00    Years: 30.00    Pack years: 30.00    Types: Cigarettes, E-cigarettes    Last attempt to quit: 10/03/2015    Years since quitting: 2.7  . Smokeless tobacco: Never Used  . Tobacco comment: stoped vaping 2018  Substance and Sexual Activity  . Alcohol use: Yes    Alcohol/week: 21.0 standard drinks    Types: 21 Shots of liquor per week    Comment: 2-3 drinks daily (Wine)   . Drug use: No  . Sexual activity: Yes    Birth control/protection: None  Lifestyle  . Physical activity:    Days per week: Not on file    Minutes per session: Not on file  . Stress: Not on file  Relationships  . Social connections:    Talks on phone: Not on file    Gets together: Not on file    Attends religious service: Not on  file    Active member of club or organization: Not on file    Attends meetings of clubs or organizations: Not on file    Relationship status: Not on file  . Intimate partner violence:    Fear of current or ex partner: Not on file    Emotionally abused:  Not on file    Physically abused: Not on file    Forced sexual activity: Not on file  Other Topics Concern  . Not on file  Social History Narrative  . Not on file     No Known Allergies   Outpatient Medications Prior to Visit  Medication Sig Dispense Refill  . albuterol (PROVENTIL HFA;VENTOLIN HFA) 108 (90 Base) MCG/ACT inhaler Inhale 2 puffs into the lungs every 4 (four) hours as needed for wheezing or shortness of breath. 1 Inhaler 5  . albuterol (PROVENTIL) (2.5 MG/3ML) 0.083% nebulizer solution Take 3 mLs (2.5 mg total) by nebulization every 6 (six) hours as needed for wheezing or shortness of breath. 300 mL 5  . Fluticasone-Umeclidin-Vilant (TRELEGY ELLIPTA) 100-62.5-25 MCG/INH AEPB Inhale 1 puff into the lungs daily. 60 each 0  . rivaroxaban (XARELTO) 20 MG TABS tablet Take 20 mg by mouth daily with supper.     No facility-administered medications prior to visit.     Review of Systems  Constitutional: Negative for chills, fever, malaise/fatigue and weight loss.  HENT: Negative for hearing loss, sore throat and tinnitus.   Eyes: Negative for blurred vision and double vision.  Respiratory: Positive for shortness of breath. Negative for cough, hemoptysis, sputum production, wheezing and stridor.   Cardiovascular: Negative for chest pain, palpitations, orthopnea, leg swelling and PND.  Gastrointestinal: Negative for abdominal pain, constipation, diarrhea, heartburn, nausea and vomiting.  Genitourinary: Negative for dysuria, hematuria and urgency.  Musculoskeletal: Negative for joint pain and myalgias.  Skin: Negative for itching and rash.  Neurological: Negative for dizziness, tingling, weakness and headaches.    Endo/Heme/Allergies: Negative for environmental allergies. Does not bruise/bleed easily.  Psychiatric/Behavioral: Negative for depression. The patient is not nervous/anxious and does not have insomnia.   All other systems reviewed and are negative.    Objective:  Physical Exam  Constitutional: He is oriented to person, place, and time. He appears well-developed. No distress.  HENT:  Head: Normocephalic and atraumatic.  Mouth/Throat: Oropharynx is clear and moist. No oropharyngeal exudate.  Eyes: Pupils are equal, round, and reactive to light. Conjunctivae and EOM are normal.  Neck: No JVD present. No tracheal deviation present.  Loss of supraclavicular fat  Cardiovascular: Normal rate, regular rhythm, S1 normal, S2 normal and intact distal pulses.  Distant heart tones  Pulmonary/Chest: No accessory muscle usage or stridor. No tachypnea. He has decreased breath sounds (throughout all lung fields). He has no wheezes. He has no rhonchi. He has no rales.  Increased AP chest diameter  Abdominal: Soft. Bowel sounds are normal. He exhibits no distension. There is no tenderness.  Musculoskeletal: He exhibits no edema or deformity.  Neurological: He is alert and oriented to person, place, and time.  Skin: Skin is warm and dry. Capillary refill takes less than 2 seconds. No rash noted.  Psychiatric: He has a normal mood and affect. His behavior is normal.  Vitals reviewed.   Vitals:   07/07/18 0956  BP: 132/88  Pulse: 92  SpO2: 96%  Weight: 224 lb 12.8 oz (102 kg)  Height: 6\' 1"  (1.854 m)   96% on RA BMI Readings from Last 3 Encounters:  07/07/18 29.66 kg/m  06/24/18 29.02 kg/m  05/14/18 29.66 kg/m   Wt Readings from Last 3 Encounters:  07/07/18 224 lb 12.8 oz (102 kg)  06/24/18 226 lb (102.5 kg)  05/14/18 231 lb (104.8 kg)     CBC    Component Value Date/Time   WBC 7.5 04/23/2018 1325  RBC 4.26 04/23/2018 1325   HGB 14.3 04/23/2018 1325   HCT 42.9 04/23/2018 1325    PLT 146 (L) 04/23/2018 1325   MCV 100.7 (H) 04/23/2018 1325   MCH 33.6 04/23/2018 1325   MCHC 33.3 04/23/2018 1325   RDW 12.8 04/23/2018 1325   LYMPHSABS 2.4 06/23/2017 0045   MONOABS 0.9 06/23/2017 0045   EOSABS 0.4 06/23/2017 0045   BASOSABS 0.0 06/23/2017 0045     Chest Imaging: LDCT 03/2018 - RUL Lung nodule, approximately 46mm with areas of surrounding scar, significant BL upper lobe predominant emphysema The patient's images have been independently reviewed by me.    PET Scan: 03/23/2018 Mild uptake in the RUL density. Extensive colonic uptake from PET scan  07/20/2018: CT chest - pending  Pulmonary Functions Testing Results: PFT Results Latest Ref Rng & Units 03/22/2018  FVC-Pre L 3.30  FVC-Predicted Pre % 63  FVC-Post L 3.40  FVC-Predicted Post % 65  Pre FEV1/FVC % % 64  Post FEV1/FCV % % 69  FEV1-Pre L 2.12  FEV1-Predicted Pre % 54  FEV1-Post L 2.36  DLCO UNC% % 52  DLCO COR %Predicted % 69  TLC L 4.69  TLC % Predicted % 62  RV % Predicted % 38    FeNO: None  Pathology: None   Echocardiogram: None   Heart Catheterization: None     Assessment & Plan:   Solid nodule of lung greater than 8 mm in diameter  Deep vein thrombosis (DVT) of popliteal vein of right lower extremity, unspecified chronicity ([name])  Moderate COPD (chronic obstructive pulmonary disease) ([name])  Nodule of upper lobe of right lung  Obstructive sleep apnea treated with continuous positive airway pressure (CPAP)  Centrilobular emphysema ([place])  Former smoker  Discussion:  This is a 62 year old with moderate COPD doing well on Trelegy.  He has a right upper lobe pulmonary nodule with low SUV 1.3 uptake on prior pet imaging.  He has planned CT image follow-up in a few weeks.  We will call him with results of the CT.  If the nodule was to be enlarging would consider referral to cardiothoracic surgery for resection if patient was interested.  We discussed the options in the office today  of what would happen if the nodule was in fact increasing.  We discussed options to include candidacy evaluation for lobectomy as well as potential for percutaneous versus bronchoscopic biopsy.  He has follow-up with GI and I encouraged him to discuss their plans regarding colonic polyp follow-up.  As for his Xarelto he should stay on this until he completes 3 months of anticoagulation follows up with the hematologist at [facility].  Should have follow-up with our outpatient sleep clinic and continue current CPAP therapy  Once we get CT imaging we will make further recommendations.  As for now will need at least 9-month follow-up to be scheduled.  Greater than 50% of this patient's 40-minute office visit was spent face-to-face counseling on above recommendations treat pain.   Current Outpatient Medications:  .  albuterol (PROVENTIL HFA;VENTOLIN HFA) 108 (90 Base) MCG/ACT inhaler, Inhale 2 puffs into the lungs every 4 (four) hours as needed for wheezing or shortness of breath., Disp: 1 Inhaler, Rfl: 5 .  albuterol (PROVENTIL) (2.5 MG/3ML) 0.083% nebulizer solution, Take 3 mLs (2.5 mg total) by nebulization every 6 (six) hours as needed for wheezing or shortness of breath., Disp: 300 mL, Rfl: 5 .  Fluticasone-Umeclidin-Vilant (TRELEGY ELLIPTA) 100-62.5-25 MCG/INH AEPB, Inhale 1 puff into the lungs  daily., Disp: 60 each, Rfl: 0 .  rivaroxaban (XARELTO) 20 MG TABS tablet, Take 20 mg by mouth daily with supper., Disp: , Rfl:    Garner Nash, DO [facility] 07/07/2018 10:14 AM

## 2018-07-07 NOTE — Patient Instructions (Addendum)
Thank you for visiting Dr. Valeta Harms at [facility]. Today we recommend the following:  Lung nodule CT Chest follow up 07/20/2018  4:00 PM at [place]  I will call you with the CT results and if we need to see you sooner.   Meds ordered this encounter  Medications  . Fluticasone-Umeclidin-Vilant (TRELEGY ELLIPTA) 100-62.5-25 MCG/INH AEPB    Sig: Inhale 1 puff into the lungs daily.    Dispense:  60 each    Refill:  6   Return in about 3 months (around 10/06/2018).

## 2018-07-08 ENCOUNTER — Ambulatory Visit: Payer: 59 | Admitting: Nurse Practitioner

## 2018-07-08 ENCOUNTER — Telehealth: Payer: Self-pay | Admitting: *Deleted

## 2018-07-08 NOTE — Telephone Encounter (Signed)
Patient's FU this morning had to be rescheduled due to computer system down.  Calld patient and informed him his CPAP FU was rescheduled to 09/02/18, and he will get a reminder call.  Thanked him for his patience and understanding. He  verbalized understanding, appreciation.

## 2018-07-15 ENCOUNTER — Ambulatory Visit (HOSPITAL_COMMUNITY): Payer: 59

## 2018-07-20 ENCOUNTER — Ambulatory Visit (HOSPITAL_COMMUNITY)
Admission: RE | Admit: 2018-07-20 | Discharge: 2018-07-20 | Disposition: A | Discharge status: Home / Self Care | Payer: 59 | Source: Ambulatory Visit | Attending: Pulmonary Disease | Admitting: Pulmonary Disease

## 2018-07-20 DIAGNOSIS — R918 Other nonspecific abnormal finding of lung field: Secondary | ICD-10-CM | POA: Diagnosis not present

## 2018-07-22 ENCOUNTER — Telehealth: Payer: Self-pay | Admitting: Pulmonary Disease

## 2018-07-22 DIAGNOSIS — Z87891 Personal history of nicotine dependence: Secondary | ICD-10-CM

## 2018-07-22 NOTE — Telephone Encounter (Signed)
Notes recorded by Garner Nash, DO on 07/21/2018 at 10:25 AM EST [place], you can let him know that his LDCT is stable. Radiology recommending 12 month annual follow up. Please refer to meet with Eric Form in 12 months from now regarding on-going screening needs and enrollment into our LDCT program. Please inform him that he ct did find coronary calcium deposits consistent with atherosclerosis and that he should follow up with his PCP or cardiologist regarding this.   Thanks,  BLI  Garner Nash, DO [facility] 07/21/2018 10:23 AM   Pt is aware of results and voiced his understanding.  Pt requested that CT be mailed to address on file- verified with pt.  Letter has been placed to outgoing mail.  Ref to lung cancer screening has been placed with note for 39mo. Nothing further is needed.

## 2018-08-04 DIAGNOSIS — M259 Joint disorder, unspecified: Secondary | ICD-10-CM

## 2018-08-04 HISTORY — DX: Joint disorder, unspecified: M25.9

## 2018-08-04 HISTORY — PX: EPIDURAL BLOCK INJECTION: SHX1516

## 2018-08-19 ENCOUNTER — Encounter: Payer: Self-pay | Admitting: Gastroenterology

## 2018-08-19 ENCOUNTER — Ambulatory Visit (INDEPENDENT_AMBULATORY_CARE_PROVIDER_SITE_OTHER): Payer: 59 | Admitting: Gastroenterology

## 2018-08-19 VITALS — BP 126/82 | HR 97 | Temp 98.0°F | Ht 73.0 in | Wt 229.8 lb

## 2018-08-19 DIAGNOSIS — Z86010 Personal history of colonic polyps: Secondary | ICD-10-CM

## 2018-08-19 DIAGNOSIS — K222 Esophageal obstruction: Secondary | ICD-10-CM

## 2018-08-19 NOTE — Progress Notes (Signed)
CC'ED TO PCP 

## 2018-08-19 NOTE — Assessment & Plan Note (Signed)
Multiple adenomas and inflammatory polyps recently. Due for surveillance in 2022. No concerning lower GI signs/symptoms. Return as needed and will place on recall for colonoscopy. Discussed procedure note in detail.

## 2018-08-19 NOTE — Progress Notes (Signed)
Referring Provider: [name], MD Primary Care Physician:  [name], MD  Primary GI: Dr. Gala Romney   Chief Complaint  Patient presents with  . Gastroesophageal Reflux    ok    HPI:   [name] is a 63 y.o. male presenting today with a history of abnormal PET scan done for lung nodule in Aug 2019, with extensive colonic diverticulosis with wall thickening and hypermetabolism within the sigmoid. Colonoscopy then completed with diverticulosis in entire colon, multiple polyps in sigmoid, at splenic flexure, and ascending colon. Not all polyps removed.  Tubular adenomas and inflammatory polyps noted with need for surveillance in 2021.  He also reports a history of PUD in remote past, where he took medications for "12 weeks". I do not have endoscopic evaluation for this, but patient states abnormalities were noted on imaging. He has several EGDs recently on file with history of esophageal stenosis. No dysphagia, no reflux symptoms, would like to not take any additional medications and has stopped PPI.  No abdominal pain, N/V, rectal bleeding, changes in bowel habits. He was diagnosed with a DVT in fall of 2019. Followed by Hematology.     Past Medical History:  Diagnosis Date  . Bilateral shoulder bursitis   . COPD (chronic obstructive pulmonary disease) with emphysema ([place])   . DDD (degenerative disc disease), lumbar    hx epideral injection  L5--S1   . Dysphasia    intermittant w/ food   . Dyspnea   . Dyspnea on exertion   . GERD (gastroesophageal reflux disease)   . Hiatal hernia   . History of acute pancreatitis    alcoholic pancreatitis [id number] and 01-30-2012  . History of colon polyps    10-17-2005  hyperplastic polyp's  . History of esophageal stricture    s/p  dilation 02-02-2017  . History of peptic ulcer 1990s  . History of pneumococcal pneumonia 2006   bilateral pneumonia w/ left lower lobe abscess treated w/ chest tube with suction for  drainage  . History of pneumothorax    1984--  left spontaneous pneumothorax ,  treated w/ chest tube  . Hypertension   . Malignant neoplasm of prostate [facility]) urologist-  dr dahlstedt/  oncologist -- dr Tammi Klippel   dx 12-02-2016-- Stage T2b, Gleason 3+4,  PSA 5.99,  vol 25cc  . Numbness of left foot    outer left ankle numb-- per pt has appt. w/ neurologist  . OSA (obstructive sleep apnea)    per pt has not used cpap over year ago from 04-16-2017--- per study 01-17-2010  severe osa  . Solid nodule of lung greater than 8 mm in diameter 05/11/2018   03/2018: RUL Nodule, 27mm  . Wears glasses     Past Surgical History:  Procedure Laterality Date  . BIOPSY  02/02/2017   Procedure: BIOPSY;  Surgeon: Daneil Dolin, MD;  [facility];  Service: Endoscopy;;  duodenal bx's, esophgeal bx's  . BIOPSY  07/30/2017   Procedure: BIOPSY;  Surgeon: Daneil Dolin, MD;  [facility];  Service: Endoscopy;;  esophagus  . CARDIOVASCULAR STRESS TEST  09/12/2009   normal nuclear perfusion study w/ no ischemia /  normal LV function and wall motion , ef 57%  . COLONOSCOPY  2007   Dr. Gala Romney: hyperplastic polyps, internal hemorrhoids   . COLONOSCOPY WITH PROPOFOL N/A 05/03/2018   Dr. Gala Romney: diverticulosis in entire colon, multiple polyps in sigmoid, at splenic flexure, and ascending colon, not  all polyps removed. Tubular adenomas and inflammatory polyps. Needs 3 year surveilance  . ESOPHAGOGASTRODUODENOSCOPY (EGD) WITH PROPOFOL N/A 02/02/2017   Dr. Gala Romney: esophageal stenosis s/p dilation and biopsy, medium sized hiatal hernia, normal duodenum  . ESOPHAGOGASTRODUODENOSCOPY (EGD) WITH PROPOFOL N/A 07/30/2017   Dr. Gala Romney: esophageal stenosis s/p dilation and biopsy, medium-sized hiatal hernia, normal duodenum  . HEMORRHOID SURGERY  01-21-2008    [facility]  . MALONEY DILATION N/A 02/02/2017   Procedure: Venia Minks DILATION;  Surgeon: Daneil Dolin, MD;  [facility];  Service: Endoscopy;   Laterality: N/A;  . Venia Minks DILATION N/A 07/30/2017   Procedure: Venia Minks DILATION;  Surgeon: Daneil Dolin, MD;  [facility];  Service: Endoscopy;  Laterality: N/A;  . POLYPECTOMY  05/03/2018   Procedure: POLYPECTOMY;  Surgeon: Daneil Dolin, MD;  [facility];  Service: Endoscopy;;  ascending colon polyp, sigmoid colon polyps (multiple)  . RADIOACTIVE SEED IMPLANT N/A 04/23/2017   Procedure: RADIOACTIVE SEED IMPLANT/BRACHYTHERAPY IMPLANT;  Surgeon: Franchot Gallo, MD;  [facility];  Service: Urology;  Laterality: N/A;  . SPACE OAR INSTILLATION N/A 04/23/2017   Procedure: SPACE OAR INSTILLATION;  Surgeon: Franchot Gallo, MD;  Location: [facility];  Service: Urology;  Laterality: N/A;  . TRANSTHORACIC ECHOCARDIOGRAM  03/28/2009   mild LVH, ef 55-60%/ mild aorta calcification  . VIDEO ASSISTED THORACOSCOPY (VATS)/DECORTICATION Left     Current Outpatient Medications  Medication Sig Dispense Refill  . albuterol (PROVENTIL HFA;VENTOLIN HFA) 108 (90 Base) MCG/ACT inhaler Inhale 2 puffs into the lungs every 4 (four) hours as needed for wheezing or shortness of breath. 1 Inhaler 5  . albuterol (PROVENTIL) (2.5 MG/3ML) 0.083% nebulizer solution Take 3 mLs (2.5 mg total) by nebulization every 6 (six) hours as needed for wheezing or shortness of breath. 300 mL 5  . Fluticasone-Umeclidin-Vilant (TRELEGY ELLIPTA) 100-62.5-25 MCG/INH AEPB Inhale 1 puff into the lungs daily. 60 each 6  . rivaroxaban (XARELTO) 20 MG TABS tablet Take 20 mg by mouth daily with supper.     No current facility-administered medications for this visit.     Allergies as of 08/19/2018  . (No Known Allergies)    Family History  Problem Relation Age of Onset  . Cancer Neg Hx   . Colon cancer Neg Hx   . Colon polyps Neg Hx   . Neuropathy Neg Hx     Social History   Socioeconomic History  . Marital status: Married    Spouse name: Not on file  .  Number of children: Not on file  . Years of education: Not on file  . Highest education level: Not on file  Occupational History  . Not on file  Social Needs  . Financial resource strain: Not on file  . Food insecurity:    Worry: Not on file    Inability: Not on file  . Transportation needs:    Medical: Not on file    Non-medical: Not on file  Tobacco Use  . Smoking status: Former Smoker    Packs/day: 1.00    Years: 30.00    Pack years: 30.00    Types: Cigarettes, E-cigarettes    Last attempt to quit: 10/03/2015    Years since quitting: 2.8  . Smokeless tobacco: Never Used  . Tobacco comment: stoped vaping 2018  Substance and Sexual Activity  . Alcohol use: Yes    Alcohol/week: 21.0 standard drinks    Types: 21 Shots of liquor per week  Comment: 2-3 drinks daily (Wine)   . Drug use: No  . Sexual activity: Yes    Birth control/protection: None  Lifestyle  . Physical activity:    Days per week: Not on file    Minutes per session: Not on file  . Stress: Not on file  Relationships  . Social connections:    Talks on phone: Not on file    Gets together: Not on file    Attends religious service: Not on file    Active member of club or organization: Not on file    Attends meetings of clubs or organizations: Not on file    Relationship status: Not on file  Other Topics Concern  . Not on file  Social History Narrative  . Not on file    Review of Systems: As mentioned in HPI   Physical Exam: BP 126/82   Pulse 97   Temp 98 F (36.7 C) (Oral)   Ht 6\' 1"  (1.854 m)   Wt 229 lb 12.8 oz (104.2 kg)   BMI 30.32 kg/m  General:   Alert and oriented. No distress noted. Pleasant and cooperative.  Head:  Normocephalic and atraumatic. Eyes:  Conjuctiva clear without scleral icterus. Mouth:  Oral mucosa pink and moist.  Abdomen:  +BS, soft, non-tender and non-distended. No rebound or guarding. No HSM or masses noted. Small [facility] Msk:  Symmetrical without gross deformities.  Normal posture. Extremities:  Without edema. Neurologic:  Alert and  oriented x4 Psych:  Alert and cooperative. Normal mood and affect.

## 2018-08-19 NOTE — Assessment & Plan Note (Addendum)
Currently asymptomatic from GERD standpoint. EGD on file from 2018 with esophageal stenosis s/p biopsy. Self-reported history of PUD many years ago and states imaging was done; I am unsure if any endoscopic procedures done at that time. He has stopped prescribed PPI therapy and desires to avoid PPI now. Discussed stated guidelines in detail; he will call if any recurrent GERD, dysphagia, dyspepsia. Return prn.

## 2018-08-19 NOTE — Patient Instructions (Signed)
Your next colonoscopy will be in 2022.  Call us if any abdominal pain, fever, chills, nausea, vomiting, changes in bowel habits, rectal bleeding. Call if any symptomatic reflux, problems swallowing.  We will see you as needed!  I enjoyed seeing you again today! As you know, I value our relationship and want to provide genuine, compassionate, and quality care. I welcome your feedback. If you receive a survey regarding your visit,  I greatly appreciate you taking time to fill this out. See you next time!  Annitta Needs, PhD, ANP-BC [facility]

## 2018-08-24 ENCOUNTER — Ambulatory Visit (INDEPENDENT_AMBULATORY_CARE_PROVIDER_SITE_OTHER): Payer: 59 | Admitting: Urology

## 2018-08-24 DIAGNOSIS — C61 Malignant neoplasm of prostate: Secondary | ICD-10-CM | POA: Diagnosis not present

## 2018-08-24 DIAGNOSIS — N401 Enlarged prostate with lower urinary tract symptoms: Secondary | ICD-10-CM | POA: Diagnosis not present

## 2018-08-24 DIAGNOSIS — N5201 Erectile dysfunction due to arterial insufficiency: Secondary | ICD-10-CM

## 2018-08-31 ENCOUNTER — Encounter: Payer: Self-pay | Admitting: Neurology

## 2018-09-01 NOTE — Progress Notes (Signed)
[name]  PATIENT: [name] DOB: Feb 13, 1956   REASON FOR VISIT: Obstructive sleep apnea here for  CPAP compliance HISTORY FROM: Patient    HISTORY OF PRESENT ILLNESS: 1/30/2020[name], 63 year old male returns for follow-up with history of obstructive sleep apnea here for CPAP compliance.  He has had no problems with his CPAP.  Data dated 08/02/2018-to 08/31/2018 shows compliance greater than 4 hours at 97%.  Average usage 5 hours 49 minutes.  Set pressure 5 to 18 cm.  EPR level 3 AHI 2.4 .  ESS 3 he returns for reevaluation   UPDATE 5/29/2019[name], 63 year old male returns for follow-up with history of obstructive sleep apnea here for CPAP compliance.  He denies any problems with his CPAP.  CPAP data dated 11/29/2017-12/28/2017 shows greater than 4 hours 87%.  Average usage 5 hours 47 minutes.  Set pressure 5 to 18 cm.  EPR level 3 AHI 3.4 leaks 95th percentile at 10.5.  He returns for reevaluation   UPDATE 2/26/2019[name], 63 year old male returns for follow-up with history of newly diagnosed obstructive sleep apnea.  He is here for his initial CPAP compliance.  He states he had a period of time to get adjusted to the CPAP but he feels he is adjusted now.  He complains of his mask leaking at times even though it is tight.  He does have a beard.  Compliance data dated 08/30/2017 to 25 2019 shows compliance greater than 4 hours at 83%.  Average usage 5 hours 48 minutes.  Set pressure 5-18 cm.  EPR level 3 AHI 4.4 95th percentile leak at 14.3.  He returns for reevaluation 06/02/17/[name] is a 63 y.o. male , seen here as in a referral   from [name] for sleep apnea. [name] medical history is positive for a lung abscess in the year 2008, a pneumothorax in 1983, bleeding hemorrhoids in 2010, surgical treatment. Shoulder surgery 2008 on  both sides- Not related to the rotator cuff. Prostate cancer with seed implants earlier this  year 2018. The procedure was just done on September 20. [name] has also seen my colleague [name].   [name] presents today mainly because of his loud snoring, which has been brought to his attention by his family and friends. He can also fall asleep anywhere anytime. He reported an anicteric of falling asleep by being out on a fishing boat in the [place]. He is fatigued.  He falls asleep watching TV and his family leaves the room- they can not hear the movies plot.    Sleep habits are as follows: Usually the patient spends the last hour before going to bed either watching TV or reading, and often falling asleep during it. He sleeps in a fetal position on his left side- on 2 pillows. The bedroom is cool, quiet and dark.  Sometimes he will stay put in his den for the whole night. He will usually transfer to the bedroom before midnight, and has no trouble falling asleep in his bed again. Since he had the prostate, the treatment he developed nocturia,goes now to the bathroom for about 3-4 times each night. This was not the case prior. Usually nocturia happens every 2 hours.Occasionally he will also wake himself choking or short of air, and with the feeling of an extremely dry mouth. His snoring has been described as thunderous, and his breathing is irregular. His wife has noted him always moving his legs  at night- a condition that was already known in childhood. Has not kept him from sleeping.  He typically rises at 4 or 5 AM in the morning, waking up spontaneously without alarm.  REVIEW OF SYSTEMS: Full 14 system review of systems performed and notable only for those listed, all others are neg:  Constitutional: neg  Cardiovascular: neg Ear/Nose/Throat: neg  Skin: neg Eyes: neg Respiratory: neg Gastroitestinal: neg  Hematology/Lymphatic: neg  Endocrine: neg Musculoskeletal: neg Allergy/Immunology: neg Neurological: neg Psychiatric: neg Sleep : Obstructive sleep apnea  with CPAP   ALLERGIES: No Known Allergies  HOME MEDICATIONS: Outpatient Medications Prior to Visit  Medication Sig Dispense Refill  . albuterol (PROVENTIL HFA;VENTOLIN HFA) 108 (90 Base) MCG/ACT inhaler Inhale 2 puffs into the lungs every 4 (four) hours as needed for wheezing or shortness of breath. 1 Inhaler 5  . albuterol (PROVENTIL) (2.5 MG/3ML) 0.083% nebulizer solution Take 3 mLs (2.5 mg total) by nebulization every 6 (six) hours as needed for wheezing or shortness of breath. 300 mL 5  . Fluticasone-Umeclidin-Vilant (TRELEGY ELLIPTA) 100-62.5-25 MCG/INH AEPB Inhale 1 puff into the lungs daily. 60 each 6  . rivaroxaban (XARELTO) 20 MG TABS tablet Take 20 mg by mouth daily with supper.     No facility-administered medications prior to visit.     PAST MEDICAL HISTORY: Past Medical History:  Diagnosis Date  . Bilateral shoulder bursitis   . COPD (chronic obstructive pulmonary disease) with emphysema ([place])   . DDD (degenerative disc disease), lumbar    hx epideral injection  L5--S1   . Dysphasia    intermittant w/ food   . Dyspnea   . Dyspnea on exertion   . GERD (gastroesophageal reflux disease)   . Hiatal hernia   . History of acute pancreatitis    alcoholic pancreatitis [id number] and 01-30-2012  . History of colon polyps    10-17-2005  hyperplastic polyp's  . History of esophageal stricture    s/p  dilation 02-02-2017  . History of peptic ulcer 1990s  . History of pneumococcal pneumonia 2006   bilateral pneumonia w/ left lower lobe abscess treated w/ chest tube with suction for drainage  . History of pneumothorax    1984--  left spontaneous pneumothorax ,  treated w/ chest tube  . Hypertension   . Malignant neoplasm of prostate [facility]) urologist-  dr dahlstedt/  oncologist -- dr Tammi Klippel   dx 12-02-2016-- Stage T2b, Gleason 3+4,  PSA 5.99,  vol 25cc  . Numbness of left foot    outer left ankle numb-- per pt has appt. w/ neurologist  . OSA (obstructive sleep apnea)     per pt has not used cpap over year ago from 04-16-2017--- per study 01-17-2010  severe osa  . Solid nodule of lung greater than 8 mm in diameter 05/11/2018   03/2018: RUL Nodule, 49mm  . Wears glasses     PAST SURGICAL HISTORY: Past Surgical History:  Procedure Laterality Date  . BIOPSY  02/02/2017   Procedure: BIOPSY;  Surgeon: Daneil Dolin, MD;  [facility];  Service: Endoscopy;;  duodenal bx's, esophgeal bx's  . BIOPSY  07/30/2017   Procedure: BIOPSY;  Surgeon: Daneil Dolin, MD;  [facility];  Service: Endoscopy;;  esophagus  . CARDIOVASCULAR STRESS TEST  09/12/2009   normal nuclear perfusion study w/ no ischemia /  normal LV function and wall motion , ef 57%  . COLONOSCOPY  2007   Dr. Gala Romney: hyperplastic polyps, internal hemorrhoids   .  COLONOSCOPY WITH PROPOFOL N/A 05/03/2018   Dr. Gala Romney: diverticulosis in entire colon, multiple polyps in sigmoid, at splenic flexure, and ascending colon, not all polyps removed. Tubular adenomas and inflammatory polyps. Needs 3 year surveilance  . ESOPHAGOGASTRODUODENOSCOPY (EGD) WITH PROPOFOL N/A 02/02/2017   Dr. Gala Romney: esophageal stenosis s/p dilation and biopsy, medium sized hiatal hernia, normal duodenum  . ESOPHAGOGASTRODUODENOSCOPY (EGD) WITH PROPOFOL N/A 07/30/2017   Dr. Gala Romney: esophageal stenosis s/p dilation and biopsy, medium-sized hiatal hernia, normal duodenum  . HEMORRHOID SURGERY  01-21-2008    [facility]  . MALONEY DILATION N/A 02/02/2017   Procedure: Venia Minks DILATION;  Surgeon: Daneil Dolin, MD;  [facility];  Service: Endoscopy;  Laterality: N/A;  . Venia Minks DILATION N/A 07/30/2017   Procedure: Venia Minks DILATION;  Surgeon: Daneil Dolin, MD;  [facility];  Service: Endoscopy;  Laterality: N/A;  . POLYPECTOMY  05/03/2018   Procedure: POLYPECTOMY;  Surgeon: Daneil Dolin, MD;  [facility];  Service: Endoscopy;;  ascending colon polyp, sigmoid colon polyps (multiple)  .  RADIOACTIVE SEED IMPLANT N/A 04/23/2017   Procedure: RADIOACTIVE SEED IMPLANT/BRACHYTHERAPY IMPLANT;  Surgeon: Franchot Gallo, MD;  Location: [facility];  Service: Urology;  Laterality: N/A;  . SPACE OAR INSTILLATION N/A 04/23/2017   Procedure: SPACE OAR INSTILLATION;  Surgeon: Franchot Gallo, MD;  Location: [facility];  Service: Urology;  Laterality: N/A;  . TRANSTHORACIC ECHOCARDIOGRAM  03/28/2009   mild LVH, ef 55-60%/ mild aorta calcification  . VIDEO ASSISTED THORACOSCOPY (VATS)/DECORTICATION Left     FAMILY HISTORY: Family History  Problem Relation Age of Onset  . Cancer Neg Hx   . Colon cancer Neg Hx   . Colon polyps Neg Hx   . Neuropathy Neg Hx     SOCIAL HISTORY: Social History   Socioeconomic History  . Marital status: Married    Spouse name: Not on file  . Number of children: Not on file  . Years of education: Not on file  . Highest education level: Not on file  Occupational History  . Not on file  Social Needs  . Financial resource strain: Not on file  . Food insecurity:    Worry: Not on file    Inability: Not on file  . Transportation needs:    Medical: Not on file    Non-medical: Not on file  Tobacco Use  . Smoking status: Former Smoker    Packs/day: 1.00    Years: 30.00    Pack years: 30.00    Types: Cigarettes, E-cigarettes    Last attempt to quit: 10/03/2015    Years since quitting: 2.9  . Smokeless tobacco: Never Used  . Tobacco comment: stoped vaping 2018  Substance and Sexual Activity  . Alcohol use: Yes    Alcohol/week: 21.0 standard drinks    Types: 21 Shots of liquor per week    Comment: 2-3 drinks daily (Wine)   . Drug use: No  . Sexual activity: Yes    Birth control/protection: None  Lifestyle  . Physical activity:    Days per week: Not on file    Minutes per session: Not on file  . Stress: Not on file  Relationships  . Social connections:    Talks on phone: Not on file    Gets together: Not on  file    Attends religious service: Not on file    Active member of club or organization: Not on file    Attends meetings of  clubs or organizations: Not on file    Relationship status: Not on file  . Intimate partner violence:    Fear of current or ex partner: Not on file    Emotionally abused: Not on file    Physically abused: Not on file    Forced sexual activity: Not on file  Other Topics Concern  . Not on file  Social History Narrative  . Not on file     PHYSICAL EXAM  Vitals:   09/02/18 1231  BP: 138/83  Pulse: 79  Weight: 231 lb (104.8 kg)  Height: 6\' 1"  (1.854 m)   Body mass index is 30.48 kg/m.  Generalized: Well developed, in no acute distress  Head: normocephalic and atraumatic,. Oropharynx benign malopatti 5 Neck: Supple, 18.5 Lungs clear Musculoskeletal: No deformity  Skin no rash or edema Neurological examination   Mentation: Alert oriented to time, place, history taking. Attention span and concentration appropriate. Recent and remote memory intact.  Follows all commands speech and language fluent.   Cranial nerve II-XII: .Pupils were equal round reactive to light extraocular movements were full, visual field were full on confrontational test. Facial sensation and strength were normal. hearing was intact to finger rubbing bilaterally. Uvula tongue midline. head turning and shoulder shrug were normal and symmetric.Tongue protrusion into cheek strength was normal. Motor: normal bulk and tone, full strength in the BUE, BLE,  Sensory: normal and symmetric to light touch,  Coordination: finger-nose-finger, heel-to-shin bilaterally, no dysmetria Gait and Station: Rising up from seated position without assistance, normal stance,  moderate stride, good arm swing, smooth turning, able to perform tiptoe, and heel walking without difficulty. Tandem gait is steady  DIAGNOSTIC DATA (LABS, IMAGING, TESTING) - I reviewed patient records, labs, notes, testing and imaging  myself where available.  Lab Results  Component Value Date   WBC 7.5 04/23/2018   HGB 14.3 04/23/2018   HCT 42.9 04/23/2018   MCV 100.7 (H) 04/23/2018   PLT 146 (L) 04/23/2018      Component Value Date/Time   NA 141 04/23/2018 1325   K 4.1 04/23/2018 1325   CL 110 04/23/2018 1325   CO2 25 04/23/2018 1325   GLUCOSE 127 (H) 04/23/2018 1325   BUN 15 04/23/2018 1325   CREATININE 1.39 (H) 04/23/2018 1325   CALCIUM 8.8 (L) 04/23/2018 1325   PROT 7.4 06/23/2017 0045   ALBUMIN 3.9 06/23/2017 0045   AST 31 06/23/2017 0045   ALT 31 06/23/2017 0045   ALKPHOS 52 06/23/2017 0045   BILITOT 0.8 06/23/2017 0045   GFRNONAA 53 (L) 04/23/2018 1325   GFRAA >60 04/23/2018 1325   Lab Results  Component Value Date   CHOL 180 11/13/2011   HDL 62 11/13/2011   LDLCALC 99 11/13/2011   TRIG 95 11/13/2011   CHOLHDL 2.9 11/13/2011    Lab Results  Component Value Date   TSH 1.34 09/12/2009      ASSESSMENT AND PLAN  63 y.o. year old male here to follow-up for  OSA (obstructive sleep apnea), with  CPAP compliance.Data dated 08/02/2018-to 08/31/2018 shows compliance greater than 4 hours at 97%.  Average usage 5 hours 49 minutes.  Set pressure 5 to 18 cm.  EPR level 3 AHI 2.4 .  ESS 3   PLAN: CPAP compliance 97% Continue same settings F/U yearly Dennie Bible, [facility], [facility], APRN  [name] [address] [place], [place] [address] [phone]

## 2018-09-02 ENCOUNTER — Ambulatory Visit (INDEPENDENT_AMBULATORY_CARE_PROVIDER_SITE_OTHER): Payer: 59 | Admitting: Nurse Practitioner

## 2018-09-02 ENCOUNTER — Encounter: Payer: Self-pay | Admitting: Nurse Practitioner

## 2018-09-02 VITALS — BP 138/83 | HR 79 | Ht 73.0 in | Wt 231.0 lb

## 2018-09-02 DIAGNOSIS — G4733 Obstructive sleep apnea (adult) (pediatric): Secondary | ICD-10-CM

## 2018-09-02 DIAGNOSIS — Z9989 Dependence on other enabling machines and devices: Secondary | ICD-10-CM

## 2018-09-02 NOTE — Patient Instructions (Signed)
CPAP compliance 97% Continue same settings F/U yearly

## 2018-09-03 ENCOUNTER — Encounter: Payer: Self-pay | Admitting: *Deleted

## 2018-09-03 ENCOUNTER — Encounter: Payer: Self-pay | Admitting: Cardiology

## 2018-09-03 ENCOUNTER — Ambulatory Visit (INDEPENDENT_AMBULATORY_CARE_PROVIDER_SITE_OTHER): Payer: 59 | Admitting: Cardiology

## 2018-09-03 VITALS — BP 126/78 | HR 72 | Ht 73.0 in | Wt 233.0 lb

## 2018-09-03 DIAGNOSIS — E782 Mixed hyperlipidemia: Secondary | ICD-10-CM

## 2018-09-03 DIAGNOSIS — I2510 Atherosclerotic heart disease of native coronary artery without angina pectoris: Secondary | ICD-10-CM

## 2018-09-03 DIAGNOSIS — R079 Chest pain, unspecified: Secondary | ICD-10-CM | POA: Diagnosis not present

## 2018-09-03 NOTE — Progress Notes (Signed)
Clinical Summary [name] is a 63 y.o.male seen as new consult, referred by Dr   1. CAD - noted by recent CT scan by pcp, showing left main and 3 vessel CAD. - occasional chest pain. Sharp/pressure entire chest, 5/10 in severity. Can occur at rest or with exertion. No other associated symptoms. Lasts a few minutes, fairly rare. Not positional. No relation to food - about 1 month ago swimming x 30 minutes, no recent symptoms.   - not ASA because on xarelto.   CAD risk factors: former tobacco x 30 years. Borderline HL, father in his 54s CABG.   2. COPD - followed by pul   3. Lung nodule - followed by pul   4. OSA - on CPAP   5. DVT - followed by heme/onc, on xarelto   6. Chronic venous insufficiency - followed by vascular  7. Hyperlipidemia - 03/2018 TC 226 HDL 52 TG 281 LDL 131 - has not started statin, working on risk lifestyle modification.   Past Medical History:  Diagnosis Date  . Bilateral shoulder bursitis   . COPD (chronic obstructive pulmonary disease) with emphysema ([place])   . DDD (degenerative disc disease), lumbar    hx epideral injection  L5--S1   . Dysphasia    intermittant w/ food   . Dyspnea   . Dyspnea on exertion   . GERD (gastroesophageal reflux disease)   . Hiatal hernia   . History of acute pancreatitis    alcoholic pancreatitis [id number] and 01-30-2012  . History of colon polyps    10-17-2005  hyperplastic polyp's  . History of esophageal stricture    s/p  dilation 02-02-2017  . History of peptic ulcer 1990s  . History of pneumococcal pneumonia 2006   bilateral pneumonia w/ left lower lobe abscess treated w/ chest tube with suction for drainage  . History of pneumothorax    1984--  left spontaneous pneumothorax ,  treated w/ chest tube  . Hypertension   . Malignant neoplasm of prostate [facility]) urologist-  dr dahlstedt/  oncologist -- dr Tammi Klippel   dx 12-02-2016-- Stage T2b, Gleason 3+4,  PSA 5.99,  vol 25cc  . Numbness of left  foot    outer left ankle numb-- per pt has appt. w/ neurologist  . OSA (obstructive sleep apnea)    per pt has not used cpap over year ago from 04-16-2017--- per study 01-17-2010  severe osa  . Solid nodule of lung greater than 8 mm in diameter 05/11/2018   03/2018: RUL Nodule, 84mm  . Wears glasses      No Known Allergies   Current Outpatient Medications  Medication Sig Dispense Refill  . albuterol (PROVENTIL HFA;VENTOLIN HFA) 108 (90 Base) MCG/ACT inhaler Inhale 2 puffs into the lungs every 4 (four) hours as needed for wheezing or shortness of breath. 1 Inhaler 5  . albuterol (PROVENTIL) (2.5 MG/3ML) 0.083% nebulizer solution Take 3 mLs (2.5 mg total) by nebulization every 6 (six) hours as needed for wheezing or shortness of breath. 300 mL 5  . Fluticasone-Umeclidin-Vilant (TRELEGY ELLIPTA) 100-62.5-25 MCG/INH AEPB Inhale 1 puff into the lungs daily. 60 each 6  . rivaroxaban (XARELTO) 20 MG TABS tablet Take 20 mg by mouth daily with supper.     No current facility-administered medications for this visit.      Past Surgical History:  Procedure Laterality Date  . BIOPSY  02/02/2017   Procedure: BIOPSY;  Surgeon: Daneil Dolin, MD;  [facility];  Service: Endoscopy;;  duodenal bx's, esophgeal bx's  . BIOPSY  07/30/2017   Procedure: BIOPSY;  Surgeon: Daneil Dolin, MD;  [facility];  Service: Endoscopy;;  esophagus  . CARDIOVASCULAR STRESS TEST  09/12/2009   normal nuclear perfusion study w/ no ischemia /  normal LV function and wall motion , ef 57%  . COLONOSCOPY  2007   Dr. Gala Romney: hyperplastic polyps, internal hemorrhoids   . COLONOSCOPY WITH PROPOFOL N/A 05/03/2018   Dr. Gala Romney: diverticulosis in entire colon, multiple polyps in sigmoid, at splenic flexure, and ascending colon, not all polyps removed. Tubular adenomas and inflammatory polyps. Needs 3 year surveilance  . ESOPHAGOGASTRODUODENOSCOPY (EGD) WITH PROPOFOL N/A 02/02/2017   Dr. Gala Romney: esophageal  stenosis s/p dilation and biopsy, medium sized hiatal hernia, normal duodenum  . ESOPHAGOGASTRODUODENOSCOPY (EGD) WITH PROPOFOL N/A 07/30/2017   Dr. Gala Romney: esophageal stenosis s/p dilation and biopsy, medium-sized hiatal hernia, normal duodenum  . HEMORRHOID SURGERY  01-21-2008    [facility]  . MALONEY DILATION N/A 02/02/2017   Procedure: Venia Minks DILATION;  Surgeon: Daneil Dolin, MD;  [facility];  Service: Endoscopy;  Laterality: N/A;  . Venia Minks DILATION N/A 07/30/2017   Procedure: Venia Minks DILATION;  Surgeon: Daneil Dolin, MD;  [facility];  Service: Endoscopy;  Laterality: N/A;  . POLYPECTOMY  05/03/2018   Procedure: POLYPECTOMY;  Surgeon: Daneil Dolin, MD;  [facility];  Service: Endoscopy;;  ascending colon polyp, sigmoid colon polyps (multiple)  . RADIOACTIVE SEED IMPLANT N/A 04/23/2017   Procedure: RADIOACTIVE SEED IMPLANT/BRACHYTHERAPY IMPLANT;  Surgeon: Franchot Gallo, MD;  Location: [facility];  Service: Urology;  Laterality: N/A;  . SPACE OAR INSTILLATION N/A 04/23/2017   Procedure: SPACE OAR INSTILLATION;  Surgeon: Franchot Gallo, MD;  Location[facility];  Service: Urology;  Laterality: N/A;  . TRANSTHORACIC ECHOCARDIOGRAM  03/28/2009   mild LVH, ef 55-60%/ mild aorta calcification  . VIDEO ASSISTED THORACOSCOPY (VATS)/DECORTICATION Left      No Known Allergies    Family History  Problem Relation Age of Onset  . Cancer Neg Hx   . Colon cancer Neg Hx   . Colon polyps Neg Hx   . Neuropathy Neg Hx      Social History [name] reports that he quit smoking about 2 years ago. His smoking use included cigarettes and e-cigarettes. He has a 30.00 pack-year smoking history. He has never used smokeless tobacco. [name] reports current alcohol use of about 21.0 standard drinks of alcohol per week.   Review of Systems CONSTITUTIONAL: No weight loss, fever, chills, weakness or fatigue.  HEENT:  Eyes: No visual loss, blurred vision, double vision or yellow sclerae.No hearing loss, sneezing, congestion, runny nose or sore throat.  SKIN: No rash or itching.  CARDIOVASCULAR: per hpi RESPIRATORY: No shortness of breath, cough or sputum.  GASTROINTESTINAL: No anorexia, nausea, vomiting or diarrhea. No abdominal pain or blood.  GENITOURINARY: No burning on urination, no polyuria NEUROLOGICAL: No headache, dizziness, syncope, paralysis, ataxia, numbness or tingling in the extremities. No change in bowel or bladder control.  MUSCULOSKELETAL: No muscle, back pain, joint pain or stiffness.  LYMPHATICS: No enlarged nodes. No history of splenectomy.  PSYCHIATRIC: No history of depression or anxiety.  ENDOCRINOLOGIC: No reports of sweating, cold or heat intolerance. No polyuria or polydipsia.  Marland Kitchen   Physical Examination Vitals:   09/03/18 1014 09/03/18 1016  BP: 130/82 126/78  Pulse: 72   SpO2: 96%  Vitals:   09/03/18 1014  Weight: 233 lb (105.7 kg)  Height: 6\' 1"  (1.854 m)    Gen: resting comfortably, no acute distress HEENT: no scleral icterus, pupils equal round and reactive, no palptable cervical adenopathy,  CV: RRR, no m/r/g, no jvd Resp: Clear to auscultation bilaterally GI: abdomen is soft, non-tender, non-distended, normal bowel sounds, no hepatosplenomegaly MSK: extremities are warm, no edema.  Skin: warm, no rash Neuro:  no focal deficits Psych: appropriate affect   Diagnostic Studies 07/2018 CT chest IMPRESSION: 1. Lung-RADS 2S, benign appearance or behavior. Continue annual screening with low-dose chest CT without contrast in 12 months. 2. The "S" modifier above refers to potentially clinically significant non lung cancer related findings. Specifically, there is aortic atherosclerosis, in addition to left main and 3 vessel coronary artery disease. Please note that although the presence of coronary artery calcium documents the presence of coronary  artery disease, the severity of this disease and any potential stenosis cannot be assessed on this non-gated CT examination. Assessment for potential risk factor modification, dietary therapy or pharmacologic therapy may be warranted, if clinically indicated. 3. Mild diffuse bronchial wall thickening with mild centrilobular and paraseptal emphysema; imaging findings suggestive of underlying COPD. 4. Hepatic steatosis.     Assessment and Plan  1. CAD - incidental finding from recent chest CT, evidence of LM and 3 vessel disease - nonpsecific chest pain symptmos - we will plan for an exercise nuclear stress test to evaluate the functionality of these lesions - no ASA since on xarelto, recommended starting statin. If comes off xarelto at some point would start ASA  2. Hyperlipidemia - ASCVD risk 9.8% over 10 years, intermediate risk. In this setting based on 2018 lipid guidelines moderate strength statin is recmomend - I have recommended lipitor 40mg  daily, he would like to think about before starting   F/u pending stress results.       Arnoldo Lenis, M.D.

## 2018-09-03 NOTE — Patient Instructions (Signed)
Medication Instructions:  Your physician recommends that you continue on your current medications as directed. Please refer to the Current Medication list given to you today.  If you need a refill on your cardiac medications before your next appointment, please call your pharmacy.   Lab work: NONE  If you have labs (blood work) drawn today and your tests are completely normal, you will receive your results only by: Marland Kitchen MyChart Message (if you have MyChart) OR . A paper copy in the mail If you have any lab test that is abnormal or we need to change your treatment, we will call you to review the results.  Testing/Procedures: Your physician has requested that you have en exercise stress myoview. For further information please visit [email or web address]. Please follow instruction sheet, as given.    Follow-Up: At [facility], you and your health needs are our priority.  As part of our continuing mission to provide you with exceptional heart care, we have created designated Provider Care Teams.  These Care Teams include your primary Cardiologist (physician) and Advanced Practice Providers (APPs -  Physician Assistants and Nurse Practitioners) who all work together to provide you with the care you need, when you need it. You will need a follow up appointment depending on test results.  Please call our office 2 months in advance to schedule this appointment.  You may see Carlyle Dolly, MD or one of the following Advanced Practice Providers on your designated Care Team:   Bernerd Pho, PA-C [facility]) . Ermalinda Barrios, PA-C ([place])  Any Other Special Instructions Will Be Listed Below (If Applicable). Thank you for choosing [facility]!

## 2018-09-09 ENCOUNTER — Ambulatory Visit: Payer: 59 | Admitting: Cardiovascular Disease

## 2018-09-14 ENCOUNTER — Encounter (HOSPITAL_COMMUNITY): Payer: Self-pay

## 2018-09-14 ENCOUNTER — Ambulatory Visit (HOSPITAL_COMMUNITY)
Admission: RE | Admit: 2018-09-14 | Discharge: 2018-09-14 | Disposition: A | Discharge status: Home / Self Care | Payer: 59 | Source: Ambulatory Visit | Attending: Cardiovascular Disease | Admitting: Cardiovascular Disease

## 2018-09-14 ENCOUNTER — Encounter (HOSPITAL_COMMUNITY)
Admission: RE | Admit: 2018-09-14 | Discharge: 2018-09-14 | Disposition: A | Discharge status: Home / Self Care | Payer: 59 | Source: Ambulatory Visit | Attending: Cardiovascular Disease | Admitting: Cardiovascular Disease

## 2018-09-14 DIAGNOSIS — R079 Chest pain, unspecified: Secondary | ICD-10-CM | POA: Insufficient documentation

## 2018-09-14 LAB — NM MYOCAR MULTI W/SPECT W/WALL MOTION / EF
CHL CUP MPHR: 159 {beats}/min
CHL CUP NUCLEAR SDS: 1
CHL CUP NUCLEAR SRS: 6
CHL CUP RESTING HR STRESS: 81 {beats}/min
CHL RATE OF PERCEIVED EXERTION: 15
CSEPEW: 10.1 METS
CSEPHR: 90 %
CSEPPHR: 144 {beats}/min
Exercise duration (min): 7 min
Exercise duration (sec): 31 s
LHR: 0.4
LV dias vol: 77 mL (ref 62–150)
LVSYSVOL: 23 mL
SSS: 5
TID: 1.07

## 2018-09-14 MED ORDER — SODIUM CHLORIDE 0.9% FLUSH
INTRAVENOUS | Status: AC
  Administered 2018-09-14: 10 mL via INTRAVENOUS
  Filled 2018-09-14: qty 10

## 2018-09-14 MED ORDER — TECHNETIUM TC 99M TETROFOSMIN IV KIT
30.0000 | PACK | Freq: Once | INTRAVENOUS | Status: AC | PRN
  Administered 2018-09-14: 32 via INTRAVENOUS

## 2018-09-14 MED ORDER — REGADENOSON 0.4 MG/5ML IV SOLN
INTRAVENOUS | Status: AC
  Filled 2018-09-14: qty 5

## 2018-09-14 MED ORDER — TECHNETIUM TC 99M TETROFOSMIN IV KIT
10.0000 | PACK | Freq: Once | INTRAVENOUS | Status: AC | PRN
  Administered 2018-09-14: 11 via INTRAVENOUS

## 2018-09-25 ENCOUNTER — Other Ambulatory Visit: Payer: Self-pay

## 2018-09-25 ENCOUNTER — Encounter (HOSPITAL_COMMUNITY): Payer: Self-pay | Admitting: Emergency Medicine

## 2018-09-25 ENCOUNTER — Emergency Department (HOSPITAL_COMMUNITY)
Admission: EM | Admit: 2018-09-25 | Discharge: 2018-09-26 | Disposition: A | Discharge status: Home / Self Care | Payer: 59 | Attending: Emergency Medicine | Admitting: Emergency Medicine

## 2018-09-25 DIAGNOSIS — Z79899 Other long term (current) drug therapy: Secondary | ICD-10-CM | POA: Diagnosis not present

## 2018-09-25 DIAGNOSIS — J449 Chronic obstructive pulmonary disease, unspecified: Secondary | ICD-10-CM | POA: Insufficient documentation

## 2018-09-25 DIAGNOSIS — I10 Essential (primary) hypertension: Secondary | ICD-10-CM | POA: Diagnosis not present

## 2018-09-25 DIAGNOSIS — Z87891 Personal history of nicotine dependence: Secondary | ICD-10-CM | POA: Diagnosis not present

## 2018-09-25 DIAGNOSIS — R197 Diarrhea, unspecified: Secondary | ICD-10-CM | POA: Insufficient documentation

## 2018-09-25 DIAGNOSIS — R112 Nausea with vomiting, unspecified: Secondary | ICD-10-CM | POA: Diagnosis not present

## 2018-09-25 LAB — COMPREHENSIVE METABOLIC PANEL
ALK PHOS: 53 U/L (ref 38–126)
ALT: 39 U/L (ref 0–44)
ANION GAP: 13 (ref 5–15)
AST: 42 U/L — ABNORMAL HIGH (ref 15–41)
Albumin: 4.1 g/dL (ref 3.5–5.0)
BUN: 16 mg/dL (ref 8–23)
CALCIUM: 8.9 mg/dL (ref 8.9–10.3)
CO2: 23 mmol/L (ref 22–32)
Chloride: 106 mmol/L (ref 98–111)
Creatinine, Ser: 1.01 mg/dL (ref 0.61–1.24)
GFR calc non Af Amer: >60 mL/min (ref >60)
Glucose, Bld: 178 mg/dL — ABNORMAL HIGH (ref 70–99)
Potassium: 4 mmol/L (ref 3.5–5.1)
Sodium: 142 mmol/L (ref 135–145)
Total Bilirubin: 1 mg/dL (ref 0.3–1.2)
Total Protein: 7.6 g/dL (ref 6.5–8.1)

## 2018-09-25 LAB — CBC
HCT: 49.8 % (ref 39.0–52.0)
Hemoglobin: 16.4 g/dL (ref 13.0–17.0)
MCH: 32.7 pg (ref 26.0–34.0)
MCHC: 32.9 g/dL (ref 30.0–36.0)
MCV: 99.2 fL (ref 80.0–100.0)
PLATELETS: 158 10*3/uL (ref 150–400)
RBC: 5.02 MIL/uL (ref 4.22–5.81)
RDW: 14.5 % (ref 11.5–15.5)
WBC: 4.6 10*3/uL (ref 4.0–10.5)
nRBC: 0 % (ref 0.0–0.2)

## 2018-09-25 LAB — LIPASE, BLOOD: Lipase: 20 U/L (ref 11–51)

## 2018-09-25 MED ORDER — ONDANSETRON 4 MG PO TBDP
4.0000 mg | ORAL_TABLET | Freq: Once | ORAL | Status: AC | PRN
  Administered 2018-09-25: 4 mg via ORAL
  Filled 2018-09-25: qty 1

## 2018-09-25 MED ORDER — ONDANSETRON HCL 4 MG/2ML IJ SOLN: 4.0000 mg | Freq: Once | INTRAMUSCULAR | Status: DC

## 2018-09-25 MED ORDER — SODIUM CHLORIDE 0.9 % IV BOLUS
1000.0000 mL | Freq: Once | INTRAVENOUS | Status: AC
  Administered 2018-09-25: 1000 mL via INTRAVENOUS

## 2018-09-25 NOTE — ED Notes (Signed)
Pt with N/V since 1630  No flu shot this year  Hx pancreatitis but states it does not feel like that  Continues to imbibe Alert, loud, exaggerated exhalations   IV, spec to lab

## 2018-09-25 NOTE — ED Triage Notes (Signed)
Pt states he began having N/V/D/ at 1630 today. No OTC medication. Approx 11 episodes of V/ and 6 episodes of D/. Denies known sick contacts.

## 2018-09-26 MED ORDER — METOCLOPRAMIDE HCL 10 MG PO TABS: 10.0000 mg | ORAL_TABLET | Freq: Four times a day (QID) | ORAL | 0 refills | Status: DC | PRN

## 2018-09-26 MED ORDER — SODIUM CHLORIDE 0.9 % IV BOLUS
1000.0000 mL | Freq: Once | INTRAVENOUS | Status: AC
  Administered 2018-09-26: 1000 mL via INTRAVENOUS

## 2018-09-26 MED ORDER — METOCLOPRAMIDE HCL 5 MG/ML IJ SOLN
10.0000 mg | Freq: Once | INTRAMUSCULAR | Status: AC
  Administered 2018-09-26: 10 mg via INTRAVENOUS
  Filled 2018-09-26: qty 2

## 2018-09-26 NOTE — ED Provider Notes (Signed)
[facility] EMERGENCY DEPARTMENT Provider Note   CSN: [id number] Arrival date & time: 09/25/18  2211    History   Chief Complaint Chief Complaint  Patient presents with  . Emesis    HPI [name] is a 63 y.o. male.  The history is provided by the patient.  He has history of pancreatitis, esophageal stricture, hypertension, prostate cancer and comes in complaining of vomiting and diarrhea which started at 4:30 PM.  He estimates a dozen episodes of emesis and approximately 6 episodes of diarrhea.  Since coming to the ED, he has been given IV fluid and IV ondansetron without any improvement in nausea.  He no longer feels like he is going to have more diarrhea.  He states his abdomen feels tense but he denies any abdominal pain.  He denies fever, chills, sweats.  There have been no known sick contacts and no unusual food exposure.  Past Medical History:  Diagnosis Date  . Bilateral shoulder bursitis   . COPD (chronic obstructive pulmonary disease) with emphysema ([place])   . DDD (degenerative disc disease), lumbar    hx epideral injection  L5--S1   . Dysphasia    intermittant w/ food   . Dyspnea   . Dyspnea on exertion   . GERD (gastroesophageal reflux disease)   . Hiatal hernia   . History of acute pancreatitis    alcoholic pancreatitis [id number] and 01-30-2012  . History of colon polyps    10-17-2005  hyperplastic polyp's  . History of esophageal stricture    s/p  dilation 02-02-2017  . History of peptic ulcer 1990s  . History of pneumococcal pneumonia 2006   bilateral pneumonia w/ left lower lobe abscess treated w/ chest tube with suction for drainage  . History of pneumothorax    1984--  left spontaneous pneumothorax ,  treated w/ chest tube  . Hypertension   . Malignant neoplasm of prostate [facility]) urologist-  dr dahlstedt/  oncologist -- dr Tammi Klippel   dx 12-02-2016-- Stage T2b, Gleason 3+4,  PSA 5.99,  vol 25cc  . Numbness of left foot    outer left ankle numb--  per pt has appt. w/ neurologist  . OSA (obstructive sleep apnea)    per pt has not used cpap over year ago from 04-16-2017--- per study 01-17-2010  severe osa  . Solid nodule of lung greater than 8 mm in diameter 05/11/2018   03/2018: RUL Nodule, 43mm  . Wears glasses     Patient Active Problem List   Diagnosis Date Noted  . History of colonic polyps 08/19/2018  . Esophageal stenosis 08/19/2018  . DVT (deep venous thrombosis) ([place]) 05/11/2018  . Solid nodule of lung greater than 8 mm in diameter 05/11/2018  . Abnormal CT scan, colon 04/13/2018  . Obstructive sleep apnea treated with continuous positive airway pressure (CPAP) 09/29/2017  . GERD (gastroesophageal reflux disease) 04/07/2017  . Dysphagia 01/29/2017  . Malignant neoplasm of prostate ([place]) 01/19/2017  . Pancreatitis, alcoholic 36/64/4034  . Alcoholism ([place]) 11/11/2011  . Tobacco abuse 11/11/2011  . Thrombocytopenia ([place]) 11/11/2011  . HYPERSOMNIA, ASSOCIATED WITH SLEEP APNEA 09/27/2009  . COPD 09/07/2009  . Pneumothorax 09/07/2009  . BURSITIS 09/07/2009  . FATIGUE / MALAISE 09/07/2009  . SHORTNESS OF BREATH 09/07/2009  . PNEUMOTHORAX 09/07/2009    Past Surgical History:  Procedure Laterality Date  . BIOPSY  02/02/2017   Procedure: BIOPSY;  Surgeon: Daneil Dolin, MD;  Location: [facility] END[facility];  Service: Endoscopy;;  duodenal bx's, esophgeal  bx's  . BIOPSY  07/30/2017   Procedure: BIOPSY;  Surgeon: Daneil Dolin, MD;  Location: [facility];  Service: Endoscopy;;  esophagus  . CARDIOVASCULAR STRESS TEST  09/12/2009   normal nuclear perfusion study w/ no ischemia /  normal LV function and wall motion , ef 57%  . COLONOSCOPY  2007   Dr. Gala Romney: hyperplastic polyps, internal hemorrhoids   . COLONOSCOPY WITH PROPOFOL N/A 05/03/2018   Dr. Gala Romney: diverticulosis in entire colon, multiple polyps in sigmoid, at splenic flexure, and ascending colon, not all polyps removed. Tubular adenomas and inflammatory polyps. Needs 3  year surveilance  . ESOPHAGOGASTRODUODENOSCOPY (EGD) WITH PROPOFOL N/A 02/02/2017   Dr. Gala Romney: esophageal stenosis s/p dilation and biopsy, medium sized hiatal hernia, normal duodenum  . ESOPHAGOGASTRODUODENOSCOPY (EGD) WITH PROPOFOL N/A 07/30/2017   Dr. Gala Romney: esophageal stenosis s/p dilation and biopsy, medium-sized hiatal hernia, normal duodenum  . HEMORRHOID SURGERY  01-21-2008    [facility]  . MALONEY DILATION N/A 02/02/2017   Procedure: Venia Minks DILATION;  Surgeon: Daneil Dolin, MD;  Location[facility];  Service: Endoscopy;  Laterality: N/A;  . Venia Minks DILATION N/A 07/30/2017   Procedure: Venia Minks DILATION;  Surgeon: Daneil Dolin, MD;  Location[facility];  Service: Endoscopy;  Laterality: N/A;  . POLYPECTOMY  05/03/2018   Procedure: POLYPECTOMY;  Surgeon: Daneil Dolin, MD;  Location[facility];  Service: Endoscopy;;  ascending colon polyp, sigmoid colon polyps (multiple)  . RADIOACTIVE SEED IMPLANT N/A 04/23/2017   Procedure: RADIOACTIVE SEED IMPLANT/BRACHYTHERAPY IMPLANT;  Surgeon: Franchot Gallo, MD;  Location: [facility];  Service: Urology;  Laterality: N/A;  . SPACE OAR INSTILLATION N/A 04/23/2017   Procedure: SPACE OAR INSTILLATION;  Surgeon: Franchot Gallo, MD;  Location: [facility];  Service: Urology;  Laterality: N/A;  . TRANSTHORACIC ECHOCARDIOGRAM  03/28/2009   mild LVH, ef 55-60%/ mild aorta calcification  . VIDEO ASSISTED THORACOSCOPY (VATS)/DECORTICATION Left         Home Medications    Prior to Admission medications   Medication Sig Start Date End Date Taking? Authorizing Provider  albuterol (PROVENTIL HFA;VENTOLIN HFA) 108 (90 Base) MCG/ACT inhaler Inhale 2 puffs into the lungs every 4 (four) hours as needed for wheezing or shortness of breath. 03/22/18   Icard, Leory Plowman L, DO  albuterol (PROVENTIL) (2.5 MG/3ML) 0.083% nebulizer solution Take 3 mLs (2.5 mg total) by nebulization every 6 (six) hours as needed for  wheezing or shortness of breath. 03/22/18   Icard, Octavio Graves, DO  Fluticasone-Umeclidin-Vilant (TRELEGY ELLIPTA) 100-62.5-25 MCG/INH AEPB Inhale 1 puff into the lungs daily. 07/07/18   Icard, Octavio Graves, DO  rivaroxaban (XARELTO) 20 MG TABS tablet Take 20 mg by mouth daily with supper.    [provider]    Family History Family History  Problem Relation Age of Onset  . Cancer Neg Hx   . Colon cancer Neg Hx   . Colon polyps Neg Hx   . Neuropathy Neg Hx     Social History Social History   Tobacco Use  . Smoking status: Former Smoker    Packs/day: 1.00    Years: 30.00    Pack years: 30.00    Types: Cigarettes, E-cigarettes    Last attempt to quit: 10/03/2015    Years since quitting: 2.9  . Smokeless tobacco: Never Used  . Tobacco comment: stoped vaping 2018  Substance Use Topics  . Alcohol use: Yes    Alcohol/week: 21.0 standard drinks  Types: 21 Shots of liquor per week    Comment: 2-3 drinks daily (Wine)   . Drug use: No     Allergies   Patient has no known allergies.   Review of Systems Review of Systems  All other systems reviewed and are negative.    Physical Exam Updated Vital Signs BP (!) 160/94   Pulse (!) 111   Temp 98.4 F (36.9 C) (Temporal)   Resp 18   Ht 6\' 1"  (1.854 m)   Wt 104.3 kg   SpO2 90%   BMI 30.34 kg/m   Physical Exam Vitals signs and nursing note reviewed.    63 year old male, resting comfortably and in no acute distress. Vital signs are significant for elevated heart rate and blood pressure. Oxygen saturation is 90%, which is borderline low. Head is normocephalic and atraumatic. PERRLA, EOMI. Oropharynx is clear. Neck is nontender and supple without adenopathy or JVD. Back is nontender and there is no CVA tenderness. Lungs are clear without rales, wheezes, or rhonchi. Chest is nontender. Heart has regular rate and rhythm without murmur. Abdomen is soft, flat, nontender without masses or hepatosplenomegaly and  peristalsis is hypoactive. Extremities have no cyanosis or edema, full range of motion is present. Skin is warm and dry without rash. Neurologic: Mental status is normal, cranial nerves are intact, there are no motor or sensory deficits.  ED Treatments / Results  Labs (all labs ordered are listed, but only abnormal results are displayed) Labs Reviewed  COMPREHENSIVE METABOLIC PANEL - Abnormal; Notable for the following components:      Result Value   Glucose, Bld 178 (*)    AST 42 (*)    All other components within normal limits  LIPASE, BLOOD  CBC  URINALYSIS, ROUTINE W REFLEX MICROSCOPIC    Procedures Procedures  Medications Ordered in ED Medications  ondansetron (ZOFRAN) injection 4 mg (0 mg Intravenous Hold 09/25/18 2244)  sodium chloride 0.9 % bolus 1,000 mL (1,000 mLs Intravenous New Bag/Given 09/25/18 2328)  ondansetron (ZOFRAN-ODT) disintegrating tablet 4 mg (4 mg Oral Given 09/25/18 2230)  sodium chloride 0.9 % bolus 1,000 mL (0 mLs Intravenous Stopped 09/25/18 2328)     Initial Impression / Assessment and Plan / ED Course  I have reviewed the triage vital signs and the nursing notes.  Pertinent labs & imaging results that were available during my care of the patient were reviewed by me and considered in my medical decision making (see chart for details).  Nausea, vomiting, diarrhea and pattern strongly suggestive of viral gastroenteritis.  No red flags to suggest more serious pathology.  Screening labs are significant for borderline elevated AST and mildly elevated glucose.  Elevated glucose is felt to be related to physiologic stress.  Old records are reviewed confirming 2 hospitalizations for pancreatitis.  He will be given additional IV fluids as well as IV metoclopramide and reassessed.  He feels much better following above-noted treatment.  He is discharged with a prescription for metoclopramide.  Final Clinical Impressions(s) / ED Diagnoses   Final diagnoses:    Nausea vomiting and diarrhea    ED Discharge Orders         Ordered    metoCLOPramide (REGLAN) 10 MG tablet  Every 6 hours PRN     09/26/18 2831           Delora Fuel, MD 51/76/16 0157

## 2018-09-26 NOTE — ED Notes (Signed)
Pt given water to drink for fluid challenge 

## 2018-09-26 NOTE — ED Notes (Signed)
Pt tolerated fluid challenge well. 

## 2018-09-26 NOTE — Discharge Instructions (Signed)
Take loperamide (Imodium A-D) as needed for diarrhea. ?

## 2018-09-28 ENCOUNTER — Telehealth (HOSPITAL_COMMUNITY): Payer: Self-pay | Admitting: *Deleted

## 2018-09-29 ENCOUNTER — Encounter (HOSPITAL_COMMUNITY): Payer: Self-pay | Admitting: *Deleted

## 2018-09-29 NOTE — Progress Notes (Signed)
Pt began taking Xarelto beginning of October. He is finishing up 5 months of treatment. He is scheduled to have epidural pain injections in his back on 3/12 and was told he needed to come off the xarelto 5 days before.  Patient wants to be sure it is okay.   I spoke with Dr. Delton Coombes and since it is about 3 weeks before he was supposed to quit the regimen anyway, it is okay for the patient to stop the xarelto.  He will have blood work in 4 weeks and then follow up with Dr. Delton Coombes.  I notified patient to stop on 3/7 and rescheduled his appointments.  Patient verbalizes understanding.

## 2018-09-29 NOTE — Telephone Encounter (Signed)
Message sent to provider 

## 2018-10-07 ENCOUNTER — Encounter: Payer: Self-pay | Admitting: Pulmonary Disease

## 2018-10-07 ENCOUNTER — Ambulatory Visit (INDEPENDENT_AMBULATORY_CARE_PROVIDER_SITE_OTHER): Payer: 59 | Admitting: Pulmonary Disease

## 2018-10-07 VITALS — BP 140/88 | HR 80 | Temp 98.6°F | Ht 73.0 in | Wt 231.2 lb

## 2018-10-07 DIAGNOSIS — J449 Chronic obstructive pulmonary disease, unspecified: Secondary | ICD-10-CM | POA: Diagnosis not present

## 2018-10-07 DIAGNOSIS — J441 Chronic obstructive pulmonary disease with (acute) exacerbation: Secondary | ICD-10-CM | POA: Diagnosis not present

## 2018-10-07 DIAGNOSIS — J302 Other seasonal allergic rhinitis: Secondary | ICD-10-CM

## 2018-10-07 DIAGNOSIS — Z87891 Personal history of nicotine dependence: Secondary | ICD-10-CM

## 2018-10-07 DIAGNOSIS — J432 Centrilobular emphysema: Secondary | ICD-10-CM

## 2018-10-07 DIAGNOSIS — R911 Solitary pulmonary nodule: Secondary | ICD-10-CM | POA: Diagnosis not present

## 2018-10-07 DIAGNOSIS — I82431 Acute embolism and thrombosis of right popliteal vein: Secondary | ICD-10-CM

## 2018-10-07 MED ORDER — PREDNISONE 10 MG PO TABS: ORAL_TABLET | ORAL | 0 refills | Status: DC

## 2018-10-07 MED ORDER — AZITHROMYCIN 250 MG PO TABS: ORAL_TABLET | ORAL | 0 refills | Status: DC

## 2018-10-07 NOTE — Progress Notes (Signed)
Synopsis: Referred in August 2019 for COPD, RUL pulmonary nodule by [name], MD  Subjective:   PATIENT ID: [name] Needs GENDER: male DOB: 1955-09-25, MRN: [id number]  Chief Complaint  Patient presents with  . Follow-up    States he is having increased SOB with dry cough since yesterday.     Initial office visit: Patient has a past medical history of obstructive sleep apnea, prostate cancer as well as tobacco abuse and has been labeled with COPD in the past.  No prior PFTs.  He was recently seen by his PCP with complaints of worsening shortness of breath, 35-pack-year history of smoking, quit smoking 2 years ago, started vaping but recently quit vaping approximately 3 to 4 months ago. He is active outside, plays golf regularly and swims. 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 He gets short winded, SOB and DOE with any exertion. He feels like he has had to slow down due to his symptoms. 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 He has noticed people asking is he is "ok" are you breathing okay situation. He has a had a few episodes where he was seen in the ED. He was at work and couldn't get his breath and he was transported from work.  He has never been intubated or placed on mechanical ventilation.  He has never needed NIPPV for an exacerbation.  OV 05/12/2018: Since our last visit a PET scan was completed for further evaluation of the lung nodule seen on initial LDCT. This revealed low level uptake now with planned follow up. However there was abnormal uptake within the colon which led to a C-scope by GI revealing multiple polyps, tubular adenomas, no cancer detected and colonic diverticulosis. On 10/1 he had a LE Duplex + Right Popliteal DVT and he was started on Xarelto.  This was considered a provoked DVT following a 4-hour flight coming back from [place].  He did state that his breathing was difficult while he was in [place].  He did have  difficulty with exertion and climbing out of the [facility] when he was out with a group sightseeing.  At this point denies any additional chest pain, wheezing or chest tightness.  He has been using his medication regularly.  OV 07/07/2018: He was started on Trelegy after last office visit.  He is here today for follow-up to make sure that he is still doing well with his breathing.  He is very excited to tell me that he is feeling much better.  He has noticed a significant difference in his ability to walk further and get more done at work.  He still been able to play golf on occasion.  He feels like he is less breathless with significant exertion.  He denies any ongoing cough.  He is somewhat anxious about everything that has happened this past year.  After finding the lung nodule that needs to be followed up on CT as well as the PET scan which led to evaluation of the colon and the multiple colon polyps.  He also has follow-up with GI shortly to discuss this again.  He also developed a blood clot and currently on Xarelto.  He also has follow-up with hematology after he completes therapy with his Xarelto.  OV 10/07/2018: increased SOB for the past 3 days. He has feeling for the past few days.  Has noticed nocturnal wheezing.  Has been using his nebulized treatments.  He is fixing to  go on a golf trip for the next couple of days.  He has been using his Trelegy inhaler regularly.  He has noticed predominantly more labored breathing with exertion.  He has not had much change in his sputum production.  He does state that he is finishing his Xarelto therapy on 3/7 to complete 3 months.  He did have a recent viral gastroenteritis about a month ago which required ER visit.  He does feel like if he could breathe a little bit better he would be back to his previous baseline.  We did discuss his prior CT imaging that was completed in November.  He is scheduled for 66-month low-dose cancer screening CT follow-up.    Past  Medical History:  Diagnosis Date  . Bilateral shoulder bursitis   . COPD (chronic obstructive pulmonary disease) with emphysema ([place])   . DDD (degenerative disc disease), lumbar    hx epideral injection  L5--S1   . Dysphasia    intermittant w/ food   . Dyspnea   . Dyspnea on exertion   . GERD (gastroesophageal reflux disease)   . Hiatal hernia   . History of acute pancreatitis    alcoholic pancreatitis [id number] and 01-30-2012  . History of colon polyps    10-17-2005  hyperplastic polyp's  . History of esophageal stricture    s/p  dilation 02-02-2017  . History of peptic ulcer 1990s  . History of pneumococcal pneumonia 2006   bilateral pneumonia w/ left lower lobe abscess treated w/ chest tube with suction for drainage  . History of pneumothorax    1984--  left spontaneous pneumothorax ,  treated w/ chest tube  . Hypertension   . Malignant neoplasm of prostate [facility]) urologist-  dr dahlstedt/  oncologist -- dr Tammi Klippel   dx 12-02-2016-- Stage T2b, Gleason 3+4,  PSA 5.99,  vol 25cc  . Numbness of left foot    outer left ankle numb-- per pt has appt. w/ neurologist  . OSA (obstructive sleep apnea)    per pt has not used cpap over year ago from 04-16-2017--- per study 01-17-2010  severe osa  . Solid nodule of lung greater than 8 mm in diameter 05/11/2018   03/2018: RUL Nodule, 45mm  . Wears glasses      Family History  Problem Relation Age of Onset  . Cancer Neg Hx   . Colon cancer Neg Hx   . Colon polyps Neg Hx   . Neuropathy Neg Hx      Social History   Socioeconomic History  . Marital status: Married    Spouse name: Not on file  . Number of children: Not on file  . Years of education: Not on file  . Highest education level: Not on file  Occupational History  . Not on file  Social Needs  . Financial resource strain: Not on file  . Food insecurity:    Worry: Not on file    Inability: Not on file  . Transportation needs:    Medical: Not on file    Non-medical:  Not on file  Tobacco Use  . Smoking status: Former Smoker    Packs/day: 1.00    Years: 30.00    Pack years: 30.00    Types: Cigarettes, E-cigarettes    Last attempt to quit: 10/03/2015    Years since quitting: 3.0  . Smokeless tobacco: Never Used  . Tobacco comment: stoped vaping 2018  Substance and Sexual Activity  . Alcohol use: Yes  Alcohol/week: 21.0 standard drinks    Types: 21 Shots of liquor per week    Comment: 2-3 drinks daily (Wine)   . Drug use: No  . Sexual activity: Yes    Birth control/protection: None  Lifestyle  . Physical activity:    Days per week: Not on file    Minutes per session: Not on file  . Stress: Not on file  Relationships  . Social connections:    Talks on phone: Not on file    Gets together: Not on file    Attends religious service: Not on file    Active member of club or organization: Not on file    Attends meetings of clubs or organizations: Not on file    Relationship status: Not on file  . Intimate partner violence:    Fear of current or ex partner: Not on file    Emotionally abused: Not on file    Physically abused: Not on file    Forced sexual activity: Not on file  Other Topics Concern  . Not on file  Social History Narrative  . Not on file     No Known Allergies   Outpatient Medications Prior to Visit  Medication Sig Dispense Refill  . albuterol (PROVENTIL HFA;VENTOLIN HFA) 108 (90 Base) MCG/ACT inhaler Inhale 2 puffs into the lungs every 4 (four) hours as needed for wheezing or shortness of breath. 1 Inhaler 5  . albuterol (PROVENTIL) (2.5 MG/3ML) 0.083% nebulizer solution Take 3 mLs (2.5 mg total) by nebulization every 6 (six) hours as needed for wheezing or shortness of breath. 300 mL 5  . Fluticasone-Umeclidin-Vilant (TRELEGY ELLIPTA) 100-62.5-25 MCG/INH AEPB Inhale 1 puff into the lungs daily. 60 each 6  . metoCLOPramide (REGLAN) 10 MG tablet Take 1 tablet (10 mg total) by mouth every 6 (six) hours as needed for nausea (or  headache). 30 tablet 0  . rivaroxaban (XARELTO) 20 MG TABS tablet Take 20 mg by mouth daily with supper.     No facility-administered medications prior to visit.     Review of Systems  Constitutional: Negative for chills, fever, malaise/fatigue and weight loss.  HENT: Negative for hearing loss, sore throat and tinnitus.   Eyes: Negative for blurred vision and double vision.  Respiratory: Positive for cough, shortness of breath and wheezing. Negative for hemoptysis, sputum production and stridor.   Cardiovascular: Negative for chest pain, palpitations, orthopnea, leg swelling and PND.  Gastrointestinal: Negative for abdominal pain, constipation, diarrhea, heartburn, nausea and vomiting.  Genitourinary: Negative for dysuria, hematuria and urgency.  Musculoskeletal: Negative for joint pain and myalgias.  Skin: Negative for itching and rash.  Neurological: Negative for dizziness, tingling, weakness and headaches.  Endo/Heme/Allergies: Negative for environmental allergies. Does not bruise/bleed easily.  Psychiatric/Behavioral: Negative for depression. The patient is not nervous/anxious and does not have insomnia.   All other systems reviewed and are negative.    Objective:  Physical Exam Vitals signs reviewed.  Constitutional:      General: He is not in acute distress.    Appearance: He is well-developed. He is obese.  HENT:     Head: Normocephalic and atraumatic.  Eyes:     General: No scleral icterus.    Conjunctiva/sclera: Conjunctivae normal.     Pupils: Pupils are equal, round, and reactive to light.  Neck:     Musculoskeletal: Neck supple.     Vascular: No JVD.     Trachea: No tracheal deviation.  Cardiovascular:     Rate and Rhythm:  Normal rate and regular rhythm.     Heart sounds: Normal heart sounds. No murmur.  Pulmonary:     Effort: Pulmonary effort is normal. No tachypnea, accessory muscle usage or respiratory distress.     Breath sounds: No stridor. Wheezing  present. No rhonchi or rales.     Comments: Shallow breaths bilaterally, very faint wheeze Abdominal:     General: Bowel sounds are normal. There is no distension.     Palpations: Abdomen is soft.     Tenderness: There is no abdominal tenderness.  Musculoskeletal:        General: No tenderness.  Lymphadenopathy:     Cervical: No cervical adenopathy.  Skin:    General: Skin is warm and dry.     Capillary Refill: Capillary refill takes less than 2 seconds.     Findings: No rash.  Neurological:     Mental Status: He is alert and oriented to person, place, and time.  Psychiatric:        Behavior: Behavior normal.     Vitals:   10/07/18 0840  BP: 140/88  Pulse: 80  Temp: 98.6 F (37 C)  TempSrc: Oral  SpO2: 97%  Weight: 231 lb 3.2 oz (104.9 kg)  Height: 6\' 1"  (1.854 m)   97% on RA BMI Readings from Last 3 Encounters:  10/07/18 30.50 kg/m  09/25/18 30.34 kg/m  09/03/18 30.74 kg/m   Wt Readings from Last 3 Encounters:  10/07/18 231 lb 3.2 oz (104.9 kg)  09/25/18 230 lb (104.3 kg)  09/03/18 233 lb (105.7 kg)     CBC    Component Value Date/Time   WBC 4.6 09/25/2018 2235   RBC 5.02 09/25/2018 2235   HGB 16.4 09/25/2018 2235   HCT 49.8 09/25/2018 2235   PLT 158 09/25/2018 2235   MCV 99.2 09/25/2018 2235   MCH 32.7 09/25/2018 2235   MCHC 32.9 09/25/2018 2235   RDW 14.5 09/25/2018 2235   LYMPHSABS 2.4 06/23/2017 0045   MONOABS 0.9 06/23/2017 0045   EOSABS 0.4 06/23/2017 0045   BASOSABS 0.0 06/23/2017 0045     Chest Imaging: LDCT 03/2018 - RUL Lung nodule, approximately 20mm with areas of surrounding scar, significant BL upper lobe predominant emphysema The patient's images have been independently reviewed by me.    PET Scan: 03/23/2018 Mild uptake in the RUL density. Extensive colonic uptake from PET scan  07/20/2018: CT chest  Lung RADS 2, three-vessel coronary disease, stable right upper lobe pulmonary nodule. The patient's images have been  independently reviewed by me.     Pulmonary Functions Testing Results: PFT Results Latest Ref Rng & Units 03/22/2018  FVC-Pre L 3.30  FVC-Predicted Pre % 63  FVC-Post L 3.40  FVC-Predicted Post % 65  Pre FEV1/FVC % % 64  Post FEV1/FCV % % 69  FEV1-Pre L 2.12  FEV1-Predicted Pre % 54  FEV1-Post L 2.36  DLCO UNC% % 52  DLCO COR %Predicted % 69  TLC L 4.69  TLC % Predicted % 62  RV % Predicted % 38    FeNO: None  Pathology: None   Echocardiogram: None   Heart Catheterization: None     Assessment & Plan:   COPD with acute exacerbation ([name])  Nodule of upper lobe of right lung  Former smoker  Moderate COPD (chronic obstructive pulmonary disease) ([name])  Centrilobular emphysema ([name])  Deep vein thrombosis (DVT) of popliteal vein of right lower extremity, unspecified chronicity ([name])  Seasonal allergies   Discussion:  This 63 year old  gentleman with moderate COPD, maintenance of Trelegy inhaler.  At this point over the past 3 days has increased shortness of breath chest tightness and difficulty with dyspnea.  I do believe he is experiencing start of an exacerbation.  He does have some upper nasal congestion.  He is not sure if this is been triggered by allergies.  He has been using an antihistamine nasal spray.  He denies any recent sick contacts he has no fevers.  He is fixing to go out of town for the next couple of days.  He is leaving on Saturday.  We will treat him as a COPD exacerbation with a prednisone taper and 5 days of azithromycin. These orders have been sent to his pharmacy. He needs to continue his current Trelegy inhaler regimen. He can continue the use of albuterol for shortness of breath and wheezing. He is scheduled to stop his Xarelto per his hematologist. He needs a 33-month low-dose lung cancer screening CT to can be completed in December 2020. He has already been referral for enrollment in our lung cancer screening program. He has sleep follow-up  at neurology clinic.  Patient to return to clinic in 3 months or as needed.  Greater than 50% of this patient's 25-minute of visit was been face-to-face discussing above recommendations and treatment plan.   Current Outpatient Medications:  .  albuterol (PROVENTIL HFA;VENTOLIN HFA) 108 (90 Base) MCG/ACT inhaler, Inhale 2 puffs into the lungs every 4 (four) hours as needed for wheezing or shortness of breath., Disp: 1 Inhaler, Rfl: 5 .  albuterol (PROVENTIL) (2.5 MG/3ML) 0.083% nebulizer solution, Take 3 mLs (2.5 mg total) by nebulization every 6 (six) hours as needed for wheezing or shortness of breath., Disp: 300 mL, Rfl: 5 .  Fluticasone-Umeclidin-Vilant (TRELEGY ELLIPTA) 100-62.5-25 MCG/INH AEPB, Inhale 1 puff into the lungs daily., Disp: 60 each, Rfl: 6 .  metoCLOPramide (REGLAN) 10 MG tablet, Take 1 tablet (10 mg total) by mouth every 6 (six) hours as needed for nausea (or headache)., Disp: 30 tablet, Rfl: 0 .  rivaroxaban (XARELTO) 20 MG TABS tablet, Take 20 mg by mouth daily with supper., Disp: , Rfl:    Garner Nash, DO [facility] 10/07/2018 8:51 AM

## 2018-10-07 NOTE — Patient Instructions (Addendum)
Thank you for visiting Dr. Valeta Harms at [facility]. Today we recommend the following:  Meds ordered this encounter  Medications  . predniSONE (DELTASONE) 10 MG tablet    Sig: Take 4 tabs by mouth once daily x4 days, then 3 tabs x4 days, 2 tabs x4 days, 1 tab x4 days and stop.    Dispense:  40 tablet    Refill:  0  . azithromycin (ZITHROMAX) 250 MG tablet    Sig: Take as directed    Dispense:  6 tablet    Refill:  0   Start allegra or generic brand 180mg  once daily.   Return in about 3 months (around 01/07/2019).

## 2018-11-05 ENCOUNTER — Inpatient Hospital Stay (HOSPITAL_COMMUNITY): Payer: 59 | Attending: Hematology

## 2018-11-05 ENCOUNTER — Other Ambulatory Visit: Payer: Self-pay

## 2018-11-05 DIAGNOSIS — Z79899 Other long term (current) drug therapy: Secondary | ICD-10-CM | POA: Insufficient documentation

## 2018-11-05 DIAGNOSIS — I82431 Acute embolism and thrombosis of right popliteal vein: Secondary | ICD-10-CM | POA: Insufficient documentation

## 2018-11-05 DIAGNOSIS — Z87891 Personal history of nicotine dependence: Secondary | ICD-10-CM | POA: Insufficient documentation

## 2018-11-05 DIAGNOSIS — Z923 Personal history of irradiation: Secondary | ICD-10-CM | POA: Diagnosis not present

## 2018-11-05 DIAGNOSIS — D6851 Activated protein C resistance: Secondary | ICD-10-CM | POA: Insufficient documentation

## 2018-11-05 DIAGNOSIS — C61 Malignant neoplasm of prostate: Secondary | ICD-10-CM | POA: Insufficient documentation

## 2018-11-05 DIAGNOSIS — Z7901 Long term (current) use of anticoagulants: Secondary | ICD-10-CM | POA: Insufficient documentation

## 2018-11-05 LAB — D-DIMER, QUANTITATIVE: D-Dimer, Quant: 1.01 ug/mL-FEU — ABNORMAL HIGH (ref 0.00–0.50)

## 2018-11-06 LAB — LUPUS ANTICOAGULANT PANEL
DRVVT: 42.6 s (ref 0.0–47.0)
PTT Lupus Anticoagulant: 31.6 s (ref 0.0–51.9)

## 2018-11-15 ENCOUNTER — Inpatient Hospital Stay (HOSPITAL_BASED_OUTPATIENT_CLINIC_OR_DEPARTMENT_OTHER): Payer: 59 | Admitting: Hematology

## 2018-11-15 ENCOUNTER — Encounter (HOSPITAL_COMMUNITY): Payer: Self-pay | Admitting: Hematology

## 2018-11-15 ENCOUNTER — Other Ambulatory Visit: Payer: Self-pay

## 2018-11-15 ENCOUNTER — Ambulatory Visit (HOSPITAL_COMMUNITY)
Admission: RE | Admit: 2018-11-15 | Discharge: 2018-11-15 | Disposition: A | Discharge status: Home / Self Care | Payer: 59 | Source: Ambulatory Visit | Attending: Hematology | Admitting: Hematology

## 2018-11-15 VITALS — BP 145/87 | HR 70 | Temp 98.5°F | Resp 16 | Wt 229.9 lb

## 2018-11-15 DIAGNOSIS — I82431 Acute embolism and thrombosis of right popliteal vein: Secondary | ICD-10-CM | POA: Diagnosis present

## 2018-11-15 DIAGNOSIS — Z7901 Long term (current) use of anticoagulants: Secondary | ICD-10-CM

## 2018-11-15 DIAGNOSIS — D6851 Activated protein C resistance: Secondary | ICD-10-CM

## 2018-11-15 DIAGNOSIS — C61 Malignant neoplasm of prostate: Secondary | ICD-10-CM | POA: Diagnosis not present

## 2018-11-15 DIAGNOSIS — Z79899 Other long term (current) drug therapy: Secondary | ICD-10-CM

## 2018-11-15 DIAGNOSIS — Z923 Personal history of irradiation: Secondary | ICD-10-CM

## 2018-11-15 DIAGNOSIS — Z87891 Personal history of nicotine dependence: Secondary | ICD-10-CM

## 2018-11-15 NOTE — Patient Instructions (Addendum)
[place] at [facility] Discharge Instructions  You were seen today by Dr. Delton Coombes. He went over your recent lab results. He would like for you to have dopplers done of both your legs. He will see you back in 3 months for labs and follow up.   Thank you for choosing [place] at [facility] to provide your oncology and hematology care.  To afford each patient quality time with our provider, please arrive at least 15 minutes before your scheduled appointment time.   If you have a lab appointment with the [facility] please come in thru the  Main Entrance and check in at the main information desk  You need to re-schedule your appointment should you arrive 10 or more minutes late.  We strive to give you quality time with our providers, and arriving late affects you and other patients whose appointments are after yours.  Also, if you no show three or more times for appointments you may be dismissed from the clinic at the providers discretion.     Again, thank you for choosing [facility].  Our hope is that these requests will decrease the amount of time that you wait before being seen by our physicians.       _____________________________________________________________  Should you have questions after your visit to [facility], please contact our office at [phone] between the hours of 8:00 a.m. and 4:30 p.m.  Voicemails left after 4:00 p.m. will not be returned until the following business day.  For prescription refill requests, have your pharmacy contact our office and allow 72 hours.    Cancer Center Support Programs:   > Cancer Support Group  2nd Tuesday of the month 1pm-2pm, Journey Room

## 2018-11-15 NOTE — Progress Notes (Signed)
[place], [place] [address]   CLINIC:  Medical Oncology/Hematology  PCP:  [name], MD [place] [address] [phone]   REASON FOR VISIT:  Follow-up for right leg DVT  CURRENT THERAPY: Xarelto daily     INTERVAL HISTORY:  Mr. Viernes 63 y.o. male returns for routine follow-up. He is here today alone. He states that he has been feeling ok since stopping the xarelto. Denies any lower extremity pain. Denies any nausea, vomiting, or diarrhea. Denies any new pains. Had not noticed any recent bleeding such as epistaxis, hematuria or hematochezia. Denies recent chest pain on exertion, shortness of breath on minimal exertion, pre-syncopal episodes, or palpitations. Denies any numbness or tingling in hands or feet. Denies any recent fevers, infections, or recent hospitalizations. Patient reports appetite at 100% and energy level at 100%.   REVIEW OF SYSTEMS:  Review of Systems  Musculoskeletal: Positive for back pain.  All other systems reviewed and are negative.    PAST MEDICAL/SURGICAL HISTORY:  Past Medical History:  Diagnosis Date  . Bilateral shoulder bursitis   . COPD (chronic obstructive pulmonary disease) with emphysema ([place])   . DDD (degenerative disc disease), lumbar    hx epideral injection  L5--S1   . Dysphasia    intermittant w/ food   . Dyspnea   . Dyspnea on exertion   . GERD (gastroesophageal reflux disease)   . Hiatal hernia   . History of acute pancreatitis    alcoholic pancreatitis [id number] and 01-30-2012  . History of colon polyps    10-17-2005  hyperplastic polyp's  . History of esophageal stricture    s/p  dilation 02-02-2017  . History of peptic ulcer 1990s  . History of pneumococcal pneumonia 2006   bilateral pneumonia w/ left lower lobe abscess treated w/ chest tube with suction for drainage  . History of pneumothorax    1984--  left spontaneous pneumothorax ,  treated w/ chest  tube  . Hypertension   . Malignant neoplasm of prostate [facility]) urologist-  dr dahlstedt/  oncologist -- dr Tammi Klippel   dx 12-02-2016-- Stage T2b, Gleason 3+4,  PSA 5.99,  vol 25cc  . Numbness of left foot    outer left ankle numb-- per pt has appt. w/ neurologist  . OSA (obstructive sleep apnea)    per pt has not used cpap over year ago from 04-16-2017--- per study 01-17-2010  severe osa  . Solid nodule of lung greater than 8 mm in diameter 05/11/2018   03/2018: RUL Nodule, 66mm  . Wears glasses    Past Surgical History:  Procedure Laterality Date  . BIOPSY  02/02/2017   Procedure: BIOPSY;  Surgeon: Daneil Dolin, MD;  [facility];  Service: Endoscopy;;  duodenal bx's, esophgeal bx's  . BIOPSY  07/30/2017   Procedure: BIOPSY;  Surgeon: Daneil Dolin, MD;  [facility];  Service: Endoscopy;;  esophagus  . CARDIOVASCULAR STRESS TEST  09/12/2009   normal nuclear perfusion study w/ no ischemia /  normal LV function and wall motion , ef 57%  . COLONOSCOPY  2007   Dr. Gala Romney: hyperplastic polyps, internal hemorrhoids   . COLONOSCOPY WITH PROPOFOL N/A 05/03/2018   Dr. Gala Romney: diverticulosis in entire colon, multiple polyps in sigmoid, at splenic flexure, and ascending colon, not all polyps removed. Tubular adenomas and inflammatory polyps. Needs 3 year surveilance  . ESOPHAGOGASTRODUODENOSCOPY (EGD) WITH PROPOFOL N/A 02/02/2017   Dr. Gala Romney: esophageal  stenosis s/p dilation and biopsy, medium sized hiatal hernia, normal duodenum  . ESOPHAGOGASTRODUODENOSCOPY (EGD) WITH PROPOFOL N/A 07/30/2017   Dr. Gala Romney: esophageal stenosis s/p dilation and biopsy, medium-sized hiatal hernia, normal duodenum  . HEMORRHOID SURGERY  01-21-2008    [facility]  . MALONEY DILATION N/A 02/02/2017   Procedure: Venia Minks DILATION;  Surgeon: Daneil Dolin, MD;  [facility];  Service: Endoscopy;  Laterality: N/A;  . Venia Minks DILATION N/A 07/30/2017   Procedure: Venia Minks DILATION;  Surgeon: Daneil Dolin, MD;  [facility];  Service: Endoscopy;  Laterality: N/A;  . POLYPECTOMY  05/03/2018   Procedure: POLYPECTOMY;  Surgeon: Daneil Dolin, MD;  [facility];  Service: Endoscopy;;  ascending colon polyp, sigmoid colon polyps (multiple)  . RADIOACTIVE SEED IMPLANT N/A 04/23/2017   Procedure: RADIOACTIVE SEED IMPLANT/BRACHYTHERAPY IMPLANT;  Surgeon: Franchot Gallo, MD;  Location[facility];  Service: Urology;  Laterality: N/A;  . SPACE OAR INSTILLATION N/A 04/23/2017   Procedure: SPACE OAR INSTILLATION;  Surgeon: Franchot Gallo, MD;  Location: [facility];  Service: Urology;  Laterality: N/A;  . TRANSTHORACIC ECHOCARDIOGRAM  03/28/2009   mild LVH, ef 55-60%/ mild aorta calcification  . VIDEO ASSISTED THORACOSCOPY (VATS)/DECORTICATION Left      SOCIAL HISTORY:  Social History   Socioeconomic History  . Marital status: Married    Spouse name: Not on file  . Number of children: Not on file  . Years of education: Not on file  . Highest education level: Not on file  Occupational History  . Not on file  Social Needs  . Financial resource strain: Not on file  . Food insecurity:    Worry: Not on file    Inability: Not on file  . Transportation needs:    Medical: Not on file    Non-medical: Not on file  Tobacco Use  . Smoking status: Former Smoker    Packs/day: 1.00    Years: 30.00    Pack years: 30.00    Types: Cigarettes, E-cigarettes    Last attempt to quit: 10/03/2015    Years since quitting: 3.1  . Smokeless tobacco: Never Used  . Tobacco comment: stoped vaping 2018  Substance and Sexual Activity  . Alcohol use: Yes    Alcohol/week: 21.0 standard drinks    Types: 21 Shots of liquor per week    Comment: 2-3 drinks daily (Wine)   . Drug use: No  . Sexual activity: Yes    Birth control/protection: None  Lifestyle  . Physical activity:    Days per week: Not on file    Minutes per session: Not on file  .  Stress: Not on file  Relationships  . Social connections:    Talks on phone: Not on file    Gets together: Not on file    Attends religious service: Not on file    Active member of club or organization: Not on file    Attends meetings of clubs or organizations: Not on file    Relationship status: Not on file  . Intimate partner violence:    Fear of current or ex partner: Not on file    Emotionally abused: Not on file    Physically abused: Not on file    Forced sexual activity: Not on file  Other Topics Concern  . Not on file  Social History Narrative  . Not on file    FAMILY HISTORY:  Family History  Problem Relation Age  of Onset  . Cancer Neg Hx   . Colon cancer Neg Hx   . Colon polyps Neg Hx   . Neuropathy Neg Hx     CURRENT MEDICATIONS:  Outpatient Encounter Medications as of 11/15/2018  Medication Sig  . albuterol (PROVENTIL HFA;VENTOLIN HFA) 108 (90 Base) MCG/ACT inhaler Inhale 2 puffs into the lungs every 4 (four) hours as needed for wheezing or shortness of breath.  Marland Kitchen albuterol (PROVENTIL) (2.5 MG/3ML) 0.083% nebulizer solution Take 3 mLs (2.5 mg total) by nebulization every 6 (six) hours as needed for wheezing or shortness of breath.  Marland Kitchen albuterol (PROVENTIL) (2.5 MG/3ML) 0.083% nebulizer solution albuterol sulfate 2.5 mg/3 mL (0.083 %) solution for nebulization  . Fluticasone-Umeclidin-Vilant (TRELEGY ELLIPTA) 100-62.5-25 MCG/INH AEPB Inhale 1 puff into the lungs daily.  . pantoprazole (PROTONIX) 40 MG tablet   . azithromycin (ZITHROMAX) 250 MG tablet Take as directed (Patient not taking: Reported on 11/15/2018)  . metoCLOPramide (REGLAN) 10 MG tablet Take 1 tablet (10 mg total) by mouth every 6 (six) hours as needed for nausea (or headache). (Patient not taking: Reported on 11/15/2018)  . predniSONE (DELTASONE) 10 MG tablet Take 4 tabs by mouth once daily x4 days, then 3 tabs x4 days, 2 tabs x4 days, 1 tab x4 days and stop. (Patient not taking: Reported on 11/15/2018)  .  rivaroxaban (XARELTO) 20 MG TABS tablet Take 20 mg by mouth daily with supper.   No facility-administered encounter medications on file as of 11/15/2018.     ALLERGIES:  No Known Allergies   PHYSICAL EXAM:  ECOG Performance status: 0  Vitals:   11/15/18 0838  BP: (!) 145/87  Pulse: 70  Resp: 16  Temp: 98.5 F (36.9 C)  SpO2: 98%   Filed Weights   11/15/18 0838  Weight: 229 lb 14.4 oz (104.3 kg)    Physical Exam Vitals signs reviewed.  Constitutional:      Appearance: Normal appearance.  Cardiovascular:     Rate and Rhythm: Normal rate and regular rhythm.     Heart sounds: Normal heart sounds.  Pulmonary:     Effort: Pulmonary effort is normal.     Breath sounds: Normal breath sounds.  Abdominal:     General: There is no distension.     Palpations: Abdomen is soft.     Tenderness: There is no abdominal tenderness.  Musculoskeletal:        General: No swelling.  Skin:    General: Skin is warm.  Neurological:     General: No focal deficit present.     Mental Status: He is alert and oriented to person, place, and time.  Psychiatric:        Mood and Affect: Mood normal.        Behavior: Behavior normal.      LABORATORY DATA:  I have reviewed the labs as listed.  CBC    Component Value Date/Time   WBC 4.6 09/25/2018 2235   RBC 5.02 09/25/2018 2235   HGB 16.4 09/25/2018 2235   HCT 49.8 09/25/2018 2235   PLT 158 09/25/2018 2235   MCV 99.2 09/25/2018 2235   MCH 32.7 09/25/2018 2235   MCHC 32.9 09/25/2018 2235   RDW 14.5 09/25/2018 2235   LYMPHSABS 2.4 06/23/2017 0045   MONOABS 0.9 06/23/2017 0045   EOSABS 0.4 06/23/2017 0045   BASOSABS 0.0 06/23/2017 0045   CMP Latest Ref Rng & Units 09/25/2018 04/23/2018 06/23/2017  Glucose 70 - 99 mg/dL 178(H) 127(H) 99  BUN 8 -  23 mg/dL 16 15 13   Creatinine 0.61 - 1.24 mg/dL 1.01 1.39(H) 0.80  Sodium 135 - 145 mmol/L 142 141 141  Potassium 3.5 - 5.1 mmol/L 4.0 4.1 4.0  Chloride 98 - 111 mmol/L 106 110 107  CO2 22  - 32 mmol/L 23 25 25   Calcium 8.9 - 10.3 mg/dL 8.9 8.8(L) 9.6  Total Protein 6.5 - 8.1 g/dL 7.6 - 7.4  Total Bilirubin 0.3 - 1.2 mg/dL 1.0 - 0.8  Alkaline Phos 38 - 126 U/L 53 - 52  AST 15 - 41 U/L 42(H) - 31  ALT 0 - 44 U/L 39 - 31       DIAGNOSTIC IMAGING:  I have independently reviewed the scans and discussed with the patient.   I have reviewed Venita Lick LPN's note and agree with the documentation.  I personally performed a face-to-face visit, made revisions and my assessment and plan is as follows.    ASSESSMENT & PLAN:   DVT (deep venous thrombosis) ([place]) 1.  Right leg DVT (weakly provoked): - Patient reportedly had a 4-hour flight and felt cramping in his right leg for the next 4 days. - An ultrasound done on 05/04/2018 showed occlusive DVT in the right popliteal and peroneal veins. - He reports that he was checked 15 years ago for factor V Leiden and was positive.  His daughter was positive and had a history of multiple DVTs and PEs. - Xarelto from October 2019 through February 2020. -He stopped Xarelto few weeks before his ended as he was having some back injections. -Does not report any pains in the legs. -Initial lupus anticoagulant test was positive when he was on Xarelto. -He is heterozygous for factor V Leiden mutation.  This does not alter the duration of anticoagulation. - D-dimer on 11/05/2018 was elevated at 1.01.  Lupus anticoagulant repeat test was negative. - I have done ultrasound Doppler today.  This was negative for DVT. - We will reevaluate him in 3 months with repeat blood work.  2.  Prostate cancer: -Seed implantation and brachytherapy in October 2018. -He follows up with Dr. Diona Fanti.  Last PSA was reportedly undetectable.  3.  Smoking history: -PET scan on 03/23/2018 following CT chest on 03/18/2018 -CT chest LCS protocol on 07/20/2018 shows lung RADS 2S. - Low-dose CT in 12 months is recommended.       Orders placed this encounter:   Orders Placed This Encounter  Procedures  . US Venous Img Lower Bilateral      Derek Jack, MD [place] [phone]

## 2018-11-15 NOTE — Assessment & Plan Note (Signed)
1.  Right leg DVT (weakly provoked): - Patient reportedly had a 4-hour flight and felt cramping in his right leg for the next 4 days. - An ultrasound done on 05/04/2018 showed occlusive DVT in the right popliteal and peroneal veins. - He reports that he was checked 15 years ago for factor V Leiden and was positive.  His daughter was positive and had a history of multiple DVTs and PEs. - Xarelto from October 2019 through February 2020. -He stopped Xarelto few weeks before his ended as he was having some back injections. -Does not report any pains in the legs. -Initial lupus anticoagulant test was positive when he was on Xarelto. -He is heterozygous for factor V Leiden mutation.  This does not alter the duration of anticoagulation. - D-dimer on 11/05/2018 was elevated at 1.01.  Lupus anticoagulant repeat test was negative. - I have done ultrasound Doppler today.  This was negative for DVT. - We will reevaluate him in 3 months with repeat blood work.  2.  Prostate cancer: -Seed implantation and brachytherapy in October 2018. -He follows up with Dr. Diona Fanti.  Last PSA was reportedly undetectable.  3.  Smoking history: -PET scan on 03/23/2018 following CT chest on 03/18/2018 -CT chest LCS protocol on 07/20/2018 shows lung RADS 2S. - Low-dose CT in 12 months is recommended.

## 2018-11-19 DIAGNOSIS — I82409 Acute embolism and thrombosis of unspecified deep veins of unspecified lower extremity: Secondary | ICD-10-CM

## 2018-11-19 HISTORY — DX: Acute embolism and thrombosis of unspecified deep veins of unspecified lower extremity: I82.409

## 2018-12-23 ENCOUNTER — Other Ambulatory Visit (HOSPITAL_COMMUNITY): Payer: 59

## 2018-12-29 ENCOUNTER — Telehealth: Payer: Self-pay | Admitting: Neurology

## 2018-12-29 NOTE — Telephone Encounter (Signed)
Pt gave consent for VV on the phone/ Pt understands that although there may be some limitations with this type of visit, we will take all precautions to reduce any security or privacy concerns.  Pt understands that this will be treated like an in office visit and we will file with pt's insurance, and there may be a patient responsible charge related to this service. Sent e-mail with provider link to [email or web address]

## 2018-12-30 ENCOUNTER — Ambulatory Visit (HOSPITAL_COMMUNITY): Payer: 59 | Admitting: Hematology

## 2019-01-04 ENCOUNTER — Encounter: Payer: Self-pay | Admitting: Neurology

## 2019-01-04 ENCOUNTER — Encounter: Payer: Self-pay | Admitting: *Deleted

## 2019-01-04 NOTE — Addendum Note (Signed)
Addended by: Gildardo Griffes on: 01/04/2019 01:34 PM   Modules accepted: Orders

## 2019-01-04 NOTE — Telephone Encounter (Signed)
Called pt and confirmed using 2 identifiers. Updated chart in preparation for virtual visit tomorrow at 8:30 AM. Medications, history, social history, weight updated. Pt confirmed he had received the link. He understands the directions to joining the meeting. I recommended he join 5-10 minutes early. He will receive a call at 8:00AM to check-in. Pt verbalized appreciation.

## 2019-01-05 ENCOUNTER — Encounter: Payer: Self-pay | Admitting: Neurology

## 2019-01-05 ENCOUNTER — Other Ambulatory Visit: Payer: Self-pay

## 2019-01-05 ENCOUNTER — Ambulatory Visit (INDEPENDENT_AMBULATORY_CARE_PROVIDER_SITE_OTHER): Payer: 59 | Admitting: Neurology

## 2019-01-05 DIAGNOSIS — M87059 Idiopathic aseptic necrosis of unspecified femur: Secondary | ICD-10-CM | POA: Diagnosis not present

## 2019-01-05 DIAGNOSIS — M5416 Radiculopathy, lumbar region: Secondary | ICD-10-CM | POA: Insufficient documentation

## 2019-01-05 NOTE — Progress Notes (Signed)
[name]    Provider:  Dr Jaynee Eagles Requesting Provider: Dr. Nelva Bush, Dr. Kristine Garbe Primary Care Provider:  [name], MD  CC:  Leg pain  Virtual Visit via Video Note  I connected with@ on 01/05/19 at  8:30 AM EDT by a video enabled telemedicine application and verified that I am speaking with the correct person using two identifiers. Patient is at home and physician is in the office.   I discussed the limitations of evaluation and management by telemedicine and the availability of in person appointments. The patient expressed understanding and agreed to proceed.  Joe Beam, MD   CC:  Leg pain  HPI:  [name] is a 63 y.o. male here as requested by Dr. Tonita Cong for lumbar radiculopathy. PMHx chronic low back pain.  It appears he is on gabapentin. Recent MRi showed AVN and patient scheduled for hip surgery. But also degenerative changes in the lumbar spine. Patient has a history of chronic low back pain.  He is on gabapentin, prednisone. Per Dr. Bernadette Hoit notes and Dr. Nelva Bush' notes, he has bilateral radiculopathy of the lower extremities, EHL weakness particularly on the left, no relief with conservative treatment.  Candidate for microlumbar decompression at L4-L5 and L5-S1 on the left. He has chronic back issues, he saw Dr. Maxie Better, had MRIs and he was skeptical of his hip and MRI of the hip showed pathology of the hip. He has hip and groin pain shooting down to the knee and groin area. He is going to see Dr. Alvan Dame for a hip replacement. He has difficulty walking with a limp, the most pain is getting up and down in the hip and groin area, intermittent pains "fierce" like lightning bolts. Pain is severe, It shoots into the groin and inner thigh area and down to the shin bone, most of the time is from the hip anterior to below the knee. Sitting can cause pain. Finding the right standing position makes it better. Steroid injections helped temporarily. Dr. Tonita Cong  diagnosed Avascular Necrosis. Pain with external/internal rotation of the hip.  Reviewed notes, labs and imaging from outside physicians, which showed:  reviewed [facility] notes. It appears he is also had L5-S1 epidural steroid injections.  He is being seen at orthopedics Dr. Maxie Better.  Degeneration of lumbar intervertebral discs and lumbar radiculopathy.  Injections by Dr. Delfino Lovett Ramo's.  As far as exam goes, tender at the lumbosacral junction, tenderness at the SI joint, no flank pain with percussion, limited flexion and extension, strength appears to be intact, seated straight leg raise test positive, toes equivalent, lower extremity reflexes appear normal, AP and lateral of the hip demonstrates no evidence of AVN, osteoarthritis.  MRI of the lumbar spine as above.  Impression was likely L5 radiculopathy, selective nerve root injection at the level of pathology at L5 was completed as above, performed under x-ray guidance by Dr. Herma Mering.  The patient had a recent DVT in his calf he was on Xarelto for that and has a hematologist that he follows.  Back pain and leg pain is worse since he had his last MRI in November 2019 it radiates to the left outside in the bottom as well as into the groin 3 years of duration.  Per Dr. being he has bilateral radiculopathy of the lower extremities, EHL weakness particularly on the left, no relief with conservative treatment.  We discussed activity modification strategies to avoid compensation of the nerve root.  Candidate for microlumbar decompression  at L4-L5 and L5-S1 on the left.  Personally reviewed MRI of the lumbar spine demonstrates multilevel degenerative changes most significant at the foramen at L5-S1 on the left, moderate facet arthrosis L5-S1 on the left, moderate to severe left lateral disc height loss.  Review of Systems: Patient complains of symptoms per HPI as well as the following symptoms: chronic low back pain, difficulty walking, joint pain. Pertinent  negatives and positives per HPI. All others negative.   Social History   Socioeconomic History   Marital status: Married    Spouse name: Not on file   Number of children: 2   Years of education: Not on file   Highest education level: Bachelor's degree (e.g., BA, AB, BS)  Occupational History   Not on file  Social Needs   Financial resource strain: Not on file   Food insecurity:    Worry: Not on file    Inability: Not on file   Transportation needs:    Medical: Not on file    Non-medical: Not on file  Tobacco Use   Smoking status: Former Smoker    Packs/day: 1.00    Years: 30.00    Pack years: 30.00    Types: Cigarettes, E-cigarettes    Last attempt to quit: 10/03/2015    Years since quitting: 3.2   Smokeless tobacco: Never Used   Tobacco comment: stoped vaping 2018  Substance and Sexual Activity   Alcohol use: Yes    Alcohol/week: 21.0 standard drinks    Types: 21 Shots of liquor per week    Comment: 2-3 drinks daily (Wine); 01/04/19 pt states he hasn't had anything to drink in 2-3 months   Drug use: No   Sexual activity: Yes    Birth control/protection: None  Lifestyle   Physical activity:    Days per week: Not on file    Minutes per session: Not on file   Stress: Not on file  Relationships   Social connections:    Talks on phone: Not on file    Gets together: Not on file    Attends religious service: Not on file    Active member of club or organization: Not on file    Attends meetings of clubs or organizations: Not on file    Relationship status: Not on file   Intimate partner violence:    Fear of current or ex partner: Not on file    Emotionally abused: Not on file    Physically abused: Not on file    Forced sexual activity: Not on file  Other Topics Concern   Not on file  Social History Narrative   Lives at home with his wife   Right handed    Family History  Problem Relation Age of Onset   Diabetes Mother    Heart disease Father     Heart disease Brother    Cancer Neg Hx    Colon cancer Neg Hx    Colon polyps Neg Hx    Neuropathy Neg Hx     Past Medical History:  Diagnosis Date   Bilateral shoulder bursitis    Chronic back pain    COPD (chronic obstructive pulmonary disease) with emphysema ([place])    DDD (degenerative disc disease), lumbar    hx epideral injection  L5--S1    DVT (deep venous thrombosis) ([place]) 11/19/2018   Dysphasia    intermittant w/ food    Dyspnea    Dyspnea on exertion    GERD (gastroesophageal reflux disease)  Gout    L great toe   Hiatal hernia    Hip problem 2020   left, seeing orthopedics   History of acute pancreatitis    alcoholic pancreatitis 39-10-90 and 01-30-2012   History of colon polyps    10-17-2005  hyperplastic polyp's   History of esophageal stricture    s/p  dilation 02-02-2017   History of peptic ulcer 1990s   History of pneumococcal pneumonia 2006   bilateral pneumonia w/ left lower lobe abscess treated w/ chest tube with suction for drainage   History of pneumothorax    1984--  left spontaneous pneumothorax ,  treated w/ chest tube   Hypertension    Malignant neoplasm of prostate [facility]) urologist-  dr dahlstedt/  oncologist -- dr Tammi Klippel   dx 12-02-2016-- Stage T2b, Gleason 3+4,  PSA 5.99,  vol 25cc   Numbness of left foot    outer left ankle numb-- per pt has appt. w/ neurologist   OSA (obstructive sleep apnea)    per pt has not used cpap over year ago from 04-16-2017--- per study 01-17-2010  severe osa   Solid nodule of lung greater than 8 mm in diameter 05/11/2018   03/2018: RUL Nodule, 59mm   Wears glasses     Patient Active Problem List   Diagnosis Date Noted   Avascular necrosis of femur ([place]) 01/05/2019   Lumbar radiculopathy 01/05/2019   History of colonic polyps 08/19/2018   Esophageal stenosis 08/19/2018   DVT (deep venous thrombosis) ([place]) 05/11/2018   Solid nodule of lung greater than 8 mm in diameter  05/11/2018   Abnormal CT scan, colon 04/13/2018   Obstructive sleep apnea treated with continuous positive airway pressure (CPAP) 09/29/2017   GERD (gastroesophageal reflux disease) 04/07/2017   Dysphagia 01/29/2017   Malignant neoplasm of prostate ([place]) 01/19/2017   Pancreatitis, alcoholic 33/00/7622   Alcoholism ([place]) 11/11/2011   Tobacco abuse 11/11/2011   Thrombocytopenia ([place]) 11/11/2011   HYPERSOMNIA, ASSOCIATED WITH SLEEP APNEA 09/27/2009   COPD 09/07/2009   Pneumothorax 09/07/2009   BURSITIS 09/07/2009   FATIGUE / MALAISE 09/07/2009   SHORTNESS OF BREATH 09/07/2009   PNEUMOTHORAX 09/07/2009    Past Surgical History:  Procedure Laterality Date   BIOPSY  02/02/2017   Procedure: BIOPSY;  Surgeon: Daneil Dolin, MD;  [facility];  Service: Endoscopy;;  duodenal bx's, esophgeal bx's   BIOPSY  07/30/2017   Procedure: BIOPSY;  Surgeon: Daneil Dolin, MD;  [facility];  Service: Endoscopy;;  esophagus   CARDIOVASCULAR STRESS TEST  09/12/2009   normal nuclear perfusion study w/ no ischemia /  normal LV function and wall motion , ef 57%   COLONOSCOPY  2007   Dr. Gala Romney: hyperplastic polyps, internal hemorrhoids    COLONOSCOPY WITH PROPOFOL N/A 05/03/2018   Dr. Gala Romney: diverticulosis in entire colon, multiple polyps in sigmoid, at splenic flexure, and ascending colon, not all polyps removed. Tubular adenomas and inflammatory polyps. Needs 3 year surveilance   EPIDURAL BLOCK INJECTION  2020   ESOPHAGOGASTRODUODENOSCOPY (EGD) WITH PROPOFOL N/A 02/02/2017   Dr. Gala Romney: esophageal stenosis s/p dilation and biopsy, medium sized hiatal hernia, normal duodenum   ESOPHAGOGASTRODUODENOSCOPY (EGD) WITH PROPOFOL N/A 07/30/2017   Dr. Gala Romney: esophageal stenosis s/p dilation and biopsy, medium-sized hiatal hernia, normal duodenum   HEMORRHOID SURGERY  01-21-2008    [place] N/A 02/02/2017   Procedure: Venia Minks DILATION;  Surgeon:  Daneil Dolin, MD;  [facility];  Service: Endoscopy;  Laterality: N/A;   MALONEY DILATION N/A 07/30/2017   Procedure: Venia Minks DILATION;  Surgeon: Daneil Dolin, MD;  [facility];  Service: Endoscopy;  Laterality: N/A;   POLYPECTOMY  05/03/2018   Procedure: POLYPECTOMY;  Surgeon: Daneil Dolin, MD;  [facility];  Service: Endoscopy;;  ascending colon polyp, sigmoid colon polyps (multiple)   RADIOACTIVE SEED IMPLANT N/A 04/23/2017   Procedure: RADIOACTIVE SEED IMPLANT/BRACHYTHERAPY IMPLANT;  Surgeon: Franchot Gallo, MD;  Location: [facility];  Service: Urology;  Laterality: N/A;   SHOULDER ARTHROSCOPY  08/05/2003   SPACE OAR INSTILLATION N/A 04/23/2017   Procedure: SPACE OAR INSTILLATION;  Surgeon: Franchot Gallo, MD;  Location: [facility];  Service: Urology;  Laterality: N/A;   TRANSTHORACIC ECHOCARDIOGRAM  03/28/2009   mild LVH, ef 55-60%/ mild aorta calcification   VASECTOMY  08/05/1983   VIDEO ASSISTED THORACOSCOPY (VATS)/DECORTICATION Left     Current Outpatient Medications  Medication Sig Dispense Refill   albuterol (PROVENTIL HFA;VENTOLIN HFA) 108 (90 Base) MCG/ACT inhaler Inhale 2 puffs into the lungs every 4 (four) hours as needed for wheezing or shortness of breath. 1 Inhaler 5   albuterol (PROVENTIL) (2.5 MG/3ML) 0.083% nebulizer solution Take 3 mLs (2.5 mg total) by nebulization every 6 (six) hours as needed for wheezing or shortness of breath. 300 mL 5   albuterol (PROVENTIL) (2.5 MG/3ML) 0.083% nebulizer solution albuterol sulfate 2.5 mg/3 mL (0.083 %) solution for nebulization     cefUROXime (CEFTIN) 500 MG tablet Take 500 mg by mouth 2 (two) times daily with a meal. For 10 days     Fluticasone-Umeclidin-Vilant (TRELEGY ELLIPTA) 100-62.5-25 MCG/INH AEPB Inhale 1 puff into the lungs daily. 60 each 6   gabapentin (NEURONTIN) 300 MG capsule Take 300 mg by mouth 3 (three) times daily.      pantoprazole (PROTONIX) 40 MG tablet Take 40 mg by mouth daily.      predniSONE (DELTASONE) 10 MG tablet Take 4 tabs by mouth once daily x4 days, then 3 tabs x4 days, 2 tabs x4 days, 1 tab x4 days and stop. (Patient taking differently: Take 2 tablets by mouth twice daily for 3 days, then 1 tablet twice daily for 3 days, then 1 tablet daily thereafter.) 40 tablet 0   rivaroxaban (XARELTO) 20 MG TABS tablet Take 20 mg by mouth daily with supper.     No current facility-administered medications for this visit.     Allergies as of 01/05/2019   (No Known Allergies)    Vitals: There were no vitals taken for this visit. Last Weight:  Wt Readings from Last 1 Encounters:  01/04/19 230 lb (104.3 kg)   Last Height:   Ht Readings from Last 1 Encounters:  01/04/19 6\' 2"  (1.88 m)   Physical exam: Exam: Gen: NAD, conversant      CV: attempted, Could not perform over Web Video. Denies palpitations or chest pain or SOB. VS: Breathing at a normal rate. Weight appears obese. Not febrile. Eyes: Conjunctivae clear without exudates or hemorrhage  Neuro: Detailed Neurologic Exam  Speech:    Speech is normal; fluent and spontaneous with normal comprehension.  Cognition:    The patient is oriented to person, place, and time;     recent and remote memory intact;     language fluent;     normal attention, concentration,     fund of knowledge Cranial Nerves:    The pupils are equal, round, and reactive to light. Attempted,  Cannot perform fundoscopic exam. Visual fields are full to finger confrontation. Extraocular movements are intact.  The face is symmetric with normal sensation. The palate elevates in the midline. Hearing intact. Voice is normal. Shoulder shrug is normal. The tongue has normal motion without fasciculations.   Coordination:    Normal finger to nose  Gait:    Normal native gait  Motor Observation:   no involuntary movements noted. Tone:    Appears normal  Posture:     Posture is normal. normal erect    Strength:    Strength is anti-gravity and symmetric in the upper and lower limbs.      Sensation: intact to LT     Reflex Exam:  DTR's:    Attempted, Could not perform over Web Video   Toes: Attempted Could not perform over Web Video  Clonus:   Attempted, Could not perform over Web Video     Assessment/Plan:  VANDELL KUN is a 63 y.o. male here as requested by Dr. Tonita Cong for lumbar radiculopathy. PMHx chronic low back pain.  It appears he is on gabapentin. Recent MRi showed AVN and patient scheduled for hip surgery. But also degenerative changes in the lumbar spine. Patient has a history of chronic low back pain.  -Patient was referred to me because of radicular symptoms and degenerative changes and possibly lumbar radiculopathy.  However lumbosacral epidural steroid injections did not improve his pain.  Also the pain distribution was radiating into the groin area from the hip which did not match the distribution of L5-S1 thought to be causing radicular symptoms.  Since patient sent here MRI of his hip (I do not have report, this is per note review) showed avascular necrosis.  Patient has been referred for hip replacement.  I recommend at this time he goes forward with a hip replacement due to the avascular necrosis and symptoms that appear consistent with this diagnosis.  If patient continues to have radicular symptoms after recovery we can schedule an EMG nerve conduction study to assess whether it is lumbar radiculopathy or sciatic neuropathy caused by avascular necrosis.  -I have called Dr. Tonita Cong just to ensure that he does not want an EMG nerve conduction study prior to hip replacement however I am not sure those results would change any management at this point since patient has avascular necrosis he likely needs a surgery regardless.   Follow Up Instructions:    I discussed the assessment and treatment plan with the patient. The patient was  provided an opportunity to ask questions and all were answered. The patient agreed with the plan and demonstrated an understanding of the instructions.   The patient was advised to call back or seek an in-person evaluation if the symptoms worsen or if the condition fails to improve as anticipated.   Cc: [name], MD,  Dr. Tonita Cong [facility]  Sarina Ill, MD  [facility] [address] [place], [place] [address]  Phone [phone] Fax [phone]

## 2019-01-06 ENCOUNTER — Ambulatory Visit: Payer: 59 | Admitting: Pulmonary Disease

## 2019-01-20 ENCOUNTER — Other Ambulatory Visit: Payer: Self-pay

## 2019-01-20 MED ORDER — TRELEGY ELLIPTA 100-62.5-25 MCG/INH IN AEPB: 1.0000 | INHALATION_SPRAY | Freq: Every day | RESPIRATORY_TRACT | 6 refills | Status: DC

## 2019-02-07 DIAGNOSIS — Z96649 Presence of unspecified artificial hip joint: Secondary | ICD-10-CM | POA: Insufficient documentation

## 2019-02-08 ENCOUNTER — Ambulatory Visit: Payer: 59 | Admitting: Pulmonary Disease

## 2019-02-11 ENCOUNTER — Other Ambulatory Visit (HOSPITAL_COMMUNITY): Payer: Self-pay | Admitting: Nurse Practitioner

## 2019-02-11 DIAGNOSIS — I82431 Acute embolism and thrombosis of right popliteal vein: Secondary | ICD-10-CM

## 2019-02-14 ENCOUNTER — Inpatient Hospital Stay (HOSPITAL_COMMUNITY): Payer: 59 | Attending: Hematology

## 2019-02-14 ENCOUNTER — Other Ambulatory Visit: Payer: Self-pay

## 2019-02-14 DIAGNOSIS — D6851 Activated protein C resistance: Secondary | ICD-10-CM | POA: Insufficient documentation

## 2019-02-14 DIAGNOSIS — Z87891 Personal history of nicotine dependence: Secondary | ICD-10-CM | POA: Insufficient documentation

## 2019-02-14 DIAGNOSIS — Z7289 Other problems related to lifestyle: Secondary | ICD-10-CM | POA: Insufficient documentation

## 2019-02-14 DIAGNOSIS — Z79899 Other long term (current) drug therapy: Secondary | ICD-10-CM | POA: Diagnosis not present

## 2019-02-14 DIAGNOSIS — C61 Malignant neoplasm of prostate: Secondary | ICD-10-CM | POA: Diagnosis not present

## 2019-02-14 DIAGNOSIS — M2550 Pain in unspecified joint: Secondary | ICD-10-CM | POA: Diagnosis not present

## 2019-02-14 DIAGNOSIS — Z7901 Long term (current) use of anticoagulants: Secondary | ICD-10-CM | POA: Diagnosis not present

## 2019-02-14 DIAGNOSIS — I82431 Acute embolism and thrombosis of right popliteal vein: Secondary | ICD-10-CM

## 2019-02-14 DIAGNOSIS — Z86718 Personal history of other venous thrombosis and embolism: Secondary | ICD-10-CM | POA: Diagnosis not present

## 2019-02-14 LAB — CBC WITH DIFFERENTIAL/PLATELET
Abs Immature Granulocytes: 0.07 10*3/uL (ref 0.00–0.07)
Basophils Absolute: 0 10*3/uL (ref 0.0–0.1)
Basophils Relative: 1 %
Eosinophils Absolute: 0.2 10*3/uL (ref 0.0–0.5)
Eosinophils Relative: 2 %
HCT: 44 % (ref 39.0–52.0)
Hemoglobin: 14.4 g/dL (ref 13.0–17.0)
Immature Granulocytes: 1 %
Lymphocytes Relative: 25 %
Lymphs Abs: 1.7 10*3/uL (ref 0.7–4.0)
MCH: 30 pg (ref 26.0–34.0)
MCHC: 32.7 g/dL (ref 30.0–36.0)
MCV: 91.7 fL (ref 80.0–100.0)
Monocytes Absolute: 1.1 10*3/uL — ABNORMAL HIGH (ref 0.1–1.0)
Monocytes Relative: 16 %
Neutro Abs: 3.7 10*3/uL (ref 1.7–7.7)
Neutrophils Relative %: 55 %
Platelets: 242 10*3/uL (ref 150–400)
RBC: 4.8 MIL/uL (ref 4.22–5.81)
RDW: 12.9 % (ref 11.5–15.5)
WBC: 6.8 10*3/uL (ref 4.0–10.5)
nRBC: 0 % (ref 0.0–0.2)

## 2019-02-14 LAB — D-DIMER, QUANTITATIVE: D-Dimer, Quant: 2.32 ug/mL-FEU — ABNORMAL HIGH (ref 0.00–0.50)

## 2019-02-14 LAB — COMPREHENSIVE METABOLIC PANEL
ALT: 20 U/L (ref 0–44)
AST: 27 U/L (ref 15–41)
Albumin: 3.9 g/dL (ref 3.5–5.0)
Alkaline Phosphatase: 52 U/L (ref 38–126)
Anion gap: 11 (ref 5–15)
BUN: 17 mg/dL (ref 8–23)
CO2: 27 mmol/L (ref 22–32)
Calcium: 9.9 mg/dL (ref 8.9–10.3)
Chloride: 102 mmol/L (ref 98–111)
Creatinine, Ser: 0.95 mg/dL (ref 0.61–1.24)
GFR calc Af Amer: >60 mL/min (ref >60)
GFR calc non Af Amer: >60 mL/min (ref >60)
Glucose, Bld: 95 mg/dL (ref 70–99)
Potassium: 4.3 mmol/L (ref 3.5–5.1)
Sodium: 140 mmol/L (ref 135–145)
Total Bilirubin: 0.7 mg/dL (ref 0.3–1.2)
Total Protein: 7.5 g/dL (ref 6.5–8.1)

## 2019-02-21 ENCOUNTER — Encounter (HOSPITAL_COMMUNITY): Payer: Self-pay | Admitting: Hematology

## 2019-02-21 ENCOUNTER — Inpatient Hospital Stay (HOSPITAL_BASED_OUTPATIENT_CLINIC_OR_DEPARTMENT_OTHER): Payer: 59 | Admitting: Hematology

## 2019-02-21 ENCOUNTER — Other Ambulatory Visit: Payer: Self-pay

## 2019-02-21 VITALS — BP 129/79 | HR 88 | Temp 98.5°F | Resp 16 | Wt 218.7 lb

## 2019-02-21 DIAGNOSIS — D6851 Activated protein C resistance: Secondary | ICD-10-CM

## 2019-02-21 DIAGNOSIS — Z86718 Personal history of other venous thrombosis and embolism: Secondary | ICD-10-CM

## 2019-02-21 DIAGNOSIS — M2550 Pain in unspecified joint: Secondary | ICD-10-CM

## 2019-02-21 DIAGNOSIS — C61 Malignant neoplasm of prostate: Secondary | ICD-10-CM

## 2019-02-21 DIAGNOSIS — Z87891 Personal history of nicotine dependence: Secondary | ICD-10-CM

## 2019-02-21 DIAGNOSIS — Z7901 Long term (current) use of anticoagulants: Secondary | ICD-10-CM

## 2019-02-21 DIAGNOSIS — Z79899 Other long term (current) drug therapy: Secondary | ICD-10-CM

## 2019-02-21 DIAGNOSIS — Z7289 Other problems related to lifestyle: Secondary | ICD-10-CM

## 2019-02-21 DIAGNOSIS — I82431 Acute embolism and thrombosis of right popliteal vein: Secondary | ICD-10-CM

## 2019-02-21 NOTE — Assessment & Plan Note (Signed)
1.  Right leg DVT (weakly provoked): - Patient had occlusive DVT in the right popliteal and peroneal veins on 05/04/2018 after a 4-hour flight 4 days prior to presentation. - He was heterozygous for factor V Leiden mutation.  Lupus anticoagulant test was positive and he was on Xarelto.  Repeat lupus anticoagulant was negative off anticoagulation. - Treated with Xarelto from October 2019 through February 2020.  Last Doppler on 11/05/2018 was negative for DVT. - Patient had left hip replacement on 02/07/2019.  He finished Xarelto for 2 weeks today. - D-dimer is elevated at 2.32.  This is postsurgical. - Right leg is slightly bigger than the left leg, which could be from prior clot.  He is also right-handed. - He is doing ankle pumping exercises 15 minutes every hour. - No further testing needed today.  He will come back in 2 months for follow-up.  2.  Prostate cancer: -Seed implantation and brachytherapy in October 2018. -He follows up with Dr. Meda Coffee.  3.  Smoking history: -PET scan on 03/23/2018 following CT chest on 03/18/2018. -CT chest LCS protocol on 07/20/2018 shows lung RADS 2S. - We will plan to repeat low-dose CT in December this year.

## 2019-02-21 NOTE — Patient Instructions (Signed)
[facility] at [facility] Discharge Instructions  You were seen today by Dr. Katragadda. He went over your recent lab results. He will see you back in 3 months for labs and follow up.   Thank you for choosing [facility] at [facility] to provide your oncology and hematology care.  To afford each patient quality time with our provider, please arrive at least 15 minutes before your scheduled appointment time.   If you have a lab appointment with the Cancer Center please come in thru the  Main Entrance and check in at the main information desk  You need to re-schedule your appointment should you arrive 10 or more minutes late.  We strive to give you quality time with our providers, and arriving late affects you and other patients whose appointments are after yours.  Also, if you no show three or more times for appointments you may be dismissed from the clinic at the providers discretion.     Again, thank you for choosing [facility].  Our hope is that these requests will decrease the amount of time that you wait before being seen by our physicians.       _____________________________________________________________  Should you have questions after your visit to [facility], please contact our office at [phone] between the hours of 8:00 a.m. and 4:30 p.m.  Voicemails left after 4:00 p.m. will not be returned until the following business day.  For prescription refill requests, have your pharmacy contact our office and allow 72 hours.    Cancer Center Support Programs:   > Cancer Support Group  2nd Tuesday of the month 1pm-2pm, Journey Room    

## 2019-02-21 NOTE — Progress Notes (Signed)
[address], [place] [address]   CLINIC:  Medical Oncology/Hematology  PCP:  [name], MD [place] [id number] [phone]   REASON FOR VISIT:  Follow-up for right leg DVT  CURRENT THERAPY:  None.     INTERVAL HISTORY:  [name] 63 y.o. male seen for follow-up of right leg DVT.  He finished taking Xarelto in February of this year.  He had a hip replacement done 2 weeks ago.  He was placed on prophylactic Xarelto which he finished today.  He has some left hip pain while getting up secondary to surgery.  Otherwise he is being very active and walks around the house during the daytime.  He is doing ankle pumping exercises for 15 minutes every hour.  Appetite is 100%.  Pain is 4 out of 10 in the left hip region.  No swelling of the legs reported.  REVIEW OF SYSTEMS:  Review of Systems  Musculoskeletal: Positive for arthralgias.  All other systems reviewed and are negative.    PAST MEDICAL/SURGICAL HISTORY:  Past Medical History:  Diagnosis Date  . Bilateral shoulder bursitis   . Chronic back pain   . COPD (chronic obstructive pulmonary disease) with emphysema ([place])   . DDD (degenerative disc disease), lumbar    hx epideral injection  L5--S1   . DVT (deep venous thrombosis) ([place]) 11/19/2018  . Dysphasia    intermittant w/ food   . Dyspnea   . Dyspnea on exertion   . GERD (gastroesophageal reflux disease)   . Gout    L great toe  . Hiatal hernia   . Hip problem 2020   left, seeing orthopedics  . History of acute pancreatitis    alcoholic pancreatitis 22-48-2500 and 01-30-2012  . History of colon polyps    10-17-2005  hyperplastic polyp's  . History of esophageal stricture    s/p  dilation 02-02-2017  . History of peptic ulcer 1990s  . History of pneumococcal pneumonia 2006   bilateral pneumonia w/ left lower lobe abscess treated w/ chest tube with suction for drainage  . History of pneumothorax    1984--  left spontaneous pneumothorax ,  treated w/ chest tube  . Hypertension   . Malignant neoplasm of prostate [facility]) urologist-  dr dahlstedt/  oncologist -- dr Tammi Klippel   dx 12-02-2016-- Stage T2b, Gleason 3+4,  PSA 5.99,  vol 25cc  . Numbness of left foot    outer left ankle numb-- per pt has appt. w/ neurologist  . OSA (obstructive sleep apnea)    per pt has not used cpap over year ago from 04-16-2017--- per study 01-17-2010  severe osa  . Solid nodule of lung greater than 8 mm in diameter 05/11/2018   03/2018: RUL Nodule, 59mm  . Wears glasses    Past Surgical History:  Procedure Laterality Date  . BIOPSY  02/02/2017   Procedure: BIOPSY;  Surgeon: Daneil Dolin, MD;  [facility];  Service: Endoscopy;;  duodenal bx's, esophgeal bx's  . BIOPSY  07/30/2017   Procedure: BIOPSY;  Surgeon: Daneil Dolin, MD;  [facility];  Service: Endoscopy;;  esophagus  . CARDIOVASCULAR STRESS TEST  09/12/2009   normal nuclear perfusion study w/ no ischemia /  normal LV function and wall motion , ef 57%  . COLONOSCOPY  2007   Dr. Gala Romney: hyperplastic polyps, internal hemorrhoids   . COLONOSCOPY WITH PROPOFOL N/A 05/03/2018   Dr. Gala Romney:  diverticulosis in entire colon, multiple polyps in sigmoid, at splenic flexure, and ascending colon, not all polyps removed. Tubular adenomas and inflammatory polyps. Needs 3 year surveilance  . EPIDURAL BLOCK INJECTION  2020  . ESOPHAGOGASTRODUODENOSCOPY (EGD) WITH PROPOFOL N/A 02/02/2017   Dr. Gala Romney: esophageal stenosis s/p dilation and biopsy, medium sized hiatal hernia, normal duodenum  . ESOPHAGOGASTRODUODENOSCOPY (EGD) WITH PROPOFOL N/A 07/30/2017   Dr. Gala Romney: esophageal stenosis s/p dilation and biopsy, medium-sized hiatal hernia, normal duodenum  . HEMORRHOID SURGERY  01-21-2008    [facility]  . MALONEY DILATION N/A 02/02/2017   Procedure: Venia Minks DILATION;  Surgeon: Daneil Dolin, MD;  [facility];  Service: Endoscopy;   Laterality: N/A;  . Venia Minks DILATION N/A 07/30/2017   Procedure: Venia Minks DILATION;  Surgeon: Daneil Dolin, MD;  [facility];  Service: Endoscopy;  Laterality: N/A;  . POLYPECTOMY  05/03/2018   Procedure: POLYPECTOMY;  Surgeon: Daneil Dolin, MD;  [facility];  Service: Endoscopy;;  ascending colon polyp, sigmoid colon polyps (multiple)  . RADIOACTIVE SEED IMPLANT N/A 04/23/2017   Procedure: RADIOACTIVE SEED IMPLANT/BRACHYTHERAPY IMPLANT;  Surgeon: Franchot Gallo, MD;  Location: [facility];  Service: Urology;  Laterality: N/A;  . SHOULDER ARTHROSCOPY  08/05/2003  . SPACE OAR INSTILLATION N/A 04/23/2017   Procedure: SPACE OAR INSTILLATION;  Surgeon: Franchot Gallo, MD;  Location: [facility];  Service: Urology;  Laterality: N/A;  . TRANSTHORACIC ECHOCARDIOGRAM  03/28/2009   mild LVH, ef 55-60%/ mild aorta calcification  . VASECTOMY  08/05/1983  . VIDEO ASSISTED THORACOSCOPY (VATS)/DECORTICATION Left      SOCIAL HISTORY:  Social History   Socioeconomic History  . Marital status: Married    Spouse name: Not on file  . Number of children: 2  . Years of education: Not on file  . Highest education level: Bachelor's degree (e.g., BA, AB, BS)  Occupational History  . Not on file  Social Needs  . Financial resource strain: Not on file  . Food insecurity    Worry: Not on file    Inability: Not on file  . Transportation needs    Medical: Not on file    Non-medical: Not on file  Tobacco Use  . Smoking status: Former Smoker    Packs/day: 1.00    Years: 30.00    Pack years: 30.00    Types: Cigarettes, E-cigarettes    Quit date: 10/03/2015    Years since quitting: 3.3  . Smokeless tobacco: Never Used  . Tobacco comment: stoped vaping 2018  Substance and Sexual Activity  . Alcohol use: Yes    Alcohol/week: 21.0 standard drinks    Types: 21 Shots of liquor per week    Comment: 2-3 drinks daily (Wine); 01/04/19 pt states he  hasn't had anything to drink in 2-3 months  . Drug use: No  . Sexual activity: Yes    Birth control/protection: None  Lifestyle  . Physical activity    Days per week: Not on file    Minutes per session: Not on file  . Stress: Not on file  Relationships  . Social Herbalist on phone: Not on file    Gets together: Not on file    Attends religious service: Not on file    Active member of club or organization: Not on file    Attends meetings of clubs or organizations: Not on file    Relationship status: Not on file  . Intimate partner  violence    Fear of current or ex partner: Not on file    Emotionally abused: Not on file    Physically abused: Not on file    Forced sexual activity: Not on file  Other Topics Concern  . Not on file  Social History Narrative   Lives at home with his wife   Right handed    FAMILY HISTORY:  Family History  Problem Relation Age of Onset  . Diabetes Mother   . Heart disease Father   . Heart disease Brother   . Cancer Neg Hx   . Colon cancer Neg Hx   . Colon polyps Neg Hx   . Neuropathy Neg Hx     CURRENT MEDICATIONS:  Outpatient Encounter Medications as of 02/21/2019  Medication Sig  . albuterol (PROVENTIL HFA;VENTOLIN HFA) 108 (90 Base) MCG/ACT inhaler Inhale 2 puffs into the lungs every 4 (four) hours as needed for wheezing or shortness of breath.  Marland Kitchen albuterol (PROVENTIL) (2.5 MG/3ML) 0.083% nebulizer solution Take 3 mLs (2.5 mg total) by nebulization every 6 (six) hours as needed for wheezing or shortness of breath.  Marland Kitchen albuterol (PROVENTIL) (2.5 MG/3ML) 0.083% nebulizer solution albuterol sulfate 2.5 mg/3 mL (0.083 %) solution for nebulization  . Fluticasone-Umeclidin-Vilant (TRELEGY ELLIPTA) 100-62.5-25 MCG/INH AEPB Inhale 1 puff into the lungs daily.  . pantoprazole (PROTONIX) 40 MG tablet Take 40 mg by mouth daily.   . rivaroxaban (XARELTO) 20 MG TABS tablet Take 20 mg by mouth daily with supper.  . [DISCONTINUED] cefUROXime  (CEFTIN) 500 MG tablet Take 500 mg by mouth 2 (two) times daily with a meal. For 10 days  . [DISCONTINUED] gabapentin (NEURONTIN) 300 MG capsule Take 300 mg by mouth 3 (three) times daily.  . [DISCONTINUED] predniSONE (DELTASONE) 10 MG tablet Take 4 tabs by mouth once daily x4 days, then 3 tabs x4 days, 2 tabs x4 days, 1 tab x4 days and stop. (Patient taking differently: Take 2 tablets by mouth twice daily for 3 days, then 1 tablet twice daily for 3 days, then 1 tablet daily thereafter.)   No facility-administered encounter medications on file as of 02/21/2019.     ALLERGIES:  No Known Allergies   PHYSICAL EXAM:  ECOG Performance status: 0  Vitals:   02/21/19 1035  BP: 129/79  Pulse: 88  Resp: 16  Temp: 98.5 F (36.9 C)  SpO2: 96%   Filed Weights   02/21/19 1035  Weight: 218 lb 11.2 oz (99.2 kg)    Physical Exam Vitals signs reviewed.  Constitutional:      Appearance: Normal appearance.  Cardiovascular:     Rate and Rhythm: Normal rate and regular rhythm.     Heart sounds: Normal heart sounds.  Pulmonary:     Effort: Pulmonary effort is normal.     Breath sounds: Normal breath sounds.  Abdominal:     General: There is no distension.     Palpations: Abdomen is soft.     Tenderness: There is no abdominal tenderness.  Musculoskeletal:        General: No swelling.  Skin:    General: Skin is warm.  Neurological:     General: No focal deficit present.     Mental Status: He is alert and oriented to person, place, and time.  Psychiatric:        Mood and Affect: Mood normal.        Behavior: Behavior normal.      LABORATORY DATA:  I have reviewed the labs  as listed.  CBC    Component Value Date/Time   WBC 6.8 02/14/2019 1030   RBC 4.80 02/14/2019 1030   HGB 14.4 02/14/2019 1030   HCT 44.0 02/14/2019 1030   PLT 242 02/14/2019 1030   MCV 91.7 02/14/2019 1030   MCH 30.0 02/14/2019 1030   MCHC 32.7 02/14/2019 1030   RDW 12.9 02/14/2019 1030   LYMPHSABS 1.7  02/14/2019 1030   MONOABS 1.1 (H) 02/14/2019 1030   EOSABS 0.2 02/14/2019 1030   BASOSABS 0.0 02/14/2019 1030   CMP Latest Ref Rng & Units 02/14/2019 09/25/2018 04/23/2018  Glucose 70 - 99 mg/dL 95 178(H) 127(H)  BUN 8 - 23 mg/dL 17 16 15   Creatinine 0.61 - 1.24 mg/dL 0.95 1.01 1.39(H)  Sodium 135 - 145 mmol/L 140 142 141  Potassium 3.5 - 5.1 mmol/L 4.3 4.0 4.1  Chloride 98 - 111 mmol/L 102 106 110  CO2 22 - 32 mmol/L 27 23 25   Calcium 8.9 - 10.3 mg/dL 9.9 8.9 8.8(L)  Total Protein 6.5 - 8.1 g/dL 7.5 7.6 -  Total Bilirubin 0.3 - 1.2 mg/dL 0.7 1.0 -  Alkaline Phos 38 - 126 U/L 52 53 -  AST 15 - 41 U/L 27 42(H) -  ALT 0 - 44 U/L 20 39 -       DIAGNOSTIC IMAGING:  I have independently reviewed the scans and discussed with the patient.   I have reviewed Venita Lick LPN's note and agree with the documentation.  I personally performed a face-to-face visit, made revisions and my assessment and plan is as follows.    ASSESSMENT & PLAN:   DVT (deep venous thrombosis) ([place]) 1.  Right leg DVT (weakly provoked): - Patient had occlusive DVT in the right popliteal and peroneal veins on 05/04/2018 after a 4-hour flight 4 days prior to presentation. - He was heterozygous for factor V Leiden mutation.  Lupus anticoagulant test was positive and he was on Xarelto.  Repeat lupus anticoagulant was negative off anticoagulation. - Treated with Xarelto from October 2019 through February 2020.  Last Doppler on 11/05/2018 was negative for DVT. - Patient had left hip replacement on 02/07/2019.  He finished Xarelto for 2 weeks today. - D-dimer is elevated at 2.32.  This is postsurgical. - Right leg is slightly bigger than the left leg, which could be from prior clot.  He is also right-handed. - He is doing ankle pumping exercises 15 minutes every hour. - No further testing needed today.  He will come back in 2 months for follow-up.  2.  Prostate cancer: -Seed implantation and brachytherapy in October  2018. -He follows up with Dr. Meda Coffee.  3.  Smoking history: -PET scan on 03/23/2018 following CT chest on 03/18/2018. -CT chest LCS protocol on 07/20/2018 shows lung RADS 2S. - We will plan to repeat low-dose CT in December this year.   Total time spent is 25 minutes with more than 50% of the time spent face-to-face discussing follow-up plan, counseling and coordination of care.  Orders placed this encounter:  Orders Placed This Encounter  Procedures  . D-dimer, quantitative      Derek Jack, MD [place] [phone]

## 2019-03-01 ENCOUNTER — Ambulatory Visit (INDEPENDENT_AMBULATORY_CARE_PROVIDER_SITE_OTHER): Payer: 59 | Admitting: Urology

## 2019-03-01 DIAGNOSIS — Z8546 Personal history of malignant neoplasm of prostate: Secondary | ICD-10-CM | POA: Diagnosis not present

## 2019-03-02 ENCOUNTER — Encounter: Payer: Self-pay | Admitting: Primary Care

## 2019-03-02 ENCOUNTER — Ambulatory Visit (INDEPENDENT_AMBULATORY_CARE_PROVIDER_SITE_OTHER): Payer: 59 | Admitting: Primary Care

## 2019-03-02 ENCOUNTER — Other Ambulatory Visit: Payer: Self-pay

## 2019-03-02 DIAGNOSIS — Z720 Tobacco use: Secondary | ICD-10-CM

## 2019-03-02 DIAGNOSIS — G4733 Obstructive sleep apnea (adult) (pediatric): Secondary | ICD-10-CM

## 2019-03-02 DIAGNOSIS — R933 Abnormal findings on diagnostic imaging of other parts of digestive tract: Secondary | ICD-10-CM

## 2019-03-02 DIAGNOSIS — Z9989 Dependence on other enabling machines and devices: Secondary | ICD-10-CM

## 2019-03-02 DIAGNOSIS — R911 Solitary pulmonary nodule: Secondary | ICD-10-CM

## 2019-03-02 NOTE — Assessment & Plan Note (Signed)
-   Managed with neurology

## 2019-03-02 NOTE — Assessment & Plan Note (Addendum)
-   Stable, no recent exacerbations and rare SABA use  - Continue Trelegy 1 puff daily; prn Albuterol hfa/neb q6 hours  - FU in 3-4 months

## 2019-03-02 NOTE — Assessment & Plan Note (Signed)
-   Underwent C-scope by GI in September 2019 revealing multiple polyps, tubular adenomas. No cancer detected and colonic diverticulosis

## 2019-03-02 NOTE — Progress Notes (Signed)
@Patient  ID: [name], male    DOB: October 07, 1955, 63 y.o.   MRN: [id number]  Chief Complaint  Patient presents with  . Follow-up    Patient states that his breathing is stable at this time and has not had to use his rescue inhaler.    Referring provider: [name], MD  HPI: 63 year old male, former smoker. PMH significant for COPD, OSA on CPAP, lung nodules, GERD, DVT,  pancreatitis, malignant neoplasm prostate, alcoholism. Patient of Dr. Valeta Harms, last seen on 10/07/18. Follows with low dose lung cancer screening. PET for evaluation of RUL lung nodule seen on LDCT. This revealed low level uptake of pulmonary nodule, however, there was abnormal uptake within the colon. Patient underwent C-scope by GI revealing multiple polyps, tubular adenomas, no cancer detected and colonic diverticulosis. Repeat LDCT on 07/20/18 showed stable RUL nodule/ Lung RADS2. Maintained on Trelegy.   03/02/2019 Patient presents today for a 3 month follow-up. He feels well, no acute complaints. His breathing has been stable, has not needed to use his rescue inhaler. Will use rescue inhaler 1-2 times when playing golf. He has not been very active recently d/t hip replacement surgery. He has a very occasional cough, once a day with phelgm (light discoloration). This has greatly improved since quitting smoking. Receiving home physical therapy. Left hip pain is manageable with tylenol. He completed xarelto treatment for DVT back in March and was treat for 2 weeks after surgery. No calf pain or lower leg swelling. He is not currently working, receiving severance from his job. He is looking into possibly getting social security and/or medicare early d/t his limitation from COPD and hip surgery. Given number for [employer].   Imaging: 07/21/18 LDCT- Lung RADS 2s, continue annual screening in 12 months. Mild diffuse bronchial wall thickening with mild centrilobular and paraseptal emphysema. Hepatic  steatosis.   03/24/18 PET scan- RUL irregular density is not significantly hypermetabolic, follow up LDCT in 3 months . Extensive colonic diverticulosis with wall thickening of and hypermetabolism within the sigmoid. Favor mild diverticulitis. An underlying neoplasm cannot be excluded. Consider further evaluation with colonoscopy  03/20/19 LDCT- Irregular nodular density within the right upper lobe, diameter 9.58mm (Lung RADS 4a)   No Known Allergies  Immunization History  Administered Date(s) Administered  . Tdap 08/22/2016    Past Medical History:  Diagnosis Date  . Bilateral shoulder bursitis   . Chronic back pain   . COPD (chronic obstructive pulmonary disease) with emphysema ([place])   . DDD (degenerative disc disease), lumbar    hx epideral injection  L5--S1   . DVT (deep venous thrombosis) ([place]) 11/19/2018  . Dysphasia    intermittant w/ food   . Dyspnea   . Dyspnea on exertion   . GERD (gastroesophageal reflux disease)   . Gout    L great toe  . Hiatal hernia   . Hip problem 2020   left, seeing orthopedics  . History of acute pancreatitis    alcoholic pancreatitis [id number] and 01-30-2012  . History of colon polyps    10-17-2005  hyperplastic polyp's  . History of esophageal stricture    s/p  dilation 02-02-2017  . History of peptic ulcer 1990s  . History of pneumococcal pneumonia 2006   bilateral pneumonia w/ left lower lobe abscess treated w/ chest tube with suction for drainage  . History of pneumothorax    1984--  left spontaneous pneumothorax ,  treated w/ chest tube  . Hypertension   .  Malignant neoplasm of prostate [facility]) urologist-  dr dahlstedt/  oncologist -- dr Tammi Klippel   dx 12-02-2016-- Stage T2b, Gleason 3+4,  PSA 5.99,  vol 25cc  . Numbness of left foot    outer left ankle numb-- per pt has appt. w/ neurologist  . OSA (obstructive sleep apnea)    per pt has not used cpap over year ago from 04-16-2017--- per study 01-17-2010  severe osa  . Solid nodule  of lung greater than 8 mm in diameter 05/11/2018   03/2018: RUL Nodule, 79mm  . Wears glasses     Tobacco History: Social History   Tobacco Use  Smoking Status Former Smoker  . Packs/day: 1.00  . Years: 30.00  . Pack years: 30.00  . Types: Cigarettes, E-cigarettes  . Quit date: 10/03/2015  . Years since quitting: 3.4  Smokeless Tobacco Never Used  Tobacco Comment   stoped vaping 2018   Counseling given: Not Answered Comment: stoped vaping 2018   Outpatient Medications Prior to Visit  Medication Sig Dispense Refill  . acetaminophen (TYLENOL) 500 MG tablet Take 500 mg by mouth every 6 (six) hours as needed.    Marland Kitchen albuterol (PROVENTIL HFA;VENTOLIN HFA) 108 (90 Base) MCG/ACT inhaler Inhale 2 puffs into the lungs every 4 (four) hours as needed for wheezing or shortness of breath. 1 Inhaler 5  . albuterol (PROVENTIL) (2.5 MG/3ML) 0.083% nebulizer solution Take 3 mLs (2.5 mg total) by nebulization every 6 (six) hours as needed for wheezing or shortness of breath. 300 mL 5  . Fluticasone-Umeclidin-Vilant (TRELEGY ELLIPTA) 100-62.5-25 MCG/INH AEPB Inhale 1 puff into the lungs daily. 60 each 6  . pantoprazole (PROTONIX) 40 MG tablet Take 40 mg by mouth daily.     Marland Kitchen albuterol (PROVENTIL) (2.5 MG/3ML) 0.083% nebulizer solution albuterol sulfate 2.5 mg/3 mL (0.083 %) solution for nebulization    . rivaroxaban (XARELTO) 20 MG TABS tablet Take 20 mg by mouth daily with supper.     No facility-administered medications prior to visit.    Review of Systems  Review of Systems  Constitutional: Negative.   HENT: Negative.   Respiratory: Positive for cough. Negative for shortness of breath and wheezing.   Cardiovascular: Negative.   Musculoskeletal:       Left hip pain   Physical Exam  BP 122/70 (BP Location: Left Arm, Cuff Size: Normal)   Pulse 78   Temp 98.2 F (36.8 C) (Oral)   Ht 6\' 1"  (1.854 m)   Wt 221 lb 3.2 oz (100.3 kg)   SpO2 95%   BMI 29.18 kg/m  Physical Exam  Constitutional:      Appearance: Normal appearance. He is not ill-appearing.  HENT:     Head: Normocephalic and atraumatic.  Cardiovascular:     Rate and Rhythm: Normal rate and regular rhythm.     Comments: No edema Pulmonary:     Effort: Pulmonary effort is normal.     Breath sounds: Normal breath sounds. No wheezing or rhonchi.     Comments: CTA Musculoskeletal:        General: No swelling or deformity.     Right lower leg: No edema.     Left lower leg: No edema.     Comments: S/p left hip replacement  Skin:    General: Skin is warm and dry.  Neurological:     General: No focal deficit present.     Mental Status: He is alert and oriented to person, place, and time. Mental status is at baseline.  Psychiatric:        Mood and Affect: Mood normal.        Behavior: Behavior normal.        Thought Content: Thought content normal.        Judgment: Judgment normal.      Lab Results:  CBC    Component Value Date/Time   WBC 6.8 02/14/2019 1030   RBC 4.80 02/14/2019 1030   HGB 14.4 02/14/2019 1030   HCT 44.0 02/14/2019 1030   PLT 242 02/14/2019 1030   MCV 91.7 02/14/2019 1030   MCH 30.0 02/14/2019 1030   MCHC 32.7 02/14/2019 1030   RDW 12.9 02/14/2019 1030   LYMPHSABS 1.7 02/14/2019 1030   MONOABS 1.1 (H) 02/14/2019 1030   EOSABS 0.2 02/14/2019 1030   BASOSABS 0.0 02/14/2019 1030    BMET    Component Value Date/Time   NA 140 02/14/2019 1030   K 4.3 02/14/2019 1030   CL 102 02/14/2019 1030   CO2 27 02/14/2019 1030   GLUCOSE 95 02/14/2019 1030   BUN 17 02/14/2019 1030   CREATININE 0.95 02/14/2019 1030   CALCIUM 9.9 02/14/2019 1030   GFRNONAA >60 02/14/2019 1030   GFRAA >60 02/14/2019 1030    BNP No results found for: BNP  ProBNP No results found for: PROBNP  Imaging: No results found.   Assessment & Plan:   COPD (chronic obstructive pulmonary disease) ([place]) - Stable, no recent exacerbations and rare SABA use  - Continue Trelegy 1 puff daily; prn  Albuterol hfa/neb q6 hours  - FU in 3-4 months   Obstructive sleep apnea treated with continuous positive airway pressure (CPAP) - Managed with neurology   Solid nodule of lung greater than 8 mm in diameter - 04/2018 RUL 35mm nodule - PET scan showed low uptake - Repeat LDCT 07/2018 showed stable RUL nodule  - Follow with annual screening due Dec 2020   Abnormal CT scan, colon - Underwent C-scope by GI in September 2019 revealing multiple polyps, tubular adenomas. No cancer detected and colonic diverticulosis   Martyn Ehrich, NP 03/02/2019

## 2019-03-02 NOTE — Patient Instructions (Addendum)
You are due for repeat low dose CT- December 2020   Continue trelegy 1 puff daily Use Albuterol rescue inhaler- 2 puffs every 4-6 hours as needed for shortness breath/wheezing  Call [place]  [phone]  Follow-up: 4 months with Dr. Valeta Harms  Lung cancer screening program with Eric Form due December 2020

## 2019-03-02 NOTE — Assessment & Plan Note (Signed)
-   04/2018 RUL 33mm nodule - PET scan showed low uptake - Repeat LDCT 07/2018 showed stable RUL nodule  - Follow with annual screening due Dec 2020

## 2019-03-03 NOTE — Progress Notes (Signed)
PCCM: Thanks for seeing him.  [facility] 03/03/2019 6:08 PM

## 2019-03-18 ENCOUNTER — Ambulatory Visit (HOSPITAL_COMMUNITY)
Admission: RE | Admit: 2019-03-18 | Discharge: 2019-03-18 | Disposition: A | Discharge status: Home / Self Care | Payer: 59 | Source: Ambulatory Visit | Attending: Nurse Practitioner | Admitting: Nurse Practitioner

## 2019-03-18 ENCOUNTER — Other Ambulatory Visit (HOSPITAL_COMMUNITY): Payer: Self-pay | Admitting: Nurse Practitioner

## 2019-03-18 ENCOUNTER — Other Ambulatory Visit (HOSPITAL_COMMUNITY)
Admission: RE | Admit: 2019-03-18 | Discharge: 2019-03-18 | Disposition: A | Discharge status: Home / Self Care | Payer: 59 | Source: Ambulatory Visit | Attending: Nurse Practitioner | Admitting: Nurse Practitioner

## 2019-03-18 ENCOUNTER — Other Ambulatory Visit: Payer: Self-pay

## 2019-03-18 DIAGNOSIS — R112 Nausea with vomiting, unspecified: Secondary | ICD-10-CM

## 2019-03-18 DIAGNOSIS — R110 Nausea: Secondary | ICD-10-CM | POA: Insufficient documentation

## 2019-03-18 DIAGNOSIS — R1110 Vomiting, unspecified: Secondary | ICD-10-CM | POA: Insufficient documentation

## 2019-03-18 LAB — COMPREHENSIVE METABOLIC PANEL
ALT: 32 U/L (ref 0–44)
AST: 44 U/L — ABNORMAL HIGH (ref 15–41)
Albumin: 4.1 g/dL (ref 3.5–5.0)
Alkaline Phosphatase: 81 U/L (ref 38–126)
Anion gap: 13 (ref 5–15)
BUN: 11 mg/dL (ref 8–23)
CO2: 23 mmol/L (ref 22–32)
Calcium: 9.4 mg/dL (ref 8.9–10.3)
Chloride: 100 mmol/L (ref 98–111)
Creatinine, Ser: 0.81 mg/dL (ref 0.61–1.24)
GFR calc Af Amer: >60 mL/min (ref >60)
GFR calc non Af Amer: >60 mL/min (ref >60)
Glucose, Bld: 118 mg/dL — ABNORMAL HIGH (ref 70–99)
Potassium: 4.1 mmol/L (ref 3.5–5.1)
Sodium: 136 mmol/L (ref 135–145)
Total Bilirubin: 1 mg/dL (ref 0.3–1.2)
Total Protein: 7.3 g/dL (ref 6.5–8.1)

## 2019-03-18 LAB — AMYLASE: Amylase: 66 U/L (ref 28–100)

## 2019-03-18 LAB — LIPASE, BLOOD: Lipase: 19 U/L (ref 11–51)

## 2019-05-16 ENCOUNTER — Other Ambulatory Visit: Payer: Self-pay | Admitting: Gastroenterology

## 2019-05-23 ENCOUNTER — Inpatient Hospital Stay (HOSPITAL_COMMUNITY): Payer: 59 | Attending: Hematology

## 2019-05-23 ENCOUNTER — Other Ambulatory Visit: Payer: Self-pay

## 2019-05-23 DIAGNOSIS — Z8546 Personal history of malignant neoplasm of prostate: Secondary | ICD-10-CM | POA: Insufficient documentation

## 2019-05-23 DIAGNOSIS — I82431 Acute embolism and thrombosis of right popliteal vein: Secondary | ICD-10-CM

## 2019-05-23 DIAGNOSIS — R7989 Other specified abnormal findings of blood chemistry: Secondary | ICD-10-CM | POA: Insufficient documentation

## 2019-05-23 DIAGNOSIS — Z86718 Personal history of other venous thrombosis and embolism: Secondary | ICD-10-CM | POA: Insufficient documentation

## 2019-05-23 DIAGNOSIS — Z87891 Personal history of nicotine dependence: Secondary | ICD-10-CM | POA: Diagnosis not present

## 2019-05-23 LAB — D-DIMER, QUANTITATIVE (NOT AT ARMC): D-Dimer, Quant: 2.31 ug/mL-FEU — ABNORMAL HIGH (ref 0.00–0.50)

## 2019-05-30 ENCOUNTER — Other Ambulatory Visit (HOSPITAL_COMMUNITY): Payer: 59

## 2019-05-30 ENCOUNTER — Telehealth: Payer: Self-pay | Admitting: Pulmonary Disease

## 2019-05-30 ENCOUNTER — Encounter (HOSPITAL_COMMUNITY): Payer: Self-pay | Admitting: Hematology

## 2019-05-30 ENCOUNTER — Other Ambulatory Visit: Payer: Self-pay

## 2019-05-30 ENCOUNTER — Inpatient Hospital Stay (HOSPITAL_BASED_OUTPATIENT_CLINIC_OR_DEPARTMENT_OTHER): Payer: 59 | Admitting: Hematology

## 2019-05-30 VITALS — BP 126/72 | HR 77 | Temp 97.1°F | Resp 16 | Wt 223.3 lb

## 2019-05-30 DIAGNOSIS — Z86718 Personal history of other venous thrombosis and embolism: Secondary | ICD-10-CM | POA: Diagnosis not present

## 2019-05-30 DIAGNOSIS — C61 Malignant neoplasm of prostate: Secondary | ICD-10-CM

## 2019-05-30 DIAGNOSIS — I82431 Acute embolism and thrombosis of right popliteal vein: Secondary | ICD-10-CM | POA: Diagnosis not present

## 2019-05-30 NOTE — Patient Instructions (Addendum)
[place] at [facility] Discharge Instructions  You were seen today by Dr. Delton Coombes. He went over your recent lab results. He will call you with the results of your doppler.   Thank you for choosing [place] at [facility] to provide your oncology and hematology care.  To afford each patient quality time with our provider, please arrive at least 15 minutes before your scheduled appointment time.   If you have a lab appointment with the [facility] please come in thru the  Main Entrance and check in at the main information desk  You need to re-schedule your appointment should you arrive 10 or more minutes late.  We strive to give you quality time with our providers, and arriving late affects you and other patients whose appointments are after yours.  Also, if you no show three or more times for appointments you may be dismissed from the clinic at the providers discretion.     Again, thank you for choosing [facility].  Our hope is that these requests will decrease the amount of time that you wait before being seen by our physicians.       _____________________________________________________________  Should you have questions after your visit to [facility], please contact our office at [phone] between the hours of 8:00 a.m. and 4:30 p.m.  Voicemails left after 4:00 p.m. will not be returned until the following business day.  For prescription refill requests, have your pharmacy contact our office and allow 72 hours.    Cancer Center Support Programs:   > Cancer Support Group  2nd Tuesday of the month 1pm-2pm, Journey Room

## 2019-05-30 NOTE — Assessment & Plan Note (Deleted)
1.  Right leg DVT (weakly provoked): - Patient had occlusive DVT in the right popliteal and peroneal veins on 05/04/2018 after a 4-hour flight 4 days prior to presentation. - He was heterozygous for factor V Leiden mutation.  Lupus anticoagulant test was positive and he was on Xarelto.  Repeat lupus anticoagulant was negative off anticoagulation. - Treated with Xarelto from October 2019 through February 2020.  Last Doppler on 11/05/2018 was negative for DVT. - Patient had left hip replacement on 02/07/2019.  He finished Xarelto. - D-dimer remains elevated at 2.31.   - Right leg is slightly bigger than the left leg, which could be from prior clot.  He is also right-handed. - He is working out daily.  - Due to elevated D-dimer, we will plan to do a BLE doppler study.  2.  Prostate cancer: -Seed implantation and brachytherapy in October 2018. -He follows up with Dr. Meda Coffee.  3.  Smoking history: -PET scan on 03/23/2018 following CT chest on 03/18/2018. -CT chest LCS protocol on 07/20/2018 shows lung RADS 2S. - We will plan to repeat low-dose CT in December this year.

## 2019-05-30 NOTE — Assessment & Plan Note (Signed)
1.  Right leg DVT (weakly provoked): - Patient had occlusive DVT in the right popliteal and peroneal veins on 05/04/2018 after a 4-hour flight 4 days prior to presentation. - He was heterozygous for factor V Leiden mutation.  Lupus anticoagulant test was positive and he was on Xarelto.  Repeat lupus anticoagulant was negative off anticoagulation. - Treated with Xarelto from October 2019 through February 2020.  Last Doppler on 11/05/2018 was negative for DVT. - Patient had left hip replacement on 02/07/2019.  He completed 2 weeks postoperative Xarelto. - D-dimer remains elevated at 2.31.   - Right leg is slightly bigger than the left leg, which could be from prior clot.  He is also right-handed. - He is working out daily.  I have discussed the increased risk of recurrent DVT in the setting of elevated D-dimer. - Due to elevated D-dimer, we will plan to do a BLE doppler study.  I will arrange for a phone follow-up visit and review further recommendations.  2.  Prostate cancer: -Seed implantation and brachytherapy in October 2018. -He follows up with Dr. Meda Coffee.  3.  Smoking history: -PET scan on 03/23/2018 following CT chest on 03/18/2018. -CT chest LCS protocol on 07/20/2018 shows lung RADS 2S. - We will plan to repeat low-dose CT in December this year.

## 2019-05-30 NOTE — Progress Notes (Signed)
[name], [place] [address]   CLINIC:  Medical Oncology/Hematology  PCP:  [name], MD [place] [address] [phone]   REASON FOR VISIT:  Follow-up for right leg DVT  CURRENT THERAPY:  Xarelto     INTERVAL HISTORY:  [name] 63 y.o. male seen for follow-up of right leg DVT.  He finished taking Xarelto in February of this year.  He had a recent hip replacement. He continues to work out daily. He reports mild BLE edema, right greater than right.  No other changes to health history since his last visit.  Denies any fevers, night sweats or weight loss.  Appetite is 100%.  Energy levels are 75%.  REVIEW OF SYSTEMS:  Review of Systems  Musculoskeletal: Positive for arthralgias.  All other systems reviewed and are negative.    PAST MEDICAL/SURGICAL HISTORY:  Past Medical History:  Diagnosis Date  . Bilateral shoulder bursitis   . Chronic back pain   . COPD (chronic obstructive pulmonary disease) with emphysema ([place])   . DDD (degenerative disc disease), lumbar    hx epideral injection  L5--S1   . DVT (deep venous thrombosis) ([place]) 11/19/2018  . Dysphasia    intermittant w/ food   . Dyspnea   . Dyspnea on exertion   . GERD (gastroesophageal reflux disease)   . Gout    L great toe  . Hiatal hernia   . Hip problem 2020   left, seeing orthopedics  . History of acute pancreatitis    alcoholic pancreatitis 99991111 and 01-30-2012  . History of colon polyps    10-17-2005  hyperplastic polyp's  . History of esophageal stricture    s/p  dilation 02-02-2017  . History of peptic ulcer 1990s  . History of pneumococcal pneumonia 2006   bilateral pneumonia w/ left lower lobe abscess treated w/ chest tube with suction for drainage  . History of pneumothorax    1984--  left spontaneous pneumothorax ,  treated w/ chest tube  . Hypertension   . Malignant neoplasm of prostate [facility]) urologist-  dr dahlstedt/   oncologist -- dr Tammi Klippel   dx 12-02-2016-- Stage T2b, Gleason 3+4,  PSA 5.99,  vol 25cc  . Numbness of left foot    outer left ankle numb-- per pt has appt. w/ neurologist  . OSA (obstructive sleep apnea)    per pt has not used cpap over year ago from 04-16-2017--- per study 01-17-2010  severe osa  . Solid nodule of lung greater than 8 mm in diameter 05/11/2018   03/2018: RUL Nodule, 14mm  . Wears glasses    Past Surgical History:  Procedure Laterality Date  . BIOPSY  02/02/2017   Procedure: BIOPSY;  Surgeon: Daneil Dolin, MD;  [facility];  Service: Endoscopy;;  duodenal bx's, esophgeal bx's  . BIOPSY  07/30/2017   Procedure: BIOPSY;  Surgeon: Daneil Dolin, MD;  [facility];  Service: Endoscopy;;  esophagus  . CARDIOVASCULAR STRESS TEST  09/12/2009   normal nuclear perfusion study w/ no ischemia /  normal LV function and wall motion , ef 57%  . COLONOSCOPY  2007   Dr. Gala Romney: hyperplastic polyps, internal hemorrhoids   . COLONOSCOPY WITH PROPOFOL N/A 05/03/2018   Dr. Gala Romney: diverticulosis in entire colon, multiple polyps in sigmoid, at splenic flexure, and ascending colon, not all polyps removed. Tubular adenomas and inflammatory polyps. Needs 3 year surveilance  . EPIDURAL BLOCK INJECTION  2020  . ESOPHAGOGASTRODUODENOSCOPY (EGD) WITH PROPOFOL N/A 02/02/2017   Dr. Gala Romney: esophageal stenosis s/p dilation and biopsy, medium sized hiatal hernia, normal duodenum  . ESOPHAGOGASTRODUODENOSCOPY (EGD) WITH PROPOFOL N/A 07/30/2017   Dr. Gala Romney: esophageal stenosis s/p dilation and biopsy, medium-sized hiatal hernia, normal duodenum  . HEMORRHOID SURGERY  01-21-2008    [facility]  . MALONEY DILATION N/A 02/02/2017   Procedure: Venia Minks DILATION;  Surgeon: Daneil Dolin, MD;  [facility];  Service: Endoscopy;  Laterality: N/A;  . Venia Minks DILATION N/A 07/30/2017   Procedure: Venia Minks DILATION;  Surgeon: Daneil Dolin, MD;  [facility];  Service:  Endoscopy;  Laterality: N/A;  . POLYPECTOMY  05/03/2018   Procedure: POLYPECTOMY;  Surgeon: Daneil Dolin, MD;  [facility];  Service: Endoscopy;;  ascending colon polyp, sigmoid colon polyps (multiple)  . RADIOACTIVE SEED IMPLANT N/A 04/23/2017   Procedure: RADIOACTIVE SEED IMPLANT/BRACHYTHERAPY IMPLANT;  Surgeon: Franchot Gallo, MD;  Location: [facility];  Service: Urology;  Laterality: N/A;  . SHOULDER ARTHROSCOPY  08/05/2003  . SPACE OAR INSTILLATION N/A 04/23/2017   Procedure: SPACE OAR INSTILLATION;  Surgeon: Franchot Gallo, MD;  Location: [facility];  Service: Urology;  Laterality: N/A;  . TRANSTHORACIC ECHOCARDIOGRAM  03/28/2009   mild LVH, ef 55-60%/ mild aorta calcification  . VASECTOMY  08/05/1983  . VIDEO ASSISTED THORACOSCOPY (VATS)/DECORTICATION Left      SOCIAL HISTORY:  Social History   Socioeconomic History  . Marital status: Married    Spouse name: Not on file  . Number of children: 2  . Years of education: Not on file  . Highest education level: Bachelor's degree (e.g., BA, AB, BS)  Occupational History  . Not on file  Social Needs  . Financial resource strain: Not on file  . Food insecurity    Worry: Not on file    Inability: Not on file  . Transportation needs    Medical: Not on file    Non-medical: Not on file  Tobacco Use  . Smoking status: Former Smoker    Packs/day: 1.00    Years: 30.00    Pack years: 30.00    Types: Cigarettes, E-cigarettes    Quit date: 10/03/2015    Years since quitting: 3.6  . Smokeless tobacco: Never Used  . Tobacco comment: stoped vaping 2018  Substance and Sexual Activity  . Alcohol use: Yes    Alcohol/week: 21.0 standard drinks    Types: 21 Shots of liquor per week    Comment: 2-3 drinks daily (Wine); 01/04/19 pt states he hasn't had anything to drink in 2-3 months  . Drug use: No  . Sexual activity: Yes    Birth control/protection: None  Lifestyle  . Physical activity     Days per week: Not on file    Minutes per session: Not on file  . Stress: Not on file  Relationships  . Social Herbalist on phone: Not on file    Gets together: Not on file    Attends religious service: Not on file    Active member of club or organization: Not on file    Attends meetings of clubs or organizations: Not on file    Relationship status: Not on file  . Intimate partner violence    Fear of current or ex partner: Not on file    Emotionally abused: Not on file    Physically abused: Not on file    Forced  sexual activity: Not on file  Other Topics Concern  . Not on file  Social History Narrative   Lives at home with his wife   Right handed    FAMILY HISTORY:  Family History  Problem Relation Age of Onset  . Diabetes Mother   . Heart disease Father   . Heart disease Brother   . Cancer Neg Hx   . Colon cancer Neg Hx   . Colon polyps Neg Hx   . Neuropathy Neg Hx     CURRENT MEDICATIONS:  Outpatient Encounter Medications as of 05/30/2019  Medication Sig  . acetaminophen (TYLENOL) 500 MG tablet Take 500 mg by mouth every 6 (six) hours as needed.  Marland Kitchen albuterol (PROVENTIL HFA;VENTOLIN HFA) 108 (90 Base) MCG/ACT inhaler Inhale 2 puffs into the lungs every 4 (four) hours as needed for wheezing or shortness of breath.  Marland Kitchen albuterol (PROVENTIL) (2.5 MG/3ML) 0.083% nebulizer solution Take 3 mLs (2.5 mg total) by nebulization every 6 (six) hours as needed for wheezing or shortness of breath.  Derrill Memo ON 06/20/2019] amoxicillin (AMOXIL) 500 MG capsule Take 1,000 mg by mouth once. Pt is taking before dental appointment  . doxycycline (VIBRAMYCIN) 50 MG capsule Take 50 mg by mouth daily.  . Fluticasone-Umeclidin-Vilant (TRELEGY ELLIPTA) 100-62.5-25 MCG/INH AEPB Inhale 1 puff into the lungs daily.  . pantoprazole (PROTONIX) 40 MG tablet TAKE 1 TABLET(40 MG) BY MOUTH DAILY 30 MINUTES BEFORE BREAKFAST  . [DISCONTINUED] Fluticasone-Umeclidin-Vilant (TRELEGY ELLIPTA)  100-62.5-25 MCG/INH AEPB Trelegy Ellipta 100 mcg-62.5 mcg-25 mcg powder for inhalation  INL 1 PUFF ITL D  . [DISCONTINUED] ondansetron (ZOFRAN-ODT) 4 MG disintegrating tablet ondansetron 4 mg disintegrating tablet   No facility-administered encounter medications on file as of 05/30/2019.     ALLERGIES:  No Known Allergies   PHYSICAL EXAM:  ECOG Performance status: 0  Vitals:   05/30/19 1138  BP: 126/72  Pulse: 77  Resp: 16  Temp: (!) 97.1 F (36.2 C)  SpO2: 97%   Filed Weights   05/30/19 1138  Weight: 223 lb 4.8 oz (101.3 kg)    Physical Exam Vitals signs reviewed.  Constitutional:      Appearance: Normal appearance.  Cardiovascular:     Rate and Rhythm: Normal rate and regular rhythm.     Heart sounds: Normal heart sounds.  Pulmonary:     Effort: Pulmonary effort is normal.     Breath sounds: Normal breath sounds.  Abdominal:     General: There is no distension.     Palpations: Abdomen is soft.     Tenderness: There is no abdominal tenderness.  Musculoskeletal:        General: No swelling.  Skin:    General: Skin is warm.  Neurological:     General: No focal deficit present.     Mental Status: He is alert and oriented to person, place, and time.  Psychiatric:        Mood and Affect: Mood normal.        Behavior: Behavior normal.      LABORATORY DATA:  I have reviewed the labs as listed.  CBC    Component Value Date/Time   WBC 6.8 02/14/2019 1030   RBC 4.80 02/14/2019 1030   HGB 14.4 02/14/2019 1030   HCT 44.0 02/14/2019 1030   PLT 242 02/14/2019 1030   MCV 91.7 02/14/2019 1030   MCH 30.0 02/14/2019 1030   MCHC 32.7 02/14/2019 1030   RDW 12.9 02/14/2019 1030   LYMPHSABS 1.7 02/14/2019 1030  MONOABS 1.1 (H) 02/14/2019 1030   EOSABS 0.2 02/14/2019 1030   BASOSABS 0.0 02/14/2019 1030   CMP Latest Ref Rng & Units 03/18/2019 02/14/2019 09/25/2018  Glucose 70 - 99 mg/dL 118(H) 95 178(H)  BUN 8 - 23 mg/dL 11 17 16   Creatinine 0.61 - 1.24 mg/dL 0.81  0.95 1.01  Sodium 135 - 145 mmol/L 136 140 142  Potassium 3.5 - 5.1 mmol/L 4.1 4.3 4.0  Chloride 98 - 111 mmol/L 100 102 106  CO2 22 - 32 mmol/L 23 27 23   Calcium 8.9 - 10.3 mg/dL 9.4 9.9 8.9  Total Protein 6.5 - 8.1 g/dL 7.3 7.5 7.6  Total Bilirubin 0.3 - 1.2 mg/dL 1.0 0.7 1.0  Alkaline Phos 38 - 126 U/L 81 52 53  AST 15 - 41 U/L 44(H) 27 42(H)  ALT 0 - 44 U/L 32 20 39       DIAGNOSTIC IMAGING:  I have independently reviewed the scans and discussed with the patient.   I have reviewed Venita Lick LPN's note and agree with the documentation.  I personally performed a face-to-face visit, made revisions and my assessment and plan is as follows.    ASSESSMENT & PLAN:   DVT (deep venous thrombosis) ([place]) 1.  Right leg DVT (weakly provoked): - Patient had occlusive DVT in the right popliteal and peroneal veins on 05/04/2018 after a 4-hour flight 4 days prior to presentation. - He was heterozygous for factor V Leiden mutation.  Lupus anticoagulant test was positive and he was on Xarelto.  Repeat lupus anticoagulant was negative off anticoagulation. - Treated with Xarelto from October 2019 through February 2020.  Last Doppler on 11/05/2018 was negative for DVT. - Patient had left hip replacement on 02/07/2019.  He completed 2 weeks postoperative Xarelto. - D-dimer remains elevated at 2.31.   - Right leg is slightly bigger than the left leg, which could be from prior clot.  He is also right-handed. - He is working out daily.  I have discussed the increased risk of recurrent DVT in the setting of elevated D-dimer. - Due to elevated D-dimer, we will plan to do a BLE doppler study.  I will arrange for a phone follow-up visit and review further recommendations.  2.  Prostate cancer: -Seed implantation and brachytherapy in October 2018. -He follows up with Dr. Meda Coffee.  3.  Smoking history: -PET scan on 03/23/2018 following CT chest on 03/18/2018. -CT chest LCS protocol on 07/20/2018 shows  lung RADS 2S. - We will plan to repeat low-dose CT in December this year.   Total time spent is 25 minutes with more than 50% of the time spent face-to-face discussing follow-up plan, counseling and coordination of care.  Orders placed this encounter:  Orders Placed This Encounter  Procedures  . US Venous Img Lower Bilateral      Derek Jack, MD [place] [phone]

## 2019-05-31 ENCOUNTER — Ambulatory Visit (HOSPITAL_COMMUNITY)
Admission: RE | Admit: 2019-05-31 | Discharge: 2019-05-31 | Disposition: A | Discharge status: Home / Self Care | Payer: 59 | Source: Ambulatory Visit | Attending: Hematology | Admitting: Hematology

## 2019-05-31 DIAGNOSIS — I82431 Acute embolism and thrombosis of right popliteal vein: Secondary | ICD-10-CM | POA: Diagnosis not present

## 2019-05-31 NOTE — Telephone Encounter (Signed)
lmtcb [name] ° °

## 2019-06-01 ENCOUNTER — Encounter (HOSPITAL_COMMUNITY): Payer: Self-pay | Admitting: Hematology

## 2019-06-01 ENCOUNTER — Other Ambulatory Visit: Payer: Self-pay

## 2019-06-01 ENCOUNTER — Inpatient Hospital Stay (HOSPITAL_BASED_OUTPATIENT_CLINIC_OR_DEPARTMENT_OTHER): Payer: 59 | Admitting: Hematology

## 2019-06-01 DIAGNOSIS — I82431 Acute embolism and thrombosis of right popliteal vein: Secondary | ICD-10-CM

## 2019-06-01 NOTE — Telephone Encounter (Signed)
Investigated into patient's chart. Patient is scheduled for 4 month follow up with Dr. Valeta Harms on 06/08/2019.  Patient is due in December 2020 for low dose CT scan for annual lung cancer screening.  Called and spoke to patient and let him to know to keep his upcoming appointment and that he will be contacted about the CT scan.   Will route to [place] [facility] to follow up on for the lung cancer screening program.

## 2019-06-01 NOTE — Telephone Encounter (Signed)
Pt said he was to do a flu chest ct however no order is in for this, he has an appt for 06/08/19  To see dr icard does he need to  Keep this appt  Or when does he need chest ct and the flu ov?

## 2019-06-01 NOTE — Progress Notes (Signed)
Virtual Visit via Telephone Note  I connected with [name] on 06/01/19 at  4:20 PM EDT by telephone and verified that I am speaking with the correct person using two identifiers.   I discussed the limitations, risks, security and privacy concerns of performing an evaluation and management service by telephone and the availability of in person appointments. I also discussed with the patient that there may be a patient responsible charge related to this service. The patient expressed understanding and agreed to proceed.   History of Present Illness:  1.  Weakly provoked right leg DVT diagnosed on 05/04/2018, after a 4-hour flight.  He was treated with Xarelto from October 2019 through February 2020.  He is also heterozygous for factor V Leiden mutation.  2.  Prostate cancer: -[place]en [place] of [place] in October 2018.  Last PSA was reportedly undetectable a month ago.  3.  Smoking history: -CT chest low-dose on 07/20/2018 was lung RADS 2S.    Observations/Objective: He does not report any swelling of his lower extremities.  Denies any shortness of breath or pleuritic type of chest pain.  Denies any fevers, night sweats or weight loss.  No lightheadedness reported.  Assessment and Plan:  1.  Weakly provoked right leg DVT: -His most recent D-dimer was elevated at 2.31. -Hence have done a Doppler of the lower extremities on 05/31/2019.  I reviewed the results which was negative for DVT in bilateral lower extremities. -However his D-dimer has been elevated at 2.31 since July. -He had hip replacement surgery done in July. -Elevated D-dimer after discontinuation of anticoagulation usually portends high risk for recurrence.  He is reluctant to consider any anticoagulation at this time. As she is agreeable to follow-up in 3 months with repeat a D-dimer.  If there are significant changes will consider further imaging.   Follow Up Instructions: 3 months with D-dimer levels.    I discussed the assessment and treatment plan with the patient. The patient was provided an opportunity to ask questions and all were answered. The patient agreed with the plan and demonstrated an understanding of the instructions.   The patient was advised to call back or seek an in-person evaluation if the symptoms worsen or if the condition fails to improve as anticipated.  I provided 12 minutes of non-face-to-face time during this encounter.   Derek Jack, MD

## 2019-06-06 NOTE — Telephone Encounter (Signed)
Dr Valeta Harms, Pt has called asking about f/u lung screening Ct.  Last Ct was ordered by you in 07/2018.  Judson Roch has never seen this pt.  Would like [place] to follow him in the lung screening program or discuss f/u CT's with him at his ov with you on 06/08/19?

## 2019-06-08 ENCOUNTER — Ambulatory Visit: Payer: 59 | Admitting: Pulmonary Disease

## 2019-06-08 NOTE — Telephone Encounter (Signed)
PCCM: Yes ok to enroll in LDCT program Garner Nash, DO [facility] 06/08/2019 8:01 AM

## 2019-06-09 NOTE — Telephone Encounter (Signed)
Spoke with pt and advised that we will start him in lung cancer screening in 07/2019 which will be 1 year from his last Chest CT.  Pt Is aware that I will call him closer to that time to get him scheduled for [facility] and CT. PT verbalized understanding .  Nothing further needed at this time.

## 2019-06-15 ENCOUNTER — Other Ambulatory Visit: Payer: Self-pay

## 2019-06-15 ENCOUNTER — Encounter: Payer: Self-pay | Admitting: Pulmonary Disease

## 2019-06-15 ENCOUNTER — Ambulatory Visit (INDEPENDENT_AMBULATORY_CARE_PROVIDER_SITE_OTHER): Payer: 59 | Admitting: Pulmonary Disease

## 2019-06-15 VITALS — BP 148/98 | HR 78 | Temp 97.8°F | Ht 74.0 in | Wt 227.6 lb

## 2019-06-15 DIAGNOSIS — J449 Chronic obstructive pulmonary disease, unspecified: Secondary | ICD-10-CM | POA: Diagnosis not present

## 2019-06-15 DIAGNOSIS — J302 Other seasonal allergic rhinitis: Secondary | ICD-10-CM | POA: Diagnosis not present

## 2019-06-15 DIAGNOSIS — Z87891 Personal history of nicotine dependence: Secondary | ICD-10-CM | POA: Diagnosis not present

## 2019-06-15 DIAGNOSIS — Z86718 Personal history of other venous thrombosis and embolism: Secondary | ICD-10-CM | POA: Diagnosis not present

## 2019-06-15 NOTE — Patient Instructions (Addendum)
Thank you for visiting Dr. Valeta Harms at [facility]. Today we recommend the following:  Continue your exercise activities.  Please call with any questions or concerns.  Continue your current inhaler regimen.   Return in about 1 year (around 06/14/2020) for with APP or Dr. Valeta Harms.    Please do your part to reduce the spread of COVID-19.

## 2019-06-15 NOTE — Progress Notes (Signed)
Synopsis: Referred in August 2019 for COPD, RUL pulmonary nodule by [name], MD  Subjective:   PATIENT ID: [name] GENDER: male DOB: 11-06-1955, MRN: [id number]  Chief Complaint  Patient presents with  . Follow-up    COPD. States his breathing has been at his baseline. Using Trelegy daily. Rarely uses albuterol. No new concerns.     Initial office visit: Patient has a past medical history of obstructive sleep apnea, prostate cancer as well as tobacco abuse and has been labeled with COPD in the past.  No prior PFTs.  He was recently seen by his PCP with complaints of worsening shortness of breath, 35-pack-year history of smoking, quit smoking 2 years ago, started vaping but recently quit vaping approximately 3 to 4 months ago. He is active outside, plays golf regularly and swims. 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 He gets short winded, SOB and DOE with any exertion. He feels like he has had to slow down due to his symptoms. 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 He has noticed people asking is he is "ok" are you breathing okay situation. He has a had a few episodes where he was seen in the ED. He was at work and couldn't get his breath and he was transported from work.  He has never been intubated or placed on mechanical ventilation.  He has never needed NIPPV for an exacerbation.  OV 05/12/2018: Since our last visit a PET scan was completed for further evaluation of the lung nodule seen on initial LDCT. This revealed low level uptake now with planned follow up. However there was abnormal uptake within the colon which led to a C-scope by GI revealing multiple polyps, tubular adenomas, no cancer detected and colonic diverticulosis. On 10/1 he had a LE Duplex + Right Popliteal DVT and he was started on Xarelto.  This was considered a provoked DVT following a 4-hour flight coming back from [place].  He did state that his breathing was  difficult while he was in [place].  He did have difficulty with exertion and climbing out of the [facility] when he was out with a group sightseeing.  At this point denies any additional chest pain, wheezing or chest tightness.  He has been using his medication regularly.  OV 07/07/2018: He was started on Trelegy after last office visit.  He is here today for follow-up to make sure that he is still doing well with his breathing.  He is very excited to tell me that he is feeling much better.  He has noticed a significant difference in his ability to walk further and get more done at work.  He still been able to play golf on occasion.  He feels like he is less breathless with significant exertion.  He denies any ongoing cough.  He is somewhat anxious about everything that has happened this past year.  After finding the lung nodule that [name] to be followed up on CT as well as the PET scan which led to evaluation of the colon and the multiple colon polyps.  He also has follow-up with GI shortly to discuss this again.  He also developed a blood clot and currently on Xarelto.  He also has follow-up with hematology after he completes therapy with his Xarelto.  OV 10/07/2018: increased SOB for the past 3 days. He has feeling for the past few days.  Has noticed nocturnal wheezing.  Has been using his  nebulized treatments.  He is fixing to go on a golf trip for the next couple of days.  He has been using his Trelegy inhaler regularly.  He has noticed predominantly more labored breathing with exertion.  He has not had much change in his sputum production.  He does state that he is finishing his Xarelto therapy on 3/7 to complete 3 months.  He did have a recent viral gastroenteritis about a month ago which required ER visit.  He does feel like if he could breathe a little bit better he would be back to his previous baseline.  We did discuss his prior CT imaging that was completed in November.  He is scheduled for 25-month  low-dose cancer screening CT follow-up.  OV 06/15/2019: Patient here today for follow-up regarding COPD.  Patient doing very well today currently managed with Trelegy inhaler plus albuterol.  He rarely uses his albuterol at this time.  He is recently stopped anticoagulation.  Follows with Dr. Delton Coombes his hematologist.  His job position was eliminated due to Covid back in June.  In July he underwent a total hip.  Overall doing very well since then.  Has been playing golf several times a week.  He is also trying to swim regularly to help exercise.  At this time has no significant respiratory complaints or change.  Does have some mucus production throughout the day.  And constantly clearing his throat.  He does believe this is stable at this time.    Past Medical History:  Diagnosis Date  . Bilateral shoulder bursitis   . Chronic back pain   . COPD (chronic obstructive pulmonary disease) with emphysema ([place])   . DDD (degenerative disc disease), lumbar    hx epideral injection  L5--S1   . DVT (deep venous thrombosis) ([place]) 11/19/2018  . Dysphasia    intermittant w/ food   . Dyspnea   . Dyspnea on exertion   . GERD (gastroesophageal reflux disease)   . Gout    L great toe  . Hiatal hernia   . Hip problem 2020   left, seeing orthopedics  . History of acute pancreatitis    alcoholic pancreatitis [id number] and 01-30-2012  . History of colon polyps    10-17-2005  hyperplastic polyp's  . History of esophageal stricture    s/p  dilation 02-02-2017  . History of peptic ulcer 1990s  . History of pneumococcal pneumonia 2006   bilateral pneumonia w/ left lower lobe abscess treated w/ chest tube with suction for drainage  . History of pneumothorax    1984--  left spontaneous pneumothorax ,  treated w/ chest tube  . Hypertension   . Malignant neoplasm of prostate [facility]) urologist-  dr dahlstedt/  oncologist -- dr Tammi Klippel   dx 12-02-2016-- Stage T2b, Gleason 3+4,  PSA 5.99,  vol 25cc  .  Numbness of left foot    outer left ankle numb-- per pt has appt. w/ neurologist  . OSA (obstructive sleep apnea)    per pt has not used cpap over year ago from 04-16-2017--- per study 01-17-2010  severe osa  . Solid nodule of lung greater than 8 mm in diameter 05/11/2018   03/2018: RUL Nodule, 58mm  . Wears glasses      Family History  Problem Relation Age of Onset  . Diabetes Mother   . Heart disease Father   . Heart disease Brother   . Cancer Neg Hx   . Colon cancer Neg Hx   .  Colon polyps Neg Hx   . Neuropathy Neg Hx      Social History   Socioeconomic History  . Marital status: Married    Spouse name: Not on file  . Number of children: 2  . Years of education: Not on file  . Highest education level: Bachelor's degree (e.g., BA, AB, BS)  Occupational History  . Not on file  Social [name]  . Financial resource strain: Not on file  . Food insecurity    Worry: Not on file    Inability: Not on file  . Transportation [name]    Medical: Not on file    Non-medical: Not on file  Tobacco Use  . Smoking status: Former Smoker    Packs/day: 1.00    Years: 30.00    Pack years: 30.00    Types: Cigarettes, E-cigarettes    Quit date: 10/03/2015    Years since quitting: 3.7  . Smokeless tobacco: Never Used  . Tobacco comment: stoped vaping 2018  Substance and Sexual Activity  . Alcohol use: Yes    Alcohol/week: 21.0 standard drinks    Types: 21 Shots of liquor per week    Comment: 2-3 drinks daily (Wine); 01/04/19 pt states he hasn't had anything to drink in 2-3 months  . Drug use: No  . Sexual activity: Yes    Birth control/protection: None  Lifestyle  . Physical activity    Days per week: Not on file    Minutes per session: Not on file  . Stress: Not on file  Relationships  . Social Herbalist on phone: Not on file    Gets together: Not on file    Attends religious service: Not on file    Active member of club or organization: Not on file    Attends meetings  of clubs or organizations: Not on file    Relationship status: Not on file  . Intimate partner violence    Fear of current or ex partner: Not on file    Emotionally abused: Not on file    Physically abused: Not on file    Forced sexual activity: Not on file  Other Topics Concern  . Not on file  Social History Narrative   Lives at home with his wife   Right handed     No Known Allergies   Outpatient Medications Prior to Visit  Medication Sig Dispense Refill  . acetaminophen (TYLENOL) 500 MG tablet Take 500 mg by mouth every 6 (six) hours as needed.    Marland Kitchen albuterol (PROVENTIL HFA;VENTOLIN HFA) 108 (90 Base) MCG/ACT inhaler Inhale 2 puffs into the lungs every 4 (four) hours as needed for wheezing or shortness of breath. 1 Inhaler 5  . albuterol (PROVENTIL) (2.5 MG/3ML) 0.083% nebulizer solution Take 3 mLs (2.5 mg total) by nebulization every 6 (six) hours as needed for wheezing or shortness of breath. 300 mL 5  . aspirin EC 81 MG tablet Take 81 mg by mouth daily.    Marland Kitchen doxycycline (VIBRAMYCIN) 50 MG capsule Take 50 mg by mouth daily.    . Fluticasone-Umeclidin-Vilant (TRELEGY ELLIPTA) 100-62.5-25 MCG/INH AEPB Inhale 1 puff into the lungs daily. 60 each 6  . pantoprazole (PROTONIX) 40 MG tablet TAKE 1 TABLET(40 MG) BY MOUTH DAILY 30 MINUTES BEFORE BREAKFAST 90 tablet 3   No facility-administered medications prior to visit.     Review of Systems  Constitutional: Negative for chills, fever, malaise/fatigue and weight loss.  HENT: Positive for congestion. Negative for  hearing loss, sore throat and tinnitus.   Eyes: Negative for blurred vision and double vision.  Respiratory: Negative for cough, hemoptysis, sputum production, shortness of breath, wheezing and stridor.        Sputum production  Cardiovascular: Negative for chest pain, palpitations, orthopnea, leg swelling and PND.  Gastrointestinal: Negative for abdominal pain, constipation, diarrhea, heartburn, nausea and vomiting.   Genitourinary: Negative for dysuria, hematuria and urgency.  Musculoskeletal: Negative for joint pain and myalgias.  Skin: Negative for itching and rash.  Neurological: Negative for dizziness, tingling, weakness and headaches.  Endo/Heme/Allergies: Negative for environmental allergies. Does not bruise/bleed easily.  Psychiatric/Behavioral: Negative for depression. The patient is not nervous/anxious and does not have insomnia.   All other systems reviewed and are negative.    Objective:  Physical Exam Vitals signs reviewed.  Constitutional:      General: He is not in acute distress.    Appearance: He is well-developed. He is obese.  HENT:     Head: Normocephalic and atraumatic.  Eyes:     General: No scleral icterus.    Conjunctiva/sclera: Conjunctivae normal.     Pupils: Pupils are equal, round, and reactive to light.  Neck:     Musculoskeletal: Neck supple.     Vascular: No JVD.     Trachea: No tracheal deviation.  Cardiovascular:     Rate and Rhythm: Normal rate and regular rhythm.     Heart sounds: Normal heart sounds. No murmur.  Pulmonary:     Effort: Pulmonary effort is normal. No tachypnea, accessory muscle usage or respiratory distress.     Breath sounds: Normal breath sounds. No stridor. No wheezing, rhonchi or rales.  Abdominal:     General: Bowel sounds are normal. There is no distension.     Palpations: Abdomen is soft.     Tenderness: There is no abdominal tenderness.  Musculoskeletal:        General: No tenderness.  Lymphadenopathy:     Cervical: No cervical adenopathy.  Skin:    General: Skin is warm and dry.     Capillary Refill: Capillary refill takes less than 2 seconds.     Findings: No rash.  Neurological:     Mental Status: He is alert and oriented to person, place, and time.  Psychiatric:        Behavior: Behavior normal.     Vitals:   06/15/19 1011  BP: (!) 148/98  Pulse: 78  Temp: 97.8 F (36.6 C)  TempSrc: Temporal  SpO2: 98%   Weight: 227 lb 9.6 oz (103.2 kg)  Height: 6\' 2"  (1.88 m)   98% on RA BMI Readings from Last 3 Encounters:  06/15/19 29.22 kg/m  05/30/19 29.46 kg/m  03/02/19 29.18 kg/m   Wt Readings from Last 3 Encounters:  06/15/19 227 lb 9.6 oz (103.2 kg)  05/30/19 223 lb 4.8 oz (101.3 kg)  03/02/19 221 lb 3.2 oz (100.3 kg)     CBC    Component Value Date/Time   WBC 6.8 02/14/2019 1030   RBC 4.80 02/14/2019 1030   HGB 14.4 02/14/2019 1030   HCT 44.0 02/14/2019 1030   PLT 242 02/14/2019 1030   MCV 91.7 02/14/2019 1030   MCH 30.0 02/14/2019 1030   MCHC 32.7 02/14/2019 1030   RDW 12.9 02/14/2019 1030   LYMPHSABS 1.7 02/14/2019 1030   MONOABS 1.1 (H) 02/14/2019 1030   EOSABS 0.2 02/14/2019 1030   BASOSABS 0.0 02/14/2019 1030     Chest Imaging: LDCT 03/2018 -  RUL Lung nodule, approximately 53mm with areas of surrounding scar, significant BL upper lobe predominant emphysema The patient's images have been independently reviewed by me.    PET Scan: 03/23/2018 Mild uptake in the RUL density. Extensive colonic uptake from PET scan  07/20/2018: CT chest  Lung RADS 2, three-vessel coronary disease, stable right upper lobe pulmonary nodule. The patient's images have been independently reviewed by me.    December 2020: CT chest lung cancer screening pending   Pulmonary Functions Testing Results: PFT Results Latest Ref Rng & Units 03/22/2018  FVC-Pre L 3.30  FVC-Predicted Pre % 63  FVC-Post L 3.40  FVC-Predicted Post % 65  Pre FEV1/FVC % % 64  Post FEV1/FCV % % 69  FEV1-Pre L 2.12  FEV1-Predicted Pre % 54  FEV1-Post L 2.36  DLCO UNC% % 52  DLCO COR %Predicted % 69  TLC L 4.69  TLC % Predicted % 62  RV % Predicted % 38    FeNO: None  Pathology: None   Echocardiogram: None   Heart Catheterization: None     Assessment & Plan:   Moderate COPD (chronic obstructive pulmonary disease) ([name])  Former smoker  Seasonal allergies  History of deep venous  thrombosis   Discussion:  63 year old gentleman with moderate COPD currently maintained on Trelegy.  Symptoms are much improved compared to where he was last year.  He does have daily sputum production.  This is manageable for him at this time.  He rarely uses his albuterol inhaler.  He does have seasonal allergies and will plan to restart some of his antihistamines and nasal spray in the springtime this is his plan.  Plan: Continue Trelegy inhaler Continue albuterol as needed shortness breath and wheezing. Follow-up in our clinic in 1 year or as needed if symptoms change. Coagulation management per hematology. Follow-up with neurology regarding his sleep [name].  61-month low-dose lung cancer screening CT follow-up to be scheduled in December 2020.  This has already been discussed with our LDCT program.  We will call him with the results of this CT scan result.  Return to clinic in 1 year or as needed.  Greater than 50% of this patient's 15-minute office was in face-to-face discussing the above recommendations and treatment plan.    Current Outpatient Medications:  .  acetaminophen (TYLENOL) 500 MG tablet, Take 500 mg by mouth every 6 (six) hours as needed., Disp: , Rfl:  .  albuterol (PROVENTIL HFA;VENTOLIN HFA) 108 (90 Base) MCG/ACT inhaler, Inhale 2 puffs into the lungs every 4 (four) hours as needed for wheezing or shortness of breath., Disp: 1 Inhaler, Rfl: 5 .  albuterol (PROVENTIL) (2.5 MG/3ML) 0.083% nebulizer solution, Take 3 mLs (2.5 mg total) by nebulization every 6 (six) hours as needed for wheezing or shortness of breath., Disp: 300 mL, Rfl: 5 .  aspirin EC 81 MG tablet, Take 81 mg by mouth daily., Disp: , Rfl:  .  doxycycline (VIBRAMYCIN) 50 MG capsule, Take 50 mg by mouth daily., Disp: , Rfl:  .  Fluticasone-Umeclidin-Vilant (TRELEGY ELLIPTA) 100-62.5-25 MCG/INH AEPB, Inhale 1 puff into the lungs daily., Disp: 60 each, Rfl: 6 .  pantoprazole (PROTONIX) 40 MG tablet, TAKE  1 TABLET(40 MG) BY MOUTH DAILY 30 MINUTES BEFORE BREAKFAST, Disp: 90 tablet, Rfl: 3   Garner Nash, DO  Pulmonary Critical Care 06/15/2019 10:34 AM

## 2019-07-22 ENCOUNTER — Other Ambulatory Visit: Payer: Self-pay | Admitting: *Deleted

## 2019-07-22 DIAGNOSIS — Z87891 Personal history of nicotine dependence: Secondary | ICD-10-CM

## 2019-08-03 ENCOUNTER — Ambulatory Visit
Admission: RE | Admit: 2019-08-03 | Discharge: 2019-08-03 | Disposition: A | Discharge status: Home / Self Care | Payer: 59 | Source: Ambulatory Visit | Attending: Acute Care | Admitting: Acute Care

## 2019-08-03 ENCOUNTER — Other Ambulatory Visit: Payer: Self-pay

## 2019-08-03 ENCOUNTER — Ambulatory Visit: Payer: 59

## 2019-08-03 ENCOUNTER — Encounter: Payer: Self-pay | Admitting: Acute Care

## 2019-08-03 ENCOUNTER — Ambulatory Visit (INDEPENDENT_AMBULATORY_CARE_PROVIDER_SITE_OTHER): Payer: 59 | Admitting: Acute Care

## 2019-08-03 VITALS — BP 136/70 | HR 94 | Temp 97.7°F | Ht 74.0 in | Wt 232.0 lb

## 2019-08-03 DIAGNOSIS — Z87891 Personal history of nicotine dependence: Secondary | ICD-10-CM

## 2019-08-03 NOTE — Progress Notes (Signed)
Shared Decision Making Visit Lung Cancer Screening Program [phone])   Eligibility:  Age 63 y.o.  Pack Years Smoking History Calculation 53 pack year history (# packs/per year x # years smoked)  Recent History of coughing up blood  no  Unexplained weight loss? no ( >Than 15 pounds within the last 6 months )  Prior History Lung / other cancer no (Diagnosis within the last 5 years already requiring surveillance chest CT Scans).  Smoking Status Former Smoker  Former Smokers: Years since quit: 4 years  Quit Date: 2016  Visit Components:  Discussion included one or more decision making aids. yes  Discussion included risk/benefits of screening. yes  Discussion included potential follow up diagnostic testing for abnormal scans. yes  Discussion included meaning and risk of over diagnosis. yes  Discussion included meaning and risk of False Positives. yes  Discussion included meaning of total radiation exposure. yes  Counseling Included:  Importance of adherence to annual lung cancer LDCT screening. yes  Impact of comorbidities on ability to participate in the program. yes  Ability and willingness to under diagnostic treatment. yes  Smoking Cessation Counseling:  Current Smokers:   Discussed importance of smoking cessation. yes  Information about tobacco cessation classes and interventions provided to patient. yes  Patient provided with "ticket" for LDCT Scan. yes  Symptomatic Patient. no  Counseling  Diagnosis Code: Tobacco Use Z72.0  Asymptomatic Patient yes  Counseling (Intermediate counseling: > three minutes counseling) [id number]  Former Smokers:   Discussed the importance of maintaining cigarette abstinence. yes  Diagnosis Code: Personal History of Nicotine Dependence. [id number]  Information about tobacco cessation classes and interventions provided to patient. Yes  Patient provided with "ticket" for LDCT Scan. yes  Written Order for Lung Cancer Screening  with LDCT placed in Epic. Yes (CT Chest Lung Cancer Screening Low Dose W/O CM) [id number] Z12.2-Screening of respiratory organs Z87.891-Personal history of nicotine dependence  BP 136/70 (BP Location: Left Arm, Cuff Size: Large)   Pulse 94   Temp 97.7 F (36.5 C) (Oral)   Ht 6\' 2"  (1.88 m)   Wt 232 lb (105.2 kg)   SpO2 96%   BMI 29.79 kg/m    I spent 25 minutes of face to face time with Mr. Doub discussing the risks and benefits of lung cancer screening. We viewed a power point together that explained in detail the above noted topics. We took the time to pause the power point at intervals to allow for questions to be asked and answered to ensure understanding. We discussed that he had taken the single most powerful action possible to decrease his risk of developing lung cancer when he quit smoking. I counseled him to remain smoke free, and to contact me if he ever had the desire to smoke again so that I can provide resources and tools to help support the effort to remain smoke free. We discussed the time and location of the scan, and that either  Doroteo Glassman RN or I will call with the results within  24-48 hours of receiving them. He has my card and contact information in the event he needs to speak with me, in addition to a copy of the power point we reviewed as a resource. He verbalized understanding of all of the above and had no further questions upon leaving the office.     I explained to the patient that there has been a high incidence of coronary artery disease noted on these exams. I explained that this is  a non-gated exam therefore degree or severity cannot be determined. This patient is not on statin therapy. I have asked the patient to follow-up with their PCP regarding any incidental finding of coronary artery disease and management with diet or medication as they feel is clinically indicated. The patient verbalized understanding of the above and had no further  questions.     Magdalen Spatz, NP 08/03/2019'4:58 PM

## 2019-08-03 NOTE — Patient Instructions (Signed)
Thank you for participating in the [facility] Scre[facility] Program. It was our pleasure to meet you today. We will call you with the results of your scan within the next few days. Your scan will be assigned a Lung RADS category score by the physicians reading the scans.  This Lung RADS score determines follow up scanning.  See below for description of categories, and follow up screening recommendations. We will be in touch to schedule your follow up screening annually or based on recommendations of our providers. We will fax a copy of your scan results to your Primary Care Physician, or the physician who referred you to the program, to ensure they have the results. Please call the office if you have any questions or concerns regarding your scanning experience or results.  Our office number is [phone]. Please speak with Denise Phelps, RN. She is our Lung Cancer Screening RN. If she is unavailable when you call, please have the office staff send her a message. She will return your call at her earliest convenience. Remember, if your scan is normal, we will scan you annually as long as you continue to meet the criteria for the program. (Age 55-77, Current smoker or smoker who has quit within the last 15 years). If you are a smoker, remember, quitting is the single most powerful action that you can take to decrease your risk of lung cancer and other pulmonary, breathing related problems. We know quitting is hard, and we are here to help.  Please let us know if there is anything we can do to help you meet your goal of quitting. If you are a former smoker, congratulations. We are proud of you! Remain smoke free! Remember you can refer friends or family members through the number above.  We will screen them to make sure they meet criteria for the program. Thank you for helping us take better care of you by participating in Lung Screening.  Lung RADS Categories:  Lung RADS 1: no nodules  or definitely non-concerning nodules.  Recommendation is for a repeat annual scan in 12 months.  Lung RADS 2:  nodules that are non-concerning in appearance and behavior with a very low likelihood of becoming an active cancer. Recommendation is for a repeat annual scan in 12 months.  Lung RADS 3: nodules that are probably non-concerning , includes nodules with a low likelihood of becoming an active cancer.  Recommendation is for a 6-month repeat screening scan. Often noted after an upper respiratory illness. We will be in touch to make sure you have no questions, and to schedule your 6-month scan.  Lung RADS 4 A: nodules with concerning findings, recommendation is most often for a follow up scan in 3 months or additional testing based on our provider's assessment of the scan. We will be in touch to make sure you have no questions and to schedule the recommended 3 month follow up scan.  Lung RADS 4 B:  indicates findings that are concerning. We will be in touch with you to schedule additional diagnostic testing based on our provider's  assessment of the scan.   

## 2019-08-08 ENCOUNTER — Other Ambulatory Visit: Payer: Self-pay | Admitting: *Deleted

## 2019-08-08 DIAGNOSIS — Z87891 Personal history of nicotine dependence: Secondary | ICD-10-CM

## 2019-08-15 ENCOUNTER — Other Ambulatory Visit: Payer: Self-pay

## 2019-08-15 DIAGNOSIS — Z8546 Personal history of malignant neoplasm of prostate: Secondary | ICD-10-CM

## 2019-08-16 ENCOUNTER — Other Ambulatory Visit: Payer: Self-pay | Admitting: Emergency Medicine

## 2019-08-16 MED ORDER — TRELEGY ELLIPTA 100-62.5-25 MCG/INH IN AEPB: 1.0000 | INHALATION_SPRAY | Freq: Every day | RESPIRATORY_TRACT | 11 refills | Status: DC

## 2019-08-17 ENCOUNTER — Other Ambulatory Visit (HOSPITAL_COMMUNITY): Payer: Self-pay | Admitting: Hematology

## 2019-08-17 ENCOUNTER — Telehealth (HOSPITAL_COMMUNITY): Payer: Self-pay | Admitting: *Deleted

## 2019-08-17 NOTE — Telephone Encounter (Signed)
Pt called into clinic wanting to make sure it was safe for him to take Xarelto for 14 days after his hip replacement surgery. Joe Bowl, NP made aware and stated due to pt's meidcal history with DVT pt should follow surgeons request to start Xarelto as directed. Called pt back to let him know pt verbalized understanding.

## 2019-09-06 ENCOUNTER — Other Ambulatory Visit (HOSPITAL_COMMUNITY): Payer: Self-pay | Admitting: *Deleted

## 2019-09-06 DIAGNOSIS — C61 Malignant neoplasm of prostate: Secondary | ICD-10-CM

## 2019-09-06 DIAGNOSIS — I82431 Acute embolism and thrombosis of right popliteal vein: Secondary | ICD-10-CM

## 2019-09-07 ENCOUNTER — Inpatient Hospital Stay (HOSPITAL_COMMUNITY): Payer: 59 | Attending: Hematology

## 2019-09-07 ENCOUNTER — Other Ambulatory Visit: Payer: Self-pay

## 2019-09-07 DIAGNOSIS — Z86718 Personal history of other venous thrombosis and embolism: Secondary | ICD-10-CM | POA: Insufficient documentation

## 2019-09-07 DIAGNOSIS — C61 Malignant neoplasm of prostate: Secondary | ICD-10-CM

## 2019-09-07 DIAGNOSIS — D6851 Activated protein C resistance: Secondary | ICD-10-CM | POA: Insufficient documentation

## 2019-09-07 DIAGNOSIS — Z96641 Presence of right artificial hip joint: Secondary | ICD-10-CM | POA: Insufficient documentation

## 2019-09-07 DIAGNOSIS — Z8546 Personal history of malignant neoplasm of prostate: Secondary | ICD-10-CM | POA: Diagnosis not present

## 2019-09-07 DIAGNOSIS — I82431 Acute embolism and thrombosis of right popliteal vein: Secondary | ICD-10-CM

## 2019-09-07 LAB — CBC WITH DIFFERENTIAL/PLATELET
Abs Immature Granulocytes: 0.11 10*3/uL — ABNORMAL HIGH (ref 0.00–0.07)
Basophils Absolute: 0.1 10*3/uL (ref 0.0–0.1)
Basophils Relative: 1 %
Eosinophils Absolute: 0.3 10*3/uL (ref 0.0–0.5)
Eosinophils Relative: 5 %
HCT: 43 % (ref 39.0–52.0)
Hemoglobin: 14.4 g/dL (ref 13.0–17.0)
Immature Granulocytes: 2 %
Lymphocytes Relative: 32 %
Lymphs Abs: 2.2 10*3/uL (ref 0.7–4.0)
MCH: 34 pg (ref 26.0–34.0)
MCHC: 33.5 g/dL (ref 30.0–36.0)
MCV: 101.7 fL — ABNORMAL HIGH (ref 80.0–100.0)
Monocytes Absolute: 0.6 10*3/uL (ref 0.1–1.0)
Monocytes Relative: 8 %
Neutro Abs: 3.6 10*3/uL (ref 1.7–7.7)
Neutrophils Relative %: 52 %
Platelets: 266 10*3/uL (ref 150–400)
RBC: 4.23 MIL/uL (ref 4.22–5.81)
RDW: 13.1 % (ref 11.5–15.5)
WBC: 6.8 10*3/uL (ref 4.0–10.5)
nRBC: 0 % (ref 0.0–0.2)

## 2019-09-07 LAB — COMPREHENSIVE METABOLIC PANEL
ALT: 22 U/L (ref 0–44)
AST: 26 U/L (ref 15–41)
Albumin: 4 g/dL (ref 3.5–5.0)
Alkaline Phosphatase: 67 U/L (ref 38–126)
Anion gap: 10 (ref 5–15)
BUN: 16 mg/dL (ref 8–23)
CO2: 23 mmol/L (ref 22–32)
Calcium: 9.1 mg/dL (ref 8.9–10.3)
Chloride: 104 mmol/L (ref 98–111)
Creatinine, Ser: 0.88 mg/dL (ref 0.61–1.24)
GFR calc Af Amer: >60 mL/min (ref >60)
GFR calc non Af Amer: >60 mL/min (ref >60)
Glucose, Bld: 106 mg/dL — ABNORMAL HIGH (ref 70–99)
Potassium: 3.9 mmol/L (ref 3.5–5.1)
Sodium: 137 mmol/L (ref 135–145)
Total Bilirubin: 0.5 mg/dL (ref 0.3–1.2)
Total Protein: 7.3 g/dL (ref 6.5–8.1)

## 2019-09-07 LAB — D-DIMER, QUANTITATIVE: D-Dimer, Quant: 2.23 ug/mL-FEU — ABNORMAL HIGH (ref 0.00–0.50)

## 2019-09-12 ENCOUNTER — Encounter (HOSPITAL_COMMUNITY): Payer: Self-pay

## 2019-09-14 ENCOUNTER — Encounter (HOSPITAL_COMMUNITY): Payer: Self-pay | Admitting: Hematology

## 2019-09-14 ENCOUNTER — Inpatient Hospital Stay (HOSPITAL_BASED_OUTPATIENT_CLINIC_OR_DEPARTMENT_OTHER): Payer: 59 | Admitting: Hematology

## 2019-09-14 DIAGNOSIS — I82431 Acute embolism and thrombosis of right popliteal vein: Secondary | ICD-10-CM | POA: Diagnosis not present

## 2019-09-14 NOTE — Progress Notes (Signed)
Virtual Visit via Telephone Note  I connected with [name] on 09/14/19 at  2:50 PM EST by telephone and verified that I am speaking with the correct person using two identifiers.   I discussed the limitations, risks, security and privacy concerns of performing an evaluation and management service by telephone and the availability of in person appointments. I also discussed with the patient that there may be a patient responsible charge related to this service. The patient expressed understanding and agreed to proceed.   History of Present Illness: Patient followed for weekly provoked right leg DVT diagnosed on 05/04/2018, after 4 hour flight.  He was treated with Xarelto from October 2019 through February 2020.  He has also heterozygosity for factor V Leiden.  He had a history of prostate cancer, seed implantation and brachytherapy in October 2018.   Observations/Objective: Reports that he had right hip replaced in January 2021.  He was given Xarelto for 14 days.  He is actively doing physical therapy at home 3 times a day.  Denies any leg swellings.  Denies any shortness of breath or pleuritic chest pains.  Assessment and Plan:  1.  Weakly provoked right leg DVT: -Xarelto from October 2019 through February 2020. -He is also heterozygous for factor V Leiden. -He has mildly elevated D-dimer of around 2.3 since he discontinued anticoagulation. -Last lower extremity Doppler on 05/31/2019 was negative. -We reviewed his labs.  D-dimer is 2.2.  It is more or less stable. -I have recommended follow-up in 6 months with repeat D-dimer.   Follow Up Instructions: RTC 6 months with labs 1 day prior.   I discussed the assessment and treatment plan with the patient. The patient was provided an opportunity to ask questions and all were answered. The patient agreed with the plan and demonstrated an understanding of the instructions.   The patient was advised to call back or seek an in-person  evaluation if the symptoms worsen or if the condition fails to improve as anticipated.  I provided 8 minutes of non-face-to-face time during this encounter.   Derek Jack, MD

## 2019-09-14 NOTE — Patient Instructions (Signed)
[facility]  Discharge Instructions:  You had a phone visit with Dr. Katragadda today. _______________________________________________________________  Thank you for choosing [facility] to provide your oncology and hematology care.  To afford each patient quality time with our providers, please arrive at least 15 minutes before your scheduled appointment.  You need to re-schedule your appointment if you arrive 10 or more minutes late.  We strive to give you quality time with our providers, and arriving late affects you and other patients whose appointments are after yours.  Also, if you no show three or more times for appointments you may be dismissed from the clinic.  Again, thank you for choosing [facility]. Our hope is that these requests will allow you access to exceptional care and in a timely manner. _______________________________________________________________  If you have questions after your visit, please contact our office at [phone] between the hours of 8:30 a.m. and 5:00 p.m. Voicemails left after 4:30 p.m. will not be returned until the following business day. _______________________________________________________________  For prescription refill requests, have your pharmacy contact our office. _______________________________________________________________  Recommendations made by the consultant and any test results will be sent to your referring physician. _______________________________________________________________ 

## 2019-10-10 ENCOUNTER — Inpatient Hospital Stay (HOSPITAL_COMMUNITY)
Admission: EM | Admit: 2019-10-10 | Discharge: 2019-10-13 | DRG: 440 | Disposition: A | Discharge status: Home Health | Payer: 59 | Attending: Internal Medicine | Admitting: Internal Medicine

## 2019-10-10 ENCOUNTER — Encounter (HOSPITAL_COMMUNITY): Payer: Self-pay | Admitting: Emergency Medicine

## 2019-10-10 ENCOUNTER — Other Ambulatory Visit: Payer: Self-pay

## 2019-10-10 ENCOUNTER — Emergency Department (HOSPITAL_COMMUNITY): Payer: 59

## 2019-10-10 DIAGNOSIS — M109 Gout, unspecified: Secondary | ICD-10-CM | POA: Diagnosis present

## 2019-10-10 DIAGNOSIS — Z833 Family history of diabetes mellitus: Secondary | ICD-10-CM | POA: Diagnosis not present

## 2019-10-10 DIAGNOSIS — F10920 Alcohol use, unspecified with intoxication, uncomplicated: Secondary | ICD-10-CM | POA: Diagnosis not present

## 2019-10-10 DIAGNOSIS — K852 Alcohol induced acute pancreatitis without necrosis or infection: Secondary | ICD-10-CM | POA: Diagnosis not present

## 2019-10-10 DIAGNOSIS — K219 Gastro-esophageal reflux disease without esophagitis: Secondary | ICD-10-CM

## 2019-10-10 DIAGNOSIS — Z720 Tobacco use: Secondary | ICD-10-CM | POA: Diagnosis not present

## 2019-10-10 DIAGNOSIS — J439 Emphysema, unspecified: Secondary | ICD-10-CM | POA: Diagnosis present

## 2019-10-10 DIAGNOSIS — G8929 Other chronic pain: Secondary | ICD-10-CM | POA: Diagnosis present

## 2019-10-10 DIAGNOSIS — Y906 Blood alcohol level of 120-199 mg/100 ml: Secondary | ICD-10-CM | POA: Diagnosis present

## 2019-10-10 DIAGNOSIS — I10 Essential (primary) hypertension: Secondary | ICD-10-CM | POA: Diagnosis present

## 2019-10-10 DIAGNOSIS — M549 Dorsalgia, unspecified: Secondary | ICD-10-CM | POA: Diagnosis present

## 2019-10-10 DIAGNOSIS — F1020 Alcohol dependence, uncomplicated: Secondary | ICD-10-CM | POA: Diagnosis present

## 2019-10-10 DIAGNOSIS — F1010 Alcohol abuse, uncomplicated: Secondary | ICD-10-CM

## 2019-10-10 DIAGNOSIS — K449 Diaphragmatic hernia without obstruction or gangrene: Secondary | ICD-10-CM | POA: Diagnosis present

## 2019-10-10 DIAGNOSIS — F10120 Alcohol abuse with intoxication, uncomplicated: Secondary | ICD-10-CM | POA: Diagnosis present

## 2019-10-10 DIAGNOSIS — Z86718 Personal history of other venous thrombosis and embolism: Secondary | ICD-10-CM

## 2019-10-10 DIAGNOSIS — R1013 Epigastric pain: Secondary | ICD-10-CM

## 2019-10-10 DIAGNOSIS — Z87891 Personal history of nicotine dependence: Secondary | ICD-10-CM

## 2019-10-10 DIAGNOSIS — G4733 Obstructive sleep apnea (adult) (pediatric): Secondary | ICD-10-CM | POA: Diagnosis present

## 2019-10-10 DIAGNOSIS — R030 Elevated blood-pressure reading, without diagnosis of hypertension: Secondary | ICD-10-CM | POA: Diagnosis not present

## 2019-10-10 DIAGNOSIS — R079 Chest pain, unspecified: Secondary | ICD-10-CM

## 2019-10-10 DIAGNOSIS — K859 Acute pancreatitis without necrosis or infection, unspecified: Secondary | ICD-10-CM

## 2019-10-10 DIAGNOSIS — Z8546 Personal history of malignant neoplasm of prostate: Secondary | ICD-10-CM

## 2019-10-10 DIAGNOSIS — Z8249 Family history of ischemic heart disease and other diseases of the circulatory system: Secondary | ICD-10-CM | POA: Diagnosis not present

## 2019-10-10 DIAGNOSIS — Z7982 Long term (current) use of aspirin: Secondary | ICD-10-CM | POA: Diagnosis not present

## 2019-10-10 DIAGNOSIS — M5136 Other intervertebral disc degeneration, lumbar region: Secondary | ICD-10-CM | POA: Diagnosis present

## 2019-10-10 DIAGNOSIS — Z20822 Contact with and (suspected) exposure to covid-19: Secondary | ICD-10-CM | POA: Diagnosis present

## 2019-10-10 DIAGNOSIS — J449 Chronic obstructive pulmonary disease, unspecified: Secondary | ICD-10-CM | POA: Diagnosis not present

## 2019-10-10 LAB — TROPONIN I (HIGH SENSITIVITY)
Troponin I (High Sensitivity): 4 ng/L (ref <18)
Troponin I (High Sensitivity): 3 ng/L (ref <18)

## 2019-10-10 LAB — COMPREHENSIVE METABOLIC PANEL
ALT: 26 U/L (ref 0–44)
AST: 28 U/L (ref 15–41)
Albumin: 4.3 g/dL (ref 3.5–5.0)
Alkaline Phosphatase: 79 U/L (ref 38–126)
Anion gap: 14 (ref 5–15)
BUN: 16 mg/dL (ref 8–23)
CO2: 26 mmol/L (ref 22–32)
Calcium: 9.3 mg/dL (ref 8.9–10.3)
Chloride: 99 mmol/L (ref 98–111)
Creatinine, Ser: 0.94 mg/dL (ref 0.61–1.24)
GFR calc Af Amer: >60 mL/min (ref >60)
GFR calc non Af Amer: >60 mL/min (ref >60)
Glucose, Bld: 151 mg/dL — ABNORMAL HIGH (ref 70–99)
Potassium: 4.1 mmol/L (ref 3.5–5.1)
Sodium: 139 mmol/L (ref 135–145)
Total Bilirubin: 0.8 mg/dL (ref 0.3–1.2)
Total Protein: 7.8 g/dL (ref 6.5–8.1)

## 2019-10-10 LAB — CBC WITH DIFFERENTIAL/PLATELET
Abs Immature Granulocytes: 0.06 10*3/uL (ref 0.00–0.07)
Basophils Absolute: 0.1 10*3/uL (ref 0.0–0.1)
Basophils Relative: 1 %
Eosinophils Absolute: 0.1 10*3/uL (ref 0.0–0.5)
Eosinophils Relative: 1 %
HCT: 50.2 % (ref 39.0–52.0)
Hemoglobin: 17 g/dL (ref 13.0–17.0)
Immature Granulocytes: 1 %
Lymphocytes Relative: 21 %
Lymphs Abs: 2.3 10*3/uL (ref 0.7–4.0)
MCH: 33.8 pg (ref 26.0–34.0)
MCHC: 33.9 g/dL (ref 30.0–36.0)
MCV: 99.8 fL (ref 80.0–100.0)
Monocytes Absolute: 0.7 10*3/uL (ref 0.1–1.0)
Monocytes Relative: 6 %
Neutro Abs: 7.9 10*3/uL — ABNORMAL HIGH (ref 1.7–7.7)
Neutrophils Relative %: 70 %
Platelets: 175 10*3/uL (ref 150–400)
RBC: 5.03 MIL/uL (ref 4.22–5.81)
RDW: 12.7 % (ref 11.5–15.5)
WBC: 11.1 10*3/uL — ABNORMAL HIGH (ref 4.0–10.5)
nRBC: 0 % (ref 0.0–0.2)

## 2019-10-10 LAB — PHOSPHORUS: Phosphorus: 2.2 mg/dL — ABNORMAL LOW (ref 2.5–4.6)

## 2019-10-10 LAB — ETHANOL: Alcohol, Ethyl (B): 187 mg/dL — ABNORMAL HIGH (ref <10)

## 2019-10-10 LAB — VITAMIN B12: Vitamin B-12: 226 pg/mL (ref 180–914)

## 2019-10-10 LAB — SARS CORONAVIRUS 2 (TAT 6-24 HRS): SARS Coronavirus 2: NEGATIVE

## 2019-10-10 LAB — LIPASE, BLOOD: Lipase: 171 U/L — ABNORMAL HIGH (ref 11–51)

## 2019-10-10 LAB — TSH: TSH: 1.947 u[IU]/mL (ref 0.350–4.500)

## 2019-10-10 LAB — HEMOGLOBIN A1C
Hgb A1c MFr Bld: 5.3 % (ref 4.8–5.6)
Mean Plasma Glucose: 105.41 mg/dL

## 2019-10-10 MED ORDER — ADULT MULTIVITAMIN W/MINERALS CH
1.0000 | ORAL_TABLET | Freq: Every day | ORAL | Status: DC
  Administered 2019-10-10 – 2019-10-13 (×4): 1 via ORAL
  Filled 2019-10-10 (×4): qty 1

## 2019-10-10 MED ORDER — ALBUTEROL SULFATE HFA 108 (90 BASE) MCG/ACT IN AERS
1.0000 | INHALATION_SPRAY | Freq: Four times a day (QID) | RESPIRATORY_TRACT | Status: DC | PRN
  Administered 2019-10-11: 2 via RESPIRATORY_TRACT
  Filled 2019-10-10: qty 6.7

## 2019-10-10 MED ORDER — ONDANSETRON HCL 4 MG/2ML IJ SOLN
4.0000 mg | Freq: Once | INTRAMUSCULAR | Status: AC
  Administered 2019-10-10: 06:00:00 4 mg via INTRAVENOUS
  Filled 2019-10-10: qty 2

## 2019-10-10 MED ORDER — THIAMINE HCL 100 MG/ML IJ SOLN: 100.0000 mg | Freq: Every day | INTRAMUSCULAR | Status: DC

## 2019-10-10 MED ORDER — MORPHINE SULFATE (PF) 4 MG/ML IV SOLN
3.0000 mg | INTRAVENOUS | Status: DC | PRN
  Administered 2019-10-10 (×2): 3 mg via INTRAVENOUS
  Filled 2019-10-10 (×2): qty 1

## 2019-10-10 MED ORDER — FLUTICASONE FUROATE-VILANTEROL 100-25 MCG/INH IN AEPB
1.0000 | INHALATION_SPRAY | Freq: Every day | RESPIRATORY_TRACT | Status: DC
  Administered 2019-10-11 – 2019-10-13 (×3): 1 via RESPIRATORY_TRACT
  Filled 2019-10-10: qty 28

## 2019-10-10 MED ORDER — ONDANSETRON HCL 4 MG/2ML IJ SOLN
4.0000 mg | Freq: Once | INTRAMUSCULAR | Status: AC
  Administered 2019-10-10: 4 mg via INTRAVENOUS
  Filled 2019-10-10: qty 2

## 2019-10-10 MED ORDER — IOHEXOL 300 MG/ML  SOLN
100.0000 mL | Freq: Once | INTRAMUSCULAR | Status: AC | PRN
  Administered 2019-10-10: 100 mL via INTRAVENOUS

## 2019-10-10 MED ORDER — THIAMINE HCL 100 MG PO TABS
100.0000 mg | ORAL_TABLET | Freq: Every day | ORAL | Status: DC
  Administered 2019-10-10 – 2019-10-13 (×4): 100 mg via ORAL
  Filled 2019-10-10 (×4): qty 1

## 2019-10-10 MED ORDER — MORPHINE SULFATE (PF) 4 MG/ML IV SOLN
4.0000 mg | INTRAVENOUS | Status: DC | PRN
  Administered 2019-10-10 – 2019-10-11 (×5): 4 mg via INTRAVENOUS
  Filled 2019-10-10 (×5): qty 1

## 2019-10-10 MED ORDER — PANTOPRAZOLE SODIUM 40 MG PO TBEC
40.0000 mg | DELAYED_RELEASE_TABLET | Freq: Every day | ORAL | Status: DC
  Administered 2019-10-10 – 2019-10-13 (×4): 40 mg via ORAL
  Filled 2019-10-10 (×4): qty 1

## 2019-10-10 MED ORDER — UMECLIDINIUM BROMIDE 62.5 MCG/INH IN AEPB
1.0000 | INHALATION_SPRAY | Freq: Every day | RESPIRATORY_TRACT | Status: DC
  Administered 2019-10-11 – 2019-10-13 (×3): 1 via RESPIRATORY_TRACT
  Filled 2019-10-10: qty 7

## 2019-10-10 MED ORDER — LORAZEPAM 2 MG/ML IJ SOLN
1.0000 mg | INTRAMUSCULAR | Status: AC | PRN
  Administered 2019-10-10 – 2019-10-11 (×4): 2 mg via INTRAVENOUS
  Administered 2019-10-12 – 2019-10-13 (×2): 1 mg via INTRAVENOUS
  Filled 2019-10-10 (×6): qty 1

## 2019-10-10 MED ORDER — ACETAMINOPHEN 325 MG PO TABS
650.0000 mg | ORAL_TABLET | Freq: Four times a day (QID) | ORAL | Status: DC | PRN
  Administered 2019-10-10: 650 mg via ORAL
  Administered 2019-10-12: 500 mg via ORAL
  Filled 2019-10-10 (×2): qty 2

## 2019-10-10 MED ORDER — SODIUM CHLORIDE 0.9 % IV BOLUS
1000.0000 mL | Freq: Once | INTRAVENOUS | Status: AC
  Administered 2019-10-10: 1000 mL via INTRAVENOUS

## 2019-10-10 MED ORDER — LORAZEPAM 1 MG PO TABS: 1.0000 mg | ORAL_TABLET | ORAL | Status: AC | PRN

## 2019-10-10 MED ORDER — SODIUM CHLORIDE 0.9 % IV SOLN: INTRAVENOUS | Status: DC

## 2019-10-10 MED ORDER — FLUTICASONE-UMECLIDIN-VILANT 100-62.5-25 MCG/INH IN AEPB: 1.0000 | INHALATION_SPRAY | Freq: Every day | RESPIRATORY_TRACT | Status: DC

## 2019-10-10 MED ORDER — FOLIC ACID 1 MG PO TABS
1.0000 mg | ORAL_TABLET | Freq: Every day | ORAL | Status: DC
  Administered 2019-10-10 – 2019-10-13 (×4): 1 mg via ORAL
  Filled 2019-10-10 (×4): qty 1

## 2019-10-10 MED ORDER — HYDRALAZINE HCL 20 MG/ML IJ SOLN
10.0000 mg | Freq: Three times a day (TID) | INTRAMUSCULAR | Status: DC | PRN
  Filled 2019-10-10: qty 1

## 2019-10-10 MED ORDER — MORPHINE SULFATE (PF) 4 MG/ML IV SOLN
4.0000 mg | Freq: Once | INTRAVENOUS | Status: AC
  Administered 2019-10-10: 06:00:00 4 mg via INTRAVENOUS
  Filled 2019-10-10: qty 1

## 2019-10-10 MED ORDER — PROCHLORPERAZINE EDISYLATE 10 MG/2ML IJ SOLN
10.0000 mg | Freq: Four times a day (QID) | INTRAMUSCULAR | Status: DC | PRN
  Administered 2019-10-10 – 2019-10-11 (×4): 10 mg via INTRAVENOUS
  Filled 2019-10-10 (×5): qty 2

## 2019-10-10 MED ORDER — ENOXAPARIN SODIUM 40 MG/0.4ML ~~LOC~~ SOLN
40.0000 mg | SUBCUTANEOUS | Status: DC
  Administered 2019-10-10 – 2019-10-13 (×4): 40 mg via SUBCUTANEOUS
  Filled 2019-10-10 (×4): qty 0.4

## 2019-10-10 MED ORDER — FENTANYL CITRATE (PF) 100 MCG/2ML IJ SOLN
50.0000 ug | Freq: Once | INTRAMUSCULAR | Status: AC
  Administered 2019-10-10: 08:00:00 50 ug via INTRAVENOUS
  Filled 2019-10-10: qty 2

## 2019-10-10 MED ORDER — FENTANYL CITRATE (PF) 100 MCG/2ML IJ SOLN
50.0000 ug | Freq: Once | INTRAMUSCULAR | Status: AC
  Administered 2019-10-10: 07:00:00 50 ug via INTRAVENOUS
  Filled 2019-10-10: qty 2

## 2019-10-10 NOTE — ED Provider Notes (Signed)
[facility] EMERGENCY DEPARTMENT Provider Note   CSN: [id number] Arrival date & time: 10/10/19  [id number]   Time seen 5:35 AM  History Chief Complaint  Patient presents with  . Chest Pain    [name] is a 64 y.o. male.  HPI   Patient states he was awakened from sleep at 2 AM this morning with lower central chest and upper abdominal pain.  He describes it as a dull hard pain.  The pain has been there constantly.  Nothing he does makes it hurt more, nothing he does makes it feel better.  He has had nausea with vomiting once, he denies diaphoresis or radiation of the pain.  He states he feels short of breath and it feels different from his COPD.  Patient states he has never had this pain before.  He denies any change of his food or his activity.  He states he drinks a box of wine which holds 34 glasses in a week.  He states his father died of an MI.  Patient had total hip replacement done on January 22 by Dr. Ihor Gully.  He also has a history of factor V Leiden and is followed by Dr. Delton Coombes.  He was last seen on February 10.  Dr. Delton Coombes is following his D-dimers.  They are around 2.  PCP [name], MD   Past Medical History:  Diagnosis Date  . Bilateral shoulder bursitis   . Chronic back pain   . COPD (chronic obstructive pulmonary disease) with emphysema ([place])   . DDD (degenerative disc disease), lumbar    hx epideral injection  L5--S1   . DVT (deep venous thrombosis) ([place]) 11/19/2018  . Dysphasia    intermittant w/ food   . Dyspnea   . Dyspnea on exertion   . GERD (gastroesophageal reflux disease)   . Gout    L great toe  . Hiatal hernia   . Hip problem 2020   left, seeing orthopedics  . History of acute pancreatitis    alcoholic pancreatitis [id number] and 01-30-2012  . History of colon polyps    10-17-2005  hyperplastic polyp's  . History of esophageal stricture    s/p  dilation 02-02-2017  . History of peptic ulcer 1990s  . History of pneumococcal  pneumonia 2006   bilateral pneumonia w/ left lower lobe abscess treated w/ chest tube with suction for drainage  . History of pneumothorax    1984--  left spontaneous pneumothorax ,  treated w/ chest tube  . Hypertension   . Malignant neoplasm of prostate [facility]) urologist-  dr dahlstedt/  oncologist -- dr Tammi Klippel   dx 12-02-2016-- Stage T2b, Gleason 3+4,  PSA 5.99,  vol 25cc  . Numbness of left foot    outer left ankle numb-- per pt has appt. w/ neurologist  . OSA (obstructive sleep apnea)    per pt has not used cpap over year ago from 04-16-2017--- per study 01-17-2010  severe osa  . Solid nodule of lung greater than 8 mm in diameter 05/11/2018   03/2018: RUL Nodule, 33mm  . Wears glasses     Patient Active Problem List   Diagnosis Date Noted  . Avascular necrosis of femur ([place]) 01/05/2019  . Lumbar radiculopathy 01/05/2019  . History of colonic polyps 08/19/2018  . Esophageal stenosis 08/19/2018  . DVT (deep venous thrombosis) ([place]) 05/11/2018  . Solid nodule of lung greater than 8 mm in diameter 05/11/2018  . Abnormal CT scan, colon 04/13/2018  . Obstructive  sleep apnea treated with continuous positive airway pressure (CPAP) 09/29/2017  . GERD (gastroesophageal reflux disease) 04/07/2017  . Dysphagia 01/29/2017  . Malignant neoplasm of prostate ([place]) 01/19/2017  . Pancreatitis, alcoholic 0000000  . Alcoholism ([place]) 11/11/2011  . Tobacco abuse 11/11/2011  . Thrombocytopenia ([place]) 11/11/2011  . HYPERSOMNIA, ASSOCIATED WITH SLEEP APNEA 09/27/2009  . COPD (chronic obstructive pulmonary disease) ([place]) 09/07/2009  . Pneumothorax 09/07/2009  . BURSITIS 09/07/2009  . FATIGUE / MALAISE 09/07/2009  . SHORTNESS OF BREATH 09/07/2009  . PNEUMOTHORAX 09/07/2009    Past Surgical History:  Procedure Laterality Date  . BIOPSY  02/02/2017   Procedure: BIOPSY;  Surgeon: Daneil Dolin, MD;  [facility];  Service: Endoscopy;;  duodenal bx's, esophgeal bx's  . BIOPSY   07/30/2017   Procedure: BIOPSY;  Surgeon: Daneil Dolin, MD;  Location[facility];  Service: Endoscopy;;  esophagus  . CARDIOVASCULAR STRESS TEST  09/12/2009   normal nuclear perfusion study w/ no ischemia /  normal LV function and wall motion , ef 57%  . COLONOSCOPY  2007   Dr. Gala Romney: hyperplastic polyps, internal hemorrhoids   . COLONOSCOPY WITH PROPOFOL N/A 05/03/2018   Dr. Gala Romney: diverticulosis in entire colon, multiple polyps in sigmoid, at splenic flexure, and ascending colon, not all polyps removed. Tubular adenomas and inflammatory polyps. Needs 3 year surveilance  . EPIDURAL BLOCK INJECTION  2020  . ESOPHAGOGASTRODUODENOSCOPY (EGD) WITH PROPOFOL N/A 02/02/2017   Dr. Gala Romney: esophageal stenosis s/p dilation and biopsy, medium sized hiatal hernia, normal duodenum  . ESOPHAGOGASTRODUODENOSCOPY (EGD) WITH PROPOFOL N/A 07/30/2017   Dr. Gala Romney: esophageal stenosis s/p dilation and biopsy, medium-sized hiatal hernia, normal duodenum  . HEMORRHOID SURGERY  01-21-2008    [facility]  . MALONEY DILATION N/A 02/02/2017   Procedure: Venia Minks DILATION;  Surgeon: Daneil Dolin, MD;  [facility];  Service: Endoscopy;  Laterality: N/A;  . Venia Minks DILATION N/A 07/30/2017   Procedure: Venia Minks DILATION;  Surgeon: Daneil Dolin, MD;  [facility];  Service: Endoscopy;  Laterality: N/A;  . POLYPECTOMY  05/03/2018   Procedure: POLYPECTOMY;  Surgeon: Daneil Dolin, MD;  [facility];  Service: Endoscopy;;  ascending colon polyp, sigmoid colon polyps (multiple)  . RADIOACTIVE SEED IMPLANT N/A 04/23/2017   Procedure: RADIOACTIVE SEED IMPLANT/BRACHYTHERAPY IMPLANT;  Surgeon: Franchot Gallo, MD;  Location[facility];  Service: Urology;  Laterality: N/A;  . SHOULDER ARTHROSCOPY  08/05/2003  . SPACE OAR INSTILLATION N/A 04/23/2017   Procedure: SPACE OAR INSTILLATION;  Surgeon: Franchot Gallo, MD;  Location: [facility];  Service: Urology;   Laterality: N/A;  . TRANSTHORACIC ECHOCARDIOGRAM  03/28/2009   mild LVH, ef 55-60%/ mild aorta calcification  . VASECTOMY  08/05/1983  . VIDEO ASSISTED THORACOSCOPY (VATS)/DECORTICATION Left        Family History  Problem Relation Age of Onset  . Diabetes Mother   . Heart disease Father   . Heart disease Brother   . Cancer Neg Hx   . Colon cancer Neg Hx   . Colon polyps Neg Hx   . Neuropathy Neg Hx     Social History   Tobacco Use  . Smoking status: Former Smoker    Packs/day: 1.00    Years: 30.00    Pack years: 30.00    Types: Cigarettes, E-cigarettes    Quit date: 10/03/2015    Years since quitting: 4.0  . Smokeless tobacco: Never Used  . Tobacco comment: stoped vaping  2018  Substance Use Topics  . Alcohol use: Yes    Alcohol/week: 21.0 standard drinks    Types: 21 Shots of liquor per week    Comment: 2-3 drinks daily (Wine); 01/04/19 pt states he hasn't had anything to drink in 2-3 months  . Drug use: No    Home Medications Prior to Admission medications   Medication Sig Start Date End Date Taking? Authorizing Provider  acetaminophen (TYLENOL) 500 MG tablet Take 500 mg by mouth every 6 (six) hours as needed.    [provider]  albuterol (PROVENTIL HFA;VENTOLIN HFA) 108 (90 Base) MCG/ACT inhaler Inhale 2 puffs into the lungs every 4 (four) hours as needed for wheezing or shortness of breath. 03/22/18   Icard, Leory Plowman L, DO  albuterol (PROVENTIL) (2.5 MG/3ML) 0.083% nebulizer solution Take 3 mLs (2.5 mg total) by nebulization every 6 (six) hours as needed for wheezing or shortness of breath. 03/22/18   Garner Nash, DO  aspirin EC 81 MG tablet Take 81 mg by mouth daily.    [provider]  diclofenac (VOLTAREN) 75 MG EC tablet diclofenac sodium 75 mg tablet,delayed release  TAKE 1 TABLET BY MOUTH TWICE DAILY WITH MEALS    [provider]  doxycycline (VIBRAMYCIN) 50 MG capsule Take 50 mg by mouth daily. 05/24/19   [provider]    Fluticasone-Umeclidin-Vilant (TRELEGY ELLIPTA) 100-62.5-25 MCG/INH AEPB Inhale 1 puff into the lungs daily. 08/16/19   Icard, Octavio Graves, DO  HYDROcodone-acetaminophen (NORCO/VICODIN) 5-325 MG tablet hydrocodone 5 mg-acetaminophen 325 mg tablet  TAKE 1 TO 2 TABLETS BY MOUTH EVERY 4 HOURS AS NEEDED FOR PAIN    [provider]  methocarbamol (ROBAXIN) 500 MG tablet methocarbamol 500 mg tablet  TAKE 1 TABLET BY MOUTH EVERY 6 HOURS AS NEEDED FOR SPASM    [provider]  pantoprazole (PROTONIX) 40 MG tablet TAKE 1 TABLET(40 MG) BY MOUTH DAILY 30 MINUTES BEFORE BREAKFAST 05/18/19   Aliene Altes S, PA-C  polyethylene glycol powder (MIRALAX) 17 GM/SCOOP powder Miralax 17 gram oral powder packet  Take 1 packet every day by oral route.    [provider]    Allergies    Patient has no known allergies.  Review of Systems   Review of Systems  All other systems reviewed and are negative.   Physical Exam Updated Vital Signs BP (!) 129/102   Pulse 96   Temp 98.5 F (36.9 C) (Oral)   Resp 17   Ht 6\' 2"  (1.88 m)   Wt 104.3 kg   SpO2 93%   BMI 29.53 kg/m   Vital signs normal except for diastolic hypertension   Physical Exam Vitals and nursing note reviewed.  Constitutional:      General: He is not in acute distress.    Appearance: Normal appearance. He is well-developed. He is not ill-appearing or toxic-appearing.     Comments: Patient groaning out at times  HENT:     Head: Normocephalic and atraumatic.     Right Ear: External ear normal.     Left Ear: External ear normal.     Nose: Nose normal. No mucosal edema or rhinorrhea.     Mouth/Throat:     Dentition: No dental abscesses.     Pharynx: No uvula swelling.  Eyes:     Extraocular Movements: Extraocular movements intact.     Conjunctiva/sclera: Conjunctivae normal.     Pupils: Pupils are equal, round, and reactive to light.  Cardiovascular:     Rate  and Rhythm: Normal rate and regular rhythm.      Heart sounds: Normal heart sounds. No murmur. No friction rub. No gallop.   Pulmonary:     Effort: Pulmonary effort is normal. No respiratory distress.     Breath sounds: Normal breath sounds. No wheezing, rhonchi or rales.  Chest:     Chest wall: No tenderness or crepitus.       Comments: Area of pain noted Abdominal:     General: Bowel sounds are normal. There is no distension.     Palpations: Abdomen is soft.     Tenderness: There is abdominal tenderness. There is no guarding or rebound.    Musculoskeletal:        General: No tenderness. Normal range of motion.     Cervical back: Full passive range of motion without pain, normal range of motion and neck supple.     Right lower leg: No edema.     Left lower leg: No edema.     Comments: Moves all extremities well.   Skin:    General: Skin is warm and dry.     Coloration: Skin is not pale.     Findings: No erythema or rash.     Comments: Patient skin of his lower extremities appears thickened and wrinkled with some hyperpigmentation changes.  Neurological:     General: No focal deficit present.     Mental Status: He is alert and oriented to person, place, and time.     Cranial Nerves: No cranial nerve deficit.  Psychiatric:        Mood and Affect: Mood normal. Mood is not anxious.        Speech: Speech normal.        Behavior: Behavior normal.        Thought Content: Thought content normal.     ED Results / Procedures / Treatments   Labs (all labs ordered are listed, but only abnormal results are displayed) Results for orders placed or performed during the hospital encounter of 10/10/19  Comprehensive metabolic panel  Result Value Ref Range   Sodium 139 135 - 145 mmol/L   Potassium 4.1 3.5 - 5.1 mmol/L   Chloride 99 98 - 111 mmol/L   CO2 26 22 - 32 mmol/L   Glucose, Bld 151 (H) 70 - 99 mg/dL   BUN 16 8 - 23 mg/dL   Creatinine, Ser 0.94 0.61 - 1.24 mg/dL   Calcium 9.3 8.9 - 10.3 mg/dL   Total Protein 7.8 6.5 - 8.1  g/dL   Albumin 4.3 3.5 - 5.0 g/dL   AST 28 15 - 41 U/L   ALT 26 0 - 44 U/L   Alkaline Phosphatase 79 38 - 126 U/L   Total Bilirubin 0.8 0.3 - 1.2 mg/dL   GFR calc non Af Amer >60 >60 mL/min   GFR calc Af Amer >60 >60 mL/min   Anion gap 14 5 - 15  Ethanol  Result Value Ref Range   Alcohol, Ethyl (B) 187 (H) <10 mg/dL  Lipase, blood  Result Value Ref Range   Lipase 171 (H) 11 - 51 U/L  CBC with Differential  Result Value Ref Range   WBC 11.1 (H) 4.0 - 10.5 K/uL   RBC 5.03 4.22 - 5.81 MIL/uL   Hemoglobin 17.0 13.0 - 17.0 g/dL   HCT 50.2 39.0 - 52.0 %   MCV 99.8 80.0 - 100.0 fL   MCH 33.8 26.0 - 34.0 pg   MCHC 33.9  30.0 - 36.0 g/dL   RDW 12.7 11.5 - 15.5 %   Platelets 175 150 - 400 K/uL   nRBC 0.0 0.0 - 0.2 %   Neutrophils Relative % 70 %   Neutro Abs 7.9 (H) 1.7 - 7.7 K/uL   Lymphocytes Relative 21 %   Lymphs Abs 2.3 0.7 - 4.0 K/uL   Monocytes Relative 6 %   Monocytes Absolute 0.7 0.1 - 1.0 K/uL   Eosinophils Relative 1 %   Eosinophils Absolute 0.1 0.0 - 0.5 K/uL   Basophils Relative 1 %   Basophils Absolute 0.1 0.0 - 0.1 K/uL   Immature Granulocytes 1 %   Abs Immature Granulocytes 0.06 0.00 - 0.07 K/uL  Troponin I (High Sensitivity)  Result Value Ref Range   Troponin I (High Sensitivity) 4 <18 ng/L   Laboratory interpretation all normal except elevated lipase, hyperglycemia, alcohol intoxication, leukocytosis    EKG EKG Interpretation  Date/Time:  Monday October 10 2019 05:36:39 EST Ventricular Rate:  85 PR Interval:    QRS Duration: 91 QT Interval:  355 QTC Calculation: 423 R Axis:   13 Text Interpretation: Sinus rhythm Low voltage, precordial leads Abnormal R-wave progression, early transition No significant change since last tracing 23 Apr 2018 Confirmed by Rolland Porter [id number]) on 10/10/2019 5:43:45 AM   Radiology CT Abdomen Pelvis W Contrast  Result Date: 10/10/2019 CLINICAL DATA:  Nausea vomiting since 2 a.m. Epigastric abdominal pain. EXAM: CT ABDOMEN AND  PELVIS WITH CONTRAST TECHNIQUE: Multidetector CT imaging of the abdomen and pelvis was performed using the standard protocol following bolus administration of intravenous contrast. CONTRAST:  127mL OMNIPAQUE IOHEXOL 300 MG/ML  SOLN COMPARISON:  Chest CT, 08/03/2019.  Abdomen ultrasound, 03/18/2019. FINDINGS: Lower chest: No acute abnormality. Hepatobiliary: Diffuse decreased attenuation consistent with fatty infiltration. Normal in size. Small left lobe. No mass or focal lesion. Normal gallbladder. No bile duct dilation. Pancreas: Hazy inflammatory change surrounds pancreas. No pancreatic mass or peripancreatic fluid collection. There is homogeneous pancreatic enhancement. No duct dilation. Spleen: Normal in size without focal abnormality. Adrenals/Urinary Tract: No adrenal masses. Kidneys normal in size, orientation and position with symmetric enhancement and excretion. 2.5 cm cyst, lower pole the right kidney, with a 5 mm adjacent hypoattenuating lesion, too small to characterize but also likely a cyst. 1.6 cm midpole cyst on the left. No other renal masses, no stones and no hydronephrosis. Ureters normal in course and in caliber. Bladder is unremarkable. Stomach/Bowel: Small hiatal hernia. Stomach otherwise unremarkable. Small bowel is normal in caliber. No wall thickening or inflammation. Colon is normal in caliber. There are numerous diverticula mostly along the sigmoid, without evidence of diverticulitis. No colonic wall thickening or other inflammatory process. Normal appendix visualized. Vascular/Lymphatic: Portal vein, superior mesenteric vein and splenic vein are widely patent. Aortic atherosclerosis. No aneurysm. No enlarged lymph nodes. Reproductive: Radiation therapy seeds project in the prostate. No prosthetic enlargement. Other: Small umbilical hernia. A knuckle of small bowel slightly enters the hernia. Musculoskeletal: Bilateral total hip arthroplasties appear well seated and well aligned. No  fracture or acute finding. No osteoblastic or osteolytic lesions. IMPRESSION: 1. Acute uncomplicated pancreatitis. No evidence of pancreatic necrosis or of an abscess or pseudocyst. No venous thrombosis. 2. Hepatic steatosis. 3. Aortic atherosclerosis. 4. Colonic diverticulosis without evidence of diverticulitis. Electronically Signed   By: Lajean Manes M.D.   On: 10/10/2019 07:14    Procedures Procedures (including critical care time)  Medications Ordered in ED Medications  fentaNYL (SUBLIMAZE) injection 50 mcg (has no  administration in time range)  ondansetron (ZOFRAN) injection 4 mg (has no administration in time range)  sodium chloride 0.9 % bolus 1,000 mL (1,000 mLs Intravenous New Bag/Given 10/10/19 0553)  ondansetron (ZOFRAN) injection 4 mg (4 mg Intravenous Given 10/10/19 0552)  morphine 4 MG/ML injection 4 mg (4 mg Intravenous Given 10/10/19 0553)  fentaNYL (SUBLIMAZE) injection 50 mcg (50 mcg Intravenous Given 10/10/19 0643)  iohexol (OMNIPAQUE) 300 MG/ML solution 100 mL (100 mLs Intravenous Contrast Given 10/10/19 [id number])    ED Course  I have reviewed the triage vital signs and the nursing notes.  Pertinent labs & imaging results that were available during my care of the patient were reviewed by me and considered in my medical decision making (see chart for details).    MDM Rules/Calculators/A&P                     Patient was given IV fluids and IV pain and nausea medication.  Laboratory testing was done.  6:20 AM I went in to talk to patient about his test results that were back so far, his lipase is elevated consistent with pancreatitis.  When I review his prior lipases they have been as high as 700 when he had his acute pancreatitis.  However his lipase was normal 1 year ago and in August.  He states his pain is mildly better but still present.  He was given additional IV pain medication.  We discussed getting a CT of the abdomen pelvis to see how bad his pancreas is inflamed.  7:10  AM patient returned from CT and requesting more pain and nausea medicine.  7:20 AM I went to talk to the patient about his CT results.  We had a discussion about outpatient treatment or inpatient treatment, he has required pain medications 3 times in the past 2 hours, we also discussed eating would probably make the pain worse, he is agreeable to being admitted.  His delta troponin is still pending however I suspect it will probably be normal.  7:32 AM Dr. Dyann Kief, hospitalist will admit  Final Clinical Impression(s) / ED Diagnoses Final diagnoses:  Chest pain, unspecified type  Epigastric pain  Acute pancreatitis, unspecified complication status, unspecified pancreatitis type  Alcoholic intoxication without complication [facility])    Rx / DC Orders   Plan admission  Rolland Porter, MD, Barbette Or, MD 10/10/19 [phone]

## 2019-10-10 NOTE — TOC Initial Note (Signed)
Transition of Care [facility]) - Initial/Assessment Note    Patient Details  Name: [name] MRN: [id number] Date of Birth: Jun 03, 1956  [facility]) CM/SW Contact:    Boneta Lucks, RN Phone Number: 10/10/2019, 1:53 PM  Clinical Narrative:        Patient admitted with acute Alcoholic pancreatitis. MD put in consult for SA resources. Patient states he went years ago. He is agreeable to trying again.  Discussed AA and outpatient treatment plans in [place].  Copy of resources taken to patient.  No other questions for TOC.           Expected Discharge Plan: Home/Self Care Barriers to Discharge: Continued Medical Work up   Patient Goals and CMS Choice Patient states their goals for this hospitalization and ongoing recovery are:: to go home. CMS Medicare.gov Compare Post Acute Care list provided to:: Patient    Expected Discharge Plan and Services Expected Discharge Plan: Home/Self Care       Activities of Daily Living Home Assistive Devices/Equipment: Environmental consultant (specify type), Cane (specify quad or straight), Eyeglasses, CPAP ADL Screening (condition at time of admission) Patient's cognitive ability adequate to safely complete daily activities?: Yes Is the patient deaf or have difficulty hearing?: No Does the patient have difficulty seeing, even when wearing glasses/contacts?: No Does the patient have difficulty concentrating, remembering, or making decisions?: No Patient able to express need for assistance with ADLs?: Yes Does the patient have difficulty dressing or bathing?: No Independently performs ADLs?: Yes (appropriate for developmental age) Does the patient have difficulty walking or climbing stairs?: No Weakness of Legs: None Weakness of Arms/Hands: None  Permission Sought/Granted Permission sought to share information with : Case Manager      Emotional Assessment     Affect (typically observed): Accepting, Pleasant Orientation: : Oriented to Self,  Oriented to Place, Oriented to  Time, Oriented to Situation Alcohol / Substance Use: Alcohol Use Psych Involvement: No (comment)  Admission diagnosis:  Epigastric pain 0000000 Acute alcoholic pancreatitis 99991111 Alcoholic intoxication without complication ([name]) 123456 Chest pain, unspecified type [R07.9] Acute pancreatitis, unspecified complication status, unspecified pancreatitis type [K85.90] Patient Active Problem List   Diagnosis Date Noted  . Acute alcoholic pancreatitis 123456  . Avascular necrosis of femur ([place]) 01/05/2019  . Lumbar radiculopathy 01/05/2019  . History of colonic polyps 08/19/2018  . Esophageal stenosis 08/19/2018  . DVT (deep venous thrombosis) ([place]) 05/11/2018  . Solid nodule of lung greater than 8 mm in diameter 05/11/2018  . Abnormal CT scan, colon 04/13/2018  . Obstructive sleep apnea treated with continuous positive airway pressure (CPAP) 09/29/2017  . GERD (gastroesophageal reflux disease) 04/07/2017  . Dysphagia 01/29/2017  . Malignant neoplasm of prostate ([place]) 01/19/2017  . Pancreatitis, alcoholic 0000000  . Alcoholism ([place]) 11/11/2011  . Tobacco abuse 11/11/2011  . Thrombocytopenia ([place]) 11/11/2011  . HYPERSOMNIA, ASSOCIATED WITH SLEEP APNEA 09/27/2009  . COPD (chronic obstructive pulmonary disease) ([place]) 09/07/2009  . Pneumothorax 09/07/2009  . BURSITIS 09/07/2009  . FATIGUE / MALAISE 09/07/2009  . SHORTNESS OF BREATH 09/07/2009  . PNEUMOTHORAX 09/07/2009   PCP:  [name], MD Pharmacy:   [facility] [place] [place] [address] Phone: [phone] Fax: [phone]

## 2019-10-10 NOTE — ED Triage Notes (Signed)
Pt c/o chest pain since 0200 this morning. Pt states he thought it was GERD, he took 4 tums with no relief. Pt denies hx of same. Pt states that he has a hx of blood clots.

## 2019-10-10 NOTE — ED Notes (Signed)
Patient transport for CT scan.

## 2019-10-10 NOTE — H&P (Signed)
History and Physical    [name] [id number] DOB: 23-Jan-1956 DOA: 10/10/2019  Referring MD/NP/PA: Dr. Tomi Bamberger PCP: [name], MD  Patient coming from: Home  Chief Complaint: Midepigastric/lower central chest discomfort; nausea/vomiting.  HPI: TAHMID [name] is a 64 y.o. male with past medical history significant for COPD/asthma, gastroesophageal reflux disease, alcohol abuse and prior history of alcoholic pancreatitis; who presented to the hospital secondary to mid epigastric and lower central chest pain for approximately 6-8 hours prior to visiting emergency department.  Patient reports pain started suddenly, located in the mid epigastric area lower central chest, not radiated, 8 out of 10 in intensity and associated with nausea and vomiting (2 episodes of vomiting so far, no blood in his vomit content).  Patient reports never experiencing discussion of pain before and expressed no aggravating or alleviating factors.  As per his story no changes in his diet lately and have been compliant with his medications.  No productive cough, no fever, no headaches, no blurred vision, no dysuria, no hematuria, no focal weakness, no melena, no hematochezia or any other complaints.  Of note, patient reports drinking approximately 30 to 50 glasses of wine throughout the week.  In the ED work-up demonstrated elevated lipase and CT abdomen and pelvis with findings consistently for acute uncomplicated pancreatitis.  IV fluids, pain medications and antiemetics has been provided TRH consulted to admit patient for further evaluation and management.  The rest of his blood work was essentially unremarkable, EKG without acute on normalities and negative troponin.  Past Medical/Surgical History: Past Medical History:  Diagnosis Date  . Bilateral shoulder bursitis   . Chronic back pain   . COPD (chronic obstructive pulmonary disease) with emphysema ([place])   . DDD (degenerative disc disease), lumbar    hx epideral injection  L5--S1   . DVT (deep venous thrombosis) ([place]) 11/19/2018  . Dysphasia    intermittant w/ food   . Dyspnea   . Dyspnea on exertion   . GERD (gastroesophageal reflux disease)   . Gout    L great toe  . Hiatal hernia   . Hip problem 2020   left, seeing orthopedics  . History of acute pancreatitis    alcoholic pancreatitis 99991111 and 01-30-2012  . History of colon polyps    10-17-2005  hyperplastic polyp's  . History of esophageal stricture    s/p  dilation 02-02-2017  . History of peptic ulcer 1990s  . History of pneumococcal pneumonia 2006   bilateral pneumonia w/ left lower lobe abscess treated w/ chest tube with suction for drainage  . History of pneumothorax    1984--  left spontaneous pneumothorax ,  treated w/ chest tube  . Hypertension   . Malignant neoplasm of prostate [facility]) urologist-  dr dahlstedt/  oncologist -- dr Tammi Klippel   dx 12-02-2016-- Stage T2b, Gleason 3+4,  PSA 5.99,  vol 25cc  . Numbness of left foot    outer left ankle numb-- per pt has appt. w/ neurologist  . OSA (obstructive sleep apnea)    per pt has not used cpap over year ago from 04-16-2017--- per study 01-17-2010  severe osa  . Solid nodule of lung greater than 8 mm in diameter 05/11/2018   03/2018: RUL Nodule, 71mm  . Wears glasses     Past Surgical History:  Procedure Laterality Date  . BIOPSY  02/02/2017   Procedure: BIOPSY;  Surgeon: Daneil Dolin, MD;  [facility];  Service: Endoscopy;;  duodenal bx's, esophgeal  bx's  . BIOPSY  07/30/2017   Procedure: BIOPSY;  Surgeon: Daneil Dolin, MD;  [facility];  Service: Endoscopy;;  esophagus  . CARDIOVASCULAR STRESS TEST  09/12/2009   normal nuclear perfusion study w/ no ischemia /  normal LV function and wall motion , ef 57%  . COLONOSCOPY  2007   Dr. Gala Romney: hyperplastic polyps, internal hemorrhoids   . COLONOSCOPY WITH PROPOFOL N/A 05/03/2018   Dr. Gala Romney: diverticulosis in entire colon, multiple  polyps in sigmoid, at splenic flexure, and ascending colon, not all polyps removed. Tubular adenomas and inflammatory polyps. Needs 3 year surveilance  . EPIDURAL BLOCK INJECTION  2020  . ESOPHAGOGASTRODUODENOSCOPY (EGD) WITH PROPOFOL N/A 02/02/2017   Dr. Gala Romney: esophageal stenosis s/p dilation and biopsy, medium sized hiatal hernia, normal duodenum  . ESOPHAGOGASTRODUODENOSCOPY (EGD) WITH PROPOFOL N/A 07/30/2017   Dr. Gala Romney: esophageal stenosis s/p dilation and biopsy, medium-sized hiatal hernia, normal duodenum  . HEMORRHOID SURGERY  01-21-2008    [facility]  . MALONEY DILATION N/A 02/02/2017   Procedure: Venia Minks DILATION;  Surgeon: Daneil Dolin, MD;  [facility];  Service: Endoscopy;  Laterality: N/A;  . Venia Minks DILATION N/A 07/30/2017   Procedure: Venia Minks DILATION;  Surgeon: Daneil Dolin, MD;  [facility];  Service: Endoscopy;  Laterality: N/A;  . POLYPECTOMY  05/03/2018   Procedure: POLYPECTOMY;  Surgeon: Daneil Dolin, MD;  [facility];  Service: Endoscopy;;  ascending colon polyp, sigmoid colon polyps (multiple)  . RADIOACTIVE SEED IMPLANT N/A 04/23/2017   Procedure: RADIOACTIVE SEED IMPLANT/BRACHYTHERAPY IMPLANT;  Surgeon: Franchot Gallo, MD;  Location: [facility];  Service: Urology;  Laterality: N/A;  . SHOULDER ARTHROSCOPY  08/05/2003  . SPACE OAR INSTILLATION N/A 04/23/2017   Procedure: SPACE OAR INSTILLATION;  Surgeon: Franchot Gallo, MD;  Location: [facility];  Service: Urology;  Laterality: N/A;  . TRANSTHORACIC ECHOCARDIOGRAM  03/28/2009   mild LVH, ef 55-60%/ mild aorta calcification  . VASECTOMY  08/05/1983  . VIDEO ASSISTED THORACOSCOPY (VATS)/DECORTICATION Left     Social History:  reports that he quit smoking about 4 years ago. His smoking use included cigarettes and e-cigarettes. He has a 30.00 pack-year smoking history. He has never used smokeless tobacco. He reports current alcohol use of  about 21.0 standard drinks of alcohol per week. He reports that he does not use drugs.  Allergies: No Known Allergies  Family History:  Family History  Problem Relation Age of Onset  . Diabetes Mother   . Heart disease Father   . Heart disease Brother   . Cancer Neg Hx   . Colon cancer Neg Hx   . Colon polyps Neg Hx   . Neuropathy Neg Hx     Prior to Admission medications   Medication Sig Start Date End Date Taking? Authorizing Provider  acetaminophen (TYLENOL) 500 MG tablet Take 500 mg by mouth every 6 (six) hours as needed for mild pain or headache.    Yes [provider]  albuterol (PROVENTIL HFA;VENTOLIN HFA) 108 (90 Base) MCG/ACT inhaler Inhale 2 puffs into the lungs every 4 (four) hours as needed for wheezing or shortness of breath. 03/22/18  Yes Icard, Bradley L, DO  albuterol (PROVENTIL) (2.5 MG/3ML) 0.083% nebulizer solution Take 3 mLs (2.5 mg total) by nebulization every 6 (six) hours as needed for wheezing or shortness of breath. 03/22/18  Yes Icard, Octavio Graves, DO  aspirin EC 81 MG tablet Take 81 mg by mouth daily.  Yes [provider]  doxycycline (VIBRAMYCIN) 50 MG capsule Take 50 mg by mouth daily. 05/24/19  Yes [provider]  Fluticasone-Umeclidin-Vilant (TRELEGY ELLIPTA) 100-62.5-25 MCG/INH AEPB Inhale 1 puff into the lungs daily. 08/16/19  Yes Icard, Bradley L, DO  pantoprazole (PROTONIX) 40 MG tablet TAKE 1 TABLET(40 MG) BY MOUTH DAILY 30 MINUTES BEFORE BREAKFAST Patient taking differently: Take 40 mg by mouth daily.  05/18/19  Yes Erenest Rasher, PA-C    Review of Systems:  Negative except as otherwise mentioned in HPI.   Physical Exam: Vitals:   10/10/19 0900 10/10/19 0930 10/10/19 0950 10/10/19 1629  BP: (!) 124/97 (!) 140/104 (!) 168/98 (!) 169/97  Pulse: (!) 103 (!) 107 (!) 103 (!) 125  Resp: (!) 23 14 20 20   Temp:   98 F (36.7 C) 98.2 F (36.8 C)  TempSrc:   Oral Oral  SpO2: (!) 85% 92% 100% 91%  Weight:      Height:         Constitutional: Mild distress secondary to epigastric/mid abdomen pain and active nausea.  No shortness of breath, no further episode of vomiting.  Patient is afebrile. Eyes: PERRL, lids and conjunctivae normal, no icterus, no nystagmus. ENMT: Mucous membranes are moist. Posterior pharynx clear of any exudate or lesions. Normal dentition.  Neck: normal, supple, no masses, no thyromegaly Respiratory: clear to auscultation bilaterally, no wheezing, no crackles. Normal respiratory effort. No accessory muscle use.  Cardiovascular: Sinus tachycardia, no murmurs, rubs or gallops. No extremity edema. 2+ pedal pulses. No carotid bruits.  Abdomen: no tenderness, no masses palpated. No hepatosplenomegaly. Bowel sounds positive.  Musculoskeletal: no clubbing / cyanosis. No joint deformity upper and lower extremities. Good ROM, no contractures. Normal muscle tone.  Skin: no rashes, lesions, ulcers. No induration Neurologic: CN 2-12 grossly intact. Sensation intact, DTR normal. Strength 5/5 in all 4.  Psychiatric: Normal judgment and insight. Alert and oriented x 3. Normal mood.    Labs on Admission: I have personally reviewed the following labs and imaging studies  CBC: Recent Labs  Lab 10/10/19 0544  WBC 11.1*  NEUTROABS 7.9*  HGB 17.0  HCT 50.2  MCV 99.8  PLT 0000000   Basic Metabolic Panel: Recent Labs  Lab 10/10/19 0544 10/10/19 0801  NA 139  --   K 4.1  --   CL 99  --   CO2 26  --   GLUCOSE 151*  --   BUN 16  --   CREATININE 0.94  --   CALCIUM 9.3  --   PHOS  --  2.2*   GFR: Estimated Creatinine Clearance: 104.9 mL/min (by C-G formula based on SCr of 0.94 mg/dL).   Liver Function Tests: Recent Labs  Lab 10/10/19 0544  AST 28  ALT 26  ALKPHOS 79  BILITOT 0.8  PROT 7.8  ALBUMIN 4.3   Recent Labs  Lab 10/10/19 0544  LIPASE 171*   HbA1C: Recent Labs    10/10/19 0544  HGBA1C 5.3   Thyroid Function Tests: Recent Labs    10/10/19 0544  TSH 1.947   Anemia  Panel: Recent Labs    10/10/19 0544  VITAMINB12 226   Urine analysis:    Component Value Date/Time   COLORURINE YELLOW 01/30/2012 0120   APPEARANCEUR CLEAR 01/30/2012 0120   LABSPEC 1.020 01/30/2012 0120   PHURINE 6.0 01/30/2012 0120   GLUCOSEU NEGATIVE 01/30/2012 0120   HGBUR NEGATIVE 01/30/2012 0120   BILIRUBINUR NEGATIVE 01/30/2012 0120   KETONESUR 15 (A) 01/30/2012  [place] 01/30/2012 0120   UROBILINOGEN 0.2 01/30/2012 0120   NITRITE NEGATIVE 01/30/2012 0120   LEUKOCYTESUR NEGATIVE 01/30/2012 0120    Radiological Exams on Admission: CT Abdomen Pelvis W Contrast  Result Date: 10/10/2019 CLINICAL DATA:  Nausea vomiting since 2 a.m. Epigastric abdominal pain. EXAM: CT ABDOMEN AND PELVIS WITH CONTRAST TECHNIQUE: Multidetector CT imaging of the abdomen and pelvis was performed using the standard protocol following bolus administration of intravenous contrast. CONTRAST:  180mL OMNIPAQUE IOHEXOL 300 MG/ML  SOLN COMPARISON:  Chest CT, 08/03/2019.  Abdomen ultrasound, 03/18/2019. FINDINGS: Lower chest: No acute abnormality. Hepatobiliary: Diffuse decreased attenuation consistent with fatty infiltration. Normal in size. Small left lobe. No mass or focal lesion. Normal gallbladder. No bile duct dilation. Pancreas: Hazy inflammatory change surrounds pancreas. No pancreatic mass or peripancreatic fluid collection. There is homogeneous pancreatic enhancement. No duct dilation. Spleen: Normal in size without focal abnormality. Adrenals/Urinary Tract: No adrenal masses. Kidneys normal in size, orientation and position with symmetric enhancement and excretion. 2.5 cm cyst, lower pole the right kidney, with a 5 mm adjacent hypoattenuating lesion, too small to characterize but also likely a cyst. 1.6 cm midpole cyst on the left. No other renal masses, no stones and no hydronephrosis. Ureters normal in course and in caliber. Bladder is unremarkable. Stomach/Bowel: Small hiatal hernia.  Stomach otherwise unremarkable. Small bowel is normal in caliber. No wall thickening or inflammation. Colon is normal in caliber. There are numerous diverticula mostly along the sigmoid, without evidence of diverticulitis. No colonic wall thickening or other inflammatory process. Normal appendix visualized. Vascular/Lymphatic: Portal vein, superior mesenteric vein and splenic vein are widely patent. Aortic atherosclerosis. No aneurysm. No enlarged lymph nodes. Reproductive: Radiation therapy seeds project in the prostate. No prosthetic enlargement. Other: Small umbilical hernia. A knuckle of small bowel slightly enters the hernia. Musculoskeletal: Bilateral total hip arthroplasties appear well seated and well aligned. No fracture or acute finding. No osteoblastic or osteolytic lesions. IMPRESSION: 1. Acute uncomplicated pancreatitis. No evidence of pancreatic necrosis or of an abscess or pseudocyst. No venous thrombosis. 2. Hepatic steatosis. 3. Aortic atherosclerosis. 4. Colonic diverticulosis without evidence of diverticulitis. Electronically Signed   By: Lajean Manes M.D.   On: 10/10/2019 07:14    EKG: Independently reviewed.  Sinus rhythm, no acute ischemic changes appreciated.  Normal axis.  Assessment/Plan 1-acute alcoholic pancreatitis -N.p.o. status for bowel rest -IV fluids, antiemetics and analgesics -Alcohol cessation counseling has been provided. -There is no signs of necrotizing changes or infections currently, so will Hold antibiotics. -Follow clinical response. -Repeat lipase level and LFTs in a.m.   2-alcohol abuse -Cessation counseling has been provided -Patient started on folic acid and thiamine -Will check B12 -CIWA protocol initiated. -Social worker consulted to provide resources that can assist patient with quitting alcohol abuse  3-gastroesophageal reflux disease/GI prophylaxis -Started on Protonix.  4-history of COPD/asthma -Stable and well-controlled -No wheezing  appreciated on exam -Will continue trilogy Ellipta and as needed bronchodilators.  5-elevated blood pressure -No prior history of hypertension -Most likely associated with acute ongoing pain -Will add as needed hydralazine for systolic blood pressure more than [id number] and or diastolic blood pressure more than 100. -Follow vital signs.  DVT prophylaxis: Lovenox Code Status: Full code Family Communication: No family at bedside.  Patient's questions and plan of care discussed in details to his satisfaction, he is in agreement and will like to proceed as plan.  Disposition Plan: Anticipate discharge back home once acute pancreatitis episodes controlled and patient  is able to tolerate p.o.'s. Consults called: None Admission status: Inpatient, length of stay more than 2 midnights; MedSurg bed.  Is my clinical judgment and impression that Mr. Kindig will require more than 2 midnights of hospitalization given acute presentation, inability to safely tolerate by mouth and requiring IV analgesics to safely control his symptoms.   Time Spent: 70 minutes.  Barton Dubois MD [facility] Pager [phone]  10/10/2019, 5:09 PM

## 2019-10-11 DIAGNOSIS — G4733 Obstructive sleep apnea (adult) (pediatric): Secondary | ICD-10-CM

## 2019-10-11 LAB — COMPREHENSIVE METABOLIC PANEL
ALT: 19 U/L (ref 0–44)
AST: 30 U/L (ref 15–41)
Albumin: 3.4 g/dL — ABNORMAL LOW (ref 3.5–5.0)
Alkaline Phosphatase: 56 U/L (ref 38–126)
Anion gap: 9 (ref 5–15)
BUN: 17 mg/dL (ref 8–23)
CO2: 26 mmol/L (ref 22–32)
Calcium: 8.5 mg/dL — ABNORMAL LOW (ref 8.9–10.3)
Chloride: 100 mmol/L (ref 98–111)
Creatinine, Ser: 0.81 mg/dL (ref 0.61–1.24)
GFR calc Af Amer: >60 mL/min (ref >60)
GFR calc non Af Amer: >60 mL/min (ref >60)
Glucose, Bld: 137 mg/dL — ABNORMAL HIGH (ref 70–99)
Potassium: 3.8 mmol/L (ref 3.5–5.1)
Sodium: 135 mmol/L (ref 135–145)
Total Bilirubin: 1.2 mg/dL (ref 0.3–1.2)
Total Protein: 6.4 g/dL — ABNORMAL LOW (ref 6.5–8.1)

## 2019-10-11 LAB — LIPASE, BLOOD: Lipase: 70 U/L — ABNORMAL HIGH (ref 11–51)

## 2019-10-11 MED ORDER — OXYCODONE HCL 5 MG PO TABS: 5.0000 mg | ORAL_TABLET | Freq: Four times a day (QID) | ORAL | Status: DC | PRN

## 2019-10-11 MED ORDER — VITAMIN B-12 1000 MCG PO TABS
1000.0000 ug | ORAL_TABLET | Freq: Every day | ORAL | Status: DC
  Administered 2019-10-11 – 2019-10-13 (×3): 1000 ug via ORAL
  Filled 2019-10-11 (×3): qty 1

## 2019-10-11 MED ORDER — MORPHINE SULFATE (PF) 4 MG/ML IV SOLN
4.0000 mg | INTRAVENOUS | Status: DC | PRN
  Administered 2019-10-12 (×2): 4 mg via INTRAVENOUS
  Filled 2019-10-11 (×2): qty 1

## 2019-10-11 NOTE — Progress Notes (Signed)
PROGRESS NOTE    [name]  [id number] DOB: 08/24/1955 DOA: 10/10/2019 PCP: [name], MD     Brief Narrative:  64 y.o. male with past medical history significant for COPD/asthma, gastroesophageal reflux disease, alcohol abuse and prior history of alcoholic pancreatitis; who presented to the hospital secondary to mid epigastric and lower central chest pain for approximately 6-8 hours prior to visiting emergency department.  Patient reports pain started suddenly, located in the mid epigastric area lower central chest, not radiated, 8 out of 10 in intensity and associated with nausea and vomiting (2 episodes of vomiting so far, no blood in his vomit content).  Patient reports never experiencing discussion of pain before and expressed no aggravating or alleviating factors.  As per his story no changes in his diet lately and have been compliant with his medications.  No productive cough, no fever, no headaches, no blurred vision, no dysuria, no hematuria, no focal weakness, no melena, no hematochezia or any other complaints.  Of note, patient reports drinking approximately 30 to 50 glasses of wine throughout the week.  In the ED work-up demonstrated elevated lipase and CT abdomen and pelvis with findings consistently for acute uncomplicated pancreatitis.  IV fluids, pain medications and antiemetics has been provided [facility] consulted to admit patient for further evaluation and management.  The rest of his blood work was essentially unremarkable, EKG without acute on normalities and negative troponin.   Assessment & Plan: 1-acute alcoholic pancreatitis -After bowel rest, IV fluids and supportive care patient expressed pain is better and is no longer experiencing nausea or vomiting. -Will start advancing diet to full liquids; assess tolerance can use oral pain medication -Decrease IV fluid rate, continue supportive care and as needed antiemetics. -Lipase is improving close to normal and  has remained with stable LFTs. -Alcohol cessation and encouragement to quit once again discussed with patient.  2-alcohol abuse -Cessation counseling was again provided -Continue folic acid and thiamine -Continue CIWA protocol monitoring -B12 borderline normal; start supplementation. -Appreciate social worker assist in providing resources to assist patient quitting alcohol abuse after discharge.  3-gastroesophageal reflux disease/GI prophylaxis -Continue PPI.  4-COPD/asthma -Stable and well-controlled -No wheezing appreciated on exam -Continue the use of Trelegy Ellipta and as needed bronchodilators -1 L of oxygen supplementation-started after receiving pain medication to guarantee O2 sat above 92%.  5-OSA -will resume home CPAP nightly   6-elevated blood pressure -No prior history of hypertension -Continue monitoring vital signs stable -continue as needed hydralazine.   DVT prophylaxis: Lovenox Code Status: full code. Family Communication: No family at bedside. Disposition Plan: Remains inpatient, start advancing diet, continue supportive care, decrease IV fluid rates continue IV antiemetics.  Will initiate oral analgesic regimen to assess tolerance with backup on IV pain medication if required.  Hopefully home in the next 24 to 48 hours.  Consultants:   None  Procedures:   See below for x-ray report.  Antimicrobials:  Anti-infectives (From admission, onward)   None      Subjective: Afebrile, no chest pain, no nausea, no vomiting.  Reports abdominal pain is better.  Would like to have diet advanced.  No signs of active withdrawal.  Objective: Vitals:   10/11/19 0604 10/11/19 0811 10/11/19 1320 10/11/19 1429  BP:   (!) 171/98 (!) 158/91  Pulse:   (!) 115 (!) 117  Resp:   (!) 21 20  Temp:    98.4 F (36.9 C)  TempSrc:      SpO2: (!) 83% 90% 93% Marland Kitchen)  89%  Weight:      Height:        Intake/Output Summary (Last 24 hours) at 10/11/2019 1537 Last data filed at  10/11/2019 0500 Gross per 24 hour  Intake 820 ml  Output --  Net 820 ml   Filed Weights   10/10/19 0528  Weight: 104.3 kg    Examination: General exam: Alert, awake, oriented x 3, reports pain to be better and denies any nausea vomiting.  Has tolerated oral medications and ginger ale.  Would like to have diet advanced.  No fever. Respiratory system: Clear to auscultation. Respiratory effort normal. Cardiovascular system:RRR. No murmurs, rubs, gallops. Gastrointestinal system: Abdomen is nondistended, soft and mildly tender in mid epigastric region on palpation. No organomegaly or masses felt. Normal bowel sounds heard. Central nervous system: Alert and oriented. No focal neurological deficits. Extremities: No C/C/E, +pedal pulses Skin: No rashes, lesions or ulcers Psychiatry: Judgement and insight appear normal. Mood & affect appropriate.     Data Reviewed: I have personally reviewed following labs and imaging studies  CBC: Recent Labs  Lab 10/10/19 0544  WBC 11.1*  NEUTROABS 7.9*  HGB 17.0  HCT 50.2  MCV 99.8  PLT 0000000   Basic Metabolic Panel: Recent Labs  Lab 10/10/19 0544 10/10/19 0801 10/11/19 0537  NA 139  --  135  K 4.1  --  3.8  CL 99  --  100  CO2 26  --  26  GLUCOSE 151*  --  137*  BUN 16  --  17  CREATININE 0.94  --  0.81  CALCIUM 9.3  --  8.5*  PHOS  --  2.2*  --    GFR: Estimated Creatinine Clearance: 121.7 mL/min (by C-G formula based on SCr of 0.81 mg/dL).   Liver Function Tests: Recent Labs  Lab 10/10/19 0544 10/11/19 0537  AST 28 30  ALT 26 19  ALKPHOS 79 56  BILITOT 0.8 1.2  PROT 7.8 6.4*  ALBUMIN 4.3 3.4*   Recent Labs  Lab 10/10/19 0544 10/11/19 0537  LIPASE 171* 70*   HbA1C: Recent Labs    10/10/19 0544  HGBA1C 5.3   Thyroid Function Tests: Recent Labs    10/10/19 0544  TSH 1.947   Anemia Panel: Recent Labs    10/10/19 0544  VITAMINB12 226   Urine analysis:    Component Value Date/Time   COLORURINE YELLOW  01/30/2012 0120   APPEARANCEUR CLEAR 01/30/2012 0120   LABSPEC 1.020 01/30/2012 0120   PHURINE 6.0 01/30/2012 0120   GLUCOSEU NEGATIVE 01/30/2012 0120   HGBUR NEGATIVE 01/30/2012 0120   BILIRUBINUR NEGATIVE 01/30/2012 0120   KETONESUR 15 (A) 01/30/2012 0120   PROTEINUR NEGATIVE 01/30/2012 0120   UROBILINOGEN 0.2 01/30/2012 0120   NITRITE NEGATIVE 01/30/2012 0120   LEUKOCYTESUR NEGATIVE 01/30/2012 0120    Recent Results (from the past 240 hour(s))  SARS CORONAVIRUS 2 (TAT 6-24 HRS) Nasopharyngeal Nasopharyngeal Swab     Status: None   Collection Time: 10/10/19  7:34 AM   Specimen: Nasopharyngeal Swab  Result Value Ref Range Status   SARS Coronavirus 2 NEGATIVE NEGATIVE Final    Comment: (NOTE) SARS-CoV-2 target nucleic acids are NOT DETECTED. The SARS-CoV-2 RNA is generally detectable in upper and lower respiratory specimens during the acute phase of infection. Negative results do not preclude SARS-CoV-2 infection, do not rule out co-infections with other pathogens, and should not be used as the sole basis for treatment or other patient management decisions. Negative results must be combined  with clinical observations, patient history, and epidemiological information. The expected result is Negative. Fact Sheet for Patients: SugarRoll.be Fact Sheet for Healthcare Providers: https://www.woods-mathews.com/ This test is not yet approved or cleared by the [place] FDA and  has been authorized for detection and/or diagnosis of SARS-CoV-2 by FDA under an Emergency Use Authorization (EUA). This EUA will remain  in effect (meaning this test can be used) for the duration of the COVID-19 declaration under Section 56 4(b)(1) of the Act, 21 U.S.C. section 360bbb-3(b)(1), unless the authorization is terminated or revoked sooner. Performed at [facility], [place] [address], [place], [place] [address]      Radiology Studies: CT Abdomen  Pelvis W Contrast  Result Date: 10/10/2019 CLINICAL DATA:  Nausea vomiting since 2 a.m. Epigastric abdominal pain. EXAM: CT ABDOMEN AND PELVIS WITH CONTRAST TECHNIQUE: Multidetector CT imaging of the abdomen and pelvis was performed using the standard protocol following bolus administration of intravenous contrast. CONTRAST:  1107mL OMNIPAQUE IOHEXOL 300 MG/ML  SOLN COMPARISON:  Chest CT, 08/03/2019.  Abdomen ultrasound, 03/18/2019. FINDINGS: Lower chest: No acute abnormality. Hepatobiliary: Diffuse decreased attenuation consistent with fatty infiltration. Normal in size. Small left lobe. No mass or focal lesion. Normal gallbladder. No bile duct dilation. Pancreas: Hazy inflammatory change surrounds pancreas. No pancreatic mass or peripancreatic fluid collection. There is homogeneous pancreatic enhancement. No duct dilation. Spleen: Normal in size without focal abnormality. Adrenals/Urinary Tract: No adrenal masses. Kidneys normal in size, orientation and position with symmetric enhancement and excretion. 2.5 cm cyst, lower pole the right kidney, with a 5 mm adjacent hypoattenuating lesion, too small to characterize but also likely a cyst. 1.6 cm midpole cyst on the left. No other renal masses, no stones and no hydronephrosis. Ureters normal in course and in caliber. Bladder is unremarkable. Stomach/Bowel: Small hiatal hernia. Stomach otherwise unremarkable. Small bowel is normal in caliber. No wall thickening or inflammation. Colon is normal in caliber. There are numerous diverticula mostly along the sigmoid, without evidence of diverticulitis. No colonic wall thickening or other inflammatory process. Normal appendix visualized. Vascular/Lymphatic: Portal vein, superior mesenteric vein and splenic vein are widely patent. Aortic atherosclerosis. No aneurysm. No enlarged lymph nodes. Reproductive: Radiation therapy seeds project in the prostate. No prosthetic enlargement. Other: Small umbilical hernia. A knuckle of  small bowel slightly enters the hernia. Musculoskeletal: Bilateral total hip arthroplasties appear well seated and well aligned. No fracture or acute finding. No osteoblastic or osteolytic lesions. IMPRESSION: 1. Acute uncomplicated pancreatitis. No evidence of pancreatic necrosis or of an abscess or pseudocyst. No venous thrombosis. 2. Hepatic steatosis. 3. Aortic atherosclerosis. 4. Colonic diverticulosis without evidence of diverticulitis. Electronically Signed   By: Lajean Manes M.D.   On: 10/10/2019 07:14    Scheduled Meds: . enoxaparin (LOVENOX) injection  40 mg Subcutaneous Q24H  . fluticasone furoate-vilanterol  1 puff Inhalation Daily   And  . umeclidinium bromide  1 puff Inhalation Daily  . folic acid  1 mg Oral Daily  . multivitamin with minerals  1 tablet Oral Daily  . pantoprazole  40 mg Oral Daily  . thiamine  100 mg Oral Daily   Or  . thiamine  100 mg Intravenous Daily  . vitamin B-12  1,000 mcg Oral Daily   Continuous Infusions: . sodium chloride 100 mL/hr at 10/11/19 1454     LOS: 1 day    Time spent: 30 minutes.   Barton Dubois, MD [facility] Pager [phone]   10/11/2019, 3:37 PM

## 2019-10-11 NOTE — Progress Notes (Signed)
Pt continues to give a MEWS score of 2. This remains from the previous shift due to increased HR and BP. This is not a new or acute change. Pt resting comfortably at this time. MD aware of increased HR and BP. No new orders at this time. Will continue to monitor patient.

## 2019-10-11 NOTE — Progress Notes (Signed)
Pt resting quietly, sleeping off and on, easily awakened when name called. Denies c/o. Tolerating po fluid (gingerale) without c/o n/v or abd pain. HR unchanged from earlier. MD aware and no new orders.

## 2019-10-11 NOTE — Progress Notes (Signed)
Per pt request, pt assisted to stand and ambulated 6 steps to recliner (with assist x2). Pt unsteady on feet, sways forwards and back and is impulsive with his movements. Pt states his hips are sore from laying in bed all day. Pt's wife at bedside. Pt taking clear liquids (gingerale and jello) without difficulty. Pt denies abd pain or n/v.

## 2019-10-11 NOTE — Progress Notes (Signed)
Dr. Dyann Kief notified of MEWS score. Called back and stated no new acute orders, only will advance his diet to clear liquids. Advised MD that pt was able to stand with assist at bedside to void. Denies c/o.

## 2019-10-11 NOTE — Progress Notes (Signed)
Pt continues to sleep, easily awakened to name called. Oriented to person, place and situation. Denies c/o. Pt's SaO2 noted to be 89%, pt has pulled O2 off. O2 at 1lpm [place] reapplied and SaO2 up to 93%. Pt has pulled condom cath off, states, "I don't want it right now." Bag emptied of 500 ml amber colored clear urine. Pt continues to tolerate ice chips with no c/o pain or n/v.IVF continue.

## 2019-10-11 NOTE — Progress Notes (Signed)
Pt remains up in chair, doing his leg exercises for his s/p hip replacement in Jan 2021. Denies c/o. Condom cath replaced per pt's request, states, "I ain't gonna be able to get up and down quick enough not to wet myself, so I might as well have that catheter tonight." Pt tolerated 50% of his full liquid diet without difficulty.

## 2019-10-11 NOTE — Progress Notes (Signed)
Pt has been sleeping all of the am with snoring respirations. Awakens easily to name called, oriented to person, place and situation. Took am meds without difficulty. Condom cath intact with amber colored urine draining. IVF continue without s/s infiltration.

## 2019-10-12 DIAGNOSIS — F1010 Alcohol abuse, uncomplicated: Secondary | ICD-10-CM

## 2019-10-12 LAB — BASIC METABOLIC PANEL
Anion gap: 8 (ref 5–15)
BUN: 13 mg/dL (ref 8–23)
CO2: 26 mmol/L (ref 22–32)
Calcium: 8.5 mg/dL — ABNORMAL LOW (ref 8.9–10.3)
Chloride: 101 mmol/L (ref 98–111)
Creatinine, Ser: 0.78 mg/dL (ref 0.61–1.24)
GFR calc Af Amer: >60 mL/min (ref >60)
GFR calc non Af Amer: >60 mL/min (ref >60)
Glucose, Bld: 130 mg/dL — ABNORMAL HIGH (ref 70–99)
Potassium: 3.5 mmol/L (ref 3.5–5.1)
Sodium: 135 mmol/L (ref 135–145)

## 2019-10-12 LAB — MAGNESIUM: Magnesium: 1.7 mg/dL (ref 1.7–2.4)

## 2019-10-12 MED ORDER — MAGNESIUM SULFATE 2 GM/50ML IV SOLN
2.0000 g | Freq: Once | INTRAVENOUS | Status: AC
  Administered 2019-10-12: 2 g via INTRAVENOUS
  Filled 2019-10-12: qty 50

## 2019-10-12 MED ORDER — POTASSIUM CHLORIDE CRYS ER 20 MEQ PO TBCR
20.0000 meq | EXTENDED_RELEASE_TABLET | Freq: Once | ORAL | Status: AC
  Administered 2019-10-12: 20 meq via ORAL
  Filled 2019-10-12: qty 1

## 2019-10-12 NOTE — Progress Notes (Signed)
Pt has been sleeping the majority of the day. Awakens to name called, oriented to person and place and situation. Restless at times, has set bed alarm off three times today attempting to get OOB to urinate. When reminded that he had a condom cath on, pt states, "Oh, I forgot. Sorry." Obeys commands, able to feed self soup and liquids for breakfast and lunch. Denies any complaint of pain in abd or hip, denies any c/o n/v.   Pt's wife in this am for short visit, asked why pt seemed so, "Out of it." Advised wife that pt is experiencing ETOH withdrawals and she stated, "Yea, that's what I thought." Pt then stated, "I've done this two or three times before, gone through DT's." Then pt immediately back to sleep.  Restless at times, but cooperative and pleasant. Tolerated po meds without difficulty. IVF continue without s/s infiltration. Condom cath draining clear amber urine.

## 2019-10-12 NOTE — Progress Notes (Signed)
Pt has been anxious and hard to redirect this shift. Condom catheter removed by pt several times. CPAP continuously removed by pt. Attempted to redirect and use mittens. Pt has slept for very short amounts of time. Pt asked "for something to calm me down" this morning. Morphine given as pt also complained of pain. Pt now resting.

## 2019-10-12 NOTE — Progress Notes (Signed)
Pt's wife at bedside, requests to talk with MD. MD Tat notified of wife's request. MD Tat in to pt's room to talk with pt and wife.

## 2019-10-12 NOTE — Progress Notes (Signed)
PROGRESS NOTE  [name] [id number] DOB: 1956/06/13 DOA: 10/10/2019 PCP: [name], MD  Brief History:  64 y.o.malewith past medical history significant for COPD/asthma, gastroesophageal reflux disease, alcohol abuse and prior history of alcoholic pancreatitis; who presented to the hospital secondary to mid epigastric and lower central chest pain for approximately6-8hours prior to visiting emergency department. Patient reports pain started suddenly, located in the mid epigastric area lower central chest, not radiated, 8 out of 10 in intensity and associated with nausea and vomiting (2 episodes of vomiting so far, no blood in his vomit content). Patient reports never experiencing discussion of pain before and expressed no aggravating or alleviating factors. As per his story no changes in his diet lately and have beencompliant with his medications. No productive cough, no fever, no headaches, no blurred vision, no dysuria, no hematuria, no focal weakness, no melena, no hematochezia or any other complaints.  Of note, patient reports drinking approximately 30 to 50 glasses of wine throughout the week.  In the ED work-up demonstrated elevated lipase and CT abdomen and pelvis with findings consistently for acute uncomplicated pancreatitis. IV fluids, pain medications and antiemetics has been provided TRH consulted to admit patient for further evaluation and management. The rest of his blood work was essentially unremarkable, EKG without acute on normalities and negative troponin.  Assessment/Plan: acute alcoholic pancreatitis -After bowel rest, IV fluids and supportive care patient expressed pain is better and is no longer experiencing nausea or vomiting. -advance to cardiac diet -Decrease IV fluid rate, continue supportive care  -check lipid panel  alcohol abuse -Cessation counseling was again provided -Continue folic acid and thiamine -Continue CIWA protocol  monitoring -B12--226l; start supplementation.  gastroesophageal reflux disease -Continue PPI.  COPD/asthma/tobacco abuse -Stable and well-controlled -No wheezing appreciated on exam -Continue the use of Trelegy Ellipta and as needed bronchodilators -check amb pulse ox on RA  OSA -resume home CPAP nightly   elevated blood pressure -No prior history of hypertension -Continue monitoring vital signs stable -continue as needed hydralazine.       Disposition Plan: Patient From: Home D/C Place: Home - 10/13/19 if tolerates diet  Barriers: PT eval  Family Communication:   Spouse updated at bedside 3/10  Consultants:  none  Code Status:  FULL  DVT Prophylaxis:  [place] Lovenox   Procedures: As Listed in Progress Note Above  Antibiotics: None          Subjective: Patient denies fevers, chills, headache, chest pain, dyspnea, nausea, vomiting, diarrhea, abdominal pain, dysuria, hematuria, hematochezia, and melena.   Objective: Vitals:   10/11/19 2146 10/12/19 0433 10/12/19 0816 10/12/19 1420  BP: (!) 164/101 (!) 162/96  (!) 147/79  Pulse: (!) 124 (!) 111  (!) 103  Resp: 20 17  18   Temp: 99.1 F (37.3 C) 99 F (37.2 C)  98.3 F (36.8 C)  TempSrc: Oral Oral  Oral  SpO2: (!) 89% 93% 94% 90%  Weight:      Height:        Intake/Output Summary (Last 24 hours) at 10/12/2019 1829 Last data filed at 10/12/2019 0816 Gross per 24 hour  Intake --  Output 800 ml  Net -800 ml   Weight change:  Exam:   General:  Pt is alert, follows commands appropriately, not in acute distress  HEENT: No icterus, No thrush, No neck mass, [place]/AT  Cardiovascular: RRR, S1/S2, no rubs, no gallops  Respiratory: bibasilar rales. No wheeze  Abdomen:  Soft/+BS, non tender, non distended, no guarding  Extremities: trace LE edema, No lymphangitis, No petechiae, No rashes, no synovitis   Data Reviewed: I have personally reviewed following labs and imaging studies Basic  Metabolic Panel: Recent Labs  Lab 10/10/19 0544 10/10/19 0801 10/11/19 0537 10/12/19 0434  NA 139  --  135 135  K 4.1  --  3.8 3.5  CL 99  --  100 101  CO2 26  --  26 26  GLUCOSE 151*  --  137* 130*  BUN 16  --  17 13  CREATININE 0.94  --  0.81 0.78  CALCIUM 9.3  --  8.5* 8.5*  MG  --   --   --  1.7  PHOS  --  2.2*  --   --    Liver Function Tests: Recent Labs  Lab 10/10/19 0544 10/11/19 0537  AST 28 30  ALT 26 19  ALKPHOS 79 56  BILITOT 0.8 1.2  PROT 7.8 6.4*  ALBUMIN 4.3 3.4*   Recent Labs  Lab 10/10/19 0544 10/11/19 0537  LIPASE 171* 70*   No results for input(s): AMMONIA in the last 168 hours. Coagulation Profile: No results for input(s): INR, PROTIME in the last 168 hours. CBC: Recent Labs  Lab 10/10/19 0544  WBC 11.1*  NEUTROABS 7.9*  HGB 17.0  HCT 50.2  MCV 99.8  PLT 175   Cardiac Enzymes: No results for input(s): CKTOTAL, CKMB, CKMBINDEX, TROPONINI in the last 168 hours. BNP: Invalid input(s): POCBNP CBG: No results for input(s): GLUCAP in the last 168 hours. HbA1C: Recent Labs    10/10/19 0544  HGBA1C 5.3   Urine analysis:    Component Value Date/Time   COLORURINE YELLOW 01/30/2012 0120   APPEARANCEUR CLEAR 01/30/2012 0120   LABSPEC 1.020 01/30/2012 0120   PHURINE 6.0 01/30/2012 0120   GLUCOSEU NEGATIVE 01/30/2012 0120   HGBUR NEGATIVE 01/30/2012 0120   BILIRUBINUR NEGATIVE 01/30/2012 0120   KETONESUR 15 (A) 01/30/2012 0120   PROTEINUR NEGATIVE 01/30/2012 0120   UROBILINOGEN 0.2 01/30/2012 0120   NITRITE NEGATIVE 01/30/2012 0120   LEUKOCYTESUR NEGATIVE 01/30/2012 0120   Sepsis Labs: @LABRCNTIP (procalcitonin:4,lacticidven:4) ) Recent Results (from the past 240 hour(s))  SARS CORONAVIRUS 2 (Lunette Tapp 6-24 HRS) Nasopharyngeal Nasopharyngeal Swab     Status: None   Collection Time: 10/10/19  7:34 AM   Specimen: Nasopharyngeal Swab  Result Value Ref Range Status   SARS Coronavirus 2 NEGATIVE NEGATIVE Final    Comment:  (NOTE) SARS-CoV-2 target nucleic acids are NOT DETECTED. The SARS-CoV-2 RNA is generally detectable in upper and lower respiratory specimens during the acute phase of infection. Negative results do not preclude SARS-CoV-2 infection, do not rule out co-infections with other pathogens, and should not be used as the sole basis for treatment or other patient management decisions. Negative results must be combined with clinical observations, patient history, and epidemiological information. The expected result is Negative. Fact Sheet for Patients: SugarRoll.be Fact Sheet for Healthcare Providers: https://www.woods-mathews.com/ This test is not yet approved or cleared by the [place] FDA and  has been authorized for detection and/or diagnosis of SARS-CoV-2 by FDA under an Emergency Use Authorization (EUA). This EUA will remain  in effect (meaning this test can be used) for the duration of the COVID-19 declaration under Section 56 4(b)(1) of the Act, 21 U.S.C. section 360bbb-3(b)(1), unless the authorization is terminated or revoked sooner. Performed at [facility], [place] [address] [place], [place] [address]  Scheduled Meds: . enoxaparin (LOVENOX) injection  40 mg Subcutaneous Q24H  . fluticasone furoate-vilanterol  1 puff Inhalation Daily   And  . umeclidinium bromide  1 puff Inhalation Daily  . folic acid  1 mg Oral Daily  . multivitamin with minerals  1 tablet Oral Daily  . pantoprazole  40 mg Oral Daily  . thiamine  100 mg Oral Daily   Or  . thiamine  100 mg Intravenous Daily  . vitamin B-12  1,000 mcg Oral Daily   Continuous Infusions: . sodium chloride 75 mL/hr at 10/12/19 1532    Procedures/Studies: CT Abdomen Pelvis W Contrast  Result Date: 10/10/2019 CLINICAL DATA:  Nausea vomiting since 2 a.m. Epigastric abdominal pain. EXAM: CT ABDOMEN AND PELVIS WITH CONTRAST TECHNIQUE: Multidetector CT imaging of the abdomen  and pelvis was performed using the standard protocol following bolus administration of intravenous contrast. CONTRAST:  157mL OMNIPAQUE IOHEXOL 300 MG/ML  SOLN COMPARISON:  Chest CT, 08/03/2019.  Abdomen ultrasound, 03/18/2019. FINDINGS: Lower chest: No acute abnormality. Hepatobiliary: Diffuse decreased attenuation consistent with fatty infiltration. Normal in size. Small left lobe. No mass or focal lesion. Normal gallbladder. No bile duct dilation. Pancreas: Hazy inflammatory change surrounds pancreas. No pancreatic mass or peripancreatic fluid collection. There is homogeneous pancreatic enhancement. No duct dilation. Spleen: Normal in size without focal abnormality. Adrenals/Urinary Tract: No adrenal masses. Kidneys normal in size, orientation and position with symmetric enhancement and excretion. 2.5 cm cyst, lower pole the right kidney, with a 5 mm adjacent hypoattenuating lesion, too small to characterize but also likely a cyst. 1.6 cm midpole cyst on the left. No other renal masses, no stones and no hydronephrosis. Ureters normal in course and in caliber. Bladder is unremarkable. Stomach/Bowel: Small hiatal hernia. Stomach otherwise unremarkable. Small bowel is normal in caliber. No wall thickening or inflammation. Colon is normal in caliber. There are numerous diverticula mostly along the sigmoid, without evidence of diverticulitis. No colonic wall thickening or other inflammatory process. Normal appendix visualized. Vascular/Lymphatic: Portal vein, superior mesenteric vein and splenic vein are widely patent. Aortic atherosclerosis. No aneurysm. No enlarged lymph nodes. Reproductive: Radiation therapy seeds project in the prostate. No prosthetic enlargement. Other: Small umbilical hernia. A knuckle of small bowel slightly enters the hernia. Musculoskeletal: Bilateral total hip arthroplasties appear well seated and well aligned. No fracture or acute finding. No osteoblastic or osteolytic lesions. IMPRESSION:  1. Acute uncomplicated pancreatitis. No evidence of pancreatic necrosis or of an abscess or pseudocyst. No venous thrombosis. 2. Hepatic steatosis. 3. Aortic atherosclerosis. 4. Colonic diverticulosis without evidence of diverticulitis. Electronically Signed   By: Lajean Manes M.D.   On: 10/10/2019 07:14    Orson Eva, DO  [facility]  If 7PM-7AM, please contact night-coverage [email or web address] Password [facility] 10/12/2019, 6:29 PM   LOS: 2 days

## 2019-10-12 NOTE — Progress Notes (Signed)
Pt was up in chair for 2.5 hours this evening. Ambulated with one standby assist, still a little wobbly but did well. Tolerated full liquid supper tray well. Denies n/v or abd pain. Pt back to bed per his request. Condom replaced per pt request. Using callbell appropriately. No tremors noted. Oriented x 3. C/o headache rated 6, requested and given Tylenol for relief. IVF infusing without diff or s/s infiltration.

## 2019-10-13 DIAGNOSIS — Z720 Tobacco use: Secondary | ICD-10-CM

## 2019-10-13 DIAGNOSIS — F1020 Alcohol dependence, uncomplicated: Secondary | ICD-10-CM

## 2019-10-13 LAB — COMPREHENSIVE METABOLIC PANEL
ALT: 17 U/L (ref 0–44)
AST: 22 U/L (ref 15–41)
Albumin: 2.8 g/dL — ABNORMAL LOW (ref 3.5–5.0)
Alkaline Phosphatase: 49 U/L (ref 38–126)
Anion gap: 6 (ref 5–15)
BUN: 13 mg/dL (ref 8–23)
CO2: 27 mmol/L (ref 22–32)
Calcium: 8.1 mg/dL — ABNORMAL LOW (ref 8.9–10.3)
Chloride: 103 mmol/L (ref 98–111)
Creatinine, Ser: 0.74 mg/dL (ref 0.61–1.24)
GFR calc Af Amer: >60 mL/min (ref >60)
GFR calc non Af Amer: >60 mL/min (ref >60)
Glucose, Bld: 112 mg/dL — ABNORMAL HIGH (ref 70–99)
Potassium: 3.5 mmol/L (ref 3.5–5.1)
Sodium: 136 mmol/L (ref 135–145)
Total Bilirubin: 1 mg/dL (ref 0.3–1.2)
Total Protein: 6.1 g/dL — ABNORMAL LOW (ref 6.5–8.1)

## 2019-10-13 LAB — MAGNESIUM: Magnesium: 2.2 mg/dL (ref 1.7–2.4)

## 2019-10-13 MED ORDER — CYANOCOBALAMIN 1000 MCG PO TABS: 1000.0000 ug | ORAL_TABLET | Freq: Every day | ORAL | Status: DC

## 2019-10-13 MED ORDER — AMLODIPINE BESYLATE 5 MG PO TABS: 5.0000 mg | ORAL_TABLET | Freq: Every day | ORAL | Status: DC

## 2019-10-13 MED ORDER — AMLODIPINE BESYLATE 5 MG PO TABS: 5.0000 mg | ORAL_TABLET | Freq: Every day | ORAL | 1 refills | Status: DC

## 2019-10-13 NOTE — Progress Notes (Signed)
Discharge instructions given to patient. Verbalized understanding of follow up appointments and medications. Verbalized understanding of presciption at [facility].  Left unit via w/c. No acute distress noted.

## 2019-10-13 NOTE — TOC Transition Note (Signed)
Transition of Care [facility]) - CM/SW Discharge Note   Patient Details  Name: [name] MRN: [id number] Date of Birth: September 01, 1955  Transition of Care [facility]) CM/SW Contact:  Boneta Lucks, RN Phone Number: 10/13/2019, 3:09 PM   Clinical Narrative:   TOC unable to find Home Health to accept [facility] insurance for [place].  Patient notified, management notified.  Patient is seeing his PCP tomorrow and will discuss out patient PT with his wife and doctor.     Final next level of care: [place] Barriers to Discharge: Barriers Resolved   Patient Goals and CMS Choice Patient states their goals for this hospitalization and ongoing recovery are:: to go home. CMS Medicare.gov Compare Post Acute Care list provided to:: Patient Choice offered to / list presented to : Patient  Discharge Placement                Patient to be transferred to facility by: [facility]   Patient and family notified of of transfer: 10/13/19  Discharge Plan and Services                          HH Arranged: PT [facility] Agency: [place] ([place]) Date HH Agency Contacted: 10/13/19 Time [place]: [phone] Representative spoke with at [place]

## 2019-10-13 NOTE — Plan of Care (Signed)
  Problem: Acute Rehab PT Goals(only PT should resolve) Goal: Pt Will Go Supine/Side To Sit Outcome: Progressing Flowsheets (Taken 10/13/2019 0907) Pt will go Supine/Side to Sit: Independently Goal: Patient Will Transfer Sit To/From Stand Outcome: Progressing Flowsheets (Taken 10/13/2019 0907) Patient will transfer sit to/from stand: with modified independence Goal: Pt Will Transfer Bed To Chair/Chair To Bed Outcome: Progressing Flowsheets (Taken 10/13/2019 0907) Pt will Transfer Bed to Chair/Chair to Bed: with modified independence Goal: Pt Will Ambulate Outcome: Progressing Flowsheets (Taken 10/13/2019 0907) Pt will Ambulate:  with modified independence  with supervision  > 125 feet  with rolling walker   9:07 AM, 10/13/19 Lonell Grandchild, MPT Physical Therapist with [facility] [phone] office [phone] mobile phone

## 2019-10-13 NOTE — Evaluation (Signed)
Physical Therapy Evaluation Patient Details Name: [name] MRN: [id number] DOB: April 29, 1956 Today's Date: 10/13/2019   History of Present Illness  [name] is a 64 y.o. male with past medical history significant for COPD/asthma, gastroesophageal reflux disease, alcohol abuse and prior history of alcoholic pancreatitis; who presented to the hospital secondary to mid epigastric and lower central chest pain for approximately 6-8 hours prior to visiting emergency department.  Patient reports pain started suddenly, located in the mid epigastric area lower central chest, not radiated, 8 out of 10 in intensity and associated with nausea and vomiting (2 episodes of vomiting so far, no blood in his vomit content).  Patient reports never experiencing discussion of pain before and expressed no aggravating or alleviating factors.  As per his story no changes in his diet lately and have been compliant with his medications.  No productive cough, no fever, no headaches, no blurred vision, no dysuria, no hematuria, no focal weakness, no melena, no hematochezia or any other complaints.    Clinical Impression  Patient functioning near baseline for functional mobility and gait, incontinent of urine upon standing and had to walk to bathroom to finish, slightly unsteady on feet having to lean on nearby objects for support without AD, improvement balance using RW, occasional bumping into objects on left side ambulating in hallway, no loss of balance and tolerated sitting up in chair after therapy - nursing staff aware.  Patient will benefit from continued physical therapy in hospital and recommended venue below to increase strength, balance, endurance for safe ADLs and gait.     Follow Up Recommendations Home health PT;Supervision for mobility/OOB;Supervision - Intermittent    Equipment Recommendations  None recommended by PT    Recommendations for Other Services       Precautions / Restrictions  Precautions Precautions: Fall Restrictions Weight Bearing Restrictions: No      Mobility  Bed Mobility Overal bed mobility: Modified Independent             General bed mobility comments: slightly increased time  Transfers Overall transfer level: Needs assistance Equipment used: Rolling walker (2 wheeled) Transfers: Sit to/from [facility] Sit to Stand: Supervision Stand pivot transfers: Min guard;Supervision       General transfer comment: slightly labored movement, tends to lean on nearby objects for support when not using an AD  Ambulation/Gait Ambulation/Gait assistance: Min guard Gait Distance (Feet): 100 Feet Assistive device: Rolling walker (2 wheeled) Gait Pattern/deviations: Decreased step length - left;Decreased step length - right;Decreased stride length Gait velocity: decreased   General Gait Details: slightly labored cadence with occasional bumping into objects on the left without loss of balance, on room air with SpO2 maintained at 92%  Stairs            Wheelchair Mobility    Modified Rankin (Stroke Patients Only)       Balance Overall balance assessment: Needs assistance Sitting-balance support: Feet supported;No upper extremity supported Sitting balance-Leahy Scale: Good Sitting balance - Comments: seated at EOB   Standing balance support: During functional activity;No upper extremity supported Standing balance-Leahy Scale: Poor Standing balance comment: fair/poor without AD, fair/good using RW                             Pertinent Vitals/Pain Pain Assessment: No/denies pain    Home Living Family/patient expects to be discharged to:: Private residence Living Arrangements: Spouse/significant other Available Help at Discharge: Family;Available 24 hours/day  Type of Home: House Home Access: Stairs to enter Entrance Stairs-Rails: None Entrance Stairs-Number of Steps: 1 Home Layout: Two level;Able to live  on main level with bedroom/bathroom Home Equipment: Gilford Rile - 2 wheels;Shower seat;Bedside commode;Cane - single point      Prior Function Level of Independence: Independent with assistive device(s)         Comments: Hydrographic surveyor with RW, drives     Hand Dominance        Extremity/Trunk Assessment   Upper Extremity Assessment Upper Extremity Assessment: Overall WFL for tasks assessed    Lower Extremity Assessment Lower Extremity Assessment: Generalized weakness    Cervical / Trunk Assessment Cervical / Trunk Assessment: Normal  Communication   Communication: No difficulties  Cognition Arousal/Alertness: Awake/alert Behavior During Therapy: WFL for tasks assessed/performed Overall Cognitive Status: Within Functional Limits for tasks assessed                                        General Comments      Exercises     Assessment/Plan    PT Assessment Patient needs continued PT services  PT Problem List Decreased strength;Decreased activity tolerance;Decreased balance;Decreased mobility       PT Treatment Interventions Balance training;Gait training;Stair training;Functional mobility training;Therapeutic exercise;Therapeutic activities;Patient/family education    PT Goals (Current goals can be found in the Care Plan section)  Acute Rehab PT Goals Patient Stated Goal: return home with family to assist PT Goal Formulation: With patient Time For Goal Achievement: 10/15/19 Potential to Achieve Goals: Good    Frequency Min 3X/week   Barriers to discharge        Co-evaluation               AM-PAC PT "6 Clicks" Mobility  Outcome Measure Help needed turning from your back to your side while in a flat bed without using bedrails?: None Help needed moving from lying on your back to sitting on the side of a flat bed without using bedrails?: None Help needed moving to and from a bed to a chair (including a wheelchair)?: A Lot Help  needed standing up from a chair using your arms (e.g., wheelchair or bedside chair)?: None Help needed to walk in hospital room?: A Little Help needed climbing 3-5 steps with a railing? : A Little 6 Click Score: 20    End of Session   Activity Tolerance: Patient tolerated treatment well;Patient limited by fatigue Patient left: in chair;with call bell/phone within reach;with chair alarm set Nurse Communication: Mobility status PT Visit Diagnosis: Unsteadiness on feet (R26.81);Other abnormalities of gait and mobility (R26.89);Muscle weakness (generalized) (M62.81)    Time: [id number] PT Time Calculation (min) (ACUTE ONLY): 36 min   Charges:   PT Evaluation $PT Eval Moderate Complexity: 1 Mod PT Treatments $Therapeutic Activity: 23-37 mins        9:05 AM, 10/13/19 Lonell Grandchild, MPT Physical Therapist with [facility] [phone] office [phone] mobile phone

## 2019-10-13 NOTE — Discharge Summary (Signed)
Physician Discharge Summary  [name] [id number] DOB: 03-26-1956 DOA: 10/10/2019  PCP: [name], MD  Admit date: 10/10/2019 Discharge date: 10/13/2019  Admitted From: Home Disposition:  Home   Recommendations for Outpatient Follow-up:  1. Follow up with PCP in 1-2 weeks 2. Please obtain BMP/CBC in one week   Home Health: yes Equipment/Devices:HHPT  Discharge Condition: Stable CODE STATUS: FULL Diet recommendation: Heart Healthy    Brief/Interim Summary: 64 y.o.malewith past medical history significant for COPD/asthma, gastroesophageal reflux disease, alcohol abuse and prior history of alcoholic pancreatitis; who presented to the hospital secondary to mid epigastric and lower central chest pain for approximately6-8hours prior to visiting emergency department. Patient reports pain started suddenly, located in the mid epigastric area lower central chest, not radiated, 8 out of 10 in intensity and associated with nausea and vomiting (2 episodes of vomiting so far, no blood in his vomit content). Patient reports never experiencing discussion of pain before and expressed no aggravating or alleviating factors. As per his story no changes in his diet lately and have beencompliant with his medications. No productive cough, no fever, no headaches, no blurred vision, no dysuria, no hematuria, no focal weakness, no melena, no hematochezia or any other complaints.  Of note, patient reports drinking approximately 30 to 50 glasses of wine throughout the week.  In the ED work-up demonstrated elevated lipase and CT abdomen and pelvis with findings consistently for acute uncomplicated pancreatitis. IV fluids, pain medications and antiemetics has been provided TR[facility] consulted to admit patient for further evaluation and management. The rest of his blood work was essentially unremarkable, EKG without acute on normalities and negative troponin.   Discharge Diagnoses:  acute  alcoholic pancreatitis -After bowel rest, IV fluids and supportive care patient expressed pain is better and is no longer experiencing nausea or vomiting. -advance to cardiac diet which pt tolerated -Decreased IV fluid rate, continue supportive care   alcohol abuse -Cessation counseling was again provided -Continue folic acid and thiamine -Continue CIWA protocol monitoring -B12--226l; start supplementation. -no DTs during hospitalization  gastroesophageal reflux disease -Continue PPI.  COPD/asthma/tobacco abuse -Stable and well-controlled -No wheezing appreciated on exam -Continue the use of Trelegy Ellipta and as needed bronchodilators -check amb pulse ox on RA--no desaturation <88%  OSA -resume home CPAP nightly   HTN -No prior history of hypertension -Continue monitoring vital signs stable -preponderance of BPs with sbp 150s-160 and dbp >90 -start amlodipine   Discharge Instructions   Allergies as of 10/13/2019   No Known Allergies     Medication List    TAKE these medications   albuterol 108 (90 Base) MCG/ACT inhaler Commonly known as: VENTOLIN HFA Inhale 2 puffs into the lungs every 4 (four) hours as needed for wheezing or shortness of breath.   albuterol (2.5 MG/3ML) 0.083% nebulizer solution Commonly known as: PROVENTIL Take 3 mLs (2.5 mg total) by nebulization every 6 (six) hours as needed for wheezing or shortness of breath.   aspirin EC 81 MG tablet Take 81 mg by mouth daily.   cyanocobalamin 1000 MCG tablet Take 1 tablet (1,000 mcg total) by mouth daily. Start taking on: October 14, 2019   doxycycline 50 MG capsule Commonly known as: VIBRAMYCIN Take 50 mg by mouth daily.   pantoprazole 40 MG tablet Commonly known as: PROTONIX TAKE 1 TABLET(40 MG) BY MOUTH DAILY 30 MINUTES BEFORE BREAKFAST What changed: See the new instructions.   Trelegy Ellipta 100-62.5-25 MCG/INH Aepb Generic drug: Fluticasone-Umeclidin-Vilant Inhale 1 puff into the  lungs daily.   TYLENOL 500 MG tablet Generic drug: acetaminophen Take 500 mg by mouth every 6 (six) hours as needed for mild pain or headache.       No Known Allergies  Consultations:  none   Procedures/Studies: CT Abdomen Pelvis W Contrast  Result Date: 10/10/2019 CLINICAL DATA:  Nausea vomiting since 2 a.m. Epigastric abdominal pain. EXAM: CT ABDOMEN AND PELVIS WITH CONTRAST TECHNIQUE: Multidetector CT imaging of the abdomen and pelvis was performed using the standard protocol following bolus administration of intravenous contrast. CONTRAST:  160mL OMNIPAQUE IOHEXOL 300 MG/ML  SOLN COMPARISON:  Chest CT, 08/03/2019.  Abdomen ultrasound, 03/18/2019. FINDINGS: Lower chest: No acute abnormality. Hepatobiliary: Diffuse decreased attenuation consistent with fatty infiltration. Normal in size. Small left lobe. No mass or focal lesion. Normal gallbladder. No bile duct dilation. Pancreas: Hazy inflammatory change surrounds pancreas. No pancreatic mass or peripancreatic fluid collection. There is homogeneous pancreatic enhancement. No duct dilation. Spleen: Normal in size without focal abnormality. Adrenals/Urinary Tract: No adrenal masses. Kidneys normal in size, orientation and position with symmetric enhancement and excretion. 2.5 cm cyst, lower pole the right kidney, with a 5 mm adjacent hypoattenuating lesion, too small to characterize but also likely a cyst. 1.6 cm midpole cyst on the left. No other renal masses, no stones and no hydronephrosis. Ureters normal in course and in caliber. Bladder is unremarkable. Stomach/Bowel: Small hiatal hernia. Stomach otherwise unremarkable. Small bowel is normal in caliber. No wall thickening or inflammation. Colon is normal in caliber. There are numerous diverticula mostly along the sigmoid, without evidence of diverticulitis. No colonic wall thickening or other inflammatory process. Normal appendix visualized. Vascular/Lymphatic: Portal vein, superior  mesenteric vein and splenic vein are widely patent. Aortic atherosclerosis. No aneurysm. No enlarged lymph nodes. Reproductive: Radiation therapy seeds project in the prostate. No prosthetic enlargement. Other: Small umbilical hernia. A knuckle of small bowel slightly enters the hernia. Musculoskeletal: Bilateral total hip arthroplasties appear well seated and well aligned. No fracture or acute finding. No osteoblastic or osteolytic lesions. IMPRESSION: 1. Acute uncomplicated pancreatitis. No evidence of pancreatic necrosis or of an abscess or pseudocyst. No venous thrombosis. 2. Hepatic steatosis. 3. Aortic atherosclerosis. 4. Colonic diverticulosis without evidence of diverticulitis. Electronically Signed   By: Lajean Manes M.D.   On: 10/10/2019 07:14         Discharge Exam: Vitals:   10/13/19 0400 10/13/19 1127  BP: (!) 168/96   Pulse: 91   Resp: 20   Temp: 99 F (37.2 C)   SpO2:  96%   Vitals:   10/12/19 2151 10/12/19 2200 10/13/19 0400 10/13/19 1127  BP: (!) 149/82  (!) 168/96   Pulse: 94 91 91   Resp: (!) 24 (!) 22 20   Temp: 99.7 F (37.6 C)  99 F (37.2 C)   TempSrc: Oral  Oral   SpO2: 94% 95%  96%  Weight:      Height:        General: Pt is alert, awake, not in acute distress Cardiovascular: RRR, S1/S2 +, no rubs, no gallops Respiratory: bibasilar crackles. No wheeze Abdominal: Soft, NT, ND, bowel sounds + Extremities: no edema, no cyanosis   The results of significant diagnostics from this hospitalization (including imaging, microbiology, ancillary and laboratory) are listed below for reference.    Significant Diagnostic Studies: CT Abdomen Pelvis W Contrast  Result Date: 10/10/2019 CLINICAL DATA:  Nausea vomiting since 2 a.m. Epigastric abdominal pain. EXAM: CT ABDOMEN AND PELVIS WITH CONTRAST TECHNIQUE: Multidetector CT imaging  of the abdomen and pelvis was performed using the standard protocol following bolus administration of intravenous contrast. CONTRAST:   180mL OMNIPAQUE IOHEXOL 300 MG/ML  SOLN COMPARISON:  Chest CT, 08/03/2019.  Abdomen ultrasound, 03/18/2019. FINDINGS: Lower chest: No acute abnormality. Hepatobiliary: Diffuse decreased attenuation consistent with fatty infiltration. Normal in size. Small left lobe. No mass or focal lesion. Normal gallbladder. No bile duct dilation. Pancreas: Hazy inflammatory change surrounds pancreas. No pancreatic mass or peripancreatic fluid collection. There is homogeneous pancreatic enhancement. No duct dilation. Spleen: Normal in size without focal abnormality. Adrenals/Urinary Tract: No adrenal masses. Kidneys normal in size, orientation and position with symmetric enhancement and excretion. 2.5 cm cyst, lower pole the right kidney, with a 5 mm adjacent hypoattenuating lesion, too small to characterize but also likely a cyst. 1.6 cm midpole cyst on the left. No other renal masses, no stones and no hydronephrosis. Ureters normal in course and in caliber. Bladder is unremarkable. Stomach/Bowel: Small hiatal hernia. Stomach otherwise unremarkable. Small bowel is normal in caliber. No wall thickening or inflammation. Colon is normal in caliber. There are numerous diverticula mostly along the sigmoid, without evidence of diverticulitis. No colonic wall thickening or other inflammatory process. Normal appendix visualized. Vascular/Lymphatic: Portal vein, superior mesenteric vein and splenic vein are widely patent. Aortic atherosclerosis. No aneurysm. No enlarged lymph nodes. Reproductive: Radiation therapy seeds project in the prostate. No prosthetic enlargement. Other: Small umbilical hernia. A knuckle of small bowel slightly enters the hernia. Musculoskeletal: Bilateral total hip arthroplasties appear well seated and well aligned. No fracture or acute finding. No osteoblastic or osteolytic lesions. IMPRESSION: 1. Acute uncomplicated pancreatitis. No evidence of pancreatic necrosis or of an abscess or pseudocyst. No venous  thrombosis. 2. Hepatic steatosis. 3. Aortic atherosclerosis. 4. Colonic diverticulosis without evidence of diverticulitis. Electronically Signed   By: Lajean Manes M.D.   On: 10/10/2019 07:14     Microbiology: Recent Results (from the past 240 hour(s))  SARS CORONAVIRUS 2 ([place] 6-24 HRS) Nasopharyngeal Nasopharyngeal Swab     Status: None   Collection Time: 10/10/19  7:34 AM   Specimen: Nasopharyngeal Swab  Result Value Ref Range Status   SARS Coronavirus 2 NEGATIVE NEGATIVE Final    Comment: (NOTE) SARS-CoV-2 target nucleic acids are NOT DETECTED. The SARS-CoV-2 RNA is generally detectable in upper and lower respiratory specimens during the acute phase of infection. Negative results do not preclude SARS-CoV-2 infection, do not rule out co-infections with other pathogens, and should not be used as the sole basis for treatment or other patient management decisions. Negative results must be combined with clinical observations, patient history, and epidemiological information. The expected result is Negative. Fact Sheet for Patients: SugarRoll.be Fact Sheet for Healthcare Providers: https://www.woods-mathews.com/ This test is not yet approved or cleared by the [place] FDA and  has been authorized for detection and/or diagnosis of SARS-CoV-2 by FDA under an Emergency Use Authorization (EUA). This EUA will remain  in effect (meaning this test can be used) for the duration of the COVID-19 declaration under Section 56 4(b)(1) of the Act, 21 U.S.C. section 360bbb-3(b)(1), unless the authorization is terminated or revoked sooner. Performed at [facility], [place] [address] [place], [place] [address]      Labs: Basic Metabolic Panel: Recent Labs  Lab 10/10/19 0544 10/10/19 0544 10/10/19 0801 10/11/19 0537 10/11/19 0537 10/12/19 0434 10/13/19 0539  NA 139  --   --  135  --  135 136  K 4.1   < >  --  3.8   < > 3.5 3.5  CL 99  --    --  100  --  101 103  CO2 26  --   --  26  --  26 27  GLUCOSE 151*  --   --  137*  --  130* 112*  BUN 16  --   --  17  --  13 13  CREATININE 0.94  --   --  0.81  --  0.78 0.74  CALCIUM 9.3  --   --  8.5*  --  8.5* 8.1*  MG  --   --   --   --   --  1.7 2.2  PHOS  --   --  2.2*  --   --   --   --    < > = values in this interval not displayed.   Liver Function Tests: Recent Labs  Lab 10/10/19 0544 10/11/19 0537 10/13/19 0539  AST 28 30 22   ALT 26 19 17   ALKPHOS 79 56 49  BILITOT 0.8 1.2 1.0  PROT 7.8 6.4* 6.1*  ALBUMIN 4.3 3.4* 2.8*   Recent Labs  Lab 10/10/19 0544 10/11/19 0537  LIPASE 171* 70*   No results for input(s): AMMONIA in the last 168 hours. CBC: Recent Labs  Lab 10/10/19 0544  WBC 11.1*  NEUTROABS 7.9*  HGB 17.0  HCT 50.2  MCV 99.8  PLT 175   Cardiac Enzymes: No results for input(s): CKTOTAL, CKMB, CKMBINDEX, TROPONINI in the last 168 hours. BNP: Invalid input(s): POCBNP CBG: No results for input(s): GLUCAP in the last 168 hours.  Time coordinating discharge:  36 minutes  Signed:  Orson Eva, DO [facility] Pager: [phone] 10/13/2019, 11:58 AM

## 2020-01-10 ENCOUNTER — Other Ambulatory Visit: Payer: Self-pay | Admitting: Urology

## 2020-01-11 LAB — PSA: PSA: <0.1 ng/mL (ref <=4.0)

## 2020-01-17 ENCOUNTER — Encounter: Payer: Self-pay | Admitting: Urology

## 2020-01-17 ENCOUNTER — Ambulatory Visit (INDEPENDENT_AMBULATORY_CARE_PROVIDER_SITE_OTHER): Payer: 59 | Admitting: Urology

## 2020-01-17 ENCOUNTER — Ambulatory Visit: Payer: 59 | Admitting: Urology

## 2020-01-17 ENCOUNTER — Other Ambulatory Visit: Payer: Self-pay

## 2020-01-17 VITALS — BP 135/76 | HR 97 | Temp 98.1°F | Ht 72.0 in | Wt 230.0 lb

## 2020-01-17 DIAGNOSIS — C61 Malignant neoplasm of prostate: Secondary | ICD-10-CM

## 2020-01-17 DIAGNOSIS — N521 Erectile dysfunction due to diseases classified elsewhere: Secondary | ICD-10-CM | POA: Diagnosis not present

## 2020-01-17 DIAGNOSIS — N486 Induration penis plastica: Secondary | ICD-10-CM | POA: Diagnosis not present

## 2020-01-17 LAB — POCT URINALYSIS DIPSTICK
Bilirubin, UA: NEGATIVE
Blood, UA: NEGATIVE
Glucose, UA: NEGATIVE
Ketones, UA: NEGATIVE
Leukocytes, UA: NEGATIVE
Nitrite, UA: NEGATIVE
Protein, UA: NEGATIVE
Spec Grav, UA: 1.025 (ref 1.010–1.025)
Urobilinogen, UA: NEGATIVE E.U./dL — AB
pH, UA: 5 (ref 5.0–8.0)

## 2020-01-17 MED ORDER — SILDENAFIL CITRATE 100 MG PO TABS: 100.0000 mg | ORAL_TABLET | Freq: Every day | ORAL | 99 refills | Status: DC | PRN

## 2020-01-17 NOTE — Progress Notes (Signed)
See progress note.

## 2020-01-17 NOTE — Progress Notes (Signed)
H&P  Chief Complaint: Prostate cancer  History of Present Illness:   6.15.2021: Most recent PSA undetectable (6.8.2021). Here today for follow-up. He denies any specific urological complaints at present aside from some strong urinary urgency -- this is manageable so long as he is quick to get to the restroom. He denies any recent hematuria. Since treatment, he has noted some pretty significant diminishment in size of his penis. Additionally, he has noticed a new downward bend to his penis when erect -- this is non-painful. He is interested in treatment for his ED. He remains off tamsulosin.   (below copied from [facility] records):  Prostate Cancer:  He underwent TRUS/Bx on 5.1.2018. He had a 4 mm left apical nodule, a PSA of 5.99. Prostatic volume was 17 g. PSA density 0.35.  6/12 cores were positive for adenocarcinoma, all on the left side of the prostate.  3 cores revealed GS 3+3  3 cores revealed GS3+4  IPSS pre-procedure: 2/0   He underwent I-125 brachytherapy on 9.20.2018.      02/21/19 08/17/18 02/19/18 10/28/17 10/24/16 07/22/16 03/02/13  Total PSA 0.2 ng/dl 0.4 ng/dl 0.4 ng/dl 0.7 ng/dl 5.99 ng/dl 6.9 ng/dl 3.0 ng/dl    Past Medical History:  Diagnosis Date  . Bilateral shoulder bursitis   . Chronic back pain   . COPD (chronic obstructive pulmonary disease) with emphysema ([place])   . DDD (degenerative disc disease), lumbar    hx epideral injection  L5--S1   . DVT (deep venous thrombosis) ([place]) 11/19/2018  . Dysphasia    intermittant w/ food   . Dyspnea   . Dyspnea on exertion   . GERD (gastroesophageal reflux disease)   . Gout    L great toe  . Hiatal hernia   . Hip problem 2020   left, seeing orthopedics  . History of acute pancreatitis    alcoholic pancreatitis 27-01-2375 and 01-30-2012  . History of colon polyps    10-17-2005  hyperplastic polyp's  . History of esophageal stricture    s/p  dilation 02-02-2017  . History of peptic ulcer 1990s  . History of  pneumococcal pneumonia 2006   bilateral pneumonia w/ left lower lobe abscess treated w/ chest tube with suction for drainage  . History of pneumothorax    1984--  left spontaneous pneumothorax ,  treated w/ chest tube  . Hypertension   . Malignant neoplasm of prostate [facility] urologist-  dr Kiona Blume/  oncologist -- dr Tammi Klippel   dx 12-02-2016-- Stage T2b, Gleason 3+4,  PSA 5.99,  vol 25cc  . Numbness of left foot    outer left ankle numb-- per pt has appt. w/ neurologist  . OSA (obstructive sleep apnea)    per pt has not used cpap over year ago from 04-16-2017--- per study 01-17-2010  severe osa  . Solid nodule of lung greater than 8 mm in diameter 05/11/2018   03/2018: RUL Nodule, 69mm  . Wears glasses     Past Surgical History:  Procedure Laterality Date  . BIOPSY  02/02/2017   Procedure: BIOPSY;  Surgeon: Daneil Dolin, MD;  [facility];  Service: Endoscopy;;  duodenal bx's, esophgeal bx's  . BIOPSY  07/30/2017   Procedure: BIOPSY;  Surgeon: Daneil Dolin, MD;  [facility];  Service: Endoscopy;;  esophagus  . CARDIOVASCULAR STRESS TEST  09/12/2009   normal nuclear perfusion study w/ no ischemia /  normal LV function and wall motion , ef 57%  . COLONOSCOPY  2007  Dr. Gala Romney: hyperplastic polyps, internal hemorrhoids   . COLONOSCOPY WITH PROPOFOL N/A 05/03/2018   Dr. Gala Romney: diverticulosis in entire colon, multiple polyps in sigmoid, at splenic flexure, and ascending colon, not all polyps removed. Tubular adenomas and inflammatory polyps. Needs 3 year surveilance  . EPIDURAL BLOCK INJECTION  2020  . ESOPHAGOGASTRODUODENOSCOPY (EGD) WITH PROPOFOL N/A 02/02/2017   Dr. Gala Romney: esophageal stenosis s/p dilation and biopsy, medium sized hiatal hernia, normal duodenum  . ESOPHAGOGASTRODUODENOSCOPY (EGD) WITH PROPOFOL N/A 07/30/2017   Dr. Gala Romney: esophageal stenosis s/p dilation and biopsy, medium-sized hiatal hernia, normal duodenum  . HEMORRHOID SURGERY  01-21-2008     [facility]  . MALONEY DILATION N/A 02/02/2017   Procedure: Venia Minks DILATION;  Surgeon: Daneil Dolin, MD;  [facility];  Service: Endoscopy;  Laterality: N/A;  . Venia Minks DILATION N/A 07/30/2017   Procedure: Venia Minks DILATION;  Surgeon: Daneil Dolin, MD;  [facility];  Service: Endoscopy;  Laterality: N/A;  . POLYPECTOMY  05/03/2018   Procedure: POLYPECTOMY;  Surgeon: Daneil Dolin, MD;  [facility];  Service: Endoscopy;;  ascending colon polyp, sigmoid colon polyps (multiple)  . RADIOACTIVE SEED IMPLANT N/A 04/23/2017   Procedure: RADIOACTIVE SEED IMPLANT/BRACHYTHERAPY IMPLANT;  Surgeon: Franchot Gallo, MD;  Location: [facility];  Service: Urology;  Laterality: N/A;  . SHOULDER ARTHROSCOPY  08/05/2003  . SPACE OAR INSTILLATION N/A 04/23/2017   Procedure: SPACE OAR INSTILLATION;  Surgeon: Franchot Gallo, MD;  Location[facility];  Service: Urology;  Laterality: N/A;  . TRANSTHORACIC ECHOCARDIOGRAM  03/28/2009   mild LVH, ef 55-60%/ mild aorta calcification  . VASECTOMY  08/05/1983  . VIDEO ASSISTED THORACOSCOPY (VATS)/DECORTICATION Left     Home Medications:  Allergies as of 01/17/2020   No Known Allergies     Medication List       Accurate as of January 17, 2020  2:41 PM. If you have any questions, ask your nurse or doctor.        albuterol 108 (90 Base) MCG/ACT inhaler Commonly known as: VENTOLIN HFA Inhale 2 puffs into the lungs every 4 (four) hours as needed for wheezing or shortness of breath.   albuterol (2.5 MG/3ML) 0.083% nebulizer solution Commonly known as: PROVENTIL Take 3 mLs (2.5 mg total) by nebulization every 6 (six) hours as needed for wheezing or shortness of breath.   amLODipine 5 MG tablet Commonly known as: NORVASC Take 1 tablet (5 mg total) by mouth daily.   aspirin EC 81 MG tablet Take 81 mg by mouth daily.   cyanocobalamin 1000 MCG tablet Take 1 tablet (1,000 mcg total) by  mouth daily.   doxycycline 50 MG capsule Commonly known as: VIBRAMYCIN Take 50 mg by mouth daily.   doxycycline 100 MG capsule Commonly known as: VIBRAMYCIN Take 100 mg by mouth 2 (two) times daily.   pantoprazole 40 MG tablet Commonly known as: PROTONIX TAKE 1 TABLET(40 MG) BY MOUTH DAILY 30 MINUTES BEFORE BREAKFAST What changed: See the new instructions.   Trelegy Ellipta 100-62.5-25 MCG/INH Aepb Generic drug: Fluticasone-Umeclidin-Vilant Inhale 1 puff into the lungs daily.   TYLENOL 500 MG tablet Generic drug: acetaminophen Take 500 mg by mouth every 6 (six) hours as needed for mild pain or headache.       Allergies: No Known Allergies  Family History  Problem Relation Age of Onset  . Diabetes Mother   . Heart disease Father   . Heart disease Brother   . Cancer Neg Hx   .  Colon cancer Neg Hx   . Colon polyps Neg Hx   . Neuropathy Neg Hx     Social History:  reports that he quit smoking about 4 years ago. His smoking use included cigarettes and e-cigarettes. He has a 30.00 pack-year smoking history. He has never used smokeless tobacco. He reports current alcohol use of about 21.0 standard drinks of alcohol per week. He reports that he does not use drugs.  ROS: Urological Symptom Review  Patient is experiencing the following symptoms: Frequent urination Hard to postpone urination Get up at night to urinate Review of Systems Gastrointestinal (upper)  : Negative for upper GI symptoms Gastrointestinal (lower) : Negative for lower GI symptoms Constitutional : Night Sweats Skin: Negative for skin symptoms Eyes: Negative for eye symptoms Ear/Nose/Throat : Negative for Ear/Nose/Throat symptoms Hematologic/Lymphatic: Negative for Hematologic/Lymphatic symptoms Cardiovascular : Leg swelling Respiratory : Negative for respiratory symptoms Endocrine: Negative for endocrine symptoms Musculoskeletal: Negative for musculoskeletal  symptoms Neurological: Negative for neurological symptoms Psychologic: Negative for psychiatric symptoms  Physical Exam:  Vital signs in last 24 hours: BP 135/76   Pulse 97   Temp 98.1 F (36.7 C)   Ht 6' (1.829 m)   Wt 230 lb (104.3 kg)   BMI 31.19 kg/m  Constitutional:  Alert and oriented, No acute distress, Obese Cardiovascular: Regular rate  Respiratory: Normal respiratory effort GI: Abdomen is soft, nontender, nondistended, no abdominal masses. No CVAT. No hernias.  Genitourinary: Normal male phallus, testes are descended bilaterally and non-tender and without masses, scrotum is normal in appearance without lesions or masses, perineum is normal on inspection. Distal peyronies plaques. Prostate smooth and flat.  Lymphatic: No lymphadenopathy Neurologic: Grossly intact, no focal deficits Psychiatric: Normal mood and affect  Laboratory Data:  No results for input(s): WBC, HGB, HCT, PLT in the last 72 hours.  No results for input(s): NA, K, CL, GLUCOSE, BUN, CALCIUM, CREATININE in the last 72 hours.  Invalid input(s): CO3   Results for orders placed or performed in visit on 01/17/20 (from the past 24 hour(s))  POCT urinalysis dipstick     Status: Abnormal   Collection Time: 01/17/20  2:22 PM  Result Value Ref Range   Color, UA yellow    Clarity, UA clear    Glucose, UA Negative Negative   Bilirubin, UA neg    Ketones, UA neg    Spec Grav, UA 1.025 1.010 - 1.025   Blood, UA neg    pH, UA 5.0 5.0 - 8.0   Protein, UA Negative Negative   Urobilinogen, UA negative (A) 0.2 or 1.0 E.U./dL   Nitrite, UA neg    Leukocytes, UA Negative Negative   Appearance clear    Odor     No results found for this or any previous visit (from the past 240 hour(s)).  Renal Function: No results for input(s): CREATININE in the last 168 hours. CrCl cannot be calculated (Patient's most recent lab result is older than the maximum 21 days allowed.).  Radiologic Imaging: No results  found.  Impression/Assessment:  His PSA remains low -- now undetectable. Minimal urinary complaints. Diminished size of penis could be secondary to radiation but may also be exacerbated by weight gain.   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   Plan:  1. Information provided on RestoreX for treatment of peyronies and penile shortening secondary to radiation.   2. Start trial of sildenafil   3. Return in 1 yr for OV w/ PSA

## 2020-01-31 ENCOUNTER — Ambulatory Visit: Payer: 59 | Admitting: Urology

## 2020-02-14 ENCOUNTER — Ambulatory Visit: Payer: 59 | Admitting: Urology

## 2020-03-13 ENCOUNTER — Other Ambulatory Visit: Payer: Self-pay

## 2020-03-13 ENCOUNTER — Inpatient Hospital Stay (HOSPITAL_COMMUNITY): Payer: 59 | Attending: Hematology

## 2020-03-13 DIAGNOSIS — D6851 Activated protein C resistance: Secondary | ICD-10-CM | POA: Diagnosis not present

## 2020-03-13 DIAGNOSIS — Z8546 Personal history of malignant neoplasm of prostate: Secondary | ICD-10-CM | POA: Diagnosis not present

## 2020-03-13 DIAGNOSIS — I82431 Acute embolism and thrombosis of right popliteal vein: Secondary | ICD-10-CM

## 2020-03-13 DIAGNOSIS — Z96641 Presence of right artificial hip joint: Secondary | ICD-10-CM | POA: Insufficient documentation

## 2020-03-13 DIAGNOSIS — Z86718 Personal history of other venous thrombosis and embolism: Secondary | ICD-10-CM | POA: Insufficient documentation

## 2020-03-13 DIAGNOSIS — R7989 Other specified abnormal findings of blood chemistry: Secondary | ICD-10-CM | POA: Insufficient documentation

## 2020-03-13 DIAGNOSIS — Z87891 Personal history of nicotine dependence: Secondary | ICD-10-CM | POA: Diagnosis not present

## 2020-03-13 LAB — CBC WITH DIFFERENTIAL/PLATELET
Abs Immature Granulocytes: 0.08 10*3/uL — ABNORMAL HIGH (ref 0.00–0.07)
Basophils Absolute: 0.1 10*3/uL (ref 0.0–0.1)
Basophils Relative: 1 %
Eosinophils Absolute: 0.2 10*3/uL (ref 0.0–0.5)
Eosinophils Relative: 3 %
HCT: 41.9 % (ref 39.0–52.0)
Hemoglobin: 14.2 g/dL (ref 13.0–17.0)
Immature Granulocytes: 1 %
Lymphocytes Relative: 29 %
Lymphs Abs: 2.2 10*3/uL (ref 0.7–4.0)
MCH: 34.5 pg — ABNORMAL HIGH (ref 26.0–34.0)
MCHC: 33.9 g/dL (ref 30.0–36.0)
MCV: 101.9 fL — ABNORMAL HIGH (ref 80.0–100.0)
Monocytes Absolute: 0.8 10*3/uL (ref 0.1–1.0)
Monocytes Relative: 11 %
Neutro Abs: 4.2 10*3/uL (ref 1.7–7.7)
Neutrophils Relative %: 55 %
Platelets: 167 10*3/uL (ref 150–400)
RBC: 4.11 MIL/uL — ABNORMAL LOW (ref 4.22–5.81)
RDW: 13.1 % (ref 11.5–15.5)
WBC: 7.6 10*3/uL (ref 4.0–10.5)
nRBC: 0 % (ref 0.0–0.2)

## 2020-03-13 LAB — D-DIMER, QUANTITATIVE: D-Dimer, Quant: 0.98 ug/mL-FEU — ABNORMAL HIGH (ref 0.00–0.50)

## 2020-03-14 ENCOUNTER — Inpatient Hospital Stay (HOSPITAL_BASED_OUTPATIENT_CLINIC_OR_DEPARTMENT_OTHER): Payer: 59 | Admitting: Hematology

## 2020-03-14 ENCOUNTER — Other Ambulatory Visit: Payer: Self-pay

## 2020-03-14 VITALS — BP 152/87 | HR 77 | Temp 97.8°F | Resp 18 | Wt 235.3 lb

## 2020-03-14 DIAGNOSIS — Z86718 Personal history of other venous thrombosis and embolism: Secondary | ICD-10-CM | POA: Diagnosis not present

## 2020-03-14 DIAGNOSIS — I82431 Acute embolism and thrombosis of right popliteal vein: Secondary | ICD-10-CM | POA: Diagnosis not present

## 2020-03-14 NOTE — Patient Instructions (Signed)
[facility] at [facility] Discharge Instructions  You were seen today by Dr. Katragadda. He went over your recent results. Dr. Katragadda will see you back in 1 year for labs and follow up.   Thank you for choosing [facility] at [facility] to provide your oncology and hematology care.  To afford each patient quality time with our provider, please arrive at least 15 minutes before your scheduled appointment time.   If you have a lab appointment with the Cancer Center please come in thru the Main Entrance and check in at the main information desk  You need to re-schedule your appointment should you arrive 10 or more minutes late.  We strive to give you quality time with our providers, and arriving late affects you and other patients whose appointments are after yours.  Also, if you no show three or more times for appointments you may be dismissed from the clinic at the providers discretion.     Again, thank you for choosing [facility].  Our hope is that these requests will decrease the amount of time that you wait before being seen by our physicians.       _____________________________________________________________  Should you have questions after your visit to [facility], please contact our office at [phone] between the hours of 8:00 a.m. and 4:30 p.m.  Voicemails left after 4:00 p.m. will not be returned until the following business day.  For prescription refill requests, have your pharmacy contact our office and allow 72 hours.    Cancer Center Support Programs:   > Cancer Support Group  2nd Tuesday of the month 1pm-2pm, Journey Room    

## 2020-03-14 NOTE — Progress Notes (Signed)
[address], [place] [address]   CLINIC:  Medical Oncology/Hematology  PCP:  [name], MD [address] / [place] [address]  [phone]  REASON FOR VISIT:  Follow-up for DVT of right lower leg  PRIOR THERAPY: Xarelto from 05/2018 to 09/2018.  CURRENT THERAPY: Observation  INTERVAL HISTORY:  [name], a 64 y.o. male, returns for routine follow-up for his DVT of right lower leg. [name] was last contacted via phone on 09/14/2019.  Today he complains of discoloration in both lower legs, with pitting in the skin, but it is not painful. He elevates his legs every evening for about 15 minutes; he also gets cramps every night when he lays down and has to get up and walk it off. He denies any other issues since his last office visit. He stays hydrated and takes a multi-vitamin every day.  He quit smoking 10 years ago.   REVIEW OF SYSTEMS:  Review of Systems  Constitutional: Positive for fatigue (mild). Negative for appetite change.  All other systems reviewed and are negative.   PAST MEDICAL/SURGICAL HISTORY:  Past Medical History:  Diagnosis Date  . Bilateral shoulder bursitis   . Chronic back pain   . COPD (chronic obstructive pulmonary disease) with emphysema ([place])   . DDD (degenerative disc disease), lumbar    hx epideral injection  L5--S1   . DVT (deep venous thrombosis) ([place]) 11/19/2018  . Dysphasia    intermittant w/ food   . Dyspnea   . Dyspnea on exertion   . GERD (gastroesophageal reflux disease)   . Gout    L great toe  . Hiatal hernia   . Hip problem 2020   left, seeing orthopedics  . History of acute pancreatitis    alcoholic pancreatitis [id number] and 01-30-2012  . History of colon polyps    10-17-2005  hyperplastic polyp's  . History of esophageal stricture    s/p  dilation 02-02-2017  . History of peptic ulcer 1990s  . History of pneumococcal pneumonia 2006   bilateral pneumonia w/  left lower lobe abscess treated w/ chest tube with suction for drainage  . History of pneumothorax    1984--  left spontaneous pneumothorax ,  treated w/ chest tube  . Hypertension   . Malignant neoplasm of prostate [facility]) urologist-  dr dahlstedt/  oncologist -- dr Tammi Klippel   dx 12-02-2016-- Stage T2b, Gleason 3+4,  PSA 5.99,  vol 25cc  . Numbness of left foot    outer left ankle numb-- per pt has appt. w/ neurologist  . OSA (obstructive sleep apnea)    per pt has not used cpap over year ago from 04-16-2017--- per study 01-17-2010  severe osa  . Solid nodule of lung greater than 8 mm in diameter 05/11/2018   03/2018: RUL Nodule, 79mm  . Wears glasses    Past Surgical History:  Procedure Laterality Date  . BIOPSY  02/02/2017   Procedure: BIOPSY;  Surgeon: Daneil Dolin, MD;  [facility];  Service: Endoscopy;;  duodenal bx's, esophgeal bx's  . BIOPSY  07/30/2017   Procedure: BIOPSY;  Surgeon: Daneil Dolin, MD;  [facility];  Service: Endoscopy;;  esophagus  . CARDIOVASCULAR STRESS TEST  09/12/2009   normal nuclear perfusion study w/ no ischemia /  normal LV function and wall motion , ef 57%  . COLONOSCOPY  2007   Dr. Gala Romney: hyperplastic polyps, internal hemorrhoids   .  COLONOSCOPY WITH PROPOFOL N/A 05/03/2018   Dr. Gala Romney: diverticulosis in entire colon, multiple polyps in sigmoid, at splenic flexure, and ascending colon, not all polyps removed. Tubular adenomas and inflammatory polyps. Needs 3 year surveilance  . EPIDURAL BLOCK INJECTION  2020  . ESOPHAGOGASTRODUODENOSCOPY (EGD) WITH PROPOFOL N/A 02/02/2017   Dr. Gala Romney: esophageal stenosis s/p dilation and biopsy, medium sized hiatal hernia, normal duodenum  . ESOPHAGOGASTRODUODENOSCOPY (EGD) WITH PROPOFOL N/A 07/30/2017   Dr. Gala Romney: esophageal stenosis s/p dilation and biopsy, medium-sized hiatal hernia, normal duodenum  . HEMORRHOID SURGERY  01-21-2008    [facility]  . MALONEY DILATION N/A 02/02/2017   Procedure:  Venia Minks DILATION;  Surgeon: Daneil Dolin, MD;  [facility];  Service: Endoscopy;  Laterality: N/A;  . Venia Minks DILATION N/A 07/30/2017   Procedure: Venia Minks DILATION;  Surgeon: Daneil Dolin, MD;  [facility];  Service: Endoscopy;  Laterality: N/A;  . POLYPECTOMY  05/03/2018   Procedure: POLYPECTOMY;  Surgeon: Daneil Dolin, MD;  [facility];  Service: Endoscopy;;  ascending colon polyp, sigmoid colon polyps (multiple)  . RADIOACTIVE SEED IMPLANT N/A 04/23/2017   Procedure: RADIOACTIVE SEED IMPLANT/BRACHYTHERAPY IMPLANT;  Surgeon: Franchot Gallo, MD;  Location: [facility];  Service: Urology;  Laterality: N/A;  . SHOULDER ARTHROSCOPY  08/05/2003  . SPACE OAR INSTILLATION N/A 04/23/2017   Procedure: SPACE OAR INSTILLATION;  Surgeon: Franchot Gallo, MD;  Location: [facility];  Service: Urology;  Laterality: N/A;  . TRANSTHORACIC ECHOCARDIOGRAM  03/28/2009   mild LVH, ef 55-60%/ mild aorta calcification  . VASECTOMY  08/05/1983  . VIDEO ASSISTED THORACOSCOPY (VATS)/DECORTICATION Left     SOCIAL HISTORY:  Social History   Socioeconomic History  . Marital status: Married    Spouse name: Not on file  . Number of children: 2  . Years of education: Not on file  . Highest education level: Bachelor's degree (e.g., BA, AB, BS)  Occupational History  . Not on file  Tobacco Use  . Smoking status: Former Smoker    Packs/day: 1.00    Years: 30.00    Pack years: 30.00    Types: Cigarettes, E-cigarettes    Quit date: 10/03/2015    Years since quitting: 4.4  . Smokeless tobacco: Never Used  . Tobacco comment: stoped vaping 2018  Vaping Use  . Vaping Use: Former  Substance and Sexual Activity  . Alcohol use: Yes    Alcohol/week: 21.0 standard drinks    Types: 21 Shots of liquor per week    Comment: 2-3 drinks daily (Wine); 01/04/19 pt states he hasn't had anything to drink in 2-3 months  . Drug use: No  . Sexual  activity: Yes    Birth control/protection: None  Other Topics Concern  . Not on file  Social History Narrative   Lives at home with his wife   Right handed   Social Determinants of Health   Financial Resource Strain:   . Difficulty of Paying Living Expenses:   Food Insecurity:   . Worried About Charity fundraiser in the Last Year:   . Arboriculturist in the Last Year:   Transportation Needs:   . Film/video editor (Medical):   Marland Kitchen Lack of Transportation (Non-Medical):   Physical Activity:   . Days of Exercise per Week:   . Minutes of Exercise per Session:   Stress:   . Feeling of Stress :   Social Connections:   . Frequency of  Communication with Friends and Family:   . Frequency of Social Gatherings with Friends and Family:   . Attends Religious Services:   . Active Member of Clubs or Organizations:   . Attends Archivist Meetings:   Marland Kitchen Marital Status:   Intimate Partner Violence:   . Fear of Current or Ex-Partner:   . Emotionally Abused:   Marland Kitchen Physically Abused:   . Sexually Abused:     FAMILY HISTORY:  Family History  Problem Relation Age of Onset  . Diabetes Mother   . Heart disease Father   . Heart disease Brother   . Cancer Neg Hx   . Colon cancer Neg Hx   . Colon polyps Neg Hx   . Neuropathy Neg Hx     CURRENT MEDICATIONS:  Current Outpatient Medications  Medication Sig Dispense Refill  . aspirin EC 81 MG tablet Take 81 mg by mouth daily.    Marland Kitchen doxycycline (VIBRAMYCIN) 50 MG capsule Take 50 mg by mouth daily.    . Fluticasone-Umeclidin-Vilant (TRELEGY ELLIPTA) 100-62.5-25 MCG/INH AEPB Inhale 1 puff into the lungs daily. 60 each 11  . albuterol (PROVENTIL HFA;VENTOLIN HFA) 108 (90 Base) MCG/ACT inhaler Inhale 2 puffs into the lungs every 4 (four) hours as needed for wheezing or shortness of breath. (Patient not taking: Reported on 01/17/2020) 1 Inhaler 5  . albuterol (PROVENTIL) (2.5 MG/3ML) 0.083% nebulizer solution Take 3 mLs (2.5 mg total) by  nebulization every 6 (six) hours as needed for wheezing or shortness of breath. (Patient not taking: Reported on 03/14/2020) 300 mL 5  . predniSONE (DELTASONE) 5 MG tablet prednisone 5 mg tablets in a dose pack  FOLLOW PACKAGE DIRECTIONS (Patient not taking: No sig reported)     No current facility-administered medications for this visit.    ALLERGIES:  No Known Allergies  PHYSICAL EXAM:  Performance status (ECOG): 0 - Asymptomatic  Vitals:   03/14/20 1428  BP: (!) 152/87  Pulse: 77  Resp: 18  Temp: 97.8 F (36.6 C)  SpO2: 97%   Wt Readings from Last 3 Encounters:  03/14/20 235 lb 4.8 oz (106.7 kg)  01/17/20 230 lb (104.3 kg)  10/10/19 230 lb (104.3 kg)   Physical Exam Vitals reviewed.  Constitutional:      Appearance: Normal appearance. He is obese.  Cardiovascular:     Rate and Rhythm: Normal rate and regular rhythm.     Pulses: Normal pulses.     Heart sounds: Normal heart sounds.  Pulmonary:     Effort: Pulmonary effort is normal.     Breath sounds: Normal breath sounds.  Abdominal:     Palpations: Abdomen is soft. There is no hepatomegaly, splenomegaly or mass.     Tenderness: There is no abdominal tenderness.     Hernia: No hernia is present.  Musculoskeletal:     Right lower leg: Edema (trace) present.     Left lower leg: No edema.  Lymphadenopathy:     Cervical: No cervical adenopathy.     Upper Body:     Right upper body: No supraclavicular or axillary adenopathy.     Left upper body: No supraclavicular or axillary adenopathy.  Skin:    Comments: Chronic venous stasis in BLE  Neurological:     General: No focal deficit present.     Mental Status: He is alert and oriented to person, place, and time.  Psychiatric:        Mood and Affect: Mood normal.  Behavior: Behavior normal.     LABORATORY DATA:  I have reviewed the labs as listed.  CBC Latest Ref Rng & Units 03/13/2020 10/10/2019 09/07/2019  WBC 4.0 - 10.5 K/uL 7.6 11.1(H) 6.8  Hemoglobin  13.0 - 17.0 g/dL 14.2 17.0 14.4  Hematocrit 39 - 52 % 41.9 50.2 43.0  Platelets 150 - 400 K/uL 167 175 266   CMP Latest Ref Rng & Units 10/13/2019 10/12/2019 10/11/2019  Glucose 70 - 99 mg/dL 112(H) 130(H) 137(H)  BUN 8 - 23 mg/dL 13 13 17   Creatinine 0.61 - 1.24 mg/dL 0.74 0.78 0.81  Sodium 135 - 145 mmol/L 136 135 135  Potassium 3.5 - 5.1 mmol/L 3.5 3.5 3.8  Chloride 98 - 111 mmol/L 103 101 100  CO2 22 - 32 mmol/L 27 26 26   Calcium 8.9 - 10.3 mg/dL 8.1(L) 8.5(L) 8.5(L)  Total Protein 6.5 - 8.1 g/dL 6.1(L) - 6.4(L)  Total Bilirubin 0.3 - 1.2 mg/dL 1.0 - 1.2  Alkaline Phos 38 - 126 U/L 49 - 56  AST 15 - 41 U/L 22 - 30  ALT 0 - 44 U/L 17 - 19      Component Value Date/Time   RBC 4.11 (L) 03/13/2020 1125   MCV 101.9 (H) 03/13/2020 1125   MCH 34.5 (H) 03/13/2020 1125   MCHC 33.9 03/13/2020 1125   RDW 13.1 03/13/2020 1125   LYMPHSABS 2.2 03/13/2020 1125   MONOABS 0.8 03/13/2020 1125   EOSABS 0.2 03/13/2020 1125   BASOSABS 0.1 03/13/2020 1125   Lab Results  Component Value Date   PSA <0.1 01/10/2020    DIAGNOSTIC IMAGING:  I have independently reviewed the scans and discussed with the patient. No results found.   ASSESSMENT:  1.  Weakly provoked right leg DVT: -Xarelto from October 2019 through February 2020. -He is also heterozygous for factor V Leiden. -He has mildly elevated D-dimer of around 2.3 since he discontinued anticoagulation. -Lower extremity Doppler on 05/31/2019 was negative.  2.  Prostate cancer: -TRUS/biopsy on 12/02/2016.  4 mm left apical nodule, PSA 5.99.  6/12 cores positive for adenocarcinoma, all on the left side of the prostate.  3 cores revealed GS 3+3, 3 cores with GS 3+4.  He underwent I-125 brachytherapy on 04/23/2017. -Latest PSA on 01/10/2020 was less than 0.1.   PLAN:  1.  Weakly provoked right leg DVT: -He had elevated D-dimer since he discontinued anticoagulation.  Today D-dimer has improved to 0.98. -No clinical signs or symptoms of  recurrence. -We will continue to monitor once a year at this time.  Orders placed this encounter:  No orders of the defined types were placed in this encounter.    Derek Jack, MD [place] [phone]   I, Milinda Antis, am acting as a scribe for Dr. Sanda Linger.  I, Derek Jack MD, have reviewed the above documentation for accuracy and completeness, and I agree with the above.

## 2020-04-24 ENCOUNTER — Telehealth: Payer: Self-pay | Admitting: Pulmonary Disease

## 2020-04-24 NOTE — Telephone Encounter (Signed)
PA request received from [facility]   Drug requested: Trelegy CMM Key: [id number] Tried/failed: on file Covered alternatives:  PA request has been sent to plan, and a determination is expected within 3 days.   Routing to [facility] for follow-up.

## 2020-04-27 NOTE — Telephone Encounter (Signed)
The requested medication TRELEGY and/or diagnosis are not a covered benefit and excluded from coverage in accordance with the terms and conditions of your plan benefit. Therefore, the request has been administratively denied.  Would you like you recommend another inhaler?

## 2020-04-27 NOTE — Telephone Encounter (Signed)
PCCM:  When these issues happen we need a list of approved medications by the patients insurance.   Any triple therapy inhaler option would be fine if covered. If not we will have to prescribe two inhalers.  [facility] 04/27/2020 9:04 PM

## 2020-04-30 NOTE — Telephone Encounter (Signed)
ATC patient to ask him to get formulary from insurance for inhalers LMTCB

## 2020-05-01 NOTE — Telephone Encounter (Signed)
Called and spoke with patient to see if he got my message yesterday. He states he did and that there was some changes with his insurance though his job. He states he is still getting Trelegy and is paying for it but it is not a lot and he is ok with paying for it. Told patient that if at any point it gets to be to much or to expensive to call and let us know and we could do [place] patient assistance paperwork or switch him to something else at that time. Patient expressed understanding. Nothing further needed at this time.

## 2020-05-20 ENCOUNTER — Other Ambulatory Visit: Payer: Self-pay | Admitting: Gastroenterology

## 2020-05-23 ENCOUNTER — Ambulatory Visit
Admission: EM | Admit: 2020-05-23 | Discharge: 2020-05-23 | Disposition: A | Discharge status: Home / Self Care | Payer: 59 | Attending: Emergency Medicine | Admitting: Emergency Medicine

## 2020-05-23 ENCOUNTER — Encounter: Payer: Self-pay | Admitting: Emergency Medicine

## 2020-05-23 ENCOUNTER — Other Ambulatory Visit: Payer: Self-pay

## 2020-05-23 ENCOUNTER — Telehealth: Payer: Self-pay | Admitting: Emergency Medicine

## 2020-05-23 DIAGNOSIS — M25511 Pain in right shoulder: Secondary | ICD-10-CM

## 2020-05-23 MED ORDER — PREDNISONE 10 MG (21) PO TBPK: ORAL_TABLET | Freq: Every day | ORAL | 0 refills | Status: DC

## 2020-05-23 MED ORDER — DEXAMETHASONE SODIUM PHOSPHATE 10 MG/ML IJ SOLN
10.0000 mg | Freq: Once | INTRAMUSCULAR | Status: AC
  Administered 2020-05-23: 10 mg via INTRAMUSCULAR

## 2020-05-23 MED ORDER — TIZANIDINE HCL 2 MG PO CAPS: 2.0000 mg | ORAL_CAPSULE | Freq: Three times a day (TID) | ORAL | 0 refills | Status: DC

## 2020-05-23 NOTE — Discharge Instructions (Signed)
Decadron shot given in office Continue conservative management of rest, ice, and gentle stretches Prednisone prescribed.  Take as directed and to completion Take zanaflex at nighttime for symptomatic relief. Avoid driving or operating heavy machinery while using medication. Follow up with orthopedist Return or go to the ER if you have any new or worsening symptoms (fever, chills, chest pain, redness, swelling, bruising, deformity, etc...)

## 2020-05-23 NOTE — ED Provider Notes (Signed)
[place]   [id number] 05/23/20 Arrival Time: 8182  CC: Right shoulder pain  SUBJECTIVE: History from: patient. [name] is a 64 y.o. male complains of RT shoulder pain x 1 day.  Denies a precipitating event or specific injury.  Localizes the pain to the top of shoulder.  Describes the pain as intermittent and "pins and needles" in character.  Has tried OTC medications without relief.  Symptoms are made worse with shoulder ROM.  Denies similar symptoms in the past.  Denies fever, chills, erythema, ecchymosis, effusion, weakness.    ROS: As per HPI.  All other pertinent ROS negative.     Past Medical History:  Diagnosis Date  . Bilateral shoulder bursitis   . Chronic back pain   . COPD (chronic obstructive pulmonary disease) with emphysema ([place])   . DDD (degenerative disc disease), lumbar    hx epideral injection  L5--S1   . DVT (deep venous thrombosis) ([place]) 11/19/2018  . Dysphasia    intermittant w/ food   . Dyspnea   . Dyspnea on exertion   . GERD (gastroesophageal reflux disease)   . Gout    L great toe  . Hiatal hernia   . Hip problem 2020   left, seeing orthopedics  . History of acute pancreatitis    alcoholic pancreatitis [id number] and 01-30-2012  . History of colon polyps    10-17-2005  hyperplastic polyp's  . History of esophageal stricture    s/p  dilation 02-02-2017  . History of peptic ulcer 1990s  . History of pneumococcal pneumonia 2006   bilateral pneumonia w/ left lower lobe abscess treated w/ chest tube with suction for drainage  . History of pneumothorax    1984--  left spontaneous pneumothorax ,  treated w/ chest tube  . Hypertension   . Malignant neoplasm of prostate [facility]) urologist-  dr dahlstedt/  oncologist -- dr Tammi Klippel   dx 12-02-2016-- Stage T2b, Gleason 3+4,  PSA 5.99,  vol 25cc  . Numbness of left foot    outer left ankle numb-- per pt has appt. w/ neurologist  . OSA (obstructive sleep apnea)    per pt has not used cpap  over year ago from 04-16-2017--- per study 01-17-2010  severe osa  . Solid nodule of lung greater than 8 mm in diameter 05/11/2018   03/2018: RUL Nodule, 32mm  . Wears glasses    Past Surgical History:  Procedure Laterality Date  . BIOPSY  02/02/2017   Procedure: BIOPSY;  Surgeon: Daneil Dolin, MD;  [facility];  Service: Endoscopy;;  duodenal bx's, esophgeal bx's  . BIOPSY  07/30/2017   Procedure: BIOPSY;  Surgeon: Daneil Dolin, MD;  [facility];  Service: Endoscopy;;  esophagus  . CARDIOVASCULAR STRESS TEST  09/12/2009   normal nuclear perfusion study w/ no ischemia /  normal LV function and wall motion , ef 57%  . COLONOSCOPY  2007   Dr. Gala Romney: hyperplastic polyps, internal hemorrhoids   . COLONOSCOPY WITH PROPOFOL N/A 05/03/2018   Dr. Gala Romney: diverticulosis in entire colon, multiple polyps in sigmoid, at splenic flexure, and ascending colon, not all polyps removed. Tubular adenomas and inflammatory polyps. Needs 3 year surveilance  . EPIDURAL BLOCK INJECTION  2020  . ESOPHAGOGASTRODUODENOSCOPY (EGD) WITH PROPOFOL N/A 02/02/2017   Dr. Gala Romney: esophageal stenosis s/p dilation and biopsy, medium sized hiatal hernia, normal duodenum  . ESOPHAGOGASTRODUODENOSCOPY (EGD) WITH PROPOFOL N/A 07/30/2017   Dr. Gala Romney: esophageal stenosis s/p dilation and biopsy,  medium-sized hiatal hernia, normal duodenum  . HEMORRHOID SURGERY  01-21-2008    [facility]  . MALONEY DILATION N/A 02/02/2017   Procedure: Venia Minks DILATION;  Surgeon: Daneil Dolin, MD;  [facility];  Service: Endoscopy;  Laterality: N/A;  . Venia Minks DILATION N/A 07/30/2017   Procedure: Venia Minks DILATION;  Surgeon: Daneil Dolin, MD;  [facility];  Service: Endoscopy;  Laterality: N/A;  . POLYPECTOMY  05/03/2018   Procedure: POLYPECTOMY;  Surgeon: Daneil Dolin, MD;  [facility];  Service: Endoscopy;;  ascending colon polyp, sigmoid colon polyps (multiple)  . RADIOACTIVE SEED  IMPLANT N/A 04/23/2017   Procedure: RADIOACTIVE SEED IMPLANT/BRACHYTHERAPY IMPLANT;  Surgeon: Franchot Gallo, MD;  Location: [facility];  Service: Urology;  Laterality: N/A;  . SHOULDER ARTHROSCOPY  08/05/2003  . SPACE OAR INSTILLATION N/A 04/23/2017   Procedure: SPACE OAR INSTILLATION;  Surgeon: Franchot Gallo, MD;  Location: [facility];  Service: Urology;  Laterality: N/A;  . TRANSTHORACIC ECHOCARDIOGRAM  03/28/2009   mild LVH, ef 55-60%/ mild aorta calcification  . VASECTOMY  08/05/1983  . VIDEO ASSISTED THORACOSCOPY (VATS)/DECORTICATION Left    No Known Allergies No current facility-administered medications on file prior to encounter.   Current Outpatient Medications on File Prior to Encounter  Medication Sig Dispense Refill  . aspirin EC 81 MG tablet Take 81 mg by mouth daily.    . Fluticasone-Umeclidin-Vilant (TRELEGY ELLIPTA) 100-62.5-25 MCG/INH AEPB Inhale 1 puff into the lungs daily. 60 each 11  . [DISCONTINUED] albuterol (PROVENTIL) (2.5 MG/3ML) 0.083% nebulizer solution Take 3 mLs (2.5 mg total) by nebulization every 6 (six) hours as needed for wheezing or shortness of breath. (Patient not taking: Reported on 03/14/2020) 300 mL 5   Social History   Socioeconomic History  . Marital status: Married    Spouse name: Not on file  . Number of children: 2  . Years of education: Not on file  . Highest education level: Bachelor's degree (e.g., BA, AB, BS)  Occupational History  . Not on file  Tobacco Use  . Smoking status: Former Smoker    Packs/day: 1.00    Years: 30.00    Pack years: 30.00    Types: Cigarettes, E-cigarettes    Quit date: 10/03/2015    Years since quitting: 4.6  . Smokeless tobacco: Never Used  . Tobacco comment: stoped vaping 2018  Vaping Use  . Vaping Use: Former  Substance and Sexual Activity  . Alcohol use: Yes    Alcohol/week: 21.0 standard drinks    Types: 21 Shots of liquor per week    Comment: 2-3 drinks daily  (Wine); 01/04/19 pt states he hasn't had anything to drink in 2-3 months  . Drug use: No  . Sexual activity: Yes    Birth control/protection: None  Other Topics Concern  . Not on file  Social History Narrative   Lives at home with his wife   Right handed   Social Determinants of Health   Financial Resource Strain:   . Difficulty of Paying Living Expenses: Not on file  Food Insecurity:   . Worried About Charity fundraiser in the Last Year: Not on file  . Ran Out of Food in the Last Year: Not on file  Transportation Needs:   . Lack of Transportation (Medical): Not on file  . Lack of Transportation (Non-Medical): Not on file  Physical Activity:   . Days of Exercise per Week: Not on file  .  Minutes of Exercise per Session: Not on file  Stress:   . Feeling of Stress : Not on file  Social Connections:   . Frequency of Communication with Friends and Family: Not on file  . Frequency of Social Gatherings with Friends and Family: Not on file  . Attends Religious Services: Not on file  . Active Member of Clubs or Organizations: Not on file  . Attends Archivist Meetings: Not on file  . Marital Status: Not on file  Intimate Partner Violence:   . Fear of Current or Ex-Partner: Not on file  . Emotionally Abused: Not on file  . Physically Abused: Not on file  . Sexually Abused: Not on file   Family History  Problem Relation Age of Onset  . Diabetes Mother   . Heart disease Father   . Heart disease Brother   . Cancer Neg Hx   . Colon cancer Neg Hx   . Colon polyps Neg Hx   . Neuropathy Neg Hx     OBJECTIVE:  Vitals:   05/23/20 1240 05/23/20 1242  BP:  (!) 154/91  Pulse:  94  Resp:  18  Temp:  98.5 F (36.9 C)  TempSrc:  Oral  SpO2:  94%  Weight: 230 lb (104.3 kg)   Height: 6\' 2"  (1.88 m)     General appearance: ALERT; in no acute distress.  Head: NCAT Lungs: Normal respiratory effort CV: Radial pulse 2+ Musculoskeletal: RT shoulder Inspection: Skin warm,  dry, clear and intact without obvious erythema, effusion, or ecchymosis.  Palpation: TTP over distal superior aspect of shoulder ROM: FROM active and passive Strength: 5/5 shld abduction, 5/5 shld adduction, 5/5 elbow flexion, 5/5 elbow extension, 5/5 grip strength Skin: warm and dry Neurologic: Ambulates without difficulty Psychological: alert and cooperative; normal mood and affect   ASSESSMENT & PLAN:  1. Acute pain of right shoulder     Meds ordered this encounter  Medications  . predniSONE (STERAPRED UNI-PAK 21 TAB) 10 MG (21) TBPK tablet    Sig: Take by mouth daily. Take 6 tabs by mouth daily  for 2 days, then 5 tabs for 2 days, then 4 tabs for 2 days, then 3 tabs for 2 days, 2 tabs for 2 days, then 1 tab by mouth daily for 2 days    Dispense:  42 tablet    Refill:  0    Order Specific Question:   Supervising Provider    Answer:   Raylene Everts [[id number]]  . dexamethasone (DECADRON) injection 10 mg   Decadron shot given in office Continue conservative management of rest, ice, and gentle stretches Prednisone prescribed.  Take as directed and to completion Take zanaflex at nighttime for symptomatic relief. Avoid driving or operating heavy machinery while using medication. Follow up with orthopedist Return or go to the ER if you have any new or worsening symptoms (fever, chills, chest pain, redness, swelling, bruising, deformity, etc...)   Reviewed expectations re: course of current medical issues. Questions answered. Outlined signs and symptoms indicating need for more acute intervention. Patient verbalized understanding. After Visit Summary given.    Lestine Box, PA-C 05/23/20 1258

## 2020-05-23 NOTE — Telephone Encounter (Signed)
zanaflex sent to pharmacy on file

## 2020-05-23 NOTE — ED Triage Notes (Signed)
Pain in RT shoulder with movement. Denies any injury

## 2020-07-12 ENCOUNTER — Telehealth: Payer: Self-pay | Admitting: Pulmonary Disease

## 2020-07-13 NOTE — Telephone Encounter (Signed)
Patient states needs prior authorization for Trelegy. Pharmacy is [facility]. Patient phone number is [phone].

## 2020-07-13 NOTE — Telephone Encounter (Signed)
PA request was received from (pharmacy): Yes, [facility] in [place], [place] Fax: [phone] Medication name and strength: Trelegy Ellipta 100-62.5-25mcg Ordering Provider: Icard  Was PA started with [facility]?: yes If yes, please enter KEY: [id number] Medication tried and failed: Breo Ellipta, Symbicort, Spiriva, Incruse Covered Alternatives: no covered alternatives listed  PA sent to plan, time frame for approval / denial: [employer] is reviewing your PA request. Typically an electronic response will be received within 72 hours. To check for an update later, open this request from your dashboard.   Called and spoke with pt letting him know that the PA for his Trelegy inhaler has been initiated and that we would call him once we have received the response. Pt verbalized understanding. Holding in triage while we wait outcome on the PA.

## 2020-07-16 NOTE — Telephone Encounter (Signed)
Called and spoke with pt letting him know the info stated by Dr. Valeta Harms and also about the PA results and he stated he does want to stay on the Trelegy as it really helps his breathing. Pt stated that he knows that he will not qualify for patient assistance so stated he will call pharmacy to let them know he will pay the cost of Trelegy this month. Pt said cost went from him having to pay $51 one month to now having to pay about $500-700, which could be due to him being in the donut hole. Stated to pt to call us if there was anything he needed and he verbalized understanding. Nothing further needed.

## 2020-07-16 NOTE — Telephone Encounter (Signed)
Can you see if the patient qualifies for med assistance through [facility]? Or does he want to keep paying the higher cost?  Thanks  Garner Nash, DO [facility] 07/16/2020 12:47 PM

## 2020-07-16 NOTE — Telephone Encounter (Signed)
Medication name and strength: Trelegy 100 PA approved/denied: denied If denied, reason for denial:  Why was my request denied?  This request was denied because you did not meet the following clinical requirements:  The requested medication and/or diagnosis are not a covered benefit and excluded from coverage in accordance with the terms and conditions of your plan benefit. Therefore, the request has been administratively denied. The requested medication and/or diagnosis are not a covered benefit and are excluded from coverage in accordance with the terms and conditions of your plan benefit. Therefore, this request has been administratively denied. The reason(s) [employer] did not approve this medication can be found above. This denial is based on our Trelegy Ellipta drug coverage policy, in addition to any supplementary information you or your prescriber may have submitted.   Checked formulary for alternatives and it shows that the Trelegy is a Tier 3 which will make it more expensive.   Per formulary list, other alternatives include Flovent HFA (generic preferred), Flovent Diskus (generic preferred), Alvesco (generic preferred), Arnuity (generic preferred), Serevent Diskus, Spiriva handihaler or Spiriva Respimat, Striverdi Respimat, Bevespi  Anoro Ellipta, Adviar Diskus, Advair HFA, Symbicort, Breo Ellipta, and Breztri  also covered but they are  also Tier 3 which would make them more expensive.  Dr. Valeta Harms, please advise.

## 2020-07-31 ENCOUNTER — Telehealth: Payer: Self-pay

## 2020-07-31 NOTE — Telephone Encounter (Signed)
Pharmacy name: [facility] in [place] Drug requested: Trelegy 100 CMM?: yes Key: [id number] Covered alternatives: none listed Tried and failed: on file Decision: sent to plan.  Determination within 3 days.  Routing to [facility] for follow-up.

## 2020-08-01 NOTE — Telephone Encounter (Signed)
See encounter from 07/12/20. PA was denied.

## 2020-08-10 ENCOUNTER — Inpatient Hospital Stay (HOSPITAL_COMMUNITY)
Admission: EM | Admit: 2020-08-10 | Discharge: 2020-08-22 | DRG: 392 | Disposition: A | Discharge status: Home / Self Care | Payer: 59 | Attending: General Surgery | Admitting: General Surgery

## 2020-08-10 ENCOUNTER — Other Ambulatory Visit: Payer: Self-pay

## 2020-08-10 ENCOUNTER — Emergency Department (HOSPITAL_COMMUNITY): Payer: 59

## 2020-08-10 ENCOUNTER — Ambulatory Visit (HOSPITAL_COMMUNITY)
Admission: EM | Admit: 2020-08-10 | Discharge: 2020-08-10 | Disposition: A | Discharge status: Home / Self Care | Payer: 59 | Attending: Urgent Care | Admitting: Urgent Care

## 2020-08-10 ENCOUNTER — Encounter (HOSPITAL_COMMUNITY): Payer: Self-pay

## 2020-08-10 DIAGNOSIS — R188 Other ascites: Secondary | ICD-10-CM | POA: Diagnosis present

## 2020-08-10 DIAGNOSIS — D6851 Activated protein C resistance: Secondary | ICD-10-CM | POA: Diagnosis present

## 2020-08-10 DIAGNOSIS — Z79899 Other long term (current) drug therapy: Secondary | ICD-10-CM | POA: Diagnosis not present

## 2020-08-10 DIAGNOSIS — R350 Frequency of micturition: Secondary | ICD-10-CM | POA: Diagnosis not present

## 2020-08-10 DIAGNOSIS — J439 Emphysema, unspecified: Secondary | ICD-10-CM | POA: Diagnosis present

## 2020-08-10 DIAGNOSIS — K429 Umbilical hernia without obstruction or gangrene: Secondary | ICD-10-CM | POA: Diagnosis present

## 2020-08-10 DIAGNOSIS — Z8719 Personal history of other diseases of the digestive system: Secondary | ICD-10-CM

## 2020-08-10 DIAGNOSIS — R32 Unspecified urinary incontinence: Secondary | ICD-10-CM | POA: Diagnosis not present

## 2020-08-10 DIAGNOSIS — M1610 Unilateral primary osteoarthritis, unspecified hip: Secondary | ICD-10-CM | POA: Diagnosis present

## 2020-08-10 DIAGNOSIS — Z833 Family history of diabetes mellitus: Secondary | ICD-10-CM | POA: Diagnosis not present

## 2020-08-10 DIAGNOSIS — I10 Essential (primary) hypertension: Secondary | ICD-10-CM | POA: Diagnosis present

## 2020-08-10 DIAGNOSIS — M5136 Other intervertebral disc degeneration, lumbar region: Secondary | ICD-10-CM | POA: Diagnosis present

## 2020-08-10 DIAGNOSIS — Z8249 Family history of ischemic heart disease and other diseases of the circulatory system: Secondary | ICD-10-CM

## 2020-08-10 DIAGNOSIS — Z86718 Personal history of other venous thrombosis and embolism: Secondary | ICD-10-CM

## 2020-08-10 DIAGNOSIS — Z8711 Personal history of peptic ulcer disease: Secondary | ICD-10-CM | POA: Diagnosis not present

## 2020-08-10 DIAGNOSIS — I700 Atherosclerosis of aorta: Secondary | ICD-10-CM | POA: Diagnosis present

## 2020-08-10 DIAGNOSIS — Z791 Long term (current) use of non-steroidal anti-inflammatories (NSAID): Secondary | ICD-10-CM

## 2020-08-10 DIAGNOSIS — R100 Acute abdomen: Secondary | ICD-10-CM | POA: Diagnosis not present

## 2020-08-10 DIAGNOSIS — Z7901 Long term (current) use of anticoagulants: Secondary | ICD-10-CM

## 2020-08-10 DIAGNOSIS — K219 Gastro-esophageal reflux disease without esophagitis: Secondary | ICD-10-CM | POA: Diagnosis present

## 2020-08-10 DIAGNOSIS — R109 Unspecified abdominal pain: Secondary | ICD-10-CM | POA: Diagnosis present

## 2020-08-10 DIAGNOSIS — Z20822 Contact with and (suspected) exposure to covid-19: Secondary | ICD-10-CM | POA: Diagnosis present

## 2020-08-10 DIAGNOSIS — M109 Gout, unspecified: Secondary | ICD-10-CM | POA: Diagnosis present

## 2020-08-10 DIAGNOSIS — Z7952 Long term (current) use of systemic steroids: Secondary | ICD-10-CM

## 2020-08-10 DIAGNOSIS — K222 Esophageal obstruction: Secondary | ICD-10-CM | POA: Diagnosis present

## 2020-08-10 DIAGNOSIS — K5720 Diverticulitis of large intestine with perforation and abscess without bleeding: Secondary | ICD-10-CM | POA: Diagnosis present

## 2020-08-10 DIAGNOSIS — M79671 Pain in right foot: Secondary | ICD-10-CM

## 2020-08-10 DIAGNOSIS — M79605 Pain in left leg: Secondary | ICD-10-CM | POA: Diagnosis not present

## 2020-08-10 DIAGNOSIS — K5732 Diverticulitis of large intestine without perforation or abscess without bleeding: Secondary | ICD-10-CM

## 2020-08-10 DIAGNOSIS — K449 Diaphragmatic hernia without obstruction or gangrene: Secondary | ICD-10-CM | POA: Diagnosis present

## 2020-08-10 DIAGNOSIS — G4733 Obstructive sleep apnea (adult) (pediatric): Secondary | ICD-10-CM | POA: Diagnosis present

## 2020-08-10 DIAGNOSIS — K5792 Diverticulitis of intestine, part unspecified, without perforation or abscess without bleeding: Secondary | ICD-10-CM

## 2020-08-10 DIAGNOSIS — K5790 Diverticulosis of intestine, part unspecified, without perforation or abscess without bleeding: Secondary | ICD-10-CM

## 2020-08-10 DIAGNOSIS — R160 Hepatomegaly, not elsewhere classified: Secondary | ICD-10-CM | POA: Diagnosis present

## 2020-08-10 DIAGNOSIS — Z87891 Personal history of nicotine dependence: Secondary | ICD-10-CM | POA: Diagnosis not present

## 2020-08-10 DIAGNOSIS — R609 Edema, unspecified: Secondary | ICD-10-CM | POA: Diagnosis not present

## 2020-08-10 DIAGNOSIS — M79604 Pain in right leg: Secondary | ICD-10-CM

## 2020-08-10 DIAGNOSIS — Z8546 Personal history of malignant neoplasm of prostate: Secondary | ICD-10-CM

## 2020-08-10 DIAGNOSIS — Z86010 Personal history of colonic polyps: Secondary | ICD-10-CM

## 2020-08-10 DIAGNOSIS — K760 Fatty (change of) liver, not elsewhere classified: Secondary | ICD-10-CM | POA: Diagnosis present

## 2020-08-10 HISTORY — DX: Activated protein C resistance: D68.51

## 2020-08-10 HISTORY — DX: Diverticulitis of intestine, part unspecified, without perforation or abscess without bleeding: K57.92

## 2020-08-10 LAB — COMPREHENSIVE METABOLIC PANEL
ALT: 25 U/L (ref 0–44)
AST: 27 U/L (ref 15–41)
Albumin: 3.3 g/dL — ABNORMAL LOW (ref 3.5–5.0)
Alkaline Phosphatase: 60 U/L (ref 38–126)
Anion gap: 13 (ref 5–15)
BUN: 9 mg/dL (ref 8–23)
CO2: 23 mmol/L (ref 22–32)
Calcium: 9 mg/dL (ref 8.9–10.3)
Chloride: 101 mmol/L (ref 98–111)
Creatinine, Ser: 0.81 mg/dL (ref 0.61–1.24)
GFR, Estimated: >60 mL/min (ref >60)
Glucose, Bld: 158 mg/dL — ABNORMAL HIGH (ref 70–99)
Potassium: 3.8 mmol/L (ref 3.5–5.1)
Sodium: 137 mmol/L (ref 135–145)
Total Bilirubin: 1.3 mg/dL — ABNORMAL HIGH (ref 0.3–1.2)
Total Protein: 7.1 g/dL (ref 6.5–8.1)

## 2020-08-10 LAB — URINALYSIS, ROUTINE W REFLEX MICROSCOPIC
Bacteria, UA: NONE SEEN
Bilirubin Urine: NEGATIVE
Glucose, UA: NEGATIVE mg/dL
Hgb urine dipstick: NEGATIVE
Ketones, ur: NEGATIVE mg/dL
Leukocytes,Ua: NEGATIVE
Nitrite: NEGATIVE
Protein, ur: 30 mg/dL — AB
Specific Gravity, Urine: 1.032 — ABNORMAL HIGH (ref 1.005–1.030)
pH: 5 (ref 5.0–8.0)

## 2020-08-10 LAB — I-STAT CHEM 8, ED
BUN: 10 mg/dL (ref 8–23)
Calcium, Ion: 1.12 mmol/L — ABNORMAL LOW (ref 1.15–1.40)
Chloride: 100 mmol/L (ref 98–111)
Creatinine, Ser: 0.6 mg/dL — ABNORMAL LOW (ref 0.61–1.24)
Glucose, Bld: 156 mg/dL — ABNORMAL HIGH (ref 70–99)
HCT: 45 % (ref 39.0–52.0)
Hemoglobin: 15.3 g/dL (ref 13.0–17.0)
Potassium: 3.9 mmol/L (ref 3.5–5.1)
Sodium: 138 mmol/L (ref 135–145)
TCO2: 24 mmol/L (ref 22–32)

## 2020-08-10 LAB — CBC
HCT: 42.3 % (ref 39.0–52.0)
Hemoglobin: 14.5 g/dL (ref 13.0–17.0)
MCH: 32.8 pg (ref 26.0–34.0)
MCHC: 34.3 g/dL (ref 30.0–36.0)
MCV: 95.7 fL (ref 80.0–100.0)
Platelets: 203 10*3/uL (ref 150–400)
RBC: 4.42 MIL/uL (ref 4.22–5.81)
RDW: 13.3 % (ref 11.5–15.5)
WBC: 15.9 10*3/uL — ABNORMAL HIGH (ref 4.0–10.5)
nRBC: 0 % (ref 0.0–0.2)

## 2020-08-10 LAB — LIPASE, BLOOD: Lipase: 15 U/L (ref 11–51)

## 2020-08-10 MED ORDER — ACETAMINOPHEN 325 MG PO TABS: 650.0000 mg | ORAL_TABLET | Freq: Four times a day (QID) | ORAL | Status: DC | PRN

## 2020-08-10 MED ORDER — LIDOCAINE 5 % EX PTCH
1.0000 | MEDICATED_PATCH | Freq: Every day | CUTANEOUS | Status: DC | PRN
  Filled 2020-08-10 (×2): qty 1

## 2020-08-10 MED ORDER — HYDROCODONE-ACETAMINOPHEN 5-325 MG PO TABS
1.0000 | ORAL_TABLET | Freq: Once | ORAL | Status: AC
  Administered 2020-08-10: 1 via ORAL

## 2020-08-10 MED ORDER — PIPERACILLIN-TAZOBACTAM 3.375 G IVPB 30 MIN
3.3750 g | Freq: Once | INTRAVENOUS | Status: AC
  Administered 2020-08-10: 3.375 g via INTRAVENOUS
  Filled 2020-08-10: qty 50

## 2020-08-10 MED ORDER — HYDROCODONE-ACETAMINOPHEN 5-325 MG PO TABS
ORAL_TABLET | ORAL | Status: AC
  Filled 2020-08-10: qty 1

## 2020-08-10 MED ORDER — FLUTICASONE-UMECLIDIN-VILANT 100-62.5-25 MCG/INH IN AEPB: 1.0000 | INHALATION_SPRAY | Freq: Every day | RESPIRATORY_TRACT | Status: DC

## 2020-08-10 MED ORDER — DIPHENHYDRAMINE HCL 12.5 MG/5ML PO ELIX: 12.5000 mg | ORAL_SOLUTION | Freq: Four times a day (QID) | ORAL | Status: DC | PRN

## 2020-08-10 MED ORDER — ONDANSETRON HCL 4 MG/2ML IJ SOLN
4.0000 mg | Freq: Once | INTRAMUSCULAR | Status: AC
  Administered 2020-08-10: 4 mg via INTRAVENOUS
  Filled 2020-08-10: qty 2

## 2020-08-10 MED ORDER — HYDROMORPHONE HCL 1 MG/ML IJ SOLN
1.0000 mg | Freq: Once | INTRAMUSCULAR | Status: AC
  Administered 2020-08-10: 1 mg via INTRAVENOUS
  Filled 2020-08-10: qty 1

## 2020-08-10 MED ORDER — POTASSIUM CHLORIDE IN NACL 20-0.9 MEQ/L-% IV SOLN
INTRAVENOUS | Status: DC
  Filled 2020-08-10 (×16): qty 1000

## 2020-08-10 MED ORDER — DIPHENHYDRAMINE HCL 50 MG/ML IJ SOLN: 12.5000 mg | Freq: Four times a day (QID) | INTRAMUSCULAR | Status: DC | PRN

## 2020-08-10 MED ORDER — MORPHINE SULFATE (PF) 4 MG/ML IV SOLN
4.0000 mg | INTRAVENOUS | Status: DC | PRN
  Administered 2020-08-10 – 2020-08-17 (×33): 4 mg via INTRAVENOUS
  Filled 2020-08-10 (×34): qty 1

## 2020-08-10 MED ORDER — MORPHINE SULFATE (PF) 4 MG/ML IV SOLN
4.0000 mg | Freq: Once | INTRAVENOUS | Status: AC
  Administered 2020-08-10: 4 mg via INTRAVENOUS
  Filled 2020-08-10: qty 1

## 2020-08-10 MED ORDER — ENOXAPARIN SODIUM 40 MG/0.4ML ~~LOC~~ SOLN
40.0000 mg | SUBCUTANEOUS | Status: DC
  Administered 2020-08-11 – 2020-08-22 (×11): 40 mg via SUBCUTANEOUS
  Filled 2020-08-10 (×11): qty 0.4

## 2020-08-10 MED ORDER — METHOCARBAMOL 500 MG PO TABS
500.0000 mg | ORAL_TABLET | Freq: Four times a day (QID) | ORAL | Status: DC | PRN
  Administered 2020-08-11 – 2020-08-16 (×11): 500 mg via ORAL
  Filled 2020-08-10 (×12): qty 1

## 2020-08-10 MED ORDER — ONDANSETRON 4 MG PO TBDP: 4.0000 mg | ORAL_TABLET | Freq: Four times a day (QID) | ORAL | Status: DC | PRN

## 2020-08-10 MED ORDER — UMECLIDINIUM BROMIDE 62.5 MCG/INH IN AEPB
1.0000 | INHALATION_SPRAY | Freq: Every day | RESPIRATORY_TRACT | Status: DC
  Administered 2020-08-11 – 2020-08-13 (×2): 1 via RESPIRATORY_TRACT
  Filled 2020-08-10 (×2): qty 7

## 2020-08-10 MED ORDER — ONDANSETRON HCL 4 MG/2ML IJ SOLN
4.0000 mg | Freq: Once | INTRAMUSCULAR | Status: AC
  Administered 2020-08-10: 4 mg via INTRAMUSCULAR

## 2020-08-10 MED ORDER — ACETAMINOPHEN 650 MG RE SUPP: 650.0000 mg | Freq: Four times a day (QID) | RECTAL | Status: DC | PRN

## 2020-08-10 MED ORDER — METOPROLOL TARTRATE 5 MG/5ML IV SOLN: 5.0000 mg | Freq: Four times a day (QID) | INTRAVENOUS | Status: DC | PRN

## 2020-08-10 MED ORDER — FLUTICASONE FUROATE-VILANTEROL 100-25 MCG/INH IN AEPB
1.0000 | INHALATION_SPRAY | Freq: Every day | RESPIRATORY_TRACT | Status: DC
  Administered 2020-08-11 – 2020-08-13 (×2): 1 via RESPIRATORY_TRACT
  Filled 2020-08-10 (×2): qty 28

## 2020-08-10 MED ORDER — PIPERACILLIN-TAZOBACTAM 3.375 G IVPB
3.3750 g | Freq: Three times a day (TID) | INTRAVENOUS | Status: DC
  Administered 2020-08-11 – 2020-08-17 (×20): 3.375 g via INTRAVENOUS
  Filled 2020-08-10 (×20): qty 50

## 2020-08-10 MED ORDER — ZOLPIDEM TARTRATE 5 MG PO TABS
5.0000 mg | ORAL_TABLET | Freq: Every evening | ORAL | Status: DC | PRN
Start: 2020-08-10 — End: 2020-08-23

## 2020-08-10 MED ORDER — ONDANSETRON HCL 4 MG/2ML IJ SOLN
INTRAMUSCULAR | Status: AC
  Filled 2020-08-10: qty 2

## 2020-08-10 MED ORDER — ONDANSETRON HCL 4 MG/2ML IJ SOLN
4.0000 mg | Freq: Four times a day (QID) | INTRAMUSCULAR | Status: DC | PRN
  Administered 2020-08-11 – 2020-08-17 (×2): 4 mg via INTRAVENOUS
  Filled 2020-08-10 (×2): qty 2

## 2020-08-10 MED ORDER — PANTOPRAZOLE SODIUM 40 MG IV SOLR
40.0000 mg | Freq: Every day | INTRAVENOUS | Status: DC
  Administered 2020-08-10 – 2020-08-19 (×10): 40 mg via INTRAVENOUS
  Filled 2020-08-10 (×10): qty 40

## 2020-08-10 MED ORDER — OXYCODONE HCL 5 MG PO TABS
5.0000 mg | ORAL_TABLET | ORAL | Status: DC | PRN
  Administered 2020-08-11 (×2): 5 mg via ORAL
  Filled 2020-08-10 (×2): qty 1

## 2020-08-10 NOTE — ED Provider Notes (Signed)
[place]   MRN: [id number] DOB: 1956/06/27  Subjective:   [name] is a 66 y.o. male presenting for right lower abdominal pain.  Patient states that he believes it was from his hip replacement but this was done in 2020.  He does have a history of diverticulosis.  He feels he has had decreased appetite, nausea without vomiting.  Has not responded to any pain medications.  Denies constipation, bloody stools.  No current facility-administered medications for this encounter.  Current Outpatient Medications:  .  aspirin EC 81 MG tablet, Take 81 mg by mouth daily., Disp: , Rfl:  .  Fluticasone-Umeclidin-Vilant (TRELEGY ELLIPTA) 100-62.5-25 MCG/INH AEPB, Inhale 1 puff into the lungs daily., Disp: 60 each, Rfl: 11 .  predniSONE (STERAPRED UNI-PAK 21 TAB) 10 MG (21) TBPK tablet, Take by mouth daily. Take 6 tabs by mouth daily  for 2 days, then 5 tabs for 2 days, then 4 tabs for 2 days, then 3 tabs for 2 days, 2 tabs for 2 days, then 1 tab by mouth daily for 2 days, Disp: 42 tablet, Rfl: 0 .  tizanidine (ZANAFLEX) 2 MG capsule, Take 1 capsule (2 mg total) by mouth 3 (three) times daily., Disp: 30 capsule, Rfl: 0   No Known Allergies  Past Medical History:  Diagnosis Date  . Bilateral shoulder bursitis   . Chronic back pain   . COPD (chronic obstructive pulmonary disease) with emphysema ([place])   . DDD (degenerative disc disease), lumbar    hx epideral injection  L5--S1   . DVT (deep venous thrombosis) ([place]) 11/19/2018  . Dysphasia    intermittant w/ food   . Dyspnea   . Dyspnea on exertion   . GERD (gastroesophageal reflux disease)   . Gout    L great toe  . Hiatal hernia   . Hip problem 2020   left, seeing orthopedics  . History of acute pancreatitis    alcoholic pancreatitis [id number] and 01-30-2012  . History of colon polyps    10-17-2005  hyperplastic polyp's  . History of esophageal stricture    s/p  dilation 02-02-2017  . History of peptic ulcer  1990s  . History of pneumococcal pneumonia 2006   bilateral pneumonia w/ left lower lobe abscess treated w/ chest tube with suction for drainage  . History of pneumothorax    1984--  left spontaneous pneumothorax ,  treated w/ chest tube  . Hypertension   . Malignant neoplasm of prostate [facility]) urologist-  dr dahlstedt/  oncologist -- dr Tammi Klippel   dx 12-02-2016-- Stage T2b, Gleason 3+4,  PSA 5.99,  vol 25cc  . Numbness of left foot    outer left ankle numb-- per pt has appt. w/ neurologist  . OSA (obstructive sleep apnea)    per pt has not used cpap over year ago from 04-16-2017--- per study 01-17-2010  severe osa  . Solid nodule of lung greater than 8 mm in diameter 05/11/2018   03/2018: RUL Nodule, 46mm  . Wears glasses      Past Surgical History:  Procedure Laterality Date  . BIOPSY  02/02/2017   Procedure: BIOPSY;  Surgeon: Daneil Dolin, MD;  [facility];  Service: Endoscopy;;  duodenal bx's, esophgeal bx's  . BIOPSY  07/30/2017   Procedure: BIOPSY;  Surgeon: Daneil Dolin, MD;  [facility];  Service: Endoscopy;;  esophagus  . CARDIOVASCULAR STRESS TEST  09/12/2009   normal nuclear perfusion study w/ no  ischemia /  normal LV function and wall motion , ef 57%  . COLONOSCOPY  2007   Dr. Gala Romney: hyperplastic polyps, internal hemorrhoids   . COLONOSCOPY WITH PROPOFOL N/A 05/03/2018   Dr. Gala Romney: diverticulosis in entire colon, multiple polyps in sigmoid, at splenic flexure, and ascending colon, not all polyps removed. Tubular adenomas and inflammatory polyps. Needs 3 year surveilance  . EPIDURAL BLOCK INJECTION  2020  . ESOPHAGOGASTRODUODENOSCOPY (EGD) WITH PROPOFOL N/A 02/02/2017   Dr. Gala Romney: esophageal stenosis s/p dilation and biopsy, medium sized hiatal hernia, normal duodenum  . ESOPHAGOGASTRODUODENOSCOPY (EGD) WITH PROPOFOL N/A 07/30/2017   Dr. Gala Romney: esophageal stenosis s/p dilation and biopsy, medium-sized hiatal hernia, normal duodenum  . HEMORRHOID  SURGERY  01-21-2008    [facility]  . MALONEY DILATION N/A 02/02/2017   Procedure: Venia Minks DILATION;  Surgeon: Daneil Dolin, MD;  [facility];  Service: Endoscopy;  Laterality: N/A;  . Venia Minks DILATION N/A 07/30/2017   Procedure: Venia Minks DILATION;  Surgeon: Daneil Dolin, MD;  [facility];  Service: Endoscopy;  Laterality: N/A;  . POLYPECTOMY  05/03/2018   Procedure: POLYPECTOMY;  Surgeon: Daneil Dolin, MD;  [facility];  Service: Endoscopy;;  ascending colon polyp, sigmoid colon polyps (multiple)  . RADIOACTIVE SEED IMPLANT N/A 04/23/2017   Procedure: RADIOACTIVE SEED IMPLANT/BRACHYTHERAPY IMPLANT;  Surgeon: Franchot Gallo, MD;  Location: [facility];  Service: Urology;  Laterality: N/A;  . SHOULDER ARTHROSCOPY  08/05/2003  . SPACE OAR INSTILLATION N/A 04/23/2017   Procedure: SPACE OAR INSTILLATION;  Surgeon: Franchot Gallo, MD;  Location: [facility];  Service: Urology;  Laterality: N/A;  . TRANSTHORACIC ECHOCARDIOGRAM  03/28/2009   mild LVH, ef 55-60%/ mild aorta calcification  . VASECTOMY  08/05/1983  . VIDEO ASSISTED THORACOSCOPY (VATS)/DECORTICATION Left     Family History  Problem Relation Age of Onset  . Diabetes Mother   . Heart disease Father   . Heart disease Brother   . Cancer Neg Hx   . Colon cancer Neg Hx   . Colon polyps Neg Hx   . Neuropathy Neg Hx     Social History   Tobacco Use  . Smoking status: Former Smoker    Packs/day: 1.00    Years: 30.00    Pack years: 30.00    Types: Cigarettes, E-cigarettes    Quit date: 10/03/2015    Years since quitting: 4.8  . Smokeless tobacco: Never Used  . Tobacco comment: stoped vaping 2018  Vaping Use  . Vaping Use: Former  Substance Use Topics  . Alcohol use: Yes    Alcohol/week: 21.0 standard drinks    Types: 21 Shots of liquor per week    Comment: 2-3 drinks daily (Wine); 01/04/19 pt states he hasn't had anything to drink in 2-3 months  . Drug  use: No    ROS   Objective:   Vitals: BP (!) 153/83   Pulse (!) 102   Temp 98.2 F (36.8 C)   Resp 19   SpO2 94%   Physical Exam Constitutional:      General: He is not in acute distress.    Appearance: Normal appearance. He is well-developed and normal weight. He is not ill-appearing, toxic-appearing or diaphoretic.     Comments: Patient actively vomiting from pain during office visit.  HENT:     Head: Normocephalic and atraumatic.     Right Ear: External ear normal.     Left Ear: External ear normal.  Nose: Nose normal.     Mouth/Throat:     Pharynx: Oropharynx is clear.  Eyes:     General: No scleral icterus.       Right eye: No discharge.        Left eye: No discharge.     Extraocular Movements: Extraocular movements intact.     Pupils: Pupils are equal, round, and reactive to light.  Cardiovascular:     Rate and Rhythm: Normal rate.  Pulmonary:     Effort: Pulmonary effort is normal.  Abdominal:     General: There is distension.     Palpations: Abdomen is soft. There is no mass.     Tenderness: There is abdominal tenderness (RLQ). There is guarding. There is no right CVA tenderness, left CVA tenderness or rebound.  Musculoskeletal:     Cervical back: Normal range of motion.  Neurological:     Mental Status: He is alert and oriented to person, place, and time.  Psychiatric:        Mood and Affect: Mood normal.        Behavior: Behavior normal.        Thought Content: Thought content normal.        Judgment: Judgment normal.    IM Zofran, p.o. hydrocodone.  Assessment and Plan :   PDMP not reviewed this encounter.  1. Acute abdomen   2. Diverticulosis     High suspicion for acute abdomen, diverticulitis given his history of diverticulosis and physical exam findings.  Discussed this with patient and recommended his wife drive him to the emergency room now for CT scan abdomen pelvis.    Jaynee Eagles, PA-C 08/10/20 2002

## 2020-08-10 NOTE — H&P (Signed)
[name] is an 65 y.o. male.   Chief Complaint: lower abdominal pain HPI: [name] with a history of COPD developed shooting lower abdominal pains this afternoon. The pains persisted and he came to the ED. W/U shows leukocytosis of 15,900. CT A/P shows sigmoid diverticulitis with microperforation in the RLQ. He has a known HX of diverticulosis with last colonoscopy 3 years ago. He has never had diverticulitis. I was asked to see him for surgical management.  Past Medical History:  Diagnosis Date  . Bilateral shoulder bursitis   . Chronic back pain   . COPD (chronic obstructive pulmonary disease) with emphysema ([place])   . DDD (degenerative disc disease), lumbar    hx epideral injection  L5--S1   . DVT (deep venous thrombosis) ([name]) 11/19/2018  . Dysphasia    intermittant w/ food   . Dyspnea   . Dyspnea on exertion   . GERD (gastroesophageal reflux disease)   . Gout    L great toe  . Hiatal hernia   . Hip problem 2020   left, seeing orthopedics  . History of acute pancreatitis    alcoholic pancreatitis [id number] and 01-30-2012  . History of colon polyps    10-17-2005  hyperplastic polyp's  . History of esophageal stricture    s/p  dilation 02-02-2017  . History of peptic ulcer 1990s  . History of pneumococcal pneumonia 2006   bilateral pneumonia w/ left lower lobe abscess treated w/ chest tube with suction for drainage  . History of pneumothorax    1984--  left spontaneous pneumothorax ,  treated w/ chest tube  . Hypertension   . Malignant neoplasm of prostate [facility]) urologist-  dr dahlstedt/  oncologist -- dr Tammi Klippel   dx 12-02-2016-- Stage T2b, Gleason 3+4,  PSA 5.99,  vol 25cc  . Numbness of left foot    outer left ankle numb-- per pt has appt. w/ neurologist  . OSA (obstructive sleep apnea)    per pt has not used cpap over year ago from 04-16-2017--- per study 01-17-2010  severe osa  . Solid nodule of lung greater than 8 mm in diameter 05/11/2018   03/2018: RUL Nodule,  30mm  . Wears glasses     Past Surgical History:  Procedure Laterality Date  . BIOPSY  02/02/2017   Procedure: BIOPSY;  Surgeon: Daneil Dolin, MD;  [facility];  Service: Endoscopy;;  duodenal bx's, esophgeal bx's  . BIOPSY  07/30/2017   Procedure: BIOPSY;  Surgeon: Daneil Dolin, MD;  [facility];  Service: Endoscopy;;  esophagus  . CARDIOVASCULAR STRESS TEST  09/12/2009   normal nuclear perfusion study w/ no ischemia /  normal LV function and wall motion , ef 57%  . COLONOSCOPY  2007   Dr. Gala Romney: hyperplastic polyps, internal hemorrhoids   . COLONOSCOPY WITH PROPOFOL N/A 05/03/2018   Dr. Gala Romney: diverticulosis in entire colon, multiple polyps in sigmoid, at splenic flexure, and ascending colon, not all polyps removed. Tubular adenomas and inflammatory polyps. Needs 3 year surveilance  . EPIDURAL BLOCK INJECTION  2020  . ESOPHAGOGASTRODUODENOSCOPY (EGD) WITH PROPOFOL N/A 02/02/2017   Dr. Gala Romney: esophageal stenosis s/p dilation and biopsy, medium sized hiatal hernia, normal duodenum  . ESOPHAGOGASTRODUODENOSCOPY (EGD) WITH PROPOFOL N/A 07/30/2017   Dr. Gala Romney: esophageal stenosis s/p dilation and biopsy, medium-sized hiatal hernia, normal duodenum  . HEMORRHOID SURGERY  01-21-2008    [facility]  . MALONEY DILATION N/A 02/02/2017   Procedure: MALONEY DILATION;  Surgeon:  Rourk, [name], MD;  [facility];  Service: Endoscopy;  Laterality: N/A;  . Venia Minks DILATION N/A 07/30/2017   Procedure: Venia Minks DILATION;  Surgeon: Daneil Dolin, MD;  [facility];  Service: Endoscopy;  Laterality: N/A;  . POLYPECTOMY  05/03/2018   Procedure: POLYPECTOMY;  Surgeon: Daneil Dolin, MD;  [facility];  Service: Endoscopy;;  ascending colon polyp, sigmoid colon polyps (multiple)  . RADIOACTIVE SEED IMPLANT N/A 04/23/2017   Procedure: RADIOACTIVE SEED IMPLANT/BRACHYTHERAPY IMPLANT;  Surgeon: Franchot Gallo, MD;  Location: [facility];   Service: Urology;  Laterality: N/A;  . SHOULDER ARTHROSCOPY  08/05/2003  . SPACE OAR INSTILLATION N/A 04/23/2017   Procedure: SPACE OAR INSTILLATION;  Surgeon: Franchot Gallo, MD;  Location: [facility];  Service: Urology;  Laterality: N/A;  . TRANSTHORACIC ECHOCARDIOGRAM  03/28/2009   mild LVH, ef 55-60%/ mild aorta calcification  . VASECTOMY  08/05/1983  . VIDEO ASSISTED THORACOSCOPY (VATS)/DECORTICATION Left     Family History  Problem Relation Age of Onset  . Diabetes Mother   . Heart disease Father   . Heart disease Brother   . Cancer Neg Hx   . Colon cancer Neg Hx   . Colon polyps Neg Hx   . Neuropathy Neg Hx    Social History:  reports that he quit smoking about 4 years ago. His smoking use included cigarettes and e-cigarettes. He has a 30.00 pack-year smoking history. He has never used smokeless tobacco. He reports current alcohol use of about 21.0 standard drinks of alcohol per week. He reports that he does not use drugs.  Allergies: No Known Allergies  (Not in a hospital admission)   Results for orders placed or performed during the hospital encounter of 08/10/20 (from the past 48 hour(s))  Urinalysis, Routine w reflex microscopic Urine, Clean Catch     Status: Abnormal   Collection Time: 08/10/20  8:13 PM  Result Value Ref Range   Color, Urine AMBER (A) YELLOW    Comment: BIOCHEMICALS MAY BE AFFECTED BY COLOR   APPearance HAZY (A) CLEAR   Specific Gravity, Urine 1.032 (H) 1.005 - 1.030   pH 5.0 5.0 - 8.0   Glucose, UA NEGATIVE NEGATIVE mg/dL   Hgb urine dipstick NEGATIVE NEGATIVE   Bilirubin Urine NEGATIVE NEGATIVE   Ketones, ur NEGATIVE NEGATIVE mg/dL   Protein, ur 30 (A) NEGATIVE mg/dL   Nitrite NEGATIVE NEGATIVE   Leukocytes,Ua NEGATIVE NEGATIVE   RBC / HPF 0-5 0 - 5 RBC/hpf   WBC, UA 0-5 0 - 5 WBC/hpf   Bacteria, UA NONE SEEN NONE SEEN   Squamous Epithelial / LPF 0-5 0 - 5   Mucus PRESENT     Comment: Performed at [facility], [address] [place], [place] [address]  Lipase, blood     Status: None   Collection Time: 08/10/20  8:20 PM  Result Value Ref Range   Lipase 15 11 - 51 U/L    Comment: Performed at [facility] [address] [place], [place] [address]  Comprehensive metabolic panel     Status: Abnormal   Collection Time: 08/10/20  8:20 PM  Result Value Ref Range   Sodium 137 135 - 145 mmol/L   Potassium 3.8 3.5 - 5.1 mmol/L   Chloride 101 98 - 111 mmol/L   CO2 23 22 - 32 mmol/L   Glucose, Bld 158 (H) 70 - 99 mg/dL    Comment: Glucose  reference range applies only to samples taken after fasting for at least 8 hours.   BUN 9 8 - 23 mg/dL   Creatinine, Ser 0.81 0.61 - 1.24 mg/dL   Calcium 9.0 8.9 - 10.3 mg/dL   Total Protein 7.1 6.5 - 8.1 g/dL   Albumin 3.3 (L) 3.5 - 5.0 g/dL   AST 27 15 - 41 U/L   ALT 25 0 - 44 U/L   Alkaline Phosphatase 60 38 - 126 U/L   Total Bilirubin 1.3 (H) 0.3 - 1.2 mg/dL   GFR, Estimated >60 >60 mL/min    Comment: (NOTE) Calculated using the CKD-EPI Creatinine Equation (2021)    Anion gap 13 5 - 15    Comment: Performed at [facility] [address] [place], [place] [address]  CBC     Status: Abnormal   Collection Time: 08/10/20  8:20 PM  Result Value Ref Range   WBC 15.9 (H) 4.0 - 10.5 K/uL   RBC 4.42 4.22 - 5.81 MIL/uL   Hemoglobin 14.5 13.0 - 17.0 g/dL   HCT 42.3 39.0 - 52.0 %   MCV 95.7 80.0 - 100.0 fL   MCH 32.8 26.0 - 34.0 pg   MCHC 34.3 30.0 - 36.0 g/dL   RDW 13.3 11.5 - 15.5 %   Platelets 203 150 - 400 K/uL   nRBC 0.0 0.0 - 0.2 %    Comment: Performed at [facility], [place] [address] [place], [place] [address]  I-stat chem 8, ED (not at [facility] or [facility])     Status: Abnormal   Collection Time: 08/10/20  8:31 PM  Result Value Ref Range   Sodium 138 135 - 145 mmol/L   Potassium 3.9 3.5 - 5.1 mmol/L   Chloride 100 98 - 111 mmol/L   BUN 10 8 - 23 mg/dL   Creatinine, Ser 0.60 (L) 0.61 - 1.24 mg/dL   Glucose, Bld 156 (H) 70 - 99 mg/dL     Comment: Glucose reference range applies only to samples taken after fasting for at least 8 hours.   Calcium, Ion 1.12 (L) 1.15 - 1.40 mmol/L   TCO2 24 22 - 32 mmol/L   Hemoglobin 15.3 13.0 - 17.0 g/dL   HCT 45.0 39.0 - 52.0 %   CT ABDOMEN PELVIS WO CONTRAST  Result Date: 08/10/2020 CLINICAL DATA:  Left lower quadrant pain EXAM: CT ABDOMEN AND PELVIS WITHOUT CONTRAST TECHNIQUE: Multidetector CT imaging of the abdomen and pelvis was performed following the standard protocol without IV contrast. COMPARISON:  10/10/2019 FINDINGS: Lower chest: No acute abnormality. Hepatobiliary: Diffuse steatosis. Punctate calcified granuloma. Gallbladder is unremarkable. No biliary dilatation. Pancreas: Unremarkable. Spleen: Unremarkable. Adrenals/Urinary Tract: The adrenals are unremarkable. Small bilateral renal cysts are again identified. Bladder is partially obscured by artifact but otherwise unremarkable. Stomach/Bowel: Small hiatal hernia. Distal colonic diverticulosis. There is infiltration of the adjacent fat in the pelvis on the right with minimal ill-defined fluid. There are a few small foci of likely extraluminal air in this region (series 6, images 73 and 76). Minimal pneumoperitoneum is seen along the right hemidiaphragm. There is no evidence of abscess formation. Vascular/Lymphatic: Aortic atherosclerosis. No enlarged lymph nodes. Small nonenlarged retroperitoneal nodes are noted. Reproductive: Obscured by artifact.  Prostate seeds are present. Other: Unchanged umbilical hernia with protrusion of a small segment of nonobstructed small bowel. Musculoskeletal: No acute osseous abnormality. IMPRESSION: Acute sigmoid diverticulitis with small perforation. No evidence of abscess. Hepatic steatosis. Small hiatal hernia. These results were called  by telephone at the time of interpretation on 08/10/2020 at 8:48 pm to provider ADAM CURATOLO , who verbally acknowledged these results. Electronically Signed   By: Macy Mis [name].D.   On: 08/10/2020 20:51    Review of Systems  Constitutional: Negative.   HENT: Negative.   Eyes: Negative.   Respiratory: Negative for chest tightness and shortness of breath.   Cardiovascular: Negative for chest pain.  Gastrointestinal: Positive for abdominal pain. Negative for blood in stool, diarrhea, nausea and vomiting.  Endocrine: Negative.   Genitourinary: Negative.   Musculoskeletal: Negative.   Allergic/Immunologic: Negative.   Neurological: Negative.   Hematological: Negative.   Psychiatric/Behavioral: Negative.     Blood pressure (!) 153/91, pulse 92, temperature 99.2 F (37.3 C), temperature source Oral, resp. rate (!) 31, height 6\' 4"  (1.93 [name]), weight 104.3 kg, SpO2 94 %. Physical Exam Constitutional:      Appearance: Normal appearance.  HENT:     Head: Normocephalic.     Right Ear: External ear normal.     Left Ear: External ear normal.     Nose: Nose normal.     Mouth/Throat:     Mouth: Mucous membranes are moist.  Eyes:     General: No scleral icterus.    Pupils: Pupils are equal, round, and reactive to light.  Cardiovascular:     Rate and Rhythm: Normal rate and regular rhythm.     Pulses: Normal pulses.     Heart sounds: Normal heart sounds.  Pulmonary:     Effort: Pulmonary effort is normal. No respiratory distress.     Breath sounds: No wheezing, rhonchi or rales.  Abdominal:     General: Abdomen is flat. There is no distension.     Tenderness: There is abdominal tenderness. There is no guarding or rebound.     Comments: Moderately tender RLQ and somewhat in the LLQ without peritonitis  Musculoskeletal:        General: Normal range of motion.     Cervical back: Normal range of motion and neck supple.  Skin:    General: Skin is warm.     Capillary Refill: Capillary refill takes 2 to 3 seconds.  Neurological:     Mental Status: He is alert and oriented to person, place, and time.  Psychiatric:        Mood and Affect: Mood normal.       Assessment/Plan Sigmoid diverticulitis with microperforation RLQ, no abscess - admit, bowel rest, IV Zosyn. Will follow closely. If significantly worsens, may need partial colectomy and colostomy. I think it is likely this can be treated medically and I discussed this with him.  COPD - home Trelegy  COVID test pending  Admit to med surg, inpatient  Zenovia Jarred, MD 08/10/2020, 10:09 PM

## 2020-08-10 NOTE — ED Triage Notes (Signed)
Pt here from UC for continued eval of severe lower right quadrant abd pain that causes him to "doubles over." States UC sent him over for an abd CT scan for suspected diverticulitis

## 2020-08-10 NOTE — ED Notes (Signed)
Patient is being discharged from the Urgent Care and sent to the Emergency Department via pov. Per Jaynee Eagles, PA, patient is in need of higher level of care due to severe abdominal pain, N/V. Patient is aware and verbalizes understanding of plan of care.  Vitals:   08/10/20 1926  BP: (!) 153/83  Pulse: (!) 102  Resp: 19  Temp: 98.2 F (36.8 C)  SpO2: 94%

## 2020-08-10 NOTE — ED Triage Notes (Signed)
Pt presents with complaints of hip pain x 1 week. Reports he has an artificial hip. Reports he has been having pain in his left hip x 2 days. Reports today he started having stabbing pain in his left hip that shot across his lower abdomen. Pain is worse with movement and is tender to touch.

## 2020-08-10 NOTE — ED Provider Notes (Signed)
[facility] EMERGENCY DEPARTMENT Provider Note   CSN: [id number] Arrival date & time: 08/10/20  2004     History Chief Complaint  Patient presents with  . Diverticulosis    [name] is a 65 y.o. male testing history of COPD, DDD, GERD who presents for evaluation of abdominal pain, nausea/vomiting that began today.  He reports that about 1:30 PM, he started having some abdominal pain.  He reports that over the next hour or so, the pain got significantly worse.  He states it is worse in his left lower but he really feels like it radiates into the right side as well.  He has had nausea/vomiting since onset of symptoms.  He states he had felt some pain in his lower abdomen/hip area but he thought it was due to his hip arthritis.  He states otherwise he was in his normal state of health.  He has not noted any fevers, diarrhea.  He states he has a history of diverticulosis but has never had diverticulitis.  He went to urgent care initially for symptoms and was prompted to go to the emergency department for further evaluation.  He has been vaccinated for COVID.  No known COVID exposure.  The history is provided by the patient.       Past Medical History:  Diagnosis Date  . Bilateral shoulder bursitis   . Chronic back pain   . COPD (chronic obstructive pulmonary disease) with emphysema ([place])   . DDD (degenerative disc disease), lumbar    hx epideral injection  L5--S1   . DVT (deep venous thrombosis) ([place]) 11/19/2018  . Dysphasia    intermittant w/ food   . Dyspnea   . Dyspnea on exertion   . GERD (gastroesophageal reflux disease)   . Gout    L great toe  . Hiatal hernia   . Hip problem 2020   left, seeing orthopedics  . History of acute pancreatitis    alcoholic pancreatitis 99991111 and 01-30-2012  . History of colon polyps    10-17-2005  hyperplastic polyp's  . History of esophageal stricture    s/p  dilation 02-02-2017  . History of peptic ulcer  1990s  . History of pneumococcal pneumonia 2006   bilateral pneumonia w/ left lower lobe abscess treated w/ chest tube with suction for drainage  . History of pneumothorax    1984--  left spontaneous pneumothorax ,  treated w/ chest tube  . Hypertension   . Malignant neoplasm of prostate [facility]) urologist-  dr dahlstedt/  oncologist -- dr Tammi Klippel   dx 12-02-2016-- Stage T2b, Gleason 3+4,  PSA 5.99,  vol 25cc  . Numbness of left foot    outer left ankle numb-- per pt has appt. w/ neurologist  . OSA (obstructive sleep apnea)    per pt has not used cpap over year ago from 04-16-2017--- per study 01-17-2010  severe osa  . Solid nodule of lung greater than 8 mm in diameter 05/11/2018   03/2018: RUL Nodule, 71mm  . Wears glasses     Patient Active Problem List   Diagnosis Date Noted  . Diverticulitis large intestine 08/10/2020  . Alcohol abuse 10/12/2019  . Acute alcoholic pancreatitis [id number]  . Avascular necrosis of femur ([place]) 01/05/2019  . Lumbar radiculopathy 01/05/2019  . History of colonic polyps 08/19/2018  . Esophageal stenosis 08/19/2018  . DVT (deep venous thrombosis) ([place]) 05/11/2018  . Solid nodule of lung greater than 8 mm in diameter 05/11/2018  .  Abnormal CT scan, colon 04/13/2018  . Obstructive sleep apnea treated with continuous positive airway pressure (CPAP) 09/29/2017  . GERD (gastroesophageal reflux disease) 04/07/2017  . Dysphagia 01/29/2017  . Malignant neoplasm of prostate ([place]) 01/19/2017  . Pancreatitis, alcoholic 16/05/9603  . Alcoholism ([place]) 11/11/2011  . Tobacco abuse 11/11/2011  . Thrombocytopenia ([place]) 11/11/2011  . HYPERSOMNIA, ASSOCIATED WITH SLEEP APNEA 09/27/2009  . COPD (chronic obstructive pulmonary disease) ([place]) 09/07/2009  . Pneumothorax 09/07/2009  . BURSITIS 09/07/2009  . FATIGUE / MALAISE 09/07/2009  . SHORTNESS OF BREATH 09/07/2009  . PNEUMOTHORAX 09/07/2009    Past Surgical History:  Procedure Laterality Date  . BIOPSY  02/02/2017    Procedure: BIOPSY;  Surgeon: Daneil Dolin, MD;  Location: [facility];  Service: Endoscopy;;  duodenal bx's, esophgeal bx's  . BIOPSY  07/30/2017   Procedure: BIOPSY;  Surgeon: Daneil Dolin, MD;  Location: [facility];  Service: Endoscopy;;  esophagus  . CARDIOVASCULAR STRESS TEST  09/12/2009   normal nuclear perfusion study w/ no ischemia /  normal LV function and wall motion , ef 57%  . COLONOSCOPY  2007   Dr. Gala Romney: hyperplastic polyps, internal hemorrhoids   . COLONOSCOPY WITH PROPOFOL N/A 05/03/2018   Dr. Gala Romney: diverticulosis in entire colon, multiple polyps in sigmoid, at splenic flexure, and ascending colon, not all polyps removed. Tubular adenomas and inflammatory polyps. Needs 3 year surveilance  . EPIDURAL BLOCK INJECTION  2020  . ESOPHAGOGASTRODUODENOSCOPY (EGD) WITH PROPOFOL N/A 02/02/2017   Dr. Gala Romney: esophageal stenosis s/p dilation and biopsy, medium sized hiatal hernia, normal duodenum  . ESOPHAGOGASTRODUODENOSCOPY (EGD) WITH PROPOFOL N/A 07/30/2017   Dr. Gala Romney: esophageal stenosis s/p dilation and biopsy, medium-sized hiatal hernia, normal duodenum  . HEMORRHOID SURGERY  01-21-2008    [facility]  . MALONEY DILATION N/A 02/02/2017   Procedure: Venia Minks DILATION;  Surgeon: Daneil Dolin, MD;  Location[facility];  Service: Endoscopy;  Laterality: N/A;  . Venia Minks DILATION N/A 07/30/2017   Procedure: Venia Minks DILATION;  Surgeon: Daneil Dolin, MD;  [facility];  Service: Endoscopy;  Laterality: N/A;  . POLYPECTOMY  05/03/2018   Procedure: POLYPECTOMY;  Surgeon: Daneil Dolin, MD;  Location: [facility];  Service: Endoscopy;;  ascending colon polyp, sigmoid colon polyps (multiple)  . RADIOACTIVE SEED IMPLANT N/A 04/23/2017   Procedure: RADIOACTIVE SEED IMPLANT/BRACHYTHERAPY IMPLANT;  Surgeon: Franchot Gallo, MD;  Location[facility];  Service: Urology;  Laterality: N/A;  . SHOULDER ARTHROSCOPY  08/05/2003  . SPACE OAR INSTILLATION  N/A 04/23/2017   Procedure: SPACE OAR INSTILLATION;  Surgeon: Franchot Gallo, MD;  Location: [facility];  Service: Urology;  Laterality: N/A;  . TRANSTHORACIC ECHOCARDIOGRAM  03/28/2009   mild LVH, ef 55-60%/ mild aorta calcification  . VASECTOMY  08/05/1983  . VIDEO ASSISTED THORACOSCOPY (VATS)/DECORTICATION Left        Family History  Problem Relation Age of Onset  . Diabetes Mother   . Heart disease Father   . Heart disease Brother   . Cancer Neg Hx   . Colon cancer Neg Hx   . Colon polyps Neg Hx   . Neuropathy Neg Hx     Social History   Tobacco Use  . Smoking status: Former Smoker    Packs/day: 1.00    Years: 30.00    Pack years: 30.00    Types: Cigarettes, E-cigarettes    Quit date: 10/03/2015    Years since quitting: 4.8  . Smokeless tobacco:  Never Used  . Tobacco comment: stoped vaping 2018  Vaping Use  . Vaping Use: Former  Substance Use Topics  . Alcohol use: Yes    Alcohol/week: 21.0 standard drinks    Types: 21 Shots of liquor per week    Comment: 2-3 drinks daily (Wine); 01/04/19 pt states he hasn't had anything to drink in 2-3 months  . Drug use: No    Home Medications Prior to Admission medications   Medication Sig Start Date End Date Taking? Authorizing Provider  Fluticasone-Umeclidin-Vilant (TRELEGY ELLIPTA) 100-62.5-25 MCG/INH AEPB Inhale 1 puff into the lungs daily. 08/16/19  Yes Icard, Bradley L, DO  Ibuprofen 200 MG CAPS Take 1 capsule by mouth daily as needed (For pain).   Yes [provider]  lidocaine (LIDODERM) 5 % Place 1 patch onto the skin daily as needed (For back pain). Remove & Discard patch within 12 hours or as directed by MD   Yes [provider]  predniSONE (STERAPRED UNI-PAK 21 TAB) 10 MG (21) TBPK tablet Take by mouth daily. Take 6 tabs by mouth daily  for 2 days, then 5 tabs for 2 days, then 4 tabs for 2 days, then 3 tabs for 2 days, 2 tabs for 2 days, then 1 tab by mouth daily for 2 days Patient  not taking: No sig reported 05/23/20   Stacey Drain, [place], PA-C  albuterol (PROVENTIL) (2.5 MG/3ML) 0.083% nebulizer solution Take 3 mLs (2.5 mg total) by nebulization every 6 (six) hours as needed for wheezing or shortness of breath. Patient not taking: Reported on 03/14/2020 03/22/18 05/23/20  Garner Nash, DO    Allergies    Patient has no known allergies.  Review of Systems   Review of Systems  Constitutional: Negative for fever.  Respiratory: Negative for cough and shortness of breath.   Cardiovascular: Negative for chest pain.  Gastrointestinal: Positive for abdominal pain, nausea and vomiting.  Genitourinary: Negative for dysuria and hematuria.  Neurological: Negative for headaches.  All other systems reviewed and are negative.   Physical Exam Updated Vital Signs BP (!) 153/91   Pulse 92   Temp 99.2 F (37.3 C) (Oral)   Resp (!) 31   Ht 6\' 4"  (1.93 m)   Wt 104.3 kg   SpO2 94%   BMI 27.99 kg/m   Physical Exam Vitals and nursing note reviewed.  Constitutional:      Appearance: Normal appearance. He is well-developed and well-nourished.  HENT:     Head: Normocephalic and atraumatic.     Mouth/Throat:     Mouth: Oropharynx is clear and moist and mucous membranes are normal.  Eyes:     General: Lids are normal.     Extraocular Movements: EOM normal.     Conjunctiva/sclera: Conjunctivae normal.     Pupils: Pupils are equal, round, and reactive to light.  Cardiovascular:     Rate and Rhythm: Normal rate and regular rhythm.     Pulses: Normal pulses.     Heart sounds: Normal heart sounds. No murmur heard. No friction rub. No gallop.   Pulmonary:     Effort: Pulmonary effort is normal.     Breath sounds: Normal breath sounds.     Comments: Lungs clear to auscultation bilaterally.  Symmetric chest rise.  No wheezing, rales, rhonchi. Abdominal:     Palpations: Abdomen is soft. Abdomen is not rigid.     Tenderness: There is abdominal tenderness in the right lower  quadrant, suprapubic area and left lower quadrant. There  is guarding.     Comments: Abdomen soft, nondistended.  He has tenderness tender diffusely in the lower abdomen but worse in the left lower quadrant.  He has some voluntary guarding noted.  Musculoskeletal:        General: Normal range of motion.     Cervical back: Full passive range of motion without pain.  Skin:    General: Skin is warm and dry.     Capillary Refill: Capillary refill takes less than 2 seconds.  Neurological:     Mental Status: He is alert and oriented to person, place, and time.  Psychiatric:        Mood and Affect: Mood and affect normal.        Speech: Speech normal.     ED Results / Procedures / Treatments   Labs (all labs ordered are listed, but only abnormal results are displayed) Labs Reviewed  COMPREHENSIVE METABOLIC PANEL - Abnormal; Notable for the following components:      Result Value   Glucose, Bld 158 (*)    Albumin 3.3 (*)    Total Bilirubin 1.3 (*)    All other components within normal limits  CBC - Abnormal; Notable for the following components:   WBC 15.9 (*)    All other components within normal limits  URINALYSIS, ROUTINE W REFLEX MICROSCOPIC - Abnormal; Notable for the following components:   Color, Urine AMBER (*)    APPearance HAZY (*)    Specific Gravity, Urine 1.032 (*)    Protein, ur 30 (*)    All other components within normal limits  I-STAT CHEM 8, ED - Abnormal; Notable for the following components:   Creatinine, Ser 0.60 (*)    Glucose, Bld 156 (*)    Calcium, Ion 1.12 (*)    All other components within normal limits  SARS CORONAVIRUS 2 (TAT 6-24 HRS)  LIPASE, BLOOD    EKG None  Radiology CT ABDOMEN PELVIS WO CONTRAST  Result Date: 08/10/2020 CLINICAL DATA:  Left lower quadrant pain EXAM: CT ABDOMEN AND PELVIS WITHOUT CONTRAST TECHNIQUE: Multidetector CT imaging of the abdomen and pelvis was performed following the standard protocol without IV contrast.  COMPARISON:  10/10/2019 FINDINGS: Lower chest: No acute abnormality. Hepatobiliary: Diffuse steatosis. Punctate calcified granuloma. Gallbladder is unremarkable. No biliary dilatation. Pancreas: Unremarkable. Spleen: Unremarkable. Adrenals/Urinary Tract: The adrenals are unremarkable. Small bilateral renal cysts are again identified. Bladder is partially obscured by artifact but otherwise unremarkable. Stomach/Bowel: Small hiatal hernia. Distal colonic diverticulosis. There is infiltration of the adjacent fat in the pelvis on the right with minimal ill-defined fluid. There are a few small foci of likely extraluminal air in this region (series 6, images 73 and 76). Minimal pneumoperitoneum is seen along the right hemidiaphragm. There is no evidence of abscess formation. Vascular/Lymphatic: Aortic atherosclerosis. No enlarged lymph nodes. Small nonenlarged retroperitoneal nodes are noted. Reproductive: Obscured by artifact.  Prostate seeds are present. Other: Unchanged umbilical hernia with protrusion of a small segment of nonobstructed small bowel. Musculoskeletal: No acute osseous abnormality. IMPRESSION: Acute sigmoid diverticulitis with small perforation. No evidence of abscess. Hepatic steatosis. Small hiatal hernia. These results were called by telephone at the time of interpretation on 08/10/2020 at 8:48 pm to provider ADAM CURATOLO , who verbally acknowledged these results. Electronically Signed   By: Macy Mis M.D.   On: 08/10/2020 20:51    Procedures Procedures (including critical care time)  Medications Ordered in ED Medications  piperacillin-tazobactam (ZOSYN) IVPB 3.375 g (0 g Intravenous Stopped  08/10/20 2149)  ondansetron (ZOFRAN) injection 4 mg (4 mg Intravenous Given 08/10/20 2147)  morphine 4 MG/ML injection 4 mg (4 mg Intravenous Given 08/10/20 2147)    ED Course  I have reviewed the triage vital signs and the nursing notes.  Pertinent labs & imaging results that were available during  my care of the patient were reviewed by me and considered in my medical decision making (see chart for details).    MDM Rules/Calculators/A&P                          65 year old male who presents for evaluation of abdominal pain, nausea/vomiting that began today.  No fevers, diarrhea.  Initially seen at urgent care and sent to the ED for further evaluation.  On initial arrival, he is afebrile, nontoxic-appearing.  On exam, he has diffuse abdominal tenderness worse in the left lower quadrant.  Concern for infectious process.  Labs, CT ordered at triage.  CMP shows normal BUN/creatinine.  CBC shows leukocytosis of 15.9.  Lipase is unremarkable.  UA shows no evidence of infectious etiology.  CT on pelvis shows acute sigmoid diverticulitis with small perforation.  No evidence of abscess.  Patient started on Zosyn.  Discussed patient with Dr. Grandville Silos (Gen Surg). He will plan to admit the patient.   Portions of this note were generated with [employer]. Dictation errors may occur despite best attempts at proofreading.   Final Clinical Impression(s) / ED Diagnoses Final diagnoses:  Diverticulitis    Rx / DC Orders ED Discharge Orders    None       Desma Mcgregor 08/10/20 2209    Charlesetta Shanks, MD 08/10/20 2358

## 2020-08-10 NOTE — Discharge Instructions (Addendum)
Please have your wife drive you to the emergency room now as I am concerned that you have an acute abdomen, diverticulitis given your severe abdominal pain and history of diverticulosis. We have given you hydrocodone for severe pain and Zofran injection for nausea and vomiting.

## 2020-08-11 LAB — CBC
HCT: 38.5 % — ABNORMAL LOW (ref 39.0–52.0)
Hemoglobin: 13 g/dL (ref 13.0–17.0)
MCH: 32.6 pg (ref 26.0–34.0)
MCHC: 33.8 g/dL (ref 30.0–36.0)
MCV: 96.5 fL (ref 80.0–100.0)
Platelets: 203 10*3/uL (ref 150–400)
RBC: 3.99 MIL/uL — ABNORMAL LOW (ref 4.22–5.81)
RDW: 13.3 % (ref 11.5–15.5)
WBC: 16.2 10*3/uL — ABNORMAL HIGH (ref 4.0–10.5)
nRBC: 0 % (ref 0.0–0.2)

## 2020-08-11 LAB — BASIC METABOLIC PANEL
Anion gap: 11 (ref 5–15)
BUN: 11 mg/dL (ref 8–23)
CO2: 24 mmol/L (ref 22–32)
Calcium: 8.4 mg/dL — ABNORMAL LOW (ref 8.9–10.3)
Chloride: 100 mmol/L (ref 98–111)
Creatinine, Ser: 0.8 mg/dL (ref 0.61–1.24)
GFR, Estimated: >60 mL/min (ref >60)
Glucose, Bld: 151 mg/dL — ABNORMAL HIGH (ref 70–99)
Potassium: 3.8 mmol/L (ref 3.5–5.1)
Sodium: 135 mmol/L (ref 135–145)

## 2020-08-11 LAB — HIV ANTIBODY (ROUTINE TESTING W REFLEX): HIV Screen 4th Generation wRfx: NONREACTIVE

## 2020-08-11 LAB — SARS CORONAVIRUS 2 (TAT 6-24 HRS): SARS Coronavirus 2: NEGATIVE

## 2020-08-11 MED ORDER — OXYCODONE HCL 5 MG PO TABS
5.0000 mg | ORAL_TABLET | ORAL | Status: DC | PRN
  Administered 2020-08-11 – 2020-08-19 (×35): 10 mg via ORAL
  Filled 2020-08-11 (×36): qty 2

## 2020-08-11 NOTE — Progress Notes (Signed)
Diverticulitis of colon  Subjective: Feels a bit better but still pretty sore  Objective: Vital signs in last 24 hours: Temp:  [98.1 F (36.7 C)-99.5 F (37.5 C)] 99 F (37.2 C) (01/08 0410) Pulse Rate:  [89-104] 95 (01/08 0410) Resp:  [17-31] 20 (01/08 0410) BP: (140-161)/(83-96) 145/89 (01/08 0410) SpO2:  [87 %-98 %] 98 % (01/08 0423) Weight:  [104.3 kg] 104.3 kg (01/07 2115)    Intake/Output from previous day: 01/07 0701 - 01/08 0700 In: 50 [IV Piggyback:50] Out: 600 [Urine:600] Intake/Output this shift: No intake/output data recorded.  General appearance: alert and cooperative GI: normal findings: soft, TTP RLQ  Lab Results:  Results for orders placed or performed during the hospital encounter of 08/10/20 (from the past 24 hour(s))  Urinalysis, Routine w reflex microscopic Urine, Clean Catch     Status: Abnormal   Collection Time: 08/10/20  8:13 PM  Result Value Ref Range   Color, Urine AMBER (A) YELLOW   APPearance HAZY (A) CLEAR   Specific Gravity, Urine 1.032 (H) 1.005 - 1.030   pH 5.0 5.0 - 8.0   Glucose, UA NEGATIVE NEGATIVE mg/dL   Hgb urine dipstick NEGATIVE NEGATIVE   Bilirubin Urine NEGATIVE NEGATIVE   Ketones, ur NEGATIVE NEGATIVE mg/dL   Protein, ur 30 (A) NEGATIVE mg/dL   Nitrite NEGATIVE NEGATIVE   Leukocytes,Ua NEGATIVE NEGATIVE   RBC / HPF 0-5 0 - 5 RBC/hpf   WBC, UA 0-5 0 - 5 WBC/hpf   Bacteria, UA NONE SEEN NONE SEEN   Squamous Epithelial / LPF 0-5 0 - 5   Mucus PRESENT   Lipase, blood     Status: None   Collection Time: 08/10/20  8:20 PM  Result Value Ref Range   Lipase 15 11 - 51 U/L  Comprehensive metabolic panel     Status: Abnormal   Collection Time: 08/10/20  8:20 PM  Result Value Ref Range   Sodium 137 135 - 145 mmol/L   Potassium 3.8 3.5 - 5.1 mmol/L   Chloride 101 98 - 111 mmol/L   CO2 23 22 - 32 mmol/L   Glucose, Bld 158 (H) 70 - 99 mg/dL   BUN 9 8 - 23 mg/dL   Creatinine, Ser 0.81 0.61 - 1.24 mg/dL   Calcium 9.0 8.9 - 10.3  mg/dL   Total Protein 7.1 6.5 - 8.1 g/dL   Albumin 3.3 (L) 3.5 - 5.0 g/dL   AST 27 15 - 41 U/L   ALT 25 0 - 44 U/L   Alkaline Phosphatase 60 38 - 126 U/L   Total Bilirubin 1.3 (H) 0.3 - 1.2 mg/dL   GFR, Estimated >60 >60 mL/min   Anion gap 13 5 - 15  CBC     Status: Abnormal   Collection Time: 08/10/20  8:20 PM  Result Value Ref Range   WBC 15.9 (H) 4.0 - 10.5 K/uL   RBC 4.42 4.22 - 5.81 MIL/uL   Hemoglobin 14.5 13.0 - 17.0 g/dL   HCT 42.3 39.0 - 52.0 %   MCV 95.7 80.0 - 100.0 fL   MCH 32.8 26.0 - 34.0 pg   MCHC 34.3 30.0 - 36.0 g/dL   RDW 13.3 11.5 - 15.5 %   Platelets 203 150 - 400 K/uL   nRBC 0.0 0.0 - 0.2 %  I-stat chem 8, ED (not at [facility] or [facility])     Status: Abnormal   Collection Time: 08/10/20  8:31 PM  Result Value Ref Range   Sodium 138 135 -  145 mmol/L   Potassium 3.9 3.5 - 5.1 mmol/L   Chloride 100 98 - 111 mmol/L   BUN 10 8 - 23 mg/dL   Creatinine, Ser 0.60 (L) 0.61 - 1.24 mg/dL   Glucose, Bld 156 (H) 70 - 99 mg/dL   Calcium, Ion 1.12 (L) 1.15 - 1.40 mmol/L   TCO2 24 22 - 32 mmol/L   Hemoglobin 15.3 13.0 - 17.0 g/dL   HCT 45.0 39.0 - 52.0 %  SARS CORONAVIRUS 2 (TAT 6-24 HRS) Nasopharyngeal Nasopharyngeal Swab     Status: None   Collection Time: 08/10/20  9:40 PM   Specimen: Nasopharyngeal Swab  Result Value Ref Range   SARS Coronavirus 2 NEGATIVE NEGATIVE  CBC     Status: Abnormal   Collection Time: 08/11/20 12:49 AM  Result Value Ref Range   WBC 16.2 (H) 4.0 - 10.5 K/uL   RBC 3.99 (L) 4.22 - 5.81 MIL/uL   Hemoglobin 13.0 13.0 - 17.0 g/dL   HCT 38.5 (L) 39.0 - 52.0 %   MCV 96.5 80.0 - 100.0 fL   MCH 32.6 26.0 - 34.0 pg   MCHC 33.8 30.0 - 36.0 g/dL   RDW 13.3 11.5 - 15.5 %   Platelets 203 150 - 400 K/uL   nRBC 0.0 0.0 - 0.2 %  Basic metabolic panel     Status: Abnormal   Collection Time: 08/11/20 12:49 AM  Result Value Ref Range   Sodium 135 135 - 145 mmol/L   Potassium 3.8 3.5 - 5.1 mmol/L   Chloride 100 98 - 111 mmol/L   CO2 24 22 - 32 mmol/L    Glucose, Bld 151 (H) 70 - 99 mg/dL   BUN 11 8 - 23 mg/dL   Creatinine, Ser 0.80 0.61 - 1.24 mg/dL   Calcium 8.4 (L) 8.9 - 10.3 mg/dL   GFR, Estimated >60 >60 mL/min   Anion gap 11 5 - 15     Studies/Results Radiology     MEDS, Scheduled . enoxaparin (LOVENOX) injection  40 mg Subcutaneous Q24H  . umeclidinium bromide  1 puff Inhalation Daily   And  . fluticasone furoate-vilanterol  1 puff Inhalation Daily  . pantoprazole (PROTONIX) IV  40 mg Intravenous QHS     Assessment: Diverticulitis of colon- no fluid collection  Plan: Cont IV abx and NPO.  Recheck labs in AM Ambulate  LOS: 1 day    Rosario Adie, MD [facility], [place]    08/11/2020 8:37 AM

## 2020-08-11 NOTE — Plan of Care (Signed)

## 2020-08-12 LAB — BASIC METABOLIC PANEL
Anion gap: 7 (ref 5–15)
BUN: 11 mg/dL (ref 8–23)
CO2: 26 mmol/L (ref 22–32)
Calcium: 8.3 mg/dL — ABNORMAL LOW (ref 8.9–10.3)
Chloride: 102 mmol/L (ref 98–111)
Creatinine, Ser: 0.87 mg/dL (ref 0.61–1.24)
GFR, Estimated: >60 mL/min (ref >60)
Glucose, Bld: 120 mg/dL — ABNORMAL HIGH (ref 70–99)
Potassium: 3.7 mmol/L (ref 3.5–5.1)
Sodium: 135 mmol/L (ref 135–145)

## 2020-08-12 LAB — CBC
HCT: 34.7 % — ABNORMAL LOW (ref 39.0–52.0)
Hemoglobin: 11.4 g/dL — ABNORMAL LOW (ref 13.0–17.0)
MCH: 32.2 pg (ref 26.0–34.0)
MCHC: 32.9 g/dL (ref 30.0–36.0)
MCV: 98 fL (ref 80.0–100.0)
Platelets: 160 10*3/uL (ref 150–400)
RBC: 3.54 MIL/uL — ABNORMAL LOW (ref 4.22–5.81)
RDW: 13.4 % (ref 11.5–15.5)
WBC: 11.5 10*3/uL — ABNORMAL HIGH (ref 4.0–10.5)
nRBC: 0 % (ref 0.0–0.2)

## 2020-08-12 NOTE — Plan of Care (Signed)
  Problem: Education: Goal: Knowledge of General Education information will improve Description: Including pain rating scale, medication(s)/side effects and non-pharmacologic comfort measures Outcome: Progressing   Problem: Health Behavior/Discharge Planning: Goal: Ability to manage health-related needs will improve Outcome: Progressing   Problem: Clinical Measurements: Goal: Ability to maintain clinical measurements within normal limits will improve Outcome: Progressing Goal: Will remain free from infection Outcome: Progressing Goal: Diagnostic test results will improve Outcome: Progressing Goal: Respiratory complications will improve Outcome: Progressing   Problem: Activity: Goal: Risk for activity intolerance will decrease Outcome: Progressing   Problem: Pain Managment: Goal: General experience of comfort will improve Outcome: Progressing   Problem: Safety: Goal: Ability to remain free from injury will improve Outcome: Progressing   Problem: Skin Integrity: Goal: Risk for impaired skin integrity will decrease Outcome: Progressing   

## 2020-08-12 NOTE — Progress Notes (Signed)
Diverticulitis of colon  Subjective: Had some increased pain after starting clears yesterday, denies nausea, no flatus.  Made NPO  Objective: Vital signs in last 24 hours: Temp:  [98.8 F (37.1 C)-100.7 F (38.2 C)] 99.1 F (37.3 C) (01/09 0500) Pulse Rate:  [87-103] 92 (01/09 0500) Resp:  [18-20] 18 (01/09 0500) BP: (134-168)/(75-95) 139/75 (01/09 0500) SpO2:  [90 %-96 %] 92 % (01/09 0500)    Intake/Output from previous day: 01/08 0701 - 01/09 0700 In: 0  Out: 675 [Urine:675] Intake/Output this shift: No intake/output data recorded.  General appearance: alert and cooperative GI: normal findings: soft, TTP RLQ  Lab Results:  Results for orders placed or performed during the hospital encounter of 08/10/20 (from the past 24 hour(s))  CBC     Status: Abnormal   Collection Time: 08/12/20 12:48 AM  Result Value Ref Range   WBC 11.5 (H) 4.0 - 10.5 K/uL   RBC 3.54 (L) 4.22 - 5.81 MIL/uL   Hemoglobin 11.4 (L) 13.0 - 17.0 g/dL   HCT 34.7 (L) 39.0 - 52.0 %   MCV 98.0 80.0 - 100.0 fL   MCH 32.2 26.0 - 34.0 pg   MCHC 32.9 30.0 - 36.0 g/dL   RDW 13.4 11.5 - 15.5 %   Platelets 160 150 - 400 K/uL   nRBC 0.0 0.0 - 0.2 %  Basic metabolic panel     Status: Abnormal   Collection Time: 08/12/20 12:48 AM  Result Value Ref Range   Sodium 135 135 - 145 mmol/L   Potassium 3.7 3.5 - 5.1 mmol/L   Chloride 102 98 - 111 mmol/L   CO2 26 22 - 32 mmol/L   Glucose, Bld 120 (H) 70 - 99 mg/dL   BUN 11 8 - 23 mg/dL   Creatinine, Ser 0.87 0.61 - 1.24 mg/dL   Calcium 8.3 (L) 8.9 - 10.3 mg/dL   GFR, Estimated >60 >60 mL/min   Anion gap 7 5 - 15     Studies/Results Radiology     MEDS, Scheduled . enoxaparin (LOVENOX) injection  40 mg Subcutaneous Q24H  . umeclidinium bromide  1 puff Inhalation Daily   And  . fluticasone furoate-vilanterol  1 puff Inhalation Daily  . pantoprazole (PROTONIX) IV  40 mg Intravenous QHS     Assessment: Diverticulitis of colon- no fluid  collection  Plan: Cont IV abx and NPO.  Recheck labs in AM Ambulate  LOS: 2 days    Rosario Adie, MD [facility], [place]    08/12/2020 8:34 AM

## 2020-08-13 LAB — CBC
HCT: 32.7 % — ABNORMAL LOW (ref 39.0–52.0)
Hemoglobin: 10.4 g/dL — ABNORMAL LOW (ref 13.0–17.0)
MCH: 31.3 pg (ref 26.0–34.0)
MCHC: 31.8 g/dL (ref 30.0–36.0)
MCV: 98.5 fL (ref 80.0–100.0)
Platelets: 151 10*3/uL (ref 150–400)
RBC: 3.32 MIL/uL — ABNORMAL LOW (ref 4.22–5.81)
RDW: 13.3 % (ref 11.5–15.5)
WBC: 8.5 10*3/uL (ref 4.0–10.5)
nRBC: 0 % (ref 0.0–0.2)

## 2020-08-13 LAB — BASIC METABOLIC PANEL
Anion gap: 8 (ref 5–15)
BUN: 12 mg/dL (ref 8–23)
CO2: 26 mmol/L (ref 22–32)
Calcium: 8.3 mg/dL — ABNORMAL LOW (ref 8.9–10.3)
Chloride: 100 mmol/L (ref 98–111)
Creatinine, Ser: 0.85 mg/dL (ref 0.61–1.24)
GFR, Estimated: >60 mL/min (ref >60)
Glucose, Bld: 93 mg/dL (ref 70–99)
Potassium: 3.5 mmol/L (ref 3.5–5.1)
Sodium: 134 mmol/L — ABNORMAL LOW (ref 135–145)

## 2020-08-13 NOTE — Progress Notes (Signed)
       Subjective: Still having a lot of sharp pain in RQL, but states his other lower abdominal pain is somewhat improved.  Minimal intermittent nausea.  No BM  ROS: See above, otherwise other systems negative  Objective: Vital signs in last 24 hours: Temp:  [97.3 F (36.3 C)-99.6 F (37.6 C)] 97.3 F (36.3 C) (01/10 0402) Pulse Rate:  [80-90] 80 (01/10 0402) Resp:  [16-17] 17 (01/09 2141) BP: (126-151)/(70-123) 133/76 (01/10 0402) SpO2:  [90 %-94 %] 93 % (01/10 0747)    Intake/Output from previous day: 01/09 0701 - 01/10 0700 In: 50 [P.O.:50] Out: 700 [Urine:700] Intake/Output this shift: No intake/output data recorded.  PE: Heart: regular Lungs: CTAB Abd: soft, tender across lower abdomen, but no guarding or peritonitis, greatest in RLQ, +BS  Lab Results:  Recent Labs    08/12/20 0048 08/13/20 0048  WBC 11.5* 8.5  HGB 11.4* 10.4*  HCT 34.7* 32.7*  PLT 160 151   BMET Recent Labs    08/12/20 0048 08/13/20 0048  NA 135 134*  K 3.7 3.5  CL 102 100  CO2 26 26  GLUCOSE 120* 93  BUN 11 12  CREATININE 0.87 0.85  CALCIUM 8.3* 8.3*   PT/INR No results for input(s): LABPROT, INR in the last 72 hours. CMP     Component Value Date/Time   NA 134 (L) 08/13/2020 0048   K 3.5 08/13/2020 0048   CL 100 08/13/2020 0048   CO2 26 08/13/2020 0048   GLUCOSE 93 08/13/2020 0048   BUN 12 08/13/2020 0048   CREATININE 0.85 08/13/2020 0048   CALCIUM 8.3 (L) 08/13/2020 0048   PROT 7.1 08/10/2020 2020   ALBUMIN 3.3 (L) 08/10/2020 2020   AST 27 08/10/2020 2020   ALT 25 08/10/2020 2020   ALKPHOS 60 08/10/2020 2020   BILITOT 1.3 (H) 08/10/2020 2020   GFRNONAA >60 08/13/2020 0048   GFRAA >60 10/13/2019 0539   Lipase     Component Value Date/Time   LIPASE 15 08/10/2020 2020       Studies/Results: No results found.  Anti-infectives: Anti-infectives (From admission, onward)   Start     Dose/Rate Route Frequency Ordered Stop   08/11/20 0600   piperacillin-tazobactam (ZOSYN) IVPB 3.375 g        3.375 g 12.5 mL/hr over 240 Minutes Intravenous Every 8 hours 08/10/20 2300     08/10/20 2130  piperacillin-tazobactam (ZOSYN) IVPB 3.375 g        3.375 g 100 mL/hr over 30 Minutes Intravenous  Once 08/10/20 2122 08/10/20 2149       Assessment/Plan OSA - on Cpap COPD - on O2 prn HTN - doesn't appear to be on meds at home.  BP ok currently  Diverticulitis with microperforation -WBC has normalized today.  [place] for clear liquids -still with some pain though.  Follow clinically.  If this persists, may need repeat CT scan to rule out abscess development -mobilize/pulm toilet -cont IV zosyn  FEN - IVFs/CLD VTE - lovenox ID - zosyn   LOS: 3 days    Henreitta Cea , [facility]: [facility] 08/13/2020, 8:44 AM Please see Amion for pager number during day hours 7:00am-4:30pm or 7:00am -11:30am on weekends

## 2020-08-13 NOTE — Plan of Care (Signed)

## 2020-08-14 ENCOUNTER — Encounter (HOSPITAL_COMMUNITY): Payer: Self-pay | Admitting: General Surgery

## 2020-08-14 ENCOUNTER — Inpatient Hospital Stay (HOSPITAL_COMMUNITY): Payer: 59

## 2020-08-14 LAB — GLUCOSE, CAPILLARY: Glucose-Capillary: 80 mg/dL (ref 70–99)

## 2020-08-14 MED ORDER — IOHEXOL 300 MG/ML  SOLN
100.0000 mL | Freq: Once | INTRAMUSCULAR | Status: AC | PRN
  Administered 2020-08-14: 100 mL via INTRAVENOUS

## 2020-08-14 MED ORDER — FLUTICASONE-UMECLIDIN-VILANT 100-62.5-25 MCG/INH IN AEPB
1.0000 | INHALATION_SPRAY | Freq: Every day | RESPIRATORY_TRACT | Status: DC
  Administered 2020-08-14 – 2020-08-22 (×8): 1 via RESPIRATORY_TRACT

## 2020-08-14 NOTE — Progress Notes (Signed)
Pharmacy sent up paperwork to keep inhaler bedside. Pt administered one puff bedside with RN watching at 0945.  Sent paperwork back to pharmacy.

## 2020-08-14 NOTE — Progress Notes (Signed)
Pt requesting to take his home Trelegy medication instead of Breo and Incruse. Pt states RN checking with Pharmacy.

## 2020-08-14 NOTE — Progress Notes (Addendum)
New redness and warmth over lower right quadrant. 12 cm x 32 cm. Continues to be tender to touch.

## 2020-08-14 NOTE — Progress Notes (Signed)
       Subjective: Still having a lot of sharp pain in RLQ.  Having some intermittent incontinent issues with his urine when he has these pains.  Increase need of O2 when he has pain and overnight.  Still using CPAP and [place] during day.  Currently on 5L, but pulling full IS volume.  ROS: See above, otherwise other systems negative  Objective: Vital signs in last 24 hours: Temp:  [98.4 F (36.9 C)-100.2 F (37.9 C)] 98.4 F (36.9 C) (01/11 0431) Pulse Rate:  [79-86] 82 (01/11 0431) Resp:  [17-19] 18 (01/11 0431) BP: (131-152)/(70-96) 149/79 (01/11 0431) SpO2:  [90 %-95 %] 90 % (01/11 0431) Last BM Date:  (PTA)  Intake/Output from previous day: 01/10 0701 - 01/11 0700 In: 7542.6 [P.O.:715; I.V.:6481.7; IV Piggyback:345.9] Out: 1200 [Urine:1200] Intake/Output this shift: Total I/O In: 300 [P.O.:300] Out: 350 [Urine:350]  PE: Heart: regular Lungs: CTAB Abd: soft, tender across lower abdomen, but no guarding or peritonitis, greatest in RLQ, +BS  Lab Results:  Recent Labs    08/12/20 0048 08/13/20 0048  WBC 11.5* 8.5  HGB 11.4* 10.4*  HCT 34.7* 32.7*  PLT 160 151   BMET Recent Labs    08/12/20 0048 08/13/20 0048  NA 135 134*  K 3.7 3.5  CL 102 100  CO2 26 26  GLUCOSE 120* 93  BUN 11 12  CREATININE 0.87 0.85  CALCIUM 8.3* 8.3*   PT/INR No results for input(s): LABPROT, INR in the last 72 hours. CMP     Component Value Date/Time   NA 134 (L) 08/13/2020 0048   K 3.5 08/13/2020 0048   CL 100 08/13/2020 0048   CO2 26 08/13/2020 0048   GLUCOSE 93 08/13/2020 0048   BUN 12 08/13/2020 0048   CREATININE 0.85 08/13/2020 0048   CALCIUM 8.3 (L) 08/13/2020 0048   PROT 7.1 08/10/2020 2020   ALBUMIN 3.3 (L) 08/10/2020 2020   AST 27 08/10/2020 2020   ALT 25 08/10/2020 2020   ALKPHOS 60 08/10/2020 2020   BILITOT 1.3 (H) 08/10/2020 2020   GFRNONAA >60 08/13/2020 0048   GFRAA >60 10/13/2019 0539   Lipase     Component Value Date/Time   LIPASE 15 08/10/2020 2020        Studies/Results: No results found.  Anti-infectives: Anti-infectives (From admission, onward)   Start     Dose/Rate Route Frequency Ordered Stop   08/11/20 0600  piperacillin-tazobactam (ZOSYN) IVPB 3.375 g        3.375 g 12.5 mL/hr over 240 Minutes Intravenous Every 8 hours 08/10/20 2300     08/10/20 2130  piperacillin-tazobactam (ZOSYN) IVPB 3.375 g        3.375 g 100 mL/hr over 30 Minutes Intravenous  Once 08/10/20 2122 08/10/20 2149       Assessment/Plan OSA - on Cpap COPD - on O2 prn HTN - doesn't appear to be on meds at home.  BP ok currently  Diverticulitis with microperforation -WBC has normalized today. CLD today and NPO p MN tonight for CT scan tomorrow given her still has a lot of pain in RLQ.  Concern for abscess. -mobilize/pulm toilet -cont IV zosyn  FEN - IVFs/CLD, NPO p MN VTE - lovenox ID - zosyn   LOS: 4 days    Henreitta Cea , [facility] Surgery 08/14/2020, 11:29 AM Please see Amion for pager number during day hours 7:00am-4:30pm or 7:00am -11:30am on weekends

## 2020-08-14 NOTE — Progress Notes (Signed)
Pt places self  on/off cpap as needed.  Rt will continue to monitor.

## 2020-08-14 NOTE — Progress Notes (Signed)
At [facility] IV team added a second IV for CT scan. At [facility] pt off unit to CT scan.

## 2020-08-14 NOTE — Progress Notes (Signed)
Pt returned from CT scan.

## 2020-08-15 ENCOUNTER — Inpatient Hospital Stay (HOSPITAL_COMMUNITY): Payer: 59

## 2020-08-15 LAB — BASIC METABOLIC PANEL
Anion gap: 11 (ref 5–15)
BUN: 6 mg/dL — ABNORMAL LOW (ref 8–23)
CO2: 28 mmol/L (ref 22–32)
Calcium: 9 mg/dL (ref 8.9–10.3)
Chloride: 98 mmol/L (ref 98–111)
Creatinine, Ser: 0.83 mg/dL (ref 0.61–1.24)
GFR, Estimated: >60 mL/min (ref >60)
Glucose, Bld: 83 mg/dL (ref 70–99)
Potassium: 4.1 mmol/L (ref 3.5–5.1)
Sodium: 137 mmol/L (ref 135–145)

## 2020-08-15 LAB — CBC
HCT: 38 % — ABNORMAL LOW (ref 39.0–52.0)
Hemoglobin: 12.2 g/dL — ABNORMAL LOW (ref 13.0–17.0)
MCH: 30.8 pg (ref 26.0–34.0)
MCHC: 32.1 g/dL (ref 30.0–36.0)
MCV: 96 fL (ref 80.0–100.0)
Platelets: 210 10*3/uL (ref 150–400)
RBC: 3.96 MIL/uL — ABNORMAL LOW (ref 4.22–5.81)
RDW: 13 % (ref 11.5–15.5)
WBC: 6.9 10*3/uL (ref 4.0–10.5)
nRBC: 0 % (ref 0.0–0.2)

## 2020-08-15 LAB — PROTIME-INR
INR: 1.2 (ref 0.8–1.2)
Prothrombin Time: 15.2 seconds (ref 11.4–15.2)

## 2020-08-15 MED ORDER — MIDAZOLAM HCL 2 MG/2ML IJ SOLN
INTRAMUSCULAR | Status: AC | PRN
  Administered 2020-08-15: 1 mg via INTRAVENOUS

## 2020-08-15 MED ORDER — MIDAZOLAM HCL 2 MG/2ML IJ SOLN
INTRAMUSCULAR | Status: AC
  Filled 2020-08-15: qty 4

## 2020-08-15 MED ORDER — FENTANYL CITRATE (PF) 100 MCG/2ML IJ SOLN
INTRAMUSCULAR | Status: AC | PRN
  Administered 2020-08-15 (×2): 25 ug via INTRAVENOUS

## 2020-08-15 MED ORDER — SODIUM CHLORIDE 0.9% FLUSH
5.0000 mL | Freq: Three times a day (TID) | INTRAVENOUS | Status: DC
  Administered 2020-08-15 – 2020-08-22 (×19): 5 mL

## 2020-08-15 MED ORDER — FENTANYL CITRATE (PF) 100 MCG/2ML IJ SOLN
INTRAMUSCULAR | Status: AC
  Filled 2020-08-15: qty 4

## 2020-08-15 NOTE — Consult Note (Signed)
Chief Complaint: Patient was seen in consultation today for intra-abdominal fluid collection/aspiration and drainage.  Referring Physician(s): Saverio Danker ([place])  Supervising Physician: Ruthann Cancer  Patient Status: [facility] - In-pt  History of Present Illness: [name] is a 65 y.o. male with a past medical history of hypertension, DVT 2020, COPD, spontaneous PTX 1984, pneumonia, esophageal stricture, hiatal hernia, GERD, pancreatitis, diverticulitis, prostate cancer, gout, DDD, chronic back pain, and OSA on CPAP. He presented to [facility] 08/10/2020 with complaints of abdominal pain. At [facility], patient concerning for acute abdomen and was recommended for ED for CT. He then presented to [facility] ED same day with same complaint. In ED, CT abdomen/pelvis 08/10/2020 revealed acute sigmoid diverticulitis with small perforation without evidence of abscess. General surgery was consulted. He was started on IV antibiotics and admitted for further management. Abdominal pain persisted, and repeat CT abdomen/pelvis 08/14/2020 concerning for intra-abdominal abscess.  CT abdomen/pelvis 08/10/2020: 1. Acute sigmoid diverticulitis with small perforation. No evidence of abscess. 2. Hepatic steatosis. 3. Small hiatal hernia.  CT abdomen/pevlis 08/14/2020: 1. Redemonstrated perforated sigmoid diverticulitis with new pericolonic and right-sided collections consistent with abscess. Three collections are noted in the pelvis and right pericolic gutter, largest inferior to the liver tip measures 6.7 x 2 x 2.0 cm. 2. Small right pleural effusion has developed since prior exam. 3. Hepatomegaly and hepatic steatosis. 4. Small fat containing umbilical hernia, with increased edema in the adjacent periumbilical fat. 5. Multiple small lymph nodes scattered throughout the central and distal mesentery, likely reactive.  IR consulted by Saverio Danker, PA-C for possible image-guided intra-abdominal fluid collection aspiration/drain  placement. Patient awake and alert standing in room. Complains of RLQ pain, rated 5-6/10 at this time. States quick movement exacerbates pain, rated "more than 10"/10. Denies N/V associated with pain. Denies fever, chills, chest pain, dyspnea, or headache.  LD SQ Lovenox 08/14/2020 at 0823.   Past Medical History:  Diagnosis Date  . Bilateral shoulder bursitis   . Chronic back pain   . COPD (chronic obstructive pulmonary disease) with emphysema ([place])   . DDD (degenerative disc disease), lumbar    hx epideral injection  L5--S1   . Diverticulitis   . DVT (deep venous thrombosis) ([place]) 11/19/2018  . Dysphasia    intermittant w/ food   . Dyspnea   . Dyspnea on exertion   . GERD (gastroesophageal reflux disease)   . Gout    L great toe  . Hiatal hernia   . Hip problem 2020   left, seeing orthopedics  . History of acute pancreatitis    alcoholic pancreatitis [id number] and 01-30-2012  . History of colon polyps    10-17-2005  hyperplastic polyp's  . History of esophageal stricture    s/p  dilation 02-02-2017  . History of peptic ulcer 1990s  . History of pneumococcal pneumonia 2006   bilateral pneumonia w/ left lower lobe abscess treated w/ chest tube with suction for drainage  . History of pneumothorax    1984--  left spontaneous pneumothorax ,  treated w/ chest tube  . Hypertension   . Malignant neoplasm of prostate [facility]) urologist-  dr dahlstedt/  oncologist -- dr Tammi Klippel   dx 12-02-2016-- Stage T2b, Gleason 3+4,  PSA 5.99,  vol 25cc  . Numbness of left foot    outer left ankle numb-- per pt has appt. w/ neurologist  . OSA (obstructive sleep apnea)    per pt has not used cpap over year ago from 04-16-2017--- per study 01-17-2010  severe osa  . Solid nodule of lung greater than 8 mm in diameter 05/11/2018   03/2018: RUL Nodule, 66mm  . Wears glasses     Past Surgical History:  Procedure Laterality Date  . BIOPSY  02/02/2017   Procedure: BIOPSY;  Surgeon: Daneil Dolin,  MD;  [facility];  Service: Endoscopy;;  duodenal bx's, esophgeal bx's  . BIOPSY  07/30/2017   Procedure: BIOPSY;  Surgeon: Daneil Dolin, MD;  [facility];  Service: Endoscopy;;  esophagus  . CARDIOVASCULAR STRESS TEST  09/12/2009   normal nuclear perfusion study w/ no ischemia /  normal LV function and wall motion , ef 57%  . COLONOSCOPY  2007   Dr. Gala Romney: hyperplastic polyps, internal hemorrhoids   . COLONOSCOPY WITH PROPOFOL N/A 05/03/2018   Dr. Gala Romney: diverticulosis in entire colon, multiple polyps in sigmoid, at splenic flexure, and ascending colon, not all polyps removed. Tubular adenomas and inflammatory polyps. Needs 3 year surveilance  . EPIDURAL BLOCK INJECTION  2020  . ESOPHAGOGASTRODUODENOSCOPY (EGD) WITH PROPOFOL N/A 02/02/2017   Dr. Gala Romney: esophageal stenosis s/p dilation and biopsy, medium sized hiatal hernia, normal duodenum  . ESOPHAGOGASTRODUODENOSCOPY (EGD) WITH PROPOFOL N/A 07/30/2017   Dr. Gala Romney: esophageal stenosis s/p dilation and biopsy, medium-sized hiatal hernia, normal duodenum  . HEMORRHOID SURGERY  01-21-2008    [facility]  . MALONEY DILATION N/A 02/02/2017   Procedure: Venia Minks DILATION;  Surgeon: Daneil Dolin, MD;  [facility];  Service: Endoscopy;  Laterality: N/A;  . Venia Minks DILATION N/A 07/30/2017   Procedure: Venia Minks DILATION;  Surgeon: Daneil Dolin, MD;  [facility];  Service: Endoscopy;  Laterality: N/A;  . POLYPECTOMY  05/03/2018   Procedure: POLYPECTOMY;  Surgeon: Daneil Dolin, MD;  [facility];  Service: Endoscopy;;  ascending colon polyp, sigmoid colon polyps (multiple)  . RADIOACTIVE SEED IMPLANT N/A 04/23/2017   Procedure: RADIOACTIVE SEED IMPLANT/BRACHYTHERAPY IMPLANT;  Surgeon: Franchot Gallo, MD;  Location: [facility];  Service: Urology;  Laterality: N/A;  . SHOULDER ARTHROSCOPY  08/05/2003  . SPACE OAR INSTILLATION N/A 04/23/2017   Procedure: SPACE OAR  INSTILLATION;  Surgeon: Franchot Gallo, MD;  Location: [facility];  Service: Urology;  Laterality: N/A;  . TRANSTHORACIC ECHOCARDIOGRAM  03/28/2009   mild LVH, ef 55-60%/ mild aorta calcification  . VASECTOMY  08/05/1983  . VIDEO ASSISTED THORACOSCOPY (VATS)/DECORTICATION Left     Allergies: Patient has no known allergies.  Medications: Prior to Admission medications   Medication Sig Start Date End Date Taking? Authorizing Provider  Fluticasone-Umeclidin-Vilant (TRELEGY ELLIPTA) 100-62.5-25 MCG/INH AEPB Inhale 1 puff into the lungs daily. 08/16/19  Yes Icard, Bradley L, DO  Ibuprofen 200 MG CAPS Take 1 capsule by mouth daily as needed (For pain).   Yes [provider]  lidocaine (LIDODERM) 5 % Place 1 patch onto the skin daily as needed (For back pain). Remove & Discard patch within 12 hours or as directed by MD   Yes [provider]  predniSONE (STERAPRED UNI-PAK 21 TAB) 10 MG (21) TBPK tablet Take by mouth daily. Take 6 tabs by mouth daily  for 2 days, then 5 tabs for 2 days, then 4 tabs for 2 days, then 3 tabs for 2 days, 2 tabs for 2 days, then 1 tab by mouth daily for 2 days Patient not taking: No sig reported 05/23/20   Wurst, [place], PA-C  albuterol (PROVENTIL) (2.5 MG/3ML) 0.083% nebulizer solution Take 3 mLs (  2.5 mg total) by nebulization every 6 (six) hours as needed for wheezing or shortness of breath. Patient not taking: Reported on 03/14/2020 03/22/18 05/23/20  Garner Nash, DO     Family History  Problem Relation Age of Onset  . Diabetes Mother   . Heart disease Father   . Heart disease Brother   . Cancer Neg Hx   . Colon cancer Neg Hx   . Colon polyps Neg Hx   . Neuropathy Neg Hx     Social History   Socioeconomic History  . Marital status: Married    Spouse name: Not on file  . Number of children: 2  . Years of education: Not on file  . Highest education level: Bachelor's degree (e.g., BA, AB, BS)  Occupational History   . Not on file  Tobacco Use  . Smoking status: Former Smoker    Packs/day: 1.00    Years: 30.00    Pack years: 30.00    Types: Cigarettes, E-cigarettes    Quit date: 10/03/2015    Years since quitting: 4.8  . Smokeless tobacco: Never Used  . Tobacco comment: stoped vaping 2018  Vaping Use  . Vaping Use: Former  Substance and Sexual Activity  . Alcohol use: Yes    Alcohol/week: 21.0 standard drinks    Types: 21 Shots of liquor per week    Comment: 2-3 drinks daily (Wine); 01/04/19 pt states he hasn't had anything to drink in 2-3 months  . Drug use: No  . Sexual activity: Yes    Birth control/protection: None  Other Topics Concern  . Not on file  Social History Narrative   Lives at home with his wife   Right handed   Social Determinants of Health   Financial Resource Strain: Not on file  Food Insecurity: Not on file  Transportation Needs: Not on file  Physical Activity: Not on file  Stress: Not on file  Social Connections: Not on file     Review of Systems: A 12 point ROS discussed and pertinent positives are indicated in the HPI above.  All other systems are negative.  Review of Systems  Constitutional: Negative for chills and fever.  Respiratory: Negative for shortness of breath and wheezing.   Cardiovascular: Negative for chest pain and palpitations.  Gastrointestinal: Positive for abdominal pain. Negative for nausea and vomiting.  Neurological: Negative for headaches.  Psychiatric/Behavioral: Negative for behavioral problems and confusion.    Vital Signs: BP (!) 157/90 (BP Location: Right Arm)   Pulse 73   Temp 97.8 F (36.6 C)   Resp 18   Ht 6\' 4"  (1.93 m)   Wt 229 lb 15 oz (104.3 kg)   SpO2 98%   BMI 27.99 kg/m   Physical Exam Vitals and nursing note reviewed.  Constitutional:      General: He is not in acute distress.    Appearance: Normal appearance.  Cardiovascular:     Rate and Rhythm: Normal rate and regular rhythm.     Heart sounds: Normal  heart sounds. No murmur heard.   Pulmonary:     Effort: Pulmonary effort is normal. No respiratory distress.     Breath sounds: Normal breath sounds. No wheezing.  Abdominal:     Palpations: Abdomen is soft.     Comments: (+) RLQ tenderness.  Skin:    General: Skin is warm and dry.  Neurological:     Mental Status: He is alert and oriented to person, place, and time.  MD Evaluation Airway: WNL Heart: WNL Abdomen: WNL Chest/ Lungs: WNL ASA  Classification: 3 Mallampati/Airway Score: Two   Imaging: CT ABDOMEN PELVIS WO CONTRAST  Result Date: 08/10/2020 CLINICAL DATA:  Left lower quadrant pain EXAM: CT ABDOMEN AND PELVIS WITHOUT CONTRAST TECHNIQUE: Multidetector CT imaging of the abdomen and pelvis was performed following the standard protocol without IV contrast. COMPARISON:  10/10/2019 FINDINGS: Lower chest: No acute abnormality. Hepatobiliary: Diffuse steatosis. Punctate calcified granuloma. Gallbladder is unremarkable. No biliary dilatation. Pancreas: Unremarkable. Spleen: Unremarkable. Adrenals/Urinary Tract: The adrenals are unremarkable. Small bilateral renal cysts are again identified. Bladder is partially obscured by artifact but otherwise unremarkable. Stomach/Bowel: Small hiatal hernia. Distal colonic diverticulosis. There is infiltration of the adjacent fat in the pelvis on the right with minimal ill-defined fluid. There are a few small foci of likely extraluminal air in this region (series 6, images 73 and 76). Minimal pneumoperitoneum is seen along the right hemidiaphragm. There is no evidence of abscess formation. Vascular/Lymphatic: Aortic atherosclerosis. No enlarged lymph nodes. Small nonenlarged retroperitoneal nodes are noted. Reproductive: Obscured by artifact.  Prostate seeds are present. Other: Unchanged umbilical hernia with protrusion of a small segment of nonobstructed small bowel. Musculoskeletal: No acute osseous abnormality. IMPRESSION: Acute sigmoid  diverticulitis with small perforation. No evidence of abscess. Hepatic steatosis. Small hiatal hernia. These results were called by telephone at the time of interpretation on 08/10/2020 at 8:48 pm to provider ADAM CURATOLO , who verbally acknowledged these results. Electronically Signed   By: Macy Mis M.D.   On: 08/10/2020 20:51   CT ABDOMEN PELVIS W CONTRAST  Result Date: 08/14/2020 CLINICAL DATA:  Follow-up diverticulitis. EXAM: CT ABDOMEN AND PELVIS WITH CONTRAST TECHNIQUE: Multidetector CT imaging of the abdomen and pelvis was performed using the standard protocol following bolus administration of intravenous contrast. CONTRAST:  179mL OMNIPAQUE IOHEXOL 300 MG/ML  SOLN COMPARISON:  CT 4 days ago 08/10/2020 FINDINGS: Lower chest: Small right pleural effusion has developed since prior exam. Adjacent compressive atelectasis. Minor left lower lobe atelectasis. There are coronary artery calcifications. Hepatobiliary: Enlarged liver spanning 25.2 cm cranial caudal with steatosis. Distended gallbladder. No calcified gallstone. No biliary dilatation. Pancreas: Punctate calcification in the pancreatic head. No ductal dilatation or inflammation. Spleen: Upper normal spanning 13.1 cm cranial caudal. No focal abnormality. Adrenals/Urinary Tract: Normal adrenal glands. No hydronephrosis or perinephric edema. Homogeneous renal enhancement with symmetric excretion on delayed phase imaging. 2.5 cm parapelvic cyst in the mid right kidney. 2.1 cm simple cyst in the upper left kidney. Urinary bladder is only minimally distended, primarily obscured by streak artifact from bilateral hip arthroplasties. Stomach/Bowel: Redundant sigmoid colon with perforated diverticulitis involving the proximal sigmoid in the pelvis just to the right of midline again demonstrated. There is a new pericolonic air fluid collection that measures 4.0 x 3.4 x 3.1 cm, series 3, image 75. Small fluid collection just anterior to the right iliac  vessels and posterior to the cecum measures 3.1 x 2.5 x 3.6 cm, series 3, image 68. Crescentic fluid collection in the right pericolic gutter abutting the inferior tip of the liver measures 6.7 x 2.0 x 2.2 cm, series 3, image 59. Additional ill-defined fluid and stranding in the pelvis and right pericolic gutter. Administered enteric contrast reaches the proximal transverse colon. There is no extraluminal contrast, however contrast does not reach the site of perforation in the sigmoid. Multiple additional noninflamed diverticula without additional sites of acute diverticulitis. Small hiatal hernia. No small bowel obstruction, inflammation, or ileus. Vascular/Lymphatic: Multiple small lymph  nodes scattered throughout both the central and distal mesentery. For example 6 mm node series 3, image 66 centrally. There multiple periportal and retroperitoneal nodes. No evidence of portal or mesenteric vein thrombosis. Aorto bi-iliac atherosclerosis. 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 Other: Pelvic and right side of fluid collections as described above. Foci of pneumoperitoneum in the upper abdomen have improved. There is a small fat containing umbilical hernia, with increased edema in the adjacent periumbilical fat. Musculoskeletal: Bilateral hip arthroplasties. There are no acute or suspicious osseous abnormalities. IMPRESSION: 1. Redemonstrated perforated sigmoid diverticulitis with new pericolonic and right-sided collections consistent with abscess. Three collections are noted in the pelvis and right pericolic gutter, largest inferior to the liver tip measures 6.7 x 2 x 2.0 cm. 2. Small right pleural effusion has developed since prior exam. 3. Hepatomegaly and hepatic steatosis. 4. Small fat containing umbilical hernia, with increased edema in the adjacent periumbilical fat. 5. Multiple small lymph nodes scattered throughout the central and  distal mesentery, likely reactive. Aortic Atherosclerosis (ICD10-I70.0). Electronically Signed   By: Keith Rake M.D.   On: 08/14/2020 20:05    Labs:  CBC: Recent Labs    08/11/20 0049 08/12/20 0048 08/13/20 0048 08/15/20 0032  WBC 16.2* 11.5* 8.5 6.9  HGB 13.0 11.4* 10.4* 12.2*  HCT 38.5* 34.7* 32.7* 38.0*  PLT 203 160 151 210    COAGS: Recent Labs    08/15/20 0613  INR 1.2    BMP: Recent Labs    10/10/19 0544 10/11/19 0537 10/12/19 0434 10/13/19 0539 08/10/20 2020 08/11/20 0049 08/12/20 0048 08/13/20 0048 08/15/20 0032  NA 139 135 135 136   < > 135 135 134* 137  K 4.1 3.8 3.5 3.5   < > 3.8 3.7 3.5 4.1  CL 99 100 101 103   < > 100 102 100 98  CO2 26 26 26 27    < > 24 26 26 28   GLUCOSE 151* 137* 130* 112*   < > 151* 120* 93 83  BUN 16 17 13 13    < > 11 11 12  6*  CALCIUM 9.3 8.5* 8.5* 8.1*   < > 8.4* 8.3* 8.3* 9.0  CREATININE 0.94 0.81 0.78 0.74   < > 0.80 0.87 0.85 0.83  GFRNONAA >60 >60 >60 >60   < > >60 >60 >60 >60  GFRAA >60 >60 >60 >60  --   --   --   --   --    < > = values in this interval not displayed.    LIVER FUNCTION TESTS: Recent Labs    10/10/19 0544 10/11/19 0537 10/13/19 0539 08/10/20 2020  BILITOT 0.8 1.2 1.0 1.3*  AST 28 30 22 27   ALT 26 19 17 25   ALKPHOS 79 56 49 60  PROT 7.8 6.4* 6.1* 7.1  ALBUMIN 4.3 3.4* 2.8* 3.3*     Assessment and Plan:  History of acute perforated diverticulitis with associated intra-abdominal fluid collection concerning for abscess. Plan for image-guided intra-abdominal fluid collection aspiration/drain placement. Patient is NPO. Afebrile. Lovenox held per IR protocol. INR 1.2 this AM.  Risks and benefits discussed with the patient including bleeding, infection, damage to adjacent structures, bowel perforation/fistula connection, and sepsis. All of the patient's questions were answered, patient is agreeable to proceed. Consent signed and in IR control room.   Thank you for this interesting  consult.  I greatly enjoyed meeting JAVAUN DIMPERIO and look forward to participating in their  care.  A copy of this report was sent to the requesting provider on this date.  Electronically Signed: Earley Abide, PA-C 08/15/2020, 10:08 AM   I spent a total of 40 Minutes in face to face in clinical consultation, greater than 50% of which was counseling/coordinating care for intra-abdominal fluid collection/aspiration and drainage.

## 2020-08-15 NOTE — Progress Notes (Signed)
CPAP is at bedside, patient will put on soon but manages it hisself he stated.  RN supposed to bring medicine for pain, patient stated he had called them.  Will continue to monitor.

## 2020-08-15 NOTE — Progress Notes (Signed)
Subjective: Patient continues to have significant RLQ abdominal pain. No fevers or tachycardia. WBC 8. On 5L oxygen.  Objective: Vital signs in last 24 hours: Temp:  [97.8 F (36.6 C)-99.3 F (37.4 C)] 97.8 F (36.6 C) (01/12 0447) Pulse Rate:  [73-81] 73 (01/12 0447) Resp:  [17-18] 18 (01/12 0447) BP: (138-157)/(89-90) 157/90 (01/12 0447) SpO2:  [92 %-100 %] 92 % (01/12 0447) Last BM Date:  (PTA)  Intake/Output from previous day: 01/11 0701 - 01/12 0700 In: 300 [P.O.:300] Out: 850 [Urine:850] Intake/Output this shift: No intake/output data recorded.  PE: General: resting comfortably, NAD Neuro: alert and oriented, no focal deficits Resp: normal work of breathing on nasal cannula Abdomen: soft, mildly distended, tender to palpation in RLQ. Mild RLQ abdominal wall erythema but no induration or fluctuance. Extremities: warm and well-perfused   Lab Results:  Recent Labs    08/13/20 0048 08/15/20 0032  WBC 8.5 6.9  HGB 10.4* 12.2*  HCT 32.7* 38.0*  PLT 151 210   BMET Recent Labs    08/13/20 0048 08/15/20 0032  NA 134* 137  K 3.5 4.1  CL 100 98  CO2 26 28  GLUCOSE 93 83  BUN 12 6*  CREATININE 0.85 0.83  CALCIUM 8.3* 9.0   PT/INR Recent Labs    08/15/20 0613  LABPROT 15.2  INR 1.2   CMP     Component Value Date/Time   NA 137 08/15/2020 0032   K 4.1 08/15/2020 0032   CL 98 08/15/2020 0032   CO2 28 08/15/2020 0032   GLUCOSE 83 08/15/2020 0032   BUN 6 (L) 08/15/2020 0032   CREATININE 0.83 08/15/2020 0032   CALCIUM 9.0 08/15/2020 0032   PROT 7.1 08/10/2020 2020   ALBUMIN 3.3 (L) 08/10/2020 2020   AST 27 08/10/2020 2020   ALT 25 08/10/2020 2020   ALKPHOS 60 08/10/2020 2020   BILITOT 1.3 (H) 08/10/2020 2020   GFRNONAA >60 08/15/2020 0032   GFRAA >60 10/13/2019 0539   Lipase     Component Value Date/Time   LIPASE 15 08/10/2020 2020       Studies/Results: CT ABDOMEN PELVIS W CONTRAST  Result Date: 08/14/2020 CLINICAL DATA:   Follow-up diverticulitis. EXAM: CT ABDOMEN AND PELVIS WITH CONTRAST TECHNIQUE: Multidetector CT imaging of the abdomen and pelvis was performed using the standard protocol following bolus administration of intravenous contrast. CONTRAST:  121mL OMNIPAQUE IOHEXOL 300 MG/ML  SOLN COMPARISON:  CT 4 days ago 08/10/2020 FINDINGS: Lower chest: Small right pleural effusion has developed since prior exam. Adjacent compressive atelectasis. Minor left lower lobe atelectasis. There are coronary artery calcifications. Hepatobiliary: Enlarged liver spanning 25.2 cm cranial caudal with steatosis. Distended gallbladder. No calcified gallstone. No biliary dilatation. Pancreas: Punctate calcification in the pancreatic head. No ductal dilatation or inflammation. Spleen: Upper normal spanning 13.1 cm cranial caudal. No focal abnormality. Adrenals/Urinary Tract: Normal adrenal glands. No hydronephrosis or perinephric edema. Homogeneous renal enhancement with symmetric excretion on delayed phase imaging. 2.5 cm parapelvic cyst in the mid right kidney. 2.1 cm simple cyst in the upper left kidney. Urinary bladder is only minimally distended, primarily obscured by streak artifact from bilateral hip arthroplasties. Stomach/Bowel: Redundant sigmoid colon with perforated diverticulitis involving the proximal sigmoid in the pelvis just to the right of midline again demonstrated. There is a new pericolonic air fluid collection that measures 4.0 x 3.4 x 3.1 cm, series 3, image 75. Small fluid collection just anterior to the right iliac vessels and posterior to  the cecum measures 3.1 x 2.5 x 3.6 cm, series 3, image 68. Crescentic fluid collection in the right pericolic gutter abutting the inferior tip of the liver measures 6.7 x 2.0 x 2.2 cm, series 3, image 59. Additional ill-defined fluid and stranding in the pelvis and right pericolic gutter. Administered enteric contrast reaches the proximal transverse colon. There is no extraluminal  contrast, however contrast does not reach the site of perforation in the sigmoid. Multiple additional noninflamed diverticula without additional sites of acute diverticulitis. Small hiatal hernia. No small bowel obstruction, inflammation, or ileus. Vascular/Lymphatic: Multiple small lymph nodes scattered throughout both the central and distal mesentery. For example 6 mm node series 3, image 66 centrally. There multiple periportal and retroperitoneal nodes. No evidence of portal or mesenteric vein thrombosis. Aorto bi-iliac atherosclerosis. 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 Other: Pelvic and right side of fluid collections as described above. Foci of pneumoperitoneum in the upper abdomen have improved. There is a small fat containing umbilical hernia, with increased edema in the adjacent periumbilical fat. Musculoskeletal: Bilateral hip arthroplasties. There are no acute or suspicious osseous abnormalities. IMPRESSION: 1. Redemonstrated perforated sigmoid diverticulitis with new pericolonic and right-sided collections consistent with abscess. Three collections are noted in the pelvis and right pericolic gutter, largest inferior to the liver tip measures 6.7 x 2 x 2.0 cm. 2. Small right pleural effusion has developed since prior exam. 3. Hepatomegaly and hepatic steatosis. 4. Small fat containing umbilical hernia, with increased edema in the adjacent periumbilical fat. 5. Multiple small lymph nodes scattered throughout the central and distal mesentery, likely reactive. Aortic Atherosclerosis (ICD10-I70.0). Electronically Signed   By: Keith Rake M.D.   On: 08/14/2020 20:05    Anti-infectives: Anti-infectives (From admission, onward)   Start     Dose/Rate Route Frequency Ordered Stop   08/11/20 0600  piperacillin-tazobactam (ZOSYN) IVPB 3.375 g        3.375 g 12.5 mL/hr over 240 Minutes Intravenous Every 8 hours 08/10/20 2300      08/10/20 2130  piperacillin-tazobactam (ZOSYN) IVPB 3.375 g        3.375 g 100 mL/hr over 30 Minutes Intravenous  Once 08/10/20 2122 08/10/20 2149       Assessment/Plan OSA - on Cpap COPD - on O2 prn HTN - doesn't appear to be on meds at home.  BP acceptable currently.  Diverticulitis with microperforation -I personally reviewed the CT scan that was done overnight. There is an abscess adjacent to the sigmoid colon, as well as several very small collections, on adjacent to the liver. Given that the patient continues to have ongoing severe pain and these fluid collections will not all be drainable, we discussed proceeding with sigmoid colectomy vs percutaneous drainage of the abscess adjacent to the sigmoid colon. The advantage of surgery would be definitive treatment now, however the patient would have a colostomy. I discussed that we can try to drain the abscess next to his colon and if his symptoms improve he can hopefully have a sigmoid resection in the elective setting without a colostomy. He does not have diffuse peritonitis and is afebrile without tachycardia or hypotension, so I think we can try percutaneous drainage. I discussed that if his symptoms do not improve with drainage or he clinically worsens, he will require sigmoid colectomy with colostomy during this admission. -Continue to mobilize, patient is ambulating frequently. -Continue zosyn -NPO for procedure, IR consulted for percutaneous drain -VTE: lovenox -Dispo: inpatient  LOS: 5 days   Michaelle Birks, MD [facility] Surgery General, Hepatobiliary and Pancreatic Surgery 08/15/20 8:16 AM Please see Amion for pager number during day hours 7:00am-4:30pm or 7:00am -11:30am on weekends

## 2020-08-15 NOTE — Procedures (Signed)
Interventional Radiology Procedure Note  Procedure: CT guided diverticular abscess drain placement  Findings: Please refer to procedural dictation for full description.  RLQ 10.2 Fr pigtail drain placement in abscess.  10 mL purulent fluid aspirated, sent for culture.  Drain placed to bulb suction.  Complications: None immediate  Estimated Blood Loss: < 5 mL  Recommendations: Keep to bulb suction to now. Follow up cultures. IR will follow.   Ruthann Cancer, MD

## 2020-08-16 LAB — CBC
HCT: 36 % — ABNORMAL LOW (ref 39.0–52.0)
Hemoglobin: 11.7 g/dL — ABNORMAL LOW (ref 13.0–17.0)
MCH: 30.7 pg (ref 26.0–34.0)
MCHC: 32.5 g/dL (ref 30.0–36.0)
MCV: 94.5 fL (ref 80.0–100.0)
Platelets: 231 10*3/uL (ref 150–400)
RBC: 3.81 MIL/uL — ABNORMAL LOW (ref 4.22–5.81)
RDW: 13.1 % (ref 11.5–15.5)
WBC: 8.4 10*3/uL (ref 4.0–10.5)
nRBC: 0 % (ref 0.0–0.2)

## 2020-08-16 MED ORDER — IBUPROFEN 600 MG PO TABS
600.0000 mg | ORAL_TABLET | Freq: Three times a day (TID) | ORAL | Status: AC
  Administered 2020-08-16 – 2020-08-18 (×6): 600 mg via ORAL
  Filled 2020-08-16 (×6): qty 1

## 2020-08-16 MED ORDER — HYDRALAZINE HCL 20 MG/ML IJ SOLN: 10.0000 mg | INTRAMUSCULAR | Status: DC | PRN

## 2020-08-16 MED ORDER — DICLOFENAC SODIUM 1 % EX GEL
2.0000 g | Freq: Four times a day (QID) | CUTANEOUS | Status: DC
  Administered 2020-08-16 – 2020-08-22 (×21): 2 g via TOPICAL
  Filled 2020-08-16: qty 100

## 2020-08-16 NOTE — Progress Notes (Signed)
Referring Physician(s): Allen,S  Supervising Physician: Daryll Brod  Patient Status:  [facility] - In-pt  Chief Complaint:  Right lower abdominal/pelvic pain/abscess  Subjective: Pt cont to have some RLQ discomfort; no recent BM; denies fever,N/V   Allergies: Patient has no known allergies.  Medications: Prior to Admission medications   Medication Sig Start Date End Date Taking? Authorizing Provider  Fluticasone-Umeclidin-Vilant (TRELEGY ELLIPTA) 100-62.5-25 MCG/INH AEPB Inhale 1 puff into the lungs daily. 08/16/19  Yes Icard, Bradley L, DO  Ibuprofen 200 MG CAPS Take 1 capsule by mouth daily as needed (For pain).   Yes [provider]  lidocaine (LIDODERM) 5 % Place 1 patch onto the skin daily as needed (For back pain). Remove & Discard patch within 12 hours or as directed by MD   Yes [provider]  predniSONE (STERAPRED UNI-PAK 21 TAB) 10 MG (21) TBPK tablet Take by mouth daily. Take 6 tabs by mouth daily  for 2 days, then 5 tabs for 2 days, then 4 tabs for 2 days, then 3 tabs for 2 days, 2 tabs for 2 days, then 1 tab by mouth daily for 2 days Patient not taking: No sig reported 05/23/20   Stacey Drain, [place], PA-C  albuterol (PROVENTIL) (2.5 MG/3ML) 0.083% nebulizer solution Take 3 mLs (2.5 mg total) by nebulization every 6 (six) hours as needed for wheezing or shortness of breath. Patient not taking: Reported on 03/14/2020 03/22/18 05/23/20  Garner Nash, DO     Vital Signs: BP (!) 157/91 (BP Location: Left Arm)   Pulse 74   Temp 98 F (36.7 C)   Resp 18   Ht 6\' 4"  (1.93 m)   Wt 229 lb 15 oz (104.3 kg)   SpO2 91%   BMI 27.99 kg/m   Physical Exam awake/alert; RLQ drain intact, insertion site ok, mild -mod tender, no leakage,  drain placed back into statlock (was out on exam); drain flushed without difficulty, feculent appearing fluid in JP bulb, OP 10-15 cc  Imaging: CT ABDOMEN PELVIS W CONTRAST  Result Date: 08/14/2020 CLINICAL DATA:  Follow-up  diverticulitis. EXAM: CT ABDOMEN AND PELVIS WITH CONTRAST TECHNIQUE: Multidetector CT imaging of the abdomen and pelvis was performed using the standard protocol following bolus administration of intravenous contrast. CONTRAST:  164mL OMNIPAQUE IOHEXOL 300 MG/ML  SOLN COMPARISON:  CT 4 days ago 08/10/2020 FINDINGS: Lower chest: Small right pleural effusion has developed since prior exam. Adjacent compressive atelectasis. Minor left lower lobe atelectasis. There are coronary artery calcifications. Hepatobiliary: Enlarged liver spanning 25.2 cm cranial caudal with steatosis. Distended gallbladder. No calcified gallstone. No biliary dilatation. Pancreas: Punctate calcification in the pancreatic head. No ductal dilatation or inflammation. Spleen: Upper normal spanning 13.1 cm cranial caudal. No focal abnormality. Adrenals/Urinary Tract: Normal adrenal glands. No hydronephrosis or perinephric edema. Homogeneous renal enhancement with symmetric excretion on delayed phase imaging. 2.5 cm parapelvic cyst in the mid right kidney. 2.1 cm simple cyst in the upper left kidney. Urinary bladder is only minimally distended, primarily obscured by streak artifact from bilateral hip arthroplasties. Stomach/Bowel: Redundant sigmoid colon with perforated diverticulitis involving the proximal sigmoid in the pelvis just to the right of midline again demonstrated. There is a new pericolonic air fluid collection that measures 4.0 x 3.4 x 3.1 cm, series 3, image 75. Small fluid collection just anterior to the right iliac vessels and posterior to the cecum measures 3.1 x 2.5 x 3.6 cm, series 3, image 68. Crescentic fluid collection in the right pericolic gutter abutting  the inferior tip of the liver measures 6.7 x 2.0 x 2.2 cm, series 3, image 59. Additional ill-defined fluid and stranding in the pelvis and right pericolic gutter. Administered enteric contrast reaches the proximal transverse colon. There is no extraluminal contrast, however  contrast does not reach the site of perforation in the sigmoid. Multiple additional noninflamed diverticula without additional sites of acute diverticulitis. Small hiatal hernia. No small bowel obstruction, inflammation, or ileus. Vascular/Lymphatic: Multiple small lymph nodes scattered throughout both the central and distal mesentery. For example 6 mm node series 3, image 66 centrally. There multiple periportal and retroperitoneal nodes. No evidence of portal or mesenteric vein thrombosis. Aorto bi-iliac atherosclerosis. 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 Other: Pelvic and right side of fluid collections as described above. Foci of pneumoperitoneum in the upper abdomen have improved. There is a small fat containing umbilical hernia, with increased edema in the adjacent periumbilical fat. Musculoskeletal: Bilateral hip arthroplasties. There are no acute or suspicious osseous abnormalities. IMPRESSION: 1. Redemonstrated perforated sigmoid diverticulitis with new pericolonic and right-sided collections consistent with abscess. Three collections are noted in the pelvis and right pericolic gutter, largest inferior to the liver tip measures 6.7 x 2 x 2.0 cm. 2. Small right pleural effusion has developed since prior exam. 3. Hepatomegaly and hepatic steatosis. 4. Small fat containing umbilical hernia, with increased edema in the adjacent periumbilical fat. 5. Multiple small lymph nodes scattered throughout the central and distal mesentery, likely reactive. Aortic Atherosclerosis (ICD10-I70.0). Electronically Signed   By: Keith Rake M.D.   On: 08/14/2020 20:05   CT IMAGE GUIDED DRAINAGE BY PERCUTANEOUS CATHETER  Result Date: 08/15/2020 INDICATION: 65 year old male with history of acute diverticulitis complicated by abscess formation in the right lower quadrant. EXAM: CT IMAGE GUIDED DRAINAGE BY PERCUTANEOUS CATHETER COMPARISON:  CT  abdomen pelvis from 08/14/2020 MEDICATIONS: The patient is currently admitted to the hospital and receiving intravenous antibiotics. The antibiotics were administered within an appropriate time frame prior to the initiation of the procedure. ANESTHESIA/SEDATION: Moderate (conscious) sedation was employed during this procedure. A total of Versed 1 mg and Fentanyl 25 mcg was administered intravenously. Moderate Sedation Time: 14 minutes. The patient's level of consciousness and vital signs were monitored continuously by radiology nursing throughout the procedure under my direct supervision. CONTRAST:  None COMPLICATIONS: None immediate. PROCEDURE: Informed written consent was obtained from the patient after a discussion of the risks, benefits and alternatives to treatment. The patient was placed supine on the CT gantry and a pre procedural CT was performed re-demonstrating the known abscess/fluid collection within the right lower quadrant. The procedure was planned. A timeout was performed prior to the initiation of the procedure. The right lower quadrant was prepped and draped in the usual sterile fashion. The overlying soft tissues were anesthetized with 1% lidocaine with epinephrine. Appropriate trajectory was planned with the use of a 22 gauge spinal needle. An 18 gauge trocar needle was advanced into the abscess/fluid collection and a short Amplatz super stiff wire was coiled within the collection. Appropriate positioning was confirmed with a limited CT scan. The tract was serially dilated allowing placement of a 10.2 [place] all-purpose drainage catheter. Appropriate positioning was confirmed with a limited postprocedural CT scan. Approximately 10 ml of purulent fluid was aspirated. The tube was connected to a bulb suction and sutured in place. A dressing was placed. The patient tolerated the procedure well without immediate post procedural complication. IMPRESSION: Successful CT guided placement of  a 10.2 [place]  all purpose drain catheter into the right lower quadrant diverticular abscess with aspiration of approximately 10 mL of purulent fluid. Samples were sent to the laboratory as requested by the ordering clinical team. Ruthann Cancer, MD [facility] Radiology Electronically Signed   By: Ruthann Cancer MD   On: 08/15/2020 17:36    Labs:  CBC: Recent Labs    08/12/20 0048 08/13/20 0048 08/15/20 0032 08/16/20 0721  WBC 11.5* 8.5 6.9 8.4  HGB 11.4* 10.4* 12.2* 11.7*  HCT 34.7* 32.7* 38.0* 36.0*  PLT 160 151 210 231    COAGS: Recent Labs    08/15/20 0613  INR 1.2    BMP: Recent Labs    10/10/19 0544 10/11/19 0537 10/12/19 0434 10/13/19 0539 08/10/20 2020 08/11/20 0049 08/12/20 0048 08/13/20 0048 08/15/20 0032  NA 139 135 135 136   < > 135 135 134* 137  K 4.1 3.8 3.5 3.5   < > 3.8 3.7 3.5 4.1  CL 99 100 101 103   < > 100 102 100 98  CO2 26 26 26 27    < > 24 26 26 28   GLUCOSE 151* 137* 130* 112*   < > 151* 120* 93 83  BUN 16 17 13 13    < > 11 11 12  6*  CALCIUM 9.3 8.5* 8.5* 8.1*   < > 8.4* 8.3* 8.3* 9.0  CREATININE 0.94 0.81 0.78 0.74   < > 0.80 0.87 0.85 0.83  GFRNONAA >60 >60 >60 >60   < > >60 >60 >60 >60  GFRAA >60 >60 >60 >60  --   --   --   --   --    < > = values in this interval not displayed.    LIVER FUNCTION TESTS: Recent Labs    10/10/19 0544 10/11/19 0537 10/13/19 0539 08/10/20 2020  BILITOT 0.8 1.2 1.0 1.3*  AST 28 30 22 27   ALT 26 19 17 25   ALKPHOS 79 56 49 60  PROT 7.8 6.4* 6.1* 7.1  ALBUMIN 4.3 3.4* 2.8* 3.3*    Assessment and Plan: Pt with hx right diverticular abscess drain placement on 08/15/20; afebrile; WBC nl; hgb stable; drain fl cx- e coli; cont with drain irrigation/OP and lab monitoring; OOB as tolerated; obtain f/u CT if clinical status worsens or once drain OP minimal over a 3-4 day span; will need drain injection before considering removal; other plans as per [facility]   Electronically  Signed: D. Rowe Robert, PA-C 08/16/2020, 10:58 AM   I spent a total of 15 minutes at the the patient's bedside AND on the patient's hospital floor or unit, greater than 50% of which was counseling/coordinating care for right lower abdominal abscess drain    Patient ID: [name], male   DOB: [birth year], 65 y.o.   MRN: [id number]

## 2020-08-16 NOTE — Progress Notes (Signed)
Completed pt teaching on drain care with pt (he asked how to do it in case he ends up going home with it). He wanted to try to empty drain with supervision. Pt successfully emptied drain into measuring container using proper hand hygiene.  Drain put out 11 mL during day shift. Irrigated with total of 10 mL (1 mL had come out by end of shift).  Pt unable to put pressure on right foot or ambulate at shift change. Pt had taken PRN pain medications, scheduled advil, applied diclofen gel and ice packs, elevated foot on pillows. Pain in foot is now greater than pain in abdomen. Pt concerned that he is no longer able to ambulate in hallways.  Pt ambulated in hallway three times before 1330. He completed 3 laps each time of ambulation.  Alternating morphine and oxycodone, in addition to robaxin q6h, helps pt maintain a steady level of somewhat tolerable pain management.

## 2020-08-16 NOTE — Progress Notes (Signed)
Subjective: Urinary frequency but voiding a lot per patient, only 1L documented for 24 hrs.  Denies pneumaturia or stool present in urine.  Still having significant RLQ pain despite drain placement yesterday.  Still no BM in 7 days.  No nausea.  Tolerating CLD  ROS: See above, otherwise other systems negative  Objective: Vital signs in last 24 hours: Temp:  [97.8 F (36.6 C)-98.2 F (36.8 C)] 98 F (36.7 C) (01/13 0439) Pulse Rate:  [70-79] 74 (01/13 0439) Resp:  [16-21] 18 (01/13 0439) BP: (141-163)/(81-99) 157/91 (01/13 0439) SpO2:  [91 %-100 %] 91 % (01/13 0439) Last BM Date: 08/10/20  Intake/Output from previous day: 01/12 0701 - 01/13 0700 In: 300 [P.O.:240; IV Piggyback:50] Out: 1110 [Urine:1100; Drains:10] Intake/Output this shift: No intake/output data recorded.  PE: Heart: regular Lungs: CTAB Abd: soft, tender across lower abdomen, but no guarding or peritonitis, greatest in RLQ, +BS, erythema resolved in RLQ, drain with chocolate milk looking output, somewhat feculent appearing  Lab Results:  Recent Labs    08/15/20 0032 08/16/20 0721  WBC 6.9 8.4  HGB 12.2* 11.7*  HCT 38.0* 36.0*  PLT 210 231   BMET Recent Labs    08/15/20 0032  NA 137  K 4.1  CL 98  CO2 28  GLUCOSE 83  BUN 6*  CREATININE 0.83  CALCIUM 9.0   PT/INR Recent Labs    08/15/20 0613  LABPROT 15.2  INR 1.2   CMP     Component Value Date/Time   NA 137 08/15/2020 0032   K 4.1 08/15/2020 0032   CL 98 08/15/2020 0032   CO2 28 08/15/2020 0032   GLUCOSE 83 08/15/2020 0032   BUN 6 (L) 08/15/2020 0032   CREATININE 0.83 08/15/2020 0032   CALCIUM 9.0 08/15/2020 0032   PROT 7.1 08/10/2020 2020   ALBUMIN 3.3 (L) 08/10/2020 2020   AST 27 08/10/2020 2020   ALT 25 08/10/2020 2020   ALKPHOS 60 08/10/2020 2020   BILITOT 1.3 (H) 08/10/2020 2020   GFRNONAA >60 08/15/2020 0032   GFRAA >60 10/13/2019 0539   Lipase     Component Value Date/Time   LIPASE 15 08/10/2020 2020        Studies/Results: CT ABDOMEN PELVIS W CONTRAST  Result Date: 08/14/2020 CLINICAL DATA:  Follow-up diverticulitis. EXAM: CT ABDOMEN AND PELVIS WITH CONTRAST TECHNIQUE: Multidetector CT imaging of the abdomen and pelvis was performed using the standard protocol following bolus administration of intravenous contrast. CONTRAST:  163mL OMNIPAQUE IOHEXOL 300 MG/ML  SOLN COMPARISON:  CT 4 days ago 08/10/2020 FINDINGS: Lower chest: Small right pleural effusion has developed since prior exam. Adjacent compressive atelectasis. Minor left lower lobe atelectasis. There are coronary artery calcifications. Hepatobiliary: Enlarged liver spanning 25.2 cm cranial caudal with steatosis. Distended gallbladder. No calcified gallstone. No biliary dilatation. Pancreas: Punctate calcification in the pancreatic head. No ductal dilatation or inflammation. Spleen: Upper normal spanning 13.1 cm cranial caudal. No focal abnormality. Adrenals/Urinary Tract: Normal adrenal glands. No hydronephrosis or perinephric edema. Homogeneous renal enhancement with symmetric excretion on delayed phase imaging. 2.5 cm parapelvic cyst in the mid right kidney. 2.1 cm simple cyst in the upper left kidney. Urinary bladder is only minimally distended, primarily obscured by streak artifact from bilateral hip arthroplasties. Stomach/Bowel: Redundant sigmoid colon with perforated diverticulitis involving the proximal sigmoid in the pelvis just to the right of midline again demonstrated. There is a new pericolonic air fluid collection that measures 4.0 x 3.4 x 3.1 cm,  series 3, image 75. Small fluid collection just anterior to the right iliac vessels and posterior to the cecum measures 3.1 x 2.5 x 3.6 cm, series 3, image 68. Crescentic fluid collection in the right pericolic gutter abutting the inferior tip of the liver measures 6.7 x 2.0 x 2.2 cm, series 3, image 59. Additional ill-defined fluid and stranding in the pelvis and right pericolic gutter.  Administered enteric contrast reaches the proximal transverse colon. There is no extraluminal contrast, however contrast does not reach the site of perforation in the sigmoid. Multiple additional noninflamed diverticula without additional sites of acute diverticulitis. Small hiatal hernia. No small bowel obstruction, inflammation, or ileus. Vascular/Lymphatic: Multiple small lymph nodes scattered throughout both the central and distal mesentery. For example 6 mm node series 3, image 66 centrally. There multiple periportal and retroperitoneal nodes. No evidence of portal or mesenteric vein thrombosis. Aorto bi-iliac atherosclerosis. 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 Other: Pelvic and right side of fluid collections as described above. Foci of pneumoperitoneum in the upper abdomen have improved. There is a small fat containing umbilical hernia, with increased edema in the adjacent periumbilical fat. Musculoskeletal: Bilateral hip arthroplasties. There are no acute or suspicious osseous abnormalities. IMPRESSION: 1. Redemonstrated perforated sigmoid diverticulitis with new pericolonic and right-sided collections consistent with abscess. Three collections are noted in the pelvis and right pericolic gutter, largest inferior to the liver tip measures 6.7 x 2 x 2.0 cm. 2. Small right pleural effusion has developed since prior exam. 3. Hepatomegaly and hepatic steatosis. 4. Small fat containing umbilical hernia, with increased edema in the adjacent periumbilical fat. 5. Multiple small lymph nodes scattered throughout the central and distal mesentery, likely reactive. Aortic Atherosclerosis (ICD10-I70.0). Electronically Signed   By: Keith Rake M.D.   On: 08/14/2020 20:05   CT IMAGE GUIDED DRAINAGE BY PERCUTANEOUS CATHETER  Result Date: 08/15/2020 INDICATION: 64 year old male with history of acute diverticulitis complicated by abscess  formation in the right lower quadrant. EXAM: CT IMAGE GUIDED DRAINAGE BY PERCUTANEOUS CATHETER COMPARISON:  CT abdomen pelvis from 08/14/2020 MEDICATIONS: The patient is currently admitted to the hospital and receiving intravenous antibiotics. The antibiotics were administered within an appropriate time frame prior to the initiation of the procedure. ANESTHESIA/SEDATION: Moderate (conscious) sedation was employed during this procedure. A total of Versed 1 mg and Fentanyl 25 mcg was administered intravenously. Moderate Sedation Time: 14 minutes. The patient's level of consciousness and vital signs were monitored continuously by radiology nursing throughout the procedure under my direct supervision. CONTRAST:  None COMPLICATIONS: None immediate. PROCEDURE: Informed written consent was obtained from the patient after a discussion of the risks, benefits and alternatives to treatment. The patient was placed supine on the CT gantry and a pre procedural CT was performed re-demonstrating the known abscess/fluid collection within the right lower quadrant. The procedure was planned. A timeout was performed prior to the initiation of the procedure. The right lower quadrant was prepped and draped in the usual sterile fashion. The overlying soft tissues were anesthetized with 1% lidocaine with epinephrine. Appropriate trajectory was planned with the use of a 22 gauge spinal needle. An 18 gauge trocar needle was advanced into the abscess/fluid collection and a short Amplatz super stiff wire was coiled within the collection. Appropriate positioning was confirmed with a limited CT scan. The tract was serially dilated allowing placement of a 10.2 [place] all-purpose drainage catheter. Appropriate positioning was confirmed with a limited postprocedural CT scan. Approximately 10  ml of purulent fluid was aspirated. The tube was connected to a bulb suction and sutured in place. A dressing was placed. The patient tolerated the procedure  well without immediate post procedural complication. IMPRESSION: Successful CT guided placement of a 10.2 [place] all purpose drain catheter into the right lower quadrant diverticular abscess with aspiration of approximately 10 mL of purulent fluid. Samples were sent to the laboratory as requested by the ordering clinical team. Ruthann Cancer, MD [facility] Electronically Signed   By: Ruthann Cancer MD   On: 08/15/2020 17:36    Anti-infectives: Anti-infectives (From admission, onward)   Start     Dose/Rate Route Frequency Ordered Stop   08/11/20 0600  piperacillin-tazobactam (ZOSYN) IVPB 3.375 g        3.375 g 12.5 mL/hr over 240 Minutes Intravenous Every 8 hours 08/10/20 2300     08/10/20 2130  piperacillin-tazobactam (ZOSYN) IVPB 3.375 g        3.375 g 100 mL/hr over 30 Minutes Intravenous  Once 08/10/20 2122 08/10/20 2149       Assessment/Plan OSA - on Cpap COPD - on O2 prn HTN - prns ordered  Diverticulitis with microperforation and abscess -s/p IR perc drain on 1/12.  Output is chocolate milk appearing.  This is somewhat concerning for looking like feculent output, but too soon to tell. -pain persists and is currently not any better. -WBC still normal today and AF.  No urgent needs for surgery, but if fails conservative management, will likely need Hartman's while here. -mobilize/pulm toilet -cont IV zosyn  FEN - IVFs, decrease rate today/CLD VTE - lovenox ID - zosyn   LOS: 6 days    Henreitta Cea , [facility] Surgery 08/16/2020, 8:59 AM Please see Amion for pager number during day hours 7:00am-4:30pm or 7:00am -11:30am on weekends

## 2020-08-16 NOTE — Progress Notes (Signed)
Pt able to place himself on CPAP dream station for the night without assistance. RT will continue to monitor.

## 2020-08-16 NOTE — Progress Notes (Signed)
Pt reported new onset of pain in right foot (near arch/heel). Pt reported he has had gout flare-ups in the past. Takes prednisone at home sometimes for gout. Notified physician.

## 2020-08-17 ENCOUNTER — Encounter (HOSPITAL_COMMUNITY): Payer: Self-pay | Admitting: General Surgery

## 2020-08-17 ENCOUNTER — Inpatient Hospital Stay (HOSPITAL_COMMUNITY): Payer: 59

## 2020-08-17 DIAGNOSIS — R609 Edema, unspecified: Secondary | ICD-10-CM

## 2020-08-17 DIAGNOSIS — M79605 Pain in left leg: Secondary | ICD-10-CM | POA: Diagnosis not present

## 2020-08-17 MED ORDER — ENSURE MAX PROTEIN PO LIQD
11.0000 [oz_av] | Freq: Every day | ORAL | Status: DC
  Administered 2020-08-17 – 2020-08-22 (×5): 11 [oz_av] via ORAL
  Filled 2020-08-17 (×6): qty 330

## 2020-08-17 MED ORDER — METHOCARBAMOL 750 MG PO TABS
750.0000 mg | ORAL_TABLET | Freq: Four times a day (QID) | ORAL | Status: DC | PRN
  Administered 2020-08-18 – 2020-08-19 (×4): 750 mg via ORAL
  Filled 2020-08-17 (×4): qty 1

## 2020-08-17 MED ORDER — GABAPENTIN 100 MG PO CAPS
200.0000 mg | ORAL_CAPSULE | Freq: Two times a day (BID) | ORAL | Status: DC
  Administered 2020-08-17 – 2020-08-22 (×11): 200 mg via ORAL
  Filled 2020-08-17 (×11): qty 2

## 2020-08-17 MED ORDER — BOOST / RESOURCE BREEZE PO LIQD CUSTOM
1.0000 | Freq: Three times a day (TID) | ORAL | Status: DC
  Administered 2020-08-17: 1 via ORAL

## 2020-08-17 MED ORDER — ADULT MULTIVITAMIN W/MINERALS CH
1.0000 | ORAL_TABLET | Freq: Every day | ORAL | Status: DC
  Administered 2020-08-17 – 2020-08-22 (×6): 1 via ORAL
  Filled 2020-08-17 (×6): qty 1

## 2020-08-17 MED ORDER — IBUPROFEN 600 MG PO TABS: 600.0000 mg | ORAL_TABLET | Freq: Three times a day (TID) | ORAL | Status: DC | PRN

## 2020-08-17 MED ORDER — DOCUSATE SODIUM 100 MG PO CAPS
100.0000 mg | ORAL_CAPSULE | Freq: Every day | ORAL | Status: DC
  Administered 2020-08-17 – 2020-08-22 (×6): 100 mg via ORAL
  Filled 2020-08-17 (×6): qty 1

## 2020-08-17 MED ORDER — SODIUM CHLORIDE 0.9 % IV SOLN
2.0000 g | Freq: Three times a day (TID) | INTRAVENOUS | Status: DC
  Administered 2020-08-17 – 2020-08-21 (×12): 2 g via INTRAVENOUS
  Filled 2020-08-17 (×12): qty 2

## 2020-08-17 MED ORDER — ACETAMINOPHEN 500 MG PO TABS
1000.0000 mg | ORAL_TABLET | Freq: Four times a day (QID) | ORAL | Status: DC
  Administered 2020-08-17 – 2020-08-22 (×21): 1000 mg via ORAL
  Filled 2020-08-17 (×22): qty 2

## 2020-08-17 NOTE — Progress Notes (Addendum)
Subjective: Patient having significant pain issues still with his abdomen as well as his right foot.  He was ambulating well yesterday and sat down and then the next time he stood, he had an acute on set of pain in his right foot on the plantar aspect around his arch.  He complains of some edema of this foot, but states he has chronic edema of his RLE.  After much discussion it is determined he has a history of RLE DVT in the past and follows up with hematology every 6 months as he refuses anticoagulation.  His daughter has a dx of Factor V Leidin's deficiency.  He states he does not have this that he knows of.  He has a history of gout, but only in his great toes, never in his foot like this.  He was started on scheduled ibuprofen yesterday as well as voltaren gel which has had some relief, but not significant.  Tolerating clear liquids.  No BM   ROS: See above, otherwise other systems negative  Objective: Vital signs in last 24 hours: Temp:  [97.7 F (36.5 C)-98.1 F (36.7 C)] 98.1 F (36.7 C) (01/14 0439) Pulse Rate:  [84-87] 87 (01/14 0439) Resp:  [18] 18 (01/14 0439) BP: (125-128)/(73-75) 128/74 (01/14 0439) SpO2:  [95 %-96 %] 96 % (01/14 0439) Last BM Date:  (PTA)  Intake/Output from previous day: 01/13 0701 - 01/14 0700 In: 15  Out: 2321 [Urine:2300; Drains:21] Intake/Output this shift: No intake/output data recorded.  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  Multiple flecks of stool present in bulb.   Ext: RLQ with some edema present in calf down to ankle and right foot.  His calf is nontender as is his ankle.  His foot is very tender along the mid foot and on the plantar surface.  Is it not overtly erythematous, but had some dependent rubor when standing which improved when he  laid down.  +2 DP pulse on right foot.  Chronic venous stasis changes noted on BLEs.  Full ROM of his toes with no pain, no focus of erythema or pain over a specific joint. Neuro: sensation intact throughout Psych: A&Ox3  Lab Results:  Recent Labs    08/15/20 0032 08/16/20 0721  WBC 6.9 8.4  HGB 12.2* 11.7*  HCT 38.0* 36.0*  PLT 210 231   BMET Recent Labs    08/15/20 0032  NA 137  K 4.1  CL 98  CO2 28  GLUCOSE 83  BUN 6*  CREATININE 0.83  CALCIUM 9.0   PT/INR Recent Labs    08/15/20 0613  LABPROT 15.2  INR 1.2   CMP     Component Value Date/Time   NA 137 08/15/2020 0032   K 4.1 08/15/2020 0032   CL 98 08/15/2020 0032   CO2 28 08/15/2020 0032   GLUCOSE 83 08/15/2020 0032   BUN 6 (L) 08/15/2020 0032   CREATININE 0.83 08/15/2020 0032   CALCIUM 9.0 08/15/2020 0032   PROT 7.1 08/10/2020 2020   ALBUMIN 3.3 (L) 08/10/2020 2020   AST 27 08/10/2020 2020   ALT 25 08/10/2020 2020   ALKPHOS 60 08/10/2020 2020   BILITOT 1.3 (H) 08/10/2020 2020   GFRNONAA >60 08/15/2020 0032   GFRAA >60 10/13/2019 0539   Lipase  Component Value Date/Time   LIPASE 15 08/10/2020 2020       Studies/Results: CT IMAGE GUIDED DRAINAGE BY PERCUTANEOUS CATHETER  Result Date: 08/15/2020 INDICATION: 65 year old male with history of acute diverticulitis complicated by abscess formation in the right lower quadrant. EXAM: CT IMAGE GUIDED DRAINAGE BY PERCUTANEOUS CATHETER COMPARISON:  CT abdomen pelvis from 08/14/2020 MEDICATIONS: The patient is currently admitted to the hospital and receiving intravenous antibiotics. The antibiotics were administered within an appropriate time frame prior to the initiation of the procedure. ANESTHESIA/SEDATION: Moderate (conscious) sedation was employed during this procedure. A total of Versed 1 mg and Fentanyl 25 mcg was administered intravenously. Moderate Sedation Time: 14 minutes. The patient's level of consciousness and vital signs were monitored  continuously by radiology nursing throughout the procedure under my direct supervision. CONTRAST:  None COMPLICATIONS: None immediate. PROCEDURE: Informed written consent was obtained from the patient after a discussion of the risks, benefits and alternatives to treatment. The patient was placed supine on the CT gantry and a pre procedural CT was performed re-demonstrating the known abscess/fluid collection within the right lower quadrant. The procedure was planned. A timeout was performed prior to the initiation of the procedure. The right lower quadrant was prepped and draped in the usual sterile fashion. The overlying soft tissues were anesthetized with 1% lidocaine with epinephrine. Appropriate trajectory was planned with the use of a 22 gauge spinal needle. An 18 gauge trocar needle was advanced into the abscess/fluid collection and a short Amplatz super stiff wire was coiled within the collection. Appropriate positioning was confirmed with a limited CT scan. The tract was serially dilated allowing placement of a 10.2 [place] all-purpose drainage catheter. Appropriate positioning was confirmed with a limited postprocedural CT scan. Approximately 10 ml of purulent fluid was aspirated. The tube was connected to a bulb suction and sutured in place. A dressing was placed. The patient tolerated the procedure well without immediate post procedural complication. IMPRESSION: Successful CT guided placement of a 10.2 [place] all purpose drain catheter into the right lower quadrant diverticular abscess with aspiration of approximately 10 mL of purulent fluid. Samples were sent to the laboratory as requested by the ordering clinical team. Ruthann Cancer, MD [facility]iology Electronically Signed   By: Ruthann Cancer MD   On: 08/15/2020 17:36    Anti-infectives: Anti-infectives (From admission, onward)   Start     Dose/Rate Route Frequency Ordered Stop   08/11/20 0600   piperacillin-tazobactam (ZOSYN) IVPB 3.375 g        3.375 g 12.5 mL/hr over 240 Minutes Intravenous Every 8 hours 08/10/20 2300     08/10/20 2130  piperacillin-tazobactam (ZOSYN) IVPB 3.375 g        3.375 g 100 mL/hr over 30 Minutes Intravenous  Once 08/10/20 2122 08/10/20 2149       Assessment/Plan OSA - on Cpap COPD - on O2 prn HTN - prns ordered 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  Will await diagnostic imaging.  May need ortho eval vs vascular except patient has a great pulse in his foot so doubt  this is anything acutely vascular unless has a DVT.  Ibuprofen and voltaren gel to help with pain for now.  Diverticulitis with microperforation and abscess -s/p IR perc drain on 1/12.  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 -WBC still normal today and AF.  No urgent needs for surgery, but if fails conservative management, will likely need Hartman's while here. -will try full liquids today, add shakes and supplements -culture shows E coli, sensitivities pending but cont zosyn for now -recheck labs in am.   -concerned if patient does not start to improve, he is going to need OR here. -mobilize/pulm toilet -cont IV zosyn  FEN - SLIV (great UOP), FLD VTE - lovenox ID - zosyn   ADDENDUM: E coli sensitivities are back and reveal significant resistance.  He is being switched to Cefepime.  It is confirmed the patient DOES have Factor V Leiden mutation per hematology's noted.  He is on Lovenox appropriately.  His plain x-rays are negative of his leg/foot.  His duplex shows a R popliteal DVT age indeterminate, but this  is likely chronic as this is the same location as his DVT several years ago and he has chronic edema of this leg c/w chronic occlusive disease. D/w ortho regarding his pain.  Despite the acuteness of his pain, it is possible this may be plantar fasciitis and so we have placed him in a Prafo boot.  This has actually helped his pain significant since this afternoon.  He is tolerating his Full liquids well so far.  I had a long discussion with the patient and his wife explaining everything that is going on so she would understand.  I drew pictures for them as well to explain some of this too.  They were very appreciative.  He did tell me he had a colonoscopy 3 years ago with a large amount of polyps, none supposedly pre-malignant, but he has been requested to have a repeat c-scope in 3 years, which would be this year.  Continue current management with hopes to get him better with conservative therapy, but if he fails, he is aware he will likely need a Hartman's vs repeat CT scan and more drains if possible.  LOS: 7 days    Henreitta Cea , [facility] Surgery 08/17/2020, 9:30 AM Please see Amion for pager number during day hours 7:00am-4:30pm or 7:00am -11:30am on weekends

## 2020-08-17 NOTE — Progress Notes (Signed)
RT NOTE:  Pt able to place himself on CPAP dream station for the night without assistance. RT will continue to monitor. 

## 2020-08-17 NOTE — Progress Notes (Signed)
Lower extremity venous has been completed.   Preliminary results in CV Proc.   Abram Sander 08/17/2020 9:51 AM

## 2020-08-17 NOTE — Progress Notes (Signed)
Initial Nutrition Assessment  DOCUMENTATION CODES:   Not applicable  INTERVENTION:    Ensure Max po BID, each supplement provides 150 kcal and 30 grams of protein.   MVI with minerals daily.  NUTRITION DIAGNOSIS:   Increased nutrient needs related to acute illness (diverticular abscess) as evidenced by estimated needs.  GOAL:   Patient will meet greater than or equal to 90% of their needs  MONITOR:   PO intake,Supplement acceptance,Diet advancement,Labs  REASON FOR ASSESSMENT:   NPO/Clear Liquid Diet    ASSESSMENT:   65 yo male admitted with sigmoid diverticulitis with microperforation in the RLQ. PMH includes HTN, GERD, COPD, dysphagia, esophageal stricture, DDD, OSA, diverticulosis.   S/P diverticular abscess drain placement 1/12.  Drain output is feculent.  Patient was NPO 1/08 and 1/09, advanced to clear liquids 1/10. Just advanced to full liquids this morning.   He typically has a good appetite and eats well at home. Since admission, he has had minimal intake.  He c/o abdominal pain, but it is not exacerbated by eating. His wife brought in some Ensure Max supplements that he drinks at home. They agreed to receive the supplement from the hospital so wife does not have to bring it in.   Usual weights reviewed. Weight was stable PTA. No new weight since admission weight of 104.3 kg, which was likely carried over from previous weight on 05/23/20.  Labs reviewed.  Medications reviewed and include Colace.   NUTRITION - FOCUSED PHYSICAL EXAM:  Flowsheet Row Most Recent Value  Orbital Region No depletion  Upper Arm Region No depletion  Thoracic and Lumbar Region No depletion  Buccal Region No depletion  Temple Region No depletion  Clavicle Bone Region No depletion  Clavicle and Acromion Bone Region No depletion  Scapular Bone Region No depletion  Dorsal Hand No depletion  Patellar Region No depletion  Anterior Thigh Region No depletion  Posterior Calf  Region No depletion  Edema (RD Assessment) None  Hair Reviewed  Eyes Reviewed  Mouth Reviewed  Skin Reviewed  Nails Reviewed       Diet Order:   Diet Order            Diet full liquid Room service appropriate? Yes; Fluid consistency: Thin  Diet effective now                 EDUCATION NEEDS:   Education needs have been addressed (Discussed importance of adequate protein and calorie intake to support healing.)  Skin:  Skin Assessment: Reviewed RN Assessment  Last BM:  PTA  Height:   Ht Readings from Last 1 Encounters:  08/10/20 6\' 4"  (1.93 m)    Weight:   Wt Readings from Last 1 Encounters:  08/10/20 104.3 kg    Ideal Body Weight:  91.8 kg  BMI:  Body mass index is 27.99 kg/m.  Estimated Nutritional Needs:   Kcal:  2400-2600  Protein:  120-140 gm  Fluid:  2.4-2.6 L    Lucas Mallow, RD, LDN, CNSC Please refer to Amion for contact information.

## 2020-08-17 NOTE — Progress Notes (Signed)
Orthopedic Tech Progress Note Patient Details:  [name] 1956/07/22 [id number]  Ortho Devices Type of Ortho Device: Prafo boot/shoe Ortho Device/Splint Location: Patient Right Ortho Device/Splint Interventions: Ordered,Application,Adjustment   Post Interventions Patient Tolerated: Well Instructions Provided: Care of device,Adjustment of device   Rosana Hoes 08/17/2020, 1:39 PM

## 2020-08-18 LAB — AEROBIC/ANAEROBIC CULTURE W GRAM STAIN (SURGICAL/DEEP WOUND)

## 2020-08-18 LAB — BASIC METABOLIC PANEL
Anion gap: 8 (ref 5–15)
BUN: 8 mg/dL (ref 8–23)
CO2: 27 mmol/L (ref 22–32)
Calcium: 8.8 mg/dL — ABNORMAL LOW (ref 8.9–10.3)
Chloride: 107 mmol/L (ref 98–111)
Creatinine, Ser: 0.84 mg/dL (ref 0.61–1.24)
GFR, Estimated: >60 mL/min (ref >60)
Glucose, Bld: 110 mg/dL — ABNORMAL HIGH (ref 70–99)
Potassium: 3.8 mmol/L (ref 3.5–5.1)
Sodium: 142 mmol/L (ref 135–145)

## 2020-08-18 LAB — CBC
HCT: 34.7 % — ABNORMAL LOW (ref 39.0–52.0)
Hemoglobin: 11.2 g/dL — ABNORMAL LOW (ref 13.0–17.0)
MCH: 31.5 pg (ref 26.0–34.0)
MCHC: 32.3 g/dL (ref 30.0–36.0)
MCV: 97.5 fL (ref 80.0–100.0)
Platelets: 252 10*3/uL (ref 150–400)
RBC: 3.56 MIL/uL — ABNORMAL LOW (ref 4.22–5.81)
RDW: 13.1 % (ref 11.5–15.5)
WBC: 8.1 10*3/uL (ref 4.0–10.5)
nRBC: 0 % (ref 0.0–0.2)

## 2020-08-18 NOTE — Progress Notes (Signed)
RT NOTE:  Pt able to place himself on CPAP dream station for the night without assistance. RT will continue to monitor.

## 2020-08-18 NOTE — Progress Notes (Signed)
Subjective/Chief Complaint: Still having some RLQ pain. Tolerating fulls   Objective: Vital signs in last 24 hours: Temp:  [97.8 F (36.6 C)-98.6 F (37 C)] 97.8 F (36.6 C) (01/15 0451) Pulse Rate:  [67-72] 67 (01/15 0451) Resp:  [20] 20 (01/15 0451) BP: (126-147)/(79-90) 126/84 (01/15 0451) SpO2:  [92 %-98 %] 92 % (01/15 0451) Last BM Date:  (PTA)  Intake/Output from previous day: 01/14 0701 - 01/15 0700 In: 1387 [P.O.:960; IV Piggyback:427] Out: 10 [Drains:10] Intake/Output this shift: No intake/output data recorded.  General appearance: alert and cooperative Resp: clear to auscultation bilaterally Cardio: regular rate and rhythm GI: soft, moderate RLQ pain. drain output feculent  Lab Results:  Recent Labs    08/16/20 0721 08/18/20 0110  WBC 8.4 8.1  HGB 11.7* 11.2*  HCT 36.0* 34.7*  PLT 231 252   BMET Recent Labs    08/18/20 0110  NA 142  K 3.8  CL 107  CO2 27  GLUCOSE 110*  BUN 8  CREATININE 0.84  CALCIUM 8.8*   PT/INR No results for input(s): LABPROT, INR in the last 72 hours. ABG No results for input(s): PHART, HCO3 in the last 72 hours.  Invalid input(s): PCO2, PO2  Studies/Results: DG Tibia/Fibula Right  Result Date: 08/17/2020 CLINICAL DATA:  65 year old male with right lower extremity pain swelling and redness. EXAM: RIGHT TIBIA AND FIBULA - 2 VIEW COMPARISON:  Right foot series today. FINDINGS: Visible osseous structures are intact. Preserved alignment at the right knee with mild degenerative changes. No knee joint effusion. Evidence of widespread soft tissue swelling/stranding. No soft tissue gas. No radiopaque foreign body identified. IMPRESSION: Widespread soft tissue swelling/stranding. No acute osseous abnormality identified. Electronically Signed   By: Genevie Ann M.D.   On: 08/17/2020 10:13   DG Foot Complete Right  Result Date: 08/17/2020 CLINICAL DATA:  65 year old male with right lower extremity pain swelling and redness. EXAM:  RIGHT FOOT COMPLETE - 3+ VIEW COMPARISON:  No prior radiographs. FINDINGS: Evidence of generalized soft tissue swelling. No soft tissue gas. No radiopaque foreign body identified. Evidence of healed prior fracture at the base of the right 5th metatarsal. No acute osseous abnormality identified. IMPRESSION: Soft tissue swelling with no acute osseous abnormality identified. Electronically Signed   By: Genevie Ann M.D.   On: 08/17/2020 10:11   VAS [place] LOWER EXTREMITY VENOUS (DVT)  Result Date: 08/17/2020  Lower Venous DVT Study Indications: Pain, and Edema.  Comparison Study: no prior Performing Technologist: Abram Sander RVS  Examination Guidelines: A complete evaluation includes B-mode imaging, spectral Doppler, color Doppler, and power Doppler as needed of all accessible portions of each vessel. Bilateral testing is considered an integral part of a complete examination. Limited examinations for reoccurring indications may be performed as noted. The reflux portion of the exam is performed with the patient in reverse Trendelenburg.  +---------+---------------+---------+-----------+----------+-----------------+ RIGHT    CompressibilityPhasicitySpontaneityPropertiesThrombus Aging    +---------+---------------+---------+-----------+----------+-----------------+ CFV      Full           Yes      Yes                                    +---------+---------------+---------+-----------+----------+-----------------+ SFJ      Full                                                           +---------+---------------+---------+-----------+----------+-----------------+  FV Prox  Full                                                           +---------+---------------+---------+-----------+----------+-----------------+ FV Mid   Full                                                           +---------+---------------+---------+-----------+----------+-----------------+ FV DistalFull                                                            +---------+---------------+---------+-----------+----------+-----------------+ PFV      Full                                                           +---------+---------------+---------+-----------+----------+-----------------+ POP      Partial        Yes      Yes                  Age Indeterminate +---------+---------------+---------+-----------+----------+-----------------+ PTV      Full                                                           +---------+---------------+---------+-----------+----------+-----------------+ PERO     Full                                                           +---------+---------------+---------+-----------+----------+-----------------+   +----+---------------+---------+-----------+----------+--------------+ LEFTCompressibilityPhasicitySpontaneityPropertiesThrombus Aging +----+---------------+---------+-----------+----------+--------------+ CFV Full           Yes      Yes                                 +----+---------------+---------+-----------+----------+--------------+     Summary: RIGHT: - Findings consistent with age indeterminate deep vein thrombosis involving the right popliteal vein. - No cystic structure found in the popliteal fossa.  LEFT: - No evidence of common femoral vein obstruction.  *See table(s) above for measurements and observations.    Preliminary     Anti-infectives: Anti-infectives (From admission, onward)   Start     Dose/Rate Route Frequency Ordered Stop   08/17/20 1515  ceFEPIme (MAXIPIME) 2 g in sodium chloride 0.9 % 100 mL IVPB        2 g 200 mL/hr over 30 Minutes Intravenous Every 8 hours 08/17/20 1415     08/11/20 0600  piperacillin-tazobactam (ZOSYN) IVPB 3.375 g  Status:  Discontinued        3.375 g 12.5 mL/hr over 240 Minutes Intravenous Every 8 hours 08/10/20 2300 08/17/20 1415   08/10/20 2130  piperacillin-tazobactam (ZOSYN) IVPB 3.375 g         3.375 g 100 mL/hr over 30 Minutes Intravenous  Once 08/10/20 2122 08/10/20 2149      Assessment/Plan: s/p * No surgery found * Advance diet. Continue fulls for now until pain improves Wbc normal. No fevers. 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  Will await diagnostic imaging.  May need ortho eval vs vascular except patient has a great pulse in his foot so doubt this is anything acutely vascular unless has a DVT.  Ibuprofen and voltaren gel to help with pain for now.  Diverticulitis with microperforationand abscess -s/p IR perc drain on 1/12. 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 -WBC still normal today and AF. No urgent needs for surgery, but if fails conservative management, will likely need Hartman's while here. -will try full liquids today, add shakes and supplements -culture shows E coli, sensitivities pending. On cefepime -recheck labs in am.   -concerned if patient does not start to improve, he is going to need OR here. -mobilize/pulm toilet -cont IV cefepime  FEN - SLIV (great UOP), FLD VTE - lovenox ID - cefepime   ADDENDUM: E coli sensitivities are back and reveal significant resistance.  He is being switched to Cefepime.  It is confirmed the patient DOES have Factor V Leiden mutation per hematology's noted.  He is on Lovenox appropriately.  His plain x-rays are negative of his leg/foot.  His duplex shows a R popliteal DVT age indeterminate, but this is likely chronic as this is the same location as his DVT several years ago and  he has chronic edema of this leg c/w chronic occlusive disease. D/w ortho regarding his pain.  Despite the acuteness of his pain, it is possible this may be plantar fasciitis and so we have placed him in a Prafo boot.  This has actually helped his pain significant since this afternoon.  He is tolerating his Full liquids well so far.  I had a long discussion with the patient and his wife explaining everything that is going on so she would understand.  I drew pictures for them as well to explain some of this too.  They were very appreciative.  He did tell me he had a colonoscopy 3 years ago with a large amount of polyps, none supposedly pre-malignant, but he has been requested to have a repeat c-scope in 3 years, which would be this year.  Continue current management with hopes to get him better with conservative therapy, but if he fails, he is aware he will likely need a Hartman's vs repeat CT scan and more drains if possible.  LOS: 8 days    Autumn Messing III 08/18/2020

## 2020-08-19 MED ORDER — OXYCODONE HCL 5 MG PO TABS
5.0000 mg | ORAL_TABLET | ORAL | Status: DC | PRN
  Administered 2020-08-19 – 2020-08-22 (×10): 10 mg via ORAL
  Filled 2020-08-19 (×11): qty 2

## 2020-08-19 NOTE — Progress Notes (Signed)
Subjective/Chief Complaint: Feels better. Pain seems to be improving   Objective: Vital signs in last 24 hours: Temp:  [98.2 F (36.8 C)-98.7 F (37.1 C)] 98.2 F (36.8 C) (01/16 0444) Pulse Rate:  [62-80] 80 (01/16 0444) Resp:  [18-20] 18 (01/16 0444) BP: (119-151)/(87-95) 151/95 (01/16 0444) SpO2:  [94 %-98 %] 96 % (01/16 0444) Last BM Date: 08/18/20  Intake/Output from previous day: 01/15 0701 - 01/16 0700 In: 590 [P.O.:480; IV Piggyback:100] Out: 15 [Drains:15] Intake/Output this shift: No intake/output data recorded.  General appearance: alert and cooperative Resp: clear to auscultation bilaterally Cardio: regular rate and rhythm GI: soft, mild right sided tenderness. drain output feculent  Lab Results:  Recent Labs    08/18/20 0110  WBC 8.1  HGB 11.2*  HCT 34.7*  PLT 252   BMET Recent Labs    08/18/20 0110  NA 142  K 3.8  CL 107  CO2 27  GLUCOSE 110*  BUN 8  CREATININE 0.84  CALCIUM 8.8*   PT/INR No results for input(s): LABPROT, INR in the last 72 hours. ABG No results for input(s): PHART, HCO3 in the last 72 hours.  Invalid input(s): PCO2, PO2  Studies/Results: DG Tibia/Fibula Right  Result Date: 08/17/2020 CLINICAL DATA:  65 year old male with right lower extremity pain swelling and redness. EXAM: RIGHT TIBIA AND FIBULA - 2 VIEW COMPARISON:  Right foot series today. FINDINGS: Visible osseous structures are intact. Preserved alignment at the right knee with mild degenerative changes. No knee joint effusion. Evidence of widespread soft tissue swelling/stranding. No soft tissue gas. No radiopaque foreign body identified. IMPRESSION: Widespread soft tissue swelling/stranding. No acute osseous abnormality identified. Electronically Signed   By: Genevie Ann M.D.   On: 08/17/2020 10:13   DG Foot Complete Right  Result Date: 08/17/2020 CLINICAL DATA:  65 year old male with right lower extremity pain swelling and redness. EXAM: RIGHT FOOT COMPLETE -  3+ VIEW COMPARISON:  No prior radiographs. FINDINGS: Evidence of generalized soft tissue swelling. No soft tissue gas. No radiopaque foreign body identified. Evidence of healed prior fracture at the base of the right 5th metatarsal. No acute osseous abnormality identified. IMPRESSION: Soft tissue swelling with no acute osseous abnormality identified. Electronically Signed   By: Genevie Ann M.D.   On: 08/17/2020 10:11   VAS [place] LOWER EXTREMITY VENOUS (DVT)  Result Date: 08/17/2020  Lower Venous DVT Study Indications: Pain, and Edema.  Comparison Study: no prior Performing Technologist: Abram Sander RVS  Examination Guidelines: A complete evaluation includes B-mode imaging, spectral Doppler, color Doppler, and power Doppler as needed of all accessible portions of each vessel. Bilateral testing is considered an integral part of a complete examination. Limited examinations for reoccurring indications may be performed as noted. The reflux portion of the exam is performed with the patient in reverse Trendelenburg.  +---------+---------------+---------+-----------+----------+-----------------+ RIGHT    CompressibilityPhasicitySpontaneityPropertiesThrombus Aging    +---------+---------------+---------+-----------+----------+-----------------+ CFV      Full           Yes      Yes                                    +---------+---------------+---------+-----------+----------+-----------------+ SFJ      Full                                                           +---------+---------------+---------+-----------+----------+-----------------+  FV Prox  Full                                                           +---------+---------------+---------+-----------+----------+-----------------+ FV Mid   Full                                                           +---------+---------------+---------+-----------+----------+-----------------+ FV DistalFull                                                            +---------+---------------+---------+-----------+----------+-----------------+ PFV      Full                                                           +---------+---------------+---------+-----------+----------+-----------------+ POP      Partial        Yes      Yes                  Age Indeterminate +---------+---------------+---------+-----------+----------+-----------------+ PTV      Full                                                           +---------+---------------+---------+-----------+----------+-----------------+ PERO     Full                                                           +---------+---------------+---------+-----------+----------+-----------------+   +----+---------------+---------+-----------+----------+--------------+ LEFTCompressibilityPhasicitySpontaneityPropertiesThrombus Aging +----+---------------+---------+-----------+----------+--------------+ CFV Full           Yes      Yes                                 +----+---------------+---------+-----------+----------+--------------+     Summary: RIGHT: - Findings consistent with age indeterminate deep vein thrombosis involving the right popliteal vein. - No cystic structure found in the popliteal fossa.  LEFT: - No evidence of common femoral vein obstruction.  *See table(s) above for measurements and observations.    Preliminary     Anti-infectives: Anti-infectives (From admission, onward)   Start     Dose/Rate Route Frequency Ordered Stop   08/17/20 1515  ceFEPIme (MAXIPIME) 2 g in sodium chloride 0.9 % 100 mL IVPB        2 g 200 mL/hr over 30 Minutes Intravenous Every 8 hours 08/17/20 1415     08/11/20 0600  piperacillin-tazobactam (ZOSYN) IVPB 3.375 g  Status:  Discontinued        3.375 g 12.5 mL/hr over 240 Minutes Intravenous Every 8 hours 08/10/20 2300 08/17/20 1415   08/10/20 2130  piperacillin-tazobactam (ZOSYN) IVPB 3.375 g        3.375 g 100 mL/hr over  30 Minutes Intravenous  Once 08/10/20 2122 08/10/20 2149      Assessment/Plan: s/p * No surgery found * Continue fulls another day until pain is a little better  WBC normal but would continue IV cefepime another day because of pain OSA - on Cpap COPD - on O2 prn HTN -prns ordered 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 Will await diagnostic imaging. May need ortho eval vs vascular except patient has a great pulse in his foot so doubt this is anything acutely vascular unless has a DVT. Ibuprofen and voltaren gel to help with pain for now.  Diverticulitis with microperforationand abscess -s/p IR perc drain on 1/12. Output isfeculent c/w fistula. -WBC still normal today and AF. No urgent needs for surgery, but if fails conservative management, will likely need Hartman's while here. -will try full liquids today, add shakes and supplements -culture shows E coli, sensitivities pending. On cefepime -recheck labs in am.  -concerned if patient does not start to improve, he is going to need OR here. -mobilize/pulm toilet -cont IV cefepime  FEN -SLIV (great UOP), FLD VTE - lovenox ID - cefepime   ADDENDUM: E coli sensitivities are back and reveal significant resistance. He is being switched to Cefepime. It is confirmed the patient DOES have Factor V Leiden mutation per hematology's noted. He is on Lovenox appropriately. His plain x-rays are negative of his leg/foot. His duplex shows a R popliteal DVT age indeterminate, but this is likely chronic as this is the same location as his DVT several years ago  and he has chronic edema of this leg c/w chronic occlusive disease. D/w ortho regarding his pain. Despite the acuteness of his pain, it is possible this may be plantar fasciitis and so we have placed him in a Prafo boot. This has actually helped his pain significant since this afternoon. He is tolerating his Full liquids well so far. I had a long discussion with the patient and his wife explaining everything that is going on so she would understand. I drew pictures for them as well to explain some of this too. They were very appreciative. He did tell me he had a colonoscopy 3 years ago with a large amount of polyps, none supposedly pre-malignant, but he has been requested to have a repeat c-scope in 3 years, which would be this year. Continue current management with hopes to get him better with conservative therapy.   LOS: 9 days    Autumn Messing III 08/19/2020

## 2020-08-20 ENCOUNTER — Inpatient Hospital Stay (HOSPITAL_COMMUNITY): Payer: 59

## 2020-08-20 ENCOUNTER — Encounter (HOSPITAL_COMMUNITY): Payer: Self-pay

## 2020-08-20 LAB — BASIC METABOLIC PANEL
Anion gap: 10 (ref 5–15)
BUN: 12 mg/dL (ref 8–23)
CO2: 26 mmol/L (ref 22–32)
Calcium: 9.2 mg/dL (ref 8.9–10.3)
Chloride: 101 mmol/L (ref 98–111)
Creatinine, Ser: 0.87 mg/dL (ref 0.61–1.24)
GFR, Estimated: >60 mL/min (ref >60)
Glucose, Bld: 160 mg/dL — ABNORMAL HIGH (ref 70–99)
Potassium: 3.5 mmol/L (ref 3.5–5.1)
Sodium: 137 mmol/L (ref 135–145)

## 2020-08-20 LAB — CBC
HCT: 38.7 % — ABNORMAL LOW (ref 39.0–52.0)
Hemoglobin: 13 g/dL (ref 13.0–17.0)
MCH: 31.9 pg (ref 26.0–34.0)
MCHC: 33.6 g/dL (ref 30.0–36.0)
MCV: 94.9 fL (ref 80.0–100.0)
Platelets: 343 10*3/uL (ref 150–400)
RBC: 4.08 MIL/uL — ABNORMAL LOW (ref 4.22–5.81)
RDW: 13.2 % (ref 11.5–15.5)
WBC: 7.9 10*3/uL (ref 4.0–10.5)
nRBC: 0 % (ref 0.0–0.2)

## 2020-08-20 MED ORDER — IOHEXOL 300 MG/ML  SOLN
100.0000 mL | Freq: Once | INTRAMUSCULAR | Status: AC | PRN
  Administered 2020-08-20: 100 mL via INTRAVENOUS

## 2020-08-20 MED ORDER — PANTOPRAZOLE SODIUM 40 MG PO TBEC
40.0000 mg | DELAYED_RELEASE_TABLET | Freq: Every day | ORAL | Status: DC
  Administered 2020-08-20 – 2020-08-21 (×2): 40 mg via ORAL
  Filled 2020-08-20 (×2): qty 1

## 2020-08-20 MED ORDER — IOHEXOL 9 MG/ML PO SOLN
500.0000 mL | ORAL | Status: AC
  Administered 2020-08-20 (×2): 500 mL via ORAL

## 2020-08-20 NOTE — Progress Notes (Signed)
Subjective: CC: Patient reports that his RLQ abdominal pain is slightly better today. Still having 2-3/10 pain at rest and 4/10 with ambulation. He notes increased pain when trying to get out of bed. He is tolerating fld without increased abdominal pain or associated n/v. Passing flatus. Liquid/loose Bm yesterday am. Mobilizing. Voiding. R leg/foot pain has improved. Notes continued pain of the right foot arch.   Objective: Vital signs in last 24 hours: Temp:  [98.1 F (36.7 C)-98.7 F (37.1 C)] 98.1 F (36.7 C) (01/17 0456) Pulse Rate:  [64-71] 67 (01/17 0456) Resp:  [16-18] 17 (01/17 0456) BP: (134-152)/(79-88) 146/88 (01/17 0456) SpO2:  [92 %-98 %] 92 % (01/17 0456) Last BM Date: 08/19/20  Intake/Output from previous day: 01/16 0701 - 01/17 0700 In: 110 [IV Piggyback:100] Out: 10 [Drains:10] Intake/Output this shift: No intake/output data recorded.  PE: Gen:  Alert, NAD, pleasant HEENT: EOM's intact, pupils equal and round Card:  RRR Pulm:  CTAB, no W/R/R, effort normal Abd: Soft, protuberant, tenderness of the suprapubic abdomen and RLQ, no peritonitis. +BS. IR drain with purulent fluid in bulb Ext:  No LLE edema. RLE with trace edema. No calf tenderness. No bony tenderness over the foot. He has mild tenderness over the arch of the foot. DP 2+ Psych: A&Ox3  Skin: no rashes noted, warm and dry   Lab Results:  Recent Labs    08/18/20 0110  WBC 8.1  HGB 11.2*  HCT 34.7*  PLT 252   BMET Recent Labs    08/18/20 0110  NA 142  K 3.8  CL 107  CO2 27  GLUCOSE 110*  BUN 8  CREATININE 0.84  CALCIUM 8.8*   PT/INR No results for input(s): LABPROT, INR in the last 72 hours. CMP     Component Value Date/Time   NA 142 08/18/2020 0110   K 3.8 08/18/2020 0110   CL 107 08/18/2020 0110   CO2 27 08/18/2020 0110   GLUCOSE 110 (H) 08/18/2020 0110   BUN 8 08/18/2020 0110   CREATININE 0.84 08/18/2020 0110   CALCIUM 8.8 (L) 08/18/2020 0110   PROT 7.1  08/10/2020 2020   ALBUMIN 3.3 (L) 08/10/2020 2020   AST 27 08/10/2020 2020   ALT 25 08/10/2020 2020   ALKPHOS 60 08/10/2020 2020   BILITOT 1.3 (H) 08/10/2020 2020   GFRNONAA >60 08/18/2020 0110   GFRAA >60 10/13/2019 0539   Lipase     Component Value Date/Time   LIPASE 15 08/10/2020 2020       Studies/Results: No results found.  Anti-infectives: Anti-infectives (From admission, onward)   Start     Dose/Rate Route Frequency Ordered Stop   08/17/20 1515  ceFEPIme (MAXIPIME) 2 g in sodium chloride 0.9 % 100 mL IVPB        2 g 200 mL/hr over 30 Minutes Intravenous Every 8 hours 08/17/20 1415     08/11/20 0600  piperacillin-tazobactam (ZOSYN) IVPB 3.375 g  Status:  Discontinued        3.375 g 12.5 mL/hr over 240 Minutes Intravenous Every 8 hours 08/10/20 2300 08/17/20 1415   08/10/20 2130  piperacillin-tazobactam (ZOSYN) IVPB 3.375 g        3.375 g 100 mL/hr over 30 Minutes Intravenous  Once 08/10/20 2122 08/10/20 2149       Assessment/Plan OSA - on Cpap COPD - on O2 prn HTN -prns ordered 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 Followed by Dr. Delton Coombes previously. Dr. Redmond Pulling discussed with patient about recommendation for chronic anticoagulation use and risk of DVT/PE with underlying condition. Patient would like to think on this.  Acute right foot pain - Plain films negative. LE [place] as above. D/W Ortho 1/14 (see note for more details). Monitor  Diverticulitis with microperforationand abscess - s/p IR perc drain on 1/12. Per notes, output previously feculant and concerning for fistula.  - Repeat labs and CT today - Cont FLD until after CT - culture shows E coli - on Cefepime  -mobilize/pulm toilet  FEN - FLD VTE - lovenox ID - zosyn   LOS: 10 days    Jillyn Ledger , [facility]  Surgery 08/20/2020, 8:43 AM Please see Amion for pager number during day hours 7:00am-4:30pm

## 2020-08-20 NOTE — Plan of Care (Signed)
  Problem: Education: Goal: Knowledge of General Education information will improve Description: Including pain rating scale, medication(s)/side effects and non-pharmacologic comfort measures 08/20/2020 1256 by Bethann Punches, RN Outcome: Progressing 08/20/2020 1255 by Bethann Punches, RN Outcome: Progressing   Problem: Health Behavior/Discharge Planning: Goal: Ability to manage health-related needs will improve 08/20/2020 1256 by Bethann Punches, RN Outcome: Progressing 08/20/2020 1255 by Bethann Punches, RN Outcome: Progressing   Problem: Clinical Measurements: Goal: Ability to maintain clinical measurements within normal limits will improve 08/20/2020 1256 by Bethann Punches, RN Outcome: Progressing 08/20/2020 1255 by Bethann Punches, RN Outcome: Progressing Goal: Will remain free from infection 08/20/2020 1256 by Bethann Punches, RN Outcome: Progressing 08/20/2020 1255 by Bethann Punches, RN Outcome: Progressing Goal: Diagnostic test results will improve 08/20/2020 1256 by Bethann Punches, RN Outcome: Progressing 08/20/2020 1255 by Bethann Punches, RN Outcome: Progressing Goal: Respiratory complications will improve 08/20/2020 1256 by Bethann Punches, RN Outcome: Progressing 08/20/2020 1255 by Bethann Punches, RN Outcome: Progressing Goal: Cardiovascular complication will be avoided 08/20/2020 1256 by Bethann Punches, RN Outcome: Progressing 08/20/2020 1255 by Bethann Punches, RN Outcome: Progressing   Problem: Activity: Goal: Risk for activity intolerance will decrease 08/20/2020 1256 by Bethann Punches, RN Outcome: Progressing 08/20/2020 1255 by Bethann Punches, RN Outcome: Progressing   Problem: Nutrition: Goal: Adequate nutrition will be maintained 08/20/2020 1256 by Bethann Punches, RN Outcome: Progressing 08/20/2020 1255 by Bethann Punches, RN Outcome: Progressing   Problem: Coping: Goal: Level of anxiety will decrease 08/20/2020 1256 by Bethann Punches, RN Outcome:  Progressing 08/20/2020 1255 by Bethann Punches, RN Outcome: Progressing   Problem: Elimination: Goal: Will not experience complications related to bowel motility 08/20/2020 1256 by Bethann Punches, RN Outcome: Progressing 08/20/2020 1255 by Bethann Punches, RN Outcome: Progressing Goal: Will not experience complications related to urinary retention 08/20/2020 1256 by Bethann Punches, RN Outcome: Progressing 08/20/2020 1255 by Bethann Punches, RN Outcome: Progressing   Problem: Pain Managment: Goal: General experience of comfort will improve 08/20/2020 1256 by Bethann Punches, RN Outcome: Progressing 08/20/2020 1255 by Bethann Punches, RN Outcome: Progressing   Problem: Safety: Goal: Ability to remain free from injury will improve 08/20/2020 1256 by Bethann Punches, RN Outcome: Progressing 08/20/2020 1255 by Bethann Punches, RN Outcome: Progressing   Problem: Skin Integrity: Goal: Risk for impaired skin integrity will decrease 08/20/2020 1256 by Bethann Punches, RN Outcome: Progressing 08/20/2020 1255 by Bethann Punches, RN Outcome: Progressing   Problem: Education: Goal: Knowledge of disease or condition will improve Outcome: Progressing Goal: Knowledge of the prescribed therapeutic regimen will improve Outcome: Progressing Goal: Individualized Educational Video(s) Outcome: Progressing   Problem: Activity: Goal: Ability to tolerate increased activity will improve Outcome: Progressing Goal: Will verbalize the importance of balancing activity with adequate rest periods Outcome: Progressing   Problem: Respiratory: Goal: Ability to maintain a clear airway will improve Outcome: Progressing Goal: Levels of oxygenation will improve Outcome: Progressing Goal: Ability to maintain adequate ventilation will improve Outcome: Progressing

## 2020-08-21 ENCOUNTER — Inpatient Hospital Stay: Payer: Self-pay

## 2020-08-21 LAB — CBC
HCT: 38 % — ABNORMAL LOW (ref 39.0–52.0)
Hemoglobin: 12.7 g/dL — ABNORMAL LOW (ref 13.0–17.0)
MCH: 31.4 pg (ref 26.0–34.0)
MCHC: 33.4 g/dL (ref 30.0–36.0)
MCV: 93.8 fL (ref 80.0–100.0)
Platelets: 354 10*3/uL (ref 150–400)
RBC: 4.05 MIL/uL — ABNORMAL LOW (ref 4.22–5.81)
RDW: 13 % (ref 11.5–15.5)
WBC: 7.4 10*3/uL (ref 4.0–10.5)
nRBC: 0 % (ref 0.0–0.2)

## 2020-08-21 LAB — BASIC METABOLIC PANEL
Anion gap: 12 (ref 5–15)
BUN: 11 mg/dL (ref 8–23)
CO2: 23 mmol/L (ref 22–32)
Calcium: 9.2 mg/dL (ref 8.9–10.3)
Chloride: 103 mmol/L (ref 98–111)
Creatinine, Ser: 0.8 mg/dL (ref 0.61–1.24)
GFR, Estimated: >60 mL/min (ref >60)
Glucose, Bld: 98 mg/dL (ref 70–99)
Potassium: 3.9 mmol/L (ref 3.5–5.1)
Sodium: 138 mmol/L (ref 135–145)

## 2020-08-21 MED ORDER — ACETAMINOPHEN 500 MG PO TABS: 1000.0000 mg | ORAL_TABLET | Freq: Four times a day (QID) | ORAL | 0 refills | Status: AC | PRN

## 2020-08-21 MED ORDER — SACCHAROMYCES BOULARDII 250 MG PO CAPS
250.0000 mg | ORAL_CAPSULE | Freq: Two times a day (BID) | ORAL | Status: DC
  Administered 2020-08-21 – 2020-08-22 (×3): 250 mg via ORAL
  Filled 2020-08-21 (×3): qty 1

## 2020-08-21 MED ORDER — CHLORHEXIDINE GLUCONATE CLOTH 2 % EX PADS
6.0000 | MEDICATED_PAD | Freq: Every day | CUTANEOUS | Status: DC
  Administered 2020-08-21 – 2020-08-22 (×2): 6 via TOPICAL

## 2020-08-21 MED ORDER — SACCHAROMYCES BOULARDII 250 MG PO CAPS: ORAL_CAPSULE | ORAL | Status: DC

## 2020-08-21 MED ORDER — SODIUM CHLORIDE 0.9 % IV SOLN
1.0000 g | INTRAVENOUS | Status: DC
  Administered 2020-08-21 – 2020-08-22 (×2): 1000 mg via INTRAVENOUS
  Filled 2020-08-21 (×2): qty 1

## 2020-08-21 MED ORDER — SODIUM CHLORIDE 0.9% FLUSH: 10.0000 mL | INTRAVENOUS | Status: DC | PRN

## 2020-08-21 MED ORDER — OXYCODONE HCL 5 MG PO TABS: 5.0000 mg | ORAL_TABLET | Freq: Four times a day (QID) | ORAL | 0 refills | Status: DC | PRN

## 2020-08-21 MED ORDER — SODIUM CHLORIDE 0.9% FLUSH: INTRAVENOUS | 1 refills | Status: DC

## 2020-08-21 MED ORDER — SODIUM CHLORIDE 0.9% FLUSH
10.0000 mL | Freq: Two times a day (BID) | INTRAVENOUS | Status: DC
  Administered 2020-08-21 – 2020-08-22 (×2): 10 mL

## 2020-08-21 MED ORDER — ADULT MULTIVITAMIN W/MINERALS CH: 1.0000 | ORAL_TABLET | Freq: Every day | ORAL | Status: DC

## 2020-08-21 MED ORDER — DOCUSATE SODIUM 100 MG PO CAPS: 100.0000 mg | ORAL_CAPSULE | Freq: Two times a day (BID) | ORAL | 0 refills | Status: DC | PRN

## 2020-08-21 MED ORDER — DICLOFENAC SODIUM 1 % EX GEL: 2.0000 g | Freq: Four times a day (QID) | CUTANEOUS | 0 refills | Status: DC | PRN

## 2020-08-21 NOTE — Progress Notes (Signed)
[facility] Progress Note     Subjective: CC-  Feeling well today. He reports mild discomfort around the drain at times, but overall this is improved. Tolerating diet without n/v. BM yesterday.  WBC 7.4, afebrile CT yesterday improved with near complete resolution of sigmoid diverticulitis, Resolution of small diverticular abscess following percutaneous catheter drainage, and slight decrease in size of small abscess in right paracolic gutter. He left a message with Dr. Tomie China office yesterday regarding anticoagulation but has not heard back yet.  Objective: Vital signs in last 24 hours: Temp:  [97.8 F (36.6 C)-97.9 F (36.6 C)] 97.8 F (36.6 C) (01/18 0511) Pulse Rate:  [69-71] 69 (01/18 0511) Resp:  [17] 17 (01/18 0511) BP: (137-149)/(92-97) 149/97 (01/18 0511) SpO2:  [95 %-99 %] 99 % (01/18 0511) Last BM Date: 08/20/20  Intake/Output from previous day: 01/17 0701 - 01/18 0700 In: 125 [P.O.:120] Out: 10 [Drains:10] Intake/Output this shift: No intake/output data recorded.  PE: Gen:  Alert, NAD, pleasant Card:  RRR Pulm:  rate and effort normal Abd: Soft, protuberant, mild RLQ tenderness and TTP around drain without rebound or guarding, +BS. IR drain with feculent appearing fluid in bulb Psych: A&Ox3  Skin: no rashes noted, warm and dry   Lab Results:  Recent Labs    08/20/20 0901 08/21/20 0105  WBC 7.9 7.4  HGB 13.0 12.7*  HCT 38.7* 38.0*  PLT 343 354   BMET Recent Labs    08/20/20 0901 08/21/20 0105  NA 137 138  K 3.5 3.9  CL 101 103  CO2 26 23  GLUCOSE 160* 98  BUN 12 11  CREATININE 0.87 0.80  CALCIUM 9.2 9.2   PT/INR No results for input(s): LABPROT, INR in the last 72 hours. CMP     Component Value Date/Time   NA 138 08/21/2020 0105   K 3.9 08/21/2020 0105   CL 103 08/21/2020 0105   CO2 23 08/21/2020 0105   GLUCOSE 98 08/21/2020 0105   BUN 11 08/21/2020 0105   CREATININE 0.80 08/21/2020 0105   CALCIUM 9.2 08/21/2020  0105   PROT 7.1 08/10/2020 2020   ALBUMIN 3.3 (L) 08/10/2020 2020   AST 27 08/10/2020 2020   ALT 25 08/10/2020 2020   ALKPHOS 60 08/10/2020 2020   BILITOT 1.3 (H) 08/10/2020 2020   GFRNONAA >60 08/21/2020 0105   GFRAA >60 10/13/2019 0539   Lipase     Component Value Date/Time   LIPASE 15 08/10/2020 2020       Studies/Results: CT ABDOMEN PELVIS W CONTRAST  Result Date: 08/20/2020 CLINICAL DATA:  Follow-up perforated sigmoid diverticulitis and abscesses. EXAM: CT ABDOMEN AND PELVIS WITH CONTRAST TECHNIQUE: Multidetector CT imaging of the abdomen and pelvis was performed using the standard protocol following bolus administration of intravenous contrast. CONTRAST:  149mL OMNIPAQUE IOHEXOL 300 MG/ML  SOLN COMPARISON:  08/14/2020 FINDINGS: Lower Chest: No acute findings. Hepatobiliary: Moderate to severe diffuse hepatic steatosis again noted. No hepatic masses identified. Gallbladder is unremarkable. No evidence of biliary ductal dilatation. Pancreas:  No mass or inflammatory changes. Spleen: Within normal limits in size and appearance. Adrenals/Urinary Tract: No masses identified. Small bilateral renal cysts again noted. No evidence of ureteral calculi or hydronephrosis. Stomach/Bowel: Sigmoid diverticulitis has nearly completely resolved since previous study. A right lower quadrant percutaneous drainage catheter is seen in place, with resolution of previously seen diverticular abscess at this site. A small rim enhancing fluid collection is seen in the right paracolic gutter, which is slightly decreased in size,  currently measuring 5.8 x 1.3 cm on image 58/3. No other abnormal fluid collections identified. No evidence of bowel obstruction. Vascular/Lymphatic: No pathologically enlarged lymph nodes. No abdominal aortic aneurysm. Aortic atherosclerotic calcification noted. 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 No masses identified. Other:  None. Musculoskeletal: No  suspicious bone lesions identified. Bilateral hip prostheses again noted. IMPRESSION: Near complete resolution of sigmoid diverticulitis since previous study. Resolution of small diverticular abscess following percutaneous catheter drainage. Slight decrease in size of small abscess in right paracolic gutter. Stable hepatic steatosis. Aortic Atherosclerosis (ICD10-I70.0). Electronically Signed   By: Marlaine Hind M.D.   On: 08/20/2020 14:27   [place] EKG SITE RITE  Result Date: 08/21/2020 If Site Rite image not attached, placement could not be confirmed due to current cardiac rhythm.   Anti-infectives: Anti-infectives (From admission, onward)   Start     Dose/Rate Route Frequency Ordered Stop   08/21/20 1030  ertapenem (INVANZ) 1,000 mg in sodium chloride 0.9 % 100 mL IVPB        1 g 200 mL/hr over 30 Minutes Intravenous Every 24 hours 08/21/20 0937     08/17/20 1515  ceFEPIme (MAXIPIME) 2 g in sodium chloride 0.9 % 100 mL IVPB  Status:  Discontinued        2 g 200 mL/hr over 30 Minutes Intravenous Every 8 hours 08/17/20 1415 08/21/20 0937   08/11/20 0600  piperacillin-tazobactam (ZOSYN) IVPB 3.375 g  Status:  Discontinued        3.375 g 12.5 mL/hr over 240 Minutes Intravenous Every 8 hours 08/10/20 2300 08/17/20 1415   08/10/20 2130  piperacillin-tazobactam (ZOSYN) IVPB 3.375 g        3.375 g 100 mL/hr over 30 Minutes Intravenous  Once 08/10/20 2122 08/10/20 2149       Assessment/Plan OSA - on Cpap COPD - on O2 prn HTN -prns ordered 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 Followed by Dr. Delton Coombes previously. Dr. Redmond Pulling discussed with patient about recommendation for chronic anticoagulation use and risk of DVT/PE with underlying condition. Patient will discuss with Dr. Delton Coombes and think about this Acute right foot pain - Plain films  negative. LE [place] as above. D/W Ortho 1/14 (see note for more details). Monitor  Diverticulitis with microperforationand abscess - s/p IR perc drain on 1/12. Output feculant and concerning for fistula. - Repeat CT 1/17 showed improved with near complete resolution of sigmoid diverticulitis, Resolution of small diverticular abscess following percutaneous catheter drainage, and slight decrease in size of small abscess in right paracolic gutter - tolerated soft diet - WBC WNL,afebrile - culture shows ESBL - discussed with Dr. Tommy Medal of ID and there are no oral antibiotic options. Will order PICC and home health to go home on IV antibiotics (invanz) -mobilize/pulm toilet - If able to arrange all this he will be ready for discharge later today  FEN -soft diet VTE - lovenox ID - zosyn 1/7>>1/14, cefepime 1/14>>1/18, invanz 1/18>> Follow up - IR, Dr. Marcello Moores, Dr. Delton Coombes    LOS: 11 days    Wellington Hampshire, [facility] 08/21/2020, 9:38 AM Please see Amion for pager number during day hours 7:00am-4:30pm

## 2020-08-21 NOTE — TOC Initial Note (Addendum)
Transition of Care [facility]) - Initial/Assessment Note    Patient Details  Name: [name] MRN: [id number] Date of Birth: 04-01-1956  [facility] CM/SW Contact:    Marilu Favre, RN Phone Number: 08/21/2020, 12:04 PM  Clinical Narrative:                 Confirmed face sheet with patient. Discussed home IV ABX. Patient aware he and wife  will be taught how to adm IV ABX prior to discharge.   Referral given to [facility] with [facility]. [facility] will "run" patients insurance. Per Pam sometimes patient's insurance requires pre authorization.   Patient's primary insurance is [employer]. 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  [place] with [facility] no longer takes [employer]  [facility] [place] checking to see if she can accept referral. [place] unable to accept referral due to staffing, will not have any available staffing for over 9 days in patient's area.  [place] spoke to [place] , they no longer take [employer]    [facility]  Left  Amy a message , awaiting call back. Amy checking insurance awaiting determination     [place] a message , awaiting call back   [facility] spoke with Malachy Mood , they do not accept patient's insurance.  [facility]oyle Sauer with  [facility] does not cover patient's address or take his insurance   [place] [facility] ( [facility] )  [phone]  Left a message with intake awaiting call back. They returned call and cannot accept due to staffing   1500 Updated Brooke PA , patient and patient's wife , NCM has not secured [place] or [place] agency therefore most likely discharge will not be today   Expected  Discharge Plan: [place]     Patient Goals and CMS Choice Patient states their goals for this hospitalization and ongoing recovery are:: to return to home CMS Medicare.gov Compare Post Acute Care list provided to:: Patient Choice offered to / list presented to : Patient  Expected Discharge Plan and Services Expected Discharge Plan: [place]   Discharge Planning Services: CM Consult Post Acute Care Choice: [place] arrangements for the past 2 months: Single Family Home                   DME Agency: NA       HH Arranged: RN          Prior Living Arrangements/Services Living arrangements for the past 2 months: Single Family Home Lives with:: Spouse Patient language and need for interpreter reviewed:: Yes Do you feel safe going back to the place where you live?: Yes      Need for Family Participation in Patient Care: Yes (Comment) Care giver support system in place?: Yes (comment)   Criminal Activity/Legal Involvement Pertinent to Current Situation/Hospitalization: No - Comment as needed  Activities of Daily Living Home Assistive Devices/Equipment: None ADL Screening (condition at time of admission) Patient's cognitive ability adequate to safely complete daily activities?: Yes Is the patient deaf or have difficulty hearing?: No Does the patient have difficulty seeing,  even when wearing glasses/contacts?: No Does the patient have difficulty concentrating, remembering, or making decisions?: No Patient able to express need for assistance with ADLs?: Yes Does the patient have difficulty dressing or bathing?: No Independently performs ADLs?: Yes (appropriate for developmental age) Does the patient have difficulty walking or climbing stairs?: No Weakness of Legs: None Weakness of Arms/Hands: None  Permission Sought/Granted   Permission granted to share information with : No              Emotional Assessment Appearance::  Appears stated age Attitude/Demeanor/Rapport: Engaged Affect (typically observed): Accepting Orientation: : Oriented to Self,Oriented to Place,Oriented to  Time,Oriented to Situation Alcohol / Substance Use: Not Applicable Psych Involvement: No (comment)  Admission diagnosis:  Diverticulitis [K57.92] Diverticulitis large intestine [K57.32] Patient Active Problem List   Diagnosis Date Noted  . Diverticulitis of colon 08/10/2020  . Alcohol abuse 10/12/2019  . Acute alcoholic pancreatitis 42/35/3614  . Avascular necrosis of femur ([place]) 01/05/2019  . Lumbar radiculopathy 01/05/2019  . History of colonic polyps 08/19/2018  . Esophageal stenosis 08/19/2018  . DVT (deep venous thrombosis) ([place]) 05/11/2018  . Solid nodule of lung greater than 8 mm in diameter 05/11/2018  . Abnormal CT scan, colon 04/13/2018  . Obstructive sleep apnea treated with continuous positive airway pressure (CPAP) 09/29/2017  . GERD (gastroesophageal reflux disease) 04/07/2017  . Dysphagia 01/29/2017  . Malignant neoplasm of prostate ([place]) 01/19/2017  . Pancreatitis, alcoholic 43/15/4008  . Alcoholism ([place]) 11/11/2011  . Tobacco abuse 11/11/2011  . Thrombocytopenia ([place]) 11/11/2011  . HYPERSOMNIA, ASSOCIATED WITH SLEEP APNEA 09/27/2009  . COPD (chronic obstructive pulmonary disease) ([place]) 09/07/2009  . Pneumothorax 09/07/2009  . BURSITIS 09/07/2009  . FATIGUE / MALAISE 09/07/2009  . SHORTNESS OF BREATH 09/07/2009  . PNEUMOTHORAX 09/07/2009   PCP:  [name], MD Pharmacy:   [facility], [place] [address] Phone: [phone] Fax: [phone]  [place],  [place] [address] Phone: [phone] Fax: [phone]     Social Determinants of Health (SDOH) Interventions    Readmission Risk Interventions No flowsheet data  found.

## 2020-08-21 NOTE — Progress Notes (Signed)
   Diverticular RLQ abscess drain placed in IR 08/15/20  For DC today per [facility]  Rec: flush drain daily 5 cc sterile saline  (Rx from DC Physician) Follow up with IR OP [facility] He will hear from scheduler for time and date of appt.  Call [phone] if any questions or concerns

## 2020-08-21 NOTE — Progress Notes (Signed)
RT NOTE:  Pt on CPAP when RT arrived. 4L O2 bled in.

## 2020-08-21 NOTE — Progress Notes (Signed)
Peripherally Inserted Central Catheter Placement  The IV Nurse has discussed with the patient and/or persons authorized to consent for the patient, the purpose of this procedure and the potential benefits and risks involved with this procedure.  The benefits include less needle sticks, lab draws from the catheter, and the patient may be discharged home with the catheter. Risks include, but not limited to, infection, bleeding, blood clot (thrombus formation), and puncture of an artery; nerve damage and irregular heartbeat and possibility to perform a PICC exchange if needed/ordered by physician.  Alternatives to this procedure were also discussed.  Bard Power PICC patient education guide, fact sheet on infection prevention and patient information card has been provided to patient /or left at bedside.    PICC Placement Documentation  PICC Single Lumen 08/21/20 PICC Right Cephalic 43 cm 0 cm (Active)  Exposed Catheter (cm) 0 cm 08/21/20 1250  Site Assessment Clean;Dry;Intact 08/21/20 1250  Line Status Saline locked;Blood return noted 08/21/20 1250  Dressing Type Transparent;Securing device 08/21/20 1250  Antimicrobial disc in place? Yes 08/21/20 1250  Safety Lock Not Applicable 07/27/81 5003  Dressing Change Due 08/28/20 08/21/20 1250       [name] 08/21/2020, 12:58 PM

## 2020-08-21 NOTE — Discharge Summary (Signed)
[facility] Discharge Summary   Patient ID: [name] MRN: [id number] DOB/AGE: 64-Aug-1957 64 y.o.  Admit date: 08/10/2020 Discharge date: 08/22/2020  Admitting Diagnosis: Sigmoid diverticulitis with microperforation   Discharge Diagnosis Diverticulitis with microperforationand abscess OSA COPD HTN History of RLE DVT Factor V Leiden mutation Acute right foot pain  Consultants Interventional radiology  Imaging: CT ABDOMEN PELVIS W CONTRAST  Result Date: 08/20/2020 CLINICAL DATA:  Follow-up perforated sigmoid diverticulitis and abscesses. EXAM: CT ABDOMEN AND PELVIS WITH CONTRAST TECHNIQUE: Multidetector CT imaging of the abdomen and pelvis was performed using the standard protocol following bolus administration of intravenous contrast. CONTRAST:  163mL OMNIPAQUE IOHEXOL 300 MG/ML  SOLN COMPARISON:  08/14/2020 FINDINGS: Lower Chest: No acute findings. Hepatobiliary: Moderate to severe diffuse hepatic steatosis again noted. No hepatic masses identified. Gallbladder is unremarkable. No evidence of biliary ductal dilatation. Pancreas:  No mass or inflammatory changes. Spleen: Within normal limits in size and appearance. Adrenals/Urinary Tract: No masses identified. Small bilateral renal cysts again noted. No evidence of ureteral calculi or hydronephrosis. Stomach/Bowel: Sigmoid diverticulitis has nearly completely resolved since previous study. A right lower quadrant percutaneous drainage catheter is seen in place, with resolution of previously seen diverticular abscess at this site. A small rim enhancing fluid collection is seen in the right paracolic gutter, which is slightly decreased in size, currently measuring 5.8 x 1.3 cm on image 58/3. No other abnormal fluid collections identified. No evidence of bowel obstruction. Vascular/Lymphatic: No pathologically enlarged lymph nodes. No abdominal aortic aneurysm. Aortic atherosclerotic calcification noted. 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 No masses identified. Other:  None. Musculoskeletal: No suspicious bone lesions identified. Bilateral hip prostheses again noted. IMPRESSION: Near complete resolution of sigmoid diverticulitis since previous study. Resolution of small diverticular abscess following percutaneous catheter drainage. Slight decrease in size of small abscess in right paracolic gutter. Stable hepatic steatosis. Aortic Atherosclerosis (ICD10-I70.0). Electronically Signed   By: Marlaine Hind M.D.   On: 08/20/2020 14:27   [place] EKG SITE RITE  Result Date: 08/21/2020 If Site Rite image not attached, placement could not be confirmed due to current cardiac rhythm.   Procedures Dr. Serafina Royals (08/15/2020) - CT guided diverticular abscess drain placement  Hospital Course:  [name] is a 65yo male PMH OSA, COPD, HTN, History of RLE DVT, Factor V Leiden mutation who presented to [facility]20 with acute onset lower abdominal pain.  Workup showed leukocytosis with WBC 15.9 and CT abdomen/pelvis with diverticulitis with microperforation in the RLQ.  Patient was admitted admitted to the surgical service for bowel rest and IV antibiotics/zosyn. Due to persistent abdominal pain CT scan was repeated 08/14/20 and revealed perforated sigmoid diverticulitis with new pericolonic and right-sided collections consistent with abscess. Interventional radiology was consulted and placed percutaneous drain on 08/16/19. Following this his abdominal pain improved and diet was advanced as tolerated.  Cultures from drain fluid grew E coli resistant to several antibiotics. He was transitioned to cefepime 08/17/20. CT scan again repeated 08/20/20 and showed improved with near complete resolution of sigmoid diverticulitis, Resolution of small diverticular abscess following percutaneous catheter drainage, and slight decrease in size of small abscess in right paracolic gutter.  On 1/19, the patient was voiding  well, tolerating diet, ambulating well, pain well controlled, vital signs stable and felt stable for discharge home.  No oral antibiotic options given organism resistance, therefore he was discharged home with PICC line and IV invanz. Patient will follow up as below and knows to  call with questions or concerns.    I have personally reviewed the patients medication history on the [place] controlled substance database.    Allergies as of 08/22/2020   No Known Allergies     Medication List    STOP taking these medications   predniSONE 10 MG (21) Tbpk tablet Commonly known as: STERAPRED UNI-PAK 21 TAB     TAKE these medications   acetaminophen 500 MG tablet Commonly known as: TYLENOL Take 2 tablets (1,000 mg total) by mouth every 6 (six) hours as needed for mild pain.   diclofenac Sodium 1 % Gel Commonly known as: VOLTAREN Apply 2 g topically 4 (four) times daily as needed (pain).   docusate sodium 100 MG capsule Commonly known as: COLACE Take 1 capsule (100 mg total) by mouth 2 (two) times daily as needed for mild constipation.   ertapenem 1,000 mg in sodium chloride 0.9 % 100 mL Inject 1,000 mg into the vein daily for 21 days.   Ibuprofen 200 MG Caps Take 1 capsule by mouth daily as needed (For pain).   lidocaine 5 % Commonly known as: LIDODERM Place 1 patch onto the skin daily as needed (For back pain). Remove & Discard patch within 12 hours or as directed by MD   multivitamin with minerals Tabs tablet Take 1 tablet by mouth daily.   oxyCODONE 5 MG immediate release tablet Commonly known as: Oxy IR/ROXICODONE Take 1-2 tablets (5-10 mg total) by mouth every 6 (six) hours as needed for severe pain.   saccharomyces boulardii 250 MG capsule Commonly known as: FLORASTOR Recommend taking a probiotic, you can find this over the counter   sodium chloride flush 0.9 % Soln Commonly known as: NS Flush abdominal drain with 5cc normal saline once daily.   Trelegy Ellipta 100-62.5-25  MCG/INH Aepb Generic drug: Fluticasone-Umeclidin-Vilant Inhale 1 puff into the lungs daily.         Follow-up Information    Suttle, Rosanne Ashing, MD. Call in 10 day(s).   Specialties: Interventional Radiology, Diagnostic Radiology, Radiology Why: pt will hear from scheduler for follow up with Dr Serafina Royals and [facility]; call [phone] if any questions or concerns Contact information: [place] [address] [phone]        Leighton Ruff, MD. Go on 5/46/2703.   Specialty: General Surgery Why: Your appointment is 09/03/20 @ 1:30pm Please arrive 30 minutes prior to your appointment to check in and fill out paperwork. Bring photo ID and insurance information. Contact information: [address] [address] [phone]        [name], MD. Call.   Specialty: Family Medicine Why: Call to arrange follow up with your primary care physician Contact information: [place] [address] [phone]        Derek Jack, MD. Call.   Specialty: Hematology Why: Call to arrange follow up to discuss anticoagluation Contact information: [address] [place] [address] [phone]               Signed: [place], [facility] 08/21/2020, 2:45 PM Please see Amion for pager number during day hours 7:00am-4:30pm

## 2020-08-21 NOTE — Discharge Instructions (Addendum)
[email or web address]">  Percutaneous Abscess Drain Placement, Care After This sheet gives you information about how to care for yourself after your procedure. Your health care provider may also give you more specific instructions. If you have problems or questions, contact your health care provider. What can I expect after the procedure? After the procedure, it is common to have:  A small amount of bruising and discomfort in the area where the drainage tube (catheter) was placed.  Sleepiness and fatigue. This should go away after the medicines you were given have worn off. Follow these instructions at home: Incision care  Follow instructions from your health care provider about how to take care of your incision. Make sure you: ? Wash your hands with soap and water for at least 20 seconds before and after you change your bandage (dressing). If soap and water are not available, use hand sanitizer. ? Change your dressing as told by your health care provider. ? Leave stitches (sutures), skin glue, or adhesive strips in place. These skin closures may need to stay in place for 2 weeks or longer. If adhesive strip edges start to loosen and curl up, you may trim the loose edges. Do not remove adhesive strips completely unless your health care provider tells you to do that.  Check your incision area every day for signs of infection. Check for: ? More redness, swelling, or pain. ? More fluid or blood. ? Warmth. ? Pus or a bad smell.   Catheter care  Follow instructions from your health care provider about emptying and cleaning your catheter and collection bag or drainage bulb. You may need to clean the catheter every day so it does not clog.  If directed, write down the following information every time you empty your bag: ? The date and time. ? The amount of drainage.  Check for fluid leaking from around your catheter (instead of fluid draining through your catheter). This may be a sign  that the drain is no longer working correctly. Activity  Rest at home for 1-2 days after your procedure.  Return to your normal activities as told by your health care provider. Ask your health care provider what activities are safe for you.  If you were given a sedative during the procedure, it can affect you for several hours. Do not drive or operate machinery until your health care provider says that it is safe. General instructions  Take over-the-counter and prescription medicines only as told by your health care provider.  If you were prescribed an antibiotic medicine, take it as told by your health care provider. Do not stop using the antibiotic even if you start to feel better.  Do not take showers, take baths, swim, or use a hot tub for 24 hours after your procedure or until your health care provider says that this is okay.  Do not use any products that contain nicotine or tobacco, such as cigarettes, e-cigarettes, and chewing tobacco. If you need help quitting, ask your health care provider.  Keep all follow-up visits as told by your health care provider. This is important. Contact a health care provider if:  You have less than 10 mL of drainage a day for 2-3 days in a row, or as directed by your health care provider.  You have any of these signs of infection: ? More redness, swelling, or pain around your incision area. ? More fluid or blood coming from your incision area. ? Warmth coming from your incision area. ? Pus or  a bad smell coming from your incision area.  You have fluid leaking from around your catheter (instead of through your catheter).  You have a fever or chills.  You have pain that does not get better with medicine. Get help right away if:  Your catheter comes out.  You suddenly stop having drainage from your catheter.  You suddenly have blood in the fluid that is draining from your catheter.  You become dizzy or you faint.  You develop a  rash.  You have nausea or vomiting.  You have difficulty breathing or you feel short of breath.  You develop chest pain.  You have problems with your speech or vision.  You have trouble balancing or moving your arms or legs. Summary  It is common to have a small amount of bruising and discomfort in the area where the drainage tube (catheter) was placed.  Follow instructions from your health care provider about emptying and cleaning your catheter and collection bag or drainage bulb.  You may be directed to record the amount of drainage from the bag every time you empty it.  Contact a health care provider if you have more redness, swelling, or pain around your incision area or if you have pain that does not get better with medicine. This information is not intended to replace advice given to you by your health care provider. Make sure you discuss any questions you have with your health care provider. Document Revised: 07/16/2019 Document Reviewed: 07/16/2019 Elsevier Patient Education  [place].    Low-Fiber Eating Plan Fiber is found in fruits, vegetables, whole grains, and beans. Eating a diet low in fiber helps to reduce how often you have bowel movements and the amount of stool you produce. A low-fiber eating plan may help your digestive system heal if you:  Have certain conditions, such as Crohn's disease, diverticulitis, or irritable bowel syndrome (IBS), and are having a flare-up.  Recently had radiation therapy on your pelvis or bowel.  Recently had intestinal surgery.  Have a new surgical opening in your abdomen (colostomy or ileostomy).  Have an intestine that has narrowed (stricture). Your health care provider will tell you how long to stay on this diet and may recommend that you work with a dietitian. What are tips for following this plan? Reading food labels  Check the nutrition facts label on food products for the amount of dietary fiber.  Choose  foods that have less than 2 grams (g) of fiber per serving.   Cooking  Use white flour for baking and cooking.  Cook meat using methods that keep it tender, such as braising or poaching.  Cook eggs until the yolk is completely solid.  Cook with healthy oils, such as olive oil or canola oil. Meal planning  Eat 5-6 small meals throughout the day instead of 3 large meals.  If you are lactose intolerant: ? Choose low-lactose dairy foods. ? Do not eat dairy foods if told by your health care provider or dietitian.  Limit fats and oils to less than 8 teaspoons (39 mL) a day.  Eat small portions of desserts.  Limit acidic, spicy, or fried foods to reduce gas, bloating, and discomfort. General information  Follow instructions from your health care provider or dietitian about how much fiber you should have each day.  Most people on a low-fiber eating plan should eat less than 10 g of fiber a day. Your daily fiber goal is _________________ g.  Take vitamin and  mineral supplements as told by your health care provider or dietitian. Chewable or liquid forms are best when on this eating plan. A gummy vitamin is not recommended. What foods should I eat? Fruits Soft-cooked or canned fruits without skin and seeds. Ripe banana. Applesauce. Fruit juice without pulp. Vegetables Well-cooked or canned vegetables without skin, seeds, or stems. Cooked potatoes without skins. Vegetable juice. Grains All bread and crackers made with white flour. Waffles, pancakes, and [place] toast. Bagels. Pretzels. Melba toast, zwieback, and matzoh. Cooked and dried cereals that do not have whole grains, added fiber, seeds, or dried fruit. Denzil MagnusonFarina. Hot and cold cereals made with refined corn, rice, or oats. Plain pasta and noodles. White rice. Meats and other proteins Ground meat. Tender cuts of meat or poultry. Eggs. Fish, seafood, and shellfish. Smooth nut butters. Tofu. Dairy All milk products and drinks.  Lactose-free milk, including rice, soy, and almond milk. Yogurt without fruit, nuts, chocolate, or granola mixed in. Sour cream. Cottage cheese. Cheese. Fats and oils Olive oil, canola oil, sunflower oil, flaxseed oil, avocado oil, and grapeseed oil. Mayonnaise. Cream cheese. Margarine. Butter. Beverages Decaf coffee. Fruit and vegetable juices. Smoothies (in small amounts, with no pulp or skins, and with fruits from the recommended list). Sports drinks. Herbal tea. Water. Sweets and desserts Plain cakes. Cookies. Cream pies and pies made with recommended fruits. Pudding. Custard. Fruit gelatin. Sherbet. Ice pops. Ice cream without nuts. Hard candy. Honey. Jelly. Molasses. Syrups. Chocolate. Marshmallows. Gumdrops. Seasonings and condiments Ketchup. Mild mustard. Mild salad dressings. Plain gravies. Vinegar. Spices in moderation. Salt. Sugar. Other foods Bouillon. Broth. Cream and strained soups made from recommended foods. Casseroles made with recommended foods. The items listed above may not be a complete list of foods and beverages you can eat. Contact a dietitian for more information. What foods should I avoid? Fruits Raw or dried fruit. Berries. Fruit juice with pulp. Prune juice. Vegetables Potato skins. Raw or undercooked vegetables. All beans and bean sprouts. Cooked greens. Corn. Peas. Cabbage. Beets. Broccoli. Brussels sprouts. Cauliflower. Mushrooms. Onions. Peppers. Parsnips. Okra. Sauerkraut. Grains Whole-wheat, whole-grain, or multigrain breads, cereals, or crackers. Rye bread. Cereals with nuts, raisins, or coconut. Bran. Granola. High-fiber cereals. Cornmeal or corn bread. Whole-grain pasta. Wild or brown rice. Quinoa. Popcorn. Buckwheat. Wheat germ. Meats and other proteins Tough, fibrous meats with gristle. Fatty meat. Poultry with skin. Fried meat, Environmental education officerpoultry, or fish. Precooked or cured meat, such as sausages or meat loaves. Tomasa BlaseBacon. Hot dogs. Nuts and chunky nut butter. Dried  peas, beans, and lentils. Hummus. Dairy Yogurt with fruit, nuts, chocolate, or granola mixed in. Full-fat dairy such as whole milk, ice cream, or sour cream. Beverages Caffeinated coffee and teas. Fats and oils Avocado. Coconut. Butter. Sweets and desserts Desserts, cookies, or candies that contain nuts or coconut. Dried fruit. Jams and preserves with seeds. Marmalade. Any dessert made with fruits or grains that are not recommended. Seasonings and condiments Relish. Horseradish. Rosita FirePickles. Olives. Other foods Corn tortilla chips. Soups made with vegetables or grains that are not recommended. The items listed above may not be a complete list of foods and beverages you should avoid. Contact a dietitian for more information. Summary  Most people on a low-fiber eating plan should eat less than 10 grams of fiber a day. Follow recommendations from your health care provider or dietitian about how much fiber you should have each day.  Always check nutrition facts labels to see the dietary fiber amount in packaged foods. A low-fiber food will have  less than 2 grams of fiber per serving.  Try to avoid whole grains, raw fruits and vegetables, dried fruit, tough cuts of meat, nuts, and seeds.  Take a vitamin and mineral supplement as told by your health care provider or dietitian. This information is not intended to replace advice given to you by your health care provider. Make sure you discuss any questions you have with your health care provider. Document Revised: 11/24/2019 Document Reviewed: 11/24/2019 Elsevier Patient Education  [place].    Diverticulitis  Diverticulitis is when small pouches in your colon (large intestine) get infected or swollen. This causes pain in the belly (abdomen) and watery poop (diarrhea). These pouches are called diverticula. The pouches form in people who have a condition called diverticulosis. What are the causes? This condition may be caused by poop  (stool) that gets trapped in the pouches in your colon. The poop lets germs (bacteria) grow in the pouches. This causes the infection. What increases the risk? You are more likely to get this condition if you have small pouches in your colon. The risk is higher if:  You are overweight or very overweight (obese).  You do not exercise enough.  You drink alcohol.  You smoke or use products with tobacco in them.  You eat a diet that has a lot of red meat such as beef, pork, or lamb.  You eat a diet that does not have enough fiber in it.  You are older than 64 years of age. What are the signs or symptoms?  Pain in the belly. Pain is often on the left side, but it may be in other areas.  Fever and feeling cold.  Feeling like you may vomit.  Vomiting.  Having cramps.  Feeling full.  Changes to how often you poop.  Blood in your poop. How is this treated? Most cases are treated at home by:  Taking over-the-counter pain medicines.  Following a clear liquid diet.  Taking antibiotic medicines.  Resting. Very bad cases may need to be treated at a hospital. This may include:  Not eating or drinking.  Taking prescription pain medicine.  Getting antibiotic medicines through an IV tube.  Getting fluid and food through an IV tube.  Having surgery. When you are feeling better, your doctor may tell you to have a test to check your colon (colonoscopy). Follow these instructions at home: Medicines  Take over-the-counter and prescription medicines only as told by your doctor. These include: ? Antibiotics. ? Pain medicines. ? Fiber pills. ? Probiotics. ? Stool softeners.  If you were prescribed an antibiotic medicine, take it as told by your doctor. Do not stop taking the antibiotic even if you start to feel better.  Ask your doctor if the medicine prescribed to you requires you to avoid driving or using machinery. Eating and drinking  Follow a diet as told by your  doctor.  When you feel better, your doctor may tell you to change your diet. You may need to eat a lot of fiber. Fiber makes it easier to poop (have a bowel movement). Foods with fiber include: ? Berries. ? Beans. ? Lentils. ? Green vegetables.  Avoid eating red meat.   General instructions  Do not use any products that contain nicotine or tobacco, such as cigarettes, e-cigarettes, and chewing tobacco. If you need help quitting, ask your doctor.  Exercise 3 or more times a week. Try to get 30 minutes each time. Exercise enough to sweat and make  your heart beat faster.  Keep all follow-up visits as told by your doctor. This is important. Contact a doctor if:  Your pain does not get better.  You are not pooping like normal. Get help right away if:  Your pain gets worse.  Your symptoms do not get better.  Your symptoms get worse very fast.  You have a fever.  You vomit more than one time.  You have poop that is: ? Bloody. ? Black. ? Tarry. Summary  This condition happens when small pouches in your colon get infected or swollen.  Take medicines only as told by your doctor.  Follow a diet as told by your doctor.  Keep all follow-up visits as told by your doctor. This is important. This information is not intended to replace advice given to you by your health care provider. Make sure you discuss any questions you have with your health care provider. Document Revised: 05/02/2019 Document Reviewed: 05/02/2019 Elsevier Patient Education  2021 [place] Guide A peripherally inserted central catheter (PICC) is a form of IV access that allows medicines and IV fluids to be quickly distributed throughout the body. The PICC is a long, thin, flexible tube (catheter) that is inserted into a vein in the upper arm. The catheter ends in a large vein in the chest (superior vena cava, or SVC). After the PICC is inserted, a chest X-ray may be done to make sure that  it is in the correct place. A PICC may be placed for different reasons, such as:  To give medicines and liquid nutrition.  To give IV fluids and blood products.  If there is trouble placing a peripheral intravenous (PIV) catheter. If taken care of properly, a PICC can remain in place for several months. Having a PICC can also allow a person to go home from the hospital sooner. Medicine and PICC care can be managed at home by a family member, caregiver, or home health care team. What are the risks? Generally, having a PICC is safe. However, problems may occur, including:  A blood clot (thrombus) forming in or at the tip of the PICC.  A blood clot forming in a vein (deep vein thrombosis) or traveling to the lung (pulmonary embolism).  Inflammation of the vein (phlebitis) in which the PICC is placed.  Infection. Central line associated blood stream infection (CLABSI) is a serious infection that often requires hospitalization.  PICC movement (malposition). The PICC tip may move from its original position due to excessive physical activity, forceful coughing, sneezing, or vomiting.  A break or cut in the PICC. It is important not to use scissors near the PICC.  Nerve or tendon irritation or injury during PICC insertion. How to take care of your PICC Preventing problems  You and any caregivers should wash your hands often with soap. Wash hands: ? Before touching the PICC line or the infusion device. ? Before changing a bandage (dressing).  Flush the PICC as told by your health care provider. Let your health care provider know right away if the PICC is hard to flush or does not flush. Do not use force to flush the PICC.  Do not use a syringe that is less than 10 mL to flush the PICC.  Avoid blood pressure checks on the arm in which the PICC is placed.  Never pull or tug on the PICC.  Do not take the PICC out yourself. Only a trained clinical professional should remove the  PICC.  Use clean and sterile supplies only. Keep the supplies in a dry place. Do not reuse needles, syringes, or any other supplies. Doing that can lead to infection.  Keep pets and children away from your PICC line.  Check the PICC insertion site every day for signs of infection. Check for: ? Leakage. ? Redness, swelling, or pain. ? Fluid or blood. ? Warmth. ? Pus or a bad smell. PICC dressing care  Keep your PICC bandage (dressing) clean and dry to prevent infection.  Do not take baths, swim, or use a hot tub until your health care provider approves. Ask your health care provider if you can take showers. You may only be allowed to take sponge baths for bathing. When you are allowed to shower: ? Ask your health care provider to teach you how to wrap the PICC line. ? Cover the PICC line with clear plastic wrap and tape to keep it dry while showering.  Follow instructions from your health care provider about how to take care of your insertion site and dressing. Make sure you: ? Wash your hands with soap and water before you change your bandage (dressing). If soap and water are not available, use hand sanitizer. ? Change your dressing as told by your health care provider. ? Leave stitches (sutures), skin glue, or adhesive strips in place. These skin closures may need to stay in place for 2 weeks or longer. If adhesive strip edges start to loosen and curl up, you may trim the loose edges. Do not remove adhesive strips completely unless your health care provider tells you to do that.  Change your PICC dressing if it becomes loose or wet. General instructions  Carry your PICC identification card or wear a medical alert bracelet at all times.  Keep the tube clamped at all times, unless it is being used.  Carry a smooth-edge clamp with you at all times to place on the tube if it breaks.  Do not use scissors or sharp objects near the tube.  You may bend your arm and move it freely. If  your PICC is near or at the bend of your elbow, avoid activity with repeated motion at the elbow.  Avoid lifting heavy objects as told by your health care provider.  Keep all follow-up visits as told by your health care provider. This is important.   Disposal of supplies  Throw away any syringes in a disposal container that is meant for sharp items (sharps container). You can buy a sharps container from a pharmacy, or you can make one by using an empty hard plastic bottle with a cover.  Place any used dressings or infusion bags into a plastic bag. Throw that bag in the trash. Contact a health care provider if:  You have pain in your arm, ear, face, or teeth.  You have a fever or chills.  You have redness, swelling, or pain around the insertion site.  You have fluid or blood coming from the insertion site.  Your insertion site feels warm to the touch.  You have pus or a bad smell coming from the insertion site.  Your skin feels hard and raised around the insertion site. Get help right away if:  Your PICC is accidentally pulled all the way out. If this happens, cover the insertion site with a bandage or gauze dressing. Do not throw the PICC away. Your health care provider will need to check it.  Your PICC was tugged or pulled and  has partially come out. Do not  push the PICC back in.  You cannot flush the PICC, it is hard to flush, or the PICC leaks around the insertion site when it is flushed.  You hear a "flushing" sound when the PICC is flushed.  You feel your heart racing or skipping beats.  There is a hole or tear in the PICC.  You have swelling in the arm in which the PICC was inserted.  You have a red streak going up your arm from where the PICC was inserted. Summary  A peripherally inserted central catheter (PICC) is a long, thin, flexible tube (catheter) that is inserted into a vein in the upper arm.  The PICC is inserted using a sterile technique by a specially  trained nurse or physician. Only a trained clinical professional should remove it.  Keep your PICC identification card with you at all times.  Avoid blood pressure checks on the arm in which the PICC is placed.  If cared for properly, a PICC can remain in place for several months. Having a PICC can also allow a person to go home from the hospital sooner. This information is not intended to replace advice given to you by your health care provider. Make sure you discuss any questions you have with your health care provider. Document Revised: 11/30/2019 Document Reviewed: 11/30/2019 Elsevier Patient Education  2021 [employer].

## 2020-08-21 NOTE — Plan of Care (Signed)

## 2020-08-21 NOTE — Progress Notes (Signed)
Discharge instructions will be given (including medications), as well as, discussed with and a copy will be provided to patient/caregiver.  Awaiting discharge orders.

## 2020-08-21 NOTE — Plan of Care (Signed)
  Problem: Education: Goal: Knowledge of General Education information will improve Description Including pain rating scale, medication(s)/side effects and non-pharmacologic comfort measures Outcome: Progressing   

## 2020-08-21 NOTE — Progress Notes (Signed)
Pt stated he could place self on cpap unit when ready for bed. 

## 2020-08-22 MED ORDER — SODIUM CHLORIDE 0.9 % IV SOLN: 1.0000 g | INTRAVENOUS | 0 refills | Status: AC

## 2020-08-22 NOTE — TOC Progression Note (Addendum)
Transition of Care [facility]) - Progression Note    Patient Details  Name: [name] MRN: [id number] Date of Birth: Aug 21, 1955  Transition of Care [facility]) CM/SW Contact  Jacalyn Lefevre Edson Snowball, RN Phone Number: 08/22/2020, 10:31 AM  Clinical Narrative:     [facility]  Left  Amy a message , awaiting call back. Amy checking insurance awaiting determination Amy returned call they do not take patient's insurance.     [facility] a message , awaiting call back . Levada Dy returned call , patient would be responsible for 50 %.   Pam with [facility] cannot take due to insurance.   Mickel Baas with [facility] is in network with [facility], and has submitted for authorization. [facility] would provide home health RN also.  NCM also left a message  [facility]  to see if patient can go there for outpatient infusion. Awaiting call back. Tomi returned call. At this time it would be very difficult for patient to come to [facility] for IV ABX daily x 3 weeks. Short Stay is closed on Saturday and Sundays , ED is overwhelmed .  However if [facility] unable to service patient Tomi advised NCM to call her back and she would have to get approval from supervisor.   Hopkins with [facility] called , she is still waiting for auth from [facility] she submitted it as Urgent , hoping to hear back in a hour     There is a possibility invanz could be given IM , this would also need authorization from [facility]   Krupp with [facility] called back. Patient has 2 Land, either insurance knew the patient had the other insurance. Per Mickel Baas patient has to call both insurances and tell them [facility] is the primary. NCM told phone in to patient's room and placed Mickel Baas on speaker . Mickel Baas explained both to patient and his wife. NCM asked if Mickel Baas had a direct number for them to call. Unfortunately Mickel Baas was told patient would have to call number on insurance card and  ask for benefits.  Patient and wife are calling insurances now. Once completed NCM will notify Mickel Baas with Quentin Cornwall with [facility] updated    Mickel Baas with [facility] called back. Alinda Sierras with [facility] will call patient and discuss costs if patient agrees , patient can DC tonight and have a HHRN through [place] for tomorrows dose. Brooke aware   Patient has accepted . Patient can be discharged tonight and will have a nurse tomorrow for tomorrows dose.   Patient will need drain care. Expected Discharge Plan: [place]    Expected Discharge Plan and Services Expected Discharge Plan: [place]   Discharge Planning Services: CM Consult Post Acute Care Choice: [place] arrangements for the past 2 months: Single Family Home                   DME Agency: NA       HH Arranged: RN           Social Determinants of Health (SDOH) Interventions    Readmission Risk Interventions No flowsheet data found.

## 2020-08-22 NOTE — Progress Notes (Signed)
[facility] Progress Note     Subjective: CC-  No complaints. Abdomen still sore around drain but overall improved. Denies n/v. Tolerating diet. Afebrile. Hopes to go home today.  Objective: Vital signs in last 24 hours: Temp:  [97.6 F (36.4 C)-98.8 F (37.1 C)] 98 F (36.7 C) (01/19 0649) Pulse Rate:  [73-76] 76 (01/19 0649) Resp:  [18-19] 19 (01/19 0649) BP: (132-158)/(81-91) 142/89 (01/19 0649) SpO2:  [91 %-94 %] 94 % (01/19 0649) Last BM Date: 08/21/20  Intake/Output from previous day: 01/18 0701 - 01/19 0700 In: 250 [P.O.:240; I.V.:5] Out: 25 [Drains:25] Intake/Output this shift: No intake/output data recorded.  PE: Gen: Alert, NAD, pleasant Pulm: rate and effort normal Abd: Soft,protuberant, mild RLQ tenderness and TTP around drain without rebound or guarding, +BS. IR drain with trace feculent appearing fluid in bulb (25cc output/ 24 hr) Psych: A&Ox3  Skin: no rashes noted, warm and dry   Lab Results:  Recent Labs    08/20/20 0901 08/21/20 0105  WBC 7.9 7.4  HGB 13.0 12.7*  HCT 38.7* 38.0*  PLT 343 354   BMET Recent Labs    08/20/20 0901 08/21/20 0105  NA 137 138  K 3.5 3.9  CL 101 103  CO2 26 23  GLUCOSE 160* 98  BUN 12 11  CREATININE 0.87 0.80  CALCIUM 9.2 9.2   PT/INR No results for input(s): LABPROT, INR in the last 72 hours. CMP     Component Value Date/Time   NA 138 08/21/2020 0105   K 3.9 08/21/2020 0105   CL 103 08/21/2020 0105   CO2 23 08/21/2020 0105   GLUCOSE 98 08/21/2020 0105   BUN 11 08/21/2020 0105   CREATININE 0.80 08/21/2020 0105   CALCIUM 9.2 08/21/2020 0105   PROT 7.1 08/10/2020 2020   ALBUMIN 3.3 (L) 08/10/2020 2020   AST 27 08/10/2020 2020   ALT 25 08/10/2020 2020   ALKPHOS 60 08/10/2020 2020   BILITOT 1.3 (H) 08/10/2020 2020   GFRNONAA >60 08/21/2020 0105   GFRAA >60 10/13/2019 0539   Lipase     Component Value Date/Time   LIPASE 15 08/10/2020 2020       Studies/Results: CT ABDOMEN  PELVIS W CONTRAST  Result Date: 08/20/2020 CLINICAL DATA:  Follow-up perforated sigmoid diverticulitis and abscesses. EXAM: CT ABDOMEN AND PELVIS WITH CONTRAST TECHNIQUE: Multidetector CT imaging of the abdomen and pelvis was performed using the standard protocol following bolus administration of intravenous contrast. CONTRAST:  151mL OMNIPAQUE IOHEXOL 300 MG/ML  SOLN COMPARISON:  08/14/2020 FINDINGS: Lower Chest: No acute findings. Hepatobiliary: Moderate to severe diffuse hepatic steatosis again noted. No hepatic masses identified. Gallbladder is unremarkable. No evidence of biliary ductal dilatation. Pancreas:  No mass or inflammatory changes. Spleen: Within normal limits in size and appearance. Adrenals/Urinary Tract: No masses identified. Small bilateral renal cysts again noted. No evidence of ureteral calculi or hydronephrosis. Stomach/Bowel: Sigmoid diverticulitis has nearly completely resolved since previous study. A right lower quadrant percutaneous drainage catheter is seen in place, with resolution of previously seen diverticular abscess at this site. A small rim enhancing fluid collection is seen in the right paracolic gutter, which is slightly decreased in size, currently measuring 5.8 x 1.3 cm on image 58/3. No other abnormal fluid collections identified. No evidence of bowel obstruction. Vascular/Lymphatic: No pathologically enlarged lymph nodes. No abdominal aortic aneurysm. Aortic atherosclerotic calcification noted. 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 No masses identified. Other:  None. Musculoskeletal: No suspicious bone lesions  identified. Bilateral hip prostheses again noted. IMPRESSION: Near complete resolution of sigmoid diverticulitis since previous study. Resolution of small diverticular abscess following percutaneous catheter drainage. Slight decrease in size of small abscess in right paracolic gutter. Stable hepatic steatosis. Aortic Atherosclerosis  (ICD10-I70.0). Electronically Signed   By: Marlaine Hind M.D.   On: 08/20/2020 14:27   [place] EKG SITE RITE  Result Date: 08/21/2020 If Site Rite image not attached, placement could not be confirmed due to current cardiac rhythm.   Anti-infectives: Anti-infectives (From admission, onward)   Start     Dose/Rate Route Frequency Ordered Stop   08/21/20 1100  ertapenem (INVANZ) 1,000 mg in sodium chloride 0.9 % 100 mL IVPB        1 g 200 mL/hr over 30 Minutes Intravenous Every 24 hours 08/21/20 0937     08/17/20 1515  ceFEPIme (MAXIPIME) 2 g in sodium chloride 0.9 % 100 mL IVPB  Status:  Discontinued        2 g 200 mL/hr over 30 Minutes Intravenous Every 8 hours 08/17/20 1415 08/21/20 0937   08/11/20 0600  piperacillin-tazobactam (ZOSYN) IVPB 3.375 g  Status:  Discontinued        3.375 g 12.5 mL/hr over 240 Minutes Intravenous Every 8 hours 08/10/20 2300 08/17/20 1415   08/10/20 2130  piperacillin-tazobactam (ZOSYN) IVPB 3.375 g        3.375 g 100 mL/hr over 30 Minutes Intravenous  Once 08/10/20 2122 08/10/20 2149       Assessment/Plan OSA - on Cpap COPD - on O2 prn HTN -prns ordered 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 Followed by Dr.Katragaddapreviously. Dr. Redmond Pulling discussed with patient about recommendation for chronic anticoagulation use and risk of DVT/PE with underlying condition. Patient will discuss with Dr. Delton Coombes and think about this Acute right foot pain- Plain films negative. LE [place] as above. D/W Ortho 1/14 (see note for more details). Monitor  Diverticulitis with microperforationand abscess - s/p IR perc drain on 1/12.Output feculant and concerning for fistula. - Repeat CT 1/17 showed improved with near complete resolution of sigmoid diverticulitis, Resolution of small diverticular abscess following percutaneous  catheter drainage, and slight decrease in size of small abscess in right paracolic gutter - tolerated soft diet and having bowel function - WBC WNL (1/18), afebrile - Drain culture shows ESBL - discussed with Dr. Tommy Medal of ID and there are no oral antibiotic options. PICC placed 1/18 and he will need IV invanz after discharge.  - I spoke with case management who is working on home health options but unfortunately with the patient's insurance we are having difficulty finding an agency. Waiting to hear back. CM also looking into the option of the patient going to [facility] for infusions. Once able to arrange all this he is ready for discharge.  FEN -soft diet VTE - lovenox ID - zosyn 1/7>>1/14, cefepime 1/14>>1/18, invanz 1/18>> Follow up - IR, Dr. Marcello Moores, Dr. Delton Coombes     LOS: 12 days    [place], [facility] 08/22/2020, 9:25 AM Please see Amion for pager number during day hours 7:00am-4:30pm

## 2020-08-23 ENCOUNTER — Other Ambulatory Visit: Payer: Self-pay | Admitting: General Surgery

## 2020-08-23 DIAGNOSIS — K5792 Diverticulitis of intestine, part unspecified, without perforation or abscess without bleeding: Secondary | ICD-10-CM

## 2020-08-24 ENCOUNTER — Other Ambulatory Visit (HOSPITAL_COMMUNITY): Payer: Self-pay

## 2020-08-24 ENCOUNTER — Telehealth: Payer: Self-pay | Admitting: Pulmonary Disease

## 2020-08-24 DIAGNOSIS — I82431 Acute embolism and thrombosis of right popliteal vein: Secondary | ICD-10-CM

## 2020-08-24 NOTE — Telephone Encounter (Signed)
Pharmacy name: [facility] Drug requested: Trelegy 100 CMM?: yes Key: [id number] Covered alternatives: none listed Tried and failed: on file Decision: sent to plan.  Routing to Leigh to follow up on determination

## 2020-08-27 ENCOUNTER — Inpatient Hospital Stay (HOSPITAL_COMMUNITY): Payer: 59 | Attending: Hematology

## 2020-08-27 ENCOUNTER — Other Ambulatory Visit: Payer: Self-pay

## 2020-08-27 DIAGNOSIS — R791 Abnormal coagulation profile: Secondary | ICD-10-CM | POA: Insufficient documentation

## 2020-08-27 DIAGNOSIS — K5732 Diverticulitis of large intestine without perforation or abscess without bleeding: Secondary | ICD-10-CM | POA: Insufficient documentation

## 2020-08-27 DIAGNOSIS — I82431 Acute embolism and thrombosis of right popliteal vein: Secondary | ICD-10-CM | POA: Insufficient documentation

## 2020-08-27 DIAGNOSIS — Z8546 Personal history of malignant neoplasm of prostate: Secondary | ICD-10-CM | POA: Diagnosis not present

## 2020-08-27 DIAGNOSIS — Z87891 Personal history of nicotine dependence: Secondary | ICD-10-CM | POA: Diagnosis not present

## 2020-08-27 DIAGNOSIS — Z96641 Presence of right artificial hip joint: Secondary | ICD-10-CM | POA: Insufficient documentation

## 2020-08-27 LAB — CBC WITH DIFFERENTIAL/PLATELET
Abs Immature Granulocytes: 0.11 10*3/uL — ABNORMAL HIGH (ref 0.00–0.07)
Basophils Absolute: 0.1 10*3/uL (ref 0.0–0.1)
Basophils Relative: 1 %
Eosinophils Absolute: 0.4 10*3/uL (ref 0.0–0.5)
Eosinophils Relative: 5 %
HCT: 45.6 % (ref 39.0–52.0)
Hemoglobin: 14.4 g/dL (ref 13.0–17.0)
Immature Granulocytes: 1 %
Lymphocytes Relative: 34 %
Lymphs Abs: 2.7 10*3/uL (ref 0.7–4.0)
MCH: 30.6 pg (ref 26.0–34.0)
MCHC: 31.6 g/dL (ref 30.0–36.0)
MCV: 97 fL (ref 80.0–100.0)
Monocytes Absolute: 0.7 10*3/uL (ref 0.1–1.0)
Monocytes Relative: 9 %
Neutro Abs: 3.9 10*3/uL (ref 1.7–7.7)
Neutrophils Relative %: 50 %
Platelets: 328 10*3/uL (ref 150–400)
RBC: 4.7 MIL/uL (ref 4.22–5.81)
RDW: 13 % (ref 11.5–15.5)
WBC: 7.9 10*3/uL (ref 4.0–10.5)
nRBC: 0 % (ref 0.0–0.2)

## 2020-08-27 LAB — D-DIMER, QUANTITATIVE: D-Dimer, Quant: 2.31 ug/mL-FEU — ABNORMAL HIGH (ref 0.00–0.50)

## 2020-08-28 ENCOUNTER — Inpatient Hospital Stay (HOSPITAL_BASED_OUTPATIENT_CLINIC_OR_DEPARTMENT_OTHER): Payer: 59 | Admitting: Hematology

## 2020-08-28 ENCOUNTER — Other Ambulatory Visit: Payer: Self-pay

## 2020-08-28 VITALS — BP 128/78 | HR 82 | Temp 97.5°F | Resp 19 | Wt 223.5 lb

## 2020-08-28 DIAGNOSIS — I82431 Acute embolism and thrombosis of right popliteal vein: Secondary | ICD-10-CM

## 2020-08-28 NOTE — Progress Notes (Signed)
[name], [name]   CLINIC:  Medical Oncology/Hematology  PCP:  [name], MD [place]. / [place] [address]  [phone]  REASON FOR VISIT:  Follow-up for DVT of right lower leg  PRIOR THERAPY: Xarelto from 05/2018 to 09/2018  CURRENT THERAPY: Observation  INTERVAL HISTORY:  [name], a 65 y.o. male, returns for routine follow-up for his DVT of right lower leg. [name] was last seen on 03/14/2020.  Today he reports feeling okay. He went to [facility] on 08/10/2020 for diverticulitis and is currently wearing a PICC line and a JP drain; he drains about 5-10 cc of serosanguinous fluid daily. He is taking ertapenem by IV for 17 more days. He denies having any abnormal sensation in his calves or any SOB; he reports that he had cramping in his calf when he had the previous DVT.  He tries to be active in his home by walking around instead of sitting the entire time. He is scheduled for a CT AP and IR evaluation on 1/26.   REVIEW OF SYSTEMS:  Review of Systems  Constitutional: Positive for fatigue (50%). Negative for appetite change.  Respiratory: Positive for cough.   All other systems reviewed and are negative.   PAST MEDICAL/SURGICAL HISTORY:  Past Medical History:  Diagnosis Date  . Bilateral shoulder bursitis   . Chronic back pain   . COPD (chronic obstructive pulmonary disease) with emphysema ([place])   . DDD (degenerative disc disease), lumbar    hx epideral injection  L5--S1   . Diverticulitis   . DVT (deep venous thrombosis) ([place]) 11/19/2018  . Dysphasia    intermittant w/ food   . Dyspnea   . Dyspnea on exertion   . GERD (gastroesophageal reflux disease)   . Gout    L great toe  . Heterozygous factor V Leiden mutation ([place])   . Hiatal hernia   . Hip problem 2020   left, seeing orthopedics  . History of acute pancreatitis    alcoholic pancreatitis [id number] and 01-30-2012  .  History of colon polyps    10-17-2005  hyperplastic polyp's  . History of esophageal stricture    s/p  dilation 02-02-2017  . History of peptic ulcer 1990s  . History of pneumococcal pneumonia 2006   bilateral pneumonia w/ left lower lobe abscess treated w/ chest tube with suction for drainage  . History of pneumothorax    1984--  left spontaneous pneumothorax ,  treated w/ chest tube  . Hypertension   . Malignant neoplasm of prostate [facility]) urologist-  dr dahlstedt/  oncologist -- dr Tammi Klippel   dx 12-02-2016-- Stage T2b, Gleason 3+4,  PSA 5.99,  vol 25cc  . Numbness of left foot    outer left ankle numb-- per pt has appt. w/ neurologist  . OSA (obstructive sleep apnea)    per pt has not used cpap over year ago from 04-16-2017--- per study 01-17-2010  severe osa  . Solid nodule of lung greater than 8 mm in diameter 05/11/2018   03/2018: RUL Nodule, 62mm  . Wears glasses    Past Surgical History:  Procedure Laterality Date  . BIOPSY  02/02/2017   Procedure: BIOPSY;  Surgeon: Daneil Dolin, MD;  [facility];  Service: Endoscopy;;  duodenal bx's, esophgeal bx's  . BIOPSY  07/30/2017   Procedure: BIOPSY;  Surgeon: Daneil Dolin, MD;  [facility];  Service: Endoscopy;;  esophagus  . CARDIOVASCULAR STRESS TEST  09/12/2009   normal nuclear perfusion study w/ no ischemia /  normal LV function and wall motion , ef 57%  . COLONOSCOPY  2007   Dr. Gala Romney: hyperplastic polyps, internal hemorrhoids   . COLONOSCOPY WITH PROPOFOL N/A 05/03/2018   Dr. Gala Romney: diverticulosis in entire colon, multiple polyps in sigmoid, at splenic flexure, and ascending colon, not all polyps removed. Tubular adenomas and inflammatory polyps. Needs 3 year surveilance  . EPIDURAL BLOCK INJECTION  2020  . ESOPHAGOGASTRODUODENOSCOPY (EGD) WITH PROPOFOL N/A 02/02/2017   Dr. Gala Romney: esophageal stenosis s/p dilation and biopsy, medium sized hiatal hernia, normal duodenum  . ESOPHAGOGASTRODUODENOSCOPY (EGD)  WITH PROPOFOL N/A 07/30/2017   Dr. Gala Romney: esophageal stenosis s/p dilation and biopsy, medium-sized hiatal hernia, normal duodenum  . HEMORRHOID SURGERY  01-21-2008    [facility]  . MALONEY DILATION N/A 02/02/2017   Procedure: Venia Minks DILATION;  Surgeon: Daneil Dolin, MD;  [facility];  Service: Endoscopy;  Laterality: N/A;  . Venia Minks DILATION N/A 07/30/2017   Procedure: Venia Minks DILATION;  Surgeon: Daneil Dolin, MD;  [facility];  Service: Endoscopy;  Laterality: N/A;  . POLYPECTOMY  05/03/2018   Procedure: POLYPECTOMY;  Surgeon: Daneil Dolin, MD;  [facility];  Service: Endoscopy;;  ascending colon polyp, sigmoid colon polyps (multiple)  . RADIOACTIVE SEED IMPLANT N/A 04/23/2017   Procedure: RADIOACTIVE SEED IMPLANT/BRACHYTHERAPY IMPLANT;  Surgeon: Franchot Gallo, MD;  Location: [facility];  Service: Urology;  Laterality: N/A;  . SHOULDER ARTHROSCOPY  08/05/2003  . SPACE OAR INSTILLATION N/A 04/23/2017   Procedure: SPACE OAR INSTILLATION;  Surgeon: Franchot Gallo, MD;  Location: [facility];  Service: Urology;  Laterality: N/A;  . TRANSTHORACIC ECHOCARDIOGRAM  03/28/2009   mild LVH, ef 55-60%/ mild aorta calcification  . VASECTOMY  08/05/1983  . VIDEO ASSISTED THORACOSCOPY (VATS)/DECORTICATION Left     SOCIAL HISTORY:  Social History   Socioeconomic History  . Marital status: Married    Spouse name: Not on file  . Number of children: 2  . Years of education: Not on file  . Highest education level: Bachelor's degree (e.g., BA, AB, BS)  Occupational History  . Not on file  Tobacco Use  . Smoking status: Former Smoker    Packs/day: 1.00    Years: 30.00    Pack years: 30.00    Types: Cigarettes, E-cigarettes    Quit date: 10/03/2015    Years since quitting: 4.9  . Smokeless tobacco: Never Used  . Tobacco comment: stoped vaping 2018  Vaping Use  . Vaping Use: Former  Substance and Sexual Activity  .  Alcohol use: Yes    Alcohol/week: 21.0 standard drinks    Types: 21 Shots of liquor per week    Comment: 2-3 drinks daily (Wine); 01/04/19 pt states he hasn't had anything to drink in 2-3 months  . Drug use: No  . Sexual activity: Yes    Birth control/protection: None  Other Topics Concern  . Not on file  Social History Narrative   Lives at home with his wife   Right handed   Social Determinants of Health   Financial Resource Strain: Not on file  Food Insecurity: Not on file  Transportation Needs: Not on file  Physical Activity: Not on file  Stress: Not on file  Social Connections: Not on file  Intimate Partner Violence: Not on file    FAMILY HISTORY:  Family History  Problem  Relation Age of Onset  . Diabetes Mother   . Heart disease Father   . Heart disease Brother   . Cancer Neg Hx   . Colon cancer Neg Hx   . Colon polyps Neg Hx   . Neuropathy Neg Hx     CURRENT MEDICATIONS:  Current Outpatient Medications  Medication Sig Dispense Refill  . acetaminophen (TYLENOL) 500 MG tablet Take 2 tablets (1,000 mg total) by mouth every 6 (six) hours as needed for mild pain. 30 tablet 0  . docusate sodium (COLACE) 100 MG capsule Take 1 capsule (100 mg total) by mouth 2 (two) times daily as needed for mild constipation. 10 capsule 0  . doxycycline (VIBRAMYCIN) 50 MG capsule Take 50 mg by mouth daily.    . ertapenem 1,000 mg in sodium chloride 0.9 % 100 mL Inject 1,000 mg into the vein daily for 21 days. 21000 mg 0  . Fluticasone-Umeclidin-Vilant (TRELEGY ELLIPTA) 100-62.5-25 MCG/INH AEPB Inhale 1 puff into the lungs daily. 60 each 11  . Multiple Vitamin (MULTIVITAMIN WITH MINERALS) TABS tablet Take 1 tablet by mouth daily.    Marland Kitchen saccharomyces boulardii (FLORASTOR) 250 MG capsule Recommend taking a probiotic, you can find this over the counter    . sodium chloride flush (NS) 0.9 % SOLN Flush abdominal drain with 5cc normal saline once daily. 30 Syringe 1  . diclofenac Sodium (VOLTAREN)  1 % GEL Apply 2 g topically 4 (four) times daily as needed (pain). 50 g 0  . Ibuprofen 200 MG CAPS Take 1 capsule by mouth daily as needed (For pain).    Marland Kitchen lidocaine (LIDODERM) 5 % Place 1 patch onto the skin daily as needed (For back pain). Remove & Discard patch within 12 hours or as directed by MD    . oxyCODONE (OXY IR/ROXICODONE) 5 MG immediate release tablet Take 1-2 tablets (5-10 mg total) by mouth every 6 (six) hours as needed for severe pain. 15 tablet 0   No current facility-administered medications for this visit.    ALLERGIES:  No Known Allergies  PHYSICAL EXAM:  Performance status (ECOG): 0 - Asymptomatic  Vitals:   08/28/20 1542  BP: 128/78  Pulse: 82  Resp: 19  Temp: (!) 97.5 F (36.4 C)  SpO2: 98%   Wt Readings from Last 3 Encounters:  08/28/20 223 lb 8 oz (101.4 kg)  08/10/20 229 lb 15 oz (104.3 kg)  05/23/20 230 lb (104.3 kg)   Physical Exam Vitals reviewed.  Constitutional:      Appearance: Normal appearance.  Cardiovascular:     Rate and Rhythm: Normal rate and regular rhythm.     Pulses: Normal pulses.     Heart sounds: Normal heart sounds.  Pulmonary:     Effort: Pulmonary effort is normal.     Breath sounds: Normal breath sounds.  Neurological:     General: No focal deficit present.     Mental Status: He is alert and oriented to person, place, and time.  Psychiatric:        Mood and Affect: Mood normal.        Behavior: Behavior normal.     LABORATORY DATA:  I have reviewed the labs as listed.  CBC Latest Ref Rng & Units 08/27/2020 08/21/2020 08/20/2020  WBC 4.0 - 10.5 K/uL 7.9 7.4 7.9  Hemoglobin 13.0 - 17.0 g/dL 14.4 12.7(L) 13.0  Hematocrit 39.0 - 52.0 % 45.6 38.0(L) 38.7(L)  Platelets 150 - 400 K/uL 328 354 343   CMP Latest Ref  Rng & Units 08/21/2020 08/20/2020 08/18/2020  Glucose 70 - 99 mg/dL 98 160(H) 110(H)  BUN 8 - 23 mg/dL 11 12 8   Creatinine 0.61 - 1.24 mg/dL 0.80 0.87 0.84  Sodium 135 - 145 mmol/L 138 137 142  Potassium 3.5 -  5.1 mmol/L 3.9 3.5 3.8  Chloride 98 - 111 mmol/L 103 101 107  CO2 22 - 32 mmol/L 23 26 27   Calcium 8.9 - 10.3 mg/dL 9.2 9.2 8.8(L)  Total Protein 6.5 - 8.1 g/dL - - -  Total Bilirubin 0.3 - 1.2 mg/dL - - -  Alkaline Phos 38 - 126 U/L - - -  AST 15 - 41 U/L - - -  ALT 0 - 44 U/L - - -      Component Value Date/Time   RBC 4.70 08/27/2020 0920   MCV 97.0 08/27/2020 0920   MCH 30.6 08/27/2020 0920   MCHC 31.6 08/27/2020 0920   RDW 13.0 08/27/2020 0920   LYMPHSABS 2.7 08/27/2020 0920   MONOABS 0.7 08/27/2020 0920   EOSABS 0.4 08/27/2020 0920   BASOSABS 0.1 08/27/2020 0920    DIAGNOSTIC IMAGING:  I have independently reviewed the scans and discussed with the patient. CT ABDOMEN PELVIS WO CONTRAST  Result Date: 08/10/2020 CLINICAL DATA:  Left lower quadrant pain EXAM: CT ABDOMEN AND PELVIS WITHOUT CONTRAST TECHNIQUE: Multidetector CT imaging of the abdomen and pelvis was performed following the standard protocol without IV contrast. COMPARISON:  10/10/2019 FINDINGS: Lower chest: No acute abnormality. Hepatobiliary: Diffuse steatosis. Punctate calcified granuloma. Gallbladder is unremarkable. No biliary dilatation. Pancreas: Unremarkable. Spleen: Unremarkable. Adrenals/Urinary Tract: The adrenals are unremarkable. Small bilateral renal cysts are again identified. Bladder is partially obscured by artifact but otherwise unremarkable. Stomach/Bowel: Small hiatal hernia. Distal colonic diverticulosis. There is infiltration of the adjacent fat in the pelvis on the right with minimal ill-defined fluid. There are a few small foci of likely extraluminal air in this region (series 6, images 73 and 76). Minimal pneumoperitoneum is seen along the right hemidiaphragm. There is no evidence of abscess formation. Vascular/Lymphatic: Aortic atherosclerosis. No enlarged lymph nodes. Small nonenlarged retroperitoneal nodes are noted. Reproductive: Obscured by artifact.  Prostate seeds are present. Other: Unchanged  umbilical hernia with protrusion of a small segment of nonobstructed small bowel. Musculoskeletal: No acute osseous abnormality. IMPRESSION: Acute sigmoid diverticulitis with small perforation. No evidence of abscess. Hepatic steatosis. Small hiatal hernia. These results were called by telephone at the time of interpretation on 08/10/2020 at 8:48 pm to provider ADAM CURATOLO , who verbally acknowledged these results. Electronically Signed   By: Macy Mis M.D.   On: 08/10/2020 20:51   DG Tibia/Fibula Right  Result Date: 08/17/2020 CLINICAL DATA:  65 year old male with right lower extremity pain swelling and redness. EXAM: RIGHT TIBIA AND FIBULA - 2 VIEW COMPARISON:  Right foot series today. FINDINGS: Visible osseous structures are intact. Preserved alignment at the right knee with mild degenerative changes. No knee joint effusion. Evidence of widespread soft tissue swelling/stranding. No soft tissue gas. No radiopaque foreign body identified. IMPRESSION: Widespread soft tissue swelling/stranding. No acute osseous abnormality identified. Electronically Signed   By: Genevie Ann M.D.   On: 08/17/2020 10:13   CT ABDOMEN PELVIS W CONTRAST  Result Date: 08/20/2020 CLINICAL DATA:  Follow-up perforated sigmoid diverticulitis and abscesses. EXAM: CT ABDOMEN AND PELVIS WITH CONTRAST TECHNIQUE: Multidetector CT imaging of the abdomen and pelvis was performed using the standard protocol following bolus administration of intravenous contrast. CONTRAST:  152mL OMNIPAQUE IOHEXOL 300 MG/ML  SOLN COMPARISON:  08/14/2020 FINDINGS: Lower Chest: No acute findings. Hepatobiliary: Moderate to severe diffuse hepatic steatosis again noted. No hepatic masses identified. Gallbladder is unremarkable. No evidence of biliary ductal dilatation. Pancreas:  No mass or inflammatory changes. Spleen: Within normal limits in size and appearance. Adrenals/Urinary Tract: No masses identified. Small bilateral renal cysts again noted. No evidence of  ureteral calculi or hydronephrosis. Stomach/Bowel: Sigmoid diverticulitis has nearly completely resolved since previous study. A right lower quadrant percutaneous drainage catheter is seen in place, with resolution of previously seen diverticular abscess at this site. A small rim enhancing fluid collection is seen in the right paracolic gutter, which is slightly decreased in size, currently measuring 5.8 x 1.3 cm on image 58/3. No other abnormal fluid collections identified. No evidence of bowel obstruction. Vascular/Lymphatic: No pathologically enlarged lymph nodes. No abdominal aortic aneurysm. Aortic atherosclerotic calcification noted. 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 No masses identified. Other:  None. Musculoskeletal: No suspicious bone lesions identified. Bilateral hip prostheses again noted. IMPRESSION: Near complete resolution of sigmoid diverticulitis since previous study. Resolution of small diverticular abscess following percutaneous catheter drainage. Slight decrease in size of small abscess in right paracolic gutter. Stable hepatic steatosis. Aortic Atherosclerosis (ICD10-I70.0). Electronically Signed   By: Marlaine Hind M.D.   On: 08/20/2020 14:27   CT ABDOMEN PELVIS W CONTRAST  Result Date: 08/14/2020 CLINICAL DATA:  Follow-up diverticulitis. EXAM: CT ABDOMEN AND PELVIS WITH CONTRAST TECHNIQUE: Multidetector CT imaging of the abdomen and pelvis was performed using the standard protocol following bolus administration of intravenous contrast. CONTRAST:  146mL OMNIPAQUE IOHEXOL 300 MG/ML  SOLN COMPARISON:  CT 4 days ago 08/10/2020 FINDINGS: Lower chest: Small right pleural effusion has developed since prior exam. Adjacent compressive atelectasis. Minor left lower lobe atelectasis. There are coronary artery calcifications. Hepatobiliary: Enlarged liver spanning 25.2 cm cranial caudal with steatosis. Distended gallbladder. No calcified gallstone. No biliary  dilatation. Pancreas: Punctate calcification in the pancreatic head. No ductal dilatation or inflammation. Spleen: Upper normal spanning 13.1 cm cranial caudal. No focal abnormality. Adrenals/Urinary Tract: Normal adrenal glands. No hydronephrosis or perinephric edema. Homogeneous renal enhancement with symmetric excretion on delayed phase imaging. 2.5 cm parapelvic cyst in the mid right kidney. 2.1 cm simple cyst in the upper left kidney. Urinary bladder is only minimally distended, primarily obscured by streak artifact from bilateral hip arthroplasties. Stomach/Bowel: Redundant sigmoid colon with perforated diverticulitis involving the proximal sigmoid in the pelvis just to the right of midline again demonstrated. There is a new pericolonic air fluid collection that measures 4.0 x 3.4 x 3.1 cm, series 3, image 75. Small fluid collection just anterior to the right iliac vessels and posterior to the cecum measures 3.1 x 2.5 x 3.6 cm, series 3, image 68. Crescentic fluid collection in the right pericolic gutter abutting the inferior tip of the liver measures 6.7 x 2.0 x 2.2 cm, series 3, image 59. Additional ill-defined fluid and stranding in the pelvis and right pericolic gutter. Administered enteric contrast reaches the proximal transverse colon. There is no extraluminal contrast, however contrast does not reach the site of perforation in the sigmoid. Multiple additional noninflamed diverticula without additional sites of acute diverticulitis. Small hiatal hernia. No small bowel obstruction, inflammation, or ileus. Vascular/Lymphatic: Multiple small lymph nodes scattered throughout both the central and distal mesentery. For example 6 mm node series 3, image 66 centrally. There multiple periportal and retroperitoneal nodes. No evidence of portal or mesenteric vein thrombosis. Aorto bi-iliac atherosclerosis. Reproductive: Brachytherapy  seeds in the prostate, otherwise obscured by streak artifact from hip  arthroplasties. Other: Pelvic and right side of fluid collections as described above. Foci of pneumoperitoneum in the upper abdomen have improved. There is a small fat containing umbilical hernia, with increased edema in the adjacent periumbilical fat. Musculoskeletal: Bilateral hip arthroplasties. There are no acute or suspicious osseous abnormalities. IMPRESSION: 1. Redemonstrated perforated sigmoid diverticulitis with new pericolonic and right-sided collections consistent with abscess. Three collections are noted in the pelvis and right pericolic gutter, largest inferior to the liver tip measures 6.7 x 2 x 2.0 cm. 2. Small right pleural effusion has developed since prior exam. 3. Hepatomegaly and hepatic steatosis. 4. Small fat containing umbilical hernia, with increased edema in the adjacent periumbilical fat. 5. Multiple small lymph nodes scattered throughout the central and distal mesentery, likely reactive. Aortic Atherosclerosis (ICD10-I70.0). Electronically Signed   By: Keith Rake M.D.   On: 08/14/2020 20:05   DG Foot Complete Right  Result Date: 08/17/2020 CLINICAL DATA:  65 year old male with right lower extremity pain swelling and redness. EXAM: RIGHT FOOT COMPLETE - 3+ VIEW COMPARISON:  No prior radiographs. FINDINGS: Evidence of generalized soft tissue swelling. No soft tissue gas. No radiopaque foreign body identified. Evidence of healed prior fracture at the base of the right 5th metatarsal. No acute osseous abnormality identified. IMPRESSION: Soft tissue swelling with no acute osseous abnormality identified. Electronically Signed   By: Genevie Ann M.D.   On: 08/17/2020 10:11   CT IMAGE GUIDED DRAINAGE BY PERCUTANEOUS CATHETER  Result Date: 08/15/2020 INDICATION: 65 year old male with history of acute diverticulitis complicated by abscess formation in the right lower quadrant. EXAM: CT IMAGE GUIDED DRAINAGE BY PERCUTANEOUS CATHETER COMPARISON:  CT abdomen pelvis from 08/14/2020 MEDICATIONS:  The patient is currently admitted to the hospital and receiving intravenous antibiotics. The antibiotics were administered within an appropriate time frame prior to the initiation of the procedure. ANESTHESIA/SEDATION: Moderate (conscious) sedation was employed during this procedure. A total of Versed 1 mg and Fentanyl 25 mcg was administered intravenously. Moderate Sedation Time: 14 minutes. The patient's level of consciousness and vital signs were monitored continuously by radiology nursing throughout the procedure under my direct supervision. CONTRAST:  None COMPLICATIONS: None immediate. PROCEDURE: Informed written consent was obtained from the patient after a discussion of the risks, benefits and alternatives to treatment. The patient was placed supine on the CT gantry and a pre procedural CT was performed re-demonstrating the known abscess/fluid collection within the right lower quadrant. The procedure was planned. A timeout was performed prior to the initiation of the procedure. The right lower quadrant was prepped and draped in the usual sterile fashion. The overlying soft tissues were anesthetized with 1% lidocaine with epinephrine. Appropriate trajectory was planned with the use of a 22 gauge spinal needle. An 18 gauge trocar needle was advanced into the abscess/fluid collection and a short Amplatz super stiff wire was coiled within the collection. Appropriate positioning was confirmed with a limited CT scan. The tract was serially dilated allowing placement of a 10.2 [place] all-purpose drainage catheter. Appropriate positioning was confirmed with a limited postprocedural CT scan. Approximately 10 ml of purulent fluid was aspirated. The tube was connected to a bulb suction and sutured in place. A dressing was placed. The patient tolerated the procedure well without immediate post procedural complication. IMPRESSION: Successful CT guided placement of a 10.2 [place] all purpose drain catheter into the right  lower quadrant diverticular abscess with aspiration of approximately 10 mL of purulent  fluid. Samples were sent to the laboratory as requested by the ordering clinical team. Ruthann Cancer, MD [facility] Electronically Signed   By: Ruthann Cancer MD   On: 08/15/2020 17:36   VAS [place] LOWER EXTREMITY VENOUS (DVT)  Result Date: 08/20/2020  Lower Venous DVT Study Indications: Pain, and Edema.  Comparison Study: no prior Performing Technologist: Abram Sander RVS  Examination Guidelines: A complete evaluation includes B-mode imaging, spectral Doppler, color Doppler, and power Doppler as needed of all accessible portions of each vessel. Bilateral testing is considered an integral part of a complete examination. Limited examinations for reoccurring indications may be performed as noted. The reflux portion of the exam is performed with the patient in reverse Trendelenburg.  +---------+---------------+---------+-----------+----------+-----------------+ RIGHT    CompressibilityPhasicitySpontaneityPropertiesThrombus Aging    +---------+---------------+---------+-----------+----------+-----------------+ CFV      Full           Yes      Yes                                    +---------+---------------+---------+-----------+----------+-----------------+ SFJ      Full                                                           +---------+---------------+---------+-----------+----------+-----------------+ FV Prox  Full                                                           +---------+---------------+---------+-----------+----------+-----------------+ FV Mid   Full                                                           +---------+---------------+---------+-----------+----------+-----------------+ FV DistalFull                                                            +---------+---------------+---------+-----------+----------+-----------------+ PFV      Full                                                           +---------+---------------+---------+-----------+----------+-----------------+ POP      Partial        Yes      Yes                  Age Indeterminate +---------+---------------+---------+-----------+----------+-----------------+ PTV      Full                                                           +---------+---------------+---------+-----------+----------+-----------------+  PERO     Full                                                           +---------+---------------+---------+-----------+----------+-----------------+   +----+---------------+---------+-----------+----------+--------------+ LEFTCompressibilityPhasicitySpontaneityPropertiesThrombus Aging +----+---------------+---------+-----------+----------+--------------+ CFV Full           Yes      Yes                                 +----+---------------+---------+-----------+----------+--------------+     Summary: RIGHT: - Findings consistent with age indeterminate deep vein thrombosis involving the right popliteal vein. - No cystic structure found in the popliteal fossa.  LEFT: - No evidence of common femoral vein obstruction.  *See table(s) above for measurements and observations. Electronically signed by Servando Snare MD on 08/20/2020 at 7:25:24 AM.    Final    [place] EKG SITE RITE  Result Date: 08/21/2020 If Site Rite image not attached, placement could not be confirmed due to current cardiac rhythm.    ASSESSMENT:  1. Weakly provoked right leg DVT: -Xarelto from October 2019 through February 2020. -He is also heterozygous for factor V Leiden. -He has mildly elevated D-dimer of around 2.3 since he discontinued anticoagulation. -Lower extremity Doppler on 05/31/2019 was negative.  2.  Prostate cancer: -TRUS/biopsy on 12/02/2016.  4 mm left apical nodule,  PSA 5.99.  6/12 cores positive for adenocarcinoma, all on the left side of the prostate.  3 cores revealed GS 3+3, 3 cores with GS 3+4.  He underwent I-125 brachytherapy on 04/23/2017. -Latest PSA on 01/10/2020 was less than 0.1.   PLAN:  1. Weakly provoked right leg DVT: -He was recently hospitalized from 08/10/2020 through 08/22/2020 with sigmoid diverticulitis with microperforation. -He has a drain in the right lower quadrant.  He is also receiving IV ertapenem for 17 more days. -His D-dimer on 08/27/2020 was 2.31. -Lower extremity Doppler on 08/17/2020 showed age-indeterminate DVT involving the right popliteal vein. -He does not have any swelling of the right leg or cramping which he had at presentation in 2019. -He continued to have elevated D-dimer throughout the last 3 years. -Because of his drain and possibility of bleeding, I did not recommend any anticoagulation at this time.  I have recommended repeating the Doppler in 3 to [redacted] weeks along with D-dimer. -He was told to go to the ER should he develop any leg pain or breathing difficulty or chest pains. -He is agreeable with the plan.  Orders placed this encounter:  No orders of the defined types were placed in this encounter.    Derek Jack, MD [facility] [phone]   I, Milinda Antis, am acting as a scribe for Dr. Sanda Linger.  I, Derek Jack MD, have reviewed the above documentation for accuracy and completeness, and I agree with the above.

## 2020-08-28 NOTE — Patient Instructions (Signed)
[place] at [facility] Discharge Instructions  You were seen today by Dr. Delton Coombes. He went over your recent results and scans. You will be scheduled for an ultrasound of your right leg before your next visit. Dr. Delton Coombes will see you back in 4 weeks for labs and follow up.   Thank you for choosing [place] at [facility] to provide your oncology and hematology care.  To afford each patient quality time with our provider, please arrive at least 15 minutes before your scheduled appointment time.   If you have a lab appointment with the [place] please come in thru the Main Entrance and check in at the main information desk  You need to re-schedule your appointment should you arrive 10 or more minutes late.  We strive to give you quality time with our providers, and arriving late affects you and other patients whose appointments are after yours.  Also, if you no show three or more times for appointments you may be dismissed from the clinic at the providers discretion.     Again, thank you for choosing [facility].  Our hope is that these requests will decrease the amount of time that you wait before being seen by our physicians.       _____________________________________________________________  Should you have questions after your visit to [facility], please contact our office at [phone] between the hours of 8:00 a.m. and 4:30 p.m.  Voicemails left after 4:00 p.m. will not be returned until the following business day.  For prescription refill requests, have your pharmacy contact our office and allow 72 hours.    Cancer Center Support Programs:   > Cancer Support Group  2nd Tuesday of the month 1pm-2pm, Journey Room

## 2020-08-29 ENCOUNTER — Ambulatory Visit
Admission: RE | Admit: 2020-08-29 | Discharge: 2020-08-29 | Disposition: A | Discharge status: Home / Self Care | Payer: PRIVATE HEALTH INSURANCE | Source: Ambulatory Visit | Attending: Radiology | Admitting: Radiology

## 2020-08-29 ENCOUNTER — Ambulatory Visit
Admission: RE | Admit: 2020-08-29 | Discharge: 2020-08-29 | Disposition: A | Discharge status: Home / Self Care | Payer: PRIVATE HEALTH INSURANCE | Source: Ambulatory Visit | Attending: General Surgery | Admitting: General Surgery

## 2020-08-29 ENCOUNTER — Encounter: Payer: Self-pay | Admitting: Radiology

## 2020-08-29 DIAGNOSIS — K5792 Diverticulitis of intestine, part unspecified, without perforation or abscess without bleeding: Secondary | ICD-10-CM

## 2020-08-29 HISTORY — PX: IR RADIOLOGIST EVAL & MGMT: IMG5224

## 2020-08-29 MED ORDER — IOPAMIDOL (ISOVUE-300) INJECTION 61%
100.0000 mL | Freq: Once | INTRAVENOUS | Status: AC | PRN
  Administered 2020-08-29: 100 mL via INTRAVENOUS

## 2020-08-29 NOTE — Progress Notes (Signed)
Referring Physician(s): Dr Joyice Faster  Chief Complaint:  Acute diverticulitis complicated by abscess formation in the right lower quadrant  The patient is seen in follow up today s/p Successful CT guided placement of a 10.2 [place] all purpose drain catheter into the right lower quadrant diverticular abscess 08/15/20  History of present illness:  Scheduled today for CT and evaluation of abscess drain. He has done well since placement 08/15/20 Denies pain Denies N/V Denies fever chills Flushes daily 5-10 cc saline OP is 5-10 cc daily  To see Dr Marcello Moores 09/03/20 To remain on IV Ivanz 2 more weeks  Past Medical History:  Diagnosis Date  . Bilateral shoulder bursitis   . Chronic back pain   . COPD (chronic obstructive pulmonary disease) with emphysema ([place])   . DDD (degenerative disc disease), lumbar    hx epideral injection  L5--S1   . Diverticulitis   . DVT (deep venous thrombosis) () 11/19/2018  . Dysphasia    intermittant w/ food   . Dyspnea   . Dyspnea on exertion   . GERD (gastroesophageal reflux disease)   . Gout    L great toe  . Heterozygous factor V Leiden mutation ([place])   . Hiatal hernia   . Hip problem 2020   left, seeing orthopedics  . History of acute pancreatitis    alcoholic pancreatitis [id number] and 01-30-2012  . History of colon polyps    10-17-2005  hyperplastic polyp's  . History of esophageal stricture    s/p  dilation 02-02-2017  . History of peptic ulcer 1990s  . History of pneumococcal pneumonia 2006   bilateral pneumonia w/ left lower lobe abscess treated w/ chest tube with suction for drainage  . History of pneumothorax    1984--  left spontaneous pneumothorax ,  treated w/ chest tube  . Hypertension   . Malignant neoplasm of prostate [facility]) urologist-  dr dahlstedt/  oncologist -- dr Tammi Klippel   dx 12-02-2016-- Stage T2b, Gleason 3+4,  PSA 5.99,  vol 25cc  . Numbness of left foot    outer left ankle numb-- per pt has appt. w/  neurologist  . OSA (obstructive sleep apnea)    per pt has not used cpap over year ago from 04-16-2017--- per study 01-17-2010  severe osa  . Solid nodule of lung greater than 8 mm in diameter 05/11/2018   03/2018: RUL Nodule, 30mm  . Wears glasses     Past Surgical History:  Procedure Laterality Date  . BIOPSY  02/02/2017   Procedure: BIOPSY;  Surgeon: Daneil Dolin, MD;  [facility];  Service: Endoscopy;;  duodenal bx's, esophgeal bx's  . BIOPSY  07/30/2017   Procedure: BIOPSY;  Surgeon: Daneil Dolin, MD;  [facility];  Service: Endoscopy;;  esophagus  . CARDIOVASCULAR STRESS TEST  09/12/2009   normal nuclear perfusion study w/ no ischemia /  normal LV function and wall motion , ef 57%  . COLONOSCOPY  2007   Dr. Gala Romney: hyperplastic polyps, internal hemorrhoids   . COLONOSCOPY WITH PROPOFOL N/A 05/03/2018   Dr. Gala Romney: diverticulosis in entire colon, multiple polyps in sigmoid, at splenic flexure, and ascending colon, not all polyps removed. Tubular adenomas and inflammatory polyps. Needs 3 year surveilance  . EPIDURAL BLOCK INJECTION  2020  . ESOPHAGOGASTRODUODENOSCOPY (EGD) WITH PROPOFOL N/A 02/02/2017   Dr. Gala Romney: esophageal stenosis s/p dilation and biopsy, medium sized hiatal hernia, normal duodenum  . ESOPHAGOGASTRODUODENOSCOPY (EGD) WITH PROPOFOL N/A 07/30/2017   Dr.  Rourk: esophageal stenosis s/p dilation and biopsy, medium-sized hiatal hernia, normal duodenum  . HEMORRHOID SURGERY  01-21-2008    [facility]  . IR RADIOLOGIST EVAL & MGMT  08/29/2020  . MALONEY DILATION N/A 02/02/2017   Procedure: Venia Minks DILATION;  Surgeon: Daneil Dolin, MD;  [facility];  Service: Endoscopy;  Laterality: N/A;  . Venia Minks DILATION N/A 07/30/2017   Procedure: Venia Minks DILATION;  Surgeon: Daneil Dolin, MD;  [facility];  Service: Endoscopy;  Laterality: N/A;  . POLYPECTOMY  05/03/2018   Procedure: POLYPECTOMY;  Surgeon: Daneil Dolin, MD;  [facility];  Service: Endoscopy;;  ascending colon polyp, sigmoid colon polyps (multiple)  . RADIOACTIVE SEED IMPLANT N/A 04/23/2017   Procedure: RADIOACTIVE SEED IMPLANT/BRACHYTHERAPY IMPLANT;  Surgeon: Franchot Gallo, MD;  Location: [facility];  Service: Urology;  Laterality: N/A;  . SHOULDER ARTHROSCOPY  08/05/2003  . SPACE OAR INSTILLATION N/A 04/23/2017   Procedure: SPACE OAR INSTILLATION;  Surgeon: Franchot Gallo, MD;  [facility];  Service: Urology;  Laterality: N/A;  . TRANSTHORACIC ECHOCARDIOGRAM  03/28/2009   mild LVH, ef 55-60%/ mild aorta calcification  . VASECTOMY  08/05/1983  . VIDEO ASSISTED THORACOSCOPY (VATS)/DECORTICATION Left     Allergies: Patient has no known allergies.  Medications: Prior to Admission medications   Medication Sig Start Date End Date Taking? Authorizing Provider  acetaminophen (TYLENOL) 500 MG tablet Take 2 tablets (1,000 mg total) by mouth every 6 (six) hours as needed for mild pain. 08/21/20   Meuth, Brooke A, PA-C  diclofenac Sodium (VOLTAREN) 1 % GEL Apply 2 g topically 4 (four) times daily as needed (pain). 08/21/20   Meuth, Brooke A, PA-C  docusate sodium (COLACE) 100 MG capsule Take 1 capsule (100 mg total) by mouth 2 (two) times daily as needed for mild constipation. 08/21/20   Meuth, Brooke A, PA-C  doxycycline (VIBRAMYCIN) 50 MG capsule Take 50 mg by mouth daily. 08/26/20   [provider]  ertapenem 1,000 mg in sodium chloride 0.9 % 100 mL Inject 1,000 mg into the vein daily for 21 days. 08/22/20 09/12/20  Meuth, Blaine Hamper, PA-C  Fluticasone-Umeclidin-Vilant (TRELEGY ELLIPTA) 100-62.5-25 MCG/INH AEPB Inhale 1 puff into the lungs daily. 08/16/19   Garner Nash, DO  Ibuprofen 200 MG CAPS Take 1 capsule by mouth daily as needed (For pain).    [provider]  lidocaine (LIDODERM) 5 % Place 1 patch onto the skin daily as needed (For back pain). Remove & Discard patch within 12 hours or  as directed by MD    [provider]  Multiple Vitamin (MULTIVITAMIN WITH MINERALS) TABS tablet Take 1 tablet by mouth daily. 08/22/20   Meuth, Brooke A, PA-C  oxyCODONE (OXY IR/ROXICODONE) 5 MG immediate release tablet Take 1-2 tablets (5-10 mg total) by mouth every 6 (six) hours as needed for severe pain. 08/21/20   Meuth, Blaine Hamper, PA-C  saccharomyces boulardii (FLORASTOR) 250 MG capsule Recommend taking a probiotic, you can find this over the counter 08/21/20   Meuth, Brooke A, PA-C  sodium chloride flush (NS) 0.9 % SOLN Flush abdominal drain with 5cc normal saline once daily. 08/21/20   Meuth, Brooke A, PA-C  albuterol (PROVENTIL) (2.5 MG/3ML) 0.083% nebulizer solution Take 3 mLs (2.5 mg total) by nebulization every 6 (six) hours as needed for wheezing or shortness of breath. Patient not taking: Reported on 03/14/2020 03/22/18 05/23/20  Garner Nash, DO     Family  History  Problem Relation Age of Onset  . Diabetes Mother   . Heart disease Father   . Heart disease Brother   . Cancer Neg Hx   . Colon cancer Neg Hx   . Colon polyps Neg Hx   . Neuropathy Neg Hx     Social History   Socioeconomic History  . Marital status: Married    Spouse name: Not on file  . Number of children: 2  . Years of education: Not on file  . Highest education level: Bachelor's degree (e.g., BA, AB, BS)  Occupational History  . Not on file  Tobacco Use  . Smoking status: Former Smoker    Packs/day: 1.00    Years: 30.00    Pack years: 30.00    Types: Cigarettes, E-cigarettes    Quit date: 10/03/2015    Years since quitting: 4.9  . Smokeless tobacco: Never Used  . Tobacco comment: stoped vaping 2018  Vaping Use  . Vaping Use: Former  Substance and Sexual Activity  . Alcohol use: Yes    Alcohol/week: 21.0 standard drinks    Types: 21 Shots of liquor per week    Comment: 2-3 drinks daily (Wine); 01/04/19 pt states he hasn't had anything to drink in 2-3 months  . Drug use: No  . Sexual  activity: Yes    Birth control/protection: None  Other Topics Concern  . Not on file  Social History Narrative   Lives at home with his wife   Right handed   Social Determinants of Health   Financial Resource Strain: Not on file  Food Insecurity: Not on file  Transportation Needs: Not on file  Physical Activity: Not on file  Stress: Not on file  Social Connections: Not on file     Vital Signs: BP 128/80   Pulse 79   Temp 98.1 F (36.7 C)   SpO2 98%   Physical Exam Skin:    General: Skin is warm.     Comments: Site is clean and dry NT no bleeding No sign of infection OP thin yellow fluid in JP  Injection today shows No abscess collection No communication to bowel per Dr Stephenie Acres removed without complication Dressing placed      Imaging: CT ABDOMEN PELVIS W CONTRAST  Result Date: 08/29/2020 CLINICAL DATA:  65 year old male with a history of prior diverticulitis, drainage performed 08/15/2020. EXAM: CT ABDOMEN AND PELVIS WITH CONTRAST TECHNIQUE: Multidetector CT imaging of the abdomen and pelvis was performed using the standard protocol following bolus administration of intravenous contrast. CONTRAST:  176mL ISOVUE-300 IOPAMIDOL (ISOVUE-300) INJECTION 61% COMPARISON:  08/14/2020, 08/20/2020, most remote CT reviewed 11/12/2011 and 02/21/2009 FINDINGS: Lower chest: Atelectasis/scarring of the dependent lung bases. No acute finding of the lower lungs. Hepatobiliary: Similar appearance of left liver lobe atrophy/hypoplasia, unchanged over the course of the most recent comparison is a and new from CT study of 02/21/2009. Note that there is a small left portal vein on the current study, normal previously. There is geographic hypoperfusion within the left liver and segment 4, which is more pronounced than the prior and likely secondary to the phase of the CT acquisition. Given the small/atrophic left portal vein, a prior portal venous thrombosis/infarction is favored as the  cause of left liver atrophy. Unremarkable gallbladder. Pancreas: Unremarkable pancreas Spleen: Unremarkable Adrenals/Urinary Tract: - Right adrenal gland:  Unremarkable - Left adrenal gland: Unremarkable. - Right kidney: No hydronephrosis, nephrolithiasis, inflammation, or ureteral dilation. Low-density lesion at the inferior right kidney,  present on the study of 2010 and now measuring 26 mm, only slightly larger with no internal complexity. This is most compatible with a simple cyst. - Left Kidney: No hydronephrosis, nephrolithiasis, inflammation, or ureteral dilation. Low-density rounded lesion on the anterior cortex of the left kidney, 20 mm. Unchanged from most recent study of most compatible with simple cyst. - Urinary Bladder: Urinary bladder is not well evaluated given the degree of streak artifact in the pelvis. Appears relatively decompressed. Stomach/Bowel: - Stomach: Hiatal hernia, otherwise unremarkable - Small bowel: Unremarkable - Appendix: Normal. - Colon: Colonic diverticular disease throughout the length of the colon. No significant wall thickening to indicate acute inflammation. Compared to the most recent comparison CT studies, there has been resolution of the inflammatory changes and fluid in the right lower quadrant. Percutaneous drainage catheter terminates within the right lower quadrant, partially withdrawn compared to the prior. The radiopaque side marker projects at the level of the abdominal wall musculature. No residual abscess or fluid. Vascular/Lymphatic: Atherosclerotic changes of the abdominal aorta. No dissection. No aneurysm. No periaortic fluid. Bilateral iliac and proximal femoral arteries are patent. Unremarkable venous structures. Main portal vein is patent. Right portal vein patent. Left portal vein appears atrophic, new from 2010. Reproductive: Brachytherapy seeds of the prostate. Other: Small fat containing umbilical hernia. Musculoskeletal: No acute displaced fracture.  Degenerative changes of the spine. Surgical changes bilateral hip arthroplasty contributing to significant streak artifact of the pelvis. IMPRESSION: Pigtail drainage catheter within the right lower quadrant, with no residual abscess and with resolving inflammatory changes of the prior diverticulitis. The radiopaque marker is at the level of the abdominal wall musculature, partially withdrawn from the placement. Diverticular disease without evidence of ongoing/recurrent acute changes. Since 2010 there has been development of atrophic left liver, most likely secondary to a remote left portal vein thrombus given the appearance and in the absence of surgical changes. This most likely accounts for the enlarged right liver on the cranial caudal dimension without overt signs of cirrhosis. Additional ancillary findings as above. Electronically Signed   By: Corrie Mckusick D.O.   On: 08/29/2020 12:50   IR Radiologist Eval & Mgmt  Result Date: 08/29/2020 Please refer to notes tab for details about interventional procedure. (Op Note)   Labs:  CBC: Recent Labs    08/18/20 0110 08/20/20 0901 08/21/20 0105 08/27/20 0920  WBC 8.1 7.9 7.4 7.9  HGB 11.2* 13.0 12.7* 14.4  HCT 34.7* 38.7* 38.0* 45.6  PLT 252 343 354 328    COAGS: Recent Labs    08/15/20 0613  INR 1.2    BMP: Recent Labs    10/10/19 0544 10/11/19 0537 10/12/19 0434 10/13/19 0539 08/10/20 2020 08/15/20 0032 08/18/20 0110 08/20/20 0901 08/21/20 0105  NA 139 135 135 136   < > 137 142 137 138  K 4.1 3.8 3.5 3.5   < > 4.1 3.8 3.5 3.9  CL 99 100 101 103   < > 98 107 101 103  CO2 26 26 26 27    < > 28 27 26 23   GLUCOSE 151* 137* 130* 112*   < > 83 110* 160* 98  BUN 16 17 13 13    < > 6* 8 12 11   CALCIUM 9.3 8.5* 8.5* 8.1*   < > 9.0 8.8* 9.2 9.2  CREATININE 0.94 0.81 0.78 0.74   < > 0.83 0.84 0.87 0.80  GFRNONAA >60 >60 >60 >60   < > >60 >60 >60 >60  GFRAA >60 >60 >60 >  60  --   --   --   --   --    < > = values in this interval  not displayed.    LIVER FUNCTION TESTS: Recent Labs    10/10/19 0544 10/11/19 0537 10/13/19 0539 08/10/20 2020  BILITOT 0.8 1.2 1.0 1.3*  AST 28 30 22 27   ALT 26 19 17 25   ALKPHOS 79 56 49 60  PROT 7.8 6.4* 6.1* 7.1  ALBUMIN 4.3 3.4* 2.8* 3.3*    Assessment:  Diverticular abscess drain evaluation Drain injection showing NO communication to bowel  CT reveal abscess collection is resolved Removal of drain per Dr Earleen Newport Pt to see Dr Joyice Faster Monday Jan 31  Signed: Lavonia Drafts, PA-C 08/29/2020, 1:07 PM   Please refer to Dr. Earleen Newport attestation of this note for management and plan.      Patient ID: [name], male   DOB: November 13, 1955, 65 y.o.   MRN: [id number]

## 2020-08-31 ENCOUNTER — Other Ambulatory Visit: Payer: PRIVATE HEALTH INSURANCE

## 2020-09-07 NOTE — Telephone Encounter (Signed)
Ok we just need to know what is covered by his plan   Garner Nash, DO [facility] 09/07/2020 6:10 PM

## 2020-09-07 NOTE — Telephone Encounter (Signed)
Fax received stating that the PA for Trelegy has been denied.  Why was my request denied? This request was denied because you did not meet the following clinical requirements: The requested medication and/or diagnosis are not a covered benefit and excluded from coverage in accordance with the terms and conditions of your plan benefit. Therefore, the request has been administratively denied. The requested medication and/or diagnosis are not a covered benefit and are excluded from coverage in accordance with the terms and conditions of your plan benefit. Therefore, this request has been administratively denied. The reason(s) [employer] did not approve this medication can be found above. This denial is based on our Trelegy [employer] drug coverage policy, in addition to any supplementary information you or your prescriber may have submitted.  Dr. Valeta Harms, please advise.

## 2020-09-11 NOTE — Telephone Encounter (Signed)
Called the pt and he stated that he only had to pay $50 for the trelegy.  They will only cover this for 10 months.  Pt will use the discount card for now and we can plan on trying another PA when he gets closer to the end of the 10 months.

## 2020-09-11 NOTE — Telephone Encounter (Signed)
Attempted to call pt but unable to reach. Left message for him to return call. °

## 2020-09-11 NOTE — Telephone Encounter (Signed)
Pt returning missed call. Pt states trelegy works for him and doesn't wish to find an alternative. Pt states he has a discount card if the rx is not covered by [employer]. Please advise.

## 2020-09-11 NOTE — Telephone Encounter (Signed)
Attempted to call pt but unable to reach. Left pt a detailed message on machine stating to him that it is okay for him to stay on Trelegy since it works well for him and that he could use discount card that he has as Trelegy is not covered by insurance. Nothing further needed.

## 2020-09-17 ENCOUNTER — Encounter: Payer: Self-pay | Admitting: Internal Medicine

## 2020-09-24 ENCOUNTER — Other Ambulatory Visit (HOSPITAL_COMMUNITY): Payer: Self-pay

## 2020-09-24 DIAGNOSIS — I82431 Acute embolism and thrombosis of right popliteal vein: Secondary | ICD-10-CM

## 2020-09-25 ENCOUNTER — Other Ambulatory Visit: Payer: Self-pay

## 2020-09-25 ENCOUNTER — Inpatient Hospital Stay (HOSPITAL_COMMUNITY): Payer: 59

## 2020-09-25 ENCOUNTER — Ambulatory Visit (HOSPITAL_COMMUNITY)
Admission: RE | Admit: 2020-09-25 | Discharge: 2020-09-25 | Disposition: A | Discharge status: Home / Self Care | Payer: 59 | Source: Ambulatory Visit | Attending: Hematology | Admitting: Hematology

## 2020-09-25 DIAGNOSIS — I82431 Acute embolism and thrombosis of right popliteal vein: Secondary | ICD-10-CM | POA: Insufficient documentation

## 2020-09-25 LAB — D-DIMER, QUANTITATIVE: D-Dimer, Quant: 1.19 ug/mL-FEU — ABNORMAL HIGH (ref 0.00–0.50)

## 2020-09-25 LAB — URIC ACID: Uric Acid, Serum: 5.1 mg/dL (ref 3.7–8.6)

## 2020-10-02 ENCOUNTER — Other Ambulatory Visit: Payer: Self-pay

## 2020-10-02 ENCOUNTER — Inpatient Hospital Stay (HOSPITAL_COMMUNITY): Payer: 59 | Attending: Hematology | Admitting: Hematology

## 2020-10-02 ENCOUNTER — Encounter (HOSPITAL_COMMUNITY): Payer: Self-pay | Admitting: Hematology

## 2020-10-02 VITALS — BP 110/63 | HR 94 | Temp 96.8°F | Resp 18 | Wt 222.7 lb

## 2020-10-02 DIAGNOSIS — I82431 Acute embolism and thrombosis of right popliteal vein: Secondary | ICD-10-CM | POA: Diagnosis not present

## 2020-10-02 DIAGNOSIS — Z87891 Personal history of nicotine dependence: Secondary | ICD-10-CM | POA: Diagnosis not present

## 2020-10-02 DIAGNOSIS — Z7901 Long term (current) use of anticoagulants: Secondary | ICD-10-CM | POA: Insufficient documentation

## 2020-10-02 DIAGNOSIS — Z86718 Personal history of other venous thrombosis and embolism: Secondary | ICD-10-CM | POA: Insufficient documentation

## 2020-10-02 DIAGNOSIS — Z79899 Other long term (current) drug therapy: Secondary | ICD-10-CM | POA: Insufficient documentation

## 2020-10-02 MED ORDER — RIVAROXABAN (XARELTO) VTE STARTER PACK (15 & 20 MG): ORAL_TABLET | ORAL | 0 refills | Status: DC

## 2020-10-02 NOTE — Progress Notes (Signed)
Office Visit Note  Patient: [name]             Date of Birth: 06/04/1956           MRN: [id number]             PCP: [name], MD Referring: [name], MD Visit Date: 10/03/2020   Subjective:  New Patient (Initial Visit) (Patient complains of right hand and bilateral shoulder pain. Patient sees Dr. Alvan Dame regarding history of hip replacements. Patient has history of gout, patient taking Allopurinol daily and Colchicine for flares- patient is coming off a gout flare (right foot) right now. )   History of Present Illness: [name] is a 65 y.o. male with a history of alcoholic pancreatitis, diverticulosis with perforation, esophageal stenosis, AVN of the femur, prostate cancer, and multiple DVTs here for evaluation of gout with recent right foot redness, swelling, and pain. He is currently prescribed allopurinol 100 mg daily and colchicine 0.6 mg. His new DVT appears to be provoked by the recent surgery for a perforation complicating his diverticulitis. He was recently treated with another DVT this at the right popliteal vein and now started on xarelto for this, previously waiting for removal of JP drain postoperatively. He did not have any history of gout till December of last year after which he describes 3 attacks of joint pain and swelling in the left great toe, right great toe, and this most recent pain in the right medial foot. This pain and swelling has already decreased more than halfway he is not taking any colchicine at this time. He has not yet started the prescribed xarelto for his right popliteal DVT.   Labs reviewed 09/2020 Uric acid 5.1  08/2020 BMP glucose 160 else wnl CBC Hgb 12 else wnl   Activities of Daily Living:  Patient reports morning stiffness for 1-30 minutes.   Patient Reports nocturnal pain.  Difficulty dressing/grooming: Denies Difficulty climbing stairs: Denies Difficulty getting out of chair: Denies Difficulty using hands for  taps, buttons, cutlery, and/or writing: Reports  Review of Systems  Constitutional: Positive for fatigue.  HENT: Negative for mouth sores, mouth dryness and nose dryness.   Eyes: Negative for pain, itching, visual disturbance and dryness.  Respiratory: Negative for cough, hemoptysis, shortness of breath and difficulty breathing.   Cardiovascular: Positive for swelling in legs/feet. Negative for chest pain and palpitations.  Gastrointestinal: Negative for abdominal pain, blood in stool, constipation and diarrhea.  Endocrine: Negative for increased urination.  Genitourinary: Negative for painful urination.  Musculoskeletal: Positive for arthralgias, joint pain, joint swelling and morning stiffness. Negative for myalgias, muscle weakness, muscle tenderness and myalgias.  Skin: Positive for color change. Negative for rash and redness.  Allergic/Immunologic: Negative for susceptible to infections.  Neurological: Positive for headaches. Negative for dizziness, numbness, memory loss and weakness.  Hematological: Negative for swollen glands.  Psychiatric/Behavioral: Negative for confusion and sleep disturbance.    PMFS History:  Patient Active Problem List   Diagnosis Date Noted  . Idiopathic chronic gout, unspecified site, without tophus (tophi) 10/03/2020  . Bilateral shoulder pain 10/03/2020  . Diverticulitis of colon 08/10/2020  . Alcohol abuse 10/12/2019  . Acute alcoholic pancreatitis 65/68/1275  . History of hip replacement 02/07/2019  . Avascular necrosis of femur ([place]) 01/05/2019  . Lumbar radiculopathy 01/05/2019  . History of colonic polyps 08/19/2018  . Esophageal stenosis 08/19/2018  . DVT (deep venous thrombosis) ([place]) 05/11/2018  . Solid nodule of lung greater than 8 mm  in diameter 05/11/2018  . Abnormal CT scan, colon 04/13/2018  . Obstructive sleep apnea treated with continuous positive airway pressure (CPAP) 09/29/2017  . GERD (gastroesophageal reflux disease) 04/07/2017   . Dysphagia 01/29/2017  . Malignant neoplasm of prostate ([place]) 01/19/2017  . Pancreatitis, alcoholic 47/42/5956  . Alcoholism ([place]) 11/11/2011  . Tobacco abuse 11/11/2011  . Thrombocytopenia ([place]) 11/11/2011  . HYPERSOMNIA, ASSOCIATED WITH SLEEP APNEA 09/27/2009  . COPD (chronic obstructive pulmonary disease) ([place]) 09/07/2009  . Pneumothorax 09/07/2009  . BURSITIS 09/07/2009  . FATIGUE / MALAISE 09/07/2009  . SHORTNESS OF BREATH 09/07/2009  . PNEUMOTHORAX 09/07/2009    Past Medical History:  Diagnosis Date  . Bilateral shoulder bursitis   . Chronic back pain   . COPD (chronic obstructive pulmonary disease) with emphysema ([place])   . DDD (degenerative disc disease), lumbar    hx epideral injection  L5--S1   . Diverticulitis   . DVT (deep venous thrombosis) ([place]) 11/19/2018  . Dysphasia    intermittant w/ food   . Dyspnea   . Dyspnea on exertion   . GERD (gastroesophageal reflux disease)   . Gout    L great toe  . Heterozygous factor V Leiden mutation ([place])   . Hiatal hernia   . Hip problem 2020   left, seeing orthopedics  . History of acute pancreatitis    alcoholic pancreatitis [id number] and 01-30-2012  . History of colon polyps    10-17-2005  hyperplastic polyp's  . History of esophageal stricture    s/p  dilation 02-02-2017  . History of peptic ulcer 1990s  . History of pneumococcal pneumonia 2006   bilateral pneumonia w/ left lower lobe abscess treated w/ chest tube with suction for drainage  . History of pneumothorax    1984--  left spontaneous pneumothorax ,  treated w/ chest tube  . Hypertension   . Malignant neoplasm of prostate [facility]) urologist-  dr dahlstedt/  oncologist -- dr Tammi Klippel   dx 12-02-2016-- Stage T2b, Gleason 3+4,  PSA 5.99,  vol 25cc  . Numbness of left foot    outer left ankle numb-- per pt has appt. w/ neurologist  . OSA (obstructive sleep apnea)    per pt has not used cpap over year ago from 04-16-2017--- per study 01-17-2010  severe osa  .  Solid nodule of lung greater than 8 mm in diameter 05/11/2018   03/2018: RUL Nodule, 59mm  . Wears glasses     Family History  Problem Relation Age of Onset  . Diabetes Mother   . Heart disease Father   . Heart disease Brother   . Cancer Neg Hx   . Colon cancer Neg Hx   . Colon polyps Neg Hx   . Neuropathy Neg Hx    Past Surgical History:  Procedure Laterality Date  . BIOPSY  02/02/2017   Procedure: BIOPSY;  Surgeon: Daneil Dolin, MD;  [facility];  Service: Endoscopy;;  duodenal bx's, esophgeal bx's  . BIOPSY  07/30/2017   Procedure: BIOPSY;  Surgeon: Daneil Dolin, MD;  [facility]: [facility];  Service: Endoscopy;;  esophagus  . CARDIOVASCULAR STRESS TEST  09/12/2009   normal nuclear perfusion study w/ no ischemia /  normal LV function and wall motion , ef 57%  . COLONOSCOPY  2007   Dr. Gala Romney: hyperplastic polyps, internal hemorrhoids   . COLONOSCOPY WITH PROPOFOL N/A 05/03/2018   Dr. Gala Romney: diverticulosis in entire colon, multiple polyps in sigmoid, at splenic flexure, and ascending colon,  not all polyps removed. Tubular adenomas and inflammatory polyps. Needs 3 year surveilance  . EPIDURAL BLOCK INJECTION  2020  . ESOPHAGOGASTRODUODENOSCOPY (EGD) WITH PROPOFOL N/A 02/02/2017   Dr. Gala Romney: esophageal stenosis s/p dilation and biopsy, medium sized hiatal hernia, normal duodenum  . ESOPHAGOGASTRODUODENOSCOPY (EGD) WITH PROPOFOL N/A 07/30/2017   Dr. Gala Romney: esophageal stenosis s/p dilation and biopsy, medium-sized hiatal hernia, normal duodenum  . HEMORRHOID SURGERY  01-21-2008    [facility]  . IR RADIOLOGIST EVAL & MGMT  08/29/2020  . MALONEY DILATION N/A 02/02/2017   Procedure: Venia Minks DILATION;  Surgeon: Daneil Dolin, MD;  [facility];  Service: Endoscopy;  Laterality: N/A;  . Venia Minks DILATION N/A 07/30/2017   Procedure: Venia Minks DILATION;  Surgeon: Daneil Dolin, MD;  [facility];  Service: Endoscopy;  Laterality: N/A;  . POLYPECTOMY   05/03/2018   Procedure: POLYPECTOMY;  Surgeon: Daneil Dolin, MD;  [facility];  Service: Endoscopy;;  ascending colon polyp, sigmoid colon polyps (multiple)  . RADIOACTIVE SEED IMPLANT N/A 04/23/2017   Procedure: RADIOACTIVE SEED IMPLANT/BRACHYTHERAPY IMPLANT;  Surgeon: Franchot Gallo, MD;  Location: [facility];  Service: Urology;  Laterality: N/A;  . SHOULDER ARTHROSCOPY  08/05/2003  . SPACE OAR INSTILLATION N/A 04/23/2017   Procedure: SPACE OAR INSTILLATION;  Surgeon: Franchot Gallo, MD;  Location: [facility];  Service: Urology;  Laterality: N/A;  . TRANSTHORACIC ECHOCARDIOGRAM  03/28/2009   mild LVH, ef 55-60%/ mild aorta calcification  . VASECTOMY  08/05/1983  . VIDEO ASSISTED THORACOSCOPY (VATS)/DECORTICATION Left    Social History   Social History Narrative   Lives at home with his wife   Right handed   Immunization History  Administered Date(s) Administered  . Influenza Whole 05/30/2019  . PFIZER(Purple Top)SARS-COV-2 Vaccination 10/27/2019, 11/19/2019, 06/01/2020  . Tdap 08/22/2016     Objective: Vital Signs: BP 120/73 (BP Location: Right Arm, Patient Position: Sitting, Cuff Size: Normal)   Pulse (!) 130   Ht 6' (1.829 m)   Wt 224 lb 3.2 oz (101.7 kg)   BMI 30.41 kg/m    Physical Exam Constitutional:      Appearance: He is obese.  HENT:     Right Ear: External ear normal.     Left Ear: External ear normal.     Mouth/Throat:     Mouth: Mucous membranes are moist.     Pharynx: Oropharynx is clear.  Eyes:     Conjunctiva/sclera: Conjunctivae normal.  Cardiovascular:     Rate and Rhythm: Normal rate and regular rhythm.  Pulmonary:     Comments: Mildly increased work of breathing at rest, faint expiratory wheezes, no pursed lips Skin:    General: Skin is warm and dry.     Comments: Hyperpigmentation overlying anterior and medial left shin, hyperpigmentation right shin and overlying the ankle, swelling extending  up to the knee and including posterior calf  Neurological:     General: No focal deficit present.     Mental Status: He is alert.  Psychiatric:        Mood and Affect: Mood normal.      Musculoskeletal Exam:  Neck full ROM no tenderness Shoulders full ROM with stiffness and pain abducting overhead Elbows extension ROM limited by about 20 degrees, no swelling Wrists full ROM no tenderness or swelling Fingers full ROM no tenderness or swelling few heberdon's nodes without tenderness Paraspinal tenderness at cervical spine and out towards shoulders, mild lumbar paraspinal tenderness on left  side Hip normal internal and external rotation without pain, left hip lateral pain with Pace maneuver Knees full ROM no tenderness or swelling, tightness in back of the knee with full extension Ankles full ROM no tenderness, overlying swelling of the right ankle MTPs full ROM no tenderness or swelling   Investigation: No additional findings.  Imaging: US Venous Img Lower Unilateral Right  Result Date: 09/25/2020 CLINICAL DATA:  65 year old male with history of prior DVT. Record popliteal DVT January 2022 EXAM: RIGHT LOWER EXTREMITY VENOUS DOPPLER ULTRASOUND TECHNIQUE: Gray-scale sonography with graded compression, as well as color Doppler and duplex ultrasound were performed to evaluate the lower extremity deep venous systems from the level of the common femoral vein and including the common femoral, femoral, profunda femoral, popliteal and calf veins including the posterior tibial, peroneal and gastrocnemius veins when visible. The superficial great saphenous vein was also interrogated. Spectral Doppler was utilized to evaluate flow at rest and with distal augmentation maneuvers in the common femoral, femoral and popliteal veins. COMPARISON:  No comparison available FINDINGS: Contralateral Common Femoral Vein: Respiratory phasicity is normal and symmetric with the symptomatic side. No evidence of  thrombus. Normal compressibility. Common Femoral Vein: No evidence of thrombus. Normal compressibility, respiratory phasicity and response to augmentation. Saphenofemoral Junction: No evidence of thrombus. Normal compressibility and flow on color Doppler imaging. Profunda Femoral Vein: No evidence of thrombus. Normal compressibility and flow on color Doppler imaging. Femoral Vein: No evidence of thrombus. Normal compressibility, respiratory phasicity and response to augmentation. Popliteal Vein: Noncompressible popliteal vein with heterogeneously hypoechoic material. No flow. There is no extension into the femoral vein. No visualized extension into the posterior tibial veins or peroneal veins. Calf Veins: No evidence of thrombus. Normal compressibility and flow on color Doppler imaging. Superficial Great Saphenous Vein: No evidence of thrombus. Normal compressibility and flow on color Doppler imaging. Other Findings:  None. IMPRESSION: Venous duplex right lower extremity confirms short segment popliteal vein DVT, compatible with the given reported history. No extension to the femoral vein. Electronically Signed   By: Corrie Mckusick D.O.   On: 09/25/2020 11:15    Recent Labs: Lab Results  Component Value Date   WBC 7.9 08/27/2020   HGB 14.4 08/27/2020   PLT 328 08/27/2020   NA 138 08/21/2020   K 3.9 08/21/2020   CL 103 08/21/2020   CO2 23 08/21/2020   GLUCOSE 98 08/21/2020   BUN 11 08/21/2020   CREATININE 0.80 08/21/2020   BILITOT 1.3 (H) 08/10/2020   ALKPHOS 60 08/10/2020   AST 27 08/10/2020   ALT 25 08/10/2020   PROT 7.1 08/10/2020   ALBUMIN 3.3 (L) 08/10/2020   CALCIUM 9.2 08/21/2020   GFRAA >60 10/13/2019    Speciality Comments: No specialty comments available.  Procedures:  No procedures performed Allergies: Patient has no known allergies.   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 No crystal proven disease. Uric acid was normal although can be falsely low during acute flare. Will check uric acid now outside of flare. If high would increase allopurinol and if low would recommend continue current treatments and follow up for repeat flares. Continuing allopurinol 100mg  daily.  Deep vein thrombosis (DVT) of popliteal vein of right lower extremity, unspecified chronicity ([name])  Currently right calf and ankle remain swollen  and warm to touch he has not started xarelto for his popliteal DVT. I recommended he start this as previously prescribed. The right lower extremity pain could also be coming at least in part from this.  Chronic pain of both shoulders - Plan: Rheumatoid factor  Bilateral shoulder pain without obvious inflammatory changes. This is a less common site for gouty arthropathy. Will check RF as well for polyarthritis.  Orders: Orders Placed This Encounter  Procedures  . Uric acid  . Rheumatoid factor   No orders of the defined types were placed in this encounter.    Follow-Up Instructions: No follow-ups on file.   Collier Salina, MD  Note - This record has been created using [employer].  Chart creation errors have been sought, but may not always  have been located. Such creation errors do not reflect on  the standard of medical care.

## 2020-10-02 NOTE — Progress Notes (Addendum)
[place], [place] [address]   CLINIC:  Medical Oncology/Hematology  PCP:  [name], MD [place]. / [place] [address]  [phone]  REASON FOR VISIT:  Follow-up for DVT of right lower leg  PRIOR THERAPY: Xarelto from 05/2018 to 09/2018  CURRENT THERAPY: Observation  INTERVAL HISTORY:  [name], a 65 y.o. male, returns for routine follow-up for his recurrent DVT of right lower leg. [name] was last seen on 08/28/2020, after having been diagnosed with recurrent DVT on 08/17/20.  He was not started on anticoagulation at that time, as he had a current JP drain in place for drainage of diverticular abscess.  Today he reports feeling well.  His JP drain and PICC line have been removed.  He notes that he experienced an acute episode of gouty arthritis of his right foot at about the same time he was diagnosed with right popliteal DVT. His right lower extremity edema is nearly resolved, and he denies any right lower extremity pain, warmth, or tenderness.  No chest pain, cough, SOB/DOE.  No current or previous history of bleeding events - has done well on Xarelto in the past.   REVIEW OF SYSTEMS:  Review of Systems  Constitutional: Negative for chills, fatigue, fever and unexpected weight change.  HENT:   Negative for nosebleeds.   Respiratory: Negative for chest tightness, cough, hemoptysis and shortness of breath.   Cardiovascular: Positive for leg swelling (Right lower extremity, improved). Negative for chest pain.  Gastrointestinal: Negative for abdominal distention, abdominal pain, blood in stool, nausea and vomiting.  Genitourinary: Negative for hematuria.   Musculoskeletal: Positive for arthralgias (Gouty arthritis of right foot).  Skin: Negative.   Neurological: Negative.   Hematological: Negative for adenopathy. Does not bruise/bleed easily.  Psychiatric/Behavioral: Negative for confusion. The patient is not  nervous/anxious.     PAST MEDICAL/SURGICAL HISTORY:  Past Medical History:  Diagnosis Date   Bilateral shoulder bursitis    Chronic back pain    COPD (chronic obstructive pulmonary disease) with emphysema ([name])    DDD (degenerative disc disease), lumbar    hx epideral injection  L5--S1    Diverticulitis    DVT (deep venous thrombosis) ([place]) 11/19/2018   Dysphasia    intermittant w/ food    Dyspnea    Dyspnea on exertion    GERD (gastroesophageal reflux disease)    Gout    L great toe   Heterozygous factor V Leiden mutation ([place])    Hiatal hernia    Hip problem 2020   left, seeing orthopedics   History of acute pancreatitis    alcoholic pancreatitis [id number] and 01-30-2012   History of colon polyps    10-17-2005  hyperplastic polyp's   History of esophageal stricture    s/p  dilation 02-02-2017   History of peptic ulcer 1990s   History of pneumococcal pneumonia 2006   bilateral pneumonia w/ left lower lobe abscess treated w/ chest tube with suction for drainage   History of pneumothorax    1984--  left spontaneous pneumothorax ,  treated w/ chest tube   Hypertension    Malignant neoplasm of prostate [facility]) urologist-  dr dahlstedt/  oncologist -- dr Tammi Klippel   dx 12-02-2016-- Stage T2b, Gleason 3+4,  PSA 5.99,  vol 25cc   Numbness of left foot    outer left ankle numb-- per pt has appt. w/ neurologist   OSA (obstructive sleep apnea)    per pt  has not used cpap over year ago from 04-16-2017--- per study 01-17-2010  severe osa   Solid nodule of lung greater than 8 mm in diameter 05/11/2018   03/2018: RUL Nodule, 79mm   Wears glasses    Past Surgical History:  Procedure Laterality Date   BIOPSY  02/02/2017   Procedure: BIOPSY;  Surgeon: Daneil Dolin, MD;  [facility];  Service: Endoscopy;;  duodenal bx's, esophgeal bx's   BIOPSY  07/30/2017   Procedure: BIOPSY;  Surgeon: Daneil Dolin, MD;  [facility];  Service:  Endoscopy;;  esophagus   CARDIOVASCULAR STRESS TEST  09/12/2009   normal nuclear perfusion study w/ no ischemia /  normal LV function and wall motion , ef 57%   COLONOSCOPY  2007   Dr. Gala Romney: hyperplastic polyps, internal hemorrhoids    COLONOSCOPY WITH PROPOFOL N/A 05/03/2018   Dr. Gala Romney: diverticulosis in entire colon, multiple polyps in sigmoid, at splenic flexure, and ascending colon, not all polyps removed. Tubular adenomas and inflammatory polyps. Needs 3 year surveilance   EPIDURAL BLOCK INJECTION  2020   ESOPHAGOGASTRODUODENOSCOPY (EGD) WITH PROPOFOL N/A 02/02/2017   Dr. Gala Romney: esophageal stenosis s/p dilation and biopsy, medium sized hiatal hernia, normal duodenum   ESOPHAGOGASTRODUODENOSCOPY (EGD) WITH PROPOFOL N/A 07/30/2017   Dr. Gala Romney: esophageal stenosis s/p dilation and biopsy, medium-sized hiatal hernia, normal duodenum   HEMORRHOID SURGERY  01-21-2008    [facility]   IR RADIOLOGIST EVAL & MGMT  08/29/2020   MALONEY DILATION N/A 02/02/2017   Procedure: Venia Minks DILATION;  Surgeon: Daneil Dolin, MD;  [facility];  Service: Endoscopy;  Laterality: N/A;   MALONEY DILATION N/A 07/30/2017   Procedure: Venia Minks DILATION;  Surgeon: Daneil Dolin, MD;  [facility];  Service: Endoscopy;  Laterality: N/A;   POLYPECTOMY  05/03/2018   Procedure: POLYPECTOMY;  Surgeon: Daneil Dolin, MD;  [facility];  Service: Endoscopy;;  ascending colon polyp, sigmoid colon polyps (multiple)   RADIOACTIVE SEED IMPLANT N/A 04/23/2017   Procedure: RADIOACTIVE SEED IMPLANT/BRACHYTHERAPY IMPLANT;  Surgeon: Franchot Gallo, MD;  Location[facility];  Service: Urology;  Laterality: N/A;   SHOULDER ARTHROSCOPY  08/05/2003   SPACE OAR INSTILLATION N/A 04/23/2017   Procedure: SPACE OAR INSTILLATION;  Surgeon: Franchot Gallo, MD;  Location: [facility];  Service: Urology;  Laterality: N/A;   TRANSTHORACIC ECHOCARDIOGRAM   03/28/2009   mild LVH, ef 55-60%/ mild aorta calcification   VASECTOMY  08/05/1983   VIDEO ASSISTED THORACOSCOPY (VATS)/DECORTICATION Left     SOCIAL HISTORY:  Social History   Socioeconomic History   Marital status: Married    Spouse name: Not on file   Number of children: 2   Years of education: Not on file   Highest education level: Bachelor's degree (e.g., BA, AB, BS)  Occupational History   Not on file  Tobacco Use   Smoking status: Former Smoker    Packs/day: 1.00    Years: 30.00    Pack years: 30.00    Types: Cigarettes, E-cigarettes    Quit date: 10/03/2015    Years since quitting: 5.0   Smokeless tobacco: Never Used   Tobacco comment: stoped vaping 2018  Vaping Use   Vaping Use: Former  Substance and Sexual Activity   Alcohol use: Yes    Alcohol/week: 21.0 standard drinks    Types: 21 Shots of liquor per week    Comment: 2-3 drinks daily (Wine); 01/04/19 pt states he  hasn't had anything to drink in 2-3 months   Drug use: No   Sexual activity: Yes    Birth control/protection: None  Other Topics Concern   Not on file  Social History Narrative   Lives at home with his wife   Right handed   Social Determinants of Health   Financial Resource Strain: Not on file  Food Insecurity: Not on file  Transportation Needs: Not on file  Physical Activity: Not on file  Stress: Not on file  Social Connections: Not on file  Intimate Partner Violence: Not on file    FAMILY HISTORY:  Family History  Problem Relation Age of Onset   Diabetes Mother    Heart disease Father    Heart disease Brother    Cancer Neg Hx    Colon cancer Neg Hx    Colon polyps Neg Hx    Neuropathy Neg Hx     CURRENT MEDICATIONS:  Current Outpatient Medications  Medication Sig Dispense Refill   acetaminophen (TYLENOL) 500 MG tablet Take 2 tablets (1,000 mg total) by mouth every 6 (six) hours as needed for mild pain. 30 tablet 0   diclofenac Sodium (VOLTAREN) 1 % GEL  Apply 2 g topically 4 (four) times daily as needed (pain). 50 g 0   docusate sodium (COLACE) 100 MG capsule Take 1 capsule (100 mg total) by mouth 2 (two) times daily as needed for mild constipation. 10 capsule 0   doxycycline (VIBRAMYCIN) 50 MG capsule Take 50 mg by mouth daily.     Fluticasone-Umeclidin-Vilant (TRELEGY ELLIPTA) 100-62.5-25 MCG/INH AEPB Inhale 1 puff into the lungs daily. 60 each 11   Ibuprofen 200 MG CAPS Take 1 capsule by mouth daily as needed (For pain).     lidocaine (LIDODERM) 5 % Place 1 patch onto the skin daily as needed (For back pain). Remove & Discard patch within 12 hours or as directed by MD     Multiple Vitamin (MULTIVITAMIN WITH MINERALS) TABS tablet Take 1 tablet by mouth daily.     oxyCODONE (OXY IR/ROXICODONE) 5 MG immediate release tablet Take 1-2 tablets (5-10 mg total) by mouth every 6 (six) hours as needed for severe pain. 15 tablet 0   saccharomyces boulardii (FLORASTOR) 250 MG capsule Recommend taking a probiotic, you can find this over the counter     sodium chloride flush (NS) 0.9 % SOLN Flush abdominal drain with 5cc normal saline once daily. 30 Syringe 1   No current facility-administered medications for this visit.    ALLERGIES:  No Known Allergies  PHYSICAL EXAM:  Performance status (ECOG): 0 - Asymptomatic  Vitals:   10/02/20 1349  BP: 110/63  Pulse: 94  Resp: 18  Temp: (!) 96.8 F (36 C)  SpO2: 98%   Wt Readings from Last 3 Encounters:  10/02/20 222 lb 11.2 oz (101 kg)  08/28/20 223 lb 8 oz (101.4 kg)  08/10/20 229 lb 15 oz (104.3 kg)   Physical Exam Constitutional:      Appearance: Normal appearance. He is obese.  HENT:     Head: Normocephalic and atraumatic.     Nose: Nose normal.     Mouth/Throat:     Mouth: Mucous membranes are moist.  Eyes:     Extraocular Movements: Extraocular movements intact.     Pupils: Pupils are equal, round, and reactive to light.  Cardiovascular:     Rate and Rhythm: Normal rate and  regular rhythm.     Pulses: Normal pulses.  Heart sounds: Normal heart sounds.     Comments: 2+ pedal pulses of bilateral lower extremities Pulmonary:     Effort: Pulmonary effort is normal.     Breath sounds: Normal breath sounds. No wheezing, rhonchi or rales.  Abdominal:     General: There is no distension.     Palpations: Abdomen is soft.     Tenderness: There is no abdominal tenderness.  Musculoskeletal:        General: Normal range of motion.     Cervical back: Normal range of motion and neck supple.     Right lower leg: Edema (1 + pitting edema of right ankle) present.     Left lower leg: No edema.     Comments: Negative Homans' sign  Lymphadenopathy:     Cervical: No cervical adenopathy.  Skin:    General: Skin is warm and dry.     Comments: Stasis dermatitis and hyperpigmentation of bilateral shins  Neurological:     General: No focal deficit present.     Mental Status: He is alert and oriented to person, place, and time.  Psychiatric:        Mood and Affect: Mood normal.        Behavior: Behavior normal.     LABORATORY DATA:  I have reviewed the labs as listed.  CBC Latest Ref Rng & Units 08/27/2020 08/21/2020 08/20/2020  WBC 4.0 - 10.5 K/uL 7.9 7.4 7.9  Hemoglobin 13.0 - 17.0 g/dL 14.4 12.7(L) 13.0  Hematocrit 39.0 - 52.0 % 45.6 38.0(L) 38.7(L)  Platelets 150 - 400 K/uL 328 354 343   CMP Latest Ref Rng & Units 08/21/2020 08/20/2020 08/18/2020  Glucose 70 - 99 mg/dL 98 160(H) 110(H)  BUN 8 - 23 mg/dL 11 12 8   Creatinine 0.61 - 1.24 mg/dL 0.80 0.87 0.84  Sodium 135 - 145 mmol/L 138 137 142  Potassium 3.5 - 5.1 mmol/L 3.9 3.5 3.8  Chloride 98 - 111 mmol/L 103 101 107  CO2 22 - 32 mmol/L 23 26 27   Calcium 8.9 - 10.3 mg/dL 9.2 9.2 8.8(L)  Total Protein 6.5 - 8.1 g/dL - - -  Total Bilirubin 0.3 - 1.2 mg/dL - - -  Alkaline Phos 38 - 126 U/L - - -  AST 15 - 41 U/L - - -  ALT 0 - 44 U/L - - -      Component Value Date/Time   RBC 4.70 08/27/2020 0920   MCV 97.0  08/27/2020 0920   MCH 30.6 08/27/2020 0920   MCHC 31.6 08/27/2020 0920   RDW 13.0 08/27/2020 0920   LYMPHSABS 2.7 08/27/2020 0920   MONOABS 0.7 08/27/2020 0920   EOSABS 0.4 08/27/2020 0920   BASOSABS 0.1 08/27/2020 0920    DIAGNOSTIC IMAGING:  I have independently reviewed the scans and discussed with the patient. US Venous Img Lower Unilateral Right  Result Date: 09/25/2020 CLINICAL DATA:  66 year old male with history of prior DVT. Record popliteal DVT January 2022 EXAM: RIGHT LOWER EXTREMITY VENOUS DOPPLER ULTRASOUND TECHNIQUE: Gray-scale sonography with graded compression, as well as color Doppler and duplex ultrasound were performed to evaluate the lower extremity deep venous systems from the level of the common femoral vein and including the common femoral, femoral, profunda femoral, popliteal and calf veins including the posterior tibial, peroneal and gastrocnemius veins when visible. The superficial great saphenous vein was also interrogated. Spectral Doppler was utilized to evaluate flow at rest and with distal augmentation maneuvers in the common femoral, femoral and popliteal  veins. COMPARISON:  No comparison available FINDINGS: Contralateral Common Femoral Vein: Respiratory phasicity is normal and symmetric with the symptomatic side. No evidence of thrombus. Normal compressibility. Common Femoral Vein: No evidence of thrombus. Normal compressibility, respiratory phasicity and response to augmentation. Saphenofemoral Junction: No evidence of thrombus. Normal compressibility and flow on color Doppler imaging. Profunda Femoral Vein: No evidence of thrombus. Normal compressibility and flow on color Doppler imaging. Femoral Vein: No evidence of thrombus. Normal compressibility, respiratory phasicity and response to augmentation. Popliteal Vein: Noncompressible popliteal vein with heterogeneously hypoechoic material. No flow. There is no extension into the femoral vein. No visualized extension  into the posterior tibial veins or peroneal veins. Calf Veins: No evidence of thrombus. Normal compressibility and flow on color Doppler imaging. Superficial Great Saphenous Vein: No evidence of thrombus. Normal compressibility and flow on color Doppler imaging. Other Findings:  None. IMPRESSION: Venous duplex right lower extremity confirms short segment popliteal vein DVT, compatible with the given reported history. No extension to the femoral vein. Electronically Signed   By: Corrie Mckusick D.O.   On: 09/25/2020 11:15     ASSESSMENT:  1. Recurrent DVT of right leg, weakly provoked: -Initial DVT in the right leg diagnosed October 2019, provoked by long flight -Treated with Xarelto from October 2019 through February 2020 -Repeat venous duplex on 05/31/2019 was negative for DVT -Recurrent right popliteal DVT diagnosed 08/17/2020, occurred during inpatient hospitalization - lower extremity Doppler on 08/17/2020 showed age-indeterminate DVT involving the right popliteal vein. -Venous duplex of bilateral lower extremity obtained 09/25/2020 confirmed assistance of right popliteal DVT -Patient is heterozygous for factor V Leiden -Has had mildly elevated D-dimer since initial DVT was diagnosed, most recently 1.19 on 08/27/2020  2. Prostate cancer: -TRUS/biopsy on 12/02/2016. 4 mm left apical nodule, PSA 5.99. 6/12 cores positive for adenocarcinoma, all on the left side of the prostate. 3 cores revealed GS 3+3, 3 cores with GS 3+4. He underwent I-125 brachytherapy on 04/23/2017. -Latest PSA on 01/10/2020 was less than 0.1.   PLAN:  1. Recurrent DVT of right leg, weakly provoked: -Recurrent DVT diagnosed during hospital stay for diverticulitis from 08/10/2020 through 08/22/2020.  Anticoagulation initially deferred due to presence of JP drain which has since been removed  -Lower extremity Doppler on 09/25/2020 confirmed right popliteal DVT -He has minimal swelling of the right leg, but no cramping which he had  at presentation in 2019. -He continued to have elevated D-dimer throughout the last 3 years, was 1.19 on 09/25/2020 -We will start patient on Xarelto, and obtain repeat lower extremity Doppler D-dimer in 3 to 4 months, with subsequent return to clinic to discuss results and possibility of discontinuing anticoagulation  -Patient is heterozygous for factor V Leiden, but no current indication for lifelong anticoagulation, as clots thus far have been provoked -He is planning to visit a rheumatologist to discuss gout and arthritis - we will consider future work-up including cardiolipin antibodies, beta-2 glycoprotein, lupus anticoagulant, if not completed by rheumatologist - to be done after patient has stopped anticoagulation -He was told to go to the ER should he develop any leg pain or breathing difficulty or chest pains. -He is agreeable with the plan.  SUMMARY OF PLAN: -Prescription for Xarelto sent to pharmacy -Recheck D-dimer and lower extremity Doppler in 3 to 4 months -Return to clinic after imaging to discuss results and possibly stop anticoagulation    Derek Jack, MD [place] [phone]

## 2020-10-02 NOTE — Patient Instructions (Signed)
[facility] at [facility] Discharge Instructions  You were seen today by Dr. Katragadda. He went over your recent results. Dr. Katragadda will see you back in for labs and follow up.   Thank you for choosing [facility] at [facility] to provide your oncology and hematology care.  To afford each patient quality time with our provider, please arrive at least 15 minutes before your scheduled appointment time.   If you have a lab appointment with the Cancer Center please come in thru the Main Entrance and check in at the main information desk  You need to re-schedule your appointment should you arrive 10 or more minutes late.  We strive to give you quality time with our providers, and arriving late affects you and other patients whose appointments are after yours.  Also, if you no show three or more times for appointments you may be dismissed from the clinic at the providers discretion.     Again, thank you for choosing [facility].  Our hope is that these requests will decrease the amount of time that you wait before being seen by our physicians.       _____________________________________________________________  Should you have questions after your visit to  Cancer Center, please contact our office at [phone] between the hours of 8:00 a.m. and 4:30 p.m.  Voicemails left after 4:00 p.m. will not be returned until the following business day.  For prescription refill requests, have your pharmacy contact our office and allow 72 hours.    Cancer Center Support Programs:   > Cancer Support Group  2nd Tuesday of the month 1pm-2pm, Journey Room    

## 2020-10-03 ENCOUNTER — Encounter: Payer: Self-pay | Admitting: Internal Medicine

## 2020-10-03 ENCOUNTER — Ambulatory Visit (INDEPENDENT_AMBULATORY_CARE_PROVIDER_SITE_OTHER): Payer: 59 | Admitting: Internal Medicine

## 2020-10-03 VITALS — BP 120/73 | HR 130 | Ht 72.0 in | Wt 224.2 lb

## 2020-10-03 DIAGNOSIS — I82431 Acute embolism and thrombosis of right popliteal vein: Secondary | ICD-10-CM

## 2020-10-03 DIAGNOSIS — M1A00X0 Idiopathic chronic gout, unspecified site, without tophus (tophi): Secondary | ICD-10-CM | POA: Insufficient documentation

## 2020-10-03 DIAGNOSIS — G8929 Other chronic pain: Secondary | ICD-10-CM | POA: Diagnosis not present

## 2020-10-03 DIAGNOSIS — M1A0790 Idiopathic chronic gout, unspecified ankle and foot, without tophus (tophi): Secondary | ICD-10-CM | POA: Diagnosis not present

## 2020-10-03 DIAGNOSIS — M25511 Pain in right shoulder: Secondary | ICD-10-CM | POA: Diagnosis not present

## 2020-10-03 DIAGNOSIS — M25512 Pain in left shoulder: Secondary | ICD-10-CM | POA: Insufficient documentation

## 2020-10-03 NOTE — Patient Instructions (Signed)
I am checking uric acid level today since the previous test on 2/22 was normal but this can be falsely normal during an attack. If it is high we will need to increase the allopurinol. If it is low I do not recommend any new medicine currently but we can follow up whenever you see worsening symptoms again.   I recommend focusing on some stretches for the rear and side of the hip on a daily basis to improve flexibility there, particularly the left side. Strengthening exercises such as lunges and squats are also beneficial but do not need to be daily.   For shoulder pain I would also recommend some scapula stabilization stretches and exercise:  Scapular depression and retraction After you have practiced this exercise, try doing it without the arm support. Then, try doing it while standing instead of sitting. 1. Sit on a stable chair. Support your arms in front of you with pillows, armrests, or a tabletop. Keep your elbows near the sides of your body. 2. Gently move your shoulder blades down (scapular depression) and back toward your spine (retraction). Relax the muscles on the tops of your shoulders and in the back of your neck. 3. Hold for 4 seconds. 4. Slowly release the tension, and relax your muscles completely before you repeat the exercise. Repeat about 5-8 times

## 2020-10-04 LAB — RHEUMATOID FACTOR: Rheumatoid fact SerPl-aCnc: <14 IU/mL (ref <14)

## 2020-10-04 LAB — URIC ACID: Uric Acid, Serum: 5.6 mg/dL (ref 4.0–8.0)

## 2020-10-05 NOTE — Progress Notes (Signed)
The uric acid level 5.6 is already at his goal to prevent gout attacks. I recommend just staying at the current allopurinol 100mg  daily. He is negative for rheumatoid arthritis antibodies. I think a lot of the recent foot pain and swelling was related to his DVT and should improve with the treatment of this. We can see him back quickly if he has another flare up of foot pain and swelling to see if these are really coming from gout or not.

## 2020-10-12 ENCOUNTER — Ambulatory Visit (INDEPENDENT_AMBULATORY_CARE_PROVIDER_SITE_OTHER): Payer: 59 | Admitting: Internal Medicine

## 2020-10-12 ENCOUNTER — Encounter: Payer: Self-pay | Admitting: Internal Medicine

## 2020-10-12 ENCOUNTER — Telehealth: Payer: Self-pay

## 2020-10-12 ENCOUNTER — Other Ambulatory Visit: Payer: Self-pay

## 2020-10-12 VITALS — BP 156/88 | HR 82 | Temp 97.1°F | Ht 72.0 in | Wt 224.4 lb

## 2020-10-12 DIAGNOSIS — K5732 Diverticulitis of large intestine without perforation or abscess without bleeding: Secondary | ICD-10-CM | POA: Diagnosis not present

## 2020-10-12 MED ORDER — PEG 3350-KCL-NA BICARB-NACL 420 G PO SOLR: ORAL | 0 refills | Status: DC

## 2020-10-12 NOTE — Telephone Encounter (Addendum)
Called pt. He has been scheduled for 4/25. Aware will mail prep instructions with pre-op/covid test appt. Confirmed pharmacy. Confirmed mailing address. Aware to hold xarelto 48 hrs prior.

## 2020-10-12 NOTE — Addendum Note (Signed)
Addended by: Cheron Every on: 10/12/2020 10:59 AM   Modules accepted: Orders

## 2020-10-12 NOTE — Telephone Encounter (Signed)
Dr. Delton Coombes, pt was seen in office today with Dr. Gala Romney. Is it ok for pt to hold Xarelto 48 hours prior to colonoscopy?

## 2020-10-12 NOTE — Patient Instructions (Signed)
We will schedule a surveillance colonoscopy (history of colonic polyps/history of complicated diverticulitis).  Propofol ASA 3  Fiber fortified diet recommended  On top of your current diet, begin Benefiber 1 tablespoon daily for 3 weeks; then increase to 2 tablespoons thereafter.  We will plan to stop your Xarelto 2 days prior to your colonoscopy (will do this in consultation with Dr. Delton Coombes)  Minimize use of aspirin/NSAID medications like diclofenac while you are on anticoagulation medication.  Further recommendations to follow.

## 2020-10-12 NOTE — Progress Notes (Signed)
Primary Care Physician:  [name], MD Primary Gastroenterologist:  Dr. Gala Romney  Pre-Procedure History & Physical: HPI:  [name] is a 65 y.o. male here for follow-up/consideration of surveillance colonoscopy given history of multiple hyperplastic and adenomatous polyps removed 2019.  He had a notable bout of complicated diverticulitis with abscess requiring percutaneous drainage at [facility] the first of this year.  He was in the hospital 12 days.  He required IR percutaneous drainage and IV antibiotics.  He went home with a PICC line to complete his antibiotic regimen.  He has fully recovered as he states.  Pt endorses normal bowel function;  has not had any diarrhea, no blood per rectum or melena.  No abdominal pain whatsoever.  Recently diagnoses with a right lower extremity DVT for which he is now on Xarelto.  His report he has factor V Leiden deficiency.  He presents for a surveillance colonoscopy.  He has a history of GERD and esophageal stricture dilated previously.  He is not having any reflux symptoms.  No dysphagia.  He has adopted a plant-based diet and sounds like he is eating healthfully these days.  Past Medical History:  Diagnosis Date  . Bilateral shoulder bursitis   . Chronic back pain   . COPD (chronic obstructive pulmonary disease) with emphysema ([place])   . DDD (degenerative disc disease), lumbar    hx epideral injection  L5--S1   . Diverticulitis   . DVT (deep venous thrombosis) ([place]) 11/19/2018  . Dysphasia    intermittant w/ food   . Dyspnea   . Dyspnea on exertion   . GERD (gastroesophageal reflux disease)   . Gout    L great toe  . Heterozygous factor V Leiden mutation ([place])   . Hiatal hernia   . Hip problem 2020   left, seeing orthopedics  . History of acute pancreatitis    alcoholic pancreatitis [id number] and 01-30-2012  . History of colon polyps    10-17-2005  hyperplastic polyp's  . History of esophageal stricture    s/p  dilation  02-02-2017  . History of peptic ulcer 1990s  . History of pneumococcal pneumonia 2006   bilateral pneumonia w/ left lower lobe abscess treated w/ chest tube with suction for drainage  . History of pneumothorax    1984--  left spontaneous pneumothorax ,  treated w/ chest tube  . Hypertension   . Malignant neoplasm of prostate [facility]) urologist-  dr dahlstedt/  oncologist -- dr Tammi Klippel   dx 12-02-2016-- Stage T2b, Gleason 3+4,  PSA 5.99,  vol 25cc  . Numbness of left foot    outer left ankle numb-- per pt has appt. w/ neurologist  . OSA (obstructive sleep apnea)    per pt has not used cpap over year ago from 04-16-2017--- per study 01-17-2010  severe osa  . Solid nodule of lung greater than 8 mm in diameter 05/11/2018   03/2018: RUL Nodule, 29mm  . Wears glasses     Past Surgical History:  Procedure Laterality Date  . BIOPSY  02/02/2017   Procedure: BIOPSY;  Surgeon: Daneil Dolin, MD;  [facility];  Service: Endoscopy;;  duodenal bx's, esophgeal bx's  . BIOPSY  07/30/2017   Procedure: BIOPSY;  Surgeon: Daneil Dolin, MD;  [facility];  Service: Endoscopy;;  esophagus  . CARDIOVASCULAR STRESS TEST  09/12/2009   normal nuclear perfusion study w/ no ischemia /  normal LV function and wall motion , ef  57%  . COLONOSCOPY  2007   Dr. Gala Romney: hyperplastic polyps, internal hemorrhoids   . COLONOSCOPY WITH PROPOFOL N/A 05/03/2018   Dr. Gala Romney: diverticulosis in entire colon, multiple polyps in sigmoid, at splenic flexure, and ascending colon, not all polyps removed. Tubular adenomas and inflammatory polyps. Needs 3 year surveilance  . EPIDURAL BLOCK INJECTION  2020  . ESOPHAGOGASTRODUODENOSCOPY (EGD) WITH PROPOFOL N/A 02/02/2017   Dr. Gala Romney: esophageal stenosis s/p dilation and biopsy, medium sized hiatal hernia, normal duodenum  . ESOPHAGOGASTRODUODENOSCOPY (EGD) WITH PROPOFOL N/A 07/30/2017   Dr. Gala Romney: esophageal stenosis s/p dilation and biopsy, medium-sized hiatal  hernia, normal duodenum  . HEMORRHOID SURGERY  01-21-2008    [facility]  . IR RADIOLOGIST EVAL & MGMT  08/29/2020  . MALONEY DILATION N/A 02/02/2017   Procedure: Venia Minks DILATION;  Surgeon: Daneil Dolin, MD;  [facility];  Service: Endoscopy;  Laterality: N/A;  . Venia Minks DILATION N/A 07/30/2017   Procedure: Venia Minks DILATION;  Surgeon: Daneil Dolin, MD;  [facility];  Service: Endoscopy;  Laterality: N/A;  . POLYPECTOMY  05/03/2018   Procedure: POLYPECTOMY;  Surgeon: Daneil Dolin, MD;  [facility];  Service: Endoscopy;;  ascending colon polyp, sigmoid colon polyps (multiple)  . RADIOACTIVE SEED IMPLANT N/A 04/23/2017   Procedure: RADIOACTIVE SEED IMPLANT/BRACHYTHERAPY IMPLANT;  Surgeon: Franchot Gallo, MD;  Location: [facility];  Service: Urology;  Laterality: N/A;  . SHOULDER ARTHROSCOPY  08/05/2003  . SPACE OAR INSTILLATION N/A 04/23/2017   Procedure: SPACE OAR INSTILLATION;  Surgeon: Franchot Gallo, MD;  Location: [facility];  Service: Urology;  Laterality: N/A;  . TRANSTHORACIC ECHOCARDIOGRAM  03/28/2009   mild LVH, ef 55-60%/ mild aorta calcification  . VASECTOMY  08/05/1983  . VIDEO ASSISTED THORACOSCOPY (VATS)/DECORTICATION Left     Prior to Admission medications   Medication Sig Start Date End Date Taking? Authorizing Provider  acetaminophen (TYLENOL) 500 MG tablet Take 2 tablets (1,000 mg total) by mouth every 6 (six) hours as needed for mild pain. Patient taking differently: Take 1,000 mg by mouth as needed for mild pain. 08/21/20  Yes Meuth, Brooke A, PA-C  allopurinol (ZYLOPRIM) 100 MG tablet Take 100 mg by mouth daily. 09/17/20  Yes [provider]  colchicine 0.6 MG tablet Take 1 tablet by mouth as needed. Patient takes for flares, is currently taking it- 10/03/20 09/03/20  Yes [provider]  doxycycline (VIBRAMYCIN) 50 MG capsule Take 50 mg by mouth daily. 08/26/20  Yes [provider]  Fluticasone-Umeclidin-Vilant (TRELEGY ELLIPTA) 100-62.5-25 MCG/INH AEPB Inhale 1 puff into the lungs daily. 08/16/19  Yes Icard, Octavio Graves, DO  Multiple Vitamin (MULTIVITAMIN WITH MINERALS) TABS tablet Take 1 tablet by mouth daily. 08/22/20  Yes Meuth, Brooke A, PA-C  RIVAROXABAN (XARELTO) VTE STARTER PACK (15 & 20 MG) Follow package directions: Take one 15mg  tablet by mouth twice a day. On day 22, switch to one 20mg  tablet once a day. Take with food. 10/02/20  Yes Derek Jack, MD  albuterol (PROVENTIL) (2.5 MG/3ML) 0.083% nebulizer solution Take 3 mLs (2.5 mg total) by nebulization every 6 (six) hours as needed for wheezing or shortness of breath. Patient not taking: Reported on 03/14/2020 03/22/18 05/23/20  June Leap L, DO    Allergies as of 10/12/2020  . (No Known Allergies)    Family History  Problem Relation Age of Onset  . Diabetes Mother   . Heart disease Father   . Heart disease Brother   .  Cancer Neg Hx   . Colon cancer Neg Hx   . Colon polyps Neg Hx   . Neuropathy Neg Hx     Social History   Socioeconomic History  . Marital status: Married    Spouse name: Not on file  . Number of children: 2  . Years of education: Not on file  . Highest education level: Bachelor's degree (e.g., BA, AB, BS)  Occupational History  . Not on file  Tobacco Use  . Smoking status: Former Smoker    Packs/day: 1.00    Years: 30.00    Pack years: 30.00    Types: Cigarettes, E-cigarettes    Quit date: 10/03/2015    Years since quitting: 5.0  . Smokeless tobacco: Never Used  . Tobacco comment: stoped vaping 2018  Vaping Use  . Vaping Use: Former  Substance and Sexual Activity  . Alcohol use: Yes    Alcohol/week: 5.0 standard drinks    Types: 5 Glasses of wine per week  . Drug use: No  . Sexual activity: Yes    Birth control/protection: None  Other Topics Concern  . Not on file  Social History Narrative   Lives at home with his wife   Right handed   Social  Determinants of Health   Financial Resource Strain: Not on file  Food Insecurity: Not on file  Transportation Needs: Not on file  Physical Activity: Not on file  Stress: Not on file  Social Connections: Not on file  Intimate Partner Violence: Not on file    Review of Systems: See HPI, otherwise negative ROS  Physical Exam: BP (!) 156/88   Pulse 82   Temp (!) 97.1 F (36.2 C) (Temporal)   Ht 6' (1.829 m)   Wt 224 lb 6.4 oz (101.8 kg)   BMI 30.43 kg/m  General:   Alert,  Well-developed, well-nourished, pleasant and cooperative in NAD Lungs:  Clear throughout to auscultation.   No wheezes, crackles, or rhonchi. No acute distress. Heart:  Regular rate and rhythm; no murmurs, clicks, rubs,  or gallops. Abdomen: Non-distended, normal bowel sounds.  Soft and nontender without appreciable mass or hepatosplenomegaly.  Pulses:  Normal pulses noted. Extremities:  Without clubbing or edema.  Impression/Plan: 65 year old gentleman with a history multiple adenomatous and hyperplastic polyps removed at colonoscopy 3 years ago; due for surveillance examination.  In the interim, he suffered a bout of complicated diverticulitis (his first episode) requiring IR drainage.  He avoided surgery.  He is back to baseline.  He is now chronically anticoagulated as stated above.  I have offered him a surveillance colonoscopy per plan.  The risks, benefits, limitations, alternatives and imponderables have been reviewed with the patient. Questions have been answered. All parties are agreeable.  ASA 3/propofol.  Plan for him to come off of Xarelto 2 days prior to procedure.  I recommended he incorporate additional fiber in his diet utilizing Benefiber.  Further recommendations to follow.     Notice: This dictation was prepared with Dragon dictation along with smaller phrase technology. Any transcriptional errors that result from this process are unintentional and may not be corrected upon review.

## 2020-10-12 NOTE — Telephone Encounter (Signed)
Noted, routing message.

## 2020-10-12 NOTE — Telephone Encounter (Signed)
Communication noted.  

## 2020-10-15 ENCOUNTER — Encounter: Payer: Self-pay | Admitting: *Deleted

## 2020-10-26 ENCOUNTER — Encounter (HOSPITAL_COMMUNITY): Payer: Self-pay

## 2020-10-26 ENCOUNTER — Other Ambulatory Visit (HOSPITAL_COMMUNITY): Payer: Self-pay | Admitting: Physician Assistant

## 2020-10-26 ENCOUNTER — Telehealth: Payer: Self-pay | Admitting: Pulmonary Disease

## 2020-10-26 DIAGNOSIS — I82431 Acute embolism and thrombosis of right popliteal vein: Secondary | ICD-10-CM

## 2020-10-26 MED ORDER — RIVAROXABAN 20 MG PO TABS: 20.0000 mg | ORAL_TABLET | Freq: Every day | ORAL | 4 refills | Status: DC

## 2020-10-26 MED ORDER — TRELEGY ELLIPTA 100-62.5-25 MCG/INH IN AEPB: 1.0000 | INHALATION_SPRAY | Freq: Every day | RESPIRATORY_TRACT | 4 refills | Status: DC

## 2020-10-26 NOTE — Telephone Encounter (Signed)
Called and spoke with patient. Trelegy has been sent to [facility] in [place].   Nothing further needed at time of call.

## 2020-10-26 NOTE — Telephone Encounter (Signed)
Pt returning a phone call. Pt has an appt on 3/31. Pt can be reached at [phone].

## 2020-10-26 NOTE — Telephone Encounter (Signed)
Pt's last OV with our office was 06/15/19 with Dr. Valeta Harms. Pt needs an appt prior to [place] able to refill any more meds for him.  Attempted to call pt but unable to reach. Left message for him to return call.

## 2020-10-29 ENCOUNTER — Encounter (HOSPITAL_COMMUNITY): Payer: Self-pay

## 2020-10-31 DIAGNOSIS — G5603 Carpal tunnel syndrome, bilateral upper limbs: Secondary | ICD-10-CM | POA: Insufficient documentation

## 2020-10-31 DIAGNOSIS — R209 Unspecified disturbances of skin sensation: Secondary | ICD-10-CM | POA: Insufficient documentation

## 2020-10-31 DIAGNOSIS — M79641 Pain in right hand: Secondary | ICD-10-CM | POA: Insufficient documentation

## 2020-10-31 DIAGNOSIS — G5601 Carpal tunnel syndrome, right upper limb: Secondary | ICD-10-CM | POA: Insufficient documentation

## 2020-11-01 ENCOUNTER — Other Ambulatory Visit: Payer: Self-pay

## 2020-11-01 ENCOUNTER — Ambulatory Visit (INDEPENDENT_AMBULATORY_CARE_PROVIDER_SITE_OTHER): Payer: 59 | Admitting: Pulmonary Disease

## 2020-11-01 ENCOUNTER — Encounter: Payer: Self-pay | Admitting: Pulmonary Disease

## 2020-11-01 VITALS — BP 126/84 | HR 84 | Temp 97.3°F | Ht 72.0 in | Wt 222.0 lb

## 2020-11-01 DIAGNOSIS — J449 Chronic obstructive pulmonary disease, unspecified: Secondary | ICD-10-CM | POA: Diagnosis not present

## 2020-11-01 DIAGNOSIS — J302 Other seasonal allergic rhinitis: Secondary | ICD-10-CM

## 2020-11-01 DIAGNOSIS — Z86718 Personal history of other venous thrombosis and embolism: Secondary | ICD-10-CM

## 2020-11-01 DIAGNOSIS — Z87891 Personal history of nicotine dependence: Secondary | ICD-10-CM

## 2020-11-01 DIAGNOSIS — Z7901 Long term (current) use of anticoagulants: Secondary | ICD-10-CM

## 2020-11-01 DIAGNOSIS — M109 Gout, unspecified: Secondary | ICD-10-CM

## 2020-11-01 MED ORDER — TRELEGY ELLIPTA 100-62.5-25 MCG/INH IN AEPB: 1.0000 | INHALATION_SPRAY | Freq: Every day | RESPIRATORY_TRACT | 6 refills | Status: DC

## 2020-11-01 MED ORDER — PREDNISONE 10 MG PO TABS: 20.0000 mg | ORAL_TABLET | Freq: Every day | ORAL | 0 refills | Status: DC

## 2020-11-01 MED ORDER — TRELEGY ELLIPTA 100-62.5-25 MCG/INH IN AEPB: INHALATION_SPRAY | RESPIRATORY_TRACT | 0 refills | Status: DC

## 2020-11-01 NOTE — Patient Instructions (Signed)
Thank you for visiting Dr. Valeta Harms at [facility]. Today we recommend the following:  Orders Placed This Encounter  Procedures  . Ambulatory Referral for Lung Cancer Scre   Meds ordered this encounter  Medications  . predniSONE (DELTASONE) 10 MG tablet    Sig: Take 2 tablets (20 mg total) by mouth daily with breakfast.    Dispense:  20 tablet    Refill:  0  . Fluticasone-Umeclidin-Vilant (TRELEGY ELLIPTA) 100-62.5-25 MCG/INH AEPB    Sig: Inhale 1 puff into the lungs daily.    Dispense:  60 each    Refill:  6  . Fluticasone-Umeclidin-Vilant (TRELEGY ELLIPTA) 100-62.5-25 MCG/INH AEPB    Sig: 1 puff daily    Dispense:  1 each    Refill:  0   Return in about 1 year (around 11/01/2021), or if symptoms worsen or fail to improve, for with [facility] or Dr. Valeta Harms.    Please do your part to reduce the spread of COVID-19.

## 2020-11-01 NOTE — Progress Notes (Signed)
Synopsis: Referred in August 2019 for COPD, RUL pulmonary nodule by Joe Evens, MD  Subjective:   PATIENT ID: Joe [name] GENDER: male DOB: Feb 07, 1956, MRN: [id number]  Chief Complaint  Patient presents with  . Follow-up    No current respiratory complaints. Requesting refills.     Initial office visit: Patient has a past medical history of obstructive sleep apnea, prostate cancer as well as tobacco abuse and has been labeled with COPD in the past.  No prior PFTs.  He was recently seen by his PCP with complaints of worsening shortness of breath, 35-pack-year history of smoking, quit smoking 2 years ago, started vaping but recently quit vaping approximately 3 to 4 months ago. He is active outside, plays golf regularly and swims. 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 He gets short winded, SOB and DOE with any exertion. He feels like he has had to slow down due to his symptoms. 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 He has noticed people asking is he is "ok" are you breathing okay situation. He has a had a few episodes where he was seen in the ED. He was at work and couldn't get his breath and he was transported from work.  He has never been intubated or placed on mechanical ventilation.  He has never needed NIPPV for an exacerbation.  OV 05/12/2018: Since our last visit a PET scan was completed for further evaluation of the lung nodule seen on initial LDCT. This revealed low level uptake now with planned follow up. However there was abnormal uptake within the colon which led to a C-scope by GI revealing multiple polyps, tubular adenomas, no cancer detected and colonic diverticulosis. On 10/1 he had a LE Duplex + Right Popliteal DVT and he was started on Xarelto.  This was considered a provoked DVT following a 4-hour flight coming back from [place].  He did state that his breathing was difficult while he was in [place].  He did have difficulty  with exertion and climbing out of the [facility] when he was out with a group sightseeing.  At this point denies any additional chest pain, wheezing or chest tightness.  He has been using his medication regularly.  OV 07/07/2018: He was started on Trelegy after last office visit.  He is here today for follow-up to make sure that he is still doing well with his breathing.  He is very excited to tell me that he is feeling much better.  He has noticed a significant difference in his ability to walk further and get more done at work.  He still been able to play golf on occasion.  He feels like he is less breathless with significant exertion.  He denies any ongoing cough.  He is somewhat anxious about everything that has happened this past year.  After finding the lung nodule that [name] to be followed up on CT as well as the PET scan which led to evaluation of the colon and the multiple colon polyps.  He also has follow-up with GI shortly to discuss this again.  He also developed a blood clot and currently on Xarelto.  He also has follow-up with hematology after he completes therapy with his Xarelto.  OV 10/07/2018: increased SOB for the past 3 days. He has feeling for the past few days.  Has noticed nocturnal wheezing.  Has been using his nebulized treatments.  He is fixing to go on a golf trip  for the next couple of days.  He has been using his Trelegy inhaler regularly.  He has noticed predominantly more labored breathing with exertion.  He has not had much change in his sputum production.  He does state that he is finishing his Xarelto therapy on 3/7 to complete 3 months.  He did have a recent viral gastroenteritis about a month ago which required ER visit.  He does feel like if he could breathe a little bit better he would be back to his previous baseline.  We did discuss his prior CT imaging that was completed in November.  He is scheduled for 73-month low-dose cancer screening CT follow-up.  OV 06/15/2019: Patient  here today for follow-up regarding COPD.  Patient doing very well today currently managed with Trelegy inhaler plus albuterol.  He rarely uses his albuterol at this time.  He is recently stopped anticoagulation.  Follows with Dr. Delton [name] his hematologist.  His job position was eliminated due to Covid back in June.  In July he underwent a total hip.  Overall doing very well since then.  Has been playing golf several times a week.  He is also trying to swim regularly to help exercise.  At this time has no significant respiratory complaints or change.  Does have some mucus production throughout the day.  And constantly clearing his throat.  He does believe this is stable at this time.  OV 11/01/2020: Here today for follow-up regarding COPD.  Patient follows with Dr. Delton [name] for anticoagulation with history of DVT.  Office note reviewed from March 2022.  Also recently seen by rheumatology for gouty arthritis.  Office note also reviewed from Dr. Benjamine [name].  Overall from a respiratory standpoint doing well.  [name] refills today of his Trelegy.  He has had some issues with insurance in the past.  Will help him with samples today.  Cost at this point is able for him to afford the medicines.  His right ankle has flared up with his gouty arthritis.  He is leaving after the office visit dedicated to go to the beach.  Recently placed on allopurinol by rheumatology.    Past Medical History:  Diagnosis Date  . Bilateral shoulder bursitis   . Chronic back pain   . COPD (chronic obstructive pulmonary disease) with emphysema ([place])   . DDD (degenerative disc disease), lumbar    hx epideral injection  L5--S1   . Diverticulitis   . DVT (deep venous thrombosis) ([place]) 11/19/2018  . Dysphasia    intermittant w/ food   . Dyspnea   . Dyspnea on exertion   . GERD (gastroesophageal reflux disease)   . Gout    L great toe  . Heterozygous factor V Leiden mutation (Joe [name])   . Hiatal hernia   . Hip problem 2020   left,  seeing orthopedics  . History of acute pancreatitis    alcoholic pancreatitis [id number] and 01-30-2012  . History of colon polyps    10-17-2005  hyperplastic polyp's  . History of esophageal stricture    s/p  dilation 02-02-2017  . History of peptic ulcer 1990s  . History of pneumococcal pneumonia 2006   bilateral pneumonia w/ left lower lobe abscess treated w/ chest tube with suction for drainage  . History of pneumothorax    1984--  left spontaneous pneumothorax ,  treated w/ chest tube  . Hypertension   . Malignant neoplasm of prostate [facility]) urologist-  dr dahlstedt/  oncologist -- dr Tammi Klippel   dx  12-02-2016-- Stage T2b, Gleason 3+4,  PSA 5.99,  vol 25cc  . Numbness of left foot    outer left ankle numb-- per pt has appt. w/ neurologist  . OSA (obstructive sleep apnea)    per pt has not used cpap over year ago from 04-16-2017--- per study 01-17-2010  severe osa  . Solid nodule of lung greater than 8 mm in diameter 05/11/2018   03/2018: RUL Nodule, 74mm  . Wears glasses      Family History  Problem Relation Age of Onset  . Diabetes Mother   . Heart disease Father   . Heart disease Brother   . Cancer Neg Hx   . Colon cancer Neg Hx   . Colon polyps Neg Hx   . Neuropathy Neg Hx      Social History   Socioeconomic History  . Marital status: Married    Spouse name: Not on file  . Number of children: 2  . Years of education: Not on file  . Highest education level: Bachelor's degree (e.g., BA, AB, BS)  Occupational History  . Not on file  Tobacco Use  . Smoking status: Former Smoker    Packs/day: 1.00    Years: 30.00    Pack years: 30.00    Types: Cigarettes, E-cigarettes    Quit date: 10/03/2015    Years since quitting: 5.0  . Smokeless tobacco: Never Used  . Tobacco comment: stoped vaping 2018  Vaping Use  . Vaping Use: Former  Substance and Sexual Activity  . Alcohol use: Yes    Alcohol/week: 5.0 standard drinks    Types: 5 Glasses of wine per week  . Drug use:  No  . Sexual activity: Yes    Birth control/protection: None  Other Topics Concern  . Not on file  Social History Narrative   Lives at home with his wife   Right handed   Social Determinants of Health   Financial Resource Strain: Not on file  Food Insecurity: Not on file  Transportation [name]: Not on file  Physical Activity: Not on file  Stress: Not on file  Social Connections: Not on file  Intimate Partner Violence: Not on file     No Known Allergies   Outpatient Medications Prior to Visit  Medication Sig Dispense Refill  . acetaminophen (TYLENOL) 500 MG tablet Take 2 tablets (1,000 mg total) by mouth every 6 (six) hours as needed for mild pain. (Patient taking differently: Take 1,000 mg by mouth as needed for mild pain.) 30 tablet 0  . allopurinol (ZYLOPRIM) 100 MG tablet Take 100 mg by mouth daily.    . colchicine 0.6 MG tablet Take 1 tablet by mouth as needed. Patient takes for flares, is currently taking it- 10/03/20    . doxycycline (VIBRAMYCIN) 50 MG capsule Take 50 mg by mouth daily.    . Fluticasone-Umeclidin-Vilant (TRELEGY ELLIPTA) 100-62.5-25 MCG/INH AEPB Inhale 1 puff into the lungs daily. 60 each 4  . ipratropium-albuterol (DUONEB) 0.5-2.5 (3) MG/3ML SOLN Take 3 mLs by nebulization 4 (four) times daily.    . Multiple Vitamin (MULTIVITAMIN WITH MINERALS) TABS tablet Take 1 tablet by mouth daily.    . polyethylene glycol-electrolytes (NULYTELY) 420 g solution As directed 4000 mL 0  . rivaroxaban (XARELTO) 20 MG TABS tablet Take 1 tablet (20 mg total) by mouth daily with supper. 30 tablet 4   No facility-administered medications prior to visit.    Review of Systems  Constitutional: Negative for chills, fever, malaise/fatigue and weight  loss.  HENT: Negative for hearing loss, sore throat and tinnitus.   Eyes: Negative for blurred vision and double vision.  Respiratory: Positive for shortness of breath. Negative for cough, hemoptysis, sputum production, wheezing and  stridor.   Cardiovascular: Negative for chest pain, palpitations, orthopnea, leg swelling and PND.  Gastrointestinal: Negative for abdominal pain, constipation, diarrhea, heartburn, nausea and vomiting.  Genitourinary: Negative for dysuria, hematuria and urgency.  Musculoskeletal: Positive for joint pain. Negative for myalgias.  Skin: Negative for itching and rash.  Neurological: Negative for dizziness, tingling, weakness and headaches.  Endo/Heme/Allergies: Negative for environmental allergies. Does not bruise/bleed easily.  Psychiatric/Behavioral: Negative for depression. The patient is not nervous/anxious and does not have insomnia.   All other systems reviewed and are negative.    Objective:  Physical Exam Vitals reviewed.  Constitutional:      General: He is not in acute distress.    Appearance: He is well-developed. He is obese.  HENT:     Head: Normocephalic and atraumatic.  Eyes:     General: No scleral icterus.    Conjunctiva/sclera: Conjunctivae normal.     Pupils: Pupils are equal, round, and reactive to light.  Neck:     Vascular: No JVD.     Trachea: No tracheal deviation.  Cardiovascular:     Rate and Rhythm: Normal rate and regular rhythm.     Heart sounds: Normal heart sounds. No murmur heard.   Pulmonary:     Effort: Pulmonary effort is normal. No tachypnea, accessory muscle usage or respiratory distress.     Breath sounds: Normal breath sounds. No stridor. No wheezing, rhonchi or rales.  Abdominal:     General: Bowel sounds are normal. There is no distension.     Palpations: Abdomen is soft.     Tenderness: There is no abdominal tenderness.  Musculoskeletal:        General: Swelling and tenderness present.     Cervical back: Neck supple.     Comments: Right ankle  Lymphadenopathy:     Cervical: No cervical adenopathy.  Skin:    General: Skin is warm and dry.     Capillary Refill: Capillary refill takes less than 2 seconds.     Findings: No rash.   Neurological:     Mental Status: He is alert and oriented to person, place, and time.  Psychiatric:        Behavior: Behavior normal.     Vitals:   11/01/20 0914  BP: 126/84  Pulse: 84  Temp: (!) 97.3 F (36.3 C)  TempSrc: Temporal  SpO2: 96%  Weight: 222 lb (100.7 kg)  Height: 6' (1.829 m)   96% on RA BMI Readings from Last 3 Encounters:  11/01/20 30.11 kg/m  10/12/20 30.43 kg/m  10/03/20 30.41 kg/m   Wt Readings from Last 3 Encounters:  11/01/20 222 lb (100.7 kg)  10/12/20 224 lb 6.4 oz (101.8 kg)  10/03/20 224 lb 3.2 oz (101.7 kg)     CBC    Component Value Date/Time   WBC 7.9 08/27/2020 0920   RBC 4.70 08/27/2020 0920   HGB 14.4 08/27/2020 0920   HCT 45.6 08/27/2020 0920   PLT 328 08/27/2020 0920   MCV 97.0 08/27/2020 0920   MCH 30.6 08/27/2020 0920   MCHC 31.6 08/27/2020 0920   RDW 13.0 08/27/2020 0920   LYMPHSABS 2.7 08/27/2020 0920   MONOABS 0.7 08/27/2020 0920   EOSABS 0.4 08/27/2020 0920   BASOSABS 0.1 08/27/2020 0920  Chest Imaging: LDCT 03/2018 - RUL Lung nodule, approximately 73mm with areas of surrounding scar, significant BL upper lobe predominant emphysema The patient's images have been independently reviewed by me.    PET Scan: 03/23/2018 Mild uptake in the RUL density. Extensive colonic uptake from PET scan  07/20/2018: CT chest  Lung RADS 2, three-vessel coronary disease, stable right upper lobe pulmonary nodule. The patient's images have been independently reviewed by me.    December 2020: CT chest lung cancer screening pending  Vascular duplex 09/25/2020: Right lower extremity short segment popliteal DVT.  Lung cancer screening CT 08/03/2019: Lung RADS 2S.  Evidence of centrilobular and paraseptal emphysema. Three-vessel coronary disease.  No concerning nodules. The patient's images have been independently reviewed by me.    Pulmonary Functions Testing Results: PFT Results Latest Ref Rng & Units 03/22/2018  FVC-Pre L  3.30  FVC-Predicted Pre % 63  FVC-Post L 3.40  FVC-Predicted Post % 65  Pre FEV1/FVC % % 64  Post FEV1/FCV % % 69  FEV1-Pre L 2.12  FEV1-Predicted Pre % 54  FEV1-Post L 2.36  DLCO uncorrected ml/min/mmHg 18.65  DLCO UNC% % 52  DLVA Predicted % 69  TLC L 4.69  TLC % Predicted % 62  RV % Predicted % 38    FeNO: None  Pathology: None   Echocardiogram: None   Heart Catheterization: None     Assessment & Plan:   Moderate COPD (chronic obstructive pulmonary disease) ([place])  Former smoker - Plan: Ambulatory Referral for Lung Cancer Scre  Seasonal allergies  History of deep venous thrombosis  On continuous oral anticoagulation  Gouty arthritis   Discussion:  65 year old gentleman, moderate COPD maintained on Trelegy.  Respiratory status stable at this time.  He is however in need of his lung cancer screening follow-up.  He has not completed this for December of this past year.  Will reach out to her lung cancer screening coordinator and help reestablish.  We discussed the importance of his annual lung cancer screening today in the office.  Also has a flare of his right ankle gouty arthritis.  He is leaving to go out of town today for a golf trip.  Plan: Trelegy refills. Refills with albuterol as needed. Anticoagulation per hematology, discussion of possibly coming off of this later. Continue antihistamines as needed for seasonal allergies. Reestablish care with our lung cancer screening program. I will message Eric Form, NP and Doroteo Glassman, RN.  Prescription for prednisone to help his gouty arthritis.  Return to clinic in 1 year or as needed.     Current Outpatient Medications:  .  acetaminophen (TYLENOL) 500 MG tablet, Take 2 tablets (1,000 mg total) by mouth every 6 (six) hours as needed for mild pain. (Patient taking differently: Take 1,000 mg by mouth as needed for mild pain.), Disp: 30 tablet, Rfl: 0 .  allopurinol (ZYLOPRIM) 100 MG tablet, Take 100 mg by  mouth daily., Disp: , Rfl:  .  colchicine 0.6 MG tablet, Take 1 tablet by mouth as needed. Patient takes for flares, is currently taking it- 10/03/20, Disp: , Rfl:  .  doxycycline (VIBRAMYCIN) 50 MG capsule, Take 50 mg by mouth daily., Disp: , Rfl:  .  Fluticasone-Umeclidin-Vilant (TRELEGY ELLIPTA) 100-62.5-25 MCG/INH AEPB, Inhale 1 puff into the lungs daily., Disp: 60 each, Rfl: 4 .  ipratropium-albuterol (DUONEB) 0.5-2.5 (3) MG/3ML SOLN, Take 3 mLs by nebulization 4 (four) times daily., Disp: , Rfl:  .  Multiple Vitamin (MULTIVITAMIN WITH MINERALS) TABS tablet, Take  1 tablet by mouth daily., Disp: , Rfl:  .  polyethylene glycol-electrolytes (NULYTELY) 420 g solution, As directed, Disp: 4000 mL, Rfl: 0 .  rivaroxaban (XARELTO) 20 MG TABS tablet, Take 1 tablet (20 mg total) by mouth daily with supper., Disp: 30 tablet, Rfl: 4   Garner Nash, DO [facility] 11/01/2020 9:30 AM

## 2020-11-02 ENCOUNTER — Other Ambulatory Visit: Payer: Self-pay | Admitting: *Deleted

## 2020-11-02 DIAGNOSIS — Z87891 Personal history of nicotine dependence: Secondary | ICD-10-CM

## 2020-11-06 ENCOUNTER — Telehealth: Payer: Self-pay | Admitting: Acute Care

## 2020-11-06 NOTE — Telephone Encounter (Signed)
Thanks so much Rodena Piety!!

## 2020-11-06 NOTE — Telephone Encounter (Signed)
-----   Message from Garner Nash, DO sent at 11/01/2020  9:48 AM EDT ----- He is 3 months behind on getting his scan? Can you guys just order or does he need to see you? He is ok with you just ordering  Thanks Brad

## 2020-11-06 NOTE — Telephone Encounter (Signed)
I have spoke with the patient and he is scheduled at [facility] on 11/13/2020. I am trying to get his CT approved

## 2020-11-06 NOTE — Telephone Encounter (Signed)
Ladies, can we get this patient scheduled? Thanks so much

## 2020-11-13 ENCOUNTER — Ambulatory Visit (HOSPITAL_COMMUNITY)
Admission: RE | Admit: 2020-11-13 | Discharge: 2020-11-13 | Disposition: A | Discharge status: Home / Self Care | Payer: 59 | Source: Ambulatory Visit | Attending: Acute Care | Admitting: Acute Care

## 2020-11-13 ENCOUNTER — Other Ambulatory Visit: Payer: Self-pay

## 2020-11-13 DIAGNOSIS — Z87891 Personal history of nicotine dependence: Secondary | ICD-10-CM | POA: Diagnosis present

## 2020-11-14 ENCOUNTER — Ambulatory Visit (HOSPITAL_COMMUNITY)
Admission: RE | Admit: 2020-11-14 | Discharge: 2020-11-14 | Disposition: A | Discharge status: Home / Self Care | Payer: 59 | Source: Ambulatory Visit | Attending: Family Medicine | Admitting: Family Medicine

## 2020-11-14 ENCOUNTER — Other Ambulatory Visit: Payer: Self-pay | Admitting: Family Medicine

## 2020-11-14 ENCOUNTER — Other Ambulatory Visit: Payer: Self-pay

## 2020-11-14 ENCOUNTER — Encounter (HOSPITAL_COMMUNITY): Payer: Self-pay | Admitting: Radiology

## 2020-11-14 ENCOUNTER — Other Ambulatory Visit (HOSPITAL_COMMUNITY)
Admission: RE | Admit: 2020-11-14 | Discharge: 2020-11-14 | Disposition: A | Discharge status: Home / Self Care | Payer: 59 | Source: Ambulatory Visit | Attending: Family Medicine | Admitting: Family Medicine

## 2020-11-14 DIAGNOSIS — K219 Gastro-esophageal reflux disease without esophagitis: Secondary | ICD-10-CM | POA: Diagnosis present

## 2020-11-14 DIAGNOSIS — K5792 Diverticulitis of intestine, part unspecified, without perforation or abscess without bleeding: Secondary | ICD-10-CM

## 2020-11-14 DIAGNOSIS — J449 Chronic obstructive pulmonary disease, unspecified: Secondary | ICD-10-CM | POA: Diagnosis present

## 2020-11-14 DIAGNOSIS — I2510 Atherosclerotic heart disease of native coronary artery without angina pectoris: Secondary | ICD-10-CM | POA: Diagnosis present

## 2020-11-14 DIAGNOSIS — E6609 Other obesity due to excess calories: Secondary | ICD-10-CM | POA: Diagnosis present

## 2020-11-14 LAB — COMPREHENSIVE METABOLIC PANEL
ALT: 17 U/L (ref 0–44)
AST: 20 U/L (ref 15–41)
Albumin: 3.5 g/dL (ref 3.5–5.0)
Alkaline Phosphatase: 58 U/L (ref 38–126)
Anion gap: 10 (ref 5–15)
BUN: 13 mg/dL (ref 8–23)
CO2: 27 mmol/L (ref 22–32)
Calcium: 9.4 mg/dL (ref 8.9–10.3)
Chloride: 101 mmol/L (ref 98–111)
Creatinine, Ser: 0.85 mg/dL (ref 0.61–1.24)
GFR, Estimated: >60 mL/min (ref >60)
Glucose, Bld: 103 mg/dL — ABNORMAL HIGH (ref 70–99)
Potassium: 4.3 mmol/L (ref 3.5–5.1)
Sodium: 138 mmol/L (ref 135–145)
Total Bilirubin: 0.7 mg/dL (ref 0.3–1.2)
Total Protein: 7.5 g/dL (ref 6.5–8.1)

## 2020-11-14 LAB — CBC
HCT: 42.1 % (ref 39.0–52.0)
Hemoglobin: 13.5 g/dL (ref 13.0–17.0)
MCH: 30.9 pg (ref 26.0–34.0)
MCHC: 32.1 g/dL (ref 30.0–36.0)
MCV: 96.3 fL (ref 80.0–100.0)
Platelets: 242 10*3/uL (ref 150–400)
RBC: 4.37 MIL/uL (ref 4.22–5.81)
RDW: 13.9 % (ref 11.5–15.5)
WBC: 8.6 10*3/uL (ref 4.0–10.5)
nRBC: 0 % (ref 0.0–0.2)

## 2020-11-14 LAB — C-REACTIVE PROTEIN: CRP: 10.1 mg/dL — ABNORMAL HIGH (ref <1.0)

## 2020-11-14 LAB — SEDIMENTATION RATE: Sed Rate: 81 mm/hr — ABNORMAL HIGH (ref 0–16)

## 2020-11-14 MED ORDER — IOHEXOL 300 MG/ML  SOLN
100.0000 mL | Freq: Once | INTRAMUSCULAR | Status: AC | PRN
  Administered 2020-11-14: 100 mL via INTRAVENOUS

## 2020-11-14 MED ORDER — IOHEXOL 9 MG/ML PO SOLN
ORAL | Status: AC
  Filled 2020-11-14: qty 1000

## 2020-11-15 ENCOUNTER — Telehealth: Payer: Self-pay | Admitting: Internal Medicine

## 2020-11-15 NOTE — Telephone Encounter (Signed)
Called pt. He reports he was placed on ABX for diverticulits 2 days ago. He is taking flagyl 2 po BID, cipro taking twice a day for 10 days. Patient scheduled for procedure 4/25. Please advise Dr. Gala Romney

## 2020-11-15 NOTE — Telephone Encounter (Signed)
Noted, message sent to endo to cancel procedure

## 2020-11-15 NOTE — Telephone Encounter (Signed)
[phone] patient was put on 2 antibiotics for his diverticulitis and he has an upcoming procedure scheduled.  It is ok since he is on those medications.  Please call him

## 2020-11-15 NOTE — Telephone Encounter (Signed)
Communication noted.  CT documented sigmoid diverticulitis yesterday.  Cancel 4/25 procedure.  Office visit with [place] in 1 month to reassess and regroup.

## 2020-11-15 NOTE — Telephone Encounter (Signed)
Spoke with pt. Pt is aware that apt for 4/25 procedure, will be cancelled. Pt was asked to come back in office in 1 month. Pt wants to look at his scheduled before scheduling 1 month f/u. Pt will call back to schedule apt.

## 2020-11-22 ENCOUNTER — Other Ambulatory Visit (HOSPITAL_COMMUNITY): Payer: 59

## 2020-11-22 ENCOUNTER — Encounter (HOSPITAL_COMMUNITY): Admission: RE | Admit: 2020-11-22 | Payer: 59 | Source: Ambulatory Visit

## 2020-11-26 ENCOUNTER — Ambulatory Visit (HOSPITAL_COMMUNITY): Admission: RE | Admit: 2020-11-26 | Payer: 59 | Source: Home / Self Care | Admitting: Internal Medicine

## 2020-11-26 ENCOUNTER — Encounter (HOSPITAL_COMMUNITY): Admission: RE | Payer: Self-pay | Source: Home / Self Care

## 2020-11-26 SURGERY — COLONOSCOPY WITH PROPOFOL
Anesthesia: Monitor Anesthesia Care

## 2020-11-27 ENCOUNTER — Other Ambulatory Visit: Payer: Self-pay | Admitting: *Deleted

## 2020-11-27 DIAGNOSIS — Z87891 Personal history of nicotine dependence: Secondary | ICD-10-CM

## 2020-11-27 NOTE — Progress Notes (Signed)
Please call patient and let them  know their  low dose Ct was read as a Lung RADS 2: nodules that are benign in appearance and behavior with a very low likelihood of becoming a clinically active cancer due to size or lack of growth. Recommendation per radiology is for a repeat LDCT in 12 months. .Please let them  know we will order and schedule their  annual screening scan for 11/2021 Please let them  know there was notation of CAD on their  scan.  Please remind the patient  that this is a non-gated exam therefore degree or severity of disease  cannot be determined. Please have them  follow up with their PCP regarding potential risk factor modification, dietary therapy or pharmacologic therapy if clinically indicated. Pt.  is not  currently on statin therapy. Please place order for annual  screening scan for  11/2021 and fax results to PCP. Thanks so much.  Have patient follow up with PCP re: CAD and Hepatic steatosis. Thanks so much

## 2020-12-10 ENCOUNTER — Institutional Professional Consult (permissible substitution): Payer: 59 | Admitting: Neurology

## 2020-12-31 ENCOUNTER — Other Ambulatory Visit: Payer: Self-pay

## 2020-12-31 ENCOUNTER — Emergency Department (HOSPITAL_COMMUNITY)
Admission: EM | Admit: 2020-12-31 | Discharge: 2020-12-31 | Disposition: A | Discharge status: Home / Self Care | Payer: 59 | Attending: Emergency Medicine | Admitting: Emergency Medicine

## 2020-12-31 DIAGNOSIS — Z7901 Long term (current) use of anticoagulants: Secondary | ICD-10-CM | POA: Diagnosis not present

## 2020-12-31 DIAGNOSIS — Z96649 Presence of unspecified artificial hip joint: Secondary | ICD-10-CM | POA: Diagnosis not present

## 2020-12-31 DIAGNOSIS — J449 Chronic obstructive pulmonary disease, unspecified: Secondary | ICD-10-CM | POA: Insufficient documentation

## 2020-12-31 DIAGNOSIS — I10 Essential (primary) hypertension: Secondary | ICD-10-CM | POA: Insufficient documentation

## 2020-12-31 DIAGNOSIS — M545 Low back pain, unspecified: Secondary | ICD-10-CM | POA: Diagnosis not present

## 2020-12-31 DIAGNOSIS — Z8546 Personal history of malignant neoplasm of prostate: Secondary | ICD-10-CM | POA: Insufficient documentation

## 2020-12-31 DIAGNOSIS — Z87891 Personal history of nicotine dependence: Secondary | ICD-10-CM | POA: Insufficient documentation

## 2020-12-31 MED ORDER — ONDANSETRON 4 MG PO TBDP
4.0000 mg | ORAL_TABLET | Freq: Once | ORAL | Status: AC
  Administered 2020-12-31: 4 mg via ORAL
  Filled 2020-12-31: qty 1

## 2020-12-31 MED ORDER — OXYCODONE-ACETAMINOPHEN 5-325 MG PO TABS: 1.0000 | ORAL_TABLET | Freq: Four times a day (QID) | ORAL | 0 refills | Status: DC | PRN

## 2020-12-31 MED ORDER — DEXAMETHASONE 4 MG PO TABS
6.0000 mg | ORAL_TABLET | Freq: Once | ORAL | Status: AC
  Administered 2020-12-31: 6 mg via ORAL
  Filled 2020-12-31: qty 2

## 2020-12-31 MED ORDER — ONDANSETRON 4 MG PO TBDP: 4.0000 mg | ORAL_TABLET | Freq: Three times a day (TID) | ORAL | 0 refills | Status: DC | PRN

## 2020-12-31 MED ORDER — HYDROMORPHONE HCL 1 MG/ML IJ SOLN
1.0000 mg | Freq: Once | INTRAMUSCULAR | Status: AC
  Administered 2020-12-31: 1 mg via INTRAMUSCULAR
  Filled 2020-12-31: qty 1

## 2020-12-31 MED ORDER — TIZANIDINE HCL 2 MG PO TABS: 2.0000 mg | ORAL_TABLET | Freq: Four times a day (QID) | ORAL | 0 refills | Status: AC | PRN

## 2020-12-31 NOTE — ED Triage Notes (Signed)
Pt arrives to ED from home via POV. Reports back pain onset yesterday. Pt denies injury but endorses hx of bulging and ruptured disc, back pain. Pt reports that he feels numbness to left side of back then will feel a pain that "makes you want to throw your hands up in the air, drop to your knees, and yell obscenities." He reports that pain sometimes radiates to right upper buttock area but does not radiate down leg. He reports that no urinary symptoms but that he normally has a BM daily but did not have a BM yesterday. Pt able to ambulate slowly and with assistance.

## 2020-12-31 NOTE — ED Provider Notes (Signed)
[facility] EMERGENCY DEPARTMENT Provider Note   CSN: [id number] Arrival date & time: 12/31/20  1638     History Chief Complaint  Patient presents with  . Back Pain    [name] is a 65 y.o. male.   Patient presents to ER chief complaint of left-sided lower back pain.  States that it started Sunday midday while he was finishing up from church.  He stood to get up when he had sudden sharp pain in the left lower back.  Did not radiate down his leg.  He was able to walk home with assistance.  Denies any fall or trauma.  He states that the few days before he had been playing basketball with his grandchildren and did not have any pain Sunday morning when he woke up, until about midday.  No reports of fevers or cough or vomiting or diarrhea.  No reports of new numbness or weakness.  No loss of bowel or bladder control.  Patient states that he has a history of similar lower back pain a few years ago for which she had seen orthopedic specialist in which resolved after some time.        Past Medical History:  Diagnosis Date  . Bilateral shoulder bursitis   . Chronic back pain   . COPD (chronic obstructive pulmonary disease) with emphysema ([place])   . DDD (degenerative disc disease), lumbar    hx epideral injection  L5--S1   . Diverticulitis   . DVT (deep venous thrombosis) ([place]) 11/19/2018  . Dysphasia    intermittant w/ food   . Dyspnea   . Dyspnea on exertion   . GERD (gastroesophageal reflux disease)   . Gout    L great toe  . Heterozygous factor V Leiden mutation ([place])   . Hiatal hernia   . Hip problem 2020   left, seeing orthopedics  . History of acute pancreatitis    alcoholic pancreatitis [id number] and 01-30-2012  . History of colon polyps    10-17-2005  hyperplastic polyp's  . History of esophageal stricture    s/p  dilation 02-02-2017  . History of peptic ulcer 1990s  . History of pneumococcal pneumonia 2006   bilateral pneumonia w/ left  lower lobe abscess treated w/ chest tube with suction for drainage  . History of pneumothorax    1984--  left spontaneous pneumothorax ,  treated w/ chest tube  . Hypertension   . Malignant neoplasm of prostate [facility]) urologist-  dr dahlstedt/  oncologist -- dr Tammi Klippel   dx 12-02-2016-- Stage T2b, Gleason 3+4,  PSA 5.99,  vol 25cc  . Numbness of left foot    outer left ankle numb-- per pt has appt. w/ neurologist  . OSA (obstructive sleep apnea)    per pt has not used cpap over year ago from 04-16-2017--- per study 01-17-2010  severe osa  . Solid nodule of lung greater than 8 mm in diameter 05/11/2018   03/2018: RUL Nodule, 38mm  . Wears glasses     Patient Active Problem List   Diagnosis Date Noted  . Idiopathic chronic gout, unspecified site, without tophus (tophi) 10/03/2020  . Bilateral shoulder pain 10/03/2020  . Diverticulitis of colon 08/10/2020  . Alcohol abuse 10/12/2019  . Acute alcoholic pancreatitis 70/17/7939  . History of hip replacement 02/07/2019  . Avascular necrosis of femur ([place]) 01/05/2019  . Lumbar radiculopathy 01/05/2019  . History of colonic polyps 08/19/2018  . Esophageal stenosis 08/19/2018  . DVT (deep venous  thrombosis) ([place]) 05/11/2018  . Solid nodule of lung greater than 8 mm in diameter 05/11/2018  . Abnormal CT scan, colon 04/13/2018  . Obstructive sleep apnea treated with continuous positive airway pressure (CPAP) 09/29/2017  . GERD (gastroesophageal reflux disease) 04/07/2017  . Dysphagia 01/29/2017  . Malignant neoplasm of prostate ([place]) 01/19/2017  . Pancreatitis, alcoholic 30/86/5784  . Alcoholism ([place]) 11/11/2011  . Tobacco abuse 11/11/2011  . Thrombocytopenia ([place]) 11/11/2011  . HYPERSOMNIA, ASSOCIATED WITH SLEEP APNEA 09/27/2009  . COPD (chronic obstructive pulmonary disease) ([place]) 09/07/2009  . Pneumothorax 09/07/2009  . BURSITIS 09/07/2009  . FATIGUE / MALAISE 09/07/2009  . SHORTNESS OF BREATH 09/07/2009  . PNEUMOTHORAX 09/07/2009     Past Surgical History:  Procedure Laterality Date  . BIOPSY  02/02/2017   Procedure: BIOPSY;  Surgeon: Daneil Dolin, MD;  Location[facility];  Service: Endoscopy;;  duodenal bx's, esophgeal bx's  . BIOPSY  07/30/2017   Procedure: BIOPSY;  Surgeon: Daneil Dolin, MD;  Location[facility];  Service: Endoscopy;;  esophagus  . CARDIOVASCULAR STRESS TEST  09/12/2009   normal nuclear perfusion study w/ no ischemia /  normal LV function and wall motion , ef 57%  . COLONOSCOPY  2007   Dr. Gala Romney: hyperplastic polyps, internal hemorrhoids   . COLONOSCOPY WITH PROPOFOL N/A 05/03/2018   Dr. Gala Romney: diverticulosis in entire colon, multiple polyps in sigmoid, at splenic flexure, and ascending colon, not all polyps removed. Tubular adenomas and inflammatory polyps. Needs 3 year surveilance  . EPIDURAL BLOCK INJECTION  2020  . ESOPHAGOGASTRODUODENOSCOPY (EGD) WITH PROPOFOL N/A 02/02/2017   Dr. Gala Romney: esophageal stenosis s/p dilation and biopsy, medium sized hiatal hernia, normal duodenum  . ESOPHAGOGASTRODUODENOSCOPY (EGD) WITH PROPOFOL N/A 07/30/2017   Dr. Gala Romney: esophageal stenosis s/p dilation and biopsy, medium-sized hiatal hernia, normal duodenum  . HEMORRHOID SURGERY  01-21-2008    [facility]  . IR RADIOLOGIST EVAL & MGMT  08/29/2020  . MALONEY DILATION N/A 02/02/2017   Procedure: Venia Minks DILATION;  Surgeon: Daneil Dolin, MD;  [facility];  Service: Endoscopy;  Laterality: N/A;  . Venia Minks DILATION N/A 07/30/2017   Procedure: Venia Minks DILATION;  Surgeon: Daneil Dolin, MD;  [facility];  Service: Endoscopy;  Laterality: N/A;  . POLYPECTOMY  05/03/2018   Procedure: POLYPECTOMY;  Surgeon: Daneil Dolin, MD;  [facility];  Service: Endoscopy;;  ascending colon polyp, sigmoid colon polyps (multiple)  . RADIOACTIVE SEED IMPLANT N/A 04/23/2017   Procedure: RADIOACTIVE SEED IMPLANT/BRACHYTHERAPY IMPLANT;  Surgeon: Franchot Gallo, MD;  Location: [facility];  Service: Urology;  Laterality: N/A;  . SHOULDER ARTHROSCOPY  08/05/2003  . SPACE OAR INSTILLATION N/A 04/23/2017   Procedure: SPACE OAR INSTILLATION;  Surgeon: Franchot Gallo, MD;  Location: [facility];  Service: Urology;  Laterality: N/A;  . TRANSTHORACIC ECHOCARDIOGRAM  03/28/2009   mild LVH, ef 55-60%/ mild aorta calcification  . VASECTOMY  08/05/1983  . VIDEO ASSISTED THORACOSCOPY (VATS)/DECORTICATION Left        Family History  Problem Relation Age of Onset  . Diabetes Mother   . Heart disease Father   . Heart disease Brother   . Cancer Neg Hx   . Colon cancer Neg Hx   . Colon polyps Neg Hx   . Neuropathy Neg Hx     Social History   Tobacco Use  . Smoking status: Former Smoker    Packs/day: 1.00    Years: 30.00  Pack years: 30.00    Types: Cigarettes, E-cigarettes    Quit date: 10/03/2015    Years since quitting: 5.2  . Smokeless tobacco: Never Used  . Tobacco comment: stoped vaping 2018  Vaping Use  . Vaping Use: Former  Substance Use Topics  . Alcohol use: Yes    Alcohol/week: 5.0 standard drinks    Types: 5 Glasses of wine per week  . Drug use: No    Home Medications Prior to Admission medications   Medication Sig Start Date End Date Taking? Authorizing Provider  oxyCODONE-acetaminophen (PERCOCET/ROXICET) 5-325 MG tablet Take 1 tablet by mouth every 6 (six) hours as needed for severe pain. 12/31/20  Yes Karsen Fellows, Greggory Brandy, MD  tiZANidine (ZANAFLEX) 2 MG tablet Take 1 tablet (2 mg total) by mouth every 6 (six) hours as needed for up to 10 days for muscle spasms. 12/31/20 01/10/21 Yes Luna Fuse, MD  acetaminophen (TYLENOL) 500 MG tablet Take 2 tablets (1,000 mg total) by mouth every 6 (six) hours as needed for mild pain. Patient taking differently: Take 1,000 mg by mouth every 6 (six) hours as needed for moderate pain. 08/21/20   Meuth, Brooke A, PA-C  acidophilus (RISAQUAD) CAPS capsule Take 1 capsule by mouth daily.     [provider]  allopurinol (ZYLOPRIM) 100 MG tablet Take 100 mg by mouth daily. 09/17/20   [provider]  doxycycline (VIBRAMYCIN) 50 MG capsule Take 50 mg by mouth daily. 08/26/20   [provider]  Fluticasone-Umeclidin-Vilant (TRELEGY ELLIPTA) 100-62.5-25 MCG/INH AEPB Inhale 1 puff into the lungs daily. 11/01/20   Icard, Octavio Graves, DO  Fluticasone-Umeclidin-Vilant (TRELEGY ELLIPTA) 100-62.5-25 MCG/INH AEPB 1 puff daily Patient not taking: No sig reported 11/01/20   Icard, Bradley L, DO  ipratropium-albuterol (DUONEB) 0.5-2.5 (3) MG/3ML SOLN Take 3 mLs by nebulization every 6 (six) hours as needed (shortness of breath). 10/09/20   [provider]  Multiple Vitamin (MULTIVITAMIN WITH MINERALS) TABS tablet Take 1 tablet by mouth daily. 08/22/20   Meuth, Blaine Hamper, PA-C  polyethylene glycol-electrolytes (NULYTELY) 420 g solution As directed 10/12/20   Rourk, [name], MD  predniSONE (DELTASONE) 10 MG tablet Take 2 tablets (20 mg total) by mouth daily with breakfast. Patient not taking: No sig reported 11/01/20   Icard, Bradley L, DO  psyllium (METAMUCIL SMOOTH TEXTURE) 28 % packet Take 1 packet by mouth daily.    [provider]  rivaroxaban (XARELTO) 20 MG TABS tablet Take 1 tablet (20 mg total) by mouth daily with supper. 10/26/20   Harriett Rush, PA-C  albuterol (PROVENTIL) (2.5 MG/3ML) 0.083% nebulizer solution Take 3 mLs (2.5 mg total) by nebulization every 6 (six) hours as needed for wheezing or shortness of breath. Patient not taking: Reported on 03/14/2020 03/22/18 05/23/20  Garner Nash, DO    Allergies    Patient has no known allergies.  Review of Systems   Review of Systems  Constitutional: Negative for fever.  HENT: Negative for ear pain and sore throat.   Eyes: Negative for pain.  Respiratory: Negative for cough.   Cardiovascular: Negative for chest pain.  Gastrointestinal: Negative for abdominal pain.  Genitourinary: Negative  for flank pain.  Musculoskeletal: Positive for back pain.  Skin: Negative for color change and rash.  Neurological: Negative for syncope.  All other systems reviewed and are negative.   Physical Exam Updated Vital Signs BP 120/77 (BP Location: Right Arm)   Pulse 63   Temp (!) 97.4 F (36.3 C) (Oral)  Resp 16   Ht 6\' 1"  (1.854 m)   Wt 97.5 kg   SpO2 96%   BMI 28.37 kg/m   Physical Exam Constitutional:      General: He is not in acute distress.    Appearance: He is well-developed.  HENT:     Head: Normocephalic.     Nose: Nose normal.  Eyes:     Extraocular Movements: Extraocular movements intact.  Cardiovascular:     Rate and Rhythm: Normal rate.  Pulmonary:     Effort: Pulmonary effort is normal.  Genitourinary:    Comments: Normal perineal exam no numbness.  No saddle anesthesia noted. Musculoskeletal:     Comments: Tenderness to the L4-5 left paraspinal region.  Skin:    Coloration: Skin is not jaundiced.  Neurological:     Mental Status: He is alert. Mental status is at baseline.     ED Results / Procedures / Treatments   Labs (all labs ordered are listed, but only abnormal results are displayed) Labs Reviewed - No data to display  EKG None  Radiology No results found.  Procedures Procedures   Medications Ordered in ED Medications  HYDROmorphone (DILAUDID) injection 1 mg (1 mg Intramuscular Given 12/31/20 0820)  ondansetron (ZOFRAN-ODT) disintegrating tablet 4 mg (4 mg Oral Given 12/31/20 0818)  dexamethasone (DECADRON) tablet 6 mg (6 mg Oral Given 12/31/20 0865)    ED Course  I have reviewed the triage vital signs and the nursing notes.  Pertinent labs & imaging results that were available during my care of the patient were reviewed by me and considered in my medical decision making (see chart for details).    MDM Rules/Calculators/A&P                          Patient has no new numbness or weakness.  No reported fevers.  Symptom onset was  yesterday.  His presentation is consistent with herniated disc, described similar to his prior episode a few years ago.  Given Dilaudid here and Decadron.  Advised to follow-up again with his orthopedic surgeons.  Advised immediate return for new numbness or weakness, worsening pain or loss of bowel or bladder function.  Patient expressed understanding, discharged home in stable condition.  Final Clinical Impression(s) / ED Diagnoses Final diagnoses:  Acute left-sided low back pain without sciatica    Rx / DC Orders ED Discharge Orders         Ordered    oxyCODONE-acetaminophen (PERCOCET/ROXICET) 5-325 MG tablet  Every 6 hours PRN        12/31/20 0843    tiZANidine (ZANAFLEX) 2 MG tablet  Every 6 hours PRN        12/31/20 0843           Luna Fuse, MD 12/31/20 [phone]

## 2020-12-31 NOTE — Discharge Instructions (Addendum)
Call your primary care doctor or specialist as discussed in the next 2-3 days.  Decrease the amount of Tylenol you are taking as the prescription medication today (Percocet) has 325 mg of Tylenol.  Return immediately back to the ER if:  Your symptoms worsen within the next 12-24 hours. You develop new symptoms such as new fevers, persistent vomiting, new pain, shortness of breath, or new weakness or numbness, or if you have any other concerns.

## 2021-01-08 ENCOUNTER — Telehealth: Payer: Self-pay

## 2021-01-08 ENCOUNTER — Encounter: Payer: Self-pay | Admitting: Internal Medicine

## 2021-01-08 ENCOUNTER — Ambulatory Visit (INDEPENDENT_AMBULATORY_CARE_PROVIDER_SITE_OTHER): Payer: 59 | Admitting: Internal Medicine

## 2021-01-08 VITALS — BP 131/81 | HR 100 | Temp 97.7°F | Ht 73.0 in | Wt 219.0 lb

## 2021-01-08 DIAGNOSIS — K5732 Diverticulitis of large intestine without perforation or abscess without bleeding: Secondary | ICD-10-CM | POA: Diagnosis not present

## 2021-01-08 DIAGNOSIS — Z86010 Personal history of colonic polyps: Secondary | ICD-10-CM | POA: Diagnosis not present

## 2021-01-08 DIAGNOSIS — R1319 Other dysphagia: Secondary | ICD-10-CM | POA: Diagnosis not present

## 2021-01-08 NOTE — Patient Instructions (Signed)
As discussed, we will set up a surveillance colonoscopy due to a history of colonic polyps.  ASA 3/propofol  You will need to hold your Xarelto for 2 days.  We need to get the okay from Dr. Delton Coombes that this is acceptable without "bridging" with Lovenox shots  Further recommendations to follow after colonoscopy has been performed.  It was good seeing you today.

## 2021-01-08 NOTE — Telephone Encounter (Signed)
Dr.Katragadda,  Dr.Rourk has seen this patient in the office to schedule a surveillance colonoscopy and we need to know if it is ok for the patient to stop his Xarelto for 2 days prior to the procedure? Also, can he do this without bridging with Lovenox?    Please advise.  Thank you!

## 2021-01-08 NOTE — Progress Notes (Signed)
Primary Care Physician:  [name], MD Primary Gastroenterologist:  Dr. Gala Romney  Pre-Procedure History & Physical: HPI:  [name] is a 65 y.o. male here to set up a surveillance colonoscopy.  History of hyperplastic and adenomatous polyps removed 2019; due for surveillance at this time.  He has had complicated diverticulitis with recurrence in April.  This was put off surveillance examination.  He is now doing well off antibiotics.  No abdominal pain.  Reports normal bowel function.  Had significant back issues for which he has underwent an injection recently has been given a steroid Dosepak. GERD well controlled on PPI.  No dysphagia. Patient tells me he has had fluctuating factor V levels and he has been on and off of Xarelto over time.  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  Past Medical History:  Diagnosis Date  . Bilateral shoulder bursitis   . Chronic back pain   . COPD (chronic obstructive pulmonary disease) with emphysema ([place])   . DDD (degenerative disc disease), lumbar    hx epideral injection  L5--S1   . Diverticulitis   . DVT (deep venous thrombosis) ([place]) 11/19/2018  . Dysphasia    intermittant w/ food   . Dyspnea   . Dyspnea on exertion   . GERD (gastroesophageal reflux disease)   . Gout    L great toe  . Heterozygous factor V Leiden mutation ([place])   . Hiatal hernia   . Hip problem 2020   left, seeing orthopedics  . History of acute pancreatitis    alcoholic pancreatitis [id number] and 01-30-2012  . History of colon polyps    10-17-2005  hyperplastic polyp's  . History of esophageal stricture    s/p  dilation 02-02-2017  . History of peptic ulcer 1990s  . History of pneumococcal pneumonia 2006   bilateral pneumonia w/ left lower lobe abscess treated w/ chest tube with suction for drainage  . History of pneumothorax    1984--  left spontaneous pneumothorax ,  treated w/ chest tube  . Hypertension   . Malignant neoplasm of prostate [facility])  urologist-  dr dahlstedt/  oncologist -- dr Tammi Klippel   dx 12-02-2016-- Stage T2b, Gleason 3+4,  PSA 5.99,  vol 25cc  . Numbness of left foot    outer left ankle numb-- per pt has appt. w/ neurologist  . OSA (obstructive sleep apnea)    per pt has not used cpap over year ago from 04-16-2017--- per study 01-17-2010  severe osa  . Solid nodule of lung greater than 8 mm in diameter 05/11/2018   03/2018: RUL Nodule, 37mm  . Wears glasses     Past Surgical History:  Procedure Laterality Date  . BIOPSY  02/02/2017   Procedure: BIOPSY;  Surgeon: Daneil Dolin, MD;  [facility];  Service: Endoscopy;;  duodenal bx's, esophgeal bx's  . BIOPSY  07/30/2017   Procedure: BIOPSY;  Surgeon: Daneil Dolin, MD;  [facility];  Service: Endoscopy;;  esophagus  . CARDIOVASCULAR STRESS TEST  09/12/2009   normal nuclear perfusion study w/ no ischemia /  normal LV function and wall motion , ef 57%  . COLONOSCOPY  2007   Dr. Gala Romney: hyperplastic polyps, internal hemorrhoids   . COLONOSCOPY WITH PROPOFOL N/A 05/03/2018   Dr. Gala Romney: diverticulosis in entire colon, multiple polyps in sigmoid, at splenic flexure, and ascending colon, not all polyps removed. Tubular adenomas and inflammatory polyps. Needs 3 year surveilance  . EPIDURAL  BLOCK INJECTION  2020  . ESOPHAGOGASTRODUODENOSCOPY (EGD) WITH PROPOFOL N/A 02/02/2017   Dr. Gala Romney: esophageal stenosis s/p dilation and biopsy, medium sized hiatal hernia, normal duodenum  . ESOPHAGOGASTRODUODENOSCOPY (EGD) WITH PROPOFOL N/A 07/30/2017   Dr. Gala Romney: esophageal stenosis s/p dilation and biopsy, medium-sized hiatal hernia, normal duodenum  . HEMORRHOID SURGERY  01-21-2008    [facility]  . IR RADIOLOGIST EVAL & MGMT  08/29/2020  . MALONEY DILATION N/A 02/02/2017   Procedure: Venia Minks DILATION;  Surgeon: Daneil Dolin, MD;  [facility];  Service: Endoscopy;  Laterality: N/A;  . Venia Minks DILATION N/A 07/30/2017   Procedure: Venia Minks DILATION;   Surgeon: Daneil Dolin, MD;  [facility];  Service: Endoscopy;  Laterality: N/A;  . POLYPECTOMY  05/03/2018   Procedure: POLYPECTOMY;  Surgeon: Daneil Dolin, MD;  [facility];  Service: Endoscopy;;  ascending colon polyp, sigmoid colon polyps (multiple)  . RADIOACTIVE SEED IMPLANT N/A 04/23/2017   Procedure: RADIOACTIVE SEED IMPLANT/BRACHYTHERAPY IMPLANT;  Surgeon: Franchot Gallo, MD;  Location: [facility];  Service: Urology;  Laterality: N/A;  . SHOULDER ARTHROSCOPY  08/05/2003  . SPACE OAR INSTILLATION N/A 04/23/2017   Procedure: SPACE OAR INSTILLATION;  Surgeon: Franchot Gallo, MD;  Location: [facility];  Service: Urology;  Laterality: N/A;  . TRANSTHORACIC ECHOCARDIOGRAM  03/28/2009   mild LVH, ef 55-60%/ mild aorta calcification  . VASECTOMY  08/05/1983  . VIDEO ASSISTED THORACOSCOPY (VATS)/DECORTICATION Left     Prior to Admission medications   Medication Sig Start Date End Date Taking? Authorizing Provider  acetaminophen (TYLENOL) 500 MG tablet Take 2 tablets (1,000 mg total) by mouth every 6 (six) hours as needed for mild pain. Patient taking differently: Take 1,000 mg by mouth every 6 (six) hours as needed for moderate pain. 08/21/20  Yes Meuth, Brooke A, PA-C  acidophilus (RISAQUAD) CAPS capsule Take 1 capsule by mouth daily.   Yes [provider]  allopurinol (ZYLOPRIM) 100 MG tablet Take 100 mg by mouth daily. 09/17/20  Yes [provider]  doxycycline (VIBRAMYCIN) 50 MG capsule Take 50 mg by mouth daily. 08/26/20  Yes [provider]  Fluticasone-Umeclidin-Vilant (TRELEGY ELLIPTA) 100-62.5-25 MCG/INH AEPB Inhale 1 puff into the lungs daily. 11/01/20  Yes Icard, Bradley L, DO  ipratropium-albuterol (DUONEB) 0.5-2.5 (3) MG/3ML SOLN Take 3 mLs by nebulization every 6 (six) hours as needed (shortness of breath). 10/09/20  Yes [provider]  Multiple Vitamin (MULTIVITAMIN WITH MINERALS)  TABS tablet Take 1 tablet by mouth daily. 08/22/20  Yes Meuth, Brooke A, PA-C  ondansetron (ZOFRAN ODT) 4 MG disintegrating tablet Take 1 tablet (4 mg total) by mouth every 8 (eight) hours as needed for nausea or vomiting. 12/31/20  Yes [place], Greggory Brandy, MD  oxyCODONE-acetaminophen (PERCOCET/ROXICET) 5-325 MG tablet Take 1 tablet by mouth every 6 (six) hours as needed for severe pain. 12/31/20  Yes Luna Fuse, MD  predniSONE (STERAPRED UNI-PAK 21 TAB) 5 MG (21) TBPK tablet Take by mouth. Back flare 01/07/21  Yes [provider]  psyllium (METAMUCIL SMOOTH TEXTURE) 28 % packet Take 1 packet by mouth daily.   Yes [provider]  rivaroxaban (XARELTO) 20 MG TABS tablet Take 1 tablet (20 mg total) by mouth daily with supper. 10/26/20  Yes [place], Rebekah M, PA-C  tiZANidine (ZANAFLEX) 2 MG tablet Take 1 tablet (2 mg total) by mouth every 6 (six) hours as needed for up to 10 days for muscle spasms. 12/31/20 01/10/21 Yes  Luna Fuse, MD  albuterol (PROVENTIL) (2.5 MG/3ML) 0.083% nebulizer solution Take 3 mLs (2.5 mg total) by nebulization every 6 (six) hours as needed for wheezing or shortness of breath. Patient not taking: Reported on 03/14/2020 03/22/18 05/23/20  June Leap L, DO    Allergies as of 01/08/2021  . (No Known Allergies)    Family History  Problem Relation Age of Onset  . Diabetes Mother   . Heart disease Father   . Heart disease Brother   . Cancer Neg Hx   . Colon cancer Neg Hx   . Colon polyps Neg Hx   . Neuropathy Neg Hx     Social History   Socioeconomic History  . Marital status: Married    Spouse name: Not on file  . Number of children: 2  . Years of education: Not on file  . Highest education level: Bachelor's degree (e.g., BA, AB, BS)  Occupational History  . Not on file  Tobacco Use  . Smoking status: Former Smoker    Packs/day: 1.00    Years: 30.00    Pack years: 30.00    Types: Cigarettes, E-cigarettes    Quit date: 10/03/2015    Years  since quitting: 5.2  . Smokeless tobacco: Never Used  . Tobacco comment: stoped vaping 2018  Vaping Use  . Vaping Use: Former  Substance and Sexual Activity  . Alcohol use: Yes    Alcohol/week: 5.0 standard drinks    Types: 5 Glasses of wine per week  . Drug use: No  . Sexual activity: Yes    Birth control/protection: None  Other Topics Concern  . Not on file  Social History Narrative   Lives at home with his wife   Right handed   Social Determinants of Health   Financial Resource Strain: Not on file  Food Insecurity: Not on file  Transportation Needs: Not on file  Physical Activity: Not on file  Stress: Not on file  Social Connections: Not on file  Intimate Partner Violence: Not on file    Review of Systems: See HPI, otherwise negative ROS  Physical Exam: BP 131/81   Pulse 100   Temp 97.7 F (36.5 C)   Ht 6\' 1"  (1.854 m)   Wt 219 lb (99.3 kg)   BMI 28.89 kg/m  General:   Alert,   pleasant and cooperative in NAD Mouth:  No deformity or lesions. Neck:  Supple; no masses or thyromegaly. No significant cervical adenopathy. Lungs:  Clear throughout to auscultation.   No wheezes, crackles, or rhonchi. No acute distress. Heart:  Regular rate and rhythm; no murmurs, clicks, rubs,  or gallops. Abdomen: Non-distended, normal bowel sounds.  Soft and nontender without appreciable mass or hepatosplenomegaly.  Pulses:  Normal pulses noted. Extremities:  Without clubbing or edema.  Impression/Plan: 65 year old gentleman history multiple colonic adenomas removed previously; recently with recurrent complicated diverticulitis.  Clinically, his diverticulitis has resolved.  He is in need of a surveillance colonoscopy.  He is chronically anticoagulated due to recurrent DVT/PE/factor 5 Leiden deficiency.  He will need to be off anticoagulation for his upcoming colonoscopy.  We will need to check with Dr. Delton Coombes regarding simply holding Xarelto for 2 days or need for bridging  therapy.  History of GERD-symptoms well controlled.  No dysphagia.  Recommendations:  I have offered the patient a surveillance colonoscopy given history of colonic polyps.  ASA 3/propofol.  We will plan to hold Xarelto for 2 days at a minimum.  We will  reach out to Dr. Delton Coombes to determine whether any bridging maneuvers are needed. The risks, benefits, limitations, alternatives and imponderables have been reviewed with the patient. Questions have been answered. All parties are agreeable.    Notice: This dictation was prepared with Dragon dictation along with smaller phrase technology. Any transcriptional errors that result from this process are unintentional and may not be corrected upon review.

## 2021-01-10 NOTE — Telephone Encounter (Signed)
Noted. Telephone note placed with encounter form. Will call pt to schedule procedure when Dr. Roseanne Kaufman future schedule is available.

## 2021-01-10 NOTE — Telephone Encounter (Signed)
Routing to Dr.Rourk and [facility] clinical pool. Per Dr.K, ok to hold Xarelto.

## 2021-01-15 ENCOUNTER — Ambulatory Visit: Payer: 59 | Admitting: Urology

## 2021-01-18 ENCOUNTER — Telehealth: Payer: Self-pay | Admitting: *Deleted

## 2021-01-18 ENCOUNTER — Encounter: Payer: Self-pay | Admitting: *Deleted

## 2021-01-18 MED ORDER — PEG 3350-KCL-NA BICARB-NACL 420 G PO SOLR: ORAL | 0 refills | Status: DC

## 2021-01-18 NOTE — Telephone Encounter (Signed)
Called pt. He has been scheduled for TCS with propofol, ASA 3 with Dr. Gala Romney on 7/15 at 2:30pm. Aware will mail prep instructions with pre-op appt. Advised will send rx for prep to pharmacy.

## 2021-01-22 ENCOUNTER — Other Ambulatory Visit: Payer: 59

## 2021-01-23 ENCOUNTER — Inpatient Hospital Stay (HOSPITAL_COMMUNITY): Payer: 59 | Attending: Hematology

## 2021-01-23 ENCOUNTER — Other Ambulatory Visit: Payer: Self-pay

## 2021-01-23 ENCOUNTER — Ambulatory Visit (HOSPITAL_COMMUNITY)
Admission: RE | Admit: 2021-01-23 | Discharge: 2021-01-23 | Disposition: A | Discharge status: Home / Self Care | Payer: 59 | Source: Ambulatory Visit | Attending: Hematology | Admitting: Hematology

## 2021-01-23 DIAGNOSIS — C61 Malignant neoplasm of prostate: Secondary | ICD-10-CM

## 2021-01-23 DIAGNOSIS — Z86718 Personal history of other venous thrombosis and embolism: Secondary | ICD-10-CM | POA: Diagnosis not present

## 2021-01-23 DIAGNOSIS — I82431 Acute embolism and thrombosis of right popliteal vein: Secondary | ICD-10-CM

## 2021-01-23 LAB — D-DIMER, QUANTITATIVE: D-Dimer, Quant: 0.47 ug/mL-FEU (ref 0.00–0.50)

## 2021-01-24 ENCOUNTER — Other Ambulatory Visit: Payer: 59

## 2021-01-25 LAB — PSA: Prostate Specific Ag, Serum: <0.1 ng/mL (ref 0.0–4.0)

## 2021-01-29 ENCOUNTER — Other Ambulatory Visit: Payer: Self-pay

## 2021-01-29 ENCOUNTER — Telehealth (INDEPENDENT_AMBULATORY_CARE_PROVIDER_SITE_OTHER): Payer: 59 | Admitting: Urology

## 2021-01-29 DIAGNOSIS — N486 Induration penis plastica: Secondary | ICD-10-CM | POA: Diagnosis not present

## 2021-01-29 DIAGNOSIS — Z8546 Personal history of malignant neoplasm of prostate: Secondary | ICD-10-CM

## 2021-01-29 DIAGNOSIS — R3915 Urgency of urination: Secondary | ICD-10-CM

## 2021-01-29 DIAGNOSIS — N521 Erectile dysfunction due to diseases classified elsewhere: Secondary | ICD-10-CM | POA: Diagnosis not present

## 2021-01-29 NOTE — Progress Notes (Signed)
History of Present Illness: Telehealth visit today to follow-up history of prostate cancer.  He underwent TRUS/Bx on 5.1.2018. He had a 4 mm left apical nodule, a PSA of 5.99. Prostatic volume was 17 g. PSA density 0.35.  6/12 cores were positive for adenocarcinoma, all on the left side of the prostate.  3 cores revealed GG 1 pattern 3 cores revealed GG 2 pattern IPSS pre-procedure: 2/0   He underwent I-125 brachytherapy on 9.20.2018.  6.15.2021: PSA undetectable.  Mild Peyronie's, downward curvature.  Also with ED and urinary urgency.  6.28.2022: PSA undetectable. He has been treated for diverticulitis x 2 since January.  He has a good urinary stream.  He does have continued urgency.  No gross hematuria, no blood in his stool.  Currently, he is not eager to have a prescription for Viagra, but will talk with his wife about that.  Past Medical History:  Diagnosis Date   Bilateral shoulder bursitis    Chronic back pain    COPD (chronic obstructive pulmonary disease) with emphysema ([name])    DDD (degenerative disc disease), lumbar    hx epideral injection  L5--S1    Diverticulitis    DVT (deep venous thrombosis) ([place]) 11/19/2018   Dysphasia    intermittant w/ food    Dyspnea    Dyspnea on exertion    GERD (gastroesophageal reflux disease)    Gout    L great toe   Heterozygous factor V Leiden mutation ([place])    Hiatal hernia    Hip problem 2020   left, seeing orthopedics   History of acute pancreatitis    alcoholic pancreatitis [id number] and 01-30-2012   History of colon polyps    10-17-2005  hyperplastic polyp's   History of esophageal stricture    s/p  dilation 02-02-2017   History of peptic ulcer 1990s   History of pneumococcal pneumonia 2006   bilateral pneumonia w/ left lower lobe abscess treated w/ chest tube with suction for drainage   History of pneumothorax    1984--  left spontaneous pneumothorax ,  treated w/ chest tube   Hypertension    Malignant neoplasm of  prostate [facility]) urologist-  dr Falyn Rubel/  oncologist -- dr Tammi Klippel   dx 12-02-2016-- Stage T2b, Gleason 3+4,  PSA 5.99,  vol 25cc   Numbness of left foot    outer left ankle numb-- per pt has appt. w/ neurologist   OSA (obstructive sleep apnea)    per pt has not used cpap over year ago from 04-16-2017--- per study 01-17-2010  severe osa   Solid nodule of lung greater than 8 mm in diameter 05/11/2018   03/2018: RUL Nodule, 46mm   Wears glasses     Past Surgical History:  Procedure Laterality Date   BIOPSY  02/02/2017   Procedure: BIOPSY;  Surgeon: Daneil Dolin, MD;  [facility];  Service: Endoscopy;;  duodenal bx's, esophgeal bx's   BIOPSY  07/30/2017   Procedure: BIOPSY;  Surgeon: Daneil Dolin, MD;  [facility];  Service: Endoscopy;;  esophagus   CARDIOVASCULAR STRESS TEST  09/12/2009   normal nuclear perfusion study w/ no ischemia /  normal LV function and wall motion , ef 57%   COLONOSCOPY  2007   Dr. Gala Romney: hyperplastic polyps, internal hemorrhoids    COLONOSCOPY WITH PROPOFOL N/A 05/03/2018   Dr. Gala Romney: diverticulosis in entire colon, multiple polyps in sigmoid, at splenic flexure, and ascending colon, not all polyps removed. Tubular adenomas and inflammatory polyps.  Needs 3 year surveilance   EPIDURAL BLOCK INJECTION  2020   ESOPHAGOGASTRODUODENOSCOPY (EGD) WITH PROPOFOL N/A 02/02/2017   Dr. Gala Romney: esophageal stenosis s/p dilation and biopsy, medium sized hiatal hernia, normal duodenum   ESOPHAGOGASTRODUODENOSCOPY (EGD) WITH PROPOFOL N/A 07/30/2017   Dr. Gala Romney: esophageal stenosis s/p dilation and biopsy, medium-sized hiatal hernia, normal duodenum   HEMORRHOID SURGERY  01-21-2008    [facility]   IR RADIOLOGIST EVAL & MGMT  08/29/2020   MALONEY DILATION N/A 02/02/2017   Procedure: Venia Minks DILATION;  Surgeon: Daneil Dolin, MD;  [facility];  Service: Endoscopy;  Laterality: N/A;   MALONEY DILATION N/A 07/30/2017   Procedure: Venia Minks DILATION;   Surgeon: Daneil Dolin, MD;  [facility];  Service: Endoscopy;  Laterality: N/A;   POLYPECTOMY  05/03/2018   Procedure: POLYPECTOMY;  Surgeon: Daneil Dolin, MD;  [facility];  Service: Endoscopy;;  ascending colon polyp, sigmoid colon polyps (multiple)   RADIOACTIVE SEED IMPLANT N/A 04/23/2017   Procedure: RADIOACTIVE SEED IMPLANT/BRACHYTHERAPY IMPLANT;  Surgeon: Franchot Gallo, MD;  Location: [facility];  Service: Urology;  Laterality: N/A;   SHOULDER ARTHROSCOPY  08/05/2003   SPACE OAR INSTILLATION N/A 04/23/2017   Procedure: SPACE OAR INSTILLATION;  Surgeon: Franchot Gallo, MD;  Location: [facility];  Service: Urology;  Laterality: N/A;   TRANSTHORACIC ECHOCARDIOGRAM  03/28/2009   mild LVH, ef 55-60%/ mild aorta calcification   VASECTOMY  08/05/1983   VIDEO ASSISTED THORACOSCOPY (VATS)/DECORTICATION Left     Home Medications:  Allergies as of 01/29/2021   No Known Allergies      Medication List        Accurate as of January 29, 2021 12:10 PM. If you have any questions, ask your nurse or doctor.          acetaminophen 500 MG tablet Commonly known as: TYLENOL Take 2 tablets (1,000 mg total) by mouth every 6 (six) hours as needed for mild pain. What changed: reasons to take this   acidophilus Caps capsule Take 1 capsule by mouth daily.   allopurinol 100 MG tablet Commonly known as: ZYLOPRIM Take 100 mg by mouth daily.   doxycycline 50 MG capsule Commonly known as: VIBRAMYCIN Take 50 mg by mouth daily.   ipratropium-albuterol 0.5-2.5 (3) MG/3ML Soln Commonly known as: DUONEB Take 3 mLs by nebulization every 6 (six) hours as needed (shortness of breath).   multivitamin with minerals Tabs tablet Take 1 tablet by mouth daily.   ondansetron 4 MG disintegrating tablet Commonly known as: Zofran ODT Take 1 tablet (4 mg total) by mouth every 8 (eight) hours as needed for nausea or vomiting.    oxyCODONE-acetaminophen 5-325 MG tablet Commonly known as: PERCOCET/ROXICET Take 1 tablet by mouth every 6 (six) hours as needed for severe pain.   polyethylene glycol-electrolytes 420 g solution Commonly known as: NuLYTELY As directed   predniSONE 5 MG (21) Tbpk tablet Commonly known as: STERAPRED UNI-PAK 21 TAB Take by mouth. Back flare   psyllium 28 % packet Commonly known as: METAMUCIL SMOOTH TEXTURE Take 1 packet by mouth daily.   rivaroxaban 20 MG Tabs tablet Commonly known as: XARELTO Take 1 tablet (20 mg total) by mouth daily with supper.   Trelegy Ellipta 100-62.5-25 MCG/INH Aepb Generic drug: Fluticasone-Umeclidin-Vilant Inhale 1 puff into the lungs daily.        Allergies: No Known Allergies  Family History  Problem Relation Age of Onset   Diabetes Mother  Heart disease Father    Heart disease Brother    Cancer Neg Hx    Colon cancer Neg Hx    Colon polyps Neg Hx    Neuropathy Neg Hx     Social History:  reports that he quit smoking about 5 years ago. His smoking use included cigarettes and e-cigarettes. He has a 30.00 pack-year smoking history. He has never used smokeless tobacco. He reports current alcohol use of about 5.0 standard drinks of alcohol per week. He reports that he does not use drugs.  ROS: A complete review of systems was performed.  All systems are negative except for pertinent findings as noted.  I have reviewed prior pt notes  I have reviewed prior PSA results  Impression/Assessment:  1.  History of prostate cancer.  Favorable intermediate risk, status post brachytherapy and September, 2018.  Excellent PSA result.  No significant radiation related toxicity thus far  2.  Lower urinary tract symptoms, urgency mainly.  He also has terminal dribbling.  3.  ED/Peyronie's disease, not currently sexually active  Plan:  1.  I did counsel him on perineal pressure after voiding  2.  I did offer sildenafil-he will call if he wants to  start a prescription  3.  I will see back in 1 year following PSA   I connected with  Sherran Needs on 01/29/21 by a video enabled telemedicine application and verified that I am speaking with the correct person using two identifiers.   I discussed the limitations of evaluation and management by telemedicine. The patient expressed understanding and agreed to proceed.  Pt seen in context of Covid 19 pandemic  Pt location:Home My location: Home office Persons on call:pt, myself

## 2021-01-31 ENCOUNTER — Ambulatory Visit (HOSPITAL_COMMUNITY): Payer: 59 | Admitting: Hematology and Oncology

## 2021-02-01 ENCOUNTER — Other Ambulatory Visit: Payer: Self-pay

## 2021-02-01 ENCOUNTER — Inpatient Hospital Stay (HOSPITAL_COMMUNITY): Payer: 59 | Attending: Hematology and Oncology | Admitting: Physician Assistant

## 2021-02-01 VITALS — BP 137/88 | HR 89 | Temp 98.8°F | Resp 16 | Wt 226.8 lb

## 2021-02-01 DIAGNOSIS — C61 Malignant neoplasm of prostate: Secondary | ICD-10-CM | POA: Insufficient documentation

## 2021-02-01 DIAGNOSIS — I82431 Acute embolism and thrombosis of right popliteal vein: Secondary | ICD-10-CM

## 2021-02-01 DIAGNOSIS — Z7982 Long term (current) use of aspirin: Secondary | ICD-10-CM | POA: Insufficient documentation

## 2021-02-01 DIAGNOSIS — M10062 Idiopathic gout, left knee: Secondary | ICD-10-CM | POA: Insufficient documentation

## 2021-02-01 DIAGNOSIS — Z86718 Personal history of other venous thrombosis and embolism: Secondary | ICD-10-CM | POA: Diagnosis not present

## 2021-02-01 DIAGNOSIS — Z87891 Personal history of nicotine dependence: Secondary | ICD-10-CM | POA: Insufficient documentation

## 2021-02-01 NOTE — Patient Instructions (Signed)
[place] at [facility] Discharge Instructions  You were seen today by Tarri Abernethy PA-C for your history of blood clots.  The ultrasound of your leg did not show any residual blood clot, so you can stop Xarelto at this time.  You do remain at a slightly higher risk of blood clots in the future, so I recommend that you start taking 81 mg of aspirin each day.  LABS: Return in 1 month for labs  MEDICATIONS: Aspirin 81 mg daily  FOLLOW-UP APPOINTMENT: Phone visit in 6 weeks   Thank you for choosing [place] at [facility] to provide your oncology and hematology care.  To afford each patient quality time with our provider, please arrive at least 15 minutes before your scheduled appointment time.   If you have a lab appointment with the [facility] please come in thru the Main Entrance and check in at the main information desk.  You need to re-schedule your appointment should you arrive 10 or more minutes late.  We strive to give you quality time with our providers, and arriving late affects you and other patients whose appointments are after yours.  Also, if you no show three or more times for appointments you may be dismissed from the clinic at the providers discretion.     Again, thank you for choosing [facility].  Our hope is that these requests will decrease the amount of time that you wait before being seen by our physicians.       _____________________________________________________________  Should you have questions after your visit to [facility], please contact our office at [phone] and follow the prompts.  Our office hours are 8:00 a.m. and 4:30 p.m. Monday - Friday.  Please note that voicemails left after 4:00 p.m. may not be returned until the following business day.  We are closed weekends and major holidays.  You do have access to a nurse 24-7, just call the main number to the clinic  [phone] and do not press any options, hold on the line and a nurse will answer the phone.    For prescription refill requests, have your pharmacy contact our office and allow 72 hours.    Due to Covid, you will need to wear a mask upon entering the hospital. If you do not have a mask, a mask will be given to you at the Main Entrance upon arrival. For doctor visits, patients may have 1 support person age 83 or older with them. For treatment visits, patients can not have anyone with them due to social distancing guidelines and our immunocompromised population.

## 2021-02-01 NOTE — Progress Notes (Signed)
[name] [place], [place] [address]   CLINIC:  Medical Oncology/Hematology  PCP:  [name], MD [place] [id number] [phone]   REASON FOR VISIT:  Follow-up for history of DVT.  CURRENT THERAPY: Xarelto  INTERVAL HISTORY:  [name] 65 y.o. male returns for routine follow-up of his history of provoked right lower extremity DVT.  He was last seen by Dr. Delton Coombes and Tarri Abernethy on 10/02/2020.  Patient had initially been placed on Eliquis, but this was changed to Xarelto due to insurance reasons.  He reports that he has been tolerating Xarelto well without any signs or symptoms of bleeding such as gross rectal hemorrhage, epistaxis, or melena.  He denies any leg swelling, pain, and erythema.  No shortness of breath, dyspnea on exertion, chest pain, cough, hemoptysis, or palpitations.  His only complaint today is left knee pain secondary to acute gouty arthritis.  Otherwise, he is doing well.  He has 80% energy and 100% appetite. He endorses that he is maintaining a stable weight.    REVIEW OF SYSTEMS:  Review of Systems  Constitutional:  Negative for appetite change, chills, diaphoresis, fatigue, fever and unexpected weight change.  HENT:   Negative for lump/mass and nosebleeds.   Eyes:  Negative for eye problems.  Respiratory:  Negative for cough, hemoptysis and shortness of breath.   Cardiovascular:  Negative for chest pain, leg swelling and palpitations.  Gastrointestinal:  Negative for abdominal pain, blood in stool, constipation, diarrhea, nausea and vomiting.  Genitourinary:  Negative for hematuria.   Musculoskeletal:  Positive for arthralgias (Left knee gout).  Skin: Negative.   Neurological:  Negative for dizziness, headaches and light-headedness.  Hematological:  Does not bruise/bleed easily.     PAST MEDICAL/SURGICAL HISTORY:  Past Medical History:  Diagnosis Date   Bilateral shoulder bursitis     Chronic back pain    COPD (chronic obstructive pulmonary disease) with emphysema ([name])    DDD (degenerative disc disease), lumbar    hx epideral injection  L5--S1    Diverticulitis    DVT (deep venous thrombosis) ([place]) 11/19/2018   Dysphasia    intermittant w/ food    Dyspnea    Dyspnea on exertion    GERD (gastroesophageal reflux disease)    Gout    L great toe   Heterozygous factor V Leiden mutation ([place])    Hiatal hernia    Hip problem 2020   left, seeing orthopedics   History of acute pancreatitis    alcoholic pancreatitis [id number] and 01-30-2012   History of colon polyps    10-17-2005  hyperplastic polyp's   History of esophageal stricture    s/p  dilation 02-02-2017   History of peptic ulcer 1990s   History of pneumococcal pneumonia 2006   bilateral pneumonia w/ left lower lobe abscess treated w/ chest tube with suction for drainage   History of pneumothorax    1984--  left spontaneous pneumothorax ,  treated w/ chest tube   Hypertension    Malignant neoplasm of prostate [facility]) urologist-  dr dahlstedt/  oncologist -- dr Tammi Klippel   dx 12-02-2016-- Stage T2b, Gleason 3+4,  PSA 5.99,  vol 25cc   Numbness of left foot    outer left ankle numb-- per pt has appt. w/ neurologist   OSA (obstructive sleep apnea)    per pt has not used cpap over year ago from 04-16-2017--- per study 01-17-2010  severe osa   Solid nodule  of lung greater than 8 mm in diameter 05/11/2018   03/2018: RUL Nodule, 90mm   Wears glasses    Past Surgical History:  Procedure Laterality Date   BIOPSY  02/02/2017   Procedure: BIOPSY;  Surgeon: Daneil Dolin, MD;  [facility];  Service: Endoscopy;;  duodenal bx's, esophgeal bx's   BIOPSY  07/30/2017   Procedure: BIOPSY;  Surgeon: Daneil Dolin, MD;  [facility];  Service: Endoscopy;;  esophagus   CARDIOVASCULAR STRESS TEST  09/12/2009   normal nuclear perfusion study w/ no ischemia /  normal LV function and wall motion , ef  57%   COLONOSCOPY  2007   Dr. Gala Romney: hyperplastic polyps, internal hemorrhoids    COLONOSCOPY WITH PROPOFOL N/A 05/03/2018   Dr. Gala Romney: diverticulosis in entire colon, multiple polyps in sigmoid, at splenic flexure, and ascending colon, not all polyps removed. Tubular adenomas and inflammatory polyps. Needs 3 year surveilance   EPIDURAL BLOCK INJECTION  2020   ESOPHAGOGASTRODUODENOSCOPY (EGD) WITH PROPOFOL N/A 02/02/2017   Dr. Gala Romney: esophageal stenosis s/p dilation and biopsy, medium sized hiatal hernia, normal duodenum   ESOPHAGOGASTRODUODENOSCOPY (EGD) WITH PROPOFOL N/A 07/30/2017   Dr. Gala Romney: esophageal stenosis s/p dilation and biopsy, medium-sized hiatal hernia, normal duodenum   HEMORRHOID SURGERY  01-21-2008    [facility]   IR RADIOLOGIST EVAL & MGMT  08/29/2020   MALONEY DILATION N/A 02/02/2017   Procedure: Venia Minks DILATION;  Surgeon: Daneil Dolin, MD;  [facility];  Service: Endoscopy;  Laterality: N/A;   MALONEY DILATION N/A 07/30/2017   Procedure: Venia Minks DILATION;  Surgeon: Daneil Dolin, MD;  [facility];  Service: Endoscopy;  Laterality: N/A;   POLYPECTOMY  05/03/2018   Procedure: POLYPECTOMY;  Surgeon: Daneil Dolin, MD;  [facility];  Service: Endoscopy;;  ascending colon polyp, sigmoid colon polyps (multiple)   RADIOACTIVE SEED IMPLANT N/A 04/23/2017   Procedure: RADIOACTIVE SEED IMPLANT/BRACHYTHERAPY IMPLANT;  Surgeon: Franchot Gallo, MD;  Location: [facility];  Service: Urology;  Laterality: N/A;   SHOULDER ARTHROSCOPY  08/05/2003   SPACE OAR INSTILLATION N/A 04/23/2017   Procedure: SPACE OAR INSTILLATION;  Surgeon: Franchot Gallo, MD;  Location: [facility];  Service: Urology;  Laterality: N/A;   TRANSTHORACIC ECHOCARDIOGRAM  03/28/2009   mild LVH, ef 55-60%/ mild aorta calcification   VASECTOMY  08/05/1983   VIDEO ASSISTED THORACOSCOPY (VATS)/DECORTICATION Left      SOCIAL HISTORY:  Social  History   Socioeconomic History   Marital status: Married    Spouse name: Not on file   Number of children: 2   Years of education: Not on file   Highest education level: Bachelor's degree (e.g., BA, AB, BS)  Occupational History   Not on file  Tobacco Use   Smoking status: Former    Packs/day: 1.00    Years: 30.00    Pack years: 30.00    Types: Cigarettes, E-cigarettes    Quit date: 10/03/2015    Years since quitting: 5.3   Smokeless tobacco: Never   Tobacco comments:    stoped vaping 2018  Vaping Use   Vaping Use: Former  Substance and Sexual Activity   Alcohol use: Yes    Alcohol/week: 5.0 standard drinks    Types: 5 Glasses of wine per week   Drug use: No   Sexual activity: Yes    Birth control/protection: None  Other Topics Concern   Not on file  Social History Narrative  Lives at home with his wife   Right handed   Social Determinants of Health   Financial Resource Strain: Not on file  Food Insecurity: Not on file  Transportation Needs: Not on file  Physical Activity: Not on file  Stress: Not on file  Social Connections: Not on file  Intimate Partner Violence: Not on file    FAMILY HISTORY:  Family History  Problem Relation Age of Onset   Diabetes Mother    Heart disease Father    Heart disease Brother    Cancer Neg Hx    Colon cancer Neg Hx    Colon polyps Neg Hx    Neuropathy Neg Hx     CURRENT MEDICATIONS:  Outpatient Encounter Medications as of 02/01/2021  Medication Sig   allopurinol (ZYLOPRIM) 100 MG tablet Take 100 mg by mouth daily.   doxycycline (VIBRAMYCIN) 50 MG capsule Take 50 mg by mouth daily.   Fluticasone-Umeclidin-Vilant (TRELEGY ELLIPTA) 100-62.5-25 MCG/INH AEPB Inhale 1 puff into the lungs daily.   ipratropium-albuterol (DUONEB) 0.5-2.5 (3) MG/3ML SOLN Take 3 mLs by nebulization every 6 (six) hours as needed (shortness of breath).   Multiple Vitamin (MULTIVITAMIN WITH MINERALS) TABS tablet Take 1 tablet by mouth daily.    psyllium (METAMUCIL SMOOTH TEXTURE) 28 % packet Take 1 packet by mouth daily.   acetaminophen (TYLENOL) 500 MG tablet Take 2 tablets (1,000 mg total) by mouth every 6 (six) hours as needed for mild pain. (Patient not taking: Reported on 02/01/2021)   [DISCONTINUED] acidophilus (RISAQUAD) CAPS capsule Take 1 capsule by mouth daily.   [DISCONTINUED] albuterol (PROVENTIL) (2.5 MG/3ML) 0.083% nebulizer solution Take 3 mLs (2.5 mg total) by nebulization every 6 (six) hours as needed for wheezing or shortness of breath. (Patient not taking: Reported on 03/14/2020)   [DISCONTINUED] ondansetron (ZOFRAN ODT) 4 MG disintegrating tablet Take 1 tablet (4 mg total) by mouth every 8 (eight) hours as needed for nausea or vomiting.   [DISCONTINUED] oxyCODONE-acetaminophen (PERCOCET/ROXICET) 5-325 MG tablet Take 1 tablet by mouth every 6 (six) hours as needed for severe pain.   [DISCONTINUED] polyethylene glycol-electrolytes (NULYTELY) 420 g solution As directed   [DISCONTINUED] predniSONE (STERAPRED UNI-PAK 21 TAB) 5 MG (21) TBPK tablet Take by mouth. Back flare   [DISCONTINUED] rivaroxaban (XARELTO) 20 MG TABS tablet Take 1 tablet (20 mg total) by mouth daily with supper.   No facility-administered encounter medications on file as of 02/01/2021.    ALLERGIES:  No Known Allergies   PHYSICAL EXAM:  ECOG PERFORMANCE STATUS: 0 - Asymptomatic  Vitals:   02/01/21 1312  BP: 137/88  Pulse: 89  Resp: 16  Temp: 98.8 F (37.1 C)  SpO2: 97%   Filed Weights   02/01/21 1312  Weight: 226 lb 12.8 oz (102.9 kg)   Physical Exam Constitutional:      Appearance: Normal appearance. He is obese.  HENT:     Head: Normocephalic and atraumatic.     Mouth/Throat:     Mouth: Mucous membranes are moist.  Eyes:     Extraocular Movements: Extraocular movements intact.     Pupils: Pupils are equal, round, and reactive to light.  Cardiovascular:     Rate and Rhythm: Normal rate and regular rhythm.     Pulses: Normal  pulses.     Heart sounds: Normal heart sounds.  Pulmonary:     Effort: Pulmonary effort is normal.     Breath sounds: Normal breath sounds.  Abdominal:     General: Bowel sounds are normal.  Palpations: Abdomen is soft.     Tenderness: There is no abdominal tenderness.  Musculoskeletal:        General: Swelling (Swelling, erythema, tenderness, and warmth of left knee) present.     Right lower leg: No edema.     Left lower leg: No edema.  Lymphadenopathy:     Cervical: No cervical adenopathy.  Skin:    General: Skin is warm and dry.     Comments: Hyperpigmentation of distal bilateral lower extremities  Neurological:     General: No focal deficit present.     Mental Status: He is alert and oriented to person, place, and time.  Psychiatric:        Mood and Affect: Mood normal.        Behavior: Behavior normal.     LABORATORY DATA:  I have reviewed the labs as listed.  CBC    Component Value Date/Time   WBC 8.6 11/14/2020 1050   RBC 4.37 11/14/2020 1050   HGB 13.5 11/14/2020 1050   HCT 42.1 11/14/2020 1050   PLT 242 11/14/2020 1050   MCV 96.3 11/14/2020 1050   MCH 30.9 11/14/2020 1050   MCHC 32.1 11/14/2020 1050   RDW 13.9 11/14/2020 1050   LYMPHSABS 2.7 08/27/2020 0920   MONOABS 0.7 08/27/2020 0920   EOSABS 0.4 08/27/2020 0920   BASOSABS 0.1 08/27/2020 0920   CMP Latest Ref Rng & Units 11/14/2020 08/21/2020 08/20/2020  Glucose 70 - 99 mg/dL 103(H) 98 160(H)  BUN 8 - 23 mg/dL 13 11 12   Creatinine 0.61 - 1.24 mg/dL 0.85 0.80 0.87  Sodium 135 - 145 mmol/L 138 138 137  Potassium 3.5 - 5.1 mmol/L 4.3 3.9 3.5  Chloride 98 - 111 mmol/L 101 103 101  CO2 22 - 32 mmol/L 27 23 26   Calcium 8.9 - 10.3 mg/dL 9.4 9.2 9.2  Total Protein 6.5 - 8.1 g/dL 7.5 - -  Total Bilirubin 0.3 - 1.2 mg/dL 0.7 - -  Alkaline Phos 38 - 126 U/L 58 - -  AST 15 - 41 U/L 20 - -  ALT 0 - 44 U/L 17 - -    DIAGNOSTIC IMAGING:  I have independently reviewed the relevant imaging and discussed with  the patient.  ASSESSMENT & PLAN: 1.  Recurrent DVT of right leg, weakly provoked: -Initial DVT in the right leg diagnosed October 2019, provoked by long flight -Treated with Xarelto from October 2019 through February 2020 -Repeat venous duplex on 05/31/2019 was negative for DVT -Recurrent right popliteal DVT diagnosed 08/17/2020, occurred during inpatient hospitalization - lower extremity Doppler on 08/17/2020 showed age-indeterminate DVT involving the right popliteal vein. -Venous duplex of bilateral lower extremity obtained 09/25/2020 confirmed presence of right popliteal DVT - Completed 35-month treatment with Xarelto from 10/02/2020 through 02/01/2021 - Repeat venous duplex (01/23/2021): Resolution of right lower extremity DVT without any residual thrombus - Patient is heterozygous for factor V Leiden, but no indication for lifelong anticoagulation as his prior DVTs have both been provoked - Repeat D-dimer (01/23/2021): Normal at 0.47 - PLAN: Discontinue Xarelto.  Will check anticardiolipin antibody, antibeta-2 glycoprotein antibodies, and lupus anticoagulant 30 days after Xarelto was discontinued.  Patient advised to start 81 mg aspirin daily.  Phone visit in 6 weeks to discuss results and next steps.   2.  Prostate cancer: -TRUS/biopsy on 12/02/2016.  4 mm left apical nodule, PSA 5.99.  6/12 cores positive for adenocarcinoma, all on the left side of the prostate.  3 cores revealed GS 3+3,  3 cores with GS 3+4.  He underwent I-125 brachytherapy on 04/23/2017. - Latest PSA on 01/24/2021 was < 0.1 - PLAN: Continue follow-up with Dr. Diona Fanti    PLAN SUMMARY & DISPOSITION: -Stop Xarelto.  Start aspirin. - Check labs in 1 month. - Phone visit in 6 weeks  All questions were answered. The patient knows to call the clinic with any problems, questions or concerns.  Medical decision making: Low  Time spent on visit: I spent 15 minutes counseling the patient face to face. The total time spent in the  appointment was 25 minutes and more than 50% was on counseling.   Harriett Rush, PA-C  02/01/21 3:26 PM

## 2021-02-05 ENCOUNTER — Telehealth: Payer: Self-pay | Admitting: Internal Medicine

## 2021-02-05 NOTE — Telephone Encounter (Signed)
Called pt, he recently seen cardiology and was taken off Xarelto. Started Aspirin 81mg  once a day. He asked if he needed to hold Aspirin prior to procedure. Advised pt he doesn't need to hold Aspirin prior to procedure.  FYI to Dr. Gala Romney.

## 2021-02-05 NOTE — Telephone Encounter (Signed)
noted 

## 2021-02-05 NOTE — Telephone Encounter (Signed)
Pt is scheduled a procedure on 7/15 with [facility] and has questions about holding a medication. Please call [phone]

## 2021-02-11 NOTE — Patient Instructions (Signed)
[name]  02/11/2021     @PREFPERIOPPHARMACY @   Your procedure is scheduled on 02/15/2021.   Report to [facility] at   1245  P.M.   Call this number if you have problems the morning of surgery:  [phone]   Remember:  Follow the diet and prep instructions given to you by the office.    Take these medicines the morning of surgery with A SIP OF WATER       allopurinol.  Use your nebulizer and your inhaler before you come and bring your rescue inhaler with you.     Do not wear jewelry, make-up or nail polish.  Do not wear lotions, powders, or perfumes, or deodorant.  Do not shave 48 hours prior to surgery.  Men may shave face and neck.  Do not bring valuables to the hospital.  [facility] is not responsible for any belongings or valuables.  Contacts, dentures or bridgework may not be worn into surgery.  Leave your suitcase in the car.  After surgery it may be brought to your room.  For patients admitted to the hospital, discharge time will be determined by your treatment team.  Patients discharged the day of surgery will not be allowed to drive home and must have someone with them for 24 hours.    Special instructions:    DO NOT smoke tobacco or vape for 24 hours before your procedure.  Please read over the following fact sheets that you were given. Anesthesia Post-op Instructions and Care and Recovery After Surgery      Colonoscopy, Adult, Care After This sheet gives you information about how to care for yourself after your procedure. Your health care provider may also give you more specific instructions. If you have problems or questions, contact your health careprovider. What can I expect after the procedure? After the procedure, it is common to have: A small amount of blood in your stool for 24 hours after the procedure. Some gas. Mild cramping or bloating of your abdomen. Follow these instructions at home: Eating and drinking  Drink enough  fluid to keep your urine pale yellow. Follow instructions from your health care provider about eating or drinking restrictions. Resume your normal diet as instructed by your health care provider. Avoid heavy or fried foods that are hard to digest.  Activity Rest as told by your health care provider. Avoid sitting for a long time without moving. Get up to take short walks every 1-2 hours. This is important to improve blood flow and breathing. Ask for help if you feel weak or unsteady. Return to your normal activities as told by your health care provider. Ask your health care provider what activities are safe for you. Managing cramping and bloating  Try walking around when you have cramps or feel bloated. Apply heat to your abdomen as told by your health care provider. Use the heat source that your health care provider recommends, such as a moist heat pack or a heating pad. Place a towel between your skin and the heat source. Leave the heat on for 20-30 minutes. Remove the heat if your skin turns bright red. This is especially important if you are unable to feel pain, heat, or cold. You may have a greater risk of getting burned.  General instructions If you were given a sedative during the procedure, it can affect you for several hours. Do not drive or operate machinery until your health care provider says  that it is safe. For the first 24 hours after the procedure: Do not sign important documents. Do not drink alcohol. Do your regular daily activities at a slower pace than normal. Eat soft foods that are easy to digest. Take over-the-counter and prescription medicines only as told by your health care provider. Keep all follow-up visits as told by your health care provider. This is important. Contact a health care provider if: You have blood in your stool 2-3 days after the procedure. Get help right away if you have: More than a small spotting of blood in your stool. Large blood clots in  your stool. Swelling of your abdomen. Nausea or vomiting. A fever. Increasing pain in your abdomen that is not relieved with medicine. Summary After the procedure, it is common to have a small amount of blood in your stool. You may also have mild cramping and bloating of your abdomen. If you were given a sedative during the procedure, it can affect you for several hours. Do not drive or operate machinery until your health care provider says that it is safe. Get help right away if you have a lot of blood in your stool, nausea or vomiting, a fever, or increased pain in your abdomen. This information is not intended to replace advice given to you by your health care provider. Make sure you discuss any questions you have with your healthcare provider. Document Revised: 07/15/2019 Document Reviewed: 02/14/2019 Elsevier Patient Education  [place] After This sheet gives you information about how to care for yourself after your procedure. Your health care provider may also give you more specific instructions. If you have problems or questions, contact your health careprovider. What can I expect after the procedure? After the procedure, it is common to have: Tiredness. Forgetfulness about what happened after the procedure. Impaired judgment for important decisions. Nausea or vomiting. Some difficulty with balance. Follow these instructions at home: For the time period you were told by your health care provider:     Rest as needed. Do not participate in activities where you could fall or become injured. Do not drive or use machinery. Do not drink alcohol. Do not take sleeping pills or medicines that cause drowsiness. Do not make important decisions or sign legal documents. Do not take care of children on your own. Eating and drinking Follow the diet that is recommended by your health care provider. Drink enough fluid to keep your urine pale  yellow. If you vomit: Drink water, juice, or soup when you can drink without vomiting. Make sure you have little or no nausea before eating solid foods. General instructions Have a responsible adult stay with you for the time you are told. It is important to have someone help care for you until you are awake and alert. Take over-the-counter and prescription medicines only as told by your health care provider. If you have sleep apnea, surgery and certain medicines can increase your risk for breathing problems. Follow instructions from your health care provider about wearing your sleep device: Anytime you are sleeping, including during daytime naps. While taking prescription pain medicines, sleeping medicines, or medicines that make you drowsy. Avoid smoking. Keep all follow-up visits as told by your health care provider. This is important. Contact a health care provider if: You keep feeling nauseous or you keep vomiting. You feel light-headed. You are still sleepy or having trouble with balance after 24 hours. You develop a rash. You have a fever. You  have redness or swelling around the IV site. Get help right away if: You have trouble breathing. You have new-onset confusion at home. Summary For several hours after your procedure, you may feel tired. You may also be forgetful and have poor judgment. Have a responsible adult stay with you for the time you are told. It is important to have someone help care for you until you are awake and alert. Rest as told. Do not drive or operate machinery. Do not drink alcohol or take sleeping pills. Get help right away if you have trouble breathing, or if you suddenly become confused. This information is not intended to replace advice given to you by your health care provider. Make sure you discuss any questions you have with your healthcare provider. Document Revised: 04/05/2020 Document Reviewed: 06/23/2019 Elsevier Patient Education  2022 [employer].

## 2021-02-12 ENCOUNTER — Other Ambulatory Visit: Payer: Self-pay

## 2021-02-12 ENCOUNTER — Encounter (HOSPITAL_COMMUNITY): Payer: Self-pay

## 2021-02-12 ENCOUNTER — Encounter (HOSPITAL_COMMUNITY)
Admission: RE | Admit: 2021-02-12 | Discharge: 2021-02-12 | Disposition: A | Discharge status: Home / Self Care | Payer: 59 | Source: Ambulatory Visit | Attending: Internal Medicine | Admitting: Internal Medicine

## 2021-02-15 ENCOUNTER — Ambulatory Visit (HOSPITAL_COMMUNITY): Payer: 59 | Admitting: Certified Registered Nurse Anesthetist

## 2021-02-15 ENCOUNTER — Encounter (HOSPITAL_COMMUNITY): Payer: Self-pay | Admitting: Internal Medicine

## 2021-02-15 ENCOUNTER — Ambulatory Visit (HOSPITAL_COMMUNITY)
Admission: RE | Admit: 2021-02-15 | Discharge: 2021-02-15 | Disposition: A | Discharge status: Home / Self Care | Payer: 59 | Attending: Internal Medicine | Admitting: Internal Medicine

## 2021-02-15 ENCOUNTER — Other Ambulatory Visit: Payer: Self-pay

## 2021-02-15 ENCOUNTER — Encounter (HOSPITAL_COMMUNITY)
Admission: RE | Disposition: A | Discharge status: Home / Self Care | Payer: Self-pay | Source: Home / Self Care | Attending: Internal Medicine

## 2021-02-15 DIAGNOSIS — Q438 Other specified congenital malformations of intestine: Secondary | ICD-10-CM | POA: Insufficient documentation

## 2021-02-15 DIAGNOSIS — Z86718 Personal history of other venous thrombosis and embolism: Secondary | ICD-10-CM | POA: Diagnosis not present

## 2021-02-15 DIAGNOSIS — Z86010 Personal history of colonic polyps: Secondary | ICD-10-CM | POA: Diagnosis not present

## 2021-02-15 DIAGNOSIS — Z09 Encounter for follow-up examination after completed treatment for conditions other than malignant neoplasm: Secondary | ICD-10-CM | POA: Diagnosis present

## 2021-02-15 DIAGNOSIS — Z7982 Long term (current) use of aspirin: Secondary | ICD-10-CM | POA: Diagnosis not present

## 2021-02-15 DIAGNOSIS — Z8546 Personal history of malignant neoplasm of prostate: Secondary | ICD-10-CM | POA: Diagnosis not present

## 2021-02-15 DIAGNOSIS — K621 Rectal polyp: Secondary | ICD-10-CM | POA: Diagnosis not present

## 2021-02-15 DIAGNOSIS — Z7951 Long term (current) use of inhaled steroids: Secondary | ICD-10-CM | POA: Diagnosis not present

## 2021-02-15 DIAGNOSIS — D128 Benign neoplasm of rectum: Secondary | ICD-10-CM | POA: Diagnosis not present

## 2021-02-15 DIAGNOSIS — Z87891 Personal history of nicotine dependence: Secondary | ICD-10-CM | POA: Insufficient documentation

## 2021-02-15 DIAGNOSIS — K635 Polyp of colon: Secondary | ICD-10-CM | POA: Insufficient documentation

## 2021-02-15 DIAGNOSIS — Z79899 Other long term (current) drug therapy: Secondary | ICD-10-CM | POA: Diagnosis not present

## 2021-02-15 DIAGNOSIS — K5730 Diverticulosis of large intestine without perforation or abscess without bleeding: Secondary | ICD-10-CM | POA: Insufficient documentation

## 2021-02-15 HISTORY — PX: BIOPSY: SHX5522

## 2021-02-15 HISTORY — PX: POLYPECTOMY: SHX5525

## 2021-02-15 HISTORY — PX: COLONOSCOPY WITH PROPOFOL: SHX5780

## 2021-02-15 SURGERY — COLONOSCOPY WITH PROPOFOL
Anesthesia: General

## 2021-02-15 MED ORDER — PROPOFOL 10 MG/ML IV BOLUS
INTRAVENOUS | Status: DC | PRN
  Administered 2021-02-15: 50 mg via INTRAVENOUS
  Administered 2021-02-15 (×3): 100 mg via INTRAVENOUS

## 2021-02-15 MED ORDER — LACTATED RINGERS IV SOLN: INTRAVENOUS | Status: DC | PRN

## 2021-02-15 MED ORDER — PROPOFOL 500 MG/50ML IV EMUL
INTRAVENOUS | Status: DC | PRN
  Administered 2021-02-15: 175 ug/kg/min via INTRAVENOUS

## 2021-02-15 NOTE — Op Note (Signed)
[facility] Patient Name: [name] Procedure Date: 02/15/2021 1:47 PM MRN: [id number] Date of Birth: [birth year] Attending MD: Norvel Richards , MD CSN: [id number] Age: 65 Admit Type: Inpatient Procedure:                Colonoscopy Indications:              High risk colon cancer surveillance: Personal                            history of colonic polyps Providers:                Norvel Richards, MD, Gwenlyn Fudge, RN,                            Kristine L. Risa Grill, Technician Referring MD:              Medicines:                Propofol per Anesthesia Complications:            No immediate complications. Estimated blood loss:                            Minimal Estimated Blood Loss:     Estimated blood loss was minimal. Procedure:                Pre-Anesthesia Assessment:                           - Prior to the procedure, a History and Physical                            was performed, and patient medications and                            allergies were reviewed. The patient's tolerance of                            previous anesthesia was also reviewed. The risks                            and benefits of the procedure and the sedation                            options and risks were discussed with the patient.                            All questions were answered, and informed consent                            was obtained. ASA Grade Assessment: III - A patient                            with severe systemic disease. After reviewing the  risks and benefits, the patient was deemed in                            satisfactory condition to undergo the procedure.                           After obtaining informed consent, the colonoscope                            was passed under direct vision. Throughout the                            procedure, the patient's blood pressure, pulse, and                            oxygen saturations  were monitored continuously. The                            CF-[id number] ([id number]) scope was introduced through                            the anus and advanced to the the cecum, identified                            by appendiceal orifice and ileocecal valve. The                            colonoscopy was performed without difficulty. The                            patient tolerated the procedure well. The quality                            of the bowel preparation was adequate. Scope In: 2:02:27 PM Scope Out: 2:31:39 PM Scope Withdrawal Time: 0 hours 14 minutes 8 seconds  Total Procedure Duration: 0 hours 29 minutes 12 seconds  Findings:      The perianal and digital rectal examinations were normal. Long and       redundant colon, requiring external abdominal pressure and changing of       the patient's position to reach the cecum.      Many large-mouthed diverticula were found in the entire colon,       particularly in the sigmoid descending component. Very densely populated       sigmoid/ descending diverticula with numerous hyperplastic/inflammatory       polyps present. Some areas of pigmented slightly edematous hemorrhagic       mucosal folds through the proximal sigmoid distal descending segment.       Please see photos. Clusters of hyperplastic appearing polyps. 1       particularly swollen hemorrhagic fold present. See photos. Clusters of       hyperplastic. Polyps removed with hot snare. Multiple. The remainder the       colonic mucosa appeared normal to 5 mm polyps in the rectum were removed       with cold snare. All polyps were not removed  Impression:               - Diverticulosis in the entire examined colon.                            Patient has significant diverticular disease.                            Chronic inflammatory changes in the proximal                            sigmoid and distal descending. Numerous                            hyperplastic and inflammatory  polyp some in                            clusters. 1 swollen fold which is likely benign.                            Biopsied separately. Multiple cold snare                            polypectomies performed. redundant and elongated                            colon. Polyps not removed. Moderate Sedation:      Moderate (conscious) sedation was personally administered by an       anesthesia professional. The following parameters were monitored: oxygen       saturation, heart rate, blood pressure, respiratory rate, EKG, adequacy       of pulmonary ventilation, and response to care. Recommendation:           - Patient has a contact number available for                            emergencies. The signs and symptoms of potential                            delayed complications were discussed with the                            patient. Return to normal activities tomorrow.                            Written discharge instructions were provided to the                            patient.                           - Advance diet as tolerated. Follow-up on                            pathology. Timing of repeat colonoscopy to be  determined. Further recommendations to follow. Procedure Code(s):        --- Professional ---                           [phone], Colonoscopy, flexible; diagnostic, including                            collection of specimen(s) by brushing or washing,                            when performed (separate procedure) Diagnosis Code(s):        --- Professional ---                           Z86.010, Personal history of colonic polyps                           K57.30, Diverticulosis of large intestine without                            perforation or abscess without bleeding CPT copyright 2019 [employer] Association. All rights reserved. The codes documented in this report are preliminary and upon coder review may  be revised to meet current  compliance requirements. [name]. Carly Sabo, MD Norvel Richards, MD 02/15/2021 2:44:52 PM This report has been signed electronically. Number of Addenda: 0

## 2021-02-15 NOTE — H&P (Signed)
@LOGO @   Primary Care Physician:  [name], MD Primary Gastroenterologist:  Dr. Gala Romney  Pre-Procedure History & Physical: HPI:  [name] is a 65 y.o. male here for surveillance colonoscopy.  History of multiple colonic adenomas removed 2019.  Here for surveillance examination.  He is now off Xarelto being treated for repeated DVTs.  Past Medical History:  Diagnosis Date   Bilateral shoulder bursitis    Chronic back pain    COPD (chronic obstructive pulmonary disease) with emphysema ([name])    DDD (degenerative disc disease), lumbar    hx epideral injection  L5--S1    Diverticulitis    DVT (deep venous thrombosis) ([place]) 11/19/2018   Dysphasia    intermittant w/ food    Dyspnea    Dyspnea on exertion    GERD (gastroesophageal reflux disease)    Gout    L great toe   Heterozygous factor V Leiden mutation ([place])    Hiatal hernia    Hip problem 2020   left, seeing orthopedics   History of acute pancreatitis    alcoholic pancreatitis [id number] and 01-30-2012   History of colon polyps    10-17-2005  hyperplastic polyp's   History of esophageal stricture    s/p  dilation 02-02-2017   History of peptic ulcer 1990s   History of pneumococcal pneumonia 2006   bilateral pneumonia w/ left lower lobe abscess treated w/ chest tube with suction for drainage   History of pneumothorax    1984--  left spontaneous pneumothorax ,  treated w/ chest tube   Malignant neoplasm of prostate [facility]) urologist-  dr dahlstedt/  oncologist -- dr Tammi Klippel   dx 12-02-2016-- Stage T2b, Gleason 3+4,  PSA 5.99,  vol 25cc   Numbness of left foot    outer left ankle numb-- per pt has appt. w/ neurologist   OSA (obstructive sleep apnea)    per pt has not used cpap over year ago from 04-16-2017--- per study 01-17-2010  severe osa   Solid nodule of lung greater than 8 mm in diameter 05/11/2018   03/2018: RUL Nodule, 90mm   Wears glasses     Past Surgical History:  Procedure Laterality Date    BIOPSY  02/02/2017   Procedure: BIOPSY;  Surgeon: Daneil Dolin, MD;  [facility];  Service: Endoscopy;;  duodenal bx's, esophgeal bx's   BIOPSY  07/30/2017   Procedure: BIOPSY;  Surgeon: Daneil Dolin, MD;  [facility]: [facility];  Service: Endoscopy;;  esophagus   CARDIOVASCULAR STRESS TEST  09/12/2009   normal nuclear perfusion study w/ no ischemia /  normal LV function and wall motion , ef 57%   COLONOSCOPY  2007   Dr. Gala Romney: hyperplastic polyps, internal hemorrhoids    COLONOSCOPY WITH PROPOFOL N/A 05/03/2018   Dr. Gala Romney: diverticulosis in entire colon, multiple polyps in sigmoid, at splenic flexure, and ascending colon, not all polyps removed. Tubular adenomas and inflammatory polyps. Needs 3 year surveilance   EPIDURAL BLOCK INJECTION  2020   ESOPHAGOGASTRODUODENOSCOPY (EGD) WITH PROPOFOL N/A 02/02/2017   Dr. Gala Romney: esophageal stenosis s/p dilation and biopsy, medium sized hiatal hernia, normal duodenum   ESOPHAGOGASTRODUODENOSCOPY (EGD) WITH PROPOFOL N/A 07/30/2017   Dr. Gala Romney: esophageal stenosis s/p dilation and biopsy, medium-sized hiatal hernia, normal duodenum   HEMORRHOID SURGERY  01-21-2008    [facility]   IR RADIOLOGIST EVAL & MGMT  08/29/2020   MALONEY DILATION N/A 02/02/2017   Procedure: Venia Minks DILATION;  Surgeon: Daneil Dolin, MD;  [facility];  Service: Endoscopy;  Laterality: N/A;   MALONEY DILATION N/A 07/30/2017   Procedure: Venia Minks DILATION;  Surgeon: Daneil Dolin, MD;  [facility];  Service: Endoscopy;  Laterality: N/A;   POLYPECTOMY  05/03/2018   Procedure: POLYPECTOMY;  Surgeon: Daneil Dolin, MD;  [facility];  Service: Endoscopy;;  ascending colon polyp, sigmoid colon polyps (multiple)   RADIOACTIVE SEED IMPLANT N/A 04/23/2017   Procedure: RADIOACTIVE SEED IMPLANT/BRACHYTHERAPY IMPLANT;  Surgeon: Franchot Gallo, MD;  Location: [facility];  Service: Urology;  Laterality: N/A;   SHOULDER  ARTHROSCOPY  08/05/2003   SPACE OAR INSTILLATION N/A 04/23/2017   Procedure: SPACE OAR INSTILLATION;  Surgeon: Franchot Gallo, MD;  Location: [facility];  Service: Urology;  Laterality: N/A;   TRANSTHORACIC ECHOCARDIOGRAM  03/28/2009   mild LVH, ef 55-60%/ mild aorta calcification   VASECTOMY  08/05/1983   VIDEO ASSISTED THORACOSCOPY (VATS)/DECORTICATION Left     Prior to Admission medications   Medication Sig Start Date End Date Taking? Authorizing Provider  albuterol (PROVENTIL) (2.5 MG/3ML) 0.083% nebulizer solution Take 2.5 mg by nebulization every 6 (six) hours as needed for wheezing or shortness of breath.   Yes [provider]  albuterol (VENTOLIN HFA) 108 (90 Base) MCG/ACT inhaler Inhale 2 puffs into the lungs every 6 (six) hours as needed for wheezing or shortness of breath.   Yes [provider]  allopurinol (ZYLOPRIM) 300 MG tablet Take 300 mg by mouth daily. 09/17/20  Yes [provider]  Artificial Tear Solution (SOOTHE XP OP) Place 1 vial into both eyes in the morning and at bedtime.   Yes [provider]  aspirin EC 81 MG tablet Take 81 mg by mouth daily. Swallow whole.   Yes [provider]  doxycycline (VIBRAMYCIN) 50 MG capsule Take 50 mg by mouth daily. 08/26/20  Yes [provider]  Fluticasone-Umeclidin-Vilant (TRELEGY ELLIPTA) 100-62.5-25 MCG/INH AEPB Inhale 1 puff into the lungs daily. 11/01/20  Yes Icard, Bradley L, DO  psyllium (METAMUCIL SMOOTH TEXTURE) 28 % packet Take 1 packet by mouth daily.   Yes [provider]  acetaminophen (TYLENOL) 500 MG tablet Take 2 tablets (1,000 mg total) by mouth every 6 (six) hours as needed for mild pain. 08/21/20   Meuth, Blaine Hamper, PA-C  Multiple Vitamin (MULTIVITAMIN WITH MINERALS) TABS tablet Take 1 tablet by mouth daily. Patient not taking: No sig reported 08/22/20   Margie Billet A, PA-C    Allergies as of 01/18/2021   (No Known Allergies)    Family  History  Problem Relation Age of Onset   Diabetes Mother    Heart disease Father    Heart disease Brother    Cancer Neg Hx    Colon cancer Neg Hx    Colon polyps Neg Hx    Neuropathy Neg Hx     Social History   Socioeconomic History   Marital status: Married    Spouse name: Not on file   Number of children: 2   Years of education: Not on file   Highest education level: Bachelor's degree (e.g., BA, AB, BS)  Occupational History   Not on file  Tobacco Use   Smoking status: Former    Packs/day: 1.00    Years: 30.00    Pack years: 30.00    Types: Cigarettes, E-cigarettes    Quit date: 10/03/2015    Years since quitting: 5.3   Smokeless tobacco: Never   Tobacco comments:  stoped vaping 2018  Vaping Use   Vaping Use: Former  Substance and Sexual Activity   Alcohol use: Yes    Alcohol/week: 5.0 standard drinks    Types: 5 Glasses of wine per week   Drug use: No   Sexual activity: Yes    Birth control/protection: None  Other Topics Concern   Not on file  Social History Narrative   Lives at home with his wife   Right handed   Social Determinants of Health   Financial Resource Strain: Not on file  Food Insecurity: Not on file  Transportation Needs: Not on file  Physical Activity: Not on file  Stress: Not on file  Social Connections: Not on file  Intimate Partner Violence: Not on file    Review of Systems: See HPI, otherwise negative ROS  Physical Exam: There were no vitals taken for this visit. General:   Alert,  Well-developed, well-nourished, pleasant and cooperative in NAD Neck:  Supple; no masses or thyromegaly. No significant cervical adenopathy. Lungs:  Clear throughout to auscultation.   No wheezes, crackles, or rhonchi. No acute distress. Heart:  Regular rate and rhythm; no murmurs, clicks, rubs,  or gallops. Abdomen: Non-distended, normal bowel sounds.  Soft and nontender without appreciable mass or hepatosplenomegaly.  Pulses:  Normal pulses  noted. Extremities:  Without clubbing or edema.  Impression/Plan: 65 year old gent with a history of colonic adenomas here for surveillance colonoscopy at this time. The risks, benefits, limitations, alternatives and imponderables have been reviewed with the patient. Questions have been answered. All parties are agreeable.      Notice: This dictation was prepared with Dragon dictation along with smaller phrase technology. Any transcriptional errors that result from this process are unintentional and may not be corrected upon review.

## 2021-02-15 NOTE — Transfer of Care (Signed)
Immediate Anesthesia Transfer of Care Note  Patient: [name]  Procedure(s) Performed: COLONOSCOPY WITH PROPOFOL POLYPECTOMY BIOPSY  Patient Location: PACU  Anesthesia Type:General  Level of Consciousness: awake  Airway & Oxygen Therapy: Patient Spontanous Breathing and Patient connected to nasal cannula oxygen  Post-op Assessment: Report given to RN and Post -op Vital signs reviewed and stable  Post vital signs: Reviewed and stable  Last Vitals:  Vitals Value Taken Time  BP 108/63   Temp    Pulse 88   Resp    SpO2 95%     Last Pain:  Vitals:   02/15/21 1358  TempSrc: Oral  PainSc: 0-No pain         Complications: No notable events documented.

## 2021-02-15 NOTE — Anesthesia Preprocedure Evaluation (Signed)
Anesthesia Evaluation  Patient identified by MRN, date of birth, ID band Patient awake    Reviewed: Allergy & Precautions, H&P , NPO status , Patient's Chart, lab work & pertinent test results, reviewed documented beta blocker date and time   Airway Mallampati: I  TM Distance: >3 FB Neck ROM: full    Dental no notable dental hx.    Pulmonary shortness of breath, sleep apnea , COPD, former smoker,    Pulmonary exam normal breath sounds clear to auscultation       Cardiovascular Exercise Tolerance: Good negative cardio ROS   Rhythm:regular Rate:Normal     Neuro/Psych PSYCHIATRIC DISORDERS  Neuromuscular disease    GI/Hepatic Neg liver ROS, hiatal hernia, GERD  Medicated,  Endo/Other  negative endocrine ROS  Renal/GU negative Renal ROS  negative genitourinary   Musculoskeletal   Abdominal   Peds  Hematology negative hematology ROS (+)   Anesthesia Other Findings   Reproductive/Obstetrics negative OB ROS                             Anesthesia Physical Anesthesia Plan  ASA: 3  Anesthesia Plan: General   Post-op Pain Management:    Induction:   PONV Risk Score and Plan: Propofol infusion  Airway Management Planned:   Additional Equipment:   Intra-op Plan:   Post-operative Plan:   Informed Consent: I have reviewed the patients History and Physical, chart, labs and discussed the procedure including the risks, benefits and alternatives for the proposed anesthesia with the patient or authorized representative who has indicated his/her understanding and acceptance.     Dental Advisory Given  Plan Discussed with: CRNA  Anesthesia Plan Comments:         Anesthesia Quick Evaluation

## 2021-02-15 NOTE — Discharge Instructions (Addendum)
  Colonoscopy Discharge Instructions  Read the instructions outlined below and refer to this sheet in the next few weeks. These discharge instructions provide you with general information on caring for yourself after you leave the hospital. Your doctor may also give you specific instructions. While your treatment has been planned according to the most current medical practices available, unavoidable complications occasionally occur. If you have any problems or questions after discharge, call Dr. Gala Romney at [phone]. ACTIVITY You may resume your regular activity, but move at a slower pace for the next 24 hours.  Take frequent rest periods for the next 24 hours.  Walking will help get rid of the air and reduce the bloated feeling in your belly (abdomen).  No driving for 24 hours (because of the medicine (anesthesia) used during the test).   Do not sign any important legal documents or operate any machinery for 24 hours (because of the anesthesia used during the test).  NUTRITION Drink plenty of fluids.  You may resume your normal diet as instructed by your doctor.  Begin with a light meal and progress to your normal diet. Heavy or fried foods are harder to digest and may make you feel sick to your stomach (nauseated).  Avoid alcoholic beverages for 24 hours or as instructed.  MEDICATIONS You may resume your normal medications unless your doctor tells you otherwise.  WHAT YOU CAN EXPECT TODAY Some feelings of bloating in the abdomen.  Passage of more gas than usual.  Spotting of blood in your stool or on the toilet paper.  IF YOU HAD POLYPS REMOVED DURING THE COLONOSCOPY: No aspirin products for 7 days or as instructed.  No alcohol for 7 days or as instructed.  Eat a soft diet for the next 24 hours.  FINDING OUT THE RESULTS OF YOUR TEST Not all test results are available during your visit. If your test results are not back during the visit, make an appointment with your caregiver to find out the  results. Do not assume everything is normal if you have not heard from your caregiver or the medical facility. It is important for you to follow up on all of your test results.  SEEK IMMEDIATE MEDICAL ATTENTION IF: You have more than a spotting of blood in your stool.  Your belly is swollen (abdominal distention).  You are nauseated or vomiting.  You have a temperature over 101.  You have abdominal pain or discomfort that is severe or gets worse throughout the day.      You had multiple polyps in your colon.  Multiple polyps removed  Diverticulosis throughout your colon  Further recommendations to follow pending review of pathology report  At patient request, I called Agustina Caroli at [phone] -rolled to voicemail.  Left a message.

## 2021-02-15 NOTE — Anesthesia Postprocedure Evaluation (Signed)
Anesthesia Post Note  Patient: [name]  Procedure(s) Performed: COLONOSCOPY WITH PROPOFOL POLYPECTOMY BIOPSY  Patient location during evaluation: Phase II Anesthesia Type: General Level of consciousness: awake Pain management: pain level controlled Vital Signs Assessment: post-procedure vital signs reviewed and stable Respiratory status: spontaneous breathing and respiratory function stable Cardiovascular status: blood pressure returned to baseline and stable Postop Assessment: no headache and no apparent nausea or vomiting Anesthetic complications: no Comments: Late entry   No notable events documented.   Last Vitals:  Vitals:   02/15/21 1455 02/15/21 1503  BP: 129/82 (!) 139/96  Pulse: 86 71  Resp: 18 18  Temp:  36.7 C  SpO2: 95% 99%    Last Pain:  Vitals:   02/15/21 1503  TempSrc: Oral  PainSc: 0-No pain                 Louann Sjogren

## 2021-02-19 ENCOUNTER — Encounter: Payer: Self-pay | Admitting: Internal Medicine

## 2021-02-19 LAB — SURGICAL PATHOLOGY

## 2021-02-22 ENCOUNTER — Encounter (HOSPITAL_COMMUNITY): Payer: Self-pay | Admitting: Internal Medicine

## 2021-02-27 ENCOUNTER — Inpatient Hospital Stay (HOSPITAL_COMMUNITY): Payer: 59

## 2021-02-27 ENCOUNTER — Other Ambulatory Visit: Payer: Self-pay

## 2021-02-27 DIAGNOSIS — I82431 Acute embolism and thrombosis of right popliteal vein: Secondary | ICD-10-CM

## 2021-02-27 DIAGNOSIS — C61 Malignant neoplasm of prostate: Secondary | ICD-10-CM | POA: Diagnosis not present

## 2021-02-28 LAB — BETA-2-GLYCOPROTEIN I ABS, IGG/M/A
Beta-2 Glyco I IgG: <9 GPI IgG units (ref 0–20)
Beta-2-Glycoprotein I IgA: <9 GPI IgA units (ref 0–25)
Beta-2-Glycoprotein I IgM: <9 GPI IgM units (ref 0–32)

## 2021-03-01 ENCOUNTER — Other Ambulatory Visit (HOSPITAL_COMMUNITY): Payer: 59

## 2021-03-01 LAB — LUPUS ANTICOAGULANT PANEL
DRVVT: 38 s (ref 0.0–47.0)
PTT Lupus Anticoagulant: 33.5 s (ref 0.0–51.9)

## 2021-03-01 LAB — CARDIOLIPIN ANTIBODIES, IGG, IGM, IGA
Anticardiolipin IgA: <9 APL U/mL (ref 0–11)
Anticardiolipin IgG: <9 GPL U/mL (ref 0–14)
Anticardiolipin IgM: 9 MPL U/mL (ref 0–12)

## 2021-03-07 ENCOUNTER — Other Ambulatory Visit (HOSPITAL_COMMUNITY): Payer: 59

## 2021-03-14 ENCOUNTER — Ambulatory Visit (HOSPITAL_COMMUNITY): Payer: 59 | Admitting: Hematology

## 2021-03-15 ENCOUNTER — Telehealth (HOSPITAL_COMMUNITY): Payer: 59 | Admitting: Physician Assistant

## 2021-03-15 NOTE — Progress Notes (Signed)
Virtual Visit via Telephone Note [facility]  I connected with [name] on 03/18/21  at 2:10 PM by telephone and verified that I am speaking with the correct person using two identifiers.  Location: Patient: Home Provider: [facility]   I discussed the limitations, risks, security and privacy concerns of performing an evaluation and management service by telephone and the availability of in person appointments. I also discussed with the patient that there may be a patient responsible charge related to this service. The patient expressed understanding and agreed to proceed.   HISTORY OF PRESENT ILLNESS: [name] is a 65 year old male who follows at our clinic for his history of provoked lower extremity DVT.  He was last seen by Tarri Abernethy PA-C on 02/01/2021.  Patient reports that he is feeling well today.  He denies any recent hospitalizations, new diagnoses, or changes in his baseline health status since his last visit.  At last visit, his Xarelto was discontinued.  Since that time, he reports that he has been doing well.  He denies any unilateral leg swelling, pain, and erythema.  No shortness of breath, dyspnea on exertion, chest pain, cough, hemoptysis, or palpitations. He denies any signs or symptoms of bleeding such as gross rectal hemorrhage, epistaxis, or melena. He has 100% energy and 100% appetite. He endorses that he is maintaining a stable weight.   OBSERVATIONS/OBJECTIVE: Review of Systems  Constitutional:  Negative for chills, diaphoresis, fever, malaise/fatigue and weight loss.  Respiratory:  Negative for cough and shortness of breath.   Cardiovascular:  Negative for chest pain and palpitations.  Gastrointestinal:  Negative for abdominal pain, blood in stool, melena, nausea and vomiting.  Neurological:  Negative for dizziness and headaches.    PHYSICAL EXAM (per limitations of virtual telephone visit): The patient is alert  and oriented x 3, exhibiting adequate mentation, good mood, and ability to speak in full sentences and execute sound judgement.   ASSESSMENT & PLAN: 1.  Recurrent DVT of right leg, weakly provoked: - Initial DVT in the right leg diagnosed October 2019, provoked by long flight -Treated with Xarelto from October 2019 through February 2020 - Repeat venous duplex on 05/31/2019 was negative for DVT - Recurrent right popliteal DVT diagnosed 08/17/2020, occurred during inpatient hospitalization -  lower extremity Doppler on 08/17/2020 showed age-indeterminate DVT involving the right popliteal vein. - Venous duplex of bilateral lower extremity obtained 09/25/2020 confirmed presence of right popliteal DVT - Completed 65-monthtreatment with Xarelto from 10/02/2020 through 02/01/2021 - Repeat venous duplex (01/23/2021): Resolution of right lower extremity DVT without any residual thrombus - Patient is heterozygous for factor V Leiden, but no indication for lifelong anticoagulation as his prior DVTs have both been provoked - Repeat D-dimer (01/23/2021): Normal at 0.47 - Repeat anticardiolipin antibody, antibeta-2 glycoprotein antibodies, and lupus anticoagulant was repeated after patient stopped Xarelto, NEGATIVE for any hypercoagulable abnormalities - Patient is taking 81 mg aspirin daily - PLAN: No indication for continued anticoagulation at this time.  No need for scheduled follow-up visits at this time.  Patient aware that he should call to schedule another hematology visit if he has any recurrent DVT or PE in the future.   2.  Prostate cancer: -TRUS/biopsy on 12/02/2016.  4 mm left apical nodule, PSA 5.99.  6/12 cores positive for adenocarcinoma, all on the left side of the prostate.  3 cores revealed GS 3+3, 3 cores with GS 3+4.  He underwent I-125 brachytherapy on 04/23/2017. -  Latest PSA on 01/24/2021 was < 0.1 - PLAN: Continue follow-up with Dr. Diona Fanti   FOLLOW UP INSTRUCTIONS: -Can discharge from  clinic for now, but should follow-up with [place] in the future if he develops any recurrent DVT or PE    I discussed the assessment and treatment plan with the patient. The patient was provided an opportunity to ask questions and all were answered. The patient agreed with the plan and demonstrated an understanding of the instructions.   The patient was advised to call back or seek an in-person evaluation if the symptoms worsen or if the condition fails to improve as anticipated.  I provided 12 minutes of non-face-to-face time during this encounter.  Harriett Rush, PA-C 03/18/21 2:22 PM

## 2021-03-18 ENCOUNTER — Other Ambulatory Visit: Payer: Self-pay

## 2021-03-18 ENCOUNTER — Inpatient Hospital Stay (HOSPITAL_COMMUNITY): Payer: 59 | Attending: Hematology and Oncology | Admitting: Physician Assistant

## 2021-03-18 DIAGNOSIS — Z86718 Personal history of other venous thrombosis and embolism: Secondary | ICD-10-CM | POA: Diagnosis not present

## 2021-03-18 DIAGNOSIS — I82431 Acute embolism and thrombosis of right popliteal vein: Secondary | ICD-10-CM

## 2021-04-09 ENCOUNTER — Encounter: Payer: Self-pay | Admitting: *Deleted

## 2021-04-24 ENCOUNTER — Other Ambulatory Visit: Payer: Self-pay

## 2021-04-24 ENCOUNTER — Ambulatory Visit (INDEPENDENT_AMBULATORY_CARE_PROVIDER_SITE_OTHER): Payer: 59 | Admitting: Podiatry

## 2021-04-24 DIAGNOSIS — L309 Dermatitis, unspecified: Secondary | ICD-10-CM | POA: Diagnosis not present

## 2021-04-24 DIAGNOSIS — B351 Tinea unguium: Secondary | ICD-10-CM | POA: Diagnosis not present

## 2021-04-24 NOTE — Progress Notes (Signed)
Subjective:   Patient ID: [name], male   DOB: 65 y.o.   MRN: [id number]   HPI Patient presents stating all nails 1-5 both feet are thick and occasionally bother him at different times and is wondering if there is anything we can do and patient also is a relatively poor health and was just concerned about discoloration of his lower legs.  Patient does smoke a pack per day and has quit and does e-cigarettes periodically but no longer vapes   Review of Systems  All other systems reviewed and are negative.      Objective:  Physical Exam Vitals and nursing note reviewed.  Constitutional:      Appearance: [name] is well-developed.  Pulmonary:     Effort: Pulmonary effort is normal.  Musculoskeletal:        General: Normal range of motion.  Skin:    General: Skin is warm.  Neurological:     Mental Status: [name] is alert.    Neurovascular status found to be moderately reduced as far as PT DP pulses with patient found to have thick brittle tight nailbeds 1-5 both feet that are flattened and dystrophic localized with no other pathology noted.  Patient is found to have good digital perfusion well oriented     Assessment:  Localized mycotic nail infections along with dermatitis lower legs secondary to venous disease     Plan:  H&P discussed conditions I do not recommend current treatment except for getting pedicures to keep the nails down.  Patient will be seen back encouraged to call questions concerns which may come up

## 2021-05-30 ENCOUNTER — Encounter: Payer: Self-pay | Admitting: Pulmonary Disease

## 2021-05-30 ENCOUNTER — Other Ambulatory Visit: Payer: Self-pay

## 2021-05-30 ENCOUNTER — Ambulatory Visit: Payer: 59 | Admitting: Pulmonary Disease

## 2021-05-30 ENCOUNTER — Ambulatory Visit (INDEPENDENT_AMBULATORY_CARE_PROVIDER_SITE_OTHER): Payer: 59 | Admitting: Pulmonary Disease

## 2021-05-30 VITALS — BP 110/82 | HR 98 | Temp 98.0°F | Ht 73.0 in | Wt 240.6 lb

## 2021-05-30 DIAGNOSIS — R0609 Other forms of dyspnea: Secondary | ICD-10-CM

## 2021-05-30 DIAGNOSIS — J449 Chronic obstructive pulmonary disease, unspecified: Secondary | ICD-10-CM | POA: Diagnosis not present

## 2021-05-30 DIAGNOSIS — J849 Interstitial pulmonary disease, unspecified: Secondary | ICD-10-CM | POA: Diagnosis not present

## 2021-05-30 MED ORDER — BREZTRI AEROSPHERE 160-9-4.8 MCG/ACT IN AERO: 2.0000 | INHALATION_SPRAY | Freq: Two times a day (BID) | RESPIRATORY_TRACT | 0 refills | Status: DC

## 2021-05-30 NOTE — Progress Notes (Signed)
Synopsis: Referred in August 2019 for COPD, RUL pulmonary nodule by [name], MD  Subjective:   PATIENT ID: [name] GENDER: male DOB: 03-Sep-1955, MRN: [id number]  Chief Complaint  Patient presents with   Follow-up    Pt. Wants to talk about shortness of breath     Initial office visit: Patient has a past medical history of obstructive sleep apnea, prostate cancer as well as tobacco abuse and has been labeled with COPD in the past.  No prior PFTs.  He was recently seen by his PCP with complaints of worsening shortness of breath, 35-pack-year history of smoking, quit smoking 2 years ago, started vaping but recently quit vaping approximately 3 to 4 months ago. He is active outside, plays golf regularly and swims. 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 He gets short winded, SOB and DOE with any exertion. He feels like he has had to slow down due to his symptoms. 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 He has noticed people asking is he is "ok" are you breathing okay situation. He has a had a few episodes where he was seen in the ED. He was at work and couldn't get his breath and he was transported from work.  He has never been intubated or placed on mechanical ventilation.  He has never needed NIPPV for an exacerbation.  OV 05/12/2018: Since our last visit a PET scan was completed for further evaluation of the lung nodule seen on initial LDCT. This revealed low level uptake now with planned follow up. However there was abnormal uptake within the colon which led to a C-scope by GI revealing multiple polyps, tubular adenomas, no cancer detected and colonic diverticulosis. On 10/1 he had a LE Duplex + Right Popliteal DVT and he was started on Xarelto.  This was considered a provoked DVT following a 4-hour flight coming back from [place].  He did state that his breathing was difficult while he was in [place].  He did have difficulty with exertion  and climbing out of the [facility] when he was out with a group sightseeing.  At this point denies any additional chest pain, wheezing or chest tightness.  He has been using his medication regularly.  OV 07/07/2018: He was started on Trelegy after last office visit.  He is here today for follow-up to make sure that he is still doing well with his breathing.  He is very excited to tell me that he is feeling much better.  He has noticed a significant difference in his ability to walk further and get more done at work.  He still been able to play golf on occasion.  He feels like he is less breathless with significant exertion.  He denies any ongoing cough.  He is somewhat anxious about everything that has happened this past year.  After finding the lung nodule that [name] to be followed up on CT as well as the PET scan which led to evaluation of the colon and the multiple colon polyps.  He also has follow-up with GI shortly to discuss this again.  He also developed a blood clot and currently on Xarelto.  He also has follow-up with hematology after he completes therapy with his Xarelto.  OV 10/07/2018: increased SOB for the past 3 days. He has feeling for the past few days.  Has noticed nocturnal wheezing.  Has been using his nebulized treatments.  He is fixing to go on a  golf trip for the next couple of days.  He has been using his Trelegy inhaler regularly.  He has noticed predominantly more labored breathing with exertion.  He has not had much change in his sputum production.  He does state that he is finishing his Xarelto therapy on 3/7 to complete 3 months.  He did have a recent viral gastroenteritis about a month ago which required ER visit.  He does feel like if he could breathe a little bit better he would be back to his previous baseline.  We did discuss his prior CT imaging that was completed in November.  He is scheduled for 35-month low-dose cancer screening CT follow-up.  OV 06/15/2019: Patient here today for  follow-up regarding COPD.  Patient doing very well today currently managed with Trelegy inhaler plus albuterol.  He rarely uses his albuterol at this time.  He is recently stopped anticoagulation.  Follows with Dr. Delton Coombes his hematologist.  His job position was eliminated due to Covid back in June.  In July he underwent a total hip.  Overall doing very well since then.  Has been playing golf several times a week.  He is also trying to swim regularly to help exercise.  At this time has no significant respiratory complaints or change.  Does have some mucus production throughout the day.  And constantly clearing his throat.  He does believe this is stable at this time.  OV 11/01/2020: Here today for follow-up regarding COPD.  Patient follows with Dr. Delton Coombes for anticoagulation with history of DVT.  Office note reviewed from March 2022.  Also recently seen by rheumatology for gouty arthritis.  Office note also reviewed from Dr. Benjamine Mola.  Overall from a respiratory standpoint doing well.  [name] refills today of his Trelegy.  He has had some issues with insurance in the past.  Will help him with samples today.  Cost at this point is able for him to afford the medicines.  His right ankle has flared up with his gouty arthritis.  He is leaving after the office visit dedicated to go to the beach.  Recently placed on allopurinol by rheumatology.  OV 05/30/2021: Here today for follow up COPD. Currently on trelegy. He still feels SOB on exertion.  Still using his inhalers as directed.  Unfortunately feels like he is more short of breath.  Lung cancer screening CT this past year with lower lobe peripheral changes concerning for possible early ILD.  We reviewed these today in the office.    Past Medical History:  Diagnosis Date   Bilateral shoulder bursitis    Chronic back pain    COPD (chronic obstructive pulmonary disease) with emphysema ([name])    DDD (degenerative disc disease), lumbar    hx epideral injection   L5--S1    Diverticulitis    DVT (deep venous thrombosis) ([place]) 11/19/2018   Dysphasia    intermittant w/ food    Dyspnea    Dyspnea on exertion    GERD (gastroesophageal reflux disease)    Gout    L great toe   Heterozygous factor V Leiden mutation ([place])    Hiatal hernia    Hip problem 2020   left, seeing orthopedics   History of acute pancreatitis    alcoholic pancreatitis [id number] and 01-30-2012   History of colon polyps    10-17-2005  hyperplastic polyp's   History of esophageal stricture    s/p  dilation 02-02-2017   History of peptic ulcer 1990s   History  of pneumococcal pneumonia 2006   bilateral pneumonia w/ left lower lobe abscess treated w/ chest tube with suction for drainage   History of pneumothorax    1984--  left spontaneous pneumothorax ,  treated w/ chest tube   Malignant neoplasm of prostate [facility]) urologist-  dr dahlstedt/  oncologist -- dr Tammi Klippel   dx 12-02-2016-- Stage T2b, Gleason 3+4,  PSA 5.99,  vol 25cc   Numbness of left foot    outer left ankle numb-- per pt has appt. w/ neurologist   OSA (obstructive sleep apnea)    per pt has not used cpap over year ago from 04-16-2017--- per study 01-17-2010  severe osa   Solid nodule of lung greater than 8 mm in diameter 05/11/2018   03/2018: RUL Nodule, 57mm   Wears glasses      Family History  Problem Relation Age of Onset   Diabetes Mother    Heart disease Father    Heart disease Brother    Cancer Neg Hx    Colon cancer Neg Hx    Colon polyps Neg Hx    Neuropathy Neg Hx      Social History   Socioeconomic History   Marital status: Married    Spouse name: Not on file   Number of children: 2   Years of education: Not on file   Highest education level: Bachelor's degree (e.g., BA, AB, BS)  Occupational History   Not on file  Tobacco Use   Smoking status: Former    Packs/day: 1.00    Years: 30.00    Pack years: 30.00    Types: Cigarettes, E-cigarettes    Quit date: 10/03/2015    Years since  quitting: 5.6   Smokeless tobacco: Never   Tobacco comments:    stoped vaping 2018  Vaping Use   Vaping Use: Former  Substance and Sexual Activity   Alcohol use: Yes    Alcohol/week: 5.0 standard drinks    Types: 5 Glasses of wine per week   Drug use: No   Sexual activity: Yes    Birth control/protection: None  Other Topics Concern   Not on file  Social History Narrative   Lives at home with his wife   Right handed   Social Determinants of Health   Financial Resource Strain: Not on file  Food Insecurity: Not on file  Transportation [name]: Not on file  Physical Activity: Not on file  Stress: Not on file  Social Connections: Not on file  Intimate Partner Violence: Not on file     No Known Allergies   Outpatient Medications Prior to Visit  Medication Sig Dispense Refill   acetaminophen (TYLENOL) 500 MG tablet Take 2 tablets (1,000 mg total) by mouth every 6 (six) hours as needed for mild pain. 30 tablet 0   albuterol (PROVENTIL) (2.5 MG/3ML) 0.083% nebulizer solution Take 2.5 mg by nebulization every 6 (six) hours as needed for wheezing or shortness of breath.     albuterol (VENTOLIN HFA) 108 (90 Base) MCG/ACT inhaler Inhale 2 puffs into the lungs every 6 (six) hours as needed for wheezing or shortness of breath.     allopurinol (ZYLOPRIM) 300 MG tablet Take 300 mg by mouth daily.     Artificial Tear Solution (SOOTHE XP OP) Place 1 vial into both eyes in the morning and at bedtime.     aspirin EC 81 MG tablet Take 81 mg by mouth daily. Swallow whole.     doxycycline (VIBRAMYCIN) 50 MG capsule  Take 50 mg by mouth daily.     Fluticasone-Umeclidin-Vilant (TRELEGY ELLIPTA) 100-62.5-25 MCG/INH AEPB Inhale 1 puff into the lungs daily. 60 each 6   Multiple Vitamin (MULTIVITAMIN WITH MINERALS) TABS tablet Take 1 tablet by mouth daily.     psyllium (METAMUCIL SMOOTH TEXTURE) 28 % packet Take 1 packet by mouth daily.     No facility-administered medications prior to visit.     Review of Systems  Constitutional:  Negative for chills, fever, malaise/fatigue and weight loss.  HENT:  Negative for hearing loss, sore throat and tinnitus.   Eyes:  Negative for blurred vision and double vision.  Respiratory:  Positive for shortness of breath. Negative for cough, hemoptysis, sputum production, wheezing and stridor.   Cardiovascular:  Negative for chest pain, palpitations, orthopnea, leg swelling and PND.  Gastrointestinal:  Negative for abdominal pain, constipation, diarrhea, heartburn, nausea and vomiting.  Genitourinary:  Negative for dysuria, hematuria and urgency.  Musculoskeletal:  Negative for joint pain and myalgias.  Skin:  Negative for itching and rash.  Neurological:  Negative for dizziness, tingling, weakness and headaches.  Endo/Heme/Allergies:  Negative for environmental allergies. Does not bruise/bleed easily.  Psychiatric/Behavioral:  Negative for depression. The patient is not nervous/anxious and does not have insomnia.   All other systems reviewed and are negative.   Objective:  Physical Exam Vitals reviewed.  Constitutional:      General: He is not in acute distress.    Appearance: He is well-developed.  HENT:     Head: Normocephalic and atraumatic.  Eyes:     General: No scleral icterus.    Conjunctiva/sclera: Conjunctivae normal.     Pupils: Pupils are equal, round, and reactive to light.  Neck:     Vascular: No JVD.     Trachea: No tracheal deviation.  Cardiovascular:     Rate and Rhythm: Normal rate and regular rhythm.     Heart sounds: Normal heart sounds. No murmur heard. Pulmonary:     Effort: Pulmonary effort is normal. No tachypnea, accessory muscle usage or respiratory distress.     Breath sounds: No stridor. No wheezing, rhonchi or rales.  Abdominal:     General: Bowel sounds are normal. There is no distension.     Palpations: Abdomen is soft.     Tenderness: There is no abdominal tenderness.  Musculoskeletal:         General: No tenderness.     Cervical back: Neck supple.  Lymphadenopathy:     Cervical: No cervical adenopathy.  Skin:    General: Skin is warm and dry.     Capillary Refill: Capillary refill takes less than 2 seconds.     Findings: No rash.  Neurological:     Mental Status: He is alert and oriented to person, place, and time.  Psychiatric:        Behavior: Behavior normal.    Vitals:   05/30/21 1139  BP: 110/82  Pulse: 98  Temp: 98 F (36.7 C)  TempSrc: Oral  SpO2: 98%  Weight: 240 lb 9.6 oz (109.1 kg)  Height: 6\' 1"  (1.854 m)   98% on RA BMI Readings from Last 3 Encounters:  05/30/21 31.74 kg/m  02/01/21 29.92 kg/m  01/08/21 28.89 kg/m   Wt Readings from Last 3 Encounters:  05/30/21 240 lb 9.6 oz (109.1 kg)  02/01/21 226 lb 12.8 oz (102.9 kg)  01/08/21 219 lb (99.3 kg)     CBC    Component Value Date/Time   WBC 8.6 11/14/2020  1050   RBC 4.37 11/14/2020 1050   HGB 13.5 11/14/2020 1050   HCT 42.1 11/14/2020 1050   PLT 242 11/14/2020 1050   MCV 96.3 11/14/2020 1050   MCH 30.9 11/14/2020 1050   MCHC 32.1 11/14/2020 1050   RDW 13.9 11/14/2020 1050   LYMPHSABS 2.7 08/27/2020 0920   MONOABS 0.7 08/27/2020 0920   EOSABS 0.4 08/27/2020 0920   BASOSABS 0.1 08/27/2020 0920     Chest Imaging: LDCT 03/2018 - RUL Lung nodule, approximately 32mm with areas of surrounding scar, significant BL upper lobe predominant emphysema The patient's images have been independently reviewed by me.    PET Scan: 03/23/2018 Mild uptake in the RUL density. Extensive colonic uptake from PET scan  07/20/2018: CT chest  Lung RADS 2, three-vessel coronary disease, stable right upper lobe pulmonary nodule. The patient's images have been independently reviewed by me.    December 2020: CT chest lung cancer screening pending  Vascular duplex 09/25/2020: Right lower extremity short segment popliteal DVT.  Lung cancer screening CT 08/03/2019: Lung RADS 2S.  Evidence of centrilobular  and paraseptal emphysema. Three-vessel coronary disease.  No concerning nodules. The patient's images have been independently reviewed by me.    2022 lung cancer screening CT: Bilateral lower lobe areas of interstitial markings, possible NSIP. The patient's images have been independently reviewed by me.    Pulmonary Functions Testing Results: PFT Results Latest Ref Rng & Units 03/22/2018  FVC-Pre L 3.30  FVC-Predicted Pre % 63  FVC-Post L 3.40  FVC-Predicted Post % 65  Pre FEV1/FVC % % 64  Post FEV1/FCV % % 69  FEV1-Pre L 2.12  FEV1-Predicted Pre % 54  FEV1-Post L 2.36  DLCO uncorrected ml/min/mmHg 18.65  DLCO UNC% % 52  DLVA Predicted % 69  TLC L 4.69  TLC % Predicted % 62  RV % Predicted % 38    FeNO: None  Pathology: None   Echocardiogram: None   Heart Catheterization: None     Assessment & Plan:   Interstitial pulmonary disease ([name]) - Plan: CT CHEST HIGH RESOLUTION, Pulmonary Function Test  Moderate COPD (chronic obstructive pulmonary disease) ([name]) - Plan: Pulmonary Function Test  DOE (dyspnea on exertion)   Discussion:  65 year old gentleman moderate COPD on Trelegy.  He still has progressive decline in shortness of breath and functional status over the past couple of months.  Reviewed his lung cancer screening CT which shows lower lobe interstitial markings and changes.  Plan: I think we should work-up his progressive dyspnea changes. HRCT scan of the chest Stop Trelegy Start [place], to see if this makes any difference. Repeat full pulmonary function test. Return to clinic in a few weeks after PFTs and HRCT complete. If there are definite HRCT changes concerning for ILD would get patient set up in ILD clinic for new consultation with Dr. Chase Caller.    Current Outpatient Medications:    acetaminophen (TYLENOL) 500 MG tablet, Take 2 tablets (1,000 mg total) by mouth every 6 (six) hours as needed for mild pain., Disp: 30 tablet, Rfl: 0   albuterol  (PROVENTIL) (2.5 MG/3ML) 0.083% nebulizer solution, Take 2.5 mg by nebulization every 6 (six) hours as needed for wheezing or shortness of breath., Disp: , Rfl:    albuterol (VENTOLIN HFA) 108 (90 Base) MCG/ACT inhaler, Inhale 2 puffs into the lungs every 6 (six) hours as needed for wheezing or shortness of breath., Disp: , Rfl:    allopurinol (ZYLOPRIM) 300 MG tablet, Take 300 mg by mouth  daily., Disp: , Rfl:    Artificial Tear Solution (SOOTHE XP OP), Place 1 vial into both eyes in the morning and at bedtime., Disp: , Rfl:    aspirin EC 81 MG tablet, Take 81 mg by mouth daily. Swallow whole., Disp: , Rfl:    doxycycline (VIBRAMYCIN) 50 MG capsule, Take 50 mg by mouth daily., Disp: , Rfl:    Fluticasone-Umeclidin-Vilant (TRELEGY ELLIPTA) 100-62.5-25 MCG/INH AEPB, Inhale 1 puff into the lungs daily., Disp: 60 each, Rfl: 6   Multiple Vitamin (MULTIVITAMIN WITH MINERALS) TABS tablet, Take 1 tablet by mouth daily., Disp: , Rfl:    psyllium (METAMUCIL SMOOTH TEXTURE) 28 % packet, Take 1 packet by mouth daily., Disp: , Rfl:    Garner Nash, DO [facility] 05/30/2021 12:05 PM

## 2021-05-30 NOTE — Patient Instructions (Addendum)
Thank you for visiting Dr. Valeta Harms at [facility]. Today we recommend the following:  Orders Placed This Encounter  Procedures   CT CHEST HIGH RESOLUTION   Pulmonary Function Test   Stop trelegy  Start Breztri samples with spacer and new prescription   Please scheduled HRCT and PFTs prior to next office visit   Return in about 4 weeks (around 06/27/2021) for with APP or Dr. Valeta Harms.    Please do your part to reduce the spread of COVID-19.

## 2021-06-07 ENCOUNTER — Ambulatory Visit (HOSPITAL_COMMUNITY)
Admission: RE | Admit: 2021-06-07 | Discharge: 2021-06-07 | Disposition: A | Discharge status: Home / Self Care | Payer: 59 | Source: Ambulatory Visit | Attending: Pulmonary Disease | Admitting: Pulmonary Disease

## 2021-06-07 ENCOUNTER — Encounter (HOSPITAL_COMMUNITY): Payer: Self-pay

## 2021-06-07 ENCOUNTER — Other Ambulatory Visit: Payer: Self-pay

## 2021-06-07 DIAGNOSIS — J849 Interstitial pulmonary disease, unspecified: Secondary | ICD-10-CM | POA: Insufficient documentation

## 2021-06-13 DIAGNOSIS — Z79899 Other long term (current) drug therapy: Secondary | ICD-10-CM | POA: Insufficient documentation

## 2021-06-13 DIAGNOSIS — E559 Vitamin D deficiency, unspecified: Secondary | ICD-10-CM | POA: Insufficient documentation

## 2021-06-13 DIAGNOSIS — G629 Polyneuropathy, unspecified: Secondary | ICD-10-CM | POA: Insufficient documentation

## 2021-06-14 NOTE — Progress Notes (Signed)
Please let the patient know I have reviewed his CT imaging and there are no clear evidence of interstitial lung disease.  He does have changes consistent with his severe COPD.  We will discuss in the remainder of the imaging details at the office visit coming up.  Is 9 mm right upper lobe nodule is also stable.  Thanks,  BLI  Garner Nash, DO [facility] 06/14/2021 4:44 PM

## 2021-06-15 ENCOUNTER — Encounter: Payer: Self-pay | Admitting: *Deleted

## 2021-07-03 ENCOUNTER — Ambulatory Visit: Payer: 59 | Admitting: Pulmonary Disease

## 2021-07-04 ENCOUNTER — Other Ambulatory Visit: Payer: Self-pay

## 2021-07-04 ENCOUNTER — Ambulatory Visit (INDEPENDENT_AMBULATORY_CARE_PROVIDER_SITE_OTHER): Payer: 59 | Admitting: Pulmonary Disease

## 2021-07-04 DIAGNOSIS — J849 Interstitial pulmonary disease, unspecified: Secondary | ICD-10-CM

## 2021-07-04 DIAGNOSIS — J449 Chronic obstructive pulmonary disease, unspecified: Secondary | ICD-10-CM

## 2021-07-04 LAB — PULMONARY FUNCTION TEST
DL/VA % pred: 87 %
DL/VA: 3.58 ml/min/mmHg/L
DLCO cor % pred: 67 %
DLCO cor: 19.86 ml/min/mmHg
DLCO unc % pred: 67 %
DLCO unc: 19.86 ml/min/mmHg
FEF 25-75 Post: 1.84 L/sec
FEF 25-75 Pre: 1.27 L/sec
FEF2575-%Change-Post: 44 %
FEF2575-%Pred-Post: 60 %
FEF2575-%Pred-Pre: 41 %
FEV1-%Change-Post: 7 %
FEV1-%Pred-Post: 66 %
FEV1-%Pred-Pre: 62 %
FEV1-Post: 2.58 L
FEV1-Pre: 2.41 L
FEV1FVC-%Change-Post: 9 %
FEV1FVC-%Pred-Pre: 90 %
FEV6-%Change-Post: 0 %
FEV6-%Pred-Post: 71 %
FEV6-%Pred-Pre: 71 %
FEV6-Post: 3.5 L
FEV6-Pre: 3.52 L
FEV6FVC-%Change-Post: 1 %
FEV6FVC-%Pred-Post: 104 %
FEV6FVC-%Pred-Pre: 103 %
FVC-%Change-Post: -1 %
FVC-%Pred-Post: 68 %
FVC-%Pred-Pre: 69 %
FVC-Post: 3.51 L
FVC-Pre: 3.57 L
Post FEV1/FVC ratio: 74 %
Post FEV6/FVC ratio: 100 %
Pre FEV1/FVC ratio: 67 %
Pre FEV6/FVC Ratio: 99 %
RV % pred: 89 %
RV: 2.23 L
TLC % pred: 77 %
TLC: 5.91 L

## 2021-07-04 NOTE — Progress Notes (Signed)
Full PFT completed today ? ?

## 2021-07-05 ENCOUNTER — Encounter: Payer: Self-pay | Admitting: Pulmonary Disease

## 2021-07-05 ENCOUNTER — Ambulatory Visit (INDEPENDENT_AMBULATORY_CARE_PROVIDER_SITE_OTHER): Payer: 59 | Admitting: Pulmonary Disease

## 2021-07-05 VITALS — BP 142/72 | HR 94 | Temp 97.6°F | Ht 73.0 in | Wt 237.6 lb

## 2021-07-05 DIAGNOSIS — Z7901 Long term (current) use of anticoagulants: Secondary | ICD-10-CM

## 2021-07-05 DIAGNOSIS — I2584 Coronary atherosclerosis due to calcified coronary lesion: Secondary | ICD-10-CM

## 2021-07-05 DIAGNOSIS — R0609 Other forms of dyspnea: Secondary | ICD-10-CM | POA: Diagnosis not present

## 2021-07-05 DIAGNOSIS — J449 Chronic obstructive pulmonary disease, unspecified: Secondary | ICD-10-CM

## 2021-07-05 DIAGNOSIS — Z86718 Personal history of other venous thrombosis and embolism: Secondary | ICD-10-CM

## 2021-07-05 DIAGNOSIS — I2510 Atherosclerotic heart disease of native coronary artery without angina pectoris: Secondary | ICD-10-CM | POA: Diagnosis not present

## 2021-07-05 DIAGNOSIS — Z87891 Personal history of nicotine dependence: Secondary | ICD-10-CM

## 2021-07-05 NOTE — Progress Notes (Signed)
Synopsis: Referred in August 2019 for COPD, RUL pulmonary nodule by Joe Evens, MD  Subjective:   PATIENT ID: [name] GENDER: male DOB: 1956-01-15, MRN: [id number]  Chief Complaint  Patient presents with   Follow-up    Patient is here to follow up on results of scan and tests    Initial office visit: Patient has a past medical history of obstructive sleep apnea, prostate cancer as well as tobacco abuse and has been labeled with COPD in the past.  No prior PFTs.  He was recently seen by his PCP with complaints of worsening shortness of breath, 35-pack-year history of smoking, quit smoking 2 years ago, started vaping but recently quit vaping approximately 3 to 4 months ago. He is active outside, plays golf regularly and swims. 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 He gets short winded, SOB and DOE with any exertion. He feels like he has had to slow down due to his symptoms. 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 He has noticed people asking is he is "ok" are you breathing okay situation. He has a had a few episodes where he was seen in the ED. He was at work and couldn't get his breath and he was transported from work.  He has never been intubated or placed on mechanical ventilation.  He has never needed NIPPV for an exacerbation.  OV 05/12/2018: Since our last visit a PET scan was completed for further evaluation of the lung nodule seen on initial LDCT. This revealed low level uptake now with planned follow up. However there was abnormal uptake within the colon which led to a C-scope by GI revealing multiple polyps, tubular adenomas, no cancer detected and colonic diverticulosis. On 10/1 he had a LE Duplex + Right Popliteal DVT and he was started on Xarelto.  This was considered a provoked DVT following a 4-hour flight coming back from [place].  He did state that his breathing was difficult while he was in [place].  He did have difficulty  with exertion and climbing out of the [facility] when he was out with a group sightseeing.  At this point denies any additional chest pain, wheezing or chest tightness.  He has been using his medication regularly.  OV 07/07/2018: He was started on Trelegy after last office visit.  He is here today for follow-up to make sure that he is still doing well with his breathing.  He is very excited to tell me that he is feeling much better.  He has noticed a significant difference in his ability to walk further and get more done at work.  He still been able to play golf on occasion.  He feels like he is less breathless with significant exertion.  He denies any ongoing cough.  He is somewhat anxious about everything that has happened this past year.  After finding the lung nodule that [name] to be followed up on CT as well as the PET scan which led to evaluation of the colon and the multiple colon polyps.  He also has follow-up with GI shortly to discuss this again.  He also developed a blood clot and currently on Xarelto.  He also has follow-up with hematology after he completes therapy with his Xarelto.  OV 10/07/2018: increased SOB for the past 3 days. He has feeling for the past few days.  Has noticed nocturnal wheezing.  Has been using his nebulized treatments.  He is fixing to  go on a golf trip for the next couple of days.  He has been using his Trelegy inhaler regularly.  He has noticed predominantly more labored breathing with exertion.  He has not had much change in his sputum production.  He does state that he is finishing his Xarelto therapy on 3/7 to complete 3 months.  He did have a recent viral gastroenteritis about a month ago which required ER visit.  He does feel like if he could breathe a little bit better he would be back to his previous baseline.  We did discuss his prior CT imaging that was completed in November.  He is scheduled for 58-month low-dose cancer screening CT follow-up.  OV 06/15/2019: Patient  here today for follow-up regarding COPD.  Patient doing very well today currently managed with Trelegy inhaler plus albuterol.  He rarely uses his albuterol at this time.  He is recently stopped anticoagulation.  Follows with Dr. Delton [name] his hematologist.  His job position was eliminated due to Covid back in June.  In July he underwent a total hip.  Overall doing very well since then.  Has been playing golf several times a week.  He is also trying to swim regularly to help exercise.  At this time has no significant respiratory complaints or change.  Does have some mucus production throughout the day.  And constantly clearing his throat.  He does believe this is stable at this time.  OV 11/01/2020: Here today for follow-up regarding COPD.  Patient follows with Dr. Delton [name] for anticoagulation with history of DVT.  Office note reviewed from March 2022.  Also recently seen by rheumatology for gouty arthritis.  Office note also reviewed from Dr. Benjamine [name].  Overall from a respiratory standpoint doing well.  [name] refills today of his Trelegy.  He has had some issues with insurance in the past.  Will help him with samples today.  Cost at this point is able for him to afford the medicines.  His right ankle has flared up with his gouty arthritis.  He is leaving after the office visit dedicated to go to the beach.  Recently placed on allopurinol by rheumatology.  OV 05/30/2021: Here today for follow up COPD. Currently on trelegy. He still feels SOB on exertion.  Still using his inhalers as directed.  Unfortunately feels like he is more short of breath.  Lung cancer screening CT this past year with lower lobe peripheral changes concerning for possible early ILD.  We reviewed these today in the office.  OV 07/05/2021: Here today for follow-up after PFTs and HRCT.  He has significant upper lobe centrilobular emphysema.  No significant evidence of fibrosis on CT.  We reviewed his PFTs that were completed yesterday.  He does  have a mixed obstructive restrictive defect.  I think the restrictive defect is likely related to his weight.  He does not have any really significant change from his 2019 PFTs until now.  He still has breathlessness with exertion.  We did switch his inhalers from Trelegy to [facility].  He does like being on the [place] but has not noticed much difference in his symptom management.    Past Medical History:  Diagnosis Date   Bilateral shoulder bursitis    Chronic back pain    COPD (chronic obstructive pulmonary disease) with emphysema ([name])    DDD (degenerative disc disease), lumbar    hx epideral injection  L5--S1    Diverticulitis    DVT (deep venous thrombosis) ([place]) 11/19/2018  Dysphasia    intermittant w/ food    Dyspnea    Dyspnea on exertion    GERD (gastroesophageal reflux disease)    Gout    L great toe   Heterozygous factor V Leiden mutation ([place])    Hiatal hernia    Hip problem 2020   left, seeing orthopedics   History of acute pancreatitis    alcoholic pancreatitis [id number] and 01-30-2012   History of colon polyps    10-17-2005  hyperplastic polyp's   History of esophageal stricture    s/p  dilation 02-02-2017   History of peptic ulcer 1990s   History of pneumococcal pneumonia 2006   bilateral pneumonia w/ left lower lobe abscess treated w/ chest tube with suction for drainage   History of pneumothorax    1984--  left spontaneous pneumothorax ,  treated w/ chest tube   Malignant neoplasm of prostate [facility]) urologist-  dr dahlstedt/  oncologist -- dr Tammi Klippel   dx 12-02-2016-- Stage T2b, Gleason 3+4,  PSA 5.99,  vol 25cc   Numbness of left foot    outer left ankle numb-- per pt has appt. w/ neurologist   OSA (obstructive sleep apnea)    per pt has not used cpap over year ago from 04-16-2017--- per study 01-17-2010  severe osa   Solid nodule of lung greater than 8 mm in diameter 05/11/2018   03/2018: RUL Nodule, 71mm   Wears glasses      Family History  Problem  Relation Age of Onset   Diabetes Mother    Heart disease Father    Heart disease Brother    Cancer Neg Hx    Colon cancer Neg Hx    Colon polyps Neg Hx    Neuropathy Neg Hx      Social History   Socioeconomic History   Marital status: Married    Spouse name: Not on file   Number of children: 2   Years of education: Not on file   Highest education level: Bachelor's degree (e.g., BA, AB, BS)  Occupational History   Not on file  Tobacco Use   Smoking status: Former    Packs/day: 1.00    Years: 30.00    Pack years: 30.00    Types: Cigarettes, E-cigarettes    Quit date: 10/03/2015    Years since quitting: 5.7   Smokeless tobacco: Never   Tobacco comments:    stoped vaping 2018  Vaping Use   Vaping Use: Former  Substance and Sexual Activity   Alcohol use: Yes    Alcohol/week: 5.0 standard drinks    Types: 5 Glasses of wine per week   Drug use: No   Sexual activity: Yes    Birth control/protection: None  Other Topics Concern   Not on file  Social History Narrative   Lives at home with his wife   Right handed   Social Determinants of Health   Financial Resource Strain: Not on file  Food Insecurity: Not on file  Transportation [name]: Not on file  Physical Activity: Not on file  Stress: Not on file  Social Connections: Not on file  Intimate Partner Violence: Not on file     No Known Allergies   Outpatient Medications Prior to Visit  Medication Sig Dispense Refill   acetaminophen (TYLENOL) 500 MG tablet Take 2 tablets (1,000 mg total) by mouth every 6 (six) hours as needed for mild pain. 30 tablet 0   albuterol (PROVENTIL) (2.5 MG/3ML) 0.083% nebulizer solution Take 2.5  mg by nebulization every 6 (six) hours as needed for wheezing or shortness of breath.     albuterol (VENTOLIN HFA) 108 (90 Base) MCG/ACT inhaler Inhale 2 puffs into the lungs every 6 (six) hours as needed for wheezing or shortness of breath.     allopurinol (ZYLOPRIM) 300 MG tablet Take 300 mg by  mouth daily.     Artificial Tear Solution (SOOTHE XP OP) Place 1 vial into both eyes in the morning and at bedtime.     aspirin EC 81 MG tablet Take 81 mg by mouth daily. Swallow whole.     Budeson-Glycopyrrol-Formoterol (BREZTRI AEROSPHERE) 160-9-4.8 MCG/ACT AERO Inhale 2 puffs into the lungs in the morning and at bedtime. 10.7 g 0   Budeson-Glycopyrrol-Formoterol (BREZTRI AEROSPHERE) 160-9-4.8 MCG/ACT AERO Inhale 2 puffs into the lungs in the morning and at bedtime. 10.7 g 0   doxycycline (VIBRAMYCIN) 50 MG capsule Take 50 mg by mouth daily.     Fluticasone-Umeclidin-Vilant (TRELEGY ELLIPTA) 100-62.5-25 MCG/INH AEPB Inhale 1 puff into the lungs daily. 60 each 6   Multiple Vitamin (MULTIVITAMIN WITH MINERALS) TABS tablet Take 1 tablet by mouth daily.     psyllium (METAMUCIL SMOOTH TEXTURE) 28 % packet Take 1 packet by mouth daily.     No facility-administered medications prior to visit.    Review of Systems  Constitutional:  Negative for chills, fever, malaise/fatigue and weight loss.  HENT:  Negative for hearing loss, sore throat and tinnitus.   Eyes:  Negative for blurred vision and double vision.  Respiratory:  Positive for shortness of breath. Negative for cough, hemoptysis, sputum production, wheezing and stridor.   Cardiovascular:  Negative for chest pain, palpitations, orthopnea, leg swelling and PND.  Gastrointestinal:  Negative for abdominal pain, constipation, diarrhea, heartburn, nausea and vomiting.  Genitourinary:  Negative for dysuria, hematuria and urgency.  Musculoskeletal:  Negative for joint pain and myalgias.  Skin:  Negative for itching and rash.  Neurological:  Negative for dizziness, tingling, weakness and headaches.  Endo/Heme/Allergies:  Negative for environmental allergies. Does not bruise/bleed easily.  Psychiatric/Behavioral:  Negative for depression. The patient is not nervous/anxious and does not have insomnia.   All other systems reviewed and are  negative.   Objective:  Physical Exam Vitals reviewed.  Constitutional:      General: He is not in acute distress.    Appearance: He is well-developed. He is obese.  HENT:     Head: Normocephalic and atraumatic.  Eyes:     General: No scleral icterus.    Conjunctiva/sclera: Conjunctivae normal.     Pupils: Pupils are equal, round, and reactive to light.  Neck:     Vascular: No JVD.     Trachea: No tracheal deviation.  Cardiovascular:     Rate and Rhythm: Normal rate and regular rhythm.     Heart sounds: Normal heart sounds. No murmur heard. Pulmonary:     Effort: Pulmonary effort is normal. No tachypnea, accessory muscle usage or respiratory distress.     Breath sounds: No stridor. No wheezing, rhonchi or rales.     Comments: No wheeze no crackles Abdominal:     General: There is no distension.     Palpations: Abdomen is soft.     Tenderness: There is no abdominal tenderness.  Musculoskeletal:        General: No tenderness.     Cervical back: Neck supple.  Lymphadenopathy:     Cervical: No cervical adenopathy.  Skin:    General: Skin is warm  and dry.     Capillary Refill: Capillary refill takes less than 2 seconds.     Findings: No rash.  Neurological:     Mental Status: He is alert and oriented to person, place, and time.  Psychiatric:        Behavior: Behavior normal.    Vitals:   07/05/21 0911  BP: (!) 142/72  Pulse: 94  Temp: 97.6 F (36.4 C)  TempSrc: Oral  SpO2: 96%  Weight: 237 lb 9.6 oz (107.8 kg)  Height: 6\' 1"  (1.854 m)   96% on RA BMI Readings from Last 3 Encounters:  07/05/21 31.35 kg/m  05/30/21 31.74 kg/m  02/01/21 29.92 kg/m   Wt Readings from Last 3 Encounters:  07/05/21 237 lb 9.6 oz (107.8 kg)  05/30/21 240 lb 9.6 oz (109.1 kg)  02/01/21 226 lb 12.8 oz (102.9 kg)     CBC    Component Value Date/Time   WBC 8.6 11/14/2020 1050   RBC 4.37 11/14/2020 1050   HGB 13.5 11/14/2020 1050   HCT 42.1 11/14/2020 1050   PLT 242  11/14/2020 1050   MCV 96.3 11/14/2020 1050   MCH 30.9 11/14/2020 1050   MCHC 32.1 11/14/2020 1050   RDW 13.9 11/14/2020 1050   LYMPHSABS 2.7 08/27/2020 0920   MONOABS 0.7 08/27/2020 0920   EOSABS 0.4 08/27/2020 0920   BASOSABS 0.1 08/27/2020 0920     Chest Imaging: LDCT 03/2018 - RUL Lung nodule, approximately 27mm with areas of surrounding scar, significant BL upper lobe predominant emphysema The patient's images have been independently reviewed by me.    PET Scan: 03/23/2018 Mild uptake in the RUL density. Extensive colonic uptake from PET scan  07/20/2018: CT chest  Lung RADS 2, three-vessel coronary disease, stable right upper lobe pulmonary nodule. The patient's images have been independently reviewed by me.    December 2020: CT chest lung cancer screening pending  Vascular duplex 09/25/2020: Right lower extremity short segment popliteal DVT.  Lung cancer screening CT 08/03/2019: Lung RADS 2S.  Evidence of centrilobular and paraseptal emphysema. Three-vessel coronary disease.  No concerning nodules. The patient's images have been independently reviewed by me.    2022 lung cancer screening CT: Bilateral lower lobe areas of interstitial markings, possible NSIP. The patient's images have been independently reviewed by me.    CT chest 06/09/2021: No obvious sign of interstitial lung disease, moderate to severe centrilobular emphysema three-vessel coronary disease. The patient's images have been independently reviewed by me.     Pulmonary Functions Testing Results: PFT Results Latest Ref Rng & Units 07/04/2021 03/22/2018  FVC-Pre L 3.57 3.30  FVC-Predicted Pre % 69 63  FVC-Post L 3.51 3.40  FVC-Predicted Post % 68 65  Pre FEV1/FVC % % 67 64  Post FEV1/FCV % % 74 69  FEV1-Pre L 2.41 2.12  FEV1-Predicted Pre % 62 54  FEV1-Post L 2.58 2.36  DLCO uncorrected ml/min/mmHg 19.86 18.65  DLCO UNC% % 67 52  DLCO corrected ml/min/mmHg 19.86 -  DLCO COR %Predicted % 67 -  DLVA  Predicted % 87 69  TLC L 5.91 4.69  TLC % Predicted % 77 62  RV % Predicted % 89 38    FeNO: None  Pathology: None   Echocardiogram: None   Heart Catheterization: None     Assessment & Plan:   DOE (dyspnea on exertion) - Plan: Ambulatory referral to Cardiology  Coronary artery calcification - Plan: Ambulatory referral to Cardiology  Moderate COPD (chronic obstructive pulmonary disease) ([place])  History of deep venous thrombosis  Former smoker  On continuous oral anticoagulation   Discussion:  This is a 65 year old gentleman moderate COPD currently on triple therapy inhaler.  His PFTs from 2019 until now have not significantly changed.  He had a HRCT scan of the chest with no significant evidence of ILD, no fibrosis, he does have upper lobe central lobular emphysema.  He does have ongoing dyspnea on exertion despite max inhaler regimen and treatment of his COPD.  He does have significant coronary calcifications  Plan: Due to his ongoing progressive dyspnea on exertion complaints. Will replace referral to cardiology. Question whether or not he [name] a stress test or consideration for a CTA coronary to look for obstructive coronary disease. We will continue him on his current inhaler regimen, continue Breztri. His insurance is changing in January and he may need to swap this around. He is to call us and let us know.  If he [name] to switch back to Trelegy that is okay as well based on insurance.  Would like to keep him on triple therapy inhaler regimen. Patient return to see [place] in 6 months or as needed. I will send a message over to cardiology.    Current Outpatient Medications:    acetaminophen (TYLENOL) 500 MG tablet, Take 2 tablets (1,000 mg total) by mouth every 6 (six) hours as needed for mild pain., Disp: 30 tablet, Rfl: 0   albuterol (PROVENTIL) (2.5 MG/3ML) 0.083% nebulizer solution, Take 2.5 mg by nebulization every 6 (six) hours as needed for wheezing or shortness  of breath., Disp: , Rfl:    albuterol (VENTOLIN HFA) 108 (90 Base) MCG/ACT inhaler, Inhale 2 puffs into the lungs every 6 (six) hours as needed for wheezing or shortness of breath., Disp: , Rfl:    allopurinol (ZYLOPRIM) 300 MG tablet, Take 300 mg by mouth daily., Disp: , Rfl:    Artificial Tear Solution (SOOTHE XP OP), Place 1 vial into both eyes in the morning and at bedtime., Disp: , Rfl:    aspirin EC 81 MG tablet, Take 81 mg by mouth daily. Swallow whole., Disp: , Rfl:    Budeson-Glycopyrrol-Formoterol (BREZTRI AEROSPHERE) 160-9-4.8 MCG/ACT AERO, Inhale 2 puffs into the lungs in the morning and at bedtime., Disp: 10.7 g, Rfl: 0   Budeson-Glycopyrrol-Formoterol (BREZTRI AEROSPHERE) 160-9-4.8 MCG/ACT AERO, Inhale 2 puffs into the lungs in the morning and at bedtime., Disp: 10.7 g, Rfl: 0   doxycycline (VIBRAMYCIN) 50 MG capsule, Take 50 mg by mouth daily., Disp: , Rfl:    Fluticasone-Umeclidin-Vilant (TRELEGY ELLIPTA) 100-62.5-25 MCG/INH AEPB, Inhale 1 puff into the lungs daily., Disp: 60 each, Rfl: 6   Multiple Vitamin (MULTIVITAMIN WITH MINERALS) TABS tablet, Take 1 tablet by mouth daily., Disp: , Rfl:    psyllium (METAMUCIL SMOOTH TEXTURE) 28 % packet, Take 1 packet by mouth daily., Disp: , Rfl:    Garner Nash, DO [facility] 07/05/2021 9:23 AM

## 2021-07-05 NOTE — Patient Instructions (Signed)
Thank you for visiting Dr. Valeta Harms at [facility]. Today we recommend the following:  Orders Placed This Encounter  Procedures   Ambulatory referral to Cardiology   Stay on current inhaler regimen.  Call us if you need refills when you insurance changes.   Return in about 6 months (around 01/03/2022) for with APP or Dr. Valeta Harms.    Please do your part to reduce the spread of COVID-19.

## 2021-07-10 ENCOUNTER — Encounter: Payer: Self-pay | Admitting: Pulmonary Disease

## 2021-07-10 DIAGNOSIS — J449 Chronic obstructive pulmonary disease, unspecified: Secondary | ICD-10-CM

## 2021-07-10 NOTE — Telephone Encounter (Signed)
Please advise if ok to send in Trelegy.Thanks

## 2021-07-11 MED ORDER — TRELEGY ELLIPTA 100-62.5-25 MCG/ACT IN AEPB: 1.0000 | INHALATION_SPRAY | Freq: Every day | RESPIRATORY_TRACT | 5 refills | Status: DC

## 2021-07-16 NOTE — H&P (View-Only) (Signed)
Cardiology Office Note:    Date:  07/17/2021   ID:  [name], DOB Feb 17, 1956, MRN [id number]  PCP:  Joe Evens, MD  Cardiologist:  Joe More, MD   Referring MD: Joe Nash, [name]  ASSESSMENT:    1. Precordial pain   2. SOB (shortness of breath) on exertion   3. Coronary artery calcification seen on CT scan   4. Centrilobular emphysema ([place])   5. Deep vein thrombosis (DVT) of popliteal vein of right lower extremity, unspecified chronicity ([name])    PLAN:       Joe Priest, MD Physician Cardiology Telephone Encounter Signed Encounter Date:  07/25/2021   Signed               I contacted [name] and we reviewed the results of his cardiac CTA   He is severely limited with anginal equivalent shortness of breath out of proportion to his lung disease is a very high calcium score 2641 99th percentile for age and sex and he has significant CAD with severe stenosis left anterior descending coronary artery proximal.   Advised to undergo coronary angiography with high risk CTA options benefits risks detailed and he agrees.   Would like to [name] it before the first of the year if possible we will see what is available tomorrow we will initiate high intensity statin and will leave him on a low-dose beta-blocker.        Electronically signed by Joe Priest, MD at 07/25/2021  4:26 PM  In order of problems listed above:  Although he has significant COPD he also has coronary artery calcification and symptoms that may well be anginal equivalent exertional shortness of breath severe and limiting as well as non anginal chest pain.  He is quite interested in improving the quality of his life disappointed he has not improved [name] with his bronchodilators and after discussion wants to undergo an ischemia evaluation.  I think the only 2 modalities that would work for him would either be direct referral for left heart catheterization or cardiac CTA.  He prefers and I  recommend a cardiac CTA unguinal put him on a beta-blocker to control his heart rate get good quality images and make a decision afterwards about starting statin therapy.  If he has severe flow-limiting stenosis he would benefit from revascularization in view of his lung disease preferably percutaneously.  If he does not have flow-limiting stenosis he may benefit from other modalities like pulmonary rehab. Stable COPD managed by his pulmonologist Stable previously anticoagulated for provoked DVT  Next appointment 6 to 8 weeks   Medication Adjustments/Labs and Tests Ordered: Current medicines are reviewed at length with the patient today.  Concerns regarding medicines are outlined above.  Orders Placed This Encounter  Procedures   CT CORONARY MORPH W/CTA COR W/SCORE W/CA W/CM &/OR WO/CM   Basic metabolic panel   EKG 14-GYJE   Meds ordered this encounter  Medications   metoprolol tartrate (LOPRESSOR) 25 MG tablet    Sig: Take 1 tablet (25 mg total) by mouth 2 (two) times daily.    Dispense:  180 tablet    Refill:  3     Chief Complaint  Patient presents with   Chest Pain  And exertional shortness of breath is interfering with his quality of life and not improving to the degree he expects with his bronchodilators  History of Present Illness:    [name] is a 65 y.o. male who is being  seen today for the evaluation of  worsened exertional shortness of breath at the request of [name], [name].  He has a history of obstructive sleep apnea COPD 35-pack-year history of cigarette smoking having stopped 2 years ago and a pulmonary nodule.  He experienced lower extremity deep vein thrombosis October of this year treated with Xarelto felt to be provoked by prolonged airline flight returning from [place]. Recent chest CT 06/09/2021 she has moderate to severe central lobular emphysema and three-vessel coronary artery calcification.  Previously seen by [name] in heart care   09/03/2018 for chest pain and coronary artery calcification on CT scan.  He is advised to myocardial perfusion study does not appear to have been performed and initiate lipid-lowering therapy and follow-up after testing.   He follows closely with his pulmonologist who is concerned that his severe limiting shortness of breath is a reflection of heart disease.  When he does activities like golfing he will have to stop and rest when he walks from the golf cart over to the ball and if he walks his driveway uphill 992 feet he develops severe or limiting shortness of breath has to stop and rest and recover quite anginal equivalent in nature.  At times he gets variable to chest discomfort not with exertion.  No edema orthopnea palpitation or syncope.  He has no known heart disease.  Past Medical History:  Diagnosis Date   Bilateral shoulder bursitis    Chronic back pain    COPD (chronic obstructive pulmonary disease) with emphysema ([name])    DDD (degenerative disc disease), lumbar    hx epideral injection  L5--S1    Diverticulitis    DVT (deep venous thrombosis) ([place]) 11/19/2018   Dysphasia    intermittant w/ food    Dyspnea    Dyspnea on exertion    GERD (gastroesophageal reflux disease)    Gout    L great toe   Heterozygous factor V Leiden mutation ([place])    Hiatal hernia    Hip problem 2020   left, seeing orthopedics   History of acute pancreatitis    alcoholic pancreatitis [id number] and 01-30-2012   History of colon polyps    10-17-2005  hyperplastic polyp's   History of esophageal stricture    s/p  dilation 02-02-2017   History of peptic ulcer 1990s   History of pneumococcal pneumonia 2006   bilateral pneumonia w/ left lower lobe abscess treated w/ chest tube with suction for drainage   History of pneumothorax    1984--  left spontaneous pneumothorax ,  treated w/ chest tube   Malignant neoplasm of prostate [facility]) urologist-  [name]/  oncologist -- [name]   dx 12-02-2016--  Stage T2b, Gleason 3+4,  PSA 5.99,  vol 25cc   Numbness of left foot    outer left ankle numb-- per pt has appt. w/ neurologist   OSA (obstructive sleep apnea)    per pt has not used cpap over year ago from 04-16-2017--- per study 01-17-2010  severe osa   Solid nodule of lung greater than 8 mm in diameter 05/11/2018   03/2018: RUL Nodule, 17mm   Wears glasses     Past Surgical History:  Procedure Laterality Date   BIOPSY  02/02/2017   Procedure: BIOPSY;  Surgeon: Daneil Dolin, MD;  [facility];  Service: Endoscopy;;  duodenal bx's, esophgeal bx's   BIOPSY  07/30/2017   Procedure: BIOPSY;  Surgeon: Daneil Dolin, MD;  [facility];  Service: Endoscopy;;  esophagus   BIOPSY  02/15/2021   Procedure: BIOPSY;  Surgeon: Daneil Dolin, MD;  [facility];  Service: Endoscopy;;   CARDIOVASCULAR STRESS TEST  09/12/2009   normal nuclear perfusion study w/ no ischemia /  normal LV function and wall motion , ef 57%   COLONOSCOPY  2007   Joe. Gala Romney: hyperplastic polyps, internal hemorrhoids    COLONOSCOPY WITH PROPOFOL N/A 05/03/2018   Joe. Gala Romney: diverticulosis in entire colon, multiple polyps in sigmoid, at splenic flexure, and ascending colon, not all polyps removed. Tubular adenomas and inflammatory polyps. Needs 3 year surveilance   COLONOSCOPY WITH PROPOFOL N/A 02/15/2021   Procedure: COLONOSCOPY WITH PROPOFOL;  Surgeon: Daneil Dolin, MD;  [facility];  Service: Endoscopy;  Laterality: N/A;  2:30pm   EPIDURAL BLOCK INJECTION  2020   ESOPHAGOGASTRODUODENOSCOPY (EGD) WITH PROPOFOL N/A 02/02/2017   Joe. Gala Romney: esophageal stenosis s/p dilation and biopsy, medium sized hiatal hernia, normal duodenum   ESOPHAGOGASTRODUODENOSCOPY (EGD) WITH PROPOFOL N/A 07/30/2017   Joe. Gala Romney: esophageal stenosis s/p dilation and biopsy, medium-sized hiatal hernia, normal duodenum   HEMORRHOID SURGERY  01-21-2008    [facility]   IR RADIOLOGIST EVAL & MGMT  08/29/2020    MALONEY DILATION N/A 02/02/2017   Procedure: Venia Minks DILATION;  Surgeon: Daneil Dolin, MD;  [facility];  Service: Endoscopy;  Laterality: N/A;   MALONEY DILATION N/A 07/30/2017   Procedure: Venia Minks DILATION;  Surgeon: Daneil Dolin, MD;  [facility];  Service: Endoscopy;  Laterality: N/A;   POLYPECTOMY  05/03/2018   Procedure: POLYPECTOMY;  Surgeon: Daneil Dolin, MD;  [facility];  Service: Endoscopy;;  ascending colon polyp, sigmoid colon polyps (multiple)   POLYPECTOMY  02/15/2021   Procedure: POLYPECTOMY;  Surgeon: Daneil Dolin, MD;  [facility];  Service: Endoscopy;;   RADIOACTIVE SEED IMPLANT N/A 04/23/2017   Procedure: RADIOACTIVE SEED IMPLANT/BRACHYTHERAPY IMPLANT;  Surgeon: Franchot Gallo, MD;  Location: [facility];  Service: Urology;  Laterality: N/A;   SHOULDER ARTHROSCOPY  08/05/2003   SPACE OAR INSTILLATION N/A 04/23/2017   Procedure: SPACE OAR INSTILLATION;  Surgeon: Franchot Gallo, MD;  Location[facility];  Service: Urology;  Laterality: N/A;   TRANSTHORACIC ECHOCARDIOGRAM  03/28/2009   mild LVH, ef 55-60%/ mild aorta calcification   VASECTOMY  08/05/1983   VIDEO ASSISTED THORACOSCOPY (VATS)/DECORTICATION Left     Current Medications: Current Meds  Medication Sig   acetaminophen (TYLENOL) 500 MG tablet Take 2 tablets (1,000 mg total) by mouth every 6 (six) hours as needed for mild pain.   albuterol (PROVENTIL) (2.5 MG/3ML) 0.083% nebulizer solution Take 2.5 mg by nebulization every 6 (six) hours as needed for wheezing or shortness of breath.   albuterol (VENTOLIN HFA) 108 (90 Base) MCG/ACT inhaler Inhale 2 puffs into the lungs every 6 (six) hours as needed for wheezing or shortness of breath.   allopurinol (ZYLOPRIM) 300 MG tablet Take 300 mg by mouth daily.   Artificial Tear Solution (SOOTHE XP OP) Place 1 vial into both eyes in the morning and at bedtime.   aspirin EC 81 MG  tablet Take 81 mg by mouth daily. Swallow whole.   Budeson-Glycopyrrol-Formoterol (BREZTRI AEROSPHERE) 160-9-4.8 MCG/ACT AERO Inhale 2 puffs into the lungs in the morning and at bedtime.   doxycycline (VIBRAMYCIN) 50 MG capsule Take 50 mg by mouth daily.   Fluticasone-Umeclidin-Vilant (TRELEGY ELLIPTA) 100-62.5-25 MCG/ACT AEPB Inhale  1 puff into the lungs daily.   metoprolol tartrate (LOPRESSOR) 25 MG tablet Take 1 tablet (25 mg total) by mouth 2 (two) times daily.   Multiple Vitamin (MULTIVITAMIN WITH MINERALS) TABS tablet Take 1 tablet by mouth daily.   psyllium (METAMUCIL SMOOTH TEXTURE) 28 % packet Take 1 packet by mouth daily.     Allergies:   Patient has no known allergies.   Social History   Socioeconomic History   Marital status: Married    Spouse name: Not on file   Number of children: 2   Years of education: Not on file   Highest education level: Bachelor's degree (e.g., BA, AB, BS)  Occupational History   Not on file  Tobacco Use   Smoking status: Former    Packs/day: 1.00    Years: 30.00    Pack years: 30.00    Types: Cigarettes, E-cigarettes    Quit date: 10/03/2015    Years since quitting: 5.7   Smokeless tobacco: Never   Tobacco comments:    stoped vaping 2018  Vaping Use   Vaping Use: Former  Substance and Sexual Activity   Alcohol use: Yes    Alcohol/week: 5.0 standard drinks    Types: 5 Glasses of wine per week   Drug use: No   Sexual activity: Yes    Birth control/protection: None  Other Topics Concern   Not on file  Social History Narrative   Lives at home with his wife   Right handed   Social Determinants of Health   Financial Resource Strain: Not on file  Food Insecurity: Not on file  Transportation Needs: Not on file  Physical Activity: Not on file  Stress: Not on file  Social Connections: Not on file     Family History: The patient's family history includes Diabetes in his mother; Heart disease in his brother and father. There is no  history of Cancer, Colon cancer, Colon polyps, or Neuropathy.  ROS:   ROS Please see the history of present illness.     All other systems reviewed and are negative.  EKGs/Labs/Other Studies Reviewed:    The following studies were reviewed today:   EKG:  EKG is  ordered today.  The ekg ordered today is personally reviewed and demonstrates sinus rhythm and is normal  Recent Labs: 11/14/2020: ALT 17; BUN 13; Creatinine, Ser 0.85; Hemoglobin 13.5; Platelets 242; Potassium 4.3; Sodium 138  Recent Lipid Panel    Component Value Date/Time   CHOL 180 11/13/2011 0857   TRIG 95 11/13/2011 0857   HDL 62 11/13/2011 0857   CHOLHDL 2.9 11/13/2011 0857   VLDL 19 11/13/2011 0857   LDLCALC 99 11/13/2011 0857    Physical Exam:    VS:  BP (!) 158/102 (BP Location: Right Arm)    Pulse 93    Ht 6\' 1"  (1.854 m)    Wt 235 lb 1.3 oz (106.6 kg)    SpO2 97%    BMI 31.02 kg/m     Wt Readings from Last 3 Encounters:  07/17/21 235 lb 1.3 oz (106.6 kg)  07/05/21 237 lb 9.6 oz (107.8 kg)  05/30/21 240 lb 9.6 oz (109.1 kg)     GEN: He appears healthy well nourished, well developed in no acute distress HEENT: Normal NECK: No JVD; No carotid bruits LYMPHATICS: No lymphadenopathy CARDIAC: Distant heart sounds RRR, no murmurs, rubs, gallops RESPIRATORY:  Clear to auscultation without rales, wheezing or rhonchi  ABDOMEN: Soft, non-tender, non-distended MUSCULOSKELETAL:  No edema; No  deformity  SKIN: Warm and dry NEUROLOGIC:  Alert and oriented x 3 PSYCHIATRIC:  Normal affect     Signed, Joe More, MD  07/17/2021 3:38 PM    [facility]

## 2021-07-16 NOTE — Progress Notes (Addendum)
Cardiology Office Note:    Date:  07/17/2021   ID:  [name], DOB 22-Oct-1955, MRN [id number]  PCP:  Joe Evens, MD  Cardiologist:  Joe More, MD   Referring MD: Joe Nash, DO  ASSESSMENT:    1. Precordial pain   2. SOB (shortness of breath) on exertion   3. Coronary artery calcification seen on CT scan   4. Centrilobular emphysema ([place])   5. Deep vein thrombosis (DVT) of popliteal vein of right lower extremity, unspecified chronicity ([name])    PLAN:       Joe Priest, MD Physician Cardiology Telephone Encounter Signed Encounter Date:  07/25/2021   Signed               I contacted [name] and we reviewed the results of his cardiac CTA   He is severely limited with anginal equivalent shortness of breath out of proportion to his lung disease is a very high calcium score 2641 99th percentile for age and sex and he has significant CAD with severe stenosis left anterior descending coronary artery proximal.   Advised to undergo coronary angiography with high risk CTA options benefits risks detailed and he agrees.   Would like to do it before the first of the year if possible we will see what is available tomorrow we will initiate high intensity statin and will leave him on a low-dose beta-blocker.        Electronically signed by Joe Priest, MD at 07/25/2021  4:26 PM  In order of problems listed above:  Although he has significant COPD he also has coronary artery calcification and symptoms that may well be anginal equivalent exertional shortness of breath severe and limiting as well as non anginal chest pain.  He is quite interested in improving the quality of his life disappointed he has not improved [name] with his bronchodilators and after discussion wants to undergo an ischemia evaluation.  I think the only 2 modalities that would work for him would either be direct referral for left heart catheterization or cardiac CTA.  He prefers and I  recommend a cardiac CTA unguinal put him on a beta-blocker to control his heart rate get good quality images and make a decision afterwards about starting statin therapy.  If he has severe flow-limiting stenosis he would benefit from revascularization in view of his lung disease preferably percutaneously.  If he does not have flow-limiting stenosis he may benefit from other modalities like pulmonary rehab. Stable COPD managed by his pulmonologist Stable previously anticoagulated for provoked DVT  Next appointment 6 to 8 weeks   Medication Adjustments/Labs and Tests Ordered: Current medicines are reviewed at length with the patient today.  Concerns regarding medicines are outlined above.  Orders Placed This Encounter  Procedures   CT CORONARY MORPH W/CTA COR W/SCORE W/CA W/CM &/OR WO/CM   Basic metabolic panel   EKG 63-JSHF   Meds ordered this encounter  Medications   metoprolol tartrate (LOPRESSOR) 25 MG tablet    Sig: Take 1 tablet (25 mg total) by mouth 2 (two) times daily.    Dispense:  180 tablet    Refill:  3     Chief Complaint  Patient presents with   Chest Pain  And exertional shortness of breath is interfering with his quality of life and not improving to the degree he expects with his bronchodilators  History of Present Illness:    [name] is a 65 y.o. male who is being  seen today for the evaluation of  worsened exertional shortness of breath at the request of Icard, Bradley L, DO.  He has a history of obstructive sleep apnea COPD 35-pack-year history of cigarette smoking having stopped 2 years ago and a pulmonary nodule.  He experienced lower extremity deep vein thrombosis October of this year treated with Xarelto felt to be provoked by prolonged airline flight returning from [place]. Recent chest CT 06/09/2021 she has moderate to severe central lobular emphysema and three-vessel coronary artery calcification.  Previously seen by Dr. Harl Bowie in heart care   09/03/2018 for chest pain and coronary artery calcification on CT scan.  He is advised to myocardial perfusion study does not appear to have been performed and initiate lipid-lowering therapy and follow-up after testing.   He follows closely with his pulmonologist who is concerned that his severe limiting shortness of breath is a reflection of heart disease.  When he does activities like golfing he will have to stop and rest when he walks from the golf cart over to the ball and if he walks his driveway uphill 378 feet he develops severe or limiting shortness of breath has to stop and rest and recover quite anginal equivalent in nature.  At times he gets variable to chest discomfort not with exertion.  No edema orthopnea palpitation or syncope.  He has no known heart disease.  Past Medical History:  Diagnosis Date   Bilateral shoulder bursitis    Chronic back pain    COPD (chronic obstructive pulmonary disease) with emphysema ([name])    DDD (degenerative disc disease), lumbar    hx epideral injection  L5--S1    Diverticulitis    DVT (deep venous thrombosis) ([place]) 11/19/2018   Dysphasia    intermittant w/ food    Dyspnea    Dyspnea on exertion    GERD (gastroesophageal reflux disease)    Gout    L great toe   Heterozygous factor V Leiden mutation ([place])    Hiatal hernia    Hip problem 2020   left, seeing orthopedics   History of acute pancreatitis    alcoholic pancreatitis [id number] and 01-30-2012   History of colon polyps    10-17-2005  hyperplastic polyp's   History of esophageal stricture    s/p  dilation 02-02-2017   History of peptic ulcer 1990s   History of pneumococcal pneumonia 2006   bilateral pneumonia w/ left lower lobe abscess treated w/ chest tube with suction for drainage   History of pneumothorax    1984--  left spontaneous pneumothorax ,  treated w/ chest tube   Malignant neoplasm of prostate [facility]) urologist-  dr dahlstedt/  oncologist -- dr Tammi Klippel   dx 12-02-2016--  Stage T2b, Gleason 3+4,  PSA 5.99,  vol 25cc   Numbness of left foot    outer left ankle numb-- per pt has appt. w/ neurologist   OSA (obstructive sleep apnea)    per pt has not used cpap over year ago from 04-16-2017--- per study 01-17-2010  severe osa   Solid nodule of lung greater than 8 mm in diameter 05/11/2018   03/2018: RUL Nodule, 52mm   Wears glasses     Past Surgical History:  Procedure Laterality Date   BIOPSY  02/02/2017   Procedure: BIOPSY;  Surgeon: Daneil Dolin, MD;  [facility];  Service: Endoscopy;;  duodenal bx's, esophgeal bx's   BIOPSY  07/30/2017   Procedure: BIOPSY;  Surgeon: Daneil Dolin, MD;  [facility];  Service: Endoscopy;;  esophagus   BIOPSY  02/15/2021   Procedure: BIOPSY;  Surgeon: Daneil Dolin, MD;  [facility];  Service: Endoscopy;;   CARDIOVASCULAR STRESS TEST  09/12/2009   normal nuclear perfusion study w/ no ischemia /  normal LV function and wall motion , ef 57%   COLONOSCOPY  2007   Dr. Gala Romney: hyperplastic polyps, internal hemorrhoids    COLONOSCOPY WITH PROPOFOL N/A 05/03/2018   Dr. Gala Romney: diverticulosis in entire colon, multiple polyps in sigmoid, at splenic flexure, and ascending colon, not all polyps removed. Tubular adenomas and inflammatory polyps. Needs 3 year surveilance   COLONOSCOPY WITH PROPOFOL N/A 02/15/2021   Procedure: COLONOSCOPY WITH PROPOFOL;  Surgeon: Daneil Dolin, MD;  [facility];  Service: Endoscopy;  Laterality: N/A;  2:30pm   EPIDURAL BLOCK INJECTION  2020   ESOPHAGOGASTRODUODENOSCOPY (EGD) WITH PROPOFOL N/A 02/02/2017   Dr. Gala Romney: esophageal stenosis s/p dilation and biopsy, medium sized hiatal hernia, normal duodenum   ESOPHAGOGASTRODUODENOSCOPY (EGD) WITH PROPOFOL N/A 07/30/2017   Dr. Gala Romney: esophageal stenosis s/p dilation and biopsy, medium-sized hiatal hernia, normal duodenum   HEMORRHOID SURGERY  01-21-2008    [facility]   IR RADIOLOGIST EVAL & MGMT  08/29/2020    MALONEY DILATION N/A 02/02/2017   Procedure: Venia Minks DILATION;  Surgeon: Daneil Dolin, MD;  [facility];  Service: Endoscopy;  Laterality: N/A;   MALONEY DILATION N/A 07/30/2017   Procedure: Venia Minks DILATION;  Surgeon: Daneil Dolin, MD;  [facility];  Service: Endoscopy;  Laterality: N/A;   POLYPECTOMY  05/03/2018   Procedure: POLYPECTOMY;  Surgeon: Daneil Dolin, MD;  [facility];  Service: Endoscopy;;  ascending colon polyp, sigmoid colon polyps (multiple)   POLYPECTOMY  02/15/2021   Procedure: POLYPECTOMY;  Surgeon: Daneil Dolin, MD;  [facility];  Service: Endoscopy;;   RADIOACTIVE SEED IMPLANT N/A 04/23/2017   Procedure: RADIOACTIVE SEED IMPLANT/BRACHYTHERAPY IMPLANT;  Surgeon: Franchot Gallo, MD;  Location: [facility];  Service: Urology;  Laterality: N/A;   SHOULDER ARTHROSCOPY  08/05/2003   SPACE OAR INSTILLATION N/A 04/23/2017   Procedure: SPACE OAR INSTILLATION;  Surgeon: Franchot Gallo, MD;  Location: [facility];  Service: Urology;  Laterality: N/A;   TRANSTHORACIC ECHOCARDIOGRAM  03/28/2009   mild LVH, ef 55-60%/ mild aorta calcification   VASECTOMY  08/05/1983   VIDEO ASSISTED THORACOSCOPY (VATS)/DECORTICATION Left     Current Medications: Current Meds  Medication Sig   acetaminophen (TYLENOL) 500 MG tablet Take 2 tablets (1,000 mg total) by mouth every 6 (six) hours as needed for mild pain.   albuterol (PROVENTIL) (2.5 MG/3ML) 0.083% nebulizer solution Take 2.5 mg by nebulization every 6 (six) hours as needed for wheezing or shortness of breath.   albuterol (VENTOLIN HFA) 108 (90 Base) MCG/ACT inhaler Inhale 2 puffs into the lungs every 6 (six) hours as needed for wheezing or shortness of breath.   allopurinol (ZYLOPRIM) 300 MG tablet Take 300 mg by mouth daily.   Artificial Tear Solution (SOOTHE XP OP) Place 1 vial into both eyes in the morning and at bedtime.   aspirin EC 81 MG  tablet Take 81 mg by mouth daily. Swallow whole.   Budeson-Glycopyrrol-Formoterol (BREZTRI AEROSPHERE) 160-9-4.8 MCG/ACT AERO Inhale 2 puffs into the lungs in the morning and at bedtime.   doxycycline (VIBRAMYCIN) 50 MG capsule Take 50 mg by mouth daily.   Fluticasone-Umeclidin-Vilant (TRELEGY ELLIPTA) 100-62.5-25 MCG/ACT AEPB Inhale  1 puff into the lungs daily.   metoprolol tartrate (LOPRESSOR) 25 MG tablet Take 1 tablet (25 mg total) by mouth 2 (two) times daily.   Multiple Vitamin (MULTIVITAMIN WITH MINERALS) TABS tablet Take 1 tablet by mouth daily.   psyllium (METAMUCIL SMOOTH TEXTURE) 28 % packet Take 1 packet by mouth daily.     Allergies:   Patient has no known allergies.   Social History   Socioeconomic History   Marital status: Married    Spouse name: Not on file   Number of children: 2   Years of education: Not on file   Highest education level: Bachelor's degree (e.g., BA, AB, BS)  Occupational History   Not on file  Tobacco Use   Smoking status: Former    Packs/day: 1.00    Years: 30.00    Pack years: 30.00    Types: Cigarettes, E-cigarettes    Quit date: 10/03/2015    Years since quitting: 5.7   Smokeless tobacco: Never   Tobacco comments:    stoped vaping 2018  Vaping Use   Vaping Use: Former  Substance and Sexual Activity   Alcohol use: Yes    Alcohol/week: 5.0 standard drinks    Types: 5 Glasses of wine per week   Drug use: No   Sexual activity: Yes    Birth control/protection: None  Other Topics Concern   Not on file  Social History Narrative   Lives at home with his wife   Right handed   Social Determinants of Health   Financial Resource Strain: Not on file  Food Insecurity: Not on file  Transportation Needs: Not on file  Physical Activity: Not on file  Stress: Not on file  Social Connections: Not on file     Family History: The patient's family history includes Diabetes in his mother; Heart disease in his brother and father. There is no  history of Cancer, Colon cancer, Colon polyps, or Neuropathy.  ROS:   ROS Please see the history of present illness.     All other systems reviewed and are negative.  EKGs/Labs/Other Studies Reviewed:    The following studies were reviewed today:   EKG:  EKG is  ordered today.  The ekg ordered today is personally reviewed and demonstrates sinus rhythm and is normal  Recent Labs: 11/14/2020: ALT 17; BUN 13; Creatinine, Ser 0.85; Hemoglobin 13.5; Platelets 242; Potassium 4.3; Sodium 138  Recent Lipid Panel    Component Value Date/Time   CHOL 180 11/13/2011 0857   TRIG 95 11/13/2011 0857   HDL 62 11/13/2011 0857   CHOLHDL 2.9 11/13/2011 0857   VLDL 19 11/13/2011 0857   LDLCALC 99 11/13/2011 0857    Physical Exam:    VS:  BP (!) 158/102 (BP Location: Right Arm)    Pulse 93    Ht 6\' 1"  (1.854 m)    Wt 235 lb 1.3 oz (106.6 kg)    SpO2 97%    BMI 31.02 kg/m     Wt Readings from Last 3 Encounters:  07/17/21 235 lb 1.3 oz (106.6 kg)  07/05/21 237 lb 9.6 oz (107.8 kg)  05/30/21 240 lb 9.6 oz (109.1 kg)     GEN: He appears healthy well nourished, well developed in no acute distress HEENT: Normal NECK: No JVD; No carotid bruits LYMPHATICS: No lymphadenopathy CARDIAC: Distant heart sounds RRR, no murmurs, rubs, gallops RESPIRATORY:  Clear to auscultation without rales, wheezing or rhonchi  ABDOMEN: Soft, non-tender, non-distended MUSCULOSKELETAL:  No edema; No  deformity  SKIN: Warm and dry NEUROLOGIC:  Alert and oriented x 3 PSYCHIATRIC:  Normal affect     Signed, Joe More, MD  07/17/2021 3:38 PM    [facility]

## 2021-07-17 ENCOUNTER — Other Ambulatory Visit: Payer: Self-pay

## 2021-07-17 ENCOUNTER — Ambulatory Visit (INDEPENDENT_AMBULATORY_CARE_PROVIDER_SITE_OTHER): Payer: 59 | Admitting: Cardiology

## 2021-07-17 ENCOUNTER — Telehealth: Payer: Self-pay | Admitting: Cardiology

## 2021-07-17 ENCOUNTER — Encounter: Payer: Self-pay | Admitting: Cardiology

## 2021-07-17 VITALS — BP 158/102 | HR 93 | Ht 73.0 in | Wt 235.1 lb

## 2021-07-17 DIAGNOSIS — I2510 Atherosclerotic heart disease of native coronary artery without angina pectoris: Secondary | ICD-10-CM | POA: Diagnosis not present

## 2021-07-17 DIAGNOSIS — R072 Precordial pain: Secondary | ICD-10-CM

## 2021-07-17 DIAGNOSIS — R0602 Shortness of breath: Secondary | ICD-10-CM

## 2021-07-17 DIAGNOSIS — I82431 Acute embolism and thrombosis of right popliteal vein: Secondary | ICD-10-CM

## 2021-07-17 DIAGNOSIS — J432 Centrilobular emphysema: Secondary | ICD-10-CM | POA: Diagnosis not present

## 2021-07-17 MED ORDER — METOPROLOL TARTRATE 25 MG PO TABS: 25.0000 mg | ORAL_TABLET | Freq: Two times a day (BID) | ORAL | 3 refills | Status: DC

## 2021-07-17 NOTE — Telephone Encounter (Signed)
Pt was in to see [facility] today, pt was advised to return to the [facility] office for labwork 1 wk before Cardiac CT. Pt wants to know if he needs to come back to the office or can he have the lab work drawn somewhere else?

## 2021-07-17 NOTE — Telephone Encounter (Signed)
Spoke to patient just now and let him know that these do need to be done here in our office. He verbalizes understanding.

## 2021-07-17 NOTE — Patient Instructions (Signed)
Medication Instructions:  Your physician has recommended you make the following change in your medication:  START: Lopressor 25 mg take one tablet by mouth twice daily.  *If you need a refill on your cardiac medications before your next appointment, please call your pharmacy*   Lab Work: Your physician recommends that you return for lab work in: [facility] CT  BMP   Testing/Procedures:   Your cardiac CT will be scheduled at the below location:   [facility] [address] [place], [place] [address] [phone]  If scheduled at [facility], please arrive at the [facility] main entrance (entrance A) of [facility] 30 minutes prior to test start time. You can use the FREE valet parking offered at the main entrance (encouraged to control the heart rate for the test) Proceed to the [facility] Radiology Department (first floor) to check-in and test prep.  Please follow these instructions carefully (unless otherwise directed):  On the Night Before the Test: Be sure to Drink plenty of water. Do not consume any caffeinated/decaffeinated beverages or chocolate 12 hours prior to your test. Do not take any antihistamines 12 hours prior to your test.  On the Day of the Test: Drink plenty of water until 1 hour prior to the test. Do not eat any food 4 hours prior to the test. You may take your regular medications prior to the test.  Take metoprolol (Lopressor) two hours prior to test.       After the Test: Drink plenty of water. After receiving IV contrast, you may experience a mild flushed feeling. This is normal. On occasion, you may experience a mild rash up to 24 hours after the test. This is not dangerous. If this occurs, you can take Benadryl 25 mg and increase your fluid intake. If you experience trouble breathing, this can be serious. If it is severe call 911 IMMEDIATELY. If it is mild, please call our office. If you take any  of these medications: Glipizide/Metformin, Avandament, Glucavance, please do not take 48 hours after completing test unless otherwise instructed.  Please allow 2-4 weeks for scheduling of routine cardiac CTs. Some insurance companies require a pre-authorization which may delay scheduling of this test.   For non-scheduling related questions, please contact the cardiac imaging nurse navigator should you have any questions/concerns: Marchia Bond, Cardiac Imaging Nurse Navigator Gordy Clement, Cardiac Imaging Nurse Navigator [facility] Direct Office Dial: [phone]   For scheduling needs, including cancellations and rescheduling, please call [place], [phone].  If you have labs (blood work) drawn today and your tests are completely normal, you will receive your results only by: [place] (if you have MyChart) OR A paper copy in the mail If you have any lab test that is abnormal or we need to change your treatment, we will call you to review the results.   Follow-Up: At [facility], you and your health needs are our priority.  As part of our continuing mission to provide you with exceptional heart care, we have created designated Provider Care Teams.  These Care Teams include your primary Cardiologist (physician) and Advanced Practice Providers (APPs -  Physician Assistants and Nurse Practitioners) who all work together to provide you with the care you need, when you need it.  We recommend signing up for the patient portal called "MyChart".  Sign up information is provided on this After Visit Summary.  MyChart is used to connect with  patients for Virtual Visits (Telemedicine).  Patients are able to view lab/test results, encounter notes, upcoming appointments, etc.  Non-urgent messages can be sent to your provider as well.   To learn more about what you can do with MyChart, go to [email or web address].    Your next appointment:   8 week(s)  The  format for your next appointment:   In Person  Provider:   Shirlee More, MD    Other Instructions

## 2021-07-18 ENCOUNTER — Other Ambulatory Visit: Payer: Self-pay

## 2021-07-18 DIAGNOSIS — R072 Precordial pain: Secondary | ICD-10-CM

## 2021-07-19 ENCOUNTER — Telehealth: Payer: Self-pay

## 2021-07-19 LAB — BASIC METABOLIC PANEL
BUN/Creatinine Ratio: 15 (ref 10–24)
BUN: 13 mg/dL (ref 8–27)
CO2: 22 mmol/L (ref 20–29)
Calcium: 8.9 mg/dL (ref 8.6–10.2)
Chloride: 101 mmol/L (ref 96–106)
Creatinine, Ser: 0.87 mg/dL (ref 0.76–1.27)
Glucose: 137 mg/dL — ABNORMAL HIGH (ref 70–99)
Potassium: 4.1 mmol/L (ref 3.5–5.2)
Sodium: 140 mmol/L (ref 134–144)
eGFR: 96 mL/min/{1.73_m2} (ref >59)

## 2021-07-19 NOTE — Telephone Encounter (Signed)
Spoke with patient regarding results and recommendation.  Patient verbalizes understanding and is agreeable to plan of care. Advised patient to call back with any issues or concerns.  

## 2021-07-19 NOTE — Telephone Encounter (Signed)
-----   Message from Richardo Priest, MD sent at 07/19/2021  6:58 AM EST ----- Normal or stable result

## 2021-07-23 ENCOUNTER — Telehealth (HOSPITAL_COMMUNITY): Payer: Self-pay | Admitting: *Deleted

## 2021-07-23 NOTE — Telephone Encounter (Signed)
Reaching out to patient to offer assistance regarding upcoming cardiac imaging study; pt verbalizes understanding of appt date/time, parking situation and where to check in, pre-test NPO status and medications ordered, and verified current allergies; name and call back number provided for further questions should they arise  [facility] [place] [facility] [phone] office [phone] cell  Patient's HR over the phone was 61 but has an office visit HR of 93. He is to take 50mg  metoprolol tartrate two hours prior to cardiac CT scan. He is aware to arrive at [place], for his 8:30am scan.

## 2021-07-24 ENCOUNTER — Other Ambulatory Visit: Payer: Self-pay

## 2021-07-24 ENCOUNTER — Ambulatory Visit (HOSPITAL_COMMUNITY)
Admission: RE | Admit: 2021-07-24 | Discharge: 2021-07-24 | Disposition: A | Discharge status: Home / Self Care | Payer: 59 | Source: Ambulatory Visit | Attending: Cardiology | Admitting: Cardiology

## 2021-07-24 ENCOUNTER — Telehealth: Payer: Self-pay | Admitting: Cardiology

## 2021-07-24 DIAGNOSIS — R072 Precordial pain: Secondary | ICD-10-CM | POA: Insufficient documentation

## 2021-07-24 DIAGNOSIS — I2510 Atherosclerotic heart disease of native coronary artery without angina pectoris: Secondary | ICD-10-CM

## 2021-07-24 MED ORDER — NITROGLYCERIN 0.4 MG SL SUBL
0.8000 mg | SUBLINGUAL_TABLET | Freq: Once | SUBLINGUAL | Status: AC
  Administered 2021-07-24: 09:00:00 0.8 mg via SUBLINGUAL

## 2021-07-24 MED ORDER — NITROGLYCERIN 0.4 MG SL SUBL
SUBLINGUAL_TABLET | SUBLINGUAL | Status: AC
  Filled 2021-07-24: qty 2

## 2021-07-24 MED ORDER — IOHEXOL 350 MG/ML SOLN
100.0000 mL | Freq: Once | INTRAVENOUS | Status: AC | PRN
  Administered 2021-07-24: 09:00:00 100 mL via INTRAVENOUS

## 2021-07-24 NOTE — Telephone Encounter (Signed)
Spoke to the patient just now and let him know that Dr. Bettina Gavia said to take his metoprolol as prescribed. He is going to take his blood pressure twice daily an hour after taking his medications and will contact us with these readings next week to show Dr. Bettina Gavia.    Encouraged patient to call back with any questions or concerns.

## 2021-07-24 NOTE — Telephone Encounter (Signed)
Pt c/o BP issue: STAT if pt c/o blurred vision, one-sided weakness or slurred speech  1. What are your last 5 BP readings?  185/110 164/103 168/108 PULSE 58   2. Are you having any other symptoms (ex. Dizziness, headache, blurred vision, passed out)? HEADACHE VOMITING  3. What is your BP issue?  PT HAD A CARDIAC CT SCAN THIS MORNING WITH CONTRAST, PT WAS GIVEN 2 NITRO'S AND 2 METOPROLOL THIS MORNING. PT WANTS TO KNOW IF IT'S OKAY TO TAKE 1-2 MORE METROPOLOL TABLETS

## 2021-07-25 ENCOUNTER — Telehealth: Payer: Self-pay | Admitting: Cardiology

## 2021-07-25 NOTE — Telephone Encounter (Signed)
I contacted [name] and we reviewed the results of his cardiac CTA  He is severely limited with anginal equivalent shortness of breath out of proportion to his lung disease is a very high calcium score 2641 99th percentile for age and sex and he has significant CAD with severe stenosis left anterior descending coronary artery proximal.  Advised to undergo coronary angiography with high risk CTA options benefits risks detailed and he agrees.  Would like to do it before the first of the year if possible we will see what is available tomorrow we will initiate high intensity statin and will leave him on a low-dose beta-blocker.

## 2021-07-26 ENCOUNTER — Telehealth: Payer: Self-pay

## 2021-07-26 DIAGNOSIS — R079 Chest pain, unspecified: Secondary | ICD-10-CM

## 2021-07-26 MED ORDER — METOPROLOL SUCCINATE ER 25 MG PO TB24: 25.0000 mg | ORAL_TABLET | Freq: Every day | ORAL | 3 refills | Status: DC

## 2021-07-26 MED ORDER — ROSUVASTATIN CALCIUM 20 MG PO TABS: 20.0000 mg | ORAL_TABLET | Freq: Every day | ORAL | 3 refills | Status: DC

## 2021-07-26 NOTE — Addendum Note (Signed)
Addended by: Shirlee More on: 07/26/2021 11:39 AM   Modules accepted: Orders

## 2021-07-26 NOTE — Telephone Encounter (Signed)
-----   Message from Joe Priest, MD sent at 07/25/2021  4:27 PM EST ----- Lilia Pro I phoned him at home reviewed the results of his CTA feel comfortable I have obtained informed consent he like to go ahead and do heart catheterization possible PCI and he like to do it before the procedure possible I told him I would not know but I would like him to start taking high intensity statin rosuvastatin 20 mg daily with his very high coronary calcium score and also placed on Toprol-XL 25 mg daily a prescription for 30 pending the procedure.  I told him we will give him a call tomorrow.

## 2021-07-26 NOTE — Telephone Encounter (Signed)
Spoke with patient regarding results and recommendation.  Patient verbalizes understanding and is agreeable to plan of care. Advised patient to call back with any issues or concerns.    The following instructions were reviewed with the patient and he verbalizes understanding:    [place] [address] Dept: [phone] Loc: [place]  07/26/2021  You are scheduled for a Cardiac Catheterization on Thursday, December 29 with Dr. Lauree Chandler.  1. Please arrive at the [facility] (Main Entrance A) at [facility]: [address] [place], [place] [address] at 5:30 AM (This time is two hours before your procedure to ensure your preparation). Free valet parking service is available.   Special note: Every effort is made to have your procedure done on time. Please understand that emergencies sometimes delay scheduled procedures.  2. Diet: Do not eat solid foods after midnight.  The patient may have clear liquids until 5am upon the day of the procedure.  3. Labs: YOU WILL NEED YOUR LABS DRAWN ON 07/29/21-07/31/21  4. Medication instructions in preparation for your procedure:   Contrast Allergy: No   On the morning of your procedure, take your Aspirin and any morning medicines NOT listed above.  You may use sips of water.  5. Plan for one night stay--bring personal belongings. 6. Bring a current list of your medications and current insurance cards. 7. You MUST have a responsible person to drive you home. 8. Someone MUST be with you the first 24 hours after you arrive home or your discharge will be delayed. 9. Please wear clothes that are easy to get on and off and wear slip-on shoes.  Thank you for allowing [place] to care for you!   -- [facility]

## 2021-07-31 ENCOUNTER — Telehealth: Payer: Self-pay

## 2021-07-31 LAB — BASIC METABOLIC PANEL
BUN/Creatinine Ratio: 13 (ref 10–24)
BUN: 12 mg/dL (ref 8–27)
CO2: 23 mmol/L (ref 20–29)
Calcium: 9.2 mg/dL (ref 8.6–10.2)
Chloride: 103 mmol/L (ref 96–106)
Creatinine, Ser: 0.89 mg/dL (ref 0.76–1.27)
Glucose: 89 mg/dL (ref 70–99)
Potassium: 4 mmol/L (ref 3.5–5.2)
Sodium: 142 mmol/L (ref 134–144)
eGFR: 96 mL/min/{1.73_m2} (ref >59)

## 2021-07-31 LAB — CBC
Hematocrit: 44.8 % (ref 37.5–51.0)
Hemoglobin: 15.6 g/dL (ref 13.0–17.7)
MCH: 35 pg — ABNORMAL HIGH (ref 26.6–33.0)
MCHC: 34.8 g/dL (ref 31.5–35.7)
MCV: 100 fL — ABNORMAL HIGH (ref 79–97)
Platelets: 162 10*3/uL (ref 150–450)
RBC: 4.46 x10E6/uL (ref 4.14–5.80)
RDW: 12.7 % (ref 11.6–15.4)
WBC: 8.9 10*3/uL (ref 3.4–10.8)

## 2021-07-31 NOTE — Telephone Encounter (Signed)
Called patient and reviewed the following pre-cath instructions with him. Patient has no questions at this time.  Cardiac catheterization scheduled at [facility] for: [facility] Main Entrance A [facility]) at: 0530  Diet-no solid food after midnight prior to cath, clear liquids until 5 AM day of procedure.  Medication instructions for procedure:  -Usual morning medications can be taken pre-cath with sips of water including aspirin 81 mg.    Confirmed patient has responsible adult to drive home post procedure and be with patient first 24 hours after arriving home.  [facility] does allow one visitor to accompany you and wait in the hospital waiting room while you are there for your procedure.  You and your visitor will be asked to wear a mask once you enter the hospital.  Patient reports does not currently have any new symptoms concerning for COVID-19 and no household members with COVID-19 like illness.

## 2021-08-01 ENCOUNTER — Other Ambulatory Visit: Payer: Self-pay

## 2021-08-01 ENCOUNTER — Encounter (HOSPITAL_COMMUNITY)
Admission: RE | Disposition: A | Discharge status: Home / Self Care | Payer: Self-pay | Source: Home / Self Care | Attending: Cardiovascular Disease

## 2021-08-01 ENCOUNTER — Encounter (HOSPITAL_COMMUNITY): Payer: Self-pay | Admitting: Cardiovascular Disease

## 2021-08-01 ENCOUNTER — Ambulatory Visit (HOSPITAL_COMMUNITY)
Admission: RE | Admit: 2021-08-01 | Discharge: 2021-08-01 | Disposition: A | Discharge status: Home / Self Care | Payer: 59 | Attending: Cardiovascular Disease | Admitting: Cardiovascular Disease

## 2021-08-01 DIAGNOSIS — I25118 Atherosclerotic heart disease of native coronary artery with other forms of angina pectoris: Secondary | ICD-10-CM | POA: Diagnosis present

## 2021-08-01 DIAGNOSIS — J449 Chronic obstructive pulmonary disease, unspecified: Secondary | ICD-10-CM | POA: Insufficient documentation

## 2021-08-01 DIAGNOSIS — G4733 Obstructive sleep apnea (adult) (pediatric): Secondary | ICD-10-CM | POA: Insufficient documentation

## 2021-08-01 DIAGNOSIS — I82431 Acute embolism and thrombosis of right popliteal vein: Secondary | ICD-10-CM | POA: Insufficient documentation

## 2021-08-01 DIAGNOSIS — R0602 Shortness of breath: Secondary | ICD-10-CM | POA: Diagnosis not present

## 2021-08-01 DIAGNOSIS — I2510 Atherosclerotic heart disease of native coronary artery without angina pectoris: Secondary | ICD-10-CM

## 2021-08-01 DIAGNOSIS — Z87891 Personal history of nicotine dependence: Secondary | ICD-10-CM | POA: Insufficient documentation

## 2021-08-01 DIAGNOSIS — I10 Essential (primary) hypertension: Secondary | ICD-10-CM | POA: Diagnosis not present

## 2021-08-01 DIAGNOSIS — R072 Precordial pain: Secondary | ICD-10-CM

## 2021-08-01 DIAGNOSIS — I25119 Atherosclerotic heart disease of native coronary artery with unspecified angina pectoris: Secondary | ICD-10-CM

## 2021-08-01 HISTORY — PX: CORONARY PRESSURE/FFR STUDY: CATH118243

## 2021-08-01 HISTORY — PX: LEFT HEART CATH AND CORONARY ANGIOGRAPHY: CATH118249

## 2021-08-01 HISTORY — PX: INTRAVASCULAR PRESSURE WIRE/FFR STUDY: CATH118243

## 2021-08-01 LAB — POCT ACTIVATED CLOTTING TIME: Activated Clotting Time: 245 seconds

## 2021-08-01 SURGERY — LEFT HEART CATH AND CORONARY ANGIOGRAPHY
Anesthesia: LOCAL

## 2021-08-01 MED ORDER — LABETALOL HCL 5 MG/ML IV SOLN: 10.0000 mg | INTRAVENOUS | Status: DC | PRN

## 2021-08-01 MED ORDER — ACETAMINOPHEN 325 MG PO TABS: 650.0000 mg | ORAL_TABLET | ORAL | Status: DC | PRN

## 2021-08-01 MED ORDER — SODIUM CHLORIDE 0.9 % IV SOLN: 250.0000 mL | INTRAVENOUS | Status: DC | PRN

## 2021-08-01 MED ORDER — LIDOCAINE HCL (PF) 1 % IJ SOLN
INTRAMUSCULAR | Status: DC | PRN
  Administered 2021-08-01 (×2): 4 mL

## 2021-08-01 MED ORDER — SODIUM CHLORIDE 0.9% FLUSH: 3.0000 mL | INTRAVENOUS | Status: DC | PRN

## 2021-08-01 MED ORDER — HEPARIN (PORCINE) IN NACL 1000-0.9 UT/500ML-% IV SOLN
INTRAVENOUS | Status: DC | PRN
  Administered 2021-08-01 (×2): 500 mL

## 2021-08-01 MED ORDER — SODIUM CHLORIDE 0.9 % IV SOLN: INTRAVENOUS | Status: AC

## 2021-08-01 MED ORDER — SODIUM CHLORIDE 0.9 % WEIGHT BASED INFUSION
3.0000 mL/kg/h | INTRAVENOUS | Status: AC
  Administered 2021-08-01: 06:00:00 3 mL/kg/h via INTRAVENOUS

## 2021-08-01 MED ORDER — ASPIRIN 81 MG PO CHEW: 81.0000 mg | CHEWABLE_TABLET | ORAL | Status: AC

## 2021-08-01 MED ORDER — HYDRALAZINE HCL 20 MG/ML IJ SOLN
INTRAMUSCULAR | Status: AC
  Filled 2021-08-01: qty 1

## 2021-08-01 MED ORDER — MIDAZOLAM HCL 2 MG/2ML IJ SOLN
INTRAMUSCULAR | Status: AC
  Filled 2021-08-01: qty 2

## 2021-08-01 MED ORDER — LIDOCAINE HCL (PF) 1 % IJ SOLN
INTRAMUSCULAR | Status: AC
  Filled 2021-08-01: qty 30

## 2021-08-01 MED ORDER — VERAPAMIL HCL 2.5 MG/ML IV SOLN
INTRAVENOUS | Status: DC | PRN
  Administered 2021-08-01: 08:00:00 10 mL via INTRA_ARTERIAL

## 2021-08-01 MED ORDER — HEPARIN (PORCINE) IN NACL 1000-0.9 UT/500ML-% IV SOLN
INTRAVENOUS | Status: AC
  Filled 2021-08-01: qty 500

## 2021-08-01 MED ORDER — FENTANYL CITRATE (PF) 100 MCG/2ML IJ SOLN
INTRAMUSCULAR | Status: AC
  Filled 2021-08-01: qty 2

## 2021-08-01 MED ORDER — FENTANYL CITRATE (PF) 100 MCG/2ML IJ SOLN
INTRAMUSCULAR | Status: DC | PRN
  Administered 2021-08-01: 25 ug via INTRAVENOUS
  Administered 2021-08-01: 50 ug via INTRAVENOUS

## 2021-08-01 MED ORDER — SODIUM CHLORIDE 0.9 % WEIGHT BASED INFUSION: 1.0000 mL/kg/h | INTRAVENOUS | Status: DC

## 2021-08-01 MED ORDER — ONDANSETRON HCL 4 MG/2ML IJ SOLN: 4.0000 mg | Freq: Four times a day (QID) | INTRAMUSCULAR | Status: DC | PRN

## 2021-08-01 MED ORDER — HYDRALAZINE HCL 20 MG/ML IJ SOLN
10.0000 mg | INTRAMUSCULAR | Status: DC | PRN
  Administered 2021-08-01: 10:00:00 10 mg via INTRAVENOUS

## 2021-08-01 MED ORDER — SODIUM CHLORIDE 0.9% FLUSH: 3.0000 mL | Freq: Two times a day (BID) | INTRAVENOUS | Status: DC

## 2021-08-01 MED ORDER — MIDAZOLAM HCL 2 MG/2ML IJ SOLN
INTRAMUSCULAR | Status: DC | PRN
  Administered 2021-08-01: 2 mg via INTRAVENOUS
  Administered 2021-08-01: 1 mg via INTRAVENOUS

## 2021-08-01 MED ORDER — VERAPAMIL HCL 2.5 MG/ML IV SOLN
INTRAVENOUS | Status: AC
  Filled 2021-08-01: qty 2

## 2021-08-01 MED ORDER — HEPARIN SODIUM (PORCINE) 1000 UNIT/ML IJ SOLN
INTRAMUSCULAR | Status: DC | PRN
  Administered 2021-08-01: 6000 [IU] via INTRAVENOUS
  Administered 2021-08-01: 5000 [IU] via INTRAVENOUS
  Administered 2021-08-01: 3000 [IU] via INTRAVENOUS

## 2021-08-01 MED ORDER — IOHEXOL 350 MG/ML SOLN
INTRAVENOUS | Status: DC | PRN
  Administered 2021-08-01: 09:00:00 70 mL via INTRA_ARTERIAL

## 2021-08-01 MED ORDER — HEPARIN SODIUM (PORCINE) 1000 UNIT/ML IJ SOLN
INTRAMUSCULAR | Status: AC
  Filled 2021-08-01: qty 10

## 2021-08-01 SURGICAL SUPPLY — 15 items
CATH INFINITI 5FR ANG PIGTAIL (CATHETERS) ×2 IMPLANT
CATH INFINITI 5FR JL4 (CATHETERS) ×2 IMPLANT
CATH INFINITI JR4 5F (CATHETERS) ×2 IMPLANT
CATH LAUNCHER 6FR JL4 (CATHETERS) ×2 IMPLANT
CATH VISTA GUIDE 6FR XBLAD3.5 (CATHETERS) ×2 IMPLANT
DEVICE RAD COMP TR BAND LRG (VASCULAR PRODUCTS) ×2 IMPLANT
GLIDESHEATH SLEND SS 6F .021 (SHEATH) ×2 IMPLANT
GUIDEWIRE INQWIRE 1.5J.035X260 (WIRE) IMPLANT
GUIDEWIRE PRESSURE X 175 (WIRE) ×2 IMPLANT
INQWIRE 1.5J .035X260CM (WIRE) ×3
KIT HEART LEFT (KITS) ×5 IMPLANT
PACK CARDIAC CATHETERIZATION (CUSTOM PROCEDURE TRAY) ×3 IMPLANT
SHEATH PROBE COVER 6X72 (BAG) ×2 IMPLANT
TRANSDUCER W/STOPCOCK (MISCELLANEOUS) ×3 IMPLANT
TUBING CIL FLEX 10 FLL-RA (TUBING) ×3 IMPLANT

## 2021-08-01 NOTE — Interval H&P Note (Signed)
History and Physical Interval Note:  08/01/2021 7:25 AM  Sherran Needs  has presented today for surgery, with the diagnosis of chest pain, abnormal ct.  The various methods of treatment have been discussed with the patient and family. After consideration of risks, benefits and other options for treatment, the patient has consented to  Procedure(s): LEFT HEART CATH AND CORONARY ANGIOGRAPHY (N/A) as a surgical intervention.  The patient's history has been reviewed, patient examined, no change in status, stable for surgery.  I have reviewed the patient's chart and labs.  Questions were answered to the patient's satisfaction.    Cath Lab Visit (complete for each Cath Lab visit)  Clinical Evaluation Leading to the Procedure:   ACS: No.  Non-ACS:    Anginal Classification: CCS III  Anti-ischemic medical therapy: No Therapy  Non-Invasive Test Results: (Coronary CTA with severe LAD stenosis)  Prior CABG: No previous CABG        Lauree Chandler

## 2021-08-07 ENCOUNTER — Encounter: Payer: Self-pay | Admitting: Cardiology

## 2021-08-09 ENCOUNTER — Encounter: Payer: Self-pay | Admitting: Pulmonary Disease

## 2021-08-12 NOTE — Telephone Encounter (Signed)
I called the patient and he is using his Trelegy daily and he noticed on Friday that he was more short of breath when walking while trying to play golf. He reports that you had sent him to cardiology and they told him his heart was ok and he did not need a procedure.  He was offered a visit and he wants to know if you recommend anything else from a breathing stand point to help him not feel as fatigued. Please advise.

## 2021-08-16 ENCOUNTER — Other Ambulatory Visit (HOSPITAL_COMMUNITY): Payer: Self-pay | Admitting: Family Medicine

## 2021-08-16 ENCOUNTER — Ambulatory Visit (HOSPITAL_COMMUNITY)
Admission: RE | Admit: 2021-08-16 | Discharge: 2021-08-16 | Disposition: A | Discharge status: Home / Self Care | Payer: BC Managed Care – PPO | Source: Ambulatory Visit | Attending: Family Medicine | Admitting: Family Medicine

## 2021-08-16 ENCOUNTER — Inpatient Hospital Stay (HOSPITAL_COMMUNITY)
Admission: EM | Admit: 2021-08-16 | Discharge: 2021-08-18 | DRG: 175 | Disposition: A | Discharge status: Home / Self Care | Payer: BC Managed Care – PPO | Attending: Internal Medicine | Admitting: Internal Medicine

## 2021-08-16 ENCOUNTER — Encounter (HOSPITAL_COMMUNITY): Payer: Self-pay | Admitting: *Deleted

## 2021-08-16 ENCOUNTER — Other Ambulatory Visit: Payer: Self-pay

## 2021-08-16 DIAGNOSIS — I2510 Atherosclerotic heart disease of native coronary artery without angina pectoris: Secondary | ICD-10-CM | POA: Diagnosis present

## 2021-08-16 DIAGNOSIS — Z87891 Personal history of nicotine dependence: Secondary | ICD-10-CM | POA: Diagnosis not present

## 2021-08-16 DIAGNOSIS — E876 Hypokalemia: Secondary | ICD-10-CM | POA: Diagnosis present

## 2021-08-16 DIAGNOSIS — Z9989 Dependence on other enabling machines and devices: Secondary | ICD-10-CM

## 2021-08-16 DIAGNOSIS — J449 Chronic obstructive pulmonary disease, unspecified: Secondary | ICD-10-CM | POA: Diagnosis not present

## 2021-08-16 DIAGNOSIS — I82452 Acute embolism and thrombosis of left peroneal vein: Secondary | ICD-10-CM | POA: Diagnosis present

## 2021-08-16 DIAGNOSIS — Z86718 Personal history of other venous thrombosis and embolism: Secondary | ICD-10-CM

## 2021-08-16 DIAGNOSIS — E782 Mixed hyperlipidemia: Secondary | ICD-10-CM

## 2021-08-16 DIAGNOSIS — Z79899 Other long term (current) drug therapy: Secondary | ICD-10-CM

## 2021-08-16 DIAGNOSIS — Z8546 Personal history of malignant neoplasm of prostate: Secondary | ICD-10-CM | POA: Diagnosis not present

## 2021-08-16 DIAGNOSIS — M109 Gout, unspecified: Secondary | ICD-10-CM | POA: Diagnosis present

## 2021-08-16 DIAGNOSIS — C61 Malignant neoplasm of prostate: Secondary | ICD-10-CM | POA: Insufficient documentation

## 2021-08-16 DIAGNOSIS — I2699 Other pulmonary embolism without acute cor pulmonale: Secondary | ICD-10-CM | POA: Diagnosis present

## 2021-08-16 DIAGNOSIS — R739 Hyperglycemia, unspecified: Secondary | ICD-10-CM

## 2021-08-16 DIAGNOSIS — I82442 Acute embolism and thrombosis of left tibial vein: Secondary | ICD-10-CM | POA: Diagnosis present

## 2021-08-16 DIAGNOSIS — R0902 Hypoxemia: Secondary | ICD-10-CM | POA: Insufficient documentation

## 2021-08-16 DIAGNOSIS — D696 Thrombocytopenia, unspecified: Secondary | ICD-10-CM | POA: Diagnosis present

## 2021-08-16 DIAGNOSIS — I82409 Acute embolism and thrombosis of unspecified deep veins of unspecified lower extremity: Secondary | ICD-10-CM

## 2021-08-16 DIAGNOSIS — J439 Emphysema, unspecified: Secondary | ICD-10-CM | POA: Diagnosis present

## 2021-08-16 DIAGNOSIS — J9601 Acute respiratory failure with hypoxia: Secondary | ICD-10-CM | POA: Diagnosis present

## 2021-08-16 DIAGNOSIS — R0609 Other forms of dyspnea: Secondary | ICD-10-CM

## 2021-08-16 DIAGNOSIS — K219 Gastro-esophageal reflux disease without esophagitis: Secondary | ICD-10-CM | POA: Diagnosis present

## 2021-08-16 DIAGNOSIS — D6851 Activated protein C resistance: Secondary | ICD-10-CM | POA: Diagnosis present

## 2021-08-16 DIAGNOSIS — I82433 Acute embolism and thrombosis of popliteal vein, bilateral: Secondary | ICD-10-CM | POA: Diagnosis present

## 2021-08-16 DIAGNOSIS — Z7982 Long term (current) use of aspirin: Secondary | ICD-10-CM

## 2021-08-16 DIAGNOSIS — G4733 Obstructive sleep apnea (adult) (pediatric): Secondary | ICD-10-CM | POA: Diagnosis present

## 2021-08-16 DIAGNOSIS — I2602 Saddle embolus of pulmonary artery with acute cor pulmonale: Secondary | ICD-10-CM | POA: Diagnosis not present

## 2021-08-16 LAB — CBC
HCT: 40.3 % (ref 39.0–52.0)
Hemoglobin: 14 g/dL (ref 13.0–17.0)
MCH: 35.2 pg — ABNORMAL HIGH (ref 26.0–34.0)
MCHC: 34.7 g/dL (ref 30.0–36.0)
MCV: 101.3 fL — ABNORMAL HIGH (ref 80.0–100.0)
Platelets: 119 10*3/uL — ABNORMAL LOW (ref 150–400)
RBC: 3.98 MIL/uL — ABNORMAL LOW (ref 4.22–5.81)
RDW: 12.7 % (ref 11.5–15.5)
WBC: 9.3 10*3/uL (ref 4.0–10.5)
nRBC: 0 % (ref 0.0–0.2)

## 2021-08-16 LAB — BASIC METABOLIC PANEL
Anion gap: 9 (ref 5–15)
BUN: 9 mg/dL (ref 8–23)
CO2: 24 mmol/L (ref 22–32)
Calcium: 9 mg/dL (ref 8.9–10.3)
Chloride: 102 mmol/L (ref 98–111)
Creatinine, Ser: 0.97 mg/dL (ref 0.61–1.24)
GFR, Estimated: >60 mL/min (ref >60)
Glucose, Bld: 132 mg/dL — ABNORMAL HIGH (ref 70–99)
Potassium: 3.4 mmol/L — ABNORMAL LOW (ref 3.5–5.1)
Sodium: 135 mmol/L (ref 135–145)

## 2021-08-16 LAB — APTT: aPTT: 25 seconds (ref 24–36)

## 2021-08-16 LAB — MAGNESIUM: Magnesium: 1.7 mg/dL (ref 1.7–2.4)

## 2021-08-16 LAB — PROTIME-INR
INR: 1.2 (ref 0.8–1.2)
Prothrombin Time: 15 seconds (ref 11.4–15.2)

## 2021-08-16 MED ORDER — IOHEXOL 350 MG/ML SOLN
75.0000 mL | Freq: Once | INTRAVENOUS | Status: AC | PRN
  Administered 2021-08-16: 75 mL via INTRAVENOUS

## 2021-08-16 MED ORDER — UMECLIDINIUM BROMIDE 62.5 MCG/ACT IN AEPB
1.0000 | INHALATION_SPRAY | Freq: Every day | RESPIRATORY_TRACT | Status: DC
  Administered 2021-08-17 – 2021-08-18 (×2): 1 via RESPIRATORY_TRACT
  Filled 2021-08-16: qty 7

## 2021-08-16 MED ORDER — ALBUTEROL SULFATE (2.5 MG/3ML) 0.083% IN NEBU: 2.5000 mg | INHALATION_SOLUTION | Freq: Four times a day (QID) | RESPIRATORY_TRACT | Status: DC | PRN

## 2021-08-16 MED ORDER — HEPARIN (PORCINE) 25000 UT/250ML-% IV SOLN
1600.0000 [IU]/h | INTRAVENOUS | Status: DC
  Administered 2021-08-16 – 2021-08-17 (×3): 1600 [IU]/h via INTRAVENOUS
  Filled 2021-08-16 (×3): qty 250

## 2021-08-16 MED ORDER — ROSUVASTATIN CALCIUM 20 MG PO TABS
20.0000 mg | ORAL_TABLET | Freq: Every day | ORAL | Status: DC
  Administered 2021-08-17 – 2021-08-18 (×2): 20 mg via ORAL
  Filled 2021-08-16 (×2): qty 1

## 2021-08-16 MED ORDER — POTASSIUM CHLORIDE CRYS ER 20 MEQ PO TBCR
40.0000 meq | EXTENDED_RELEASE_TABLET | Freq: Once | ORAL | Status: AC
  Administered 2021-08-17: 40 meq via ORAL
  Filled 2021-08-16: qty 2

## 2021-08-16 MED ORDER — FLUTICASONE-UMECLIDIN-VILANT 100-62.5-25 MCG/ACT IN AEPB: 1.0000 | INHALATION_SPRAY | Freq: Every day | RESPIRATORY_TRACT | Status: DC

## 2021-08-16 MED ORDER — FLUTICASONE FUROATE-VILANTEROL 100-25 MCG/ACT IN AEPB
1.0000 | INHALATION_SPRAY | Freq: Every day | RESPIRATORY_TRACT | Status: DC
  Administered 2021-08-17 – 2021-08-18 (×2): 1 via RESPIRATORY_TRACT
  Filled 2021-08-16: qty 28

## 2021-08-16 MED ORDER — HEPARIN BOLUS VIA INFUSION
6000.0000 [IU] | Freq: Once | INTRAVENOUS | Status: AC
  Administered 2021-08-16: 6000 [IU] via INTRAVENOUS

## 2021-08-16 NOTE — Telephone Encounter (Signed)
Reviewed patient's chart, CTA was already ordered by his PCP and he is currently at [facility] for the CT.

## 2021-08-16 NOTE — ED Provider Notes (Addendum)
[facility] EMERGENCY DEPARTMENT Provider Note   CSN: [id number] Arrival date & time: 08/16/21  1734     History  No chief complaint on file.   [name] is a 66 y.o. male.  HPI  This patient is a very pleasant 66 year old male, history of COPD, takes metoprolol, follows up with respiratory pulmonology as well as with cardiology and over the last couple of months has had some progressive shortness of breath at sometimes it is quite severe.  About a week ago he was golfing with his wife and could not hardly walk the course because he was so short of breath.  Ultimately his family doctor ordered a CT scan of his chest to look for pulmonary embolism today which showed bilateral pulmonary embolisms and he was hypoxic thus sending him to the hospital.  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  Home Medications Prior to Admission medications   Medication Sig Start Date End Date Taking? Authorizing Provider  acetaminophen (TYLENOL) 500 MG tablet Take 2 tablets (1,000 mg total) by mouth every 6 (six) hours as needed for mild pain. 08/21/20   Meuth, Brooke A, PA-C  albuterol (PROVENTIL) (2.5 MG/3ML) 0.083% nebulizer solution Take 2.5 mg by nebulization every 6 (six) hours as needed for wheezing or shortness of breath.    [provider]  albuterol (VENTOLIN HFA) 108 (90 Base) MCG/ACT inhaler Inhale 2 puffs into the lungs every 6 (six) hours as needed for wheezing or shortness of breath.    [provider]  allopurinol (ZYLOPRIM) 300 MG tablet Take 300 mg by mouth daily. 09/17/20   [provider]  Artificial Tear Solution (SOOTHE XP OP) Place 1 drop into both eyes daily as needed (Dry eyes).    [provider]  aspirin EC 81 MG tablet  Take 81 mg by mouth daily. Swallow whole.    [provider]  doxycycline (VIBRAMYCIN) 50 MG capsule Take 50 mg by mouth daily. 08/26/20   [provider]  Fluticasone-Umeclidin-Vilant (TRELEGY ELLIPTA) 100-62.5-25 MCG/ACT AEPB Inhale 1 puff into the lungs daily. 07/11/21   Icard, Octavio Graves, DO  ibuprofen (ADVIL) 600 MG tablet Take 600 mg by mouth 2 (two) times daily as needed for pain. 06/13/21   [provider]  metoprolol succinate (TOPROL XL) 25 MG 24 hr tablet Take 1 tablet (25 mg total) by mouth daily. 07/26/21   Richardo Priest, MD  Multiple Vitamin (MULTIVITAMIN WITH MINERALS) TABS tablet Take 1 tablet by mouth daily. 08/22/20   Meuth, Brooke A, PA-C  Multiple Vitamins-Minerals (MULTIVITAMIN WITH MINERALS) tablet Take 1 tablet by mouth daily.    [provider]  psyllium (METAMUCIL SMOOTH TEXTURE) 28 % packet Take 1 packet by mouth daily.    [provider]  rosuvastatin (CRESTOR) 20 MG tablet Take 1 tablet (20 mg total) by mouth daily. 07/26/21 10/24/21  Richardo Priest, MD      Allergies    Patient has no known allergies.    Review of Systems   Review of Systems  All other systems reviewed and are negative.  Physical Exam Updated Vital Signs BP 134/84    Pulse 71    Temp 98.8 F (37.1 C) (Oral)  Resp (!) 23    Ht 1.854 m (6\' 1" )    Wt 106.6 kg    SpO2 95%    BMI 31.00 kg/m  Physical Exam Vitals and nursing note reviewed.  Constitutional:      General: He is not in acute distress.    Appearance: He is well-developed.  HENT:     Head: Normocephalic and atraumatic.     Mouth/Throat:     Pharynx: No oropharyngeal exudate.  Eyes:     General: No scleral icterus.       Right eye: No discharge.        Left eye: No discharge.     Conjunctiva/sclera: Conjunctivae normal.     Pupils: Pupils are equal, round, and reactive to light.  Neck:     Thyroid: No thyromegaly.     Vascular: No JVD.  Cardiovascular:     Rate and Rhythm: Normal  rate and regular rhythm.     Heart sounds: Normal heart sounds. No murmur heard.   No friction rub. No gallop.  Pulmonary:     Effort: Pulmonary effort is normal. No respiratory distress.     Breath sounds: Normal breath sounds. No wheezing or rales.  Abdominal:     General: Bowel sounds are normal. There is no distension.     Palpations: Abdomen is soft. There is no mass.     Tenderness: There is no abdominal tenderness.  Musculoskeletal:        General: No tenderness. Normal range of motion.     Cervical back: Normal range of motion and neck supple.     Right lower leg: Edema present.     Left lower leg: Edema present.  Lymphadenopathy:     Cervical: No cervical adenopathy.  Skin:    General: Skin is warm and dry.     Findings: No erythema or rash.  Neurological:     Mental Status: He is alert.     Coordination: Coordination normal.  Psychiatric:        Behavior: Behavior normal.    ED Results / Procedures / Treatments   Labs (all labs ordered are listed, but only abnormal results are displayed) Labs Reviewed  BASIC METABOLIC PANEL  CBC  PROTIME-INR  APTT    EKG EKG Interpretation  Date/Time:  Friday August 16 2021 18:05:37 EST Ventricular Rate:  68 PR Interval:  227 QRS Duration: 94 QT Interval:  389 QTC Calculation: 414 R Axis:   -8 Text Interpretation: Sinus or ectopic atrial rhythm Prolonged PR interval Low voltage, precordial leads Abnormal R-wave progression, early transition Confirmed by Noemi Chapel [phone]) on 08/16/2021 6:50:35 PM  Radiology CT Angio Chest Pulmonary Embolism (PE) W or WO Contrast  Result Date: 08/16/2021 CLINICAL DATA:  Dyspnea on exertion, hypoxemia for 1 month, with progressive worsening EXAM: CT ANGIOGRAPHY CHEST WITH CONTRAST TECHNIQUE: Multidetector CT imaging of the chest was performed using the standard protocol during bolus administration of intravenous contrast. Multiplanar CT image reconstructions and MIPs were obtained to  evaluate the vascular anatomy. RADIATION DOSE REDUCTION: This exam was performed according to the departmental dose-optimization program which includes automated exposure control, adjustment of the mA and/or kV according to patient size and/or use of iterative reconstruction technique. CONTRAST:  59mL OMNIPAQUE IOHEXOL 350 MG/ML SOLN COMPARISON:  07/24/2021, 06/07/2021 FINDINGS: Cardiovascular: This is a technically adequate evaluation of the pulmonary vasculature. There are multiple bilateral central and segmental pulmonary emboli, right greater than left. There is no definitive evidence of right heart  strain, with equivocal straightening of the interventricular septum but no significant increase in the RV/LV ratio, which measures approximately 0.8. No pericardial effusion. No evidence of thoracic aortic aneurysm or dissection. Mild atherosclerosis of the aortic arch, with severe coronary artery atherosclerosis identified. Mediastinum/Nodes: No enlarged mediastinal, hilar, or axillary lymph nodes. Thyroid gland, trachea, and esophagus demonstrate no significant findings. Lungs/Pleura: Stable upper lobe predominant emphysema. No airspace disease, effusion, or pneumothorax. Central airways are patent. Upper Abdomen: Diffuse hepatic steatosis. Likely focal fatty sparing within the left lobe liver. No acute upper abdominal process. Musculoskeletal: No acute or destructive bony lesions. Reconstructed images demonstrate no additional findings. Review of the MIP images confirms the above findings. IMPRESSION: 1. Bilateral central and segmental pulmonary emboli. No definitive evidence of right heart strain despite moderate clot burden, with RV/LV ratio measuring approximately 0.8 and equivocal straightening of the interventricular septum. 2. Aortic Atherosclerosis (ICD10-I70.0) and Emphysema (ICD10-J43.9). 3. Hepatic steatosis. Critical Value/emergent results were called by telephone at the time of interpretation on  08/16/2021 at 5:11 pm to provider Leslie Andrea , who verbally acknowledged these results. Electronically Signed   By: Randa Ngo M.D.   On: 08/16/2021 17:14    Procedures .Critical Care Performed by: Noemi Chapel, MD Authorized by: Noemi Chapel, MD   Critical care provider statement:    Critical care time (minutes):  30   Critical care time was exclusive of:  Separately billable procedures and treating other patients and teaching time   Critical care was necessary to treat or prevent imminent or life-threatening deterioration of the following conditions: bilateral PE.   Critical care was time spent personally by me on the following activities:  Development of treatment plan with patient or surrogate, discussions with consultants, evaluation of patient's response to treatment, examination of patient, ordering and review of laboratory studies, ordering and review of radiographic studies, ordering and performing treatments and interventions, pulse oximetry, re-evaluation of patient's condition, review of old charts and obtaining history from patient or surrogate   Care discussed with: admitting provider   Comments:          Medications Ordered in ED Medications - No data to display  ED Course/ Medical Decision Making/ A&P                           Medical Decision Making  This patient presents to the ED for concern of pulmonary embolism and shortness of breath, this involves an extensive number of treatment options, and is a complaint that carries with it a high risk of complications and morbidity.  The differential diagnosis includes pulmonary embolism, cardiac strain, pulmonary hypertension   Co morbidities that complicate the patient evaluation  COPD, likely has some element of congestive heart failure given recent bilateral PE   Additional history obtained:  Additional history obtained from medical record and discussion with the patient's family doctor Dr.  Karie Kirks External records from outside source obtained and reviewed including family doctor, sent the patient here after CT angiogram for bilateral pulmonary embolisms and   Lab Tests:  I Ordered, and personally interpreted labs.  The pertinent results include: Lab work-up rather   Imaging Studies ordered:  I ordered imaging studies including CT angiogram of the I independently visualized and interpreted imaging which showed bilateral pulmonary embolism covering almost all 4 lung fields right and left. I agree with the radiologist interpretation   Cardiac Monitoring:  The patient was maintained on a cardiac monitor.  I  personally viewed and interpreted the cardiac monitored which showed an underlying rhythm of: Normal sinus rhythm   Medicines ordered and prescription drug management:  I ordered medication including heparin drip for bilateral pulmonary embolism Reevaluation of the patient after these medicines showed that the patient stayed the same I have reviewed the patients home medicines and have made adjustments as needed    Critical Interventions:  Identification of pulmonary embolisms Initiation of anticoagulant therapy by drip   Consultations Obtained:  I requested consultation with the hospitalist,  and discussed lab and imaging findings as well as pertinent plan - they recommend: Admission to the hospital   Problem List / ED Course:  Pulmonary embolism, prescribed heparin which has been initiated by drip, patient is critically ill   Reevaluation:  After the interventions noted above, I reevaluated the patient and found that they have :improved    Dispostion:  After consideration of the diagnostic results and the patients response to treatment, I feel that the patent would benefit from admission to the hospital.          Final Clinical Impression(s) / ED Diagnoses Final diagnoses:  Bilateral pulmonary embolism ([place])     Noemi Chapel,  MD 08/16/21 Hardin, Jahmire Ruffins, MD 08/16/21 1850

## 2021-08-16 NOTE — H&P (Signed)
History and Physical  [name]:[id number] DOB: March 06, 1956 DOA: 08/16/2021  Referring physician: Noemi Chapel, MD PCP: [name], MD  Patient coming from: Home  Chief Complaint: Shortness of breath  HPI: [name] is a 66 y.o. male with medical history significant for COPD, prior DVTs, hyperlipidemia who presents to the emergency department due to progressive shortness of breath that has been ongoing for about 1 month.  He thought it was due to his COPD, so he followed up with his pulmonology who ran some tests and CT done at that time was negative for PE per patient, so he was referred to a cardiologist (due to associated chest pain), cardiac CTA was done and was noted to have a very high calcium score of 2641 99th percentile for age and sex and he has significant CAD with severe stenosis of left anterior descending coronary artery proximal.  He was then advised to undergo left heart catheterization which was done on 08/01/2021 and showed coronary CTA with severe LAD stenosis without stent placement.   Patient was very short of breath and could barely walk when golfing with wife about a week ago, he followed up with his PCP, D-dimer was done and was highly elevated, CT scan was then ordered to look for pulmonary embolism, this was done today and showed pulmonary embolism.  He was asked to the ED for further evaluation and management by the radiologist.  Patient endorsed prior history of episodes of DVTs in the past which required short-term anticoagulants, with most recent episode being last year.  He complained  of a few weeks history of some leg cramps.  ED Course:  In the emergency department, he was tachypneic, but otherwise was hemodynamically stable.  Work-up in the ED showed thrombocytopenia, elevated MCV, hypokalemia, hyperglycemia. CT angiogram for chest with contrast showed bilateral central and segmental pulmonary emboli. No definitive evidence of right heart strain  despite moderate clot burden. Patient was started on IV heparin drip, hospitalist was asked to admit patient for further evaluation and management.  Review of Systems: A full 10 point Review of Systems was done, except as stated above, all other Review of systems were negative.  Past Medical History:  Diagnosis Date   Bilateral shoulder bursitis    Chronic back pain    COPD (chronic obstructive pulmonary disease) with emphysema ([name])    DDD (degenerative disc disease), lumbar    hx epideral injection  L5--S1    Diverticulitis    DVT (deep venous thrombosis) ([place]) 11/19/2018   Dysphasia    intermittant w/ food    Dyspnea    Dyspnea on exertion    GERD (gastroesophageal reflux disease)    Gout    L great toe   Heterozygous factor V Leiden mutation ([place])    Hiatal hernia    Hip problem 2020   left, seeing orthopedics   History of acute pancreatitis    alcoholic pancreatitis [id number] and 01-30-2012   History of colon polyps    10-17-2005  hyperplastic polyp's   History of esophageal stricture    s/p  dilation 02-02-2017   History of peptic ulcer 1990s   History of pneumococcal pneumonia 2006   bilateral pneumonia w/ left lower lobe abscess treated w/ chest tube with suction for drainage   History of pneumothorax    1984--  left spontaneous pneumothorax ,  treated w/ chest tube   Malignant neoplasm of prostate [facility]) urologist-  dr dahlstedt/  oncologist -- dr Tammi Klippel  dx 12-02-2016-- Stage T2b, Gleason 3+4,  PSA 5.99,  vol 25cc   Numbness of left foot    outer left ankle numb-- per pt has appt. w/ neurologist   OSA (obstructive sleep apnea)    per pt has not used cpap over year ago from 04-16-2017--- per study 01-17-2010  severe osa   Solid nodule of lung greater than 8 mm in diameter 05/11/2018   03/2018: RUL Nodule, 28mm   Wears glasses    Past Surgical History:  Procedure Laterality Date   BIOPSY  02/02/2017   Procedure: BIOPSY;  Surgeon: Daneil Dolin, MD;   [facility];  Service: Endoscopy;;  duodenal bx's, esophgeal bx's   BIOPSY  07/30/2017   Procedure: BIOPSY;  Surgeon: Daneil Dolin, MD;  [facility];  Service: Endoscopy;;  esophagus   BIOPSY  02/15/2021   Procedure: BIOPSY;  Surgeon: Daneil Dolin, MD;  [facility];  Service: Endoscopy;;   CARDIOVASCULAR STRESS TEST  09/12/2009   normal nuclear perfusion study w/ no ischemia /  normal LV function and wall motion , ef 57%   COLONOSCOPY  2007   Dr. Gala Romney: hyperplastic polyps, internal hemorrhoids    COLONOSCOPY WITH PROPOFOL N/A 05/03/2018   Dr. Gala Romney: diverticulosis in entire colon, multiple polyps in sigmoid, at splenic flexure, and ascending colon, not all polyps removed. Tubular adenomas and inflammatory polyps. Needs 3 year surveilance   COLONOSCOPY WITH PROPOFOL N/A 02/15/2021   Procedure: COLONOSCOPY WITH PROPOFOL;  Surgeon: Daneil Dolin, MD;  [facility];  Service: Endoscopy;  Laterality: N/A;  2:30pm   EPIDURAL BLOCK INJECTION  2020   ESOPHAGOGASTRODUODENOSCOPY (EGD) WITH PROPOFOL N/A 02/02/2017   Dr. Gala Romney: esophageal stenosis s/p dilation and biopsy, medium sized hiatal hernia, normal duodenum   ESOPHAGOGASTRODUODENOSCOPY (EGD) WITH PROPOFOL N/A 07/30/2017   Dr. Gala Romney: esophageal stenosis s/p dilation and biopsy, medium-sized hiatal hernia, normal duodenum   HEMORRHOID SURGERY  01-21-2008    [facility]   INTRAVASCULAR PRESSURE WIRE/FFR STUDY N/A 08/01/2021   Procedure: INTRAVASCULAR PRESSURE WIRE/FFR STUDY;  Surgeon: Burnell Blanks, MD;  Location: [facility];  Service: Cardiovascular;  Laterality: N/A;   IR RADIOLOGIST EVAL & MGMT  08/29/2020   LEFT HEART CATH AND CORONARY ANGIOGRAPHY N/A 08/01/2021   Procedure: LEFT HEART CATH AND CORONARY ANGIOGRAPHY;  Surgeon: Burnell Blanks, MD;  Location: [place] [facility];  Service: Cardiovascular;  Laterality: N/A;   MALONEY DILATION N/A 02/02/2017   Procedure:  Venia Minks DILATION;  Surgeon: Daneil Dolin, MD;  [facility];  Service: Endoscopy;  Laterality: N/A;   MALONEY DILATION N/A 07/30/2017   Procedure: Venia Minks DILATION;  Surgeon: Daneil Dolin, MD;  [facility];  Service: Endoscopy;  Laterality: N/A;   POLYPECTOMY  05/03/2018   Procedure: POLYPECTOMY;  Surgeon: Daneil Dolin, MD;  [facility];  Service: Endoscopy;;  ascending colon polyp, sigmoid colon polyps (multiple)   POLYPECTOMY  02/15/2021   Procedure: POLYPECTOMY;  Surgeon: Daneil Dolin, MD;  [facility];  Service: Endoscopy;;   RADIOACTIVE SEED IMPLANT N/A 04/23/2017   Procedure: RADIOACTIVE SEED IMPLANT/BRACHYTHERAPY IMPLANT;  Surgeon: Franchot Gallo, MD;  [facility];  Service: Urology;  Laterality: N/A;   SHOULDER ARTHROSCOPY  08/05/2003   SPACE OAR INSTILLATION N/A 04/23/2017   Procedure: SPACE OAR INSTILLATION;  Surgeon: Franchot Gallo, MD;  Location: [facility];  Service: Urology;  Laterality: N/A;  TRANSTHORACIC ECHOCARDIOGRAM  03/28/2009   mild LVH, ef 55-60%/ mild aorta calcification   VASECTOMY  08/05/1983   VIDEO ASSISTED THORACOSCOPY (VATS)/DECORTICATION Left     Social History:  reports that he quit smoking about 5 years ago. His smoking use included cigarettes and e-cigarettes. He has a 30.00 pack-year smoking history. He has never used smokeless tobacco. He reports current alcohol use of about 5.0 standard drinks per week. He reports that he does not use drugs.   No Known Allergies  Family History  Problem Relation Age of Onset   Diabetes Mother    Heart disease Father    Heart disease Brother    Cancer Neg Hx    Colon cancer Neg Hx    Colon polyps Neg Hx    Neuropathy Neg Hx      Prior to Admission medications   Medication Sig Start Date End Date Taking? Authorizing Provider  acetaminophen (TYLENOL) 500 MG tablet Take 2 tablets (1,000 mg total) by mouth every  6 (six) hours as needed for mild pain. 08/21/20  Yes Meuth, Brooke A, PA-C  albuterol (PROVENTIL) (2.5 MG/3ML) 0.083% nebulizer solution Take 2.5 mg by nebulization every 6 (six) hours as needed for wheezing or shortness of breath.   Yes [provider]  albuterol (VENTOLIN HFA) 108 (90 Base) MCG/ACT inhaler Inhale 2 puffs into the lungs every 6 (six) hours as needed for wheezing or shortness of breath.   Yes [provider]  allopurinol (ZYLOPRIM) 300 MG tablet Take 300 mg by mouth daily. 09/17/20  Yes [provider]  Artificial Tear Solution (SOOTHE XP OP) Place 1 drop into both eyes daily as needed (Dry eyes).   Yes [provider]  aspirin EC 81 MG tablet Take 81 mg by mouth daily. Swallow whole.   Yes [provider]  doxycycline (VIBRAMYCIN) 50 MG capsule Take 50 mg by mouth daily. 08/26/20  Yes [provider]  Fluticasone-Umeclidin-Vilant (TRELEGY ELLIPTA) 100-62.5-25 MCG/ACT AEPB Inhale 1 puff into the lungs daily. 07/11/21  Yes Icard, Bradley L, DO  ibuprofen (ADVIL) 600 MG tablet Take 600 mg by mouth 2 (two) times daily as needed for pain. 06/13/21  Yes [provider]  metoprolol succinate (TOPROL XL) 25 MG 24 hr tablet Take 1 tablet (25 mg total) by mouth daily. 07/26/21  Yes Richardo Priest, MD  psyllium (METAMUCIL SMOOTH TEXTURE) 28 % packet Take 1 packet by mouth daily.   Yes [provider]  rosuvastatin (CRESTOR) 20 MG tablet Take 1 tablet (20 mg total) by mouth daily. 07/26/21 10/24/21 Yes Richardo Priest, MD  Multiple Vitamin (MULTIVITAMIN WITH MINERALS) TABS tablet Take 1 tablet by mouth daily. Patient not taking: Reported on 08/16/2021 08/22/20   Margie Billet A, PA-C  Multiple Vitamins-Minerals (MULTIVITAMIN WITH MINERALS) tablet Take 1 tablet by mouth daily. Patient not taking: Reported on 08/16/2021    [provider]    Physical Exam: BP 131/78    Pulse 74    Temp 98.8 F (37.1 C) (Oral)    Resp 19     Ht 6\' 1"  (1.854 m)    Wt 106.6 kg    SpO2 90%    BMI 31.00 kg/m   General: 66 y.o. year-old male well developed well nourished in no acute distress.  Alert and oriented x3. HEENT: NCAT, EOMI Neck: Supple, trachea medial Cardiovascular: Regular rate and rhythm with no rubs or gallops.  No thyromegaly or JVD noted.  No lower extremity edema. 2/4 pulses  in all 4 extremities. Respiratory: Clear to auscultation with no wheezes or rales. Good inspiratory effort. Abdomen: Soft, nontender nondistended with normal bowel sounds x4 quadrants. Muskuloskeletal: No cyanosis, clubbing or edema noted bilaterally Vascular: Bilateral chronic venous stasis Neuro: CN II-XII intact, strength 5/5 x 4, sensation, reflexes intact Skin: No ulcerative lesions noted or rashes Psychiatry: Judgement and insight appear normal. Mood is appropriate for condition and setting          Labs on Admission:  Basic Metabolic Panel: Recent Labs  Lab 08/16/21 1750  NA 135  K 3.4*  CL 102  CO2 24  GLUCOSE 132*  BUN 9  CREATININE 0.97  CALCIUM 9.0   Liver Function Tests: No results for input(s): AST, ALT, ALKPHOS, BILITOT, PROT, ALBUMIN in the last 168 hours. No results for input(s): LIPASE, AMYLASE in the last 168 hours. No results for input(s): AMMONIA in the last 168 hours. CBC: Recent Labs  Lab 08/16/21 1750  WBC 9.3  HGB 14.0  HCT 40.3  MCV 101.3*  PLT 119*   Cardiac Enzymes: No results for input(s): CKTOTAL, CKMB, CKMBINDEX, TROPONINI in the last 168 hours.  BNP (last 3 results) No results for input(s): BNP in the last 8760 hours.  ProBNP (last 3 results) No results for input(s): PROBNP in the last 8760 hours.  CBG: No results for input(s): GLUCAP in the last 168 hours.  Radiological Exams on Admission: CT Angio Chest Pulmonary Embolism (PE) W or WO Contrast  Result Date: 08/16/2021 CLINICAL DATA:  Dyspnea on exertion, hypoxemia for 1 month, with progressive worsening EXAM: CT ANGIOGRAPHY  CHEST WITH CONTRAST TECHNIQUE: Multidetector CT imaging of the chest was performed using the standard protocol during bolus administration of intravenous contrast. Multiplanar CT image reconstructions and MIPs were obtained to evaluate the vascular anatomy. RADIATION DOSE REDUCTION: This exam was performed according to the departmental dose-optimization program which includes automated exposure control, adjustment of the mA and/or kV according to patient size and/or use of iterative reconstruction technique. CONTRAST:  17mL OMNIPAQUE IOHEXOL 350 MG/ML SOLN COMPARISON:  07/24/2021, 06/07/2021 FINDINGS: Cardiovascular: This is a technically adequate evaluation of the pulmonary vasculature. There are multiple bilateral central and segmental pulmonary emboli, right greater than left. There is no definitive evidence of right heart strain, with equivocal straightening of the interventricular septum but no significant increase in the RV/LV ratio, which measures approximately 0.8. No pericardial effusion. No evidence of thoracic aortic aneurysm or dissection. Mild atherosclerosis of the aortic arch, with severe coronary artery atherosclerosis identified. Mediastinum/Nodes: No enlarged mediastinal, hilar, or axillary lymph nodes. Thyroid gland, trachea, and esophagus demonstrate no significant findings. Lungs/Pleura: Stable upper lobe predominant emphysema. No airspace disease, effusion, or pneumothorax. Central airways are patent. Upper Abdomen: Diffuse hepatic steatosis. Likely focal fatty sparing within the left lobe liver. No acute upper abdominal process. Musculoskeletal: No acute or destructive bony lesions. Reconstructed images demonstrate no additional findings. Review of the MIP images confirms the above findings. IMPRESSION: 1. Bilateral central and segmental pulmonary emboli. No definitive evidence of right heart strain despite moderate clot burden, with RV/LV ratio measuring approximately 0.8 and equivocal  straightening of the interventricular septum. 2. Aortic Atherosclerosis (ICD10-I70.0) and Emphysema (ICD10-J43.9). 3. Hepatic steatosis. Critical Value/emergent results were called by telephone at the time of interpretation on 08/16/2021 at 5:11 pm to provider Leslie Andrea , who verbally acknowledged these results. Electronically Signed   By: Randa Ngo M.D.   On: 08/16/2021 17:14    EKG: I independently viewed the EKG done  and my findings are as followed: Normal sinus or ectopic atrial rhythm at a rate of 68 bpm  Assessment/Plan Present on Admission:  Pulmonary embolism ([name])  Thrombocytopenia ([place])  Chronic obstructive lung disease ([place])  Principal Problem:   Pulmonary embolism ([place]) Active Problems:   Thrombocytopenia ([name])   Obstructive sleep apnea treated with continuous positive airway pressure (CPAP)   Chronic obstructive lung disease ([name])   Acute respiratory failure with hypoxia ([name])   Hyperglycemia   Hypokalemia   History of DVT (deep vein thrombosis)   Mixed hyperlipidemia  Acute respiratory failure with hypoxia in the setting of pulmonary embolism Patient presented with shortness of breath CT angiography chest with contrast showed bilateral central and segmental pulmonary emboli. Patient was started on IV heparin drip with plan to transition to [place] in the morning Bilateral lower extremity ultrasound will be done in the morning Echocardiogram will be done in the morning  History of DVT Patient states that he has had prior DVT in each leg during which he was placed on short anticoagulant Bilateral lower extremity ultrasound will be done in the morning  Hypokalemia K+ 3.4, this will be replenished  Hyperglycemia possibly secondary to reactive process CBG 132, continue to monitor blood glucose levels  COPD Continue albuterol, Trelegy Ellipta  Elevated MCV MCV 101.3, folate levels and vitamin B12 levels will be checked  Thrombocytopenia possibly  reactive Platelets 119, continue to monitor with morning labs  Mixed hyperlipidemia Continue Crestor  OSA on CPAP Continue CPAP  DVT prophylaxis: Heparin drip  Code Status: Full code  Family Communication: None at bedside  Disposition Plan:  Patient is from:                        home Anticipated DC to:                   SNF or family members home Anticipated DC date:               2-3 days Anticipated DC barriers:          Patient requires inpatient management due to PE currently requiring IV heparin drip   Consults called: None  Admission status: Inpatient    Bernadette Hoit MD [facility]  08/16/2021, 9:45 PM

## 2021-08-16 NOTE — ED Triage Notes (Signed)
Pt has been SOB for a few days and saw his PCP. They sent him to have a CT scan today and he was found to have bilateral PEs. Pt started having a severe headache while in the CT scanner and reports it now feels partially numb.

## 2021-08-16 NOTE — Progress Notes (Signed)
ANTICOAGULATION CONSULT NOTE - Initial Consult  Pharmacy Consult for Heparin Indication: pulmonary embolus  No Known Allergies  Patient Measurements: Height: 6\' 1"  (185.4 cm) Weight: 106.6 kg (235 lb) IBW/kg (Calculated) : 79.9 Heparin Dosing Weight: 100 kg  Vital Signs: Temp: 98.8 F (37.1 C) (01/13 1747) Temp Source: Oral (01/13 1747) BP: 126/77 (01/13 1900) Pulse Rate: 65 (01/13 1900)  Labs: Recent Labs    08/16/21 1750  HGB 14.0  HCT 40.3  PLT 119*  APTT 25  LABPROT 15.0  INR 1.2  CREATININE 0.97    Estimated Creatinine Clearance: 98.6 mL/min (by C-G formula based on SCr of 0.97 mg/dL).   Medical History: Past Medical History:  Diagnosis Date   Bilateral shoulder bursitis    Chronic back pain    COPD (chronic obstructive pulmonary disease) with emphysema ([name])    DDD (degenerative disc disease), lumbar    hx epideral injection  L5--S1    Diverticulitis    DVT (deep venous thrombosis) ([place]) 11/19/2018   Dysphasia    intermittant w/ food    Dyspnea    Dyspnea on exertion    GERD (gastroesophageal reflux disease)    Gout    L great toe   Heterozygous factor V Leiden mutation ([place])    Hiatal hernia    Hip problem 2020   left, seeing orthopedics   History of acute pancreatitis    alcoholic pancreatitis [id number] and 01-30-2012   History of colon polyps    10-17-2005  hyperplastic polyp's   History of esophageal stricture    s/p  dilation 02-02-2017   History of peptic ulcer 1990s   History of pneumococcal pneumonia 2006   bilateral pneumonia w/ left lower lobe abscess treated w/ chest tube with suction for drainage   History of pneumothorax    1984--  left spontaneous pneumothorax ,  treated w/ chest tube   Malignant neoplasm of prostate [facility]) urologist-  dr dahlstedt/  oncologist -- dr Tammi Klippel   dx 12-02-2016-- Stage T2b, Gleason 3+4,  PSA 5.99,  vol 25cc   Numbness of left foot    outer left ankle numb-- per pt has appt. w/ neurologist   OSA  (obstructive sleep apnea)    per pt has not used cpap over year ago from 04-16-2017--- per study 01-17-2010  severe osa   Solid nodule of lung greater than 8 mm in diameter 05/11/2018   03/2018: RUL Nodule, 58mm   Wears glasses    Assessment:  66 yr old male to begin IV heparin for bilateral PE per CTA, no definitive evidence of right heart strain.  Hx heterozygous Factor V Leiden mutation and prior DVTs. Last 10/2020, off anticoagulants since 02/2021.  Prior instances noted weakly provoked and repeat hypercoagulation panel negative.   Goal of Therapy:  Heparin level 0.3-0.7 units/ml Monitor platelets by anticoagulation protocol: Yes   Plan:  Heparin 6000 units IV x 1 Heparin drip to begin at 1600 units/hr Heparin level ~6 hours after drip begins. Daily heparin level and CBC.  Arty Baumgartner, RPh 08/16/2021,7:11 PM

## 2021-08-17 ENCOUNTER — Inpatient Hospital Stay (HOSPITAL_COMMUNITY): Payer: BC Managed Care – PPO

## 2021-08-17 DIAGNOSIS — I2602 Saddle embolus of pulmonary artery with acute cor pulmonale: Secondary | ICD-10-CM | POA: Diagnosis not present

## 2021-08-17 DIAGNOSIS — I2699 Other pulmonary embolism without acute cor pulmonale: Principal | ICD-10-CM

## 2021-08-17 LAB — ECHOCARDIOGRAM COMPLETE
AR max vel: 3.56 cm2
AV Area VTI: 3.42 cm2
AV Area mean vel: 3.54 cm2
AV Mean grad: 3 mmHg
AV Peak grad: 5.7 mmHg
Ao pk vel: 1.19 m/s
Area-P 1/2: 3.37 cm2
Calc EF: 68.9 %
Height: 73 in
S' Lateral: 3.4 cm
Single Plane A2C EF: 67.1 %
Single Plane A4C EF: 70.6 %
Weight: 3774.28 oz

## 2021-08-17 LAB — HEPARIN LEVEL (UNFRACTIONATED)
Heparin Unfractionated: 0.5 IU/mL (ref 0.30–0.70)
Heparin Unfractionated: 0.39 IU/mL (ref 0.30–0.70)

## 2021-08-17 LAB — MAGNESIUM: Magnesium: 1.8 mg/dL (ref 1.7–2.4)

## 2021-08-17 LAB — COMPREHENSIVE METABOLIC PANEL
ALT: 46 U/L — ABNORMAL HIGH (ref 0–44)
AST: 55 U/L — ABNORMAL HIGH (ref 15–41)
Albumin: 3.3 g/dL — ABNORMAL LOW (ref 3.5–5.0)
Alkaline Phosphatase: 68 U/L (ref 38–126)
Anion gap: 8 (ref 5–15)
BUN: 10 mg/dL (ref 8–23)
CO2: 26 mmol/L (ref 22–32)
Calcium: 9.1 mg/dL (ref 8.9–10.3)
Chloride: 105 mmol/L (ref 98–111)
Creatinine, Ser: 0.88 mg/dL (ref 0.61–1.24)
GFR, Estimated: >60 mL/min (ref >60)
Glucose, Bld: 99 mg/dL (ref 70–99)
Potassium: 4 mmol/L (ref 3.5–5.1)
Sodium: 139 mmol/L (ref 135–145)
Total Bilirubin: 0.7 mg/dL (ref 0.3–1.2)
Total Protein: 6.2 g/dL — ABNORMAL LOW (ref 6.5–8.1)

## 2021-08-17 LAB — APTT: aPTT: 108 seconds — ABNORMAL HIGH (ref 24–36)

## 2021-08-17 LAB — FOLATE: Folate: 7.9 ng/mL (ref >5.9)

## 2021-08-17 LAB — CBC
HCT: 41.1 % (ref 39.0–52.0)
Hemoglobin: 14.5 g/dL (ref 13.0–17.0)
MCH: 36.4 pg — ABNORMAL HIGH (ref 26.0–34.0)
MCHC: 35.3 g/dL (ref 30.0–36.0)
MCV: 103.3 fL — ABNORMAL HIGH (ref 80.0–100.0)
Platelets: 115 10*3/uL — ABNORMAL LOW (ref 150–400)
RBC: 3.98 MIL/uL — ABNORMAL LOW (ref 4.22–5.81)
RDW: 12.9 % (ref 11.5–15.5)
WBC: 9.1 10*3/uL (ref 4.0–10.5)
nRBC: 0 % (ref 0.0–0.2)

## 2021-08-17 LAB — HIV ANTIBODY (ROUTINE TESTING W REFLEX): HIV Screen 4th Generation wRfx: NONREACTIVE

## 2021-08-17 LAB — VITAMIN B12: Vitamin B-12: 271 pg/mL (ref 180–914)

## 2021-08-17 LAB — MRSA NEXT GEN BY PCR, NASAL: MRSA by PCR Next Gen: NOT DETECTED

## 2021-08-17 LAB — PHOSPHORUS: Phosphorus: 2.9 mg/dL (ref 2.5–4.6)

## 2021-08-17 MED ORDER — CHLORHEXIDINE GLUCONATE CLOTH 2 % EX PADS
6.0000 | MEDICATED_PAD | Freq: Every day | CUTANEOUS | Status: DC
  Administered 2021-08-17: 6 via TOPICAL

## 2021-08-17 MED ORDER — ONDANSETRON HCL 4 MG/2ML IJ SOLN
4.0000 mg | Freq: Four times a day (QID) | INTRAMUSCULAR | Status: DC | PRN
  Administered 2021-08-17: 4 mg via INTRAVENOUS
  Filled 2021-08-17: qty 2

## 2021-08-17 MED ORDER — ACETAMINOPHEN 325 MG PO TABS
650.0000 mg | ORAL_TABLET | Freq: Four times a day (QID) | ORAL | Status: DC | PRN
  Administered 2021-08-17: 650 mg via ORAL
  Filled 2021-08-17: qty 2

## 2021-08-17 NOTE — Progress Notes (Signed)
Patient refused CPAP. No unit in room at this time. 

## 2021-08-17 NOTE — TOC Progression Note (Signed)
°  Transition of Care [facility]) Screening Note   Patient Details  Name: NEIMAN ROOTS Date of Birth: 18-Oct-1955   Transition of Care [facility]) CM/SW Contact:    Shade Flood, LCSW Phone Number: 08/17/2021, 10:23 AM    Transition of Care Department [facility]) has reviewed patient and no TOC needs have been identified at this time. We will continue to monitor patient advancement through interdisciplinary progression rounds. If new patient transition needs arise, please place a TOC consult.

## 2021-08-17 NOTE — Progress Notes (Signed)
PROGRESS NOTE    [name]  PJA:[id number] DOB: 1956/03/01 DOA: 08/16/2021 PCP: [name], MD   Brief Narrative:   [name] is a 66 y.o. male with medical history significant for COPD, prior DVTs, hyperlipidemia who presents to the emergency department due to progressive shortness of breath that has been ongoing for about 1 month.  He is noted to have acute hypoxemic respiratory failure in the setting of bilateral PE, but with no signs of RV strain on CT scan.  He has been started on IV heparin drip and ultrasound of the lower extremities also demonstrate DVT.  Assessment & Plan:   Principal Problem:   Pulmonary embolism ([name]) Active Problems:   Thrombocytopenia ([name])   Obstructive sleep apnea treated with continuous positive airway pressure (CPAP)   Chronic obstructive lung disease ([name])   Acute respiratory failure with hypoxia ([name])   Hyperglycemia   Hypokalemia   History of DVT (deep vein thrombosis)   Mixed hyperlipidemia   Acute hypoxemic respiratory failure secondary to bilateral PE/DVT -Continue IV heparin -2D echocardiogram pending -Ultrasound of lower extremities with bilateral DVT -Plan to transition to DOAC in a.m. if vitals remained stable and patient has been weaned off oxygen and no RV strain noted on echocardiogram -Noted to have history of DVT -Okay to transfer to telemetry  COPD -Continue breathing treatments as needed  History of CAD -Recent cardiac catheterization 08/01/2021 with severe LAD stenosis noted -Holding home aspirin due to initiation of anticoagulant -Continue statin  Thrombocytopenia -Monitor closely on heparin drip  Mixed hyperlipidemia -Crestor  OSA on CPAP -Continue nightly CPAP   DVT prophylaxis: Heparin drip Code Status: Full Family Communication: None at bedside Disposition Plan:  Status is: Inpatient  Remains inpatient appropriate because: IV medications    Consultants:  None  Procedures:  See  below  Antimicrobials:  None   Subjective: Patient seen and evaluated today with no new acute complaints or concerns.  He denies any chest pain or shortness of breath.  No acute concerns or events noted overnight.  Objective: Vitals:   08/17/21 0900 08/17/21 0909 08/17/21 1000 08/17/21 1105  BP: (!) 122/45  (!) 114/56 134/82  Pulse: 64  62 (!) 58  Resp: 20  17   Temp:    97.7 F (36.5 C)  TempSrc:    Oral  SpO2: 94% 95% 95% 93%  Weight:      Height:        Intake/Output Summary (Last 24 hours) at 08/17/2021 1111 Last data filed at 08/17/2021 1000 Gross per 24 hour  Intake 529.6 ml  Output 425 ml  Net 104.6 ml   Filed Weights   08/16/21 1737 08/17/21 0230  Weight: 106.6 kg 107 kg    Examination:  General exam: Appears calm and comfortable  Respiratory system: Clear to auscultation. Respiratory effort normal. Cardiovascular system: S1 & S2 heard, RRR.  Gastrointestinal system: Abdomen is soft Central nervous system: Alert and awake Extremities: No edema Skin: No significant lesions noted Psychiatry: Flat affect.    Data Reviewed: I have personally reviewed following labs and imaging studies  CBC: Recent Labs  Lab 08/16/21 1750 08/17/21 0554  WBC 9.3 9.1  HGB 14.0 14.5  HCT 40.3 41.1  MCV 101.3* 103.3*  PLT 119* 341*   Basic Metabolic Panel: Recent Labs  Lab 08/16/21 1750 08/17/21 0554  NA 135 139  K 3.4* 4.0  CL 102 105  CO2 24 26  GLUCOSE 132* 99  BUN 9 10  CREATININE 0.97 0.88  CALCIUM 9.0 9.1  MG 1.7 1.8  PHOS  --  2.9   GFR: Estimated Creatinine Clearance: 108.8 mL/min (by C-G formula based on SCr of 0.88 mg/dL). Liver Function Tests: Recent Labs  Lab 08/17/21 0554  AST 55*  ALT 46*  ALKPHOS 68  BILITOT 0.7  PROT 6.2*  ALBUMIN 3.3*   No results for input(s): LIPASE, AMYLASE in the last 168 hours. No results for input(s): AMMONIA in the last 168 hours. Coagulation Profile: Recent Labs  Lab 08/16/21 1750  INR 1.2    Cardiac Enzymes: No results for input(s): CKTOTAL, CKMB, CKMBINDEX, TROPONINI in the last 168 hours. BNP (last 3 results) No results for input(s): PROBNP in the last 8760 hours. HbA1C: No results for input(s): HGBA1C in the last 72 hours. CBG: No results for input(s): GLUCAP in the last 168 hours. Lipid Profile: No results for input(s): CHOL, HDL, LDLCALC, TRIG, CHOLHDL, LDLDIRECT in the last 72 hours. Thyroid Function Tests: No results for input(s): TSH, T4TOTAL, FREET4, T3FREE, THYROIDAB in the last 72 hours. Anemia Panel: Recent Labs    08/17/21 0554  VITAMINB12 271  FOLATE 7.9   Sepsis Labs: No results for input(s): PROCALCITON, LATICACIDVEN in the last 168 hours.  No results found for this or any previous visit (from the past 240 hour(s)).       Radiology Studies: CT Angio Chest Pulmonary Embolism (PE) W or WO Contrast  Result Date: 08/16/2021 CLINICAL DATA:  Dyspnea on exertion, hypoxemia for 1 month, with progressive worsening EXAM: CT ANGIOGRAPHY CHEST WITH CONTRAST TECHNIQUE: Multidetector CT imaging of the chest was performed using the standard protocol during bolus administration of intravenous contrast. Multiplanar CT image reconstructions and MIPs were obtained to evaluate the vascular anatomy. RADIATION DOSE REDUCTION: This exam was performed according to the departmental dose-optimization program which includes automated exposure control, adjustment of the mA and/or kV according to patient size and/or use of iterative reconstruction technique. CONTRAST:  20mL OMNIPAQUE IOHEXOL 350 MG/ML SOLN COMPARISON:  07/24/2021, 06/07/2021 FINDINGS: Cardiovascular: This is a technically adequate evaluation of the pulmonary vasculature. There are multiple bilateral central and segmental pulmonary emboli, right greater than left. There is no definitive evidence of right heart strain, with equivocal straightening of the interventricular septum but no significant increase in the  RV/LV ratio, which measures approximately 0.8. No pericardial effusion. No evidence of thoracic aortic aneurysm or dissection. Mild atherosclerosis of the aortic arch, with severe coronary artery atherosclerosis identified. Mediastinum/Nodes: No enlarged mediastinal, hilar, or axillary lymph nodes. Thyroid gland, trachea, and esophagus demonstrate no significant findings. Lungs/Pleura: Stable upper lobe predominant emphysema. No airspace disease, effusion, or pneumothorax. Central airways are patent. Upper Abdomen: Diffuse hepatic steatosis. Likely focal fatty sparing within the left lobe liver. No acute upper abdominal process. Musculoskeletal: No acute or destructive bony lesions. Reconstructed images demonstrate no additional findings. Review of the MIP images confirms the above findings. IMPRESSION: 1. Bilateral central and segmental pulmonary emboli. No definitive evidence of right heart strain despite moderate clot burden, with RV/LV ratio measuring approximately 0.8 and equivocal straightening of the interventricular septum. 2. Aortic Atherosclerosis (ICD10-I70.0) and Emphysema (ICD10-J43.9). 3. Hepatic steatosis. Critical Value/emergent results were called by telephone at the time of interpretation on 08/16/2021 at 5:11 pm to provider Leslie Andrea , who verbally acknowledged these results. Electronically Signed   By: Randa Ngo M.D.   On: 08/16/2021 17:14   US Venous Img Lower Bilateral (DVT)  Result Date: 08/17/2021 CLINICAL DATA:  Prior history of  DVT and PE EXAM: Bilateral LOWER EXTREMITY VENOUS DOPPLER ULTRASOUND TECHNIQUE: Gray-scale sonography with compression, as well as color and duplex ultrasound, were performed to evaluate the deep venous system(s) from the level of the common femoral vein through the popliteal and proximal calf veins. COMPARISON:  01/23/2021 FINDINGS: Nonocclusive deep venous thrombosis is seen in the right popliteal, left popliteal, left peroneal and left posterior  tibial veins. Rest of the major deep veins in both lower extremities appear patent. OTHER None. Limitations: none IMPRESSION: Findings are consistent with deep venous thrombosis in the right popliteal, left popliteal, left posterior tibial and left peroneal veins. Electronically Signed   By: Elmer Picker M.D.   On: 08/17/2021 10:42        Scheduled Meds:  Chlorhexidine Gluconate Cloth  6 each Topical Q0600   fluticasone furoate-vilanterol  1 puff Inhalation Daily   rosuvastatin  20 mg Oral Daily   umeclidinium bromide  1 puff Inhalation Daily   Continuous Infusions:  heparin 1,600 Units/hr (08/17/21 0649)     LOS: 1 day    Time spent: 35 minutes    Temisha Murley Darleen Crocker, DO [facility]  If 7PM-7AM, please contact night-coverage [email or web address] 08/17/2021, 11:11 AM

## 2021-08-17 NOTE — Progress Notes (Signed)
ANTICOAGULATION CONSULT NOTE -   Pharmacy Consult for Heparin Indication: pulmonary embolus  No Known Allergies  Patient Measurements: Height: 6\' 1"  (185.4 cm) Weight: 107 kg (235 lb 14.3 oz) IBW/kg (Calculated) : 79.9 Heparin Dosing Weight: 100 kg  Vital Signs: Temp: 97.8 F (36.6 C) (01/14 0807) Temp Source: Oral (01/14 0807) BP: 122/45 (01/14 0900) Pulse Rate: 64 (01/14 0900)  Labs: Recent Labs    08/16/21 1750 08/17/21 0159 08/17/21 0554 08/17/21 0923  HGB 14.0  --  14.5  --   HCT 40.3  --  41.1  --   PLT 119*  --  115*  --   APTT 25  --  108*  --   LABPROT 15.0  --   --   --   INR 1.2  --   --   --   HEPARINUNFRC  --  0.50  --  0.39  CREATININE 0.97  --  0.88  --      Estimated Creatinine Clearance: 108.8 mL/min (by C-G formula based on SCr of 0.88 mg/dL).   Medical History: Past Medical History:  Diagnosis Date   Bilateral shoulder bursitis    Chronic back pain    COPD (chronic obstructive pulmonary disease) with emphysema ([name])    DDD (degenerative disc disease), lumbar    hx epideral injection  L5--S1    Diverticulitis    DVT (deep venous thrombosis) ([place]) 11/19/2018   Dysphasia    intermittant w/ food    Dyspnea    Dyspnea on exertion    GERD (gastroesophageal reflux disease)    Gout    L great toe   Heterozygous factor V Leiden mutation ([place])    Hiatal hernia    Hip problem 2020   left, seeing orthopedics   History of acute pancreatitis    alcoholic pancreatitis [id number] and 01-30-2012   History of colon polyps    10-17-2005  hyperplastic polyp's   History of esophageal stricture    s/p  dilation 02-02-2017   History of peptic ulcer 1990s   History of pneumococcal pneumonia 2006   bilateral pneumonia w/ left lower lobe abscess treated w/ chest tube with suction for drainage   History of pneumothorax    1984--  left spontaneous pneumothorax ,  treated w/ chest tube   Malignant neoplasm of prostate 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 w/ neurologist   OSA (obstructive sleep apnea)    per pt has not used cpap over year ago from 04-16-2017--- per study 01-17-2010  severe osa   Solid nodule of lung greater than 8 mm in diameter 05/11/2018   03/2018: RUL Nodule, 8mm   Wears glasses    Assessment:  66 yr old male to begin IV heparin for bilateral PE per CTA, no definitive evidence of right heart strain.  Hx heterozygous Factor V Leiden mutation and prior DVTs. Last 10/2020, off anticoagulants since 02/2021.  Prior instances noted weakly provoked and repeat hypercoagulation panel negative.   HL 0.39- therapeutic  Goal of Therapy:  Heparin level 0.3-0.7 units/ml Monitor platelets by anticoagulation protocol: Yes   Plan:  Continue heparin drip at 1600 units/hr Heparin level daily Continue to monitor H&H and platelets. F/U transition to PO anticoagulation.  Margot Ables, PharmD Clinical Pharmacist 08/17/2021 10:09 AM

## 2021-08-17 NOTE — Progress Notes (Signed)
[place] for Heparin Indication: pulmonary embolus Brief A/P: Heparin level within goal range Continue Heparin at current rate   No Known Allergies  Patient Measurements: Height: 6\' 1"  (185.4 cm) Weight: 106.6 kg (235 lb) IBW/kg (Calculated) : 79.9 Heparin Dosing Weight: 100 kg  Vital Signs: Temp: 98.8 F (37.1 C) (01/13 1747) Temp Source: Oral (01/13 1747) BP: 103/72 (01/14 0100) Pulse Rate: 65 (01/14 0100)  Labs: Recent Labs    08/16/21 1750 08/17/21 0159  HGB 14.0  --   HCT 40.3  --   PLT 119*  --   APTT 25  --   LABPROT 15.0  --   INR 1.2  --   HEPARINUNFRC  --  0.50  CREATININE 0.97  --      Estimated Creatinine Clearance: 98.6 mL/min (by C-G formula based on SCr of 0.97 mg/dL).  Assessment:  66 y.o. male with PE for heparin  Goal of Therapy:  Heparin level 0.3-0.7 units/ml Monitor platelets by anticoagulation protocol: Yes   Plan:  Continue Heparin at current rate  Check heparin level in 8 hours to verify  [name], [name] 08/17/2021,2:22 AM

## 2021-08-17 NOTE — Progress Notes (Signed)
Called for pt report, nurse advises pt is currently having echocardiogram performed. Will return call once procedure completed.

## 2021-08-17 NOTE — Progress Notes (Signed)
°  Echocardiogram 2D Echocardiogram has been performed.  [name] 08/17/2021, 10:49 AM

## 2021-08-17 NOTE — Plan of Care (Signed)

## 2021-08-18 LAB — CBC
HCT: 40.8 % (ref 39.0–52.0)
Hemoglobin: 14 g/dL (ref 13.0–17.0)
MCH: 35.6 pg — ABNORMAL HIGH (ref 26.0–34.0)
MCHC: 34.3 g/dL (ref 30.0–36.0)
MCV: 103.8 fL — ABNORMAL HIGH (ref 80.0–100.0)
Platelets: 127 10*3/uL — ABNORMAL LOW (ref 150–400)
RBC: 3.93 MIL/uL — ABNORMAL LOW (ref 4.22–5.81)
RDW: 12.9 % (ref 11.5–15.5)
WBC: 8.1 10*3/uL (ref 4.0–10.5)
nRBC: 0 % (ref 0.0–0.2)

## 2021-08-18 LAB — MAGNESIUM: Magnesium: 1.9 mg/dL (ref 1.7–2.4)

## 2021-08-18 LAB — COMPREHENSIVE METABOLIC PANEL
ALT: 43 U/L (ref 0–44)
AST: 51 U/L — ABNORMAL HIGH (ref 15–41)
Albumin: 3.1 g/dL — ABNORMAL LOW (ref 3.5–5.0)
Alkaline Phosphatase: 68 U/L (ref 38–126)
Anion gap: 6 (ref 5–15)
BUN: 9 mg/dL (ref 8–23)
CO2: 25 mmol/L (ref 22–32)
Calcium: 8.8 mg/dL — ABNORMAL LOW (ref 8.9–10.3)
Chloride: 106 mmol/L (ref 98–111)
Creatinine, Ser: 0.78 mg/dL (ref 0.61–1.24)
GFR, Estimated: >60 mL/min (ref >60)
Glucose, Bld: 97 mg/dL (ref 70–99)
Potassium: 3.9 mmol/L (ref 3.5–5.1)
Sodium: 137 mmol/L (ref 135–145)
Total Bilirubin: 0.4 mg/dL (ref 0.3–1.2)
Total Protein: 6 g/dL — ABNORMAL LOW (ref 6.5–8.1)

## 2021-08-18 LAB — HEPARIN LEVEL (UNFRACTIONATED): Heparin Unfractionated: 0.38 IU/mL (ref 0.30–0.70)

## 2021-08-18 MED ORDER — APIXABAN 5 MG PO TABS: 5.0000 mg | ORAL_TABLET | Freq: Two times a day (BID) | ORAL | 3 refills | Status: DC

## 2021-08-18 MED ORDER — APIXABAN 5 MG PO TABS: 10.0000 mg | ORAL_TABLET | Freq: Two times a day (BID) | ORAL | 0 refills | Status: DC

## 2021-08-18 MED ORDER — APIXABAN 5 MG PO TABS
10.0000 mg | ORAL_TABLET | Freq: Two times a day (BID) | ORAL | Status: DC
  Administered 2021-08-18: 10 mg via ORAL
  Filled 2021-08-18: qty 2

## 2021-08-18 MED ORDER — APIXABAN 5 MG PO TABS: 5.0000 mg | ORAL_TABLET | Freq: Two times a day (BID) | ORAL | Status: DC

## 2021-08-18 NOTE — Progress Notes (Signed)
Patient refused his CPAP. Stated he prefers to keep the nasal cannula on instead.

## 2021-08-18 NOTE — Progress Notes (Addendum)
ANTICOAGULATION CONSULT NOTE -   Pharmacy Consult for Heparin>> apixaban Indication: pulmonary embolus  No Known Allergies  Patient Measurements: Height: 6\' 1"  (185.4 cm) Weight: 107 kg (235 lb 14.3 oz) IBW/kg (Calculated) : 79.9 Heparin Dosing Weight: 100 kg  Vital Signs: Temp: 98 F (36.7 C) (01/15 0541) Temp Source: Oral (01/15 0541) BP: 121/88 (01/15 0541) Pulse Rate: 71 (01/15 0541)  Labs: Recent Labs    08/16/21 1750 08/17/21 0159 08/17/21 0554 08/17/21 0923 08/18/21 0432  HGB 14.0  --  14.5  --  14.0  HCT 40.3  --  41.1  --  40.8  PLT 119*  --  115*  --  127*  APTT 25  --  108*  --   --   LABPROT 15.0  --   --   --   --   INR 1.2  --   --   --   --   HEPARINUNFRC  --  0.50  --  0.39 0.38  CREATININE 0.97  --  0.88  --  0.78     Estimated Creatinine Clearance: 119.7 mL/min (by C-G formula based on SCr of 0.78 mg/dL).   Medical History: Past Medical History:  Diagnosis Date   Bilateral shoulder bursitis    Chronic back pain    COPD (chronic obstructive pulmonary disease) with emphysema ([name])    DDD (degenerative disc disease), lumbar    hx epideral injection  L5--S1    Diverticulitis    DVT (deep venous thrombosis) ([place]) 11/19/2018   Dysphasia    intermittant w/ food    Dyspnea    Dyspnea on exertion    GERD (gastroesophageal reflux disease)    Gout    L great toe   Heterozygous factor V Leiden mutation ([place])    Hiatal hernia    Hip problem 2020   left, seeing orthopedics   History of acute pancreatitis    alcoholic pancreatitis [id number] and 01-30-2012   History of colon polyps    10-17-2005  hyperplastic polyp's   History of esophageal stricture    s/p  dilation 02-02-2017   History of peptic ulcer 1990s   History of pneumococcal pneumonia 2006   bilateral pneumonia w/ left lower lobe abscess treated w/ chest tube with suction for drainage   History of pneumothorax    1984--  left spontaneous pneumothorax ,  treated w/ chest tube    Malignant neoplasm of prostate [facility]) urologist-  dr dahlstedt/  oncologist -- dr Tammi Klippel   dx 12-02-2016-- Stage T2b, Gleason 3+4,  PSA 5.99,  vol 25cc   Numbness of left foot    outer left ankle numb-- per pt has appt. w/ neurologist   OSA (obstructive sleep apnea)    per pt has not used cpap over year ago from 04-16-2017--- per study 01-17-2010  severe osa   Solid nodule of lung greater than 8 mm in diameter 05/11/2018   03/2018: RUL Nodule, 87mm   Wears glasses    Assessment:  66 yr old male to begin IV heparin for bilateral PE per CTA, no definitive evidence of right heart strain.  Hx heterozygous Factor V Leiden mutation and prior DVTs. Last 10/2020, off anticoagulants since 02/2021.  Prior instances noted weakly provoked and repeat hypercoagulation panel negative.     Goal of Therapy:  Heparin level 0.3-0.7 units/ml Monitor platelets by anticoagulation protocol: Yes   Plan:  Stop heparin infusion Start apixaban 10 mg twice daily x 7 days followed by apixaban 5 mg  twice daily. Continue to monitor H&H and platelets.   Margot Ables, PharmD Clinical Pharmacist 08/18/2021 7:36 AM

## 2021-08-18 NOTE — Discharge Summary (Signed)
Physician Discharge Summary  [name] LGX:[id number] DOB: [birth year] DOA: 08/16/2021  PCP: [name], MD  Admit date: 08/16/2021  Discharge date: 08/18/2021  Admitted From:Home  Disposition:  Home  Recommendations for Outpatient Follow-up:  Follow up with PCP in 1-2 weeks Continue on Eliquis as prescribed indefinitely Discontinue ASA Continue other home medications as prior  Home Health:None  Equipment/Devices:None  Discharge Condition:Stable  CODE STATUS: Full  Diet recommendation: Heart Healthy  Brief/Interim Summary: [name] is a 66 y.o. male with medical history significant for COPD, prior DVTs, hyperlipidemia who presents to the emergency department due to progressive shortness of breath that has been ongoing for about 1 month.  He is noted to have acute hypoxemic respiratory failure in the setting of bilateral PE, but with no signs of RV strain on CT scan.  He has been started on IV heparin drip and ultrasound of the lower extremities also demonstrate bilateral DVTs.  His 2D echocardiogram is as noted below and with no RV strain present.  His vital signs of remained stable and he no longer requires oxygen supplementation.  He has no significant shortness of breath or symptoms with ambulation.  He is currently in stable condition for discharge to home with Eliquis as prescribed.  No other significant concerns or acute events noted throughout the course of this brief admission.  He will have close follow-up with his PCP as noted above.  Discharge Diagnoses:  Principal Problem:   Pulmonary embolism ([place]) Active Problems:   Thrombocytopenia ([name])   Obstructive sleep apnea treated with continuous positive airway pressure (CPAP)   Chronic obstructive lung disease ([name])   Acute respiratory failure with hypoxia ([name])   Hyperglycemia   Hypokalemia   History of DVT (deep vein thrombosis)   Mixed hyperlipidemia  Principal discharge diagnosis: Acute hypoxemic  respiratory failure secondary to bilateral PE/DVT.  Discharge Instructions  Discharge Instructions     Diet - low sodium heart healthy   Complete by: As directed    Increase activity slowly   Complete by: As directed       Allergies as of 08/18/2021   No Known Allergies      Medication List     STOP taking these medications    aspirin EC 81 MG tablet   doxycycline 50 MG capsule Commonly known as: VIBRAMYCIN   multivitamin with minerals tablet   multivitamin with minerals Tabs tablet       TAKE these medications    acetaminophen 500 MG tablet Commonly known as: TYLENOL Take 2 tablets (1,000 mg total) by mouth every 6 (six) hours as needed for mild pain.   albuterol 108 (90 Base) MCG/ACT inhaler Commonly known as: VENTOLIN HFA Inhale 2 puffs into the lungs every 6 (six) hours as needed for wheezing or shortness of breath.   albuterol (2.5 MG/3ML) 0.083% nebulizer solution Commonly known as: PROVENTIL Take 2.5 mg by nebulization every 6 (six) hours as needed for wheezing or shortness of breath.   allopurinol 300 MG tablet Commonly known as: ZYLOPRIM Take 300 mg by mouth daily.   apixaban 5 MG Tabs tablet Commonly known as: ELIQUIS Take 2 tablets (10 mg total) by mouth 2 (two) times daily for 7 days.   apixaban 5 MG Tabs tablet Commonly known as: ELIQUIS Take 1 tablet (5 mg total) by mouth 2 (two) times daily. Start taking on: August 25, 2021   ibuprofen 600 MG tablet Commonly known as: ADVIL Take 600 mg by mouth 2 (two) times  daily as needed for pain.   metoprolol succinate 25 MG 24 hr tablet Commonly known as: Toprol XL Take 1 tablet (25 mg total) by mouth daily.   psyllium 28 % packet Commonly known as: METAMUCIL SMOOTH TEXTURE Take 1 packet by mouth daily.   rosuvastatin 20 MG tablet Commonly known as: CRESTOR Take 1 tablet (20 mg total) by mouth daily.   SOOTHE XP OP Place 1 drop into both eyes daily as needed (Dry eyes).   Trelegy  Ellipta 100-62.5-25 MCG/ACT Aepb Generic drug: Fluticasone-Umeclidin-Vilant Inhale 1 puff into the lungs daily.        Follow-up Information     [name], MD. Schedule an appointment as soon as possible for a visit in 1 week(s).   Specialty: Family Medicine Contact information: [address] [place] [address] [phone]                No Known Allergies  Consultations: None   Procedures/Studies: CT Angio Chest Pulmonary Embolism (PE) W or WO Contrast  Result Date: 08/16/2021 CLINICAL DATA:  Dyspnea on exertion, hypoxemia for 1 month, with progressive worsening EXAM: CT ANGIOGRAPHY CHEST WITH CONTRAST TECHNIQUE: Multidetector CT imaging of the chest was performed using the standard protocol during bolus administration of intravenous contrast. Multiplanar CT image reconstructions and MIPs were obtained to evaluate the vascular anatomy. RADIATION DOSE REDUCTION: This exam was performed according to the departmental dose-optimization program which includes automated exposure control, adjustment of the mA and/or kV according to patient size and/or use of iterative reconstruction technique. CONTRAST:  70mL OMNIPAQUE IOHEXOL 350 MG/ML SOLN COMPARISON:  07/24/2021, 06/07/2021 FINDINGS: Cardiovascular: This is a technically adequate evaluation of the pulmonary vasculature. There are multiple bilateral central and segmental pulmonary emboli, right greater than left. There is no definitive evidence of right heart strain, with equivocal straightening of the interventricular septum but no significant increase in the RV/LV ratio, which measures approximately 0.8. No pericardial effusion. No evidence of thoracic aortic aneurysm or dissection. Mild atherosclerosis of the aortic arch, with severe coronary artery atherosclerosis identified. Mediastinum/Nodes: No enlarged mediastinal, hilar, or axillary lymph nodes. Thyroid gland, trachea, and esophagus demonstrate no significant  findings. Lungs/Pleura: Stable upper lobe predominant emphysema. No airspace disease, effusion, or pneumothorax. Central airways are patent. Upper Abdomen: Diffuse hepatic steatosis. Likely focal fatty sparing within the left lobe liver. No acute upper abdominal process. Musculoskeletal: No acute or destructive bony lesions. Reconstructed images demonstrate no additional findings. Review of the MIP images confirms the above findings. IMPRESSION: 1. Bilateral central and segmental pulmonary emboli. No definitive evidence of right heart strain despite moderate clot burden, with RV/LV ratio measuring approximately 0.8 and equivocal straightening of the interventricular septum. 2. Aortic Atherosclerosis (ICD10-I70.0) and Emphysema (ICD10-J43.9). 3. Hepatic steatosis. Critical Value/emergent results were called by telephone at the time of interpretation on 08/16/2021 at 5:11 pm to provider Leslie Andrea , who verbally acknowledged these results. Electronically Signed   By: Randa Ngo M.D.   On: 08/16/2021 17:14   CARDIAC CATHETERIZATION  Result Date: 08/01/2021   Prox RCA to Mid RCA lesion is 25% stenosed.   Ost RCA to Prox RCA lesion is 10% stenosed.   Prox Cx to Mid Cx lesion is 25% stenosed.   Prox LAD lesion is 50% stenosed.   Mid LAD lesion is 30% stenosed.   The left ventricular systolic function is normal.   LV end diastolic pressure is normal.   The left ventricular ejection fraction is 55-65% by visual estimate.  There is no mitral valve regurgitation. Moderate, calcified proximal LAD stenosis. RFR 0.97-0.96 suggesting this lesion is not flow limiting. Mild mid LAD stenosis. Large caliber Circumflex artery with mild mid stenosis Large dominant RCA with mild, calcified proximal and mid vessel stenosis. Normal LVEDP Normal LV systolic function, LGXQ=11% Recommendations: medical management of non-obstructive CAD. His proximal LAD lesion is not flow limiting by pressure wire analysis (RFR 0.96-0.97).  Continue ASA and statin. His dyspnea is likely related to his chronic lung disease.   US Venous Img Lower Bilateral (DVT)  Result Date: 08/17/2021 CLINICAL DATA:  Prior history of DVT and PE EXAM: Bilateral LOWER EXTREMITY VENOUS DOPPLER ULTRASOUND TECHNIQUE: Gray-scale sonography with compression, as well as color and duplex ultrasound, were performed to evaluate the deep venous system(s) from the level of the common femoral vein through the popliteal and proximal calf veins. COMPARISON:  01/23/2021 FINDINGS: Nonocclusive deep venous thrombosis is seen in the right popliteal, left popliteal, left peroneal and left posterior tibial veins. Rest of the major deep veins in both lower extremities appear patent. OTHER None. Limitations: none IMPRESSION: Findings are consistent with deep venous thrombosis in the right popliteal, left popliteal, left posterior tibial and left peroneal veins. Electronically Signed   By: Elmer Picker M.D.   On: 08/17/2021 10:42   CT CORONARY MORPH W/CTA COR W/SCORE W/CA W/CM &/OR WO/CM  Addendum Date: 07/25/2021   ADDENDUM REPORT: 07/25/2021 12:44 CLINICAL DATA:  cp EXAM: Cardiac/Coronary  CTA TECHNIQUE: The patient was scanned on a [employer]. FINDINGS: A 100 kV prospective scan was triggered in the descending thoracic aorta at 111 HU's. Axial non-contrast 3 mm slices were carried out through the heart. The data set was analyzed on a dedicated work station and scored using the [place]. Gantry rotation speed was 250 msecs and collimation was .6 mm. No beta blockade and 0.8 mg of sl NTG was given. The 3D data set was reconstructed in 5% intervals of the 67-82 % of the R-R cycle. Diastolic phases were analyzed on a dedicated work station using MPR, MIP and VRT modes. The patient received 80 cc of contrast. Aorta:  Normal size.  Mild calcifications.  No dissection. Aortic Valve:  Trileaflet.  No calcifications. Coronary Arteries:  Normal coronary origin.   Right dominance. RCA is a large dominant artery that gives rise to PDA and PLA. Proximal portion has numerous calcified, mild (25-49% stenosis) plaques. Mid portion of RCA has minimal, calcified (0-25% stenosis) plaques. Large PDA has soft plaque, causing moderate stenosis of 50-69% . Left main is a large artery that gives rise to LAD and LCX arteries as well as small intermediate branch. LM has calcified minimal plaques with 0-25% stenosis. LAD is a large vessel that has multiple mixed plaques in the proximal portion with severe stenosis (70-99%). In the mid portion of this artery mostly calcified plaques are noted with moderate 50-69% stenosis. Distal portion of LAD has moderate 50-69% stenosis. LAD gives rise to moderate D1 and large D2. LCX is a non-dominant artery that gives rise to one small OM1 and large OM2 branches. Proximal portion of CX has mild, calcified plaque with 25-49% stenosis. Other findings: Normal pulmonary vein drainage into the left atrium. Normal left atrial appendage without a thrombus. Normal size of the pulmonary artery. IMPRESSION: 1. Coronary calcium score of 2641. This was 1 percentile for age and sex matched control. 2. Normal coronary origin with right dominance. 3. CAD-RADS 4 Severe stenosis. (70-99% or > 50% left main).  Cardiac catheterization or CT FFR is recommended. Consider symptom-guided anti-ischemic pharmacotherapy as well as risk factor modification per guideline directed care. 4. Severe stenosis noted in proximal portion of LAD. Mixed plaque noted. Park Liter, MD Electronically Signed   By: Jenne Campus M.D.   On: 07/25/2021 12:44   Result Date: 07/25/2021 EXAM: OVER-READ INTERPRETATION  CT CHEST The following report is an over-read performed by radiologist Dr. Aletta Edouard of [facility], [place] on 07/24/2021. This over-read does not include interpretation of cardiac or coronary anatomy or pathology. The coronary CTA interpretation by the  cardiologist is attached. COMPARISON:  Prior CT of the chest on 06/07/2021 FINDINGS: Vascular: No significant noncardiac vascular findings. Mediastinum/Nodes: Proliferation of mediastinal fat (mediastinal lipomatosis) without evidence of lymphadenopathy or masses in the visualized mediastinum or hilar regions. Lungs/Pleura: Stable emphysematous lung disease. Visualized lungs show no evidence of pulmonary edema, consolidation, pneumothorax, nodule or pleural fluid. Upper Abdomen: Stable evidence of diffuse hepatic steatosis. Musculoskeletal: No chest wall mass or suspicious bone lesions identified. IMPRESSION: 1. Stable emphysematous lung disease. 2. Evidence of mediastinal lipomatosis. 3. Stable diffuse hepatic steatosis. Electronically Signed: By: Aletta Edouard M.D. On: 07/24/2021 09:39   ECHOCARDIOGRAM COMPLETE  Result Date: 08/17/2021    ECHOCARDIOGRAM REPORT   Patient Name:   [name] Date of Exam: 08/17/2021 Medical Rec #:  [id number]        Height:       73.0 in Accession #:    [id number]       Weight:       235.9 lb Date of Birth:  March 05, 1956        BSA:          2.308 m Patient Age:    66 years         BP:           122/45 mmHg Patient Gender: M                HR:           62 bpm. Exam Location:  Inpatient Procedure: 2D Echo, Cardiac Doppler and Color Doppler Indications:    I26.02 Pulmonary embolus  History:        Patient has prior history of Echocardiogram examinations, most                 recent 03/28/2009. CAD, COPD, Signs/Symptoms:Shortness of Breath                 and Dyspnea; Risk Factors:Sleep Apnea and Dyslipidemia. ETOH.                 Pneumothorax.  Sonographer:    Roseanna Rainbow RDCS Referring Phys: [id number] OLADAPO ADEFESO  Sonographer Comments: Technically difficult study due to poor echo windows. Image acquisition challenging due to patient body habitus. IMPRESSIONS  1. Left ventricular ejection fraction, by estimation, is 60 to 65%. The left ventricle has normal function. The left  ventricle has no regional wall motion abnormalities. There is moderate concentric left ventricular hypertrophy. Left ventricular diastolic parameters are consistent with Grade I diastolic dysfunction (impaired relaxation).  2. Right ventricular systolic function is normal. The right ventricular size is normal. Tricuspid regurgitation signal is inadequate for assessing PA pressure.  3. The mitral valve is normal in structure. No evidence of mitral valve regurgitation. No evidence of mitral stenosis.  4. The aortic valve is tricuspid. Aortic valve regurgitation is not visualized. No aortic stenosis is present.  5. The inferior vena  cava is normal in size with greater than 50% respiratory variability, suggesting right atrial pressure of 3 mmHg.  6. Prominent pericardial fat. FINDINGS  Left Ventricle: Left ventricular ejection fraction, by estimation, is 60 to 65%. The left ventricle has normal function. The left ventricle has no regional wall motion abnormalities. The left ventricular internal cavity size was normal in size. There is  moderate concentric left ventricular hypertrophy. Left ventricular diastolic parameters are consistent with Grade I diastolic dysfunction (impaired relaxation). Right Ventricle: The right ventricular size is normal. No increase in right ventricular wall thickness. Right ventricular systolic function is normal. Tricuspid regurgitation signal is inadequate for assessing PA pressure. Left Atrium: Left atrial size was normal in size. Right Atrium: Right atrial size was normal in size. Pericardium: Trivial pericardial effusion is present. Presence of epicardial fat layer. Mitral Valve: The mitral valve is normal in structure. No evidence of mitral valve regurgitation. No evidence of mitral valve stenosis. Tricuspid Valve: The tricuspid valve is normal in structure. Tricuspid valve regurgitation is not demonstrated. No evidence of tricuspid stenosis. Aortic Valve: The aortic valve is tricuspid.  Aortic valve regurgitation is not visualized. No aortic stenosis is present. Aortic valve mean gradient measures 3.0 mmHg. Aortic valve peak gradient measures 5.7 mmHg. Aortic valve area, by VTI measures 3.42 cm. Pulmonic Valve: The pulmonic valve was not well visualized. Pulmonic valve regurgitation is trivial. No evidence of pulmonic stenosis. Aorta: The aortic root and ascending aorta are structurally normal, with no evidence of dilitation. Venous: The inferior vena cava is normal in size with greater than 50% respiratory variability, suggesting right atrial pressure of 3 mmHg. IAS/Shunts: No atrial level shunt detected by color flow Doppler.  LEFT VENTRICLE PLAX 2D LVIDd:         5.35 cm     Diastology LVIDs:         3.40 cm     LV e' medial:    5.55 cm/s LV PW:         1.35 cm     LV E/e' medial:  14.1 LV IVS:        1.45 cm     LV e' lateral:   7.29 cm/s LVOT diam:     2.30 cm     LV E/e' lateral: 10.8 LV SV:         84 LV SV Index:   36 LVOT Area:     4.15 cm  LV Volumes (MOD) LV vol d, MOD A2C: 60.5 ml LV vol d, MOD A4C: 64.4 ml LV vol s, MOD A2C: 19.9 ml LV vol s, MOD A4C: 19.0 ml LV SV MOD A2C:     40.6 ml LV SV MOD A4C:     64.4 ml LV SV MOD BP:      43.0 ml RIGHT VENTRICLE            IVC RV S prime:     8.81 cm/s  IVC diam: 1.90 cm TAPSE (M-mode): 2.3 cm LEFT ATRIUM             Index        RIGHT ATRIUM           Index LA diam:        3.80 cm 1.65 cm/m   RA Area:     15.50 cm LA Vol (A2C):   19.1 ml 8.28 ml/m   RA Volume:   42.50 ml  18.41 ml/m LA Vol (A4C):   29.4 ml 12.74 ml/m  LA Biplane Vol: 24.1 ml 10.44 ml/m  AORTIC VALVE                    PULMONIC VALVE AV Area (Vmax):    3.56 cm     PR End Diast Vel: 1.47 msec AV Area (Vmean):   3.54 cm AV Area (VTI):     3.42 cm AV Vmax:           119.00 cm/s AV Vmean:          75.800 cm/s AV VTI:            0.244 m AV Peak Grad:      5.7 mmHg AV Mean Grad:      3.0 mmHg LVOT Vmax:         102.00 cm/s LVOT Vmean:        64.500 cm/s LVOT VTI:           0.201 m LVOT/AV VTI ratio: 0.82  AORTA Ao Root diam: 3.70 cm Ao Asc diam:  3.90 cm MITRAL VALVE MV Area (PHT): 3.37 cm    SHUNTS MV Decel Time: 225 msec    Systemic VTI:  0.20 m MV E velocity: 78.40 cm/s  Systemic Diam: 2.30 cm MV A velocity: 93.00 cm/s MV E/A ratio:  0.84 Rudean Haskell MD Electronically signed by Rudean Haskell MD Signature Date/Time: 08/17/2021/1:56:36 PM    Final      Discharge Exam: Vitals:   08/17/21 2158 08/18/21 0541  BP: 124/84 121/88  Pulse: 61 71  Resp: 17 18  Temp: 97.7 F (36.5 C) 98 F (36.7 C)  SpO2: 98% 96%   Vitals:   08/17/21 1404 08/17/21 1812 08/17/21 2158 08/18/21 0541  BP: 121/73 131/79 124/84 121/88  Pulse: 65 69 61 71  Resp: 18 16 17 18   Temp: 98 F (36.7 C) 98.5 F (36.9 C) 97.7 F (36.5 C) 98 F (36.7 C)  TempSrc:  Oral Oral Oral  SpO2: 95% 96% 98% 96%  Weight:      Height:        General: Pt is alert, awake, not in acute distress Cardiovascular: RRR, S1/S2 +, no rubs, no gallops Respiratory: CTA bilaterally, no wheezing, no rhonchi Abdominal: Soft, NT, ND, bowel sounds + Extremities: no edema, no cyanosis    The results of significant diagnostics from this hospitalization (including imaging, microbiology, ancillary and laboratory) are listed below for reference.     Microbiology: Recent Results (from the past 240 hour(s))  MRSA Next Gen by PCR, Nasal     Status: None   Collection Time: 08/17/21  1:39 AM   Specimen: Nasal Mucosa; Nasal Swab  Result Value Ref Range Status   MRSA by PCR Next Gen NOT DETECTED NOT DETECTED Final    Comment: (NOTE) The GeneXpert MRSA Assay (FDA approved for NASAL specimens only), is one component of a comprehensive MRSA colonization surveillance program. It is not intended to diagnose MRSA infection nor to guide or monitor treatment for MRSA infections. Test performance is not FDA approved in patients less than 80 years old. Performed at [facility], [address]  [place], [place] [address]      Labs: BNP (last 3 results) No results for input(s): BNP in the last 8760 hours. Basic Metabolic Panel: Recent Labs  Lab 08/16/21 1750 08/17/21 0554 08/18/21 0432  NA 135 139 137  K 3.4* 4.0 3.9  CL 102 105 106  CO2 24 26 25   GLUCOSE 132* 99 97  BUN 9 10 9   CREATININE 0.97 0.88 0.78  CALCIUM 9.0 9.1 8.8*  MG 1.7 1.8 1.9  PHOS  --  2.9  --    Liver Function Tests: Recent Labs  Lab 08/17/21 0554 08/18/21 0432  AST 55* 51*  ALT 46* 43  ALKPHOS 68 68  BILITOT 0.7 0.4  PROT 6.2* 6.0*  ALBUMIN 3.3* 3.1*   No results for input(s): LIPASE, AMYLASE in the last 168 hours. No results for input(s): AMMONIA in the last 168 hours. CBC: Recent Labs  Lab 08/16/21 1750 08/17/21 0554 08/18/21 0432  WBC 9.3 9.1 8.1  HGB 14.0 14.5 14.0  HCT 40.3 41.1 40.8  MCV 101.3* 103.3* 103.8*  PLT 119* 115* 127*   Cardiac Enzymes: No results for input(s): CKTOTAL, CKMB, CKMBINDEX, TROPONINI in the last 168 hours. BNP: Invalid input(s): POCBNP CBG: No results for input(s): GLUCAP in the last 168 hours. D-Dimer No results for input(s): DDIMER in the last 72 hours. Hgb A1c No results for input(s): HGBA1C in the last 72 hours. Lipid Profile No results for input(s): CHOL, HDL, LDLCALC, TRIG, CHOLHDL, LDLDIRECT in the last 72 hours. Thyroid function studies No results for input(s): TSH, T4TOTAL, T3FREE, THYROIDAB in the last 72 hours.  Invalid input(s): FREET3 Anemia work up Recent Labs    08/17/21 0554  VITAMINB12 271  FOLATE 7.9   Urinalysis    Component Value Date/Time   COLORURINE AMBER (A) 08/10/2020 2013   APPEARANCEUR HAZY (A) 08/10/2020 2013   LABSPEC 1.032 (H) 08/10/2020 2013   PHURINE 5.0 08/10/2020 2013   GLUCOSEU NEGATIVE 08/10/2020 2013   HGBUR NEGATIVE 08/10/2020 2013   BILIRUBINUR NEGATIVE 08/10/2020 2013   BILIRUBINUR neg 01/17/2020 [place] 08/10/2020 2013   PROTEINUR 30 (A) 08/10/2020 2013   UROBILINOGEN  negative (A) 01/17/2020 1422   UROBILINOGEN 0.2 01/30/2012 0120   NITRITE NEGATIVE 08/10/2020 2013   LEUKOCYTESUR NEGATIVE 08/10/2020 2013   Sepsis Labs Invalid input(s): PROCALCITONIN,  WBC,  LACTICIDVEN Microbiology Recent Results (from the past 240 hour(s))  MRSA Next Gen by PCR, Nasal     Status: None   Collection Time: 08/17/21  1:39 AM   Specimen: Nasal Mucosa; Nasal Swab  Result Value Ref Range Status   MRSA by PCR Next Gen NOT DETECTED NOT DETECTED Final    Comment: (NOTE) The GeneXpert MRSA Assay (FDA approved for NASAL specimens only), is one component of a comprehensive MRSA colonization surveillance program. It is not intended to diagnose MRSA infection nor to guide or monitor treatment for MRSA infections. Test performance is not FDA approved in patients less than 49 years old. Performed at [facility], [address], [place], [place] [address]      Time coordinating discharge: 35 minutes  SIGNED:   Rodena Goldmann, DO [facility] 08/18/2021, 9:26 AM  If 7PM-7AM, please contact night-coverage [email or web address]

## 2021-08-18 NOTE — Discharge Instructions (Addendum)
Information on my medicine - ELIQUIS (apixaban)  This medication education was reviewed with me or my healthcare representative as part of my discharge preparation.  The pharmacist that spoke with me during my hospital stay was:  Ramond Craver, [facility]  Why was Eliquis prescribed for you? Eliquis was prescribed to treat blood clots that may have been found in the veins of your legs (deep vein thrombosis) or in your lungs (pulmonary embolism) and to reduce the risk of them occurring again.  What do You need to know about Eliquis ? The starting dose is 10 mg (two 5 mg tablets) taken TWICE daily for the FIRST SEVEN (7) DAYS, then on 08/25/21    the dose is reduced to ONE 5 mg tablet taken TWICE daily.  Eliquis may be taken with or without food.   Try to take the dose about the same time in the morning and in the evening. If you have difficulty swallowing the tablet whole please discuss with your pharmacist how to take the medication safely.  Take Eliquis exactly as prescribed and DO NOT stop taking Eliquis without talking to the doctor who prescribed the medication.  Stopping may increase your risk of developing a new blood clot.  Refill your prescription before you run out.  After discharge, you should have regular check-up appointments with your healthcare provider that is prescribing your Eliquis.    What do you do if you miss a dose? If a dose of ELIQUIS is not taken at the scheduled time, take it as soon as possible on the same day and twice-daily administration should be resumed. The dose should not be doubled to make up for a missed dose.  Important Safety Information A possible side effect of Eliquis is bleeding. You should call your healthcare provider right away if you experience any of the following: Bleeding from an injury or your nose that does not stop. Unusual colored urine (red or dark brown) or unusual colored stools (red or black). Unusual bruising for unknown reasons. A  serious fall or if you hit your head (even if there is no bleeding).  Some medicines may interact with Eliquis and might increase your risk of bleeding or clotting while on Eliquis. To help avoid this, consult your healthcare provider or pharmacist prior to using any new prescription or non-prescription medications, including herbals, vitamins, non-steroidal anti-inflammatory drugs (NSAIDs) and supplements.  This website has more information on Eliquis (apixaban): http://www.eliquis.com/eliquis/home

## 2021-09-04 NOTE — Progress Notes (Signed)
[place], [place] [address]   CLINIC:  Medical Oncology/Hematology  PCP:  [name], MD [place] [address] [phone]   REASON FOR VISIT:  Follow-up for recurrent pulmonary embolism  PRIOR THERAPY: Xarelto (10/02/2020 through 02/01/2021)  CURRENT THERAPY: Eliquis  INTERVAL HISTORY:  Mr. Milanes 66 y.o. male returns to clinic to reestablish care.  He was previously evaluated by hematology clinic for recurrent DVTs.  Hematology work-up showed factor V Leiden heterozygosity, but since both of his prior DVTs were provoked, no lifelong anticoagulation was recommended.  Patient was tentatively discharged from the clinic, but told to return if he had any future venous thromboembolism.  Unfortunately, patient was recently diagnosed with PE, and returns today to reestablish care.  Patient presented to hospital on 08/16/2021 with complaints of progressive dyspnea x1 month, was found to have acute hypoxemic respiratory failure in the setting of a bilateral PE, but with no signs of right ventricular strain on CT scan.  Ultrasound of lower extremities showed bilateral DVTs.  He was started on IV heparin drip while hospitalized, and transition to Eliquis at discharge.  At the time of discharge, his symptoms had improved and he no longer required off was on supplementation.  At today's visit, he reports feeling fair.  He continues to have some residual shortness of breath and dyspnea on exertion.  He admits to dry cough but denies any hemoptysis.  No palpitations.  He denies what he would call "chest pain," but reports that he has cramping aches all over his body that will sometimes occur in the middle of his chest; he is being followed by cardiology.  He has some residual bilateral leg swelling, left slightly greater than right.  He is tolerating Eliquis well, and denies any bleeding events such as epistaxis, hematemesis, hematochezia, or  melena.  He does report some easy bruising.  Patient complains of feeling nauseous in the mornings and some days, and wonders if this may be due to to his multiple new medications which include Eliquis, blood pressure medications, and cholesterol medications.  He has 70% energy and 100% appetite. He endorses that he is maintaining a stable weight.   REVIEW OF SYSTEMS:  Review of Systems  Constitutional:  Positive for fatigue (Energy 70%). Negative for appetite change, chills, diaphoresis, fever and unexpected weight change.  HENT:   Negative for lump/mass and nosebleeds.   Eyes:  Negative for eye problems.  Respiratory:  Positive for cough and shortness of breath. Negative for hemoptysis.   Cardiovascular:  Positive for chest pain (None today) and leg swelling. Negative for palpitations.  Gastrointestinal:  Positive for nausea. Negative for abdominal pain, blood in stool, constipation, diarrhea and vomiting.  Genitourinary:  Negative for hematuria.   Skin: Negative.   Neurological:  Negative for dizziness, headaches and light-headedness.  Hematological:  Does not bruise/bleed easily.     PAST MEDICAL/SURGICAL HISTORY:  Past Medical History:  Diagnosis Date   Bilateral shoulder bursitis    Chronic back pain    COPD (chronic obstructive pulmonary disease) with emphysema ([name])    DDD (degenerative disc disease), lumbar    hx epideral injection  L5--S1    Diverticulitis    DVT (deep venous thrombosis) ([place]) 11/19/2018   Dysphasia    intermittant w/ food    Dyspnea    Dyspnea on exertion    GERD (gastroesophageal reflux disease)    Gout    L great toe  Heterozygous factor V Leiden mutation ([place])    Hiatal hernia    Hip problem 2020   left, seeing orthopedics   History of acute pancreatitis    alcoholic pancreatitis [id number] and 01-30-2012   History of colon polyps    10-17-2005  hyperplastic polyp's   History of esophageal stricture    s/p  dilation 02-02-2017   History  of peptic ulcer 1990s   History of pneumococcal pneumonia 2006   bilateral pneumonia w/ left lower lobe abscess treated w/ chest tube with suction for drainage   History of pneumothorax    1984--  left spontaneous pneumothorax ,  treated w/ chest tube   Malignant neoplasm of prostate [facility]) urologist-  dr dahlstedt/  oncologist -- dr Tammi Klippel   dx 12-02-2016-- Stage T2b, Gleason 3+4,  PSA 5.99,  vol 25cc   Numbness of left foot    outer left ankle numb-- per pt has appt. w/ neurologist   OSA (obstructive sleep apnea)    per pt has not used cpap over year ago from 04-16-2017--- per study 01-17-2010  severe osa   Solid nodule of lung greater than 8 mm in diameter 05/11/2018   03/2018: RUL Nodule, 65mm   Wears glasses    Past Surgical History:  Procedure Laterality Date   BIOPSY  02/02/2017   Procedure: BIOPSY;  Surgeon: Daneil Dolin, MD;  [facility];  Service: Endoscopy;;  duodenal bx's, esophgeal bx's   BIOPSY  07/30/2017   Procedure: BIOPSY;  Surgeon: Daneil Dolin, MD;  [facility];  Service: Endoscopy;;  esophagus   BIOPSY  02/15/2021   Procedure: BIOPSY;  Surgeon: Daneil Dolin, MD;  [facility];  Service: Endoscopy;;   CARDIOVASCULAR STRESS TEST  09/12/2009   normal nuclear perfusion study w/ no ischemia /  normal LV function and wall motion , ef 57%   COLONOSCOPY  2007   Dr. Gala Romney: hyperplastic polyps, internal hemorrhoids    COLONOSCOPY WITH PROPOFOL N/A 05/03/2018   Dr. Gala Romney: diverticulosis in entire colon, multiple polyps in sigmoid, at splenic flexure, and ascending colon, not all polyps removed. Tubular adenomas and inflammatory polyps. Needs 3 year surveilance   COLONOSCOPY WITH PROPOFOL N/A 02/15/2021   Procedure: COLONOSCOPY WITH PROPOFOL;  Surgeon: Daneil Dolin, MD;  [facility];  Service: Endoscopy;  Laterality: N/A;  2:30pm   EPIDURAL BLOCK INJECTION  2020   ESOPHAGOGASTRODUODENOSCOPY (EGD) WITH PROPOFOL N/A 02/02/2017    Dr. Gala Romney: esophageal stenosis s/p dilation and biopsy, medium sized hiatal hernia, normal duodenum   ESOPHAGOGASTRODUODENOSCOPY (EGD) WITH PROPOFOL N/A 07/30/2017   Dr. Gala Romney: esophageal stenosis s/p dilation and biopsy, medium-sized hiatal hernia, normal duodenum   HEMORRHOID SURGERY  01-21-2008    [facility]   INTRAVASCULAR PRESSURE WIRE/FFR STUDY N/A 08/01/2021   Procedure: INTRAVASCULAR PRESSURE WIRE/FFR STUDY;  Surgeon: Burnell Blanks, MD;  Location: [place] [facility];  Service: Cardiovascular;  Laterality: N/A;   IR RADIOLOGIST EVAL & MGMT  08/29/2020   LEFT HEART CATH AND CORONARY ANGIOGRAPHY N/A 08/01/2021   Procedure: LEFT HEART CATH AND CORONARY ANGIOGRAPHY;  Surgeon: Burnell Blanks, MD;  Location: [place] [facility];  Service: Cardiovascular;  Laterality: N/A;   MALONEY DILATION N/A 02/02/2017   Procedure: Venia Minks DILATION;  Surgeon: Daneil Dolin, MD;  [facility];  Service: Endoscopy;  Laterality: N/A;   MALONEY DILATION N/A 07/30/2017   Procedure: Venia Minks DILATION;  Surgeon: Daneil Dolin, MD;  [facility];  Service: Endoscopy;  Laterality: N/A;   POLYPECTOMY  05/03/2018   Procedure: POLYPECTOMY;  Surgeon: Daneil Dolin, MD;  [facility];  Service: Endoscopy;;  ascending colon polyp, sigmoid colon polyps (multiple)   POLYPECTOMY  02/15/2021   Procedure: POLYPECTOMY;  Surgeon: Daneil Dolin, MD;  [facility];  Service: Endoscopy;;   RADIOACTIVE SEED IMPLANT N/A 04/23/2017   Procedure: RADIOACTIVE SEED IMPLANT/BRACHYTHERAPY IMPLANT;  Surgeon: Franchot Gallo, MD;  Location: [facility];  Service: Urology;  Laterality: N/A;   SHOULDER ARTHROSCOPY  08/05/2003   SPACE OAR INSTILLATION N/A 04/23/2017   Procedure: SPACE OAR INSTILLATION;  Surgeon: Franchot Gallo, MD;  Location: [facility];  Service: Urology;  Laterality: N/A;   TRANSTHORACIC ECHOCARDIOGRAM  03/28/2009   mild  LVH, ef 55-60%/ mild aorta calcification   VASECTOMY  08/05/1983   VIDEO ASSISTED THORACOSCOPY (VATS)/DECORTICATION Left      SOCIAL HISTORY:  Social History   Socioeconomic History   Marital status: Married    Spouse name: Not on file   Number of children: 2   Years of education: Not on file   Highest education level: Bachelor's degree (e.g., BA, AB, BS)  Occupational History   Not on file  Tobacco Use   Smoking status: Former    Packs/day: 1.00    Years: 30.00    Pack years: 30.00    Types: Cigarettes, E-cigarettes    Quit date: 10/03/2015    Years since quitting: 5.9   Smokeless tobacco: Never   Tobacco comments:    stoped vaping 2018  Vaping Use   Vaping Use: Former  Substance and Sexual Activity   Alcohol use: Yes    Alcohol/week: 5.0 standard drinks    Types: 5 Glasses of wine per week   Drug use: No   Sexual activity: Yes    Birth control/protection: None  Other Topics Concern   Not on file  Social History Narrative   Lives at home with his wife   Right handed   Social Determinants of Health   Financial Resource Strain: Not on file  Food Insecurity: Not on file  Transportation Needs: Not on file  Physical Activity: Not on file  Stress: Not on file  Social Connections: Not on file  Intimate Partner Violence: Not on file    FAMILY HISTORY:  Family History  Problem Relation Age of Onset   Diabetes Mother    Heart disease Father    Heart disease Brother    Cancer Neg Hx    Colon cancer Neg Hx    Colon polyps Neg Hx    Neuropathy Neg Hx     CURRENT MEDICATIONS:  Outpatient Encounter Medications as of 09/05/2021  Medication Sig   acetaminophen (TYLENOL) 500 MG tablet Take 2 tablets (1,000 mg total) by mouth every 6 (six) hours as needed for mild pain.   albuterol (PROVENTIL) (2.5 MG/3ML) 0.083% nebulizer solution Take 2.5 mg by nebulization every 6 (six) hours as needed for wheezing or shortness of breath.   albuterol (VENTOLIN HFA) 108 (90 Base)  MCG/ACT inhaler Inhale 2 puffs into the lungs every 6 (six) hours as needed for wheezing or shortness of breath.   allopurinol (ZYLOPRIM) 300 MG tablet Take 300 mg by mouth daily.   apixaban (ELIQUIS) 5 MG TABS tablet Take 2 tablets (10 mg total) by mouth 2 (two) times daily for 7 days.   apixaban (ELIQUIS) 5 MG TABS tablet Take 1 tablet (  5 mg total) by mouth 2 (two) times daily.   Artificial Tear Solution (SOOTHE XP OP) Place 1 drop into both eyes daily as needed (Dry eyes).   Fluticasone-Umeclidin-Vilant (TRELEGY ELLIPTA) 100-62.5-25 MCG/ACT AEPB Inhale 1 puff into the lungs daily.   ibuprofen (ADVIL) 600 MG tablet Take 600 mg by mouth 2 (two) times daily as needed for pain.   metoprolol succinate (TOPROL XL) 25 MG 24 hr tablet Take 1 tablet (25 mg total) by mouth daily.   psyllium (METAMUCIL SMOOTH TEXTURE) 28 % packet Take 1 packet by mouth daily.   rosuvastatin (CRESTOR) 20 MG tablet Take 1 tablet (20 mg total) by mouth daily.   No facility-administered encounter medications on file as of 09/05/2021.    ALLERGIES:  No Known Allergies   PHYSICAL EXAM:  ECOG PERFORMANCE STATUS: 1 - Symptomatic but completely ambulatory  There were no vitals filed for this visit. There were no vitals filed for this visit. Physical Exam Constitutional:      Appearance: Normal appearance. He is obese.  HENT:     Head: Normocephalic and atraumatic.     Mouth/Throat:     Mouth: Mucous membranes are moist.  Eyes:     Extraocular Movements: Extraocular movements intact.     Pupils: Pupils are equal, round, and reactive to light.  Cardiovascular:     Rate and Rhythm: Normal rate and regular rhythm.     Pulses: Normal pulses.     Heart sounds: Normal heart sounds.  Pulmonary:     Effort: Pulmonary effort is normal.     Breath sounds: Normal breath sounds.  Abdominal:     General: Bowel sounds are normal.     Palpations: Abdomen is soft.     Tenderness: There is no abdominal tenderness.   Musculoskeletal:        General: No swelling.     Right lower leg: Edema (trace) present.     Left lower leg: Edema (1+ pretibial pitting edema) present.  Lymphadenopathy:     Cervical: No cervical adenopathy.  Skin:    General: Skin is warm and dry.     Comments: Dusky red hyperpigmentation of distal bilateral lower extremities  Neurological:     General: No focal deficit present.     Mental Status: He is alert and oriented to person, place, and time.  Psychiatric:        Mood and Affect: Mood normal.        Behavior: Behavior normal.     LABORATORY DATA:  I have reviewed the labs as listed.  CBC    Component Value Date/Time   WBC 8.1 08/18/2021 0432   RBC 3.93 (L) 08/18/2021 0432   HGB 14.0 08/18/2021 0432   HGB 15.6 07/30/2021 1342   HCT 40.8 08/18/2021 0432   HCT 44.8 07/30/2021 1342   PLT 127 (L) 08/18/2021 0432   PLT 162 07/30/2021 1342   MCV 103.8 (H) 08/18/2021 0432   MCV 100 (H) 07/30/2021 1342   MCH 35.6 (H) 08/18/2021 0432   MCHC 34.3 08/18/2021 0432   RDW 12.9 08/18/2021 0432   RDW 12.7 07/30/2021 1342   LYMPHSABS 2.7 08/27/2020 0920   MONOABS 0.7 08/27/2020 0920   EOSABS 0.4 08/27/2020 0920   BASOSABS 0.1 08/27/2020 0920   CMP Latest Ref Rng & Units 08/18/2021 08/17/2021 08/16/2021  Glucose 70 - 99 mg/dL 97 99 132(H)  BUN 8 - 23 mg/dL 9 10 9   Creatinine 0.61 - 1.24 mg/dL 0.78 0.88 0.97  Sodium 135 - 145 mmol/L 137 139 135  Potassium 3.5 - 5.1 mmol/L 3.9 4.0 3.4(L)  Chloride 98 - 111 mmol/L 106 105 102  CO2 22 - 32 mmol/L 25 26 24   Calcium 8.9 - 10.3 mg/dL 8.8(L) 9.1 9.0  Total Protein 6.5 - 8.1 g/dL 6.0(L) 6.2(L) -  Total Bilirubin 0.3 - 1.2 mg/dL 0.4 0.7 -  Alkaline Phos 38 - 126 U/L 68 68 -  AST 15 - 41 U/L 51(H) 55(H) -  ALT 0 - 44 U/L 43 46(H) -    DIAGNOSTIC IMAGING:  I have independently reviewed the relevant imaging and discussed with the patient.  ASSESSMENT & PLAN: 1.  Recurrent PE and DVT, factor V Leiden heterozygosity - Previously  evaluated by hematology clinic for recurrent DVTs (October 2019 and January 2022), which were both felt to be a provoked - Completed Xarelto x4 months (10/02/2020 through 02/01/2021) - Venous duplex ultrasound (01/23/2021) showed resolution of right lower extremity DVT without any residual thrombus - Hematology work-up showed factor V Leiden heterozygosity, but since both of his prior DVTs were provoked, no lifelong anticoagulation was recommended. - Patient presented to hospital on 08/16/2021 with complaints of progressive dyspnea x1 month, was found to have acute hypoxemic respiratory failure in the setting of a bilateral PE, but with no signs of right ventricular strain on CT scan.  Ultrasound of lower extremities showed bilateral DVTs.  He was started on IV heparin drip while hospitalized, and transition to Eliquis at discharge.  At the time of discharge, his symptoms had improved and he no longer required off was on supplementation. - He is tolerating Eliquis well without any major bleeding events. - He continues to have some lower extremity edema, left greater than right; admits to residual shortness of breath, dyspnea on exertion, and cough. - PLAN: Given his recurrent bilateral PE and bilateral DVTs in January 2023, recommend indefinite anticoagulation. - We have discussed risks and benefits of anticoagulation in depth, and patient is agreeable to proceeding. - RTC in 4 months for reassessment and ongoing evaluation of risks and benefits of chronic anticoagulation. - If he has persistent or worsening symptoms at follow-up visit, would consider repeat imaging  2.  Prostate cancer: -TRUS/biopsy on 12/02/2016.  4 mm left apical nodule, PSA 5.99.  6/12 cores positive for adenocarcinoma, all on the left side of the prostate.  3 cores revealed GS 3+3, 3 cores with GS 3+4.  He underwent I-125 brachytherapy on 04/23/2017. - Latest PSA on 01/24/2021 was < 0.1 - PLAN: Continue follow-up with Dr.  Diona Fanti   PLAN SUMMARY & DISPOSITION: Labs and RTC in 4 months  All questions were answered. The patient knows to call the clinic with any problems, questions or concerns.  Medical decision making: Moderate  Time spent on visit: I spent 20 minutes counseling the patient face to face. The total time spent in the appointment was 30 minutes and more than 50% was on counseling.   Harriett Rush, PA-C  09/05/2021 9:46 AM

## 2021-09-05 ENCOUNTER — Other Ambulatory Visit: Payer: Self-pay

## 2021-09-05 ENCOUNTER — Inpatient Hospital Stay (HOSPITAL_COMMUNITY): Payer: BC Managed Care – PPO | Attending: Physician Assistant | Admitting: Physician Assistant

## 2021-09-05 VITALS — BP 114/77 | HR 72 | Temp 98.1°F | Resp 18 | Ht 73.0 in | Wt 235.4 lb

## 2021-09-05 DIAGNOSIS — I2694 Multiple subsegmental pulmonary emboli without acute cor pulmonale: Secondary | ICD-10-CM | POA: Diagnosis not present

## 2021-09-05 DIAGNOSIS — Z7901 Long term (current) use of anticoagulants: Secondary | ICD-10-CM | POA: Diagnosis not present

## 2021-09-05 DIAGNOSIS — I2699 Other pulmonary embolism without acute cor pulmonale: Secondary | ICD-10-CM | POA: Diagnosis present

## 2021-09-05 DIAGNOSIS — Z87891 Personal history of nicotine dependence: Secondary | ICD-10-CM | POA: Diagnosis not present

## 2021-09-05 DIAGNOSIS — Z79899 Other long term (current) drug therapy: Secondary | ICD-10-CM | POA: Insufficient documentation

## 2021-09-05 DIAGNOSIS — D6851 Activated protein C resistance: Secondary | ICD-10-CM | POA: Insufficient documentation

## 2021-09-05 DIAGNOSIS — R110 Nausea: Secondary | ICD-10-CM | POA: Diagnosis not present

## 2021-09-05 DIAGNOSIS — Z86718 Personal history of other venous thrombosis and embolism: Secondary | ICD-10-CM | POA: Diagnosis not present

## 2021-09-05 DIAGNOSIS — I82431 Acute embolism and thrombosis of right popliteal vein: Secondary | ICD-10-CM

## 2021-09-05 NOTE — Patient Instructions (Signed)
[place] at [facility] Discharge Instructions  You were seen today by Tarri Abernethy PA-C for your blood clots.  You will need to stay on lifelong blood thinners for the foreseeable future.  We will see you again 4 months to see how you are doing.  If you have any symptoms of a blood clot coming back - or any major bleeding episodes - seek IMMEDIATE medical attention!  LABS: Return in 4 months for repeat labs   MEDICATIONS: Eliquis 5 mg BID  FOLLOW-UP APPOINTMENT: Office visit in 4 months   Thank you for choosing [place] at [facility] to provide your oncology and hematology care.  To afford each patient quality time with our provider, please arrive at least 15 minutes before your scheduled appointment time.   If you have a lab appointment with the [facility] please come in thru the Main Entrance and check in at the main information desk.  You need to re-schedule your appointment should you arrive 10 or more minutes late.  We strive to give you quality time with our providers, and arriving late affects you and other patients whose appointments are after yours.  Also, if you no show three or more times for appointments you may be dismissed from the clinic at the providers discretion.     Again, thank you for choosing [facility].  Our hope is that these requests will decrease the amount of time that you wait before being seen by our physicians.       _____________________________________________________________  Should you have questions after your visit to [facility], please contact our office at [phone] and follow the prompts.  Our office hours are 8:00 a.m. and 4:30 p.m. Monday - Friday.  Please note that voicemails left after 4:00 p.m. may not be returned until the following business day.  We are closed weekends and major holidays.  You do have access to a nurse 24-7, just call the main number to the  clinic [phone] and do not press any options, hold on the line and a nurse will answer the phone.    For prescription refill requests, have your pharmacy contact our office and allow 72 hours.    Due to Covid, you will need to wear a mask upon entering the hospital. If you do not have a mask, a mask will be given to you at the Main Entrance upon arrival. For doctor visits, patients may have 1 support person age 78 or older with them. For treatment visits, patients can not have anyone with them due to social distancing guidelines and our immunocompromised population.

## 2021-09-17 ENCOUNTER — Ambulatory Visit: Payer: 59 | Admitting: Cardiology

## 2021-09-23 ENCOUNTER — Inpatient Hospital Stay (HOSPITAL_COMMUNITY)
Admission: EM | Admit: 2021-09-23 | Discharge: 2021-09-26 | DRG: 392 | Disposition: A | Discharge status: Home / Self Care | Payer: BC Managed Care – PPO | Attending: Internal Medicine | Admitting: Internal Medicine

## 2021-09-23 ENCOUNTER — Emergency Department (HOSPITAL_COMMUNITY): Payer: BC Managed Care – PPO

## 2021-09-23 ENCOUNTER — Encounter (HOSPITAL_COMMUNITY): Payer: Self-pay | Admitting: Emergency Medicine

## 2021-09-23 DIAGNOSIS — K7010 Alcoholic hepatitis without ascites: Secondary | ICD-10-CM | POA: Diagnosis present

## 2021-09-23 DIAGNOSIS — M109 Gout, unspecified: Secondary | ICD-10-CM | POA: Diagnosis present

## 2021-09-23 DIAGNOSIS — Z86010 Personal history of colonic polyps: Secondary | ICD-10-CM | POA: Diagnosis not present

## 2021-09-23 DIAGNOSIS — Z20822 Contact with and (suspected) exposure to covid-19: Secondary | ICD-10-CM | POA: Diagnosis present

## 2021-09-23 DIAGNOSIS — E782 Mixed hyperlipidemia: Secondary | ICD-10-CM | POA: Diagnosis present

## 2021-09-23 DIAGNOSIS — E876 Hypokalemia: Secondary | ICD-10-CM | POA: Diagnosis present

## 2021-09-23 DIAGNOSIS — Z8711 Personal history of peptic ulcer disease: Secondary | ICD-10-CM

## 2021-09-23 DIAGNOSIS — Z7951 Long term (current) use of inhaled steroids: Secondary | ICD-10-CM

## 2021-09-23 DIAGNOSIS — Z87891 Personal history of nicotine dependence: Secondary | ICD-10-CM

## 2021-09-23 DIAGNOSIS — K219 Gastro-esophageal reflux disease without esophagitis: Secondary | ICD-10-CM | POA: Diagnosis present

## 2021-09-23 DIAGNOSIS — J439 Emphysema, unspecified: Secondary | ICD-10-CM | POA: Diagnosis present

## 2021-09-23 DIAGNOSIS — K5792 Diverticulitis of intestine, part unspecified, without perforation or abscess without bleeding: Secondary | ICD-10-CM | POA: Diagnosis present

## 2021-09-23 DIAGNOSIS — M5136 Other intervertebral disc degeneration, lumbar region: Secondary | ICD-10-CM | POA: Diagnosis present

## 2021-09-23 DIAGNOSIS — Z8249 Family history of ischemic heart disease and other diseases of the circulatory system: Secondary | ICD-10-CM

## 2021-09-23 DIAGNOSIS — K828 Other specified diseases of gallbladder: Secondary | ICD-10-CM | POA: Diagnosis present

## 2021-09-23 DIAGNOSIS — K760 Fatty (change of) liver, not elsewhere classified: Secondary | ICD-10-CM

## 2021-09-23 DIAGNOSIS — Z86711 Personal history of pulmonary embolism: Secondary | ICD-10-CM | POA: Diagnosis not present

## 2021-09-23 DIAGNOSIS — Z8546 Personal history of malignant neoplasm of prostate: Secondary | ICD-10-CM

## 2021-09-23 DIAGNOSIS — Z79899 Other long term (current) drug therapy: Secondary | ICD-10-CM

## 2021-09-23 DIAGNOSIS — D6851 Activated protein C resistance: Secondary | ICD-10-CM | POA: Diagnosis present

## 2021-09-23 DIAGNOSIS — K5732 Diverticulitis of large intestine without perforation or abscess without bleeding: Secondary | ICD-10-CM | POA: Diagnosis not present

## 2021-09-23 DIAGNOSIS — Z7901 Long term (current) use of anticoagulants: Secondary | ICD-10-CM

## 2021-09-23 DIAGNOSIS — Z9989 Dependence on other enabling machines and devices: Secondary | ICD-10-CM | POA: Diagnosis not present

## 2021-09-23 DIAGNOSIS — G4733 Obstructive sleep apnea (adult) (pediatric): Secondary | ICD-10-CM | POA: Diagnosis present

## 2021-09-23 DIAGNOSIS — Z86718 Personal history of other venous thrombosis and embolism: Secondary | ICD-10-CM | POA: Diagnosis not present

## 2021-09-23 DIAGNOSIS — G8929 Other chronic pain: Secondary | ICD-10-CM | POA: Diagnosis present

## 2021-09-23 DIAGNOSIS — Z8701 Personal history of pneumonia (recurrent): Secondary | ICD-10-CM

## 2021-09-23 DIAGNOSIS — Z833 Family history of diabetes mellitus: Secondary | ICD-10-CM

## 2021-09-23 DIAGNOSIS — J449 Chronic obstructive pulmonary disease, unspecified: Secondary | ICD-10-CM | POA: Diagnosis not present

## 2021-09-23 DIAGNOSIS — K5900 Constipation, unspecified: Secondary | ICD-10-CM | POA: Diagnosis present

## 2021-09-23 LAB — COMPREHENSIVE METABOLIC PANEL
ALT: 56 U/L — ABNORMAL HIGH (ref 0–44)
AST: 71 U/L — ABNORMAL HIGH (ref 15–41)
Albumin: 3.8 g/dL (ref 3.5–5.0)
Alkaline Phosphatase: 62 U/L (ref 38–126)
Anion gap: 9 (ref 5–15)
BUN: 12 mg/dL (ref 8–23)
CO2: 21 mmol/L — ABNORMAL LOW (ref 22–32)
Calcium: 9.2 mg/dL (ref 8.9–10.3)
Chloride: 106 mmol/L (ref 98–111)
Creatinine, Ser: 0.87 mg/dL (ref 0.61–1.24)
GFR, Estimated: >60 mL/min (ref >60)
Glucose, Bld: 165 mg/dL — ABNORMAL HIGH (ref 70–99)
Potassium: 3.4 mmol/L — ABNORMAL LOW (ref 3.5–5.1)
Sodium: 136 mmol/L (ref 135–145)
Total Bilirubin: 0.6 mg/dL (ref 0.3–1.2)
Total Protein: 7.1 g/dL (ref 6.5–8.1)

## 2021-09-23 LAB — URINALYSIS, ROUTINE W REFLEX MICROSCOPIC
Bilirubin Urine: NEGATIVE
Glucose, UA: NEGATIVE mg/dL
Hgb urine dipstick: NEGATIVE
Ketones, ur: NEGATIVE mg/dL
Leukocytes,Ua: NEGATIVE
Nitrite: NEGATIVE
Protein, ur: NEGATIVE mg/dL
Specific Gravity, Urine: 1.003 — ABNORMAL LOW (ref 1.005–1.030)
pH: 6 (ref 5.0–8.0)

## 2021-09-23 LAB — CBC
HCT: 43.4 % (ref 39.0–52.0)
Hemoglobin: 15 g/dL (ref 13.0–17.0)
MCH: 35.8 pg — ABNORMAL HIGH (ref 26.0–34.0)
MCHC: 34.6 g/dL (ref 30.0–36.0)
MCV: 103.6 fL — ABNORMAL HIGH (ref 80.0–100.0)
Platelets: 171 10*3/uL (ref 150–400)
RBC: 4.19 MIL/uL — ABNORMAL LOW (ref 4.22–5.81)
RDW: 12.9 % (ref 11.5–15.5)
WBC: 8 10*3/uL (ref 4.0–10.5)
nRBC: 0 % (ref 0.0–0.2)

## 2021-09-23 LAB — LIPASE, BLOOD: Lipase: 36 U/L (ref 11–51)

## 2021-09-23 MED ORDER — HYDROMORPHONE HCL 1 MG/ML IJ SOLN
0.5000 mg | Freq: Once | INTRAMUSCULAR | Status: AC
  Administered 2021-09-23: 0.5 mg via INTRAVENOUS
  Filled 2021-09-23: qty 1

## 2021-09-23 MED ORDER — IOHEXOL 300 MG/ML  SOLN
100.0000 mL | Freq: Once | INTRAMUSCULAR | Status: AC | PRN
  Administered 2021-09-23: 100 mL via INTRAVENOUS

## 2021-09-23 MED ORDER — PIPERACILLIN-TAZOBACTAM 3.375 G IVPB
3.3750 g | Freq: Three times a day (TID) | INTRAVENOUS | Status: DC
  Administered 2021-09-24 – 2021-09-26 (×8): 3.375 g via INTRAVENOUS
  Filled 2021-09-23 (×8): qty 50

## 2021-09-23 MED ORDER — SODIUM CHLORIDE 0.9 % IV BOLUS
500.0000 mL | Freq: Once | INTRAVENOUS | Status: AC
  Administered 2021-09-23: 500 mL via INTRAVENOUS

## 2021-09-23 MED ORDER — ONDANSETRON HCL 4 MG/2ML IJ SOLN
4.0000 mg | Freq: Once | INTRAMUSCULAR | Status: AC
Start: 2021-09-23 — End: 2021-09-23
  Administered 2021-09-23: 4 mg via INTRAVENOUS
  Filled 2021-09-23: qty 2

## 2021-09-23 NOTE — ED Provider Notes (Signed)
[facility] EMERGENCY DEPARTMENT Provider Note   CSN: [id number] Arrival date & time: 09/23/21  1805     History  Chief Complaint  Patient presents with   Abdominal Pain    [name] is a 66 y.o. male.  Patient complains of left lower quadrant abdominal pain.  Patient has a history of diverticulitis.  He saw his doctor 10 days ago and he was started on Cipro and Flagyl for diverticulitis.  Patient states that symptoms have not improved.  He does have a history of diverticulitis last year with a perforation and he was discharged home on Invanz to get IV at home  The history is provided by the patient and medical records. No language interpreter was used.  Abdominal Pain Pain location:  Generalized Pain quality: aching   Pain radiates to:  Does not radiate Pain severity:  Moderate Onset quality:  Sudden Timing:  Constant Progression:  Worsening Chronicity:  New Context: not alcohol use   Relieved by:  Nothing Worsened by:  Nothing Associated symptoms: no chest pain, no cough, no diarrhea, no fatigue and no hematuria       Home Medications Prior to Admission medications   Medication Sig Start Date End Date Taking? Authorizing Provider  acetaminophen (TYLENOL) 500 MG tablet Take 2 tablets (1,000 mg total) by mouth every 6 (six) hours as needed for mild pain. 08/21/20  Yes Meuth, Brooke A, PA-C  albuterol (PROVENTIL) (2.5 MG/3ML) 0.083% nebulizer solution Take 2.5 mg by nebulization every 6 (six) hours as needed for wheezing or shortness of breath.   Yes [provider]  albuterol (VENTOLIN HFA) 108 (90 Base) MCG/ACT inhaler Inhale 2 puffs into the lungs every 6 (six) hours as needed for wheezing or shortness of breath.   Yes [provider]  allopurinol (ZYLOPRIM) 300 MG tablet Take 300 mg by mouth daily. 09/17/20  Yes [provider]  apixaban (ELIQUIS) 5 MG TABS tablet Take 1 tablet (5 mg total) by mouth 2 (two) times daily. 08/25/21 09/24/21 Yes  Shah, Pratik D, DO  Artificial Tear Solution (SOOTHE XP OP) Place 1 drop into both eyes daily as needed (Dry eyes).   Yes [provider]  ciprofloxacin (CIPRO) 500 MG tablet Take 500 mg by mouth 2 (two) times daily. 09/13/21  Yes [provider]  doxycycline (VIBRAMYCIN) 50 MG capsule Take 50 mg by mouth daily. 08/21/21  Yes [provider]  Fluticasone-Umeclidin-Vilant (TRELEGY ELLIPTA) 100-62.5-25 MCG/ACT AEPB Inhale 1 puff into the lungs daily. 07/11/21  Yes Icard, Bradley L, DO  gabapentin (NEURONTIN) 300 MG capsule Take 300 mg by mouth daily. 09/10/21  Yes [provider]  metoprolol succinate (TOPROL XL) 25 MG 24 hr tablet Take 1 tablet (25 mg total) by mouth daily. 07/26/21  Yes Richardo Priest, MD  psyllium (METAMUCIL SMOOTH TEXTURE) 28 % packet Take 1 packet by mouth daily.   Yes [provider]  rosuvastatin (CRESTOR) 20 MG tablet Take 1 tablet (20 mg total) by mouth daily. 07/26/21 10/24/21 Yes Richardo Priest, MD  apixaban (ELIQUIS) 5 MG TABS tablet Take 2 tablets (10 mg total) by mouth 2 (two) times daily for 7 days. Patient not taking: Reported on 09/23/2021 08/18/21 09/23/21  Heath Lark D, DO      Allergies    Patient has no known allergies.    Review of Systems   Review of Systems  Constitutional:  Negative for appetite change and fatigue.  HENT:  Negative for congestion, ear discharge  and sinus pressure.   Eyes:  Negative for discharge.  Respiratory:  Negative for cough.   Cardiovascular:  Negative for chest pain.  Gastrointestinal:  Positive for abdominal pain. Negative for diarrhea.  Genitourinary:  Negative for frequency and hematuria.  Musculoskeletal:  Negative for back pain.  Skin:  Negative for rash.  Neurological:  Negative for seizures and headaches.  Psychiatric/Behavioral:  Negative for hallucinations.    Physical Exam Updated Vital Signs BP 118/72    Pulse 65    Temp 97.8 F (36.6 C) (Oral)    Resp 12    Ht 6\' 1"   (1.854 m)    Wt 106.8 kg    SpO2 100%    BMI 31.06 kg/m  Physical Exam Vitals and nursing note reviewed.  Constitutional:      Appearance: He is well-developed.  HENT:     Head: Normocephalic.     Nose: Nose normal.  Eyes:     General: No scleral icterus.    Conjunctiva/sclera: Conjunctivae normal.  Neck:     Thyroid: No thyromegaly.  Cardiovascular:     Rate and Rhythm: Normal rate and regular rhythm.     Heart sounds: No murmur heard.   No friction rub. No gallop.  Pulmonary:     Breath sounds: No stridor. No wheezing or rales.  Chest:     Chest wall: No tenderness.  Abdominal:     General: There is no distension.     Tenderness: There is abdominal tenderness. There is no rebound.  Musculoskeletal:        General: Normal range of motion.     Cervical back: Neck supple.  Lymphadenopathy:     Cervical: No cervical adenopathy.  Skin:    Findings: No erythema or rash.  Neurological:     Mental Status: He is alert and oriented to person, place, and time.     Motor: No abnormal muscle tone.     Coordination: Coordination normal.  Psychiatric:        Behavior: Behavior normal.    ED Results / Procedures / Treatments   Labs (all labs ordered are listed, but only abnormal results are displayed) Labs Reviewed  COMPREHENSIVE METABOLIC PANEL - Abnormal; Notable for the following components:      Result Value   Potassium 3.4 (*)    CO2 21 (*)    Glucose, Bld 165 (*)    AST 71 (*)    ALT 56 (*)    All other components within normal limits  CBC - Abnormal; Notable for the following components:   RBC 4.19 (*)    MCV 103.6 (*)    MCH 35.8 (*)    All other components within normal limits  URINALYSIS, ROUTINE W REFLEX MICROSCOPIC - Abnormal; Notable for the following components:   Color, Urine STRAW (*)    Specific Gravity, Urine 1.003 (*)    All other components within normal limits  LIPASE, BLOOD    EKG None  Radiology CT ABDOMEN PELVIS W CONTRAST  Result Date:  09/23/2021 CLINICAL DATA:  Acute abdominal pain.  Pain for 2 weeks. EXAM: CT ABDOMEN AND PELVIS WITH CONTRAST TECHNIQUE: Multidetector CT imaging of the abdomen and pelvis was performed using the standard protocol following bolus administration of intravenous contrast. RADIATION DOSE REDUCTION: This exam was performed according to the departmental dose-optimization program which includes automated exposure control, adjustment of the mA and/or kV according to patient size and/or use of iterative reconstruction technique. CONTRAST:  136mL OMNIPAQUE IOHEXOL  300 MG/ML  SOLN COMPARISON:  CT 11/14/2020 FINDINGS: Lower chest: Hypoventilatory changes. No acute airspace disease. No pleural effusion. Hepatobiliary: Advanced hepatic steatosis with more focal areas of fatty infiltration and fatty sparing. Enlarged liver spanning 22.3 cm. Gallbladder physiologically distended, no calcified stone. No biliary dilatation. Pancreas: No ductal dilatation or inflammation. Spleen: Normal in size without focal abnormality. Adrenals/Urinary Tract: Normal adrenal glands. No hydronephrosis or perinephric edema. Homogeneous renal enhancement with symmetric excretion on delayed phase imaging. No renal calculi. Unchanged appearance of bilateral renal cysts. Urinary bladder is physiologically distended without wall thickening. Stomach/Bowel: Acute sigmoid diverticulitis with associated colonic wall thickening and pericolonic edema, series 2, image 70. This is at site of prior diverticulitis. No perforation or abscess. Innumerable additional noninflamed colonic diverticula. Sigmoid colon is redundant. Moderate colonic stool burden. Normal appendix. No small bowel obstruction or inflammation. Small umbilical hernia contains nonobstructed noninflamed small bowel there is a small hiatal hernia. The stomach is decompressed. Vascular/Lymphatic: Aortic atherosclerosis. No aortic aneurysm. Patent portal, splenic, and mesenteric veins. There are small  mesenteric nodes measuring up to 7 mm in the sigmoid mesentery. 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 Other: No free air, free fluid, or intra-abdominal fluid collection. Fat containing bilateral inguinal hernias. Umbilical hernia contains nonobstructed noninflamed small bowel. Musculoskeletal: Degenerative disc disease at L4-L5 and L5-S1. Bilateral hip arthroplasties. There are no acute or suspicious osseous abnormalities. IMPRESSION: 1. Acute sigmoid diverticulitis without perforation or abscess. 2. Advanced hepatic steatosis and hepatomegaly Aortic Atherosclerosis (ICD10-I70.0). Electronically Signed   By: Keith Rake M.D.   On: 09/23/2021 22:41    Procedures Procedures    Medications Ordered in ED Medications  sodium chloride 0.9 % bolus 500 mL (0 mLs Intravenous Stopped 09/23/21 2253)  HYDROmorphone (DILAUDID) injection 0.5 mg (0.5 mg Intravenous Given 09/23/21 2155)  ondansetron (ZOFRAN) injection 4 mg (4 mg Intravenous Given 09/23/21 2155)  iohexol (OMNIPAQUE) 300 MG/ML solution 100 mL (100 mLs Intravenous Contrast Given 09/23/21 2210)    ED Course/ Medical Decision Making/ A&P                           Medical Decision Making Amount and/or Complexity of Data Reviewed Labs: ordered. Radiology: ordered.  Risk Prescription drug management. Decision regarding hospitalization.     Patient with diverticulitis.  He failed outpatient treatment with Cipro and Flagyl.  He will be admitted for IV antibiotic   This patient presents to the ED for concern of abdominal pain, this involves an extensive number of treatment options, and is a complaint that carries with it a high risk of complications and morbidity.  The differential diagnosis includes diverticulitis, appendicitis   Co morbidities that complicate the patient evaluation  Obesity, GERD   Additional history obtained:  Additional history obtained from his PCP External records from outside source obtained and  reviewed including hospital records   Lab Tests:  I Ordered, and personally interpreted labs.  The pertinent results include: CBC and chemistries which were unremarkable   Imaging Studies ordered:  I ordered imaging studies including CT abdomen I independently visualized and interpreted imaging which showed diverticuli I agree with the radiologist interpretation   Cardiac Monitoring:  The patient was maintained on a cardiac monitor.  I personally viewed and interpreted the cardiac monitored which showed an underlying rhythm of: Normal sinus rhythm   Medicines ordered and prescription drug management:  I ordered medication including Cipro and Flagyl for diverticulitis Reevaluation of the patient after these  medicines showed that the patient stayed the same I have reviewed the patients home medicines and have made adjustments as needed   Test Considered:  MRI of the abdomen   Critical Interventions:  None   Consultations Obtained:  I requested consultation with the hospital,  and discussed lab and imaging findings as well as pertinent plan - they recommend: Admission to the hospital with IV antibiotic   Problem List / ED Course:  Diverticulitis   Reevaluation:  After the interventions noted above, I reevaluated the patient and found that they have :stayed the same   Social Determinants of Health:  None   Dispostion:  After consideration of the diagnostic results and the patients response to treatment, I feel that the patent would benefit from admission to the hospital with IV antibiotics.         Final Clinical Impression(s) / ED Diagnoses Final diagnoses:  Diverticulitis    Rx / DC Orders ED Discharge Orders     None         Milton Ferguson, MD 09/24/21 [phone]

## 2021-09-23 NOTE — ED Notes (Signed)
To Ct 

## 2021-09-23 NOTE — ED Triage Notes (Signed)
Pt c/o severe abd pain for the past 2 weeks. Pt has been taking oral abx with no improvement. Pt sent by PCP for CT abd for diverticulitis. Hx of same.

## 2021-09-23 NOTE — ED Notes (Signed)
Patient placed on 2L [place] due to decreased oxygen saturation after pain medication.

## 2021-09-24 ENCOUNTER — Encounter (HOSPITAL_COMMUNITY): Payer: Self-pay | Admitting: Family Medicine

## 2021-09-24 ENCOUNTER — Other Ambulatory Visit: Payer: Self-pay

## 2021-09-24 DIAGNOSIS — E782 Mixed hyperlipidemia: Secondary | ICD-10-CM

## 2021-09-24 DIAGNOSIS — J449 Chronic obstructive pulmonary disease, unspecified: Secondary | ICD-10-CM

## 2021-09-24 DIAGNOSIS — Z86718 Personal history of other venous thrombosis and embolism: Secondary | ICD-10-CM

## 2021-09-24 DIAGNOSIS — K5792 Diverticulitis of intestine, part unspecified, without perforation or abscess without bleeding: Secondary | ICD-10-CM

## 2021-09-24 DIAGNOSIS — E876 Hypokalemia: Secondary | ICD-10-CM

## 2021-09-24 DIAGNOSIS — G4733 Obstructive sleep apnea (adult) (pediatric): Secondary | ICD-10-CM

## 2021-09-24 DIAGNOSIS — Z9989 Dependence on other enabling machines and devices: Secondary | ICD-10-CM

## 2021-09-24 LAB — MAGNESIUM: Magnesium: 1.8 mg/dL (ref 1.7–2.4)

## 2021-09-24 LAB — COMPREHENSIVE METABOLIC PANEL
ALT: 46 U/L — ABNORMAL HIGH (ref 0–44)
AST: 58 U/L — ABNORMAL HIGH (ref 15–41)
Albumin: 3.2 g/dL — ABNORMAL LOW (ref 3.5–5.0)
Alkaline Phosphatase: 53 U/L (ref 38–126)
Anion gap: 5 (ref 5–15)
BUN: 10 mg/dL (ref 8–23)
CO2: 25 mmol/L (ref 22–32)
Calcium: 8.7 mg/dL — ABNORMAL LOW (ref 8.9–10.3)
Chloride: 109 mmol/L (ref 98–111)
Creatinine, Ser: 0.81 mg/dL (ref 0.61–1.24)
GFR, Estimated: >60 mL/min (ref >60)
Glucose, Bld: 95 mg/dL (ref 70–99)
Potassium: 3.7 mmol/L (ref 3.5–5.1)
Sodium: 139 mmol/L (ref 135–145)
Total Bilirubin: 0.6 mg/dL (ref 0.3–1.2)
Total Protein: 6 g/dL — ABNORMAL LOW (ref 6.5–8.1)

## 2021-09-24 LAB — CBC WITH DIFFERENTIAL/PLATELET
Abs Immature Granulocytes: 0.04 10*3/uL (ref 0.00–0.07)
Basophils Absolute: 0.1 10*3/uL (ref 0.0–0.1)
Basophils Relative: 1 %
Eosinophils Absolute: 0.3 10*3/uL (ref 0.0–0.5)
Eosinophils Relative: 4 %
HCT: 40.8 % (ref 39.0–52.0)
Hemoglobin: 13.4 g/dL (ref 13.0–17.0)
Immature Granulocytes: 1 %
Lymphocytes Relative: 32 %
Lymphs Abs: 2.3 10*3/uL (ref 0.7–4.0)
MCH: 34 pg (ref 26.0–34.0)
MCHC: 32.8 g/dL (ref 30.0–36.0)
MCV: 103.6 fL — ABNORMAL HIGH (ref 80.0–100.0)
Monocytes Absolute: 0.7 10*3/uL (ref 0.1–1.0)
Monocytes Relative: 10 %
Neutro Abs: 3.9 10*3/uL (ref 1.7–7.7)
Neutrophils Relative %: 52 %
Platelets: 154 10*3/uL (ref 150–400)
RBC: 3.94 MIL/uL — ABNORMAL LOW (ref 4.22–5.81)
RDW: 12.8 % (ref 11.5–15.5)
WBC: 7.3 10*3/uL (ref 4.0–10.5)
nRBC: 0 % (ref 0.0–0.2)

## 2021-09-24 LAB — RESP PANEL BY RT-PCR (FLU A&B, COVID) ARPGX2
Influenza A by PCR: NEGATIVE
Influenza B by PCR: NEGATIVE
SARS Coronavirus 2 by RT PCR: NEGATIVE

## 2021-09-24 MED ORDER — UMECLIDINIUM BROMIDE 62.5 MCG/ACT IN AEPB
1.0000 | INHALATION_SPRAY | Freq: Every day | RESPIRATORY_TRACT | Status: DC
Start: 2021-09-24 — End: 2021-09-26
  Administered 2021-09-24 – 2021-09-26 (×3): 1 via RESPIRATORY_TRACT
  Filled 2021-09-24: qty 7

## 2021-09-24 MED ORDER — ONDANSETRON HCL 4 MG/2ML IJ SOLN
4.0000 mg | Freq: Four times a day (QID) | INTRAMUSCULAR | Status: DC | PRN
  Administered 2021-09-24: 4 mg via INTRAVENOUS
  Filled 2021-09-24: qty 2

## 2021-09-24 MED ORDER — POLYETHYLENE GLYCOL 3350 17 G PO PACK
17.0000 g | PACK | Freq: Every day | ORAL | Status: DC
  Administered 2021-09-24 – 2021-09-26 (×3): 17 g via ORAL
  Filled 2021-09-24 (×3): qty 1

## 2021-09-24 MED ORDER — APIXABAN 5 MG PO TABS
5.0000 mg | ORAL_TABLET | Freq: Two times a day (BID) | ORAL | Status: DC
  Administered 2021-09-24 – 2021-09-26 (×6): 5 mg via ORAL
  Filled 2021-09-24 (×6): qty 1

## 2021-09-24 MED ORDER — ALBUTEROL SULFATE (2.5 MG/3ML) 0.083% IN NEBU: 2.5000 mg | INHALATION_SOLUTION | Freq: Four times a day (QID) | RESPIRATORY_TRACT | Status: DC | PRN

## 2021-09-24 MED ORDER — OXYCODONE HCL 5 MG PO TABS
5.0000 mg | ORAL_TABLET | ORAL | Status: DC | PRN
  Administered 2021-09-24 – 2021-09-26 (×5): 5 mg via ORAL
  Filled 2021-09-24 (×5): qty 1

## 2021-09-24 MED ORDER — POTASSIUM CHLORIDE 10 MEQ/100ML IV SOLN
10.0000 meq | INTRAVENOUS | Status: AC
  Administered 2021-09-24 (×2): 10 meq via INTRAVENOUS
  Filled 2021-09-24 (×2): qty 100

## 2021-09-24 MED ORDER — MORPHINE SULFATE (PF) 2 MG/ML IV SOLN
2.0000 mg | INTRAVENOUS | Status: DC | PRN
  Administered 2021-09-24 (×4): 2 mg via INTRAVENOUS
  Filled 2021-09-24 (×4): qty 1

## 2021-09-24 MED ORDER — GABAPENTIN 300 MG PO CAPS
300.0000 mg | ORAL_CAPSULE | Freq: Every day | ORAL | Status: DC
  Administered 2021-09-24 – 2021-09-26 (×3): 300 mg via ORAL
  Filled 2021-09-24 (×3): qty 1

## 2021-09-24 MED ORDER — METOPROLOL SUCCINATE ER 25 MG PO TB24
25.0000 mg | ORAL_TABLET | Freq: Every day | ORAL | Status: DC
  Administered 2021-09-24 – 2021-09-26 (×3): 25 mg via ORAL
  Filled 2021-09-24 (×3): qty 1

## 2021-09-24 MED ORDER — ACETAMINOPHEN 650 MG RE SUPP: 650.0000 mg | Freq: Four times a day (QID) | RECTAL | Status: DC | PRN

## 2021-09-24 MED ORDER — FLUTICASONE FUROATE-VILANTEROL 100-25 MCG/ACT IN AEPB
1.0000 | INHALATION_SPRAY | Freq: Every day | RESPIRATORY_TRACT | Status: DC
  Administered 2021-09-24 – 2021-09-26 (×3): 1 via RESPIRATORY_TRACT
  Filled 2021-09-24: qty 28

## 2021-09-24 MED ORDER — ACETAMINOPHEN 325 MG PO TABS
650.0000 mg | ORAL_TABLET | Freq: Four times a day (QID) | ORAL | Status: DC | PRN
  Administered 2021-09-24 – 2021-09-25 (×3): 650 mg via ORAL
  Filled 2021-09-24 (×3): qty 2

## 2021-09-24 MED ORDER — ONDANSETRON HCL 4 MG PO TABS: 4.0000 mg | ORAL_TABLET | Freq: Four times a day (QID) | ORAL | Status: DC | PRN

## 2021-09-24 MED ORDER — SODIUM CHLORIDE 0.9 % IV SOLN: INTRAVENOUS | Status: DC

## 2021-09-24 MED ORDER — ROSUVASTATIN CALCIUM 20 MG PO TABS
20.0000 mg | ORAL_TABLET | Freq: Every day | ORAL | Status: DC
  Administered 2021-09-24 – 2021-09-26 (×3): 20 mg via ORAL
  Filled 2021-09-24 (×3): qty 1

## 2021-09-24 MED ORDER — DOCUSATE SODIUM 100 MG PO CAPS
200.0000 mg | ORAL_CAPSULE | Freq: Two times a day (BID) | ORAL | Status: DC | PRN
Start: 2021-09-24 — End: 2021-09-26
  Administered 2021-09-24: 200 mg via ORAL
  Filled 2021-09-24: qty 2

## 2021-09-24 NOTE — Assessment & Plan Note (Addendum)
Patient failed outpatient antibiotic regimen of Cipro and Flagyl Treated with IV Zosyn while inpatient Transition to oral Augmentin for the next 14 days and follow-up with GI outpatient

## 2021-09-24 NOTE — Hospital Course (Addendum)
66 y.o. male with medical history significant of with history of COPD, diverticulitis, DVT, heterozygous factor V Leiden mutation, GERD, and more presents the ED with a chief complaint of abdominal pain patient reports he has had left lower quadrant and groin pain for 2-3 weeks.  He was the same for 2 weeks then became worse over the last week.  Patient denies any diarrhea, hematochezia, melena, nausea, vomiting.  He reports he has normal appetite.  He has been seen in the outpatient setting and was prescribed Cipro and Flagyl.  It has not relieved his pain.  He is actually gotten worse while he has been on it.  He has taken Tylenol at home for the pain and offers no relief.  Patient describes his pain as a burning pain in the left lower quadrant.  It radiates down to his groin.  It hurts to activate his hip flexors on the left side.  Patient initially thought something was wrong with his hip and had appointment with Ortho for hip x-rays but nothing was revealed.  Patient reports that his last hospitalization there was discussion of surgery with diarrhea and ostomy.  Patient did not want to have that done and opted for 6 weeks of antibiotics instead.  Patient has no other complaints at this time.    09/24/2021:  GI consulted for their recommendations regarding consideration of surgical referral given severity and recurrent nature of his diverticulitis. Continue supportive care with IV Zosyn, fluids, pain management.    2/22 - 2/23: Patient was continued on IV Zosyn and diet was advanced.  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  Hep B studies have returned negative.  Hep C studies are pending.  He is currently tolerating diet and having bowel movements and in stable condition to discharge and transition to oral Augmentin for the next 14 days and follow-up with GI and surgery in the  outpatient setting.  No other acute events noted throughout the course of the stay.

## 2021-09-24 NOTE — Progress Notes (Signed)
°  Transition of Care [facility]) Screening Note   Patient Details  Name: [name] Date of Birth: [birth year]   Transition of Care [facility]) CM/SW Contact:    Boneta Lucks, RN Phone Number: 09/24/2021, 1:31 PM    Transition of Care Department [facility]) has reviewed patient and no TOC needs have been identified at this time. We will continue to monitor patient advancement through interdisciplinary progression rounds. If new patient transition needs arise, please place a TOC consult.

## 2021-09-24 NOTE — H&P (Signed)
History and Physical    Patient: [name] [id number]:[id number] DOB: 1956/07/01 DOA: 09/23/2021 DOS: the patient was seen and examined on 09/24/2021 PCP: [name], MD  Patient coming from: Home  Chief Complaint:  Chief Complaint  Patient presents with   Abdominal Pain    HPI: [name] is a 66 y.o. male with medical history significant of with history of COPD, diverticulitis, DVT, heterozygous factor V Leiden mutation, GERD, and more presents the ED with a chief complaint of abdominal pain patient reports he has had left lower quadrant and groin pain for 2-3 weeks.  He was the same for 2 weeks then became worse over the last week.  Patient denies any diarrhea, hematochezia, melena, nausea, vomiting.  He reports he has normal appetite.  He has been seen in the outpatient setting and was prescribed Cipro and Flagyl.  It has not relieved his pain.  He is actually gotten worse while he has been on it.  He has taken Tylenol at home for the pain and offers no relief.  Patient describes his pain as a burning pain in the left lower quadrant.  It radiates down to his groin.  It hurts to activate his hip flexors on the left side.  Patient initially thought something was wrong with his hip and had appointment with Ortho for hip x-rays but nothing was revealed.  Patient reports that his last hospitalization there was discussion of surgery with diarrhea and ostomy.  Patient did not want to have that done and opted for 6 weeks of antibiotics instead.  Patient has no other complaints at this time.  Review of Systems: As mentioned in the history of present illness. All other systems reviewed and are negative. Past Medical History:  Diagnosis Date   Bilateral shoulder bursitis    Chronic back pain    COPD (chronic obstructive pulmonary disease) with emphysema ([name])    DDD (degenerative disc disease), lumbar    hx epideral injection  L5--S1    Diverticulitis    DVT (deep venous thrombosis) ([place])  11/19/2018   Dysphasia    intermittant w/ food    Dyspnea    Dyspnea on exertion    GERD (gastroesophageal reflux disease)    Gout    L great toe   Heterozygous factor V Leiden mutation ([place])    Hiatal hernia    Hip problem 2020   left, seeing orthopedics   History of acute pancreatitis    alcoholic pancreatitis [id number] and 01-30-2012   History of colon polyps    10-17-2005  hyperplastic polyp's   History of esophageal stricture    s/p  dilation 02-02-2017   History of peptic ulcer 1990s   History of pneumococcal pneumonia 2006   bilateral pneumonia w/ left lower lobe abscess treated w/ chest tube with suction for drainage   History of pneumothorax    1984--  left spontaneous pneumothorax ,  treated w/ chest tube   Malignant neoplasm of prostate [facility]) urologist-  dr dahlstedt/  oncologist -- dr Tammi Klippel   dx 12-02-2016-- Stage T2b, Gleason 3+4,  PSA 5.99,  vol 25cc   Numbness of left foot    outer left ankle numb-- per pt has appt. w/ neurologist   OSA (obstructive sleep apnea)    per pt has not used cpap over year ago from 04-16-2017--- per study 01-17-2010  severe osa   Solid nodule of lung greater than 8 mm in diameter 05/11/2018   03/2018: RUL Nodule, 58mm  Wears glasses    Past Surgical History:  Procedure Laterality Date   BIOPSY  02/02/2017   Procedure: BIOPSY;  Surgeon: Daneil Dolin, MD;  [facility];  Service: Endoscopy;;  duodenal bx's, esophgeal bx's   BIOPSY  07/30/2017   Procedure: BIOPSY;  Surgeon: Daneil Dolin, MD;  [facility];  Service: Endoscopy;;  esophagus   BIOPSY  02/15/2021   Procedure: BIOPSY;  Surgeon: Daneil Dolin, MD;  [facility];  Service: Endoscopy;;   CARDIOVASCULAR STRESS TEST  09/12/2009   normal nuclear perfusion study w/ no ischemia /  normal LV function and wall motion , ef 57%   COLONOSCOPY  2007   Dr. Gala Romney: hyperplastic polyps, internal hemorrhoids    COLONOSCOPY WITH PROPOFOL N/A 05/03/2018    Dr. Gala Romney: diverticulosis in entire colon, multiple polyps in sigmoid, at splenic flexure, and ascending colon, not all polyps removed. Tubular adenomas and inflammatory polyps. Needs 3 year surveilance   COLONOSCOPY WITH PROPOFOL N/A 02/15/2021   Procedure: COLONOSCOPY WITH PROPOFOL;  Surgeon: Daneil Dolin, MD;  [facility];  Service: Endoscopy;  Laterality: N/A;  2:30pm   EPIDURAL BLOCK INJECTION  2020   ESOPHAGOGASTRODUODENOSCOPY (EGD) WITH PROPOFOL N/A 02/02/2017   Dr. Gala Romney: esophageal stenosis s/p dilation and biopsy, medium sized hiatal hernia, normal duodenum   ESOPHAGOGASTRODUODENOSCOPY (EGD) WITH PROPOFOL N/A 07/30/2017   Dr. Gala Romney: esophageal stenosis s/p dilation and biopsy, medium-sized hiatal hernia, normal duodenum   HEMORRHOID SURGERY  01-21-2008    [facility]   INTRAVASCULAR PRESSURE WIRE/FFR STUDY N/A 08/01/2021   Procedure: INTRAVASCULAR PRESSURE WIRE/FFR STUDY;  Surgeon: Burnell Blanks, MD;  Location: [place] [facility];  Service: Cardiovascular;  Laterality: N/A;   IR RADIOLOGIST EVAL & MGMT  08/29/2020   LEFT HEART CATH AND CORONARY ANGIOGRAPHY N/A 08/01/2021   Procedure: LEFT HEART CATH AND CORONARY ANGIOGRAPHY;  Surgeon: Burnell Blanks, MD;  Location: [place] [facility];  Service: Cardiovascular;  Laterality: N/A;   MALONEY DILATION N/A 02/02/2017   Procedure: Venia Minks DILATION;  Surgeon: Daneil Dolin, MD;  [facility];  Service: Endoscopy;  Laterality: N/A;   MALONEY DILATION N/A 07/30/2017   Procedure: Venia Minks DILATION;  Surgeon: Daneil Dolin, MD;  [facility];  Service: Endoscopy;  Laterality: N/A;   POLYPECTOMY  05/03/2018   Procedure: POLYPECTOMY;  Surgeon: Daneil Dolin, MD;  [facility];  Service: Endoscopy;;  ascending colon polyp, sigmoid colon polyps (multiple)   POLYPECTOMY  02/15/2021   Procedure: POLYPECTOMY;  Surgeon: Daneil Dolin, MD;  [facility];  Service: Endoscopy;;    RADIOACTIVE SEED IMPLANT N/A 04/23/2017   Procedure: RADIOACTIVE SEED IMPLANT/BRACHYTHERAPY IMPLANT;  Surgeon: Franchot Gallo, MD;  Location[facility];  Service: Urology;  Laterality: N/A;   SHOULDER ARTHROSCOPY  08/05/2003   SPACE OAR INSTILLATION N/A 04/23/2017   Procedure: SPACE OAR INSTILLATION;  Surgeon: Franchot Gallo, MD;  Location: [facility];  Service: Urology;  Laterality: N/A;   TRANSTHORACIC ECHOCARDIOGRAM  03/28/2009   mild LVH, ef 55-60%/ mild aorta calcification   VASECTOMY  08/05/1983   VIDEO ASSISTED THORACOSCOPY (VATS)/DECORTICATION Left    Social History:  reports that he quit smoking about 5 years ago. His smoking use included cigarettes and e-cigarettes. He has a 30.00 pack-year smoking history. He has never used smokeless tobacco. He reports current alcohol use of about 5.0 standard drinks per week. He reports that he  does not use drugs.  No Known Allergies  Family History  Problem Relation Age of Onset   Diabetes Mother    Heart disease Father    Heart disease Brother    Cancer Neg Hx    Colon cancer Neg Hx    Colon polyps Neg Hx    Neuropathy Neg Hx     Prior to Admission medications   Medication Sig Start Date End Date Taking? Authorizing Provider  acetaminophen (TYLENOL) 500 MG tablet Take 2 tablets (1,000 mg total) by mouth every 6 (six) hours as needed for mild pain. 08/21/20  Yes Meuth, Brooke A, PA-C  albuterol (PROVENTIL) (2.5 MG/3ML) 0.083% nebulizer solution Take 2.5 mg by nebulization every 6 (six) hours as needed for wheezing or shortness of breath.   Yes [provider]  albuterol (VENTOLIN HFA) 108 (90 Base) MCG/ACT inhaler Inhale 2 puffs into the lungs every 6 (six) hours as needed for wheezing or shortness of breath.   Yes [provider]  allopurinol (ZYLOPRIM) 300 MG tablet Take 300 mg by mouth daily. 09/17/20  Yes [provider]  apixaban (ELIQUIS) 5 MG TABS tablet Take 1 tablet  (5 mg total) by mouth 2 (two) times daily. 08/25/21 09/24/21 Yes Shah, Pratik D, DO  Artificial Tear Solution (SOOTHE XP OP) Place 1 drop into both eyes daily as needed (Dry eyes).   Yes [provider]  ciprofloxacin (CIPRO) 500 MG tablet Take 500 mg by mouth 2 (two) times daily. 09/13/21  Yes [provider]  doxycycline (VIBRAMYCIN) 50 MG capsule Take 50 mg by mouth daily. 08/21/21  Yes [provider]  Fluticasone-Umeclidin-Vilant (TRELEGY ELLIPTA) 100-62.5-25 MCG/ACT AEPB Inhale 1 puff into the lungs daily. 07/11/21  Yes Icard, Bradley L, DO  gabapentin (NEURONTIN) 300 MG capsule Take 300 mg by mouth daily. 09/10/21  Yes [provider]  metoprolol succinate (TOPROL XL) 25 MG 24 hr tablet Take 1 tablet (25 mg total) by mouth daily. 07/26/21  Yes Richardo Priest, MD  psyllium (METAMUCIL SMOOTH TEXTURE) 28 % packet Take 1 packet by mouth daily.   Yes [provider]  rosuvastatin (CRESTOR) 20 MG tablet Take 1 tablet (20 mg total) by mouth daily. 07/26/21 10/24/21 Yes Richardo Priest, MD  apixaban (ELIQUIS) 5 MG TABS tablet Take 2 tablets (10 mg total) by mouth 2 (two) times daily for 7 days. Patient not taking: Reported on 09/23/2021 08/18/21 09/23/21  Heath Lark D, DO    Physical Exam: Vitals:   09/23/21 2300 09/24/21 0000 09/24/21 0032 09/24/21 0503  BP: 118/72 122/84 (!) 143/87 119/74  Pulse: 65 65 66 (!) 58  Resp: 12 17 16 18   Temp:   98.6 F (37 C) 97.7 F (36.5 C)  TempSrc:    Oral  SpO2: 100% 99% 96% 94%  Weight:      Height:       1.  General: Patient lying supine in bed,  no acute distress   2. Psychiatric: Alert and oriented x 3, mood and behavior normal for situation, pleasant and cooperative with exam   3. Neurologic: Speech and language are normal, face is symmetric, moves all 4 extremities voluntarily, at baseline without acute deficits on limited exam   4. HEENMT:  Head is atraumatic, normocephalic, pupils reactive to light,  neck is supple, trachea is midline, mucous membranes are moist   5. Respiratory : Lungs are clear to auscultation bilaterally without wheezing, rhonchi, rales, no cyanosis, no increase in work of breathing  or accessory muscle use   6. Cardiovascular : Heart rate normal, rhythm is regular, no murmurs, rubs or gallops, no peripheral edema, peripheral pulses palpated   7. Gastrointestinal:  Abdomen is soft, nondistended, left lower quadrant tenderness, bowel sounds active, no masses or organomegaly palpated   8. Skin:  Skin is warm, dry and intact without rashes, acute lesions, venous stasis changes in the bilateral lower extremities   9.Musculoskeletal:  No acute deformities or trauma, no asymmetry in tone, no peripheral edema, peripheral pulses palpated, no tenderness to palpation in the extremities   Data Reviewed: In the ED Temp 97.8, heart rate 75-63, respiratory rate 12-20, blood pressure 116/80-137/81 No leukocytosis with white blood cell count of 8.0, hemoglobin 15.0 Chemistry reveals a hypokalemia at 3.4 AST 71 ALT 56 UA is not indicative of UTI CT abdomen pelvis shows sigmoid diverticulitis without perforation or abscess 500 mL bolus given Zofran given Dilaudid given  Assessment and Plan: * Diverticulitis- (present on admission) Patient failed outpatient antibiotic regimen of Cipro and Flagyl I discussed with pharmacy and we determined that Zosyn would be the next best step Start Zosyn Continue to monitor CBC for leukocytosis, CMP for any electrolyte abnormalities Continue to monitor  Mixed hyperlipidemia- (present on admission) Continue Crestor  History of DVT (deep vein thrombosis) With history of DVTs and pulmonary emboli and heterozygous factor V Leiden mutation Continue Eliquis  Hypokalemia- (present on admission) Potassium 3.4, 20 mEq IV potassium given in the ED Likely secondary to poor p.o. intake as patient has been trying trying to avoid fruits and  vegetables since he has been in a diverticulitis flare Recheck in the a.m.       Advance Care Planning:   Code Status: Full Code   Consults: None  Family Communication: No family at bedside  Severity of Illness: The appropriate patient status for this patient is INPATIENT. Inpatient status is judged to be reasonable and necessary in order to provide the required intensity of service to ensure the patient's safety. The patient's presenting symptoms, physical exam findings, and initial radiographic and laboratory data in the context of their chronic comorbidities is felt to place them at high risk for further clinical deterioration. Furthermore, it is not anticipated that the patient will be medically stable for discharge from the hospital within 2 midnights of admission.   * I certify that at the point of admission it is my clinical judgment that the patient will require inpatient hospital care spanning beyond 2 midnights from the point of admission due to high intensity of service, high risk for further deterioration and high frequency of surveillance required.*  Author: Rolla Plate, DO 09/24/2021 5:22 AM  For on call review [email or web address].

## 2021-09-24 NOTE — Assessment & Plan Note (Addendum)
Resolved, follow-up outpatient

## 2021-09-24 NOTE — Plan of Care (Signed)
Patient received on floor via stretcher Aox4, ambulatory, NPO status implemented, skin check performed, patient oriented to room, staff, call bell, and urinal given.   Problem: Education: Goal: Knowledge of General Education information will improve Description: Including pain rating scale, medication(s)/side effects and non-pharmacologic comfort measures Outcome: Progressing   Problem: Health Behavior/Discharge Planning: Goal: Ability to manage health-related needs will improve Outcome: Progressing   Problem: Clinical Measurements: Goal: Ability to maintain clinical measurements within normal limits will improve Outcome: Progressing Goal: Will remain free from infection Outcome: Progressing Goal: Diagnostic test results will improve Outcome: Progressing Goal: Respiratory complications will improve Outcome: Progressing Goal: Cardiovascular complication will be avoided Outcome: Progressing   Problem: Activity: Goal: Risk for activity intolerance will decrease Outcome: Progressing   Problem: Nutrition: Goal: Adequate nutrition will be maintained Outcome: Progressing   Problem: Coping: Goal: Level of anxiety will decrease Outcome: Progressing   Problem: Elimination: Goal: Will not experience complications related to bowel motility Outcome: Progressing Goal: Will not experience complications related to urinary retention Outcome: Progressing   Problem: Pain Managment: Goal: General experience of comfort will improve Outcome: Progressing   Problem: Safety: Goal: Ability to remain free from injury will improve Outcome: Progressing   Problem: Skin Integrity: Goal: Risk for impaired skin integrity will decrease Outcome: Progressing

## 2021-09-24 NOTE — Assessment & Plan Note (Addendum)
Resume home bronchodilators and use as needed at home

## 2021-09-24 NOTE — Assessment & Plan Note (Addendum)
With history of DVTs and pulmonary emboli and heterozygous factor V Leiden mutation Continue Eliquis (apixaban) for full anticoagulation

## 2021-09-24 NOTE — Assessment & Plan Note (Addendum)
CPAP at home.

## 2021-09-24 NOTE — Assessment & Plan Note (Signed)
Continue Crestor 

## 2021-09-24 NOTE — Progress Notes (Signed)
[place] OF CARE NOTE   09/24/2021 2:49 PM  [name] was seen and examined.  The H&P by the admitting provider, orders, imaging was reviewed.  Please see new orders.  Will continue to follow.    66 y.o. male with medical history significant of with history of COPD, diverticulitis, DVT, heterozygous factor V Leiden mutation, GERD, and more presents the ED with a chief complaint of abdominal pain patient reports he has had left lower quadrant and groin pain for 2-3 weeks.  He was the same for 2 weeks then became worse over the last week.  Patient denies any diarrhea, hematochezia, melena, nausea, vomiting.  He reports he has normal appetite.  He has been seen in the outpatient setting and was prescribed Cipro and Flagyl.  It has not relieved his pain.  He is actually gotten worse while he has been on it.  He has taken Tylenol at home for the pain and offers no relief.  Patient describes his pain as a burning pain in the left lower quadrant.  It radiates down to his groin.  It hurts to activate his hip flexors on the left side.  Patient initially thought something was wrong with his hip and had appointment with Ortho for hip x-rays but nothing was revealed.  Patient reports that his last hospitalization there was discussion of surgery with diarrhea and ostomy.  Patient did not want to have that done and opted for 6 weeks of antibiotics instead.  Patient has no other complaints at this time.    09/24/2021:  GI consulted for their recommendations regarding consideration of surgical referral given severity and recurrent nature of his diverticulitis. Continue supportive care with IV Zosyn, fluids, pain management.     Assessment and Plan: * Diverticulitis- (present on admission) Patient failed outpatient antibiotic regimen of Cipro and Flagyl I discussed with pharmacy and we determined that Zosyn would be the next best step Start Zosyn IV and other supportive measures.  Continue to monitor CBC for  leukocytosis, CMP for any electrolyte abnormalities GI consult requested for their recommendation about need for surgical referral for definitive management of this problem that has been difficult to treat medically.   History of DVT (deep vein thrombosis) With history of DVTs and pulmonary emboli and heterozygous factor V Leiden mutation Continue Eliquis (apixaban) for full anticoagulation   Hypokalemia- (present on admission) Potassium 3.4, 20 mEq IV potassium given in the ED and REPLETED Secondary to poor oral intake as patient has been trying trying to avoid fruits and vegetables since he has been in a diverticulitis flare Follow K and Mg closely for next couple of days.   Mixed hyperlipidemia- (present on admission) Continue Crestor  Obstructive sleep apnea treated with continuous positive airway pressure (CPAP) Will offer nightly cpap while in hospital   COPD (chronic obstructive pulmonary disease) ([place])- (present on admission) Resume home bronchodilators     Vitals:   09/24/21 0843 09/24/21 1218  BP: 125/81 106/65  Pulse: 61 (!) 59  Resp: 18 18  Temp: 97.7 F (36.5 C) 98.1 F (36.7 C)  SpO2: 97% 92%   Exam: persistent LLQ tenderness with light palpation.   Results for orders placed or performed during the hospital encounter of 09/23/21  Resp Panel by RT-PCR (Flu A&B, Covid) Nasopharyngeal Swab   Specimen: Nasopharyngeal Swab; Nasopharyngeal(NP) swabs in vial transport medium  Result Value Ref Range   SARS Coronavirus 2 by RT PCR NEGATIVE NEGATIVE   Influenza A by PCR NEGATIVE NEGATIVE  Influenza B by PCR NEGATIVE NEGATIVE  Lipase, blood  Result Value Ref Range   Lipase 36 11 - 51 U/L  Comprehensive metabolic panel  Result Value Ref Range   Sodium 136 135 - 145 mmol/L   Potassium 3.4 (L) 3.5 - 5.1 mmol/L   Chloride 106 98 - 111 mmol/L   CO2 21 (L) 22 - 32 mmol/L   Glucose, Bld 165 (H) 70 - 99 mg/dL   BUN 12 8 - 23 mg/dL   Creatinine, Ser 0.87 0.61 - 1.24  mg/dL   Calcium 9.2 8.9 - 10.3 mg/dL   Total Protein 7.1 6.5 - 8.1 g/dL   Albumin 3.8 3.5 - 5.0 g/dL   AST 71 (H) 15 - 41 U/L   ALT 56 (H) 0 - 44 U/L   Alkaline Phosphatase 62 38 - 126 U/L   Total Bilirubin 0.6 0.3 - 1.2 mg/dL   GFR, Estimated >60 >60 mL/min   Anion gap 9 5 - 15  CBC  Result Value Ref Range   WBC 8.0 4.0 - 10.5 K/uL   RBC 4.19 (L) 4.22 - 5.81 MIL/uL   Hemoglobin 15.0 13.0 - 17.0 g/dL   HCT 43.4 39.0 - 52.0 %   MCV 103.6 (H) 80.0 - 100.0 fL   MCH 35.8 (H) 26.0 - 34.0 pg   MCHC 34.6 30.0 - 36.0 g/dL   RDW 12.9 11.5 - 15.5 %   Platelets 171 150 - 400 K/uL   nRBC 0.0 0.0 - 0.2 %  Urinalysis, Routine w reflex microscopic Urine, Clean Catch  Result Value Ref Range   Color, Urine STRAW (A) YELLOW   APPearance CLEAR CLEAR   Specific Gravity, Urine 1.003 (L) 1.005 - 1.030   pH 6.0 5.0 - 8.0   Glucose, UA NEGATIVE NEGATIVE mg/dL   Hgb urine dipstick NEGATIVE NEGATIVE   Bilirubin Urine NEGATIVE NEGATIVE   Ketones, ur NEGATIVE NEGATIVE mg/dL   Protein, ur NEGATIVE NEGATIVE mg/dL   Nitrite NEGATIVE NEGATIVE   Leukocytes,Ua NEGATIVE NEGATIVE  Comprehensive metabolic panel  Result Value Ref Range   Sodium 139 135 - 145 mmol/L   Potassium 3.7 3.5 - 5.1 mmol/L   Chloride 109 98 - 111 mmol/L   CO2 25 22 - 32 mmol/L   Glucose, Bld 95 70 - 99 mg/dL   BUN 10 8 - 23 mg/dL   Creatinine, Ser 0.81 0.61 - 1.24 mg/dL   Calcium 8.7 (L) 8.9 - 10.3 mg/dL   Total Protein 6.0 (L) 6.5 - 8.1 g/dL   Albumin 3.2 (L) 3.5 - 5.0 g/dL   AST 58 (H) 15 - 41 U/L   ALT 46 (H) 0 - 44 U/L   Alkaline Phosphatase 53 38 - 126 U/L   Total Bilirubin 0.6 0.3 - 1.2 mg/dL   GFR, Estimated >60 >60 mL/min   Anion gap 5 5 - 15  Magnesium  Result Value Ref Range   Magnesium 1.8 1.7 - 2.4 mg/dL  CBC WITH DIFFERENTIAL  Result Value Ref Range   WBC 7.3 4.0 - 10.5 K/uL   RBC 3.94 (L) 4.22 - 5.81 MIL/uL   Hemoglobin 13.4 13.0 - 17.0 g/dL   HCT 40.8 39.0 - 52.0 %   MCV 103.6 (H) 80.0 - 100.0 fL   MCH  34.0 26.0 - 34.0 pg   MCHC 32.8 30.0 - 36.0 g/dL   RDW 12.8 11.5 - 15.5 %   Platelets 154 150 - 400 K/uL   nRBC 0.0 0.0 - 0.2 %  Neutrophils Relative % 52 %   Neutro Abs 3.9 1.7 - 7.7 K/uL   Lymphocytes Relative 32 %   Lymphs Abs 2.3 0.7 - 4.0 K/uL   Monocytes Relative 10 %   Monocytes Absolute 0.7 0.1 - 1.0 K/uL   Eosinophils Relative 4 %   Eosinophils Absolute 0.3 0.0 - 0.5 K/uL   Basophils Relative 1 %   Basophils Absolute 0.1 0.0 - 0.1 K/uL   Immature Granulocytes 1 %   Abs Immature Granulocytes 0.04 0.00 - 0.07 K/uL   Time spent 32 mins  Murvin Natal, MD [facility]   09/23/2021  7:12 PM How to contact the [facility] Attending or Consulting provider [place] or covering provider during after hours [place], for this patient?  Check the care team in [facility] and look for a) attending/consulting [facility] provider listed and b) the [facility] team listed Log into [email or web address] and use [place]'s universal password to access. If you do not have the password, please contact the hospital operator. Locate the [facility] provider you are looking for under [facility] and page to a number that you can be directly reached. If you still have difficulty reaching the provider, please page the [facility] (Director on Call) for the Hospitalists listed on amion for assistance.

## 2021-09-24 NOTE — Consult Note (Addendum)
Gastroenterology Consult   Referring Provider: No ref. provider found Primary Care Physician:  [name], MD Primary Gastroenterologist:  Dr. Gala Romney  Patient ID: [name]; [id number]; [birth year]   Admit date: 09/23/2021  LOS: 1 day   Date of Consultation: 09/24/2021  Reason for Consultation:  Diverticulitis   History of Present Illness   [name] is a 66 y.o. 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  He presented to the ED at the advice of his PCP to have a CT scan of his abdomen due to failed trial of Cipro and Flagyl outpatient. He has had mostly left lower abdominal pain for 2-3 weeks but worsened over the last week with increased burning which prompted this visit at the recommendation of his PCP.   He denies hematemesis, dysphagia, melena, hematochezia, pain with defecation. He denies recent issues swallowing. He does report some constipation and change in BM immediately prior to pain developing. He states that has he reflects on his last flair that he also had changes then. He reports that he has been following a higher fiber diet as Dr. Gala Romney suggested. He reports he did back off from the metamucil and fiber when he started having pain to see if that could help and avoid having issues like he had previously. He reports that when the pain started he noticed it more when he was having difficulty walking or lifting his leg to get in the car and hurts wih hip flexion. He does report that after he initially moves and begins to walk the pain is still there but is improved and is usually able to tolerate a little walking. He reports he is very active and walks 13-15K steps a day and this has been affecting his ability to play golf. He would like to walk around the unit some today. He repots  surgery was mentioned to him when he had the complicated case of diverticulitis but he opted for 6 weeks antibiotics instead.   CT abd/pelvis with contrast showed acute sigmoid diverticulitis with associated wall thickening and pericolonic edema without perforation or abscess. This is at the same  Present are other multiple non-inflamed diverticula and moderate stool burden. There is also evidence of advanced hepatic steatosis and gallbladder distention without stones or dilation.   Labs reveal no evidence of leukocytosis. WBC 8, afebrile.   Per Epic review his most recent admission (January 2022) with diverticulitis was complicated with abscess and microperforation and had a drain placed at that time and was discharged home with a PICC line and 6 weeks of antibiotics. He then was found to have recurrence in April 2022 with a small fluid collection. He had his last colonoscopy in  July 2022 with many polyps without adenomatous changes but all inflammatory in nature with mildly active colitis and chronic inflammatory mucosa.   He reported that in January just before this pain began he was having difficulty with SOB and was found to have multiple DVTs and PEs for which he is currently on Eliquis.  Past Medical History:  Diagnosis Date   Bilateral shoulder bursitis    Chronic back pain    COPD (chronic obstructive pulmonary disease) with emphysema ([name])    DDD (degenerative disc disease), lumbar    hx epideral injection  L5--S1    Diverticulitis  DVT (deep venous thrombosis) ([place]) 11/19/2018   Dysphasia    intermittant w/ food    Dyspnea    Dyspnea on exertion    GERD (gastroesophageal reflux disease)    Gout    L great toe   Heterozygous factor V Leiden mutation ([place])    Hiatal hernia    Hip problem 2020   left, seeing orthopedics   History of acute pancreatitis    alcoholic pancreatitis [id number] and 01-30-2012   History of colon polyps    10-17-2005  hyperplastic polyp's    History of esophageal stricture    s/p  dilation 02-02-2017   History of peptic ulcer 1990s   History of pneumococcal pneumonia 2006   bilateral pneumonia w/ left lower lobe abscess treated w/ chest tube with suction for drainage   History of pneumothorax    1984--  left spontaneous pneumothorax ,  treated w/ chest tube   Malignant neoplasm of prostate [facility]) urologist-  dr dahlstedt/  oncologist -- dr Tammi Klippel   dx 12-02-2016-- Stage T2b, Gleason 3+4,  PSA 5.99,  vol 25cc   Numbness of left foot    outer left ankle numb-- per pt has appt. w/ neurologist   OSA (obstructive sleep apnea)    per pt has not used cpap over year ago from 04-16-2017--- per study 01-17-2010  severe osa   Solid nodule of lung greater than 8 mm in diameter 05/11/2018   03/2018: RUL Nodule, 24mm   Wears glasses     Past Surgical History:  Procedure Laterality Date   BIOPSY  02/02/2017   Procedure: BIOPSY;  Surgeon: Daneil Dolin, MD;  [facility];  Service: Endoscopy;;  duodenal bx's, esophgeal bx's   BIOPSY  07/30/2017   Procedure: BIOPSY;  Surgeon: Daneil Dolin, MD;  [facility];  Service: Endoscopy;;  esophagus   BIOPSY  02/15/2021   Procedure: BIOPSY;  Surgeon: Daneil Dolin, MD;  [facility];  Service: Endoscopy;;   CARDIOVASCULAR STRESS TEST  09/12/2009   normal nuclear perfusion study w/ no ischemia /  normal LV function and wall motion , ef 57%   COLONOSCOPY  2007   Dr. Gala Romney: hyperplastic polyps, internal hemorrhoids    COLONOSCOPY WITH PROPOFOL N/A 05/03/2018   Dr. Gala Romney: diverticulosis in entire colon, multiple polyps in sigmoid, at splenic flexure, and ascending colon, not all polyps removed. Tubular adenomas and inflammatory polyps. [name] 3 year surveilance   COLONOSCOPY WITH PROPOFOL N/A 02/15/2021   Many polyps without adenomatous changes but all inflammatory in nature with mildly active colitis and chronic inflammatory mucosa.   EPIDURAL BLOCK INJECTION   2020   ESOPHAGOGASTRODUODENOSCOPY (EGD) WITH PROPOFOL N/A 02/02/2017   Dr. Gala Romney: esophageal stenosis s/p dilation and biopsy, medium sized hiatal hernia, normal duodenum   ESOPHAGOGASTRODUODENOSCOPY (EGD) WITH PROPOFOL N/A 07/30/2017   Dr. Gala Romney: esophageal stenosis s/p dilation and biopsy, medium-sized hiatal hernia, normal duodenum   HEMORRHOID SURGERY  01-21-2008    [facility]   INTRAVASCULAR PRESSURE WIRE/FFR STUDY N/A 08/01/2021   Procedure: INTRAVASCULAR PRESSURE WIRE/FFR STUDY;  Surgeon: Burnell Blanks, MD;  Location: [place] [facility];  Service: Cardiovascular;  Laterality: N/A;   IR RADIOLOGIST EVAL & MGMT  08/29/2020   LEFT HEART CATH AND CORONARY ANGIOGRAPHY N/A 08/01/2021   Procedure: LEFT HEART CATH AND CORONARY ANGIOGRAPHY;  Surgeon: Burnell Blanks, MD;  Location: [place] [facility];  Service: Cardiovascular;  Laterality: N/A;   MALONEY DILATION N/A 02/02/2017  Procedure: MALONEY DILATION;  Surgeon: Daneil Dolin, MD;  [facility];  Service: Endoscopy;  Laterality: N/A;   MALONEY DILATION N/A 07/30/2017   Procedure: Venia Minks DILATION;  Surgeon: Daneil Dolin, MD;  [facility];  Service: Endoscopy;  Laterality: N/A;   POLYPECTOMY  05/03/2018   Procedure: POLYPECTOMY;  Surgeon: Daneil Dolin, MD;  [facility];  Service: Endoscopy;;  ascending colon polyp, sigmoid colon polyps (multiple)   POLYPECTOMY  02/15/2021   Procedure: POLYPECTOMY;  Surgeon: Daneil Dolin, MD;  [facility];  Service: Endoscopy;;   RADIOACTIVE SEED IMPLANT N/A 04/23/2017   Procedure: RADIOACTIVE SEED IMPLANT/BRACHYTHERAPY IMPLANT;  Surgeon: Franchot Gallo, MD;  Location: [facility];  Service: Urology;  Laterality: N/A;   SHOULDER ARTHROSCOPY  08/05/2003   SPACE OAR INSTILLATION N/A 04/23/2017   Procedure: SPACE OAR INSTILLATION;  Surgeon: Franchot Gallo, MD;  Location: [facility];  Service:  Urology;  Laterality: N/A;   TRANSTHORACIC ECHOCARDIOGRAM  03/28/2009   mild LVH, ef 55-60%/ mild aorta calcification   VASECTOMY  08/05/1983   VIDEO ASSISTED THORACOSCOPY (VATS)/DECORTICATION Left     Prior to Admission medications   Medication Sig Start Date End Date Taking? Authorizing Provider  acetaminophen (TYLENOL) 500 MG tablet Take 2 tablets (1,000 mg total) by mouth every 6 (six) hours as needed for mild pain. 08/21/20  Yes Meuth, Brooke A, PA-C  albuterol (PROVENTIL) (2.5 MG/3ML) 0.083% nebulizer solution Take 2.5 mg by nebulization every 6 (six) hours as needed for wheezing or shortness of breath.   Yes [provider]  albuterol (VENTOLIN HFA) 108 (90 Base) MCG/ACT inhaler Inhale 2 puffs into the lungs every 6 (six) hours as needed for wheezing or shortness of breath.   Yes [provider]  allopurinol (ZYLOPRIM) 300 MG tablet Take 300 mg by mouth daily. 09/17/20  Yes [provider]  apixaban (ELIQUIS) 5 MG TABS tablet Take 1 tablet (5 mg total) by mouth 2 (two) times daily. 08/25/21 09/24/21 Yes Shah, Pratik D, DO  Artificial Tear Solution (SOOTHE XP OP) Place 1 drop into both eyes daily as needed (Dry eyes).   Yes [provider]  ciprofloxacin (CIPRO) 500 MG tablet Take 500 mg by mouth 2 (two) times daily. 09/13/21  Yes [provider]  doxycycline (VIBRAMYCIN) 50 MG capsule Take 50 mg by mouth daily. 08/21/21  Yes [provider]  Fluticasone-Umeclidin-Vilant (TRELEGY ELLIPTA) 100-62.5-25 MCG/ACT AEPB Inhale 1 puff into the lungs daily. 07/11/21  Yes Icard, Bradley L, DO  gabapentin (NEURONTIN) 300 MG capsule Take 300 mg by mouth daily. 09/10/21  Yes [provider]  metoprolol succinate (TOPROL XL) 25 MG 24 hr tablet Take 1 tablet (25 mg total) by mouth daily. 07/26/21  Yes Richardo Priest, MD  psyllium (METAMUCIL SMOOTH TEXTURE) 28 % packet Take 1 packet by mouth daily.   Yes [provider]  rosuvastatin  (CRESTOR) 20 MG tablet Take 1 tablet (20 mg total) by mouth daily. 07/26/21 10/24/21 Yes Richardo Priest, MD  apixaban (ELIQUIS) 5 MG TABS tablet Take 2 tablets (10 mg total) by mouth 2 (two) times daily for 7 days. Patient not taking: Reported on 09/23/2021 08/18/21 09/23/21  Heath Lark D, DO    Current Facility-Administered Medications  Medication Dose Route Frequency Provider Last Rate Last Admin   0.9 %  sodium chloride infusion   Intravenous Continuous Zierle-Ghosh, Asia B, DO 75 mL/hr at 09/24/21 0123 New Bag at  09/24/21 0123   acetaminophen (TYLENOL) tablet 650 mg  650 mg Oral Q6H PRN Zierle-Ghosh, Asia B, DO   650 mg at 09/24/21 0700   Or   acetaminophen (TYLENOL) suppository 650 mg  650 mg Rectal Q6H PRN Zierle-Ghosh, Asia B, DO       albuterol (PROVENTIL) (2.5 MG/3ML) 0.083% nebulizer solution 2.5 mg  2.5 mg Nebulization Q6H PRN Zierle-Ghosh, Asia B, DO       apixaban (ELIQUIS) tablet 5 mg  5 mg Oral BID Zierle-Ghosh, Asia B, DO   5 mg at 09/24/21 0856   fluticasone furoate-vilanterol (BREO ELLIPTA) 100-25 MCG/ACT 1 puff  1 puff Inhalation Daily Zierle-Ghosh, Asia B, DO   1 puff at 09/24/21 8182   And   umeclidinium bromide (INCRUSE ELLIPTA) 62.5 MCG/ACT 1 puff  1 puff Inhalation Daily Zierle-Ghosh, Asia B, DO   1 puff at 09/24/21 9937   gabapentin (NEURONTIN) capsule 300 mg  300 mg Oral Daily Zierle-Ghosh, Asia B, DO   300 mg at 09/24/21 0855   metoprolol succinate (TOPROL-XL) 24 hr tablet 25 mg  25 mg Oral Daily Zierle-Ghosh, Asia B, DO   25 mg at 09/24/21 0856   morphine (PF) 2 MG/ML injection 2 mg  2 mg Intravenous Q2H PRN Zierle-Ghosh, Asia B, DO   2 mg at 09/24/21 0509   ondansetron (ZOFRAN) tablet 4 mg  4 mg Oral Q6H PRN Zierle-Ghosh, Asia B, DO       Or   ondansetron (ZOFRAN) injection 4 mg  4 mg Intravenous Q6H PRN Zierle-Ghosh, Asia B, DO   4 mg at 09/24/21 0701   oxyCODONE (Oxy IR/ROXICODONE) immediate release tablet 5 mg  5 mg Oral Q4H PRN Zierle-Ghosh, Asia B, DO        piperacillin-tazobactam (ZOSYN) IVPB 3.375 g  3.375 g Intravenous Q8H Zierle-Ghosh, Asia B, DO 12.5 mL/hr at 09/24/21 0442 3.375 g at 09/24/21 0442   polyethylene glycol (MIRALAX / GLYCOLAX) packet 17 g  17 g Oral Daily Venetia Night L, NP       rosuvastatin (CRESTOR) tablet 20 mg  20 mg Oral Daily Zierle-Ghosh, Asia B, DO   20 mg at 09/24/21 0856    Allergies as of 09/23/2021   (No Known Allergies)    Family History  Problem Relation Age of Onset   Diabetes Mother    Heart disease Father    Heart disease Brother    Cancer Neg Hx    Colon cancer Neg Hx    Colon polyps Neg Hx    Neuropathy Neg Hx     Social History   Socioeconomic History   Marital status: Married    Spouse name: Not on file   Number of children: 2   Years of education: Not on file   Highest education level: Bachelor's degree (e.g., BA, AB, BS)  Occupational History   Not on file  Tobacco Use   Smoking status: Former    Packs/day: 1.00    Years: 30.00    Pack years: 30.00    Types: Cigarettes, E-cigarettes    Quit date: 10/03/2015    Years since quitting: 5.9   Smokeless tobacco: Never   Tobacco comments:    stoped vaping 2018  Vaping Use   Vaping Use: Former  Substance and Sexual Activity   Alcohol use: Yes    Alcohol/week: 5.0 standard drinks    Types: 5 Glasses of wine per week   Drug use: No   Sexual activity: Yes  Birth control/protection: None  Other Topics Concern   Not on file  Social History Narrative   Lives at home with his wife   Right handed   Social Determinants of Health   Financial Resource Strain: Not on file  Food Insecurity: Not on file  Transportation [name]: Not on file  Physical Activity: Not on file  Stress: Not on file  Social Connections: Not on file  Intimate Partner Violence: Not on file     Review of Systems   Gen: Denies any fever, chills, loss of appetite, change in weight or weight loss CV: Denies chest pain, heart palpitations, syncope, edema   Resp: Denies shortness of breath with rest, cough, wheezing, coughing up blood, and pleurisy. GI: see HPI GU : Denies urinary burning, blood in urine, urinary frequency, and urinary incontinence. MS: Denies joint pain, limitation of movement, swelling, cramps, and atrophy.  Derm: Denies rash, itching, dry skin, hives. Psych: Denies depression, anxiety, memory loss, hallucinations, and confusion. Heme: Denies bruising or bleeding Neuro:  Denies any headaches, dizziness, paresthesias, shaking  Physical Exam   Vital Signs in last 24 hours: Temp:  [97.7 F (36.5 C)-98.6 F (37 C)] 97.7 F (36.5 C) (02/21 0843) Pulse Rate:  [58-75] 61 (02/21 0843) Resp:  [12-20] 18 (02/21 0843) BP: (116-143)/(71-88) 125/81 (02/21 0843) SpO2:  [91 %-100 %] 97 % (02/21 0843) Weight:  [106.8 kg] 106.8 kg (02/20 1909) Last BM Date : 09/23/21  General:   Alert,  Well-developed, well-nourished, pleasant and cooperative in NAD Head:  Normocephalic and atraumatic. Eyes:  Sclera clear, no icterus.  Ears:  Normal auditory acuity. Lungs:  Clear throughout to auscultation.   No wheezes, crackles, or rhonchi. No acute distress. Heart:  Regular rate and rhythm; no murmurs, clicks, rubs,  or gallops. Abdomen:  Soft, TTP in the LLQ, left inguinal, and RUQ. Mild distention. No masses. Mild hepatomegaly. Normal bowel sounds, without guarding, and without rebound tenderness.  Rectal: deferred Msk:  Symmetrical without gross deformities. Normal posture. Extremities:  Without clubbing or edema. Neurologic:  Alert and oriented x4. Skin:  Intact without significant lesions or rashes. Psych:  Alert and cooperative. Normal mood and affect.  Intake/Output from previous day: 02/20 0701 - 02/21 0700 In: 94.1 [IV Piggyback:94.1] Out: -  Intake/Output this shift: No intake/output data recorded.   Labs/Studies   Recent Labs Recent Labs    09/23/21 1923 09/24/21 0456  WBC 8.0 7.3  HGB 15.0 13.4  HCT 43.4 40.8   PLT 171 154   BMET Recent Labs    09/23/21 1923 09/24/21 0456  NA 136 139  K 3.4* 3.7  CL 106 109  CO2 21* 25  GLUCOSE 165* 95  BUN 12 10  CREATININE 0.87 0.81  CALCIUM 9.2 8.7*   LFT Recent Labs    09/23/21 1923 09/24/21 0456  PROT 7.1 6.0*  ALBUMIN 3.8 3.2*  AST 71* 58*  ALT 56* 46*  ALKPHOS 62 53  BILITOT 0.6 0.6   PT/INR No results for input(s): LABPROT, INR in the last 72 hours. Hepatitis Panel No results for input(s): HEPBSAG, HCVAB, HEPAIGM, HEPBIGM in the last 72 hours. C-Diff No results for input(s): CDIFFTOX in the last 72 hours.  Radiology/Studies CT ABDOMEN PELVIS W CONTRAST  Result Date: 09/23/2021 CLINICAL DATA:  Acute abdominal pain.  Pain for 2 weeks. EXAM: CT ABDOMEN AND PELVIS WITH CONTRAST TECHNIQUE: Multidetector CT imaging of the abdomen and pelvis was performed using the standard protocol following bolus administration of intravenous contrast.  RADIATION DOSE REDUCTION: This exam was performed according to the departmental dose-optimization program which includes automated exposure control, adjustment of the mA and/or kV according to patient size and/or use of iterative reconstruction technique. CONTRAST:  186mL OMNIPAQUE IOHEXOL 300 MG/ML  SOLN COMPARISON:  CT 11/14/2020 FINDINGS: Lower chest: Hypoventilatory changes. No acute airspace disease. No pleural effusion. Hepatobiliary: Advanced hepatic steatosis with more focal areas of fatty infiltration and fatty sparing. Enlarged liver spanning 22.3 cm. Gallbladder physiologically distended, no calcified stone. No biliary dilatation. Pancreas: No ductal dilatation or inflammation. Spleen: Normal in size without focal abnormality. Adrenals/Urinary Tract: Normal adrenal glands. No hydronephrosis or perinephric edema. Homogeneous renal enhancement with symmetric excretion on delayed phase imaging. No renal calculi. Unchanged appearance of bilateral renal cysts. Urinary bladder is physiologically distended  without wall thickening. Stomach/Bowel: Acute sigmoid diverticulitis with associated colonic wall thickening and pericolonic edema, series 2, image 70. This is at site of prior diverticulitis. No perforation or abscess. Innumerable additional noninflamed colonic diverticula. Sigmoid colon is redundant. Moderate colonic stool burden. Normal appendix. No small bowel obstruction or inflammation. Small umbilical hernia contains nonobstructed noninflamed small bowel there is a small hiatal hernia. The stomach is decompressed. Vascular/Lymphatic: Aortic atherosclerosis. No aortic aneurysm. Patent portal, splenic, and mesenteric veins. There are small mesenteric nodes measuring up to 7 mm in the sigmoid mesentery. 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 Other: No free air, free fluid, or intra-abdominal fluid collection. Fat containing bilateral inguinal hernias. Umbilical hernia contains nonobstructed noninflamed small bowel. Musculoskeletal: Degenerative disc disease at L4-L5 and L5-S1. Bilateral hip arthroplasties. There are no acute or suspicious osseous abnormalities. IMPRESSION: 1. Acute sigmoid diverticulitis without perforation or abscess. 2. Advanced hepatic steatosis and hepatomegaly Aortic Atherosclerosis (ICD10-I70.0). Electronically Signed   By: Keith Rake M.D.   On: 09/23/2021 22:41     Assessment   [name] is a 66 y.o. 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 GI consulted to diverticulitis.  Diverticulitis: Imaging evidence of acute sigmoid diverticulitis with edema and wall thickening without microperforation or abscess and multiple other diverticula and stool burden. This is in the same spot as his last diverticulitis flair. He has had an outpatient course of cipro and flagyl with no improvement and  worsening in abdominal pain. He should received supportive care with pain medication, IV fluids and IV antibiotics at this time. With history of complicated diverticulitis, chronic inflammatory mucosal changes, and diverticula noted throughout the colon surgery evaluation outpatient could be considered but may would lead to total colectomy. Educated the patient that if pain worsens or if pain does not subside or no longer tolerable with walking   Hepatic Steatosis: CT evidence of advanced hepatic steatosis, liver span 22.3 cm, and some gallbladder distention. AST and ALT normal in April 2022. AST and ALT elevated in prior hospitalization in January 23 (AST 55, ALT 46). On admission to the hospital they were elevated to AST 71, ALT 56 and were improved slightly this morning. He s endorsing some RUQ tenderness to palpation and there is some slight hepatomegaly. Will continue to monitor LFTs. Should have follow up outpatient.    Plan / Recommendations   Clear liquids diet okay Continue supportive care with IV fluids and pain control. Continue Zosyn 3.375 g IV q8h.  Miralax 17g daily for stool burden  Monitor LFTs and follow up  outpatient.    09/24/2021, 12:03 PM  Venetia Night, MSN, FNP-BC, AGACNP-BC [facility]

## 2021-09-24 NOTE — Progress Notes (Signed)
CPAP setup for hs in auto titrate min 5 max 20 with medium mask, plugged in red outlet. Pt not ready to put on yet RT will come back to check on patient

## 2021-09-24 NOTE — ED Notes (Signed)
Report called. Patient is ready for transport.

## 2021-09-25 DIAGNOSIS — K760 Fatty (change of) liver, not elsewhere classified: Secondary | ICD-10-CM

## 2021-09-25 MED ORDER — MELATONIN 3 MG PO TABS: 6.0000 mg | ORAL_TABLET | Freq: Once | ORAL | Status: DC

## 2021-09-25 NOTE — Progress Notes (Signed)
PROGRESS NOTE    [name]  OMV:[id number] DOB: 1956/06/26 DOA: 09/23/2021 PCP: [name], MD   Brief Narrative:   66 y.o. male with medical history significant of with history of COPD, diverticulitis, DVT, heterozygous factor V Leiden mutation, GERD, and more presents the ED with a chief complaint of abdominal pain patient reports he has had left lower quadrant and groin pain for 2-3 weeks.  He was the same for 2 weeks then became worse over the last week.  Patient denies any diarrhea, hematochezia, melena, nausea, vomiting.  He reports he has normal appetite.  He has been seen in the outpatient setting and was prescribed Cipro and Flagyl.  It has not relieved his pain.  He is actually gotten worse while he has been on it.  He has taken Tylenol at home for the pain and offers no relief.  Patient describes his pain as a burning pain in the left lower quadrant.  It radiates down to his groin.  It hurts to activate his hip flexors on the left side.  Patient initially thought something was wrong with his hip and had appointment with Ortho for hip x-rays but nothing was revealed.  Patient reports that his last hospitalization there was discussion of surgery with diarrhea and ostomy.  Patient did not want to have that done and opted for 6 weeks of antibiotics instead.  Patient has no other complaints at this time.     09/24/2021:  GI consulted for their recommendations regarding consideration of surgical referral given severity and recurrent nature of his diverticulitis. Continue supportive care with IV Zosyn, fluids, pain management.    09/25/21: Patient states that his pain levels have lessened and that he would like to have his diet advanced.  Reports no bowel movements.  Assessment & Plan:   Principal Problem:   Diverticulitis Active Problems:   COPD (chronic obstructive pulmonary disease) ([name])   Obstructive sleep apnea treated with continuous positive airway pressure (CPAP)    Hypokalemia   History of DVT (deep vein thrombosis)   Mixed hyperlipidemia   Hepatic steatosis   Diverticulitis- (present on admission) Patient failed outpatient antibiotic regimen of Cipro and Flagyl I discussed with pharmacy and we determined that Zosyn would be the next best step Start Zosyn IV and other supportive measures.  Continue to monitor CBC for leukocytosis, CMP for any electrolyte abnormalities GI consult requested for their recommendation about need for surgical referral for definitive management of this problem that has been difficult to treat medically.    History of DVT (deep vein thrombosis) With history of DVTs and pulmonary emboli and heterozygous factor V Leiden mutation Continue Eliquis (apixaban) for full anticoagulation    Hypokalemia- (present on admission) Potassium 3.4, 20 mEq IV potassium given in the ED and REPLETED Secondary to poor oral intake as patient has been trying trying to avoid fruits and vegetables since he has been in a diverticulitis flare Follow K and Mg closely for next couple of days.    Mixed hyperlipidemia- (present on admission) Continue Crestor   Obstructive sleep apnea treated with continuous positive airway pressure (CPAP) Will offer nightly cpap while in hospital    COPD (chronic obstructive pulmonary disease) ([place])- (present on admission) Resume home bronchodilators    DVT prophylaxis: Apixaban Code Status: Full Family Communication: Wife at bedside 2/22 Disposition Plan:  Status is: Inpatient Remains inpatient appropriate because: Continues to require IV antibiotics.   Consultants:  GI  Procedures:  None  Antimicrobials:  Anti-infectives (  From admission, onward)    Start     Dose/Rate Route Frequency Ordered Stop   09/23/21 2345  piperacillin-tazobactam (ZOSYN) IVPB 3.375 g        3.375 g 12.5 mL/hr over 240 Minutes Intravenous Every 8 hours 09/23/21 2335         Subjective: Patient seen and evaluated  today with improvements in abdominal pain and would like to have his diet advanced.  He denies any bowel movements as of yet.  Objective: Vitals:   09/24/21 2128 09/25/21 0527 09/25/21 0717 09/25/21 0846  BP: 97/75 111/71  123/81  Pulse: 63 66  69  Resp: 20 16    Temp: 97.7 F (36.5 C) 97.9 F (36.6 C)    TempSrc: Oral     SpO2: 94% 95% 96%   Weight:      Height:        Intake/Output Summary (Last 24 hours) at 09/25/2021 1253 Last data filed at 09/25/2021 1100 Gross per 24 hour  Intake 1985.16 ml  Output 1050 ml  Net 935.16 ml   Filed Weights   09/23/21 1909  Weight: 106.8 kg    Examination:  General exam: Appears calm and comfortable  Respiratory system: Clear to auscultation. Respiratory effort normal. Cardiovascular system: S1 & S2 heard, RRR.  Gastrointestinal system: Abdomen is soft Central nervous system: Alert and awake Extremities: No edema Skin: No significant lesions noted Psychiatry: Flat affect.    Data Reviewed: I have personally reviewed following labs and imaging studies  CBC: Recent Labs  Lab 09/23/21 1923 09/24/21 0456  WBC 8.0 7.3  NEUTROABS  --  3.9  HGB 15.0 13.4  HCT 43.4 40.8  MCV 103.6* 103.6*  PLT 171 552   Basic Metabolic Panel: Recent Labs  Lab 09/23/21 1923 09/24/21 0456  NA 136 139  K 3.4* 3.7  CL 106 109  CO2 21* 25  GLUCOSE 165* 95  BUN 12 10  CREATININE 0.87 0.81  CALCIUM 9.2 8.7*  MG  --  1.8   GFR: Estimated Creatinine Clearance: 118.2 mL/min (by C-G formula based on SCr of 0.81 mg/dL). Liver Function Tests: Recent Labs  Lab 09/23/21 1923 09/24/21 0456  AST 71* 58*  ALT 56* 46*  ALKPHOS 62 53  BILITOT 0.6 0.6  PROT 7.1 6.0*  ALBUMIN 3.8 3.2*   Recent Labs  Lab 09/23/21 1923  LIPASE 36   No results for input(s): AMMONIA in the last 168 hours. Coagulation Profile: No results for input(s): INR, PROTIME in the last 168 hours. Cardiac Enzymes: No results for input(s): CKTOTAL, CKMB, CKMBINDEX,  TROPONINI in the last 168 hours. BNP (last 3 results) No results for input(s): PROBNP in the last 8760 hours. HbA1C: No results for input(s): HGBA1C in the last 72 hours. CBG: No results for input(s): GLUCAP in the last 168 hours. Lipid Profile: No results for input(s): CHOL, HDL, LDLCALC, TRIG, CHOLHDL, LDLDIRECT in the last 72 hours. Thyroid Function Tests: No results for input(s): TSH, T4TOTAL, FREET4, T3FREE, THYROIDAB in the last 72 hours. Anemia Panel: No results for input(s): VITAMINB12, FOLATE, FERRITIN, TIBC, IRON, RETICCTPCT in the last 72 hours. Sepsis Labs: No results for input(s): PROCALCITON, LATICACIDVEN in the last 168 hours.  Recent Results (from the past 240 hour(s))  Resp Panel by RT-PCR (Flu A&B, Covid) Nasopharyngeal Swab     Status: None   Collection Time: 09/23/21 11:29 PM   Specimen: Nasopharyngeal Swab; Nasopharyngeal(NP) swabs in vial transport medium  Result Value Ref Range Status  SARS Coronavirus 2 by RT PCR NEGATIVE NEGATIVE Final    Comment: (NOTE) SARS-CoV-2 target nucleic acids are NOT DETECTED.  The SARS-CoV-2 RNA is generally detectable in upper respiratory specimens during the acute phase of infection. The lowest concentration of SARS-CoV-2 viral copies this assay can detect is 138 copies/mL. A negative result does not preclude SARS-Cov-2 infection and should not be used as the sole basis for treatment or other patient management decisions. A negative result may occur with  improper specimen collection/handling, submission of specimen other than nasopharyngeal swab, presence of viral mutation(s) within the areas targeted by this assay, and inadequate number of viral copies(<138 copies/mL). A negative result must be combined with clinical observations, patient history, and epidemiological information. The expected result is Negative.  Fact Sheet for Patients:  EntrepreneurPulse.com.au  Fact Sheet for Healthcare Providers:   IncredibleEmployment.be  This test is no t yet approved or cleared by the [place] FDA and  has been authorized for detection and/or diagnosis of SARS-CoV-2 by FDA under an Emergency Use Authorization (EUA). This EUA will remain  in effect (meaning this test can be used) for the duration of the COVID-19 declaration under Section 564(b)(1) of the Act, 21 U.S.C.section 360bbb-3(b)(1), unless the authorization is terminated  or revoked sooner.       Influenza A by PCR NEGATIVE NEGATIVE Final   Influenza B by PCR NEGATIVE NEGATIVE Final    Comment: (NOTE) The Xpert Xpress SARS-CoV-2/FLU/RSV plus assay is intended as an aid in the diagnosis of influenza from Nasopharyngeal swab specimens and should not be used as a sole basis for treatment. Nasal washings and aspirates are unacceptable for Xpert Xpress SARS-CoV-2/FLU/RSV testing.  Fact Sheet for Patients: EntrepreneurPulse.com.au  Fact Sheet for Healthcare Providers: IncredibleEmployment.be  This test is not yet approved or cleared by the [place] FDA and has been authorized for detection and/or diagnosis of SARS-CoV-2 by FDA under an Emergency Use Authorization (EUA). This EUA will remain in effect (meaning this test can be used) for the duration of the COVID-19 declaration under Section 564(b)(1) of the Act, 21 U.S.C. section 360bbb-3(b)(1), unless the authorization is terminated or revoked.  Performed at [facility], [address], [place], [place] [address]          Radiology Studies: CT ABDOMEN PELVIS W CONTRAST  Result Date: 09/23/2021 CLINICAL DATA:  Acute abdominal pain.  Pain for 2 weeks. EXAM: CT ABDOMEN AND PELVIS WITH CONTRAST TECHNIQUE: Multidetector CT imaging of the abdomen and pelvis was performed using the standard protocol following bolus administration of intravenous contrast. RADIATION DOSE REDUCTION: This exam was performed according to the  departmental dose-optimization program which includes automated exposure control, adjustment of the mA and/or kV according to patient size and/or use of iterative reconstruction technique. CONTRAST:  146mL OMNIPAQUE IOHEXOL 300 MG/ML  SOLN COMPARISON:  CT 11/14/2020 FINDINGS: Lower chest: Hypoventilatory changes. No acute airspace disease. No pleural effusion. Hepatobiliary: Advanced hepatic steatosis with more focal areas of fatty infiltration and fatty sparing. Enlarged liver spanning 22.3 cm. Gallbladder physiologically distended, no calcified stone. No biliary dilatation. Pancreas: No ductal dilatation or inflammation. Spleen: Normal in size without focal abnormality. Adrenals/Urinary Tract: Normal adrenal glands. No hydronephrosis or perinephric edema. Homogeneous renal enhancement with symmetric excretion on delayed phase imaging. No renal calculi. Unchanged appearance of bilateral renal cysts. Urinary bladder is physiologically distended without wall thickening. Stomach/Bowel: Acute sigmoid diverticulitis with associated colonic wall thickening and pericolonic edema, series 2, image 70. This is at site of prior diverticulitis.  No perforation or abscess. Innumerable additional noninflamed colonic diverticula. Sigmoid colon is redundant. Moderate colonic stool burden. Normal appendix. No small bowel obstruction or inflammation. Small umbilical hernia contains nonobstructed noninflamed small bowel there is a small hiatal hernia. The stomach is decompressed. Vascular/Lymphatic: Aortic atherosclerosis. No aortic aneurysm. Patent portal, splenic, and mesenteric veins. There are small mesenteric nodes measuring up to 7 mm in the sigmoid mesentery. 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 Other: No free air, free fluid, or intra-abdominal fluid collection. Fat containing bilateral inguinal hernias. Umbilical hernia contains nonobstructed noninflamed small bowel. Musculoskeletal: Degenerative disc disease at  L4-L5 and L5-S1. Bilateral hip arthroplasties. There are no acute or suspicious osseous abnormalities. IMPRESSION: 1. Acute sigmoid diverticulitis without perforation or abscess. 2. Advanced hepatic steatosis and hepatomegaly Aortic Atherosclerosis (ICD10-I70.0). Electronically Signed   By: Keith Rake M.D.   On: 09/23/2021 22:41        Scheduled Meds:  apixaban  5 mg Oral BID   fluticasone furoate-vilanterol  1 puff Inhalation Daily   And   umeclidinium bromide  1 puff Inhalation Daily   gabapentin  300 mg Oral Daily   metoprolol succinate  25 mg Oral Daily   polyethylene glycol  17 g Oral Daily   rosuvastatin  20 mg Oral Daily   Continuous Infusions:  sodium chloride 75 mL/hr at 09/24/21 0123   piperacillin-tazobactam 3.375 g (09/25/21 0616)     LOS: 2 days    Time spent: 35 minutes    Sheniece Ruggles Darleen Crocker, DO [facility]  If 7PM-7AM, please contact night-coverage [email or web address] 09/25/2021, 12:53 PM

## 2021-09-25 NOTE — Progress Notes (Signed)
Patient alert and verbal. Patient ambulates self in the hallway, room and bathroom. Patient verbalized some complaints of pain this shift pain medication administered, see MAR. Patient reported having a bowel movement this shift.

## 2021-09-25 NOTE — Progress Notes (Signed)
Subjective: Patient reports that he continue to have LLQ/Left groin pain, he feels that pain may have improved some. He states that initially he was unable to pick his left leg up due to pain with hip flexion, he is now able to do this, though still experiences some pain. He has been able to get up and ambulate some. No BM this morning, last BM was early Monday morning. He tried to go today but was unable to, states he typically has a BM daily. Did have miralax and colace this morning, he drank some coffee and grape juice, as well as some chicken broth which he tolerated okay. He is drinking 2-3 pitchers of water per day.   Objective: Vital signs in last 24 hours: Temp:  [97.7 F (36.5 C)-98.1 F (36.7 C)] 97.9 F (36.6 C) (02/22 0527) Pulse Rate:  [59-69] 69 (02/22 0846) Resp:  [16-20] 16 (02/22 0527) BP: (97-123)/(65-81) 123/81 (02/22 0846) SpO2:  [92 %-96 %] 96 % (02/22 0717) Last BM Date : 09/23/21 General:   Alert and oriented, pleasant Head:  Normocephalic and atraumatic. Eyes:  No icterus, sclera clear. Conjuctiva pink.  Mouth:  Without lesions, mucosa pink and moist.  Heart:  S1, S2 present, no murmurs noted.  Lungs: Clear to auscultation bilaterally, without wheezing, rales, or rhonchi.  Abdomen:  Bowel sounds present, soft, mildly distended. TTP of LLQ. hepatomegaly. No rebound or guarding. No masses appreciated  Msk:  Symmetrical without gross deformities. Normal posture. Pulses:  Normal pulses noted. Extremities:  Without clubbing or edema. Neurologic:  Alert and  oriented x4;  grossly normal neurologically. Skin:  Warm and dry, intact without significant lesions.  Psych:  Alert and cooperative. Normal mood and affect.  Intake/Output from previous day: 02/21 0701 - 02/22 0700 In: 1505.2 [P.O.:1396; IV Piggyback:109.2] Out: 450 [Urine:450] Intake/Output this shift: No intake/output data recorded.  Lab Results: Recent Labs    09/23/21 1923 09/24/21 0456  WBC 8.0  7.3  HGB 15.0 13.4  HCT 43.4 40.8  PLT 171 154   BMET Recent Labs    09/23/21 1923 09/24/21 0456  NA 136 139  K 3.4* 3.7  CL 106 109  CO2 21* 25  GLUCOSE 165* 95  BUN 12 10  CREATININE 0.87 0.81  CALCIUM 9.2 8.7*   LFT Recent Labs    09/23/21 1923 09/24/21 0456  PROT 7.1 6.0*  ALBUMIN 3.8 3.2*  AST 71* 58*  ALT 56* 46*  ALKPHOS 62 53  BILITOT 0.6 0.6    Studies/Results: CT ABDOMEN PELVIS W CONTRAST  Result Date: 09/23/2021 CLINICAL DATA:  Acute abdominal pain.  Pain for 2 weeks. EXAM: CT ABDOMEN AND PELVIS WITH CONTRAST TECHNIQUE: Multidetector CT imaging of the abdomen and pelvis was performed using the standard protocol following bolus administration of intravenous contrast. RADIATION DOSE REDUCTION: This exam was performed according to the departmental dose-optimization program which includes automated exposure control, adjustment of the mA and/or kV according to patient size and/or use of iterative reconstruction technique. CONTRAST:  155mL OMNIPAQUE IOHEXOL 300 MG/ML  SOLN COMPARISON:  CT 11/14/2020 FINDINGS: Lower chest: Hypoventilatory changes. No acute airspace disease. No pleural effusion. Hepatobiliary: Advanced hepatic steatosis with more focal areas of fatty infiltration and fatty sparing. Enlarged liver spanning 22.3 cm. Gallbladder physiologically distended, no calcified stone. No biliary dilatation. Pancreas: No ductal dilatation or inflammation. Spleen: Normal in size without focal abnormality. Adrenals/Urinary Tract: Normal adrenal glands. No hydronephrosis or perinephric edema. Homogeneous renal enhancement with symmetric excretion on delayed phase imaging.  No renal calculi. Unchanged appearance of bilateral renal cysts. Urinary bladder is physiologically distended without wall thickening. Stomach/Bowel: Acute sigmoid diverticulitis with associated colonic wall thickening and pericolonic edema, series 2, image 70. This is at site of prior diverticulitis. No  perforation or abscess. Innumerable additional noninflamed colonic diverticula. Sigmoid colon is redundant. Moderate colonic stool burden. Normal appendix. No small bowel obstruction or inflammation. Small umbilical hernia contains nonobstructed noninflamed small bowel there is a small hiatal hernia. The stomach is decompressed. Vascular/Lymphatic: Aortic atherosclerosis. No aortic aneurysm. Patent portal, splenic, and mesenteric veins. There are small mesenteric nodes measuring up to 7 mm in the sigmoid mesentery. 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 Other: No free air, free fluid, or intra-abdominal fluid collection. Fat containing bilateral inguinal hernias. Umbilical hernia contains nonobstructed noninflamed small bowel. Musculoskeletal: Degenerative disc disease at L4-L5 and L5-S1. Bilateral hip arthroplasties. There are no acute or suspicious osseous abnormalities. IMPRESSION: 1. Acute sigmoid diverticulitis without perforation or abscess. 2. Advanced hepatic steatosis and hepatomegaly Aortic Atherosclerosis (ICD10-I70.0). Electronically Signed   By: Keith Rake M.D.   On: 09/23/2021 22:41    Assessment: Belenda Cruise L. Adrian is a 66 year old male with history of GERD, previous diverticulitis complicated by abscess that required drainage, COPD, dysphagia, DVT and PE in Jan 2023 secondary to Factor V Leiden, gout esophageal stricture s/p dilation in 2018, PUD, colon polyps and prostate cancer, GI consulted due to diverticulitis.  Diverticulitis:  as evidenced by imaging with acute sigmoid diverticulitis with edema and wall thickening w/o microperforation or abscess and multiple other diverticula and stool burden. Previously had outpatient course of Cipro and flagyl x10 days for presumed diverticulitis after he developed LLQ pain, w/o improvement. Hx of complicated diverticulitis in the January 2022 with abscess requiring drainage, positive for E coli on culture, requiring 6 week course of  abx via PICC line. Patient denied melena or hematochezia this round, only complaint is pain in LLQ/Left groin. Currently on zosyn 3.375g IV Q8H. Patient has concerns about indications that diverticulitis is improving. I discussed with him that WBC is normal and symptomatically he appears to be improving, which are both reassuring signs that course is trending in the right direction.   Hepatic Steatosis: advanced CT hepatic steatosis on CT imaging during admission, liver span 22.3cm with some gallbladder distentions.  Elevated initially this admission with AST 71 and ALT 56, continuing to trend down, AST 58 and ALT 46 this morning, other LFTs WNL. Reassuringly platelet count WNL, INR in January was 1.2.   patient reports that he tries to follow a mediterranean diet high in cruciferous veggies/fiber on outpatient basis, though he is aware that he will need to avoid high fiber diet until acute course of diverticulitis is healed, may resume thereafter.    Plan: Continue zosyn q8h Soft diet starting at lunch Consider second dose of miralax if no BM this afternoon Consider discharge once symptomatically more improved  Will need 2-3 week course of Augmentin upon discharge further outpatient monitoring of LFTs/hepatic steatosis   LOS: 2 days    09/25/2021, 9:00 AM   Sameul Tagle L. Alver Sorrow, MSN, APRN, AGNP-C Adult-Gerontology Nurse Practitioner [facility]

## 2021-09-25 NOTE — Plan of Care (Signed)

## 2021-09-26 LAB — HEPATITIS C ANTIBODY: HCV Ab: NONREACTIVE

## 2021-09-26 LAB — HEPATITIS B SURFACE ANTIGEN: Hepatitis B Surface Ag: NONREACTIVE

## 2021-09-26 MED ORDER — AMOXICILLIN-POT CLAVULANATE 875-125 MG PO TABS: 1.0000 | ORAL_TABLET | Freq: Two times a day (BID) | ORAL | 0 refills | Status: AC

## 2021-09-26 MED ORDER — POLYETHYLENE GLYCOL 3350 17 G PO PACK: 17.0000 g | PACK | Freq: Every day | ORAL | 0 refills | Status: DC | PRN

## 2021-09-26 MED ORDER — DOCUSATE SODIUM 100 MG PO CAPS: 200.0000 mg | ORAL_CAPSULE | Freq: Two times a day (BID) | ORAL | 0 refills | Status: DC | PRN

## 2021-09-26 NOTE — Plan of Care (Signed)

## 2021-09-26 NOTE — Discharge Summary (Signed)
Physician Discharge Summary   Patient: [name] MRN: [id number] DOB: 15-Jan-1956  Admit date:     09/23/2021  Discharge date: 09/26/21  Discharge Physician: Rodena Goldmann   PCP: [name], MD   Recommendations at discharge:   Follow-up with PCP in 1 week and follow-up with [facility] gastroenterology will be scheduled Follow-up with [facility] surgery Continue Augmentin for 14 days as prescribed Continue other home medications as prior  Discharge Diagnoses: Principal Problem:   Diverticulitis Active Problems:   COPD (chronic obstructive pulmonary disease) ([name])   Obstructive sleep apnea treated with continuous positive airway pressure (CPAP)   Hypokalemia   History of DVT (deep vein thrombosis)   Mixed hyperlipidemia   Hepatic steatosis  Resolved Problems:   * No resolved hospital problems. *   Hospital Course:  66 y.o. male with medical history significant of with history of COPD, diverticulitis, DVT, heterozygous factor V Leiden mutation, GERD, and more presents the ED with a chief complaint of abdominal pain patient reports he has had left lower quadrant and groin pain for 2-3 weeks.  He was the same for 2 weeks then became worse over the last week.  Patient denies any diarrhea, hematochezia, melena, nausea, vomiting.  He reports he has normal appetite.  He has been seen in the outpatient setting and was prescribed Cipro and Flagyl.  It has not relieved his pain.  He is actually gotten worse while he has been on it.  He has taken Tylenol at home for the pain and offers no relief.  Patient describes his pain as a burning pain in the left lower quadrant.  It radiates down to his groin.  It hurts to activate his hip flexors on the left side.  Patient initially thought something was wrong with his hip and had appointment with Ortho for hip x-rays but nothing was revealed.  Patient reports that his last hospitalization there was discussion of surgery with diarrhea and  ostomy.  Patient did not want to have that done and opted for 6 weeks of antibiotics instead.  Patient has no other complaints at this time.    09/24/2021:  GI consulted for their recommendations regarding consideration of surgical referral given severity and recurrent nature of his diverticulitis. Continue supportive care with IV Zosyn, fluids, pain management.    2/22 - 2/23: Patient was continued on IV Zosyn and diet was advanced.  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  Hep B studies have returned negative.  Hep C studies are pending.  He is currently tolerating diet and having bowel movements and in stable condition to discharge and transition to oral Augmentin for the next 14 days and follow-up with GI and surgery in the outpatient setting.  No other acute events noted throughout the course of the stay.  Assessment and Plan: * Diverticulitis- (present on admission) Patient failed outpatient antibiotic regimen of Cipro and Flagyl Treated with IV Zosyn while inpatient Transition to oral Augmentin for the next 14 days and follow-up with GI outpatient  Mixed hyperlipidemia- (present on admission) Continue Crestor  History of DVT (deep vein thrombosis) With history of DVTs and pulmonary emboli and heterozygous factor V Leiden mutation Continue Eliquis (apixaban) for full anticoagulation   Hypokalemia- (present on admission) Resolved, follow-up outpatient   Obstructive sleep apnea treated with continuous positive airway pressure (CPAP) CPAP at  home.  COPD (chronic obstructive pulmonary disease) ([place])- (present on admission) Resume home bronchodilators and use as needed at home           Consultants: GI Procedures performed: None Disposition: Home Diet recommendation:  Discharge Diet Orders (From admission, onward)     Start     Ordered    09/26/21 0000  Diet - low sodium heart healthy        09/26/21 0910           Cardiac diet  DISCHARGE MEDICATION: Allergies as of 09/26/2021   No Known Allergies      Medication List     STOP taking these medications    ciprofloxacin 500 MG tablet Commonly known as: CIPRO   doxycycline 50 MG capsule Commonly known as: VIBRAMYCIN       TAKE these medications    acetaminophen 500 MG tablet Commonly known as: TYLENOL Take 2 tablets (1,000 mg total) by mouth every 6 (six) hours as needed for mild pain.   albuterol 108 (90 Base) MCG/ACT inhaler Commonly known as: VENTOLIN HFA Inhale 2 puffs into the lungs every 6 (six) hours as needed for wheezing or shortness of breath.   albuterol (2.5 MG/3ML) 0.083% nebulizer solution Commonly known as: PROVENTIL Take 2.5 mg by nebulization every 6 (six) hours as needed for wheezing or shortness of breath.   allopurinol 300 MG tablet Commonly known as: ZYLOPRIM Take 300 mg by mouth daily.   amoxicillin-clavulanate 875-125 MG tablet Commonly known as: Augmentin Take 1 tablet by mouth 2 (two) times daily for 14 days.   apixaban 5 MG Tabs tablet Commonly known as: ELIQUIS Take 1 tablet (5 mg total) by mouth 2 (two) times daily. What changed: Another medication with the same name was removed. Continue taking this medication, and follow the directions you see here.   docusate sodium 100 MG capsule Commonly known as: COLACE Take 2 capsules (200 mg total) by mouth 2 (two) times daily as needed for mild constipation (stool softener).   gabapentin 300 MG capsule Commonly known as: NEURONTIN Take 300 mg by mouth daily.   metoprolol succinate 25 MG 24 hr tablet Commonly known as: Toprol XL Take 1 tablet (25 mg total) by mouth daily.   polyethylene glycol 17 g packet Commonly known as: MIRALAX / GLYCOLAX Take 17 g by mouth daily as needed for moderate constipation or severe constipation.   psyllium 28 % packet Commonly known  as: METAMUCIL SMOOTH TEXTURE Take 1 packet by mouth daily.   rosuvastatin 20 MG tablet Commonly known as: CRESTOR Take 1 tablet (20 mg total) by mouth daily.   SOOTHE XP OP Place 1 drop into both eyes daily as needed (Dry eyes).   Trelegy Ellipta 100-62.5-25 MCG/ACT Aepb Generic drug: Fluticasone-Umeclidin-Vilant Inhale 1 puff into the lungs daily.        Follow-up Information     [name], MD. Schedule an appointment as soon as possible for a visit in 1 week(s).   Specialty: Family Medicine Contact information: [place] [address] [place]. Go to.   Contact information: [place] [phone]                Discharge Exam: Danley Danker Weights   09/23/21 1909  Weight: 106.8 kg     Condition at discharge: good  The results of significant diagnostics from this hospitalization (including imaging, microbiology, ancillary  and laboratory) are listed below for reference.   Imaging Studies: CT ABDOMEN PELVIS W CONTRAST  Result Date: 09/23/2021 CLINICAL DATA:  Acute abdominal pain.  Pain for 2 weeks. EXAM: CT ABDOMEN AND PELVIS WITH CONTRAST TECHNIQUE: Multidetector CT imaging of the abdomen and pelvis was performed using the standard protocol following bolus administration of intravenous contrast. RADIATION DOSE REDUCTION: This exam was performed according to the departmental dose-optimization program which includes automated exposure control, adjustment of the mA and/or kV according to patient size and/or use of iterative reconstruction technique. CONTRAST:  111mL OMNIPAQUE IOHEXOL 300 MG/ML  SOLN COMPARISON:  CT 11/14/2020 FINDINGS: Lower chest: Hypoventilatory changes. No acute airspace disease. No pleural effusion. Hepatobiliary: Advanced hepatic steatosis with more focal areas of fatty infiltration and fatty sparing. Enlarged liver spanning 22.3 cm.  Gallbladder physiologically distended, no calcified stone. No biliary dilatation. Pancreas: No ductal dilatation or inflammation. Spleen: Normal in size without focal abnormality. Adrenals/Urinary Tract: Normal adrenal glands. No hydronephrosis or perinephric edema. Homogeneous renal enhancement with symmetric excretion on delayed phase imaging. No renal calculi. Unchanged appearance of bilateral renal cysts. Urinary bladder is physiologically distended without wall thickening. Stomach/Bowel: Acute sigmoid diverticulitis with associated colonic wall thickening and pericolonic edema, series 2, image 70. This is at site of prior diverticulitis. No perforation or abscess. Innumerable additional noninflamed colonic diverticula. Sigmoid colon is redundant. Moderate colonic stool burden. Normal appendix. No small bowel obstruction or inflammation. Small umbilical hernia contains nonobstructed noninflamed small bowel there is a small hiatal hernia. The stomach is decompressed. Vascular/Lymphatic: Aortic atherosclerosis. No aortic aneurysm. Patent portal, splenic, and mesenteric veins. There are small mesenteric nodes measuring up to 7 mm in the sigmoid mesentery. 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 Other: No free air, free fluid, or intra-abdominal fluid collection. Fat containing bilateral inguinal hernias. Umbilical hernia contains nonobstructed noninflamed small bowel. Musculoskeletal: Degenerative disc disease at L4-L5 and L5-S1. Bilateral hip arthroplasties. There are no acute or suspicious osseous abnormalities. IMPRESSION: 1. Acute sigmoid diverticulitis without perforation or abscess. 2. Advanced hepatic steatosis and hepatomegaly Aortic Atherosclerosis (ICD10-I70.0). Electronically Signed   By: Keith Rake M.D.   On: 09/23/2021 22:41    Microbiology: Results for orders placed or performed during the hospital encounter of 09/23/21  Resp Panel by RT-PCR (Flu A&B, Covid) Nasopharyngeal Swab      Status: None   Collection Time: 09/23/21 11:29 PM   Specimen: Nasopharyngeal Swab; Nasopharyngeal(NP) swabs in vial transport medium  Result Value Ref Range Status   SARS Coronavirus 2 by RT PCR NEGATIVE NEGATIVE Final    Comment: (NOTE) SARS-CoV-2 target nucleic acids are NOT DETECTED.  The SARS-CoV-2 RNA is generally detectable in upper respiratory specimens during the acute phase of infection. The lowest concentration of SARS-CoV-2 viral copies this assay can detect is 138 copies/mL. A negative result does not preclude SARS-Cov-2 infection and should not be used as the sole basis for treatment or other patient management decisions. A negative result may occur with  improper specimen collection/handling, submission of specimen other than nasopharyngeal swab, presence of viral mutation(s) within the areas targeted by this assay, and inadequate number of viral copies(<138 copies/mL). A negative result must be combined with clinical observations, patient history, and epidemiological information. The expected result is Negative.  Fact Sheet for Patients:  EntrepreneurPulse.com.au  Fact Sheet for Healthcare Providers:  IncredibleEmployment.be  This test is no t yet approved or cleared by the [place] FDA and  has been authorized for detection and/or diagnosis of SARS-CoV-2 by FDA  under an Emergency Use Authorization (EUA). This EUA will remain  in effect (meaning this test can be used) for the duration of the COVID-19 declaration under Section 564(b)(1) of the Act, 21 U.S.C.section 360bbb-3(b)(1), unless the authorization is terminated  or revoked sooner.       Influenza A by PCR NEGATIVE NEGATIVE Final   Influenza B by PCR NEGATIVE NEGATIVE Final    Comment: (NOTE) The Xpert Xpress SARS-CoV-2/FLU/RSV plus assay is intended as an aid in the diagnosis of influenza from Nasopharyngeal swab specimens and should not be used as a sole basis  for treatment. Nasal washings and aspirates are unacceptable for Xpert Xpress SARS-CoV-2/FLU/RSV testing.  Fact Sheet for Patients: EntrepreneurPulse.com.au  Fact Sheet for Healthcare Providers: IncredibleEmployment.be  This test is not yet approved or cleared by the [place] FDA and has been authorized for detection and/or diagnosis of SARS-CoV-2 by FDA under an Emergency Use Authorization (EUA). This EUA will remain in effect (meaning this test can be used) for the duration of the COVID-19 declaration under Section 564(b)(1) of the Act, 21 U.S.C. section 360bbb-3(b)(1), unless the authorization is terminated or revoked.  Performed at [facility], [address] [place], [place] [address]     Labs: CBC: Recent Labs  Lab 09/23/21 1923 09/24/21 0456  WBC 8.0 7.3  NEUTROABS  --  3.9  HGB 15.0 13.4  HCT 43.4 40.8  MCV 103.6* 103.6*  PLT 171 354   Basic Metabolic Panel: Recent Labs  Lab 09/23/21 1923 09/24/21 0456  NA 136 139  K 3.4* 3.7  CL 106 109  CO2 21* 25  GLUCOSE 165* 95  BUN 12 10  CREATININE 0.87 0.81  CALCIUM 9.2 8.7*  MG  --  1.8   Liver Function Tests: Recent Labs  Lab 09/23/21 1923 09/24/21 0456  AST 71* 58*  ALT 56* 46*  ALKPHOS 62 53  BILITOT 0.6 0.6  PROT 7.1 6.0*  ALBUMIN 3.8 3.2*   CBG: No results for input(s): GLUCAP in the last 168 hours.  Discharge time spent: greater than 30 minutes.  Signed: Rodena Goldmann, DO [facility] 09/26/2021

## 2021-09-29 ENCOUNTER — Encounter: Payer: Self-pay | Admitting: Cardiology

## 2021-10-21 NOTE — Progress Notes (Signed)
? ? ? ?GI Office Note   ? ?Referring Provider: [name], MD ?Primary Care Physician:  [name], MD  ?Primary Gastroenterologist: Garfield Cornea, MD ? ? ?Chief Complaint  ? ?Chief Complaint  ?Patient presents with  ? Follow-up  ?  Hospital follow up for diverticulitis. Still on antibiotics and still has left lower abd pain.  ? ? ?History of Present Illness  ? ?[name] is a 66 y.o. male presenting today for follow up of hospitalization for sigmoid diverticulitis. This was his second hospitalization, first one in 02/8294 complicated by abscess requiring percutaneous drainage. Second episode noted on CT 11/2020 and most recent episode in 09/2021. Also with elevated transaminases. 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 Typically was drinking 2-5 drinks per day. He also has h/o DVT/PE 08/2021 secondary to Factor V Leiden. H/O GERD with esophageal stricture s/p dilation in 2018. H/O PUD. ? ?Patient states initially he was put on Cipro and Flagyl empirically as outpatient. Failed to respond, so he was seen in the ED, with CT A/P cofirming acute sigmoid diverticulitis. He was given IV Zosyn. At discharged, he was placed on 14 day course of Augmentin which as also been extended by PCP and at this time has been on Augmentin for nearly one month.   ? ?He continues to have pain. Reports similar to when he had perforated diverticulitis with abscess. Pain is in LLQ but into left hip and left groin. Pain is bad enough he has to use his hands to pick up his leg when getting into car, getting up and down in general. He initially thought something was wrong with his hip s/p replacement. He has trouble with left hip pain with walking but tries to walk 3-10 miles daily due his clotting disorder, wants to stay active. States the left groin ligament is much bigger than the right. He has significant fatigue. He saw ortho who does not suspect pain is related to his hip.His BMs are 2-3 times per day,  solid to loose. Eating low fiber diet, no fiber supplements. No change in abdominal pain with BMs. He quit etoh while in hospital.  ? ?Patient has appt with [facility] in April to consider elective partial colectomy.  ?  ?Medications  ? ?Current Outpatient Medications  ?Medication Sig Dispense Refill  ? acetaminophen (TYLENOL) 500 MG tablet Take 2 tablets (1,000 mg total) by mouth every 6 (six) hours as needed for mild pain. 30 tablet 0  ? albuterol (PROVENTIL) (2.5 MG/3ML) 0.083% nebulizer solution Take 2.5 mg by nebulization every 6 (six) hours as needed for wheezing or shortness of breath.    ? albuterol (VENTOLIN HFA) 108 (90 Base) MCG/ACT inhaler Inhale 2 puffs into the lungs every 6 (six) hours as needed for wheezing or shortness of breath.    ? allopurinol (ZYLOPRIM) 300 MG tablet Take 300 mg by mouth daily.    ? amoxicillin-clavulanate (AUGMENTIN) 875-125 MG tablet Take 1 tablet by mouth 2 (two) times daily.    ? apixaban (ELIQUIS) 5 MG TABS tablet Take 1 tablet (5 mg total) by mouth 2 (two) times daily. 60 tablet 3  ? Artificial Tear Solution (SOOTHE XP OP) Place 1 drop into both eyes daily as needed (Dry eyes).    ? Fluticasone-Umeclidin-Vilant (TRELEGY ELLIPTA) 100-62.5-25 MCG/ACT AEPB Inhale 1 puff into the lungs daily. 60 each 5  ? metoprolol succinate (TOPROL XL) 25 MG 24 hr tablet Take 1 tablet (25 mg total) by mouth daily. 90 tablet 3  ?  polyethylene glycol (MIRALAX / GLYCOLAX) 17 g packet Take 17 g by mouth daily as needed for moderate constipation or severe constipation. 14 each 0  ? psyllium (METAMUCIL SMOOTH TEXTURE) 28 % packet Take 1 packet by mouth daily.    ? rosuvastatin (CRESTOR) 20 MG tablet Take 1 tablet (20 mg total) by mouth daily. 90 tablet 3  ? ?No current facility-administered medications for this visit.  ? ? ?Allergies  ? ?Allergies as of 10/22/2021  ? (No Known Allergies)  ?  ? ?Review of Systems  ? ?General: Negative for anorexia, weight loss, fever, chills, fatigue, weakness. ?ENT:  Negative for hoarseness, difficulty swallowing , nasal congestion. ?CV: Negative for chest pain, angina, palpitations, dyspnea on exertion, peripheral edema.  ?Respiratory: Negative for dyspnea at rest, dyspnea on exertion, cough, sputum, wheezing.  ?GI: See history of present illness. ?GU:  Negative for dysuria, hematuria, urinary incontinence, urinary frequency, nocturnal urination.  ?Endo: Negative for unusual weight change.  ?   ?Physical Exam  ? ?BP 128/70 (BP Location: Right Arm, Patient Position: Sitting, Cuff Size: Normal)   Pulse 64   Temp 97.7 ?F (36.5 ?C) (Temporal)   Ht '6\' 1"'$  (1.854 m)   Wt 235 lb 9.6 oz (106.9 kg)   SpO2 95%   BMI 31.08 kg/m?  ?  ?General: Well-nourished, well-developed in no acute distress.  ?Eyes: No icterus. ?Mouth: masked ?Lungs: Clear to auscultation bilaterally.  ?Heart: Regular rate and rhythm, no murmurs rubs or gallops.  ?Abdomen: Bowel sounds are normal,nondistended, no hepatosplenomegaly or masses,  ?no abdominal bruits or hernia , no rebound or guarding. LLQ tenderness and tenderness into left groin and over left hip.  ?Rectal: not performed  ?Extremities: No lower extremity edema. No clubbing or deformities. ?Neuro: Alert and oriented x 4   ?Skin: Warm and dry, no jaundice.   ?Psych: Alert and cooperative, normal mood and affect. ? ?Labs  ? ?Lab Results  ?Component Value Date  ? CREATININE 0.81 09/24/2021  ? BUN 10 09/24/2021  ? NA 139 09/24/2021  ? K 3.7 09/24/2021  ? CL 109 09/24/2021  ? CO2 25 09/24/2021  ? ?Lab Results  ?Component Value Date  ? ALT 46 (H) 09/24/2021  ? AST 58 (H) 09/24/2021  ? ALKPHOS 53 09/24/2021  ? BILITOT 0.6 09/24/2021  ? ?Lab Results  ?Component Value Date  ? WBC 7.3 09/24/2021  ? HGB 13.4 09/24/2021  ? HCT 40.8 09/24/2021  ? MCV 103.6 (H) 09/24/2021  ? PLT 154 09/24/2021  ? ?Lab Results  ?Component Value Date  ? LIPASE 36 09/23/2021  ? ? ?Imaging Studies  ? ?CT ABDOMEN PELVIS W CONTRAST ? ?Result Date: 09/23/2021 ?CLINICAL DATA:  Acute  abdominal pain.  Pain for 2 weeks. EXAM: CT ABDOMEN AND PELVIS WITH CONTRAST TECHNIQUE: Multidetector CT imaging of the abdomen and pelvis was performed using the standard protocol following bolus administration of intravenous contrast. RADIATION DOSE REDUCTION: This exam was performed according to the departmental dose-optimization program which includes automated exposure control, adjustment of the mA and/or kV according to patient size and/or use of iterative reconstruction technique. CONTRAST:  147m OMNIPAQUE IOHEXOL 300 MG/ML  SOLN COMPARISON:  CT 11/14/2020 FINDINGS: Lower chest: Hypoventilatory changes. No acute airspace disease. No pleural effusion. Hepatobiliary: Advanced hepatic steatosis with more focal areas of fatty infiltration and fatty sparing. Enlarged liver spanning 22.3 cm. Gallbladder physiologically distended, no calcified stone. No biliary dilatation. Pancreas: No ductal dilatation or inflammation. Spleen: Normal in size without focal abnormality. Adrenals/Urinary Tract: Normal  adrenal glands. No hydronephrosis or perinephric edema. Homogeneous renal enhancement with symmetric excretion on delayed phase imaging. No renal calculi. Unchanged appearance of bilateral renal cysts. Urinary bladder is physiologically distended without wall thickening. Stomach/Bowel: Acute sigmoid diverticulitis with associated colonic wall thickening and pericolonic edema, series 2, image 70. This is at site of prior diverticulitis. No perforation or abscess. Innumerable additional noninflamed colonic diverticula. Sigmoid colon is redundant. Moderate colonic stool burden. Normal appendix. No small bowel obstruction or inflammation. Small umbilical hernia contains nonobstructed noninflamed small bowel there is a small hiatal hernia. The stomach is decompressed. Vascular/Lymphatic: Aortic atherosclerosis. No aortic aneurysm. Patent portal, splenic, and mesenteric veins. There are small mesenteric nodes measuring up to  7 mm in the sigmoid mesentery. 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 Other: No free air, free fluid, or intra-abdominal fluid collection. Fat containing bilateral inguinal hernias. Umbilical hernia con

## 2021-10-22 ENCOUNTER — Encounter: Payer: Self-pay | Admitting: *Deleted

## 2021-10-22 ENCOUNTER — Ambulatory Visit (INDEPENDENT_AMBULATORY_CARE_PROVIDER_SITE_OTHER): Payer: BC Managed Care – PPO | Admitting: Gastroenterology

## 2021-10-22 ENCOUNTER — Telehealth: Payer: Self-pay | Admitting: *Deleted

## 2021-10-22 ENCOUNTER — Other Ambulatory Visit: Payer: Self-pay

## 2021-10-22 ENCOUNTER — Encounter: Payer: Self-pay | Admitting: Gastroenterology

## 2021-10-22 VITALS — BP 128/70 | HR 64 | Temp 97.7°F | Ht 73.0 in | Wt 235.6 lb

## 2021-10-22 DIAGNOSIS — R1032 Left lower quadrant pain: Secondary | ICD-10-CM | POA: Diagnosis not present

## 2021-10-22 DIAGNOSIS — K5732 Diverticulitis of large intestine without perforation or abscess without bleeding: Secondary | ICD-10-CM | POA: Diagnosis not present

## 2021-10-22 NOTE — Telephone Encounter (Signed)
PA approved via [employer]. Auth# [id number] , Approval Valid Through: 10/22/2021 - 11/20/2021 ?

## 2021-10-22 NOTE — Patient Instructions (Signed)
Plan for CT as discussed. We will be in touch with results as available. ?

## 2021-10-24 ENCOUNTER — Other Ambulatory Visit: Payer: Self-pay

## 2021-10-24 ENCOUNTER — Ambulatory Visit (HOSPITAL_COMMUNITY)
Admission: RE | Admit: 2021-10-24 | Discharge: 2021-10-24 | Disposition: A | Discharge status: Home / Self Care | Payer: BC Managed Care – PPO | Source: Ambulatory Visit | Attending: Gastroenterology | Admitting: Gastroenterology

## 2021-10-24 DIAGNOSIS — R1032 Left lower quadrant pain: Secondary | ICD-10-CM | POA: Insufficient documentation

## 2021-10-24 LAB — POCT I-STAT CREATININE: Creatinine, Ser: 0.8 mg/dL (ref 0.61–1.24)

## 2021-10-24 MED ORDER — IOHEXOL 300 MG/ML  SOLN
100.0000 mL | Freq: Once | INTRAMUSCULAR | Status: AC | PRN
  Administered 2021-10-24: 100 mL via INTRAVENOUS

## 2021-11-14 DIAGNOSIS — R4702 Dysphasia: Secondary | ICD-10-CM | POA: Insufficient documentation

## 2021-11-14 DIAGNOSIS — Z8719 Personal history of other diseases of the digestive system: Secondary | ICD-10-CM | POA: Insufficient documentation

## 2021-11-14 DIAGNOSIS — R200 Anesthesia of skin: Secondary | ICD-10-CM | POA: Insufficient documentation

## 2021-11-14 DIAGNOSIS — Z8709 Personal history of other diseases of the respiratory system: Secondary | ICD-10-CM | POA: Insufficient documentation

## 2021-11-14 DIAGNOSIS — M109 Gout, unspecified: Secondary | ICD-10-CM | POA: Insufficient documentation

## 2021-11-14 DIAGNOSIS — D6851 Activated protein C resistance: Secondary | ICD-10-CM | POA: Insufficient documentation

## 2021-11-14 DIAGNOSIS — Z8711 Personal history of peptic ulcer disease: Secondary | ICD-10-CM | POA: Insufficient documentation

## 2021-11-14 DIAGNOSIS — G8929 Other chronic pain: Secondary | ICD-10-CM | POA: Insufficient documentation

## 2021-11-14 DIAGNOSIS — Z973 Presence of spectacles and contact lenses: Secondary | ICD-10-CM | POA: Insufficient documentation

## 2021-11-14 DIAGNOSIS — M7552 Bursitis of left shoulder: Secondary | ICD-10-CM | POA: Insufficient documentation

## 2021-11-14 DIAGNOSIS — K449 Diaphragmatic hernia without obstruction or gangrene: Secondary | ICD-10-CM | POA: Insufficient documentation

## 2021-11-14 DIAGNOSIS — M5136 Other intervertebral disc degeneration, lumbar region: Secondary | ICD-10-CM | POA: Insufficient documentation

## 2021-11-14 NOTE — Progress Notes (Signed)
?Cardiology Office Note:   ? ?Date:  11/15/2021  ? ?ID:  [name], DOB 1956-06-24, MRN [id number] ? ?PCP:  [name], MD  ?Cardiologist:  Shirlee More, MD   ? ?Referring MD: [name], MD  ? ? ?ASSESSMENT:   ? ?1. Coronary artery disease involving native coronary artery of native heart without angina pectoris   ?2. Pulmonary embolism with acute cor pulmonale, unspecified chronicity, unspecified pulmonary embolism type ([name])   ?3. Chronic anticoagulation   ?4. Deep vein thrombosis (DVT) of popliteal vein of both lower extremities, unspecified chronicity ([name])   ?5. Centrilobular emphysema ([name])   ?6. Mixed hyperlipidemia   ? ?PLAN:   ? ?In order of problems listed above: ? ?In retrospect I think much of his exertional symptoms was due to pulmonary embolism coronary angiography showed nonflow limiting mild CAD at this time I continue medical treatment including his anticoagulant without aspirin beta-blocker and his high intensity statin ?Continue anticoagulation he is hypercoagulable recurrent and I would continue for life and if at all possible defer elective colon surgery for 6 months before interruption ?Continue his high intensity statin ? ? ?Next appointment: 1 year ? ? ?Medication Adjustments/Labs and Tests Ordered: ?Current medicines are reviewed at length with the patient today.  Concerns regarding medicines are outlined above.  ?No orders of the defined types were placed in this encounter. ? ?No orders of the defined types were placed in this encounter. ? ? ?Chief Complaint  ?Patient presents with  ? Follow-up  ? Coronary Artery Disease  ? ? ?History of Present Illness:   ? ?[name] is a 66 y.o. male with a hx of coronary artery calcification on CT scan exertional shortness of breath and COPD last seen by me 07/17/2021. ? ?Compliance with diet, lifestyle and medications: Yes ? ?He underwent cardiac CTA report 07/25/2021 with a coronary calcium score severely elevated 2641/99th  percentile and CAD was present involving proximal left anterior descending coronary artery severe 70 to 99% as well as moderate and distal 50 to 69% left circumflex coronary artery 20 to 49% and right coronary artery PDA 50 to 69%. ? ?He underwent left heart catheterization 08/01/2021 left anterior descending stenosis was felt to be moderate nonflow limiting by FFR with recommendation for medical treatment. ?Conclusion ?  Prox RCA to Mid RCA lesion is 25% stenosed. ?  Ost RCA to Prox RCA lesion is 10% stenosed. ?  Prox Cx to Mid Cx lesion is 25% stenosed. ?  Prox LAD lesion is 50% stenosed. ?  Mid LAD lesion is 30% stenosed. ?  The left ventricular systolic function is normal. ?  LV end diastolic pressure is normal. ?  The left ventricular ejection fraction is 55-65% by visual estimate. ?  There is no mitral valve regurgitation. ?  ?Moderate, calcified proximal LAD stenosis. RFR 0.97-0.96 suggesting this lesion is not flow limiting. Mild mid LAD stenosis.  ?Large caliber Circumflex artery with mild mid stenosis ?Large dominant RCA with mild, calcified proximal and mid vessel stenosis.  ?Normal LVEDP ?Normal LV systolic function, IZTI=45% ?  ?Recommendations: medical management of non-obstructive CAD. His proximal LAD lesion is not flow limiting by pressure wire analysis (RFR 0.96-0.97). Continue ASA and statin. His dyspnea is likely related to his chronic lung disease.  ? Coronary Diagrams ? ?Diagnostic ?Dominance: Right ? ?An echocardiogram performed 08/17/2021 at [facility] left ventricle normal in size moderate LVH EF 60 to 80% grade 1 diastolic dysfunction right ventricle normal size and function  there was no valvular abnormality. ? 1. Left ventricular ejection fraction, by estimation, is 60 to 65%. The  ?left ventricle has normal function. The left ventricle has no regional  ?wall motion abnormalities. There is moderate concentric left ventricular  ?hypertrophy. Left ventricular  ?diastolic parameters are  consistent with Grade I diastolic dysfunction  ?(impaired relaxation).  ? 2. Right ventricular systolic function is normal. The right ventricular  ?size is normal. Tricuspid regurgitation signal is inadequate for assessing  ?PA pressure.  ? 3. The mitral valve is normal in structure. No evidence of mitral valve  ?regurgitation. No evidence of mitral stenosis.  ? 4. The aortic valve is tricuspid. Aortic valve regurgitation is not  ?visualized. No aortic stenosis is present.  ? 5. The inferior vena cava is normal in size with greater than 50%  ?respiratory variability, suggesting right atrial pressure of 3 mmHg.  ? 6. Prominent pericardial fat.  ? ?He subsequently was admitted to [facility] 08/16/2021 with increasing shortness of breath hypoxemia and found to have bilateral pulmonary embolism without signs of RV strain.  He was initiated on anticoagulation. ?IMPRESSION: ?1. Bilateral central and segmental pulmonary emboli. No definitive ?evidence of right heart strain despite moderate clot burden, with ?RV/LV ratio measuring approximately 0.8 and equivocal straightening ?of the interventricular septum. ?2. Aortic Atherosclerosis (ICD10-I70.0) and Emphysema (ICD10-J43.9). ?3. Hepatic steatosis. ?IMPRESSION: ?Findings are consistent with deep venous thrombosis in the right ?popliteal, left popliteal, left posterior tibial and left peroneal ?veins. ? ?He subsequently was admitted to the hospital 09/23/2021 to 09/26/2021 with acute diverticulitis. ?He relates there is consideration of elective colon surgery and I told him in view of his recent pulmonary embolism I would delay 6 months if possible before interrupting his anticoagulants ?He feels better he has less shortness of breath no edema chest pain palpitation or syncope ?He tells me that both he and his daughter have factor V Leiden abnormality and the family has been screened ?I feel he should be anticoagulated for life with recurrent venous  thromboembolism ?Past Medical History:  ?Diagnosis Date  ? Bilateral shoulder bursitis   ? Chronic back pain   ? COPD (chronic obstructive pulmonary disease) with emphysema ([place])   ? DDD (degenerative disc disease), lumbar   ? hx epideral injection  L5--S1   ? Diverticulitis   ? DVT (deep venous thrombosis) ([place]) 11/19/2018  ? Dysphasia   ? intermittant w/ food   ? Dyspnea   ? Dyspnea on exertion   ? GERD (gastroesophageal reflux disease)   ? Gout   ? L great toe  ? Heterozygous factor V Leiden mutation ([place])   ? Hiatal hernia   ? Hip problem 2020  ? left, seeing orthopedics  ? History of acute pancreatitis   ? alcoholic pancreatitis [id number] and 01-30-2012. 2021  ? History of colon polyps   ? 10-17-2005  hyperplastic polyp's  ? History of esophageal stricture   ? s/p  dilation 02-02-2017  ? History of peptic ulcer 1990s  ? History of pneumococcal pneumonia 2006  ? bilateral pneumonia w/ left lower lobe abscess treated w/ chest tube with suction for drainage  ? History of pneumothorax   ? 1984--  left spontaneous pneumothorax ,  treated w/ chest tube  ? Malignant neoplasm of prostate [facility]) urologist-  dr dahlstedt/  oncologist -- dr Tammi Klippel  ? dx 12-02-2016-- Stage T2b, Gleason 3+4,  PSA 5.99,  vol 25cc  ? Numbness of left foot   ? outer left ankle numb--  per pt has appt. w/ neurologist  ? OSA (obstructive sleep apnea)   ? per pt has not used cpap over year ago from 04-16-2017--- per study 01-17-2010  severe osa  ? Solid nodule of lung greater than 8 mm in diameter 05/11/2018  ? 03/2018: RUL Nodule, 54m  ? Wears glasses   ? ? ?Past Surgical History:  ?Procedure Laterality Date  ? BIOPSY  02/02/2017  ? Procedure: BIOPSY;  Surgeon: RDaneil Dolin MD;  [facility];  Service: Endoscopy;;  duodenal bx's, esophgeal bx's  ? BIOPSY  07/30/2017  ? Procedure: BIOPSY;  Surgeon: RDaneil Dolin MD;  [facility];  Service: Endoscopy;;  esophagus  ? BIOPSY  02/15/2021  ? Procedure: BIOPSY;  Surgeon:  RDaneil Dolin MD;  [facility];  Service: Endoscopy;;  ? CARDIOVASCULAR STRESS TEST  09/12/2009  ? normal nuclear perfusion study w/ no ischemia /  normal LV function and wall motion , ef 57%  ? COLONOSCOPY  200

## 2021-11-15 ENCOUNTER — Ambulatory Visit (INDEPENDENT_AMBULATORY_CARE_PROVIDER_SITE_OTHER): Payer: BC Managed Care – PPO | Admitting: Cardiology

## 2021-11-15 ENCOUNTER — Encounter: Payer: Self-pay | Admitting: Cardiology

## 2021-11-15 VITALS — BP 154/82 | HR 56 | Ht 73.0 in | Wt 239.0 lb

## 2021-11-15 DIAGNOSIS — I2510 Atherosclerotic heart disease of native coronary artery without angina pectoris: Secondary | ICD-10-CM | POA: Diagnosis not present

## 2021-11-15 DIAGNOSIS — J432 Centrilobular emphysema: Secondary | ICD-10-CM

## 2021-11-15 DIAGNOSIS — I2609 Other pulmonary embolism with acute cor pulmonale: Secondary | ICD-10-CM

## 2021-11-15 DIAGNOSIS — Z7901 Long term (current) use of anticoagulants: Secondary | ICD-10-CM

## 2021-11-15 DIAGNOSIS — I82433 Acute embolism and thrombosis of popliteal vein, bilateral: Secondary | ICD-10-CM | POA: Diagnosis not present

## 2021-11-15 DIAGNOSIS — E782 Mixed hyperlipidemia: Secondary | ICD-10-CM

## 2021-11-15 NOTE — Patient Instructions (Signed)

## 2021-11-19 ENCOUNTER — Telehealth: Payer: Self-pay | Admitting: Internal Medicine

## 2021-11-19 DIAGNOSIS — K5732 Diverticulitis of large intestine without perforation or abscess without bleeding: Secondary | ICD-10-CM

## 2021-11-19 NOTE — Telephone Encounter (Signed)
Pt states that he was told that he needed to have elective surgery to removal part of his colon due to diverticulitis when he was in the hospital in February. Pt states that he can't remember if it was Dr. Manuella Ghazi or someone from Dr. Olevia Perches office that recommended it. Pt is needing a referral to be able to have this done. Please advise.  ?

## 2021-11-19 NOTE — Telephone Encounter (Signed)
Pt called to follow up on having a referral sent to [facility] for elective surgery on his colon. He said he called and left a message last Friday and [facility] told him they would need a referral from [place]. Please advise. [phone] ?

## 2021-11-19 NOTE — Telephone Encounter (Signed)
I didn't receive any orders for a referral.  ?

## 2021-11-20 NOTE — Telephone Encounter (Signed)
When I saw the patient last month he told me he had an appointment at [facility] in April to consider elective partial colectomy so I did not think he needed a referral.  ? ?Ok to refer to [facility] for consideration of partial colectomy for recurrent diverticulitis/history of complicated diverticulitis.  ?

## 2021-11-20 NOTE — Telephone Encounter (Signed)
Referral sent to [facility] 

## 2021-11-20 NOTE — Addendum Note (Signed)
Addended by: Cheron Every on: 11/20/2021 01:34 PM ? ? Modules accepted: Orders ? ?

## 2021-12-03 IMAGING — RF DG SINUS / FISTULA TRACT / ABSCESSOGRAM
2 series · 8 of 8 positions shown · non-contrast
Comparison: none

INDICATION: 64-year-old male presents for fluoroscopic interrogation of right
lower quadrant abscess drain

[Series 1: sequence · 4 of 13 frames shown (1 of 2)]
[frame 2/13]
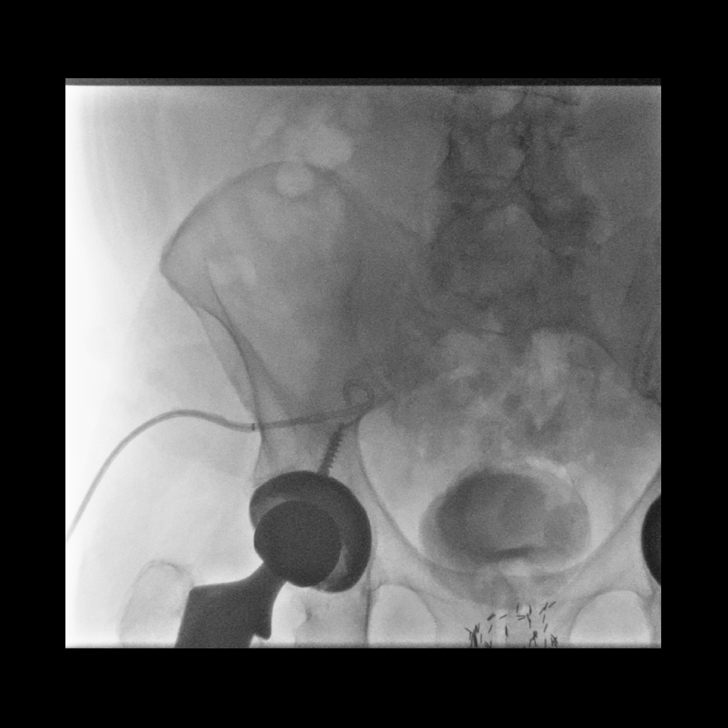
[frame 7/13]
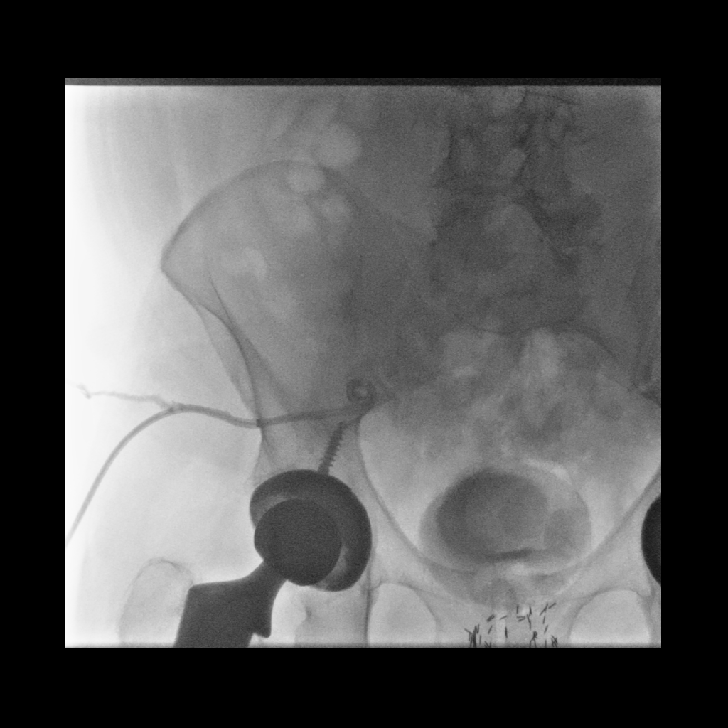
[frame 10/13]
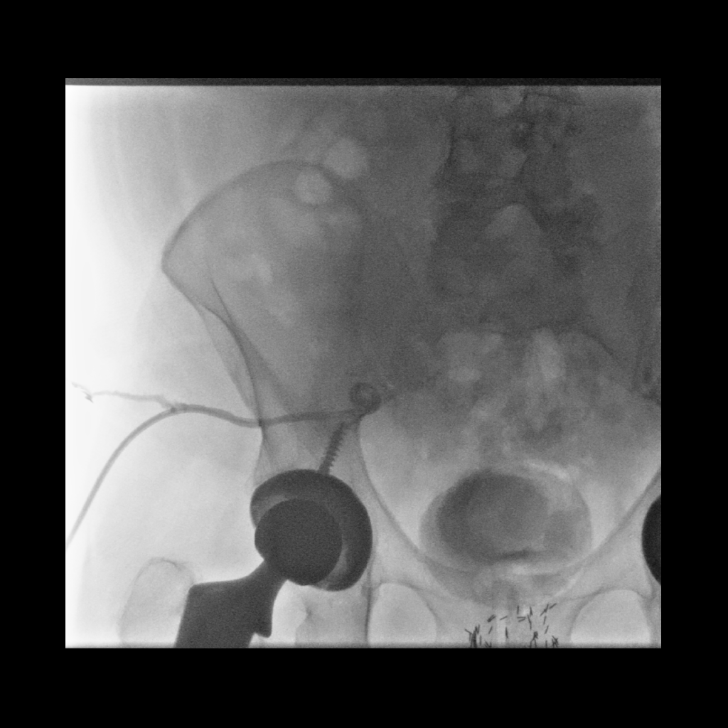
[frame 12/13]
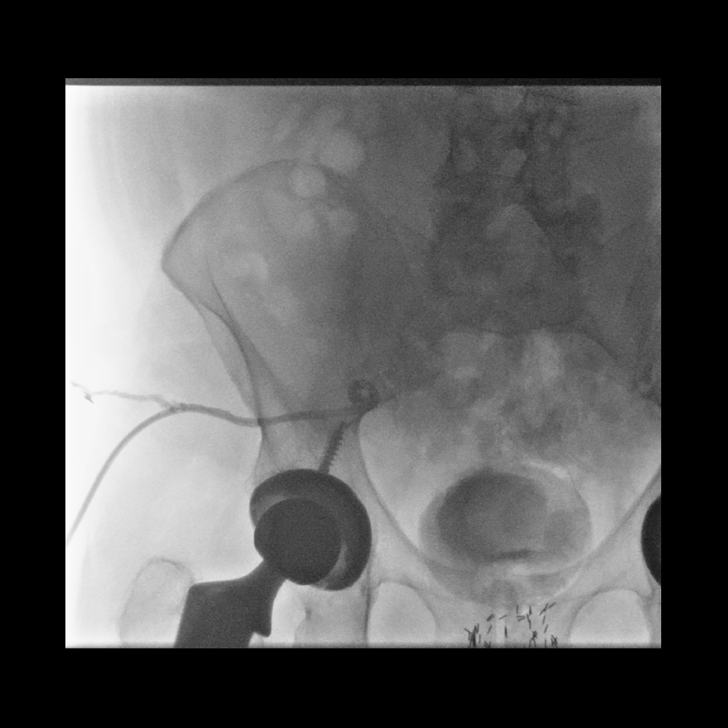

[Series 2: sequence · 4 of 19 frames shown (2 of 2)]
[frame 2/19]
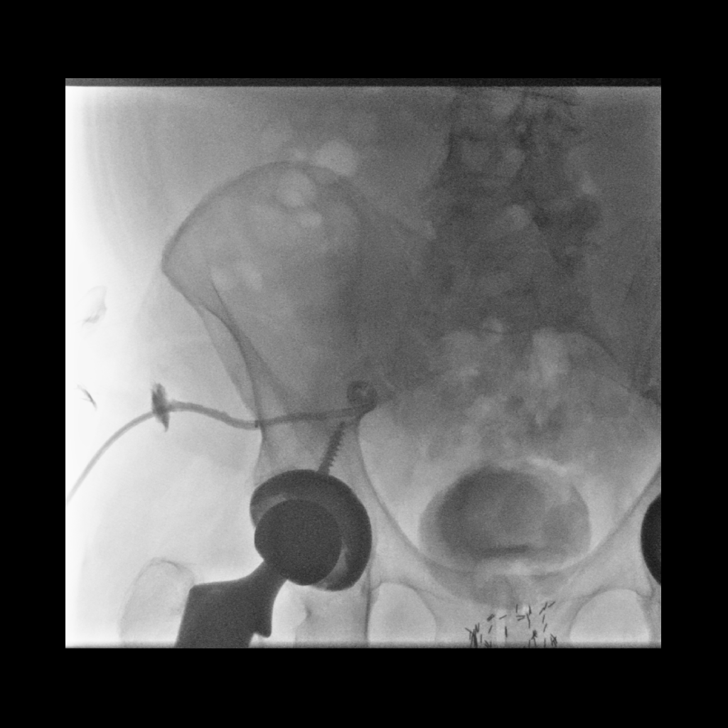
[frame 3/19]
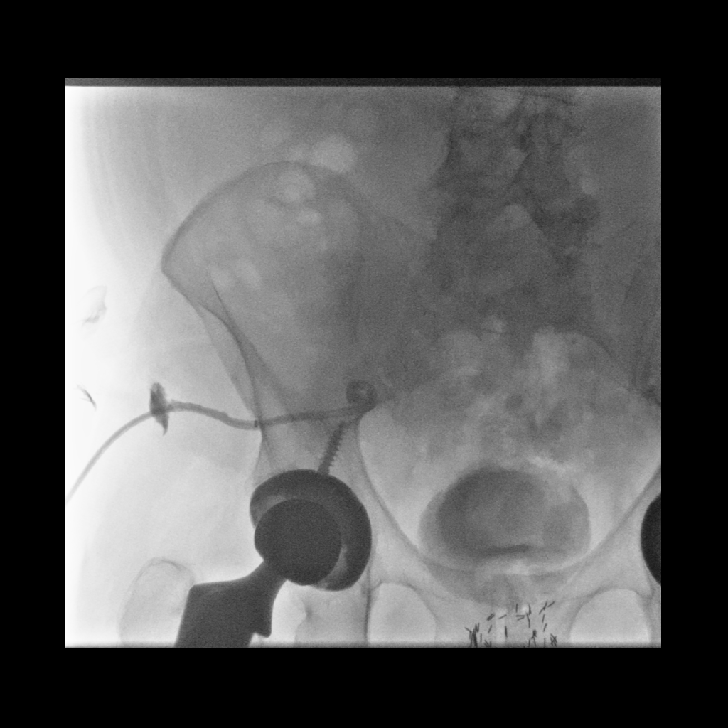
[frame 10/19]
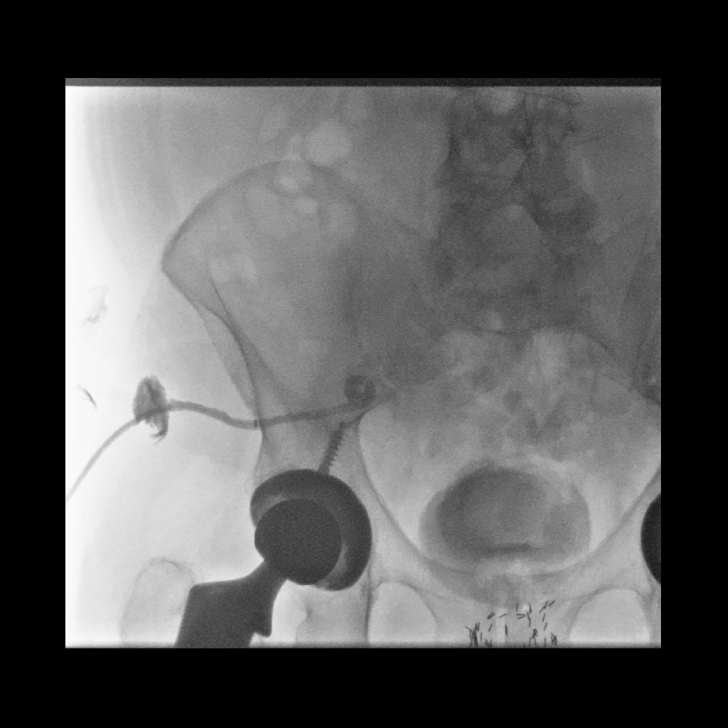
[frame 17/19]
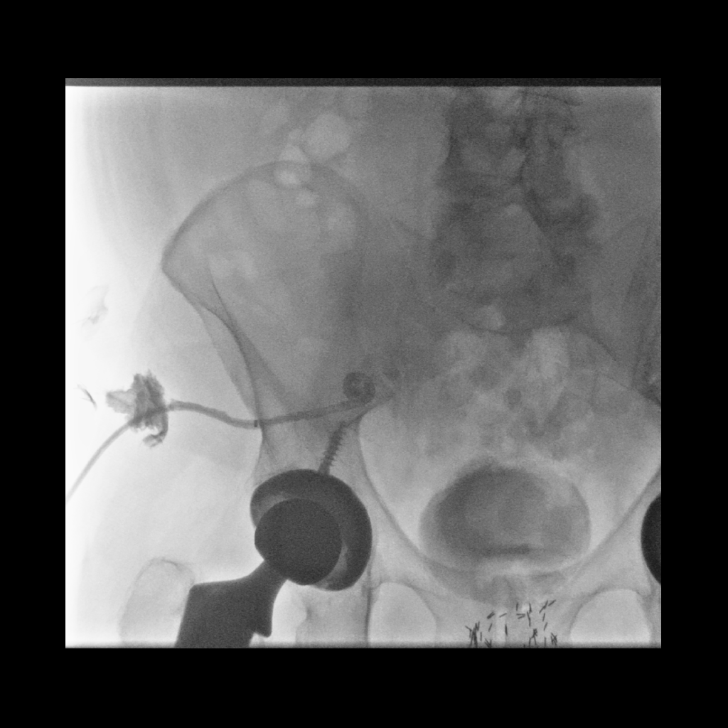

[8 of 8 positions shown; findings below may reference images not displayed]

EXAM:
IMAGE GUIDED DRAIN INJECTION

MEDICATIONS:
None

ANESTHESIA/SEDATION:
None.

COMPLICATIONS:
None

PROCEDURE:
Informed written consent was obtained from the patient after a
thorough discussion of the procedural risks, benefits and
alternatives. All questions were addressed. Maximal Sterile Barrier
Technique was utilized including caps, mask, sterile gowns, sterile
gloves, sterile drape, hand hygiene and skin antiseptic. A timeout
was performed prior to the initiation of the procedure.

Patient positioned supine position on the image intensifier table of
the fluoro room. Scout images were acquired.

Gentle contrast injection was performed.  Images were stored.

The injection demonstrates no evidence of fistula. There is no
abscess. The radiopaque marker has been withdrawn, essentially
externalized with all of the contrast material refluxing.

After review, the drain was removed.
IMPRESSION: Drain injection demonstrates no evidence of fistula of the right
lower quadrant, with all of the injected material exiting the skin
at the drain site. The drain was removed.

## 2021-12-03 IMAGING — CT CT ABD-PELV W/ CM
2 of 4 series · 11 of 46 positions shown, 12 images · IV contrast (iopamidol)
Comparison: 08/14/2020, 08/20/2020, most remote CT reviewed
11/12/2011 and 02/21/2009

CLINICAL DATA: 64-year-old male with a history of prior
diverticulitis, drainage performed 08/15/2020.

EXAM:
CT ABDOMEN AND PELVIS WITH CONTRAST
TECHNIQUE: Multidetector CT imaging of the abdomen and pelvis was performed
using the standard protocol following bolus administration of
intravenous contrast.
CONTRAST:  100mL NAPTHK-T99 IOPAMIDOL (NAPTHK-T99) INJECTION 61%

[Series 2: abd pelvis 5.00 br40 s3 axial · axial · 0.62mm/px · z∈[+1329,+1754]mm · 8 of 103 slices shown, 9 images]
[im 9/103  soft-tissue]
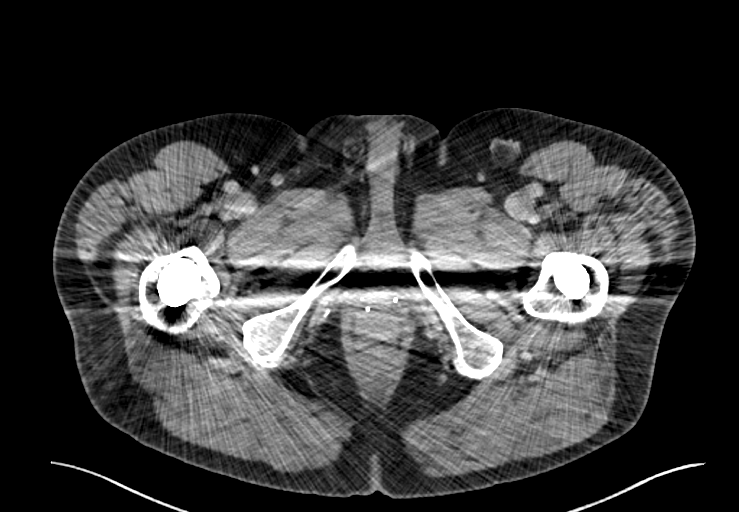
[im 9/103  bone]
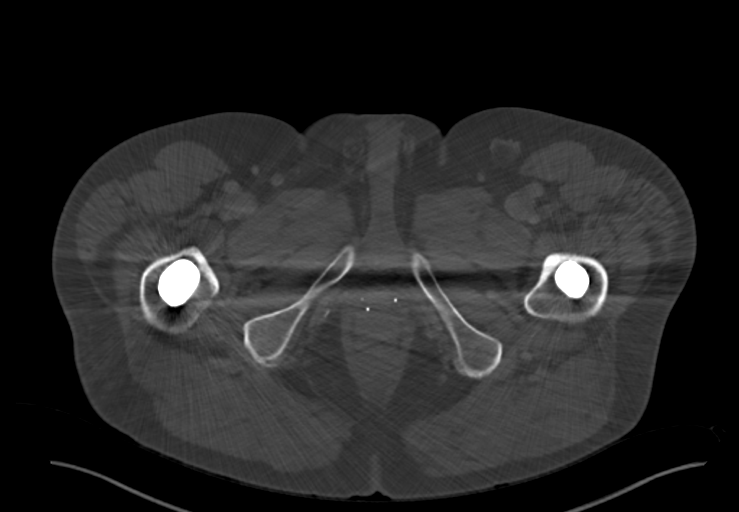
[im 21/103  soft-tissue]
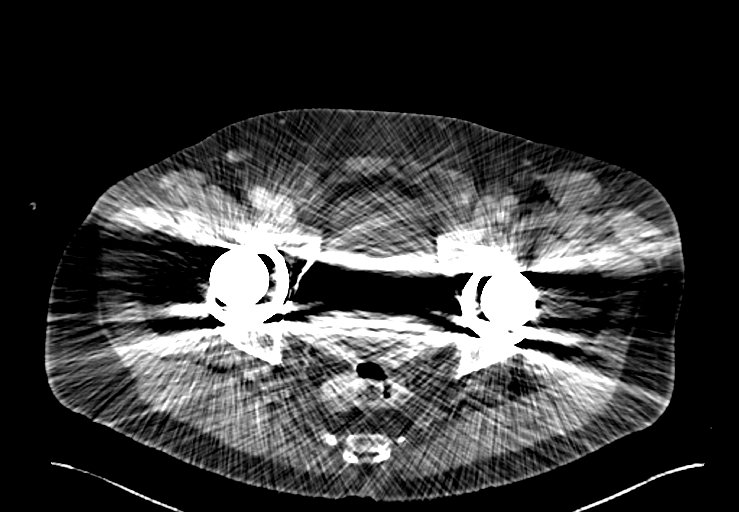
[im 33/103  soft-tissue]
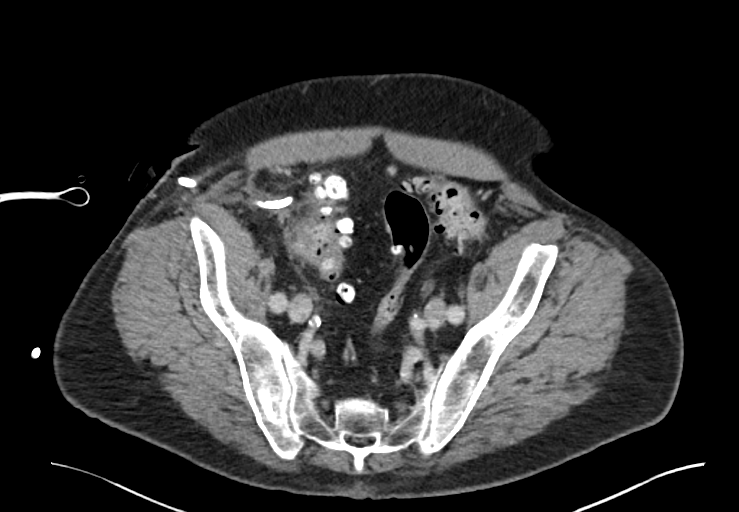
[im 45/103  soft-tissue]
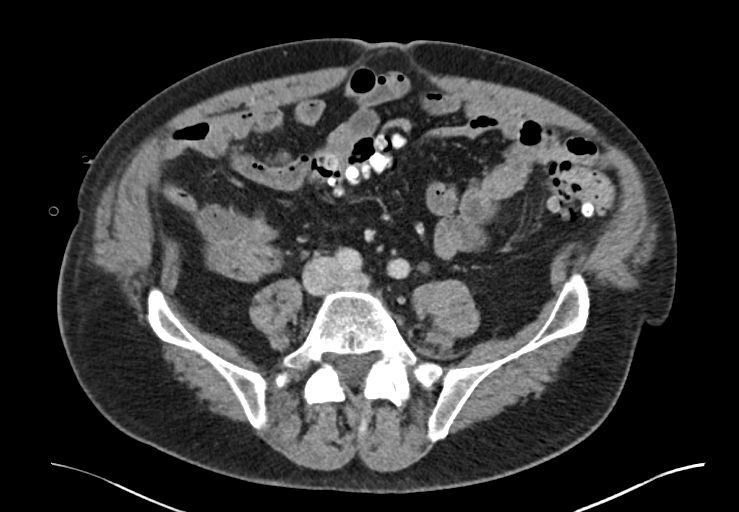
[im 58/103  soft-tissue]
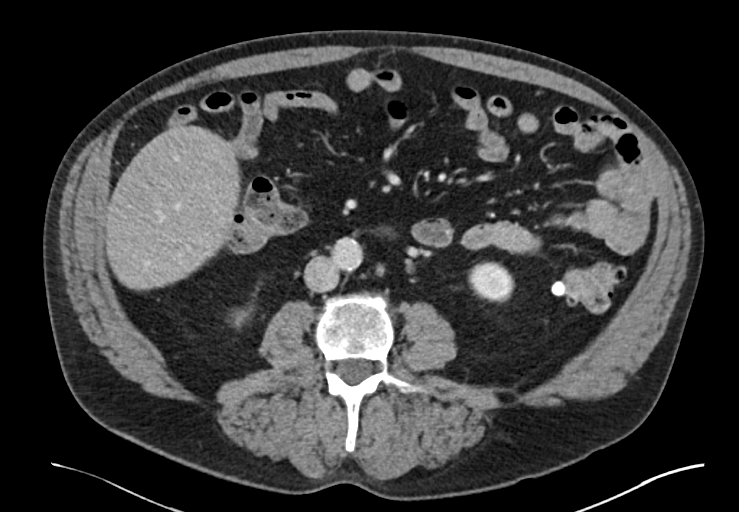
[im 70/103  soft-tissue]
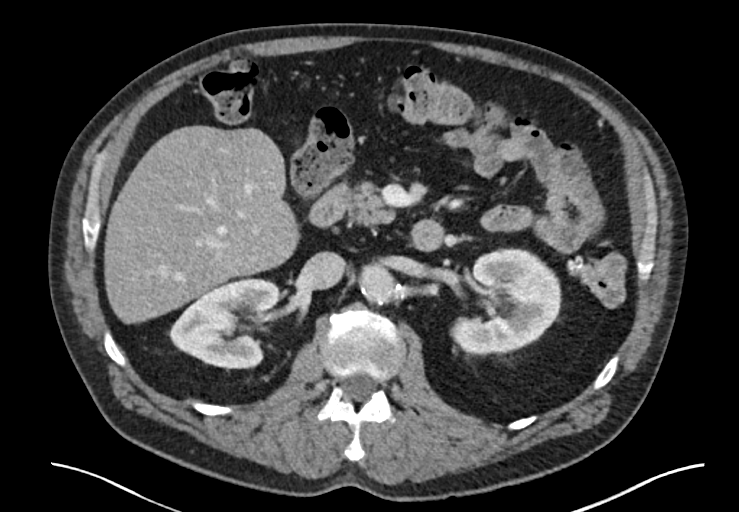
[im 82/103  soft-tissue]
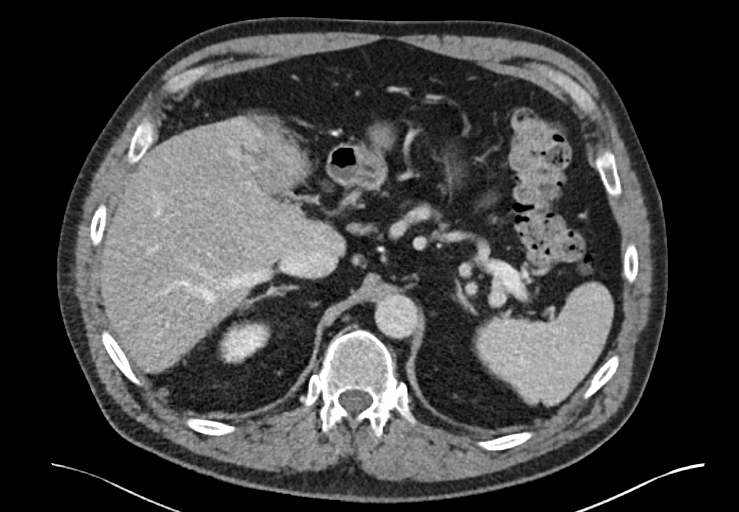
[im 94/103  soft-tissue]
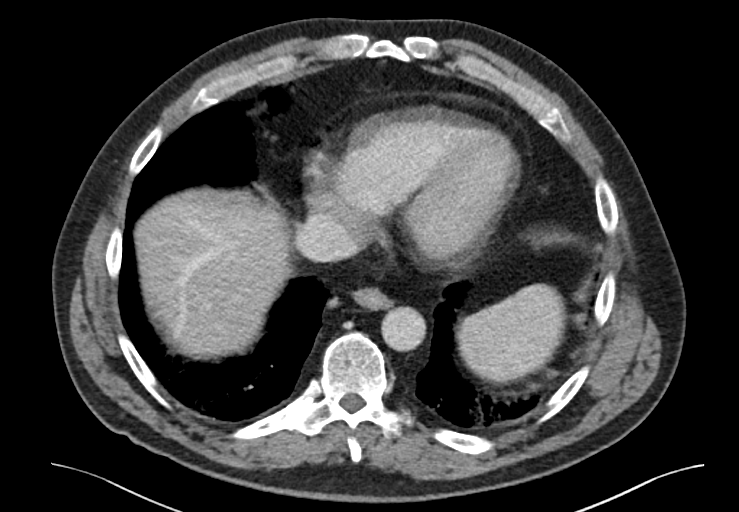

[Series 6: abd pelvis 2.00 br40 s3 cor · coronal · 0.87mm/px · 3 of 155 slices shown]
[im 52/155  soft-tissue]
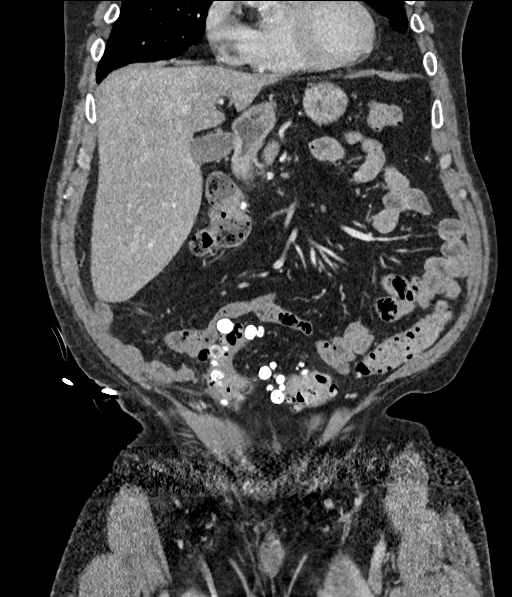
[im 69/155  soft-tissue]
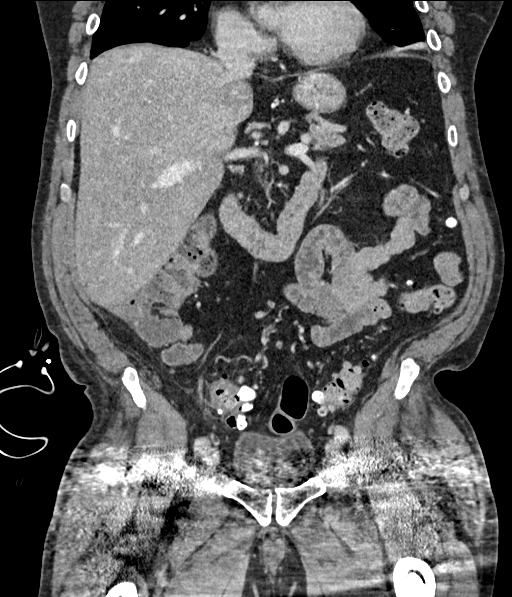
[im 86/155  soft-tissue]
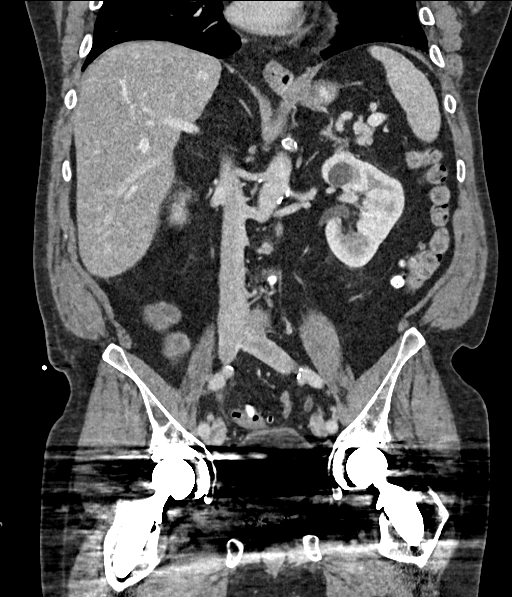

[11 of 46 positions shown; findings below may reference images not displayed]

FINDINGS: Lower chest: Atelectasis/scarring of the dependent lung bases. No
acute finding of the lower lungs.

Hepatobiliary: Similar appearance of left liver lobe
atrophy/hypoplasia, unchanged over the course of the most recent
comparison is a and new from CT study of 02/21/2009. Note that there
is a small left portal vein on the current study, normal previously.
There is geographic hypoperfusion within the left liver and segment
4, which is more pronounced than the prior and likely secondary to
the phase of the CT acquisition. Given the small/atrophic left
portal vein, a prior portal venous thrombosis/infarction is favored
as the cause of left liver atrophy.

Unremarkable gallbladder.

Pancreas: Unremarkable pancreas

Spleen: Unremarkable

Adrenals/Urinary Tract:

- Right adrenal gland:  Unremarkable

- Left adrenal gland: Unremarkable.

- Right kidney: No hydronephrosis, nephrolithiasis, inflammation, or
ureteral dilation. Low-density lesion at the inferior right kidney,
present on the study of 6616 and now measuring 26 mm, only slightly
larger with no internal complexity. This is most compatible with a
simple cyst.

- Left Kidney: No hydronephrosis, nephrolithiasis, inflammation, or
ureteral dilation. Low-density rounded lesion on the anterior cortex
of the left kidney, 20 mm. Unchanged from most recent study of most
compatible with simple cyst.

- Urinary Bladder: Urinary bladder is not well evaluated given the
degree of streak artifact in the pelvis. Appears relatively
decompressed.

Stomach/Bowel:

- Stomach: Hiatal hernia, otherwise unremarkable

- Small bowel: Unremarkable

- Appendix: Normal.

- Colon: Colonic diverticular disease throughout the length of the
colon. No significant wall thickening to indicate acute
inflammation. Compared to the most recent comparison CT studies,
there has been resolution of the inflammatory changes and fluid in
the right lower quadrant.

Percutaneous drainage catheter terminates within the right lower
quadrant, partially withdrawn compared to the prior. The radiopaque
side marker projects at the level of the abdominal wall musculature.
No residual abscess or fluid.

Vascular/Lymphatic: Atherosclerotic changes of the abdominal aorta.
No dissection. No aneurysm. No periaortic fluid. Bilateral iliac and
proximal femoral arteries are patent. Unremarkable venous
structures.

Main portal vein is patent. Right portal vein patent. Left portal
vein appears atrophic, new from 6616.

Reproductive: Brachytherapy seeds of the prostate.

Other: Small fat containing umbilical hernia.

Musculoskeletal: No acute displaced fracture. Degenerative changes
of the spine. Surgical changes bilateral hip arthroplasty
contributing to significant streak artifact of the pelvis.
IMPRESSION: Pigtail drainage catheter within the right lower quadrant, with no
residual abscess and with resolving inflammatory changes of the
prior diverticulitis. The radiopaque marker is at the level of the
abdominal wall musculature, partially withdrawn from the placement.

Diverticular disease without evidence of ongoing/recurrent acute
changes.

Since 6616 there has been development of atrophic left liver, most
likely secondary to a remote left portal vein thrombus given the
appearance and in the absence of surgical changes. This most likely
accounts for the enlarged right liver on the cranial caudal
dimension without overt signs of cirrhosis.

Additional ancillary findings as above.

## 2021-12-09 ENCOUNTER — Encounter: Payer: Self-pay | Admitting: Urology

## 2021-12-10 ENCOUNTER — Encounter: Payer: Self-pay | Admitting: Urology

## 2021-12-10 ENCOUNTER — Ambulatory Visit (INDEPENDENT_AMBULATORY_CARE_PROVIDER_SITE_OTHER): Payer: BC Managed Care – PPO | Admitting: Urology

## 2021-12-10 VITALS — BP 109/65 | HR 87

## 2021-12-10 DIAGNOSIS — Z8546 Personal history of malignant neoplasm of prostate: Secondary | ICD-10-CM | POA: Diagnosis not present

## 2021-12-10 NOTE — Progress Notes (Signed)
H&P ? ?Chief Complaint: Left upper thigh pain/swelling ? ?History of Present Illness: 66 year old male urgently worked in for a 53-monthhistory of left upper thigh swelling and pain.  Some symptoms on the right as well.  This is becoming more bothersome.  Difficult to walk and flex his upper leg.  Some symptoms at night, mostly during the daytime with him sitting up and trying to stand.  He has seen an orthopedist, been treated for diverticulitis earlier this year.  The orthopedist does not think that this is a problem related to his prior total hip arthroplasty. ? ?Urologic history as follows: ? ?He underwent TRUS/Bx on 5.1.2018. He had a 4 mm left apical nodule, a PSA of 5.99. Prostatic volume was 17 g. PSA density 0.35.  ?6/12 cores were positive for adenocarcinoma, all on the left side of the prostate.  ?3 cores revealed GG 1 pattern ?3 cores revealed GG 2 pattern ?IPSS pre-procedure: 2/0  ? ?He underwent I-125 brachytherapy on 9.20.2018. ?  ?6.15.2021: PSA undetectable.  Mild Peyronie's, downward curvature.  Also with ED and urinary urgency. ?  ?6.28.2022: PSA undetectable. He has been treated for diverticulitis x 2 since January.  He has a good urinary stream.  He does have continued urgency.  No gross hematuria, no blood in his stool.  Currently, he is not eager to have a prescription for Viagra, but will talk with his wife about that. ? ?5.9.2023: He is having no gross hematuria or significant lower urinary tract symptoms.  Still has issues with ED but is not currently worried about this. ? ?Past Medical History:  ?Diagnosis Date  ? Bilateral shoulder bursitis   ? Chronic back pain   ? COPD (chronic obstructive pulmonary disease) with emphysema ([place]   ? DDD (degenerative disc disease), lumbar   ? hx epideral injection  L5--S1   ? Diverticulitis   ? [place]) [place] 11/19/2018  ? Dysphasia   ? intermittant w/ food   ? Dyspnea   ? Dyspnea on exertion   ? GERD (gastroesophageal reflux  disease)   ? Gout   ? L great toe  ? Heterozygous factor V Leiden mutation ([place]   ? Hiatal hernia   ? Hip problem 2020  ? left, seeing orthopedics  ? History of acute pancreatitis   ? alcoholic pancreatitis [id number] 01-30-2012. 2021  ? History of colon polyps   ? 10-17-2005  hyperplastic polyp's  ? History of esophageal stricture   ? s/p  dilation 02-02-2017  ? History of peptic ulcer 1990s  ? History of pneumococcal pneumonia 2006  ? bilateral pneumonia w/ left lower lobe abscess treated w/ chest tube with suction for drainage  ? History of pneumothorax   ? 1984--  left spontaneous pneumothorax ,  treated w/ chest tube  ? Malignant neoplasm of prostate ([facility] urologist-  dr Esparanza Krider/  oncologist -- dr mTammi Klippel ? dx 12-02-2016-- Stage T2b, Gleason 3+4,  PSA 5.99,  vol 25cc  ? Numbness of left foot   ? outer left ankle numb-- per pt has appt. w/ neurologist  ? OSA (obstructive sleep apnea)   ? per pt has not used cpap over year ago from 04-16-2017--- per study 01-17-2010  severe osa  ? Solid nodule of lung greater than 8 mm in diameter 05/11/2018  ? 03/2018: RUL Nodule, 149m ? Wears glasses   ? ? ?Past Surgical History:  ?Procedure Laterality Date  ? BIOPSY  02/02/2017  ? Procedure: BIOPSY;  Surgeon: Daneil Dolin, MD;  [facility];  Service: Endoscopy;;  duodenal bx's, esophgeal bx's  ? BIOPSY  07/30/2017  ? Procedure: BIOPSY;  Surgeon: Daneil Dolin, MD;  [facility];  Service: Endoscopy;;  esophagus  ? BIOPSY  02/15/2021  ? Procedure: BIOPSY;  Surgeon: Daneil Dolin, MD;  [facility];  Service: Endoscopy;;  ? CARDIOVASCULAR STRESS TEST  09/12/2009  ? normal nuclear perfusion study w/ no ischemia /  normal LV function and wall motion , ef 57%  ? COLONOSCOPY  2007  ? Dr. Gala Romney: hyperplastic polyps, internal hemorrhoids   ? COLONOSCOPY WITH PROPOFOL N/A 05/03/2018  ? Dr. Gala Romney: diverticulosis in entire colon, multiple polyps in sigmoid, at splenic flexure, and  ascending colon, not all polyps removed. Tubular adenomas and inflammatory polyps. Needs 3 year surveilance  ? COLONOSCOPY WITH PROPOFOL N/A 02/15/2021  ? Many polyps without adenomatous changes but all inflammatory in nature with mildly active colitis and chronic inflammatory mucosa.  ? EPIDURAL BLOCK INJECTION  2020  ? ESOPHAGOGASTRODUODENOSCOPY (EGD) WITH PROPOFOL N/A 02/02/2017  ? Dr. Gala Romney: esophageal stenosis s/p dilation and biopsy, medium sized hiatal hernia, normal duodenum  ? ESOPHAGOGASTRODUODENOSCOPY (EGD) WITH PROPOFOL N/A 07/30/2017  ? Dr. Gala Romney: esophageal stenosis s/p dilation and biopsy, medium-sized hiatal hernia, normal duodenum  ? HEMORRHOID SURGERY  01-21-2008    [facility]  ? INTRAVASCULAR PRESSURE WIRE/FFR STUDY N/A 08/01/2021  ? Procedure: INTRAVASCULAR PRESSURE WIRE/FFR STUDY;  Surgeon: Burnell Blanks, MD;  Location: [place] [facility];  Service: Cardiovascular;  Laterality: N/A;  ? IR RADIOLOGIST EVAL & MGMT  08/29/2020  ? LEFT HEART CATH AND CORONARY ANGIOGRAPHY N/A 08/01/2021  ? Procedure: LEFT HEART CATH AND CORONARY ANGIOGRAPHY;  Surgeon: Burnell Blanks, MD;  Location: [place] [facility];  Service: Cardiovascular;  Laterality: N/A;  ? MALONEY DILATION N/A 02/02/2017  ? Procedure: MALONEY DILATION;  Surgeon: Daneil Dolin, MD;  [facility];  Service: Endoscopy;  Laterality: N/A;  ? MALONEY DILATION N/A 07/30/2017  ? Procedure: MALONEY DILATION;  Surgeon: Daneil Dolin, MD;  [facility];  Service: Endoscopy;  Laterality: N/A;  ? POLYPECTOMY  05/03/2018  ? Procedure: POLYPECTOMY;  Surgeon: Daneil Dolin, MD;  [facility];  Service: Endoscopy;;  ascending colon polyp, sigmoid colon polyps (multiple)  ? POLYPECTOMY  02/15/2021  ? Procedure: POLYPECTOMY;  Surgeon: Daneil Dolin, MD;  [facility];  Service: Endoscopy;;  ? RADIOACTIVE SEED IMPLANT N/A 04/23/2017  ? Procedure: RADIOACTIVE SEED IMPLANT/BRACHYTHERAPY  IMPLANT;  Surgeon: Franchot Gallo, MD;  Location: [facility];  Service: Urology;  Laterality: N/A;  ? SHOULDER ARTHROSCOPY  08/05/2003  ? SPACE OAR INSTILLATION N/A 04/23/2017  ? Procedure: SPACE OAR INSTILLATION;  Surgeon: Franchot Gallo, MD;  Location: [facility];  Service: Urology;  Laterality: N/A;  ? TRANSTHORACIC ECHOCARDIOGRAM  03/28/2009  ? mild LVH, ef 55-60%/ mild aorta calcification  ? VASECTOMY  08/05/1983  ? VIDEO ASSISTED THORACOSCOPY (VATS)/DECORTICATION Left   ? ? ?Home Medications:  ?Allergies as of 12/10/2021   ?No Known Allergies ?  ? ?  ?Medication List  ?  ? ?  ? Accurate as of Dec 10, 2021  1:50 PM. If you have any questions, ask your nurse or doctor.  ?  ?  ? ?  ? ?acetaminophen 500 MG tablet ?Commonly known as: TYLENOL ?Take 2 tablets (1,000 mg total) by mouth every 6 (six) hours  as needed for mild pain. ?  ?albuterol 108 (90 Base) MCG/ACT inhaler ?Commonly known as: VENTOLIN HFA ?Inhale 2 puffs into the lungs every 6 (six) hours as needed for wheezing or shortness of breath. ?  ?albuterol (2.5 MG/3ML) 0.083% nebulizer solution ?Commonly known as: PROVENTIL ?Take 2.5 mg by nebulization every 6 (six) hours as needed for wheezing or shortness of breath. ?  ?allopurinol 300 MG tablet ?Commonly known as: ZYLOPRIM ?Take 300 mg by mouth daily. ?  ?apixaban 5 MG Tabs tablet ?Commonly known as: ELIQUIS ?Take 1 tablet (5 mg total) by mouth 2 (two) times daily. ?  ?metoprolol succinate 25 MG 24 hr tablet ?Commonly known as: Toprol XL ?Take 1 tablet (25 mg total) by mouth daily. ?  ?rosuvastatin 20 MG tablet ?Commonly known as: CRESTOR ?Take 1 tablet (20 mg total) by mouth daily. ?  ?Trelegy Ellipta 100-62.5-25 MCG/ACT Aepb ?Generic drug: Fluticasone-Umeclidin-Vilant ?Inhale 1 puff into the lungs daily. ?  ? ?  ? ? ?Allergies: No Known Allergies ? ?Family History  ?Problem Relation Age of Onset  ? Diabetes Mother   ? Heart disease Father   ? Heart disease Brother   ?  Cancer Neg Hx   ? Colon cancer Neg Hx   ? Colon polyps Neg Hx   ? Neuropathy Neg Hx   ? ? ?Social History:  reports that he quit smoking about 6 years ago. His smoking use included cigarettes and e-cigarettes. He has a 30

## 2021-12-10 NOTE — Addendum Note (Signed)
Addended byIris Pert on: 12/10/2021 03:57 PM ? ? Modules accepted: Orders ? ?

## 2021-12-11 LAB — URINALYSIS, ROUTINE W REFLEX MICROSCOPIC
Bilirubin, UA: NEGATIVE
Glucose, UA: NEGATIVE
Leukocytes,UA: NEGATIVE
Nitrite, UA: NEGATIVE
RBC, UA: NEGATIVE
Specific Gravity, UA: 1.02 (ref 1.005–1.030)
Urobilinogen, Ur: 0.2 mg/dL (ref 0.2–1.0)
pH, UA: 5.5 (ref 5.0–7.5)

## 2021-12-11 LAB — MICROSCOPIC EXAMINATION: Bacteria, UA: NONE SEEN

## 2021-12-11 LAB — PSA: Prostate Specific Ag, Serum: <0.1 ng/mL (ref 0.0–4.0)

## 2021-12-14 ENCOUNTER — Encounter: Payer: Self-pay | Admitting: Urology

## 2021-12-17 ENCOUNTER — Ambulatory Visit (HOSPITAL_COMMUNITY)
Admission: RE | Admit: 2021-12-17 | Discharge: 2021-12-17 | Disposition: A | Discharge status: Home / Self Care | Payer: BC Managed Care – PPO | Source: Ambulatory Visit | Attending: Nurse Practitioner | Admitting: Nurse Practitioner

## 2021-12-17 ENCOUNTER — Other Ambulatory Visit: Payer: Self-pay | Admitting: Nurse Practitioner

## 2021-12-17 ENCOUNTER — Other Ambulatory Visit (HOSPITAL_COMMUNITY): Payer: Self-pay | Admitting: Nurse Practitioner

## 2021-12-17 DIAGNOSIS — R2242 Localized swelling, mass and lump, left lower limb: Secondary | ICD-10-CM

## 2021-12-18 ENCOUNTER — Other Ambulatory Visit: Payer: Self-pay | Admitting: Family Medicine

## 2021-12-18 ENCOUNTER — Other Ambulatory Visit (HOSPITAL_COMMUNITY): Payer: Self-pay | Admitting: Nurse Practitioner

## 2021-12-18 ENCOUNTER — Other Ambulatory Visit (HOSPITAL_COMMUNITY): Payer: Self-pay | Admitting: Family Medicine

## 2021-12-18 ENCOUNTER — Encounter (HOSPITAL_COMMUNITY): Payer: Self-pay | Admitting: Radiology

## 2021-12-18 ENCOUNTER — Other Ambulatory Visit (HOSPITAL_COMMUNITY)
Admission: RE | Admit: 2021-12-18 | Discharge: 2021-12-18 | Disposition: A | Discharge status: Home / Self Care | Payer: BC Managed Care – PPO | Attending: *Deleted | Admitting: *Deleted

## 2021-12-18 ENCOUNTER — Other Ambulatory Visit: Payer: Self-pay | Admitting: Nurse Practitioner

## 2021-12-18 ENCOUNTER — Ambulatory Visit (HOSPITAL_COMMUNITY)
Admission: RE | Admit: 2021-12-18 | Discharge: 2021-12-18 | Disposition: A | Discharge status: Home / Self Care | Payer: BC Managed Care – PPO | Source: Ambulatory Visit | Attending: Nurse Practitioner | Admitting: Nurse Practitioner

## 2021-12-18 ENCOUNTER — Ambulatory Visit (HOSPITAL_COMMUNITY)
Admission: RE | Admit: 2021-12-18 | Discharge: 2021-12-18 | Disposition: A | Discharge status: Home / Self Care | Payer: BC Managed Care – PPO | Source: Ambulatory Visit | Attending: Family Medicine | Admitting: Family Medicine

## 2021-12-18 DIAGNOSIS — R1030 Lower abdominal pain, unspecified: Secondary | ICD-10-CM | POA: Diagnosis present

## 2021-12-18 DIAGNOSIS — R102 Pelvic and perineal pain: Secondary | ICD-10-CM

## 2021-12-18 DIAGNOSIS — M6281 Muscle weakness (generalized): Secondary | ICD-10-CM

## 2021-12-18 LAB — COMPREHENSIVE METABOLIC PANEL
ALT: 37 U/L (ref 0–44)
AST: 45 U/L — ABNORMAL HIGH (ref 15–41)
Albumin: 3.6 g/dL (ref 3.5–5.0)
Alkaline Phosphatase: 56 U/L (ref 38–126)
Anion gap: 10 (ref 5–15)
BUN: 6 mg/dL — ABNORMAL LOW (ref 8–23)
CO2: 25 mmol/L (ref 22–32)
Calcium: 9 mg/dL (ref 8.9–10.3)
Chloride: 105 mmol/L (ref 98–111)
Creatinine, Ser: 0.58 mg/dL — ABNORMAL LOW (ref 0.61–1.24)
GFR, Estimated: >60 mL/min (ref >60)
Glucose, Bld: 123 mg/dL — ABNORMAL HIGH (ref 70–99)
Potassium: 3.5 mmol/L (ref 3.5–5.1)
Sodium: 140 mmol/L (ref 135–145)
Total Bilirubin: 0.7 mg/dL (ref 0.3–1.2)
Total Protein: 6.8 g/dL (ref 6.5–8.1)

## 2021-12-18 LAB — CBC
HCT: 41.8 % (ref 39.0–52.0)
Hemoglobin: 14.4 g/dL (ref 13.0–17.0)
MCH: 33.5 pg (ref 26.0–34.0)
MCHC: 34.4 g/dL (ref 30.0–36.0)
MCV: 97.2 fL (ref 80.0–100.0)
Platelets: 157 10*3/uL (ref 150–400)
RBC: 4.3 MIL/uL (ref 4.22–5.81)
RDW: 13.2 % (ref 11.5–15.5)
WBC: 8.8 10*3/uL (ref 4.0–10.5)
nRBC: 0 % (ref 0.0–0.2)

## 2021-12-18 LAB — C-REACTIVE PROTEIN: CRP: 3 mg/dL — ABNORMAL HIGH (ref <1.0)

## 2021-12-18 LAB — SEDIMENTATION RATE: Sed Rate: 9 mm/hr (ref 0–16)

## 2021-12-18 MED ORDER — IOHEXOL 300 MG/ML  SOLN
100.0000 mL | Freq: Once | INTRAMUSCULAR | Status: AC | PRN
  Administered 2021-12-18: 100 mL via INTRAVENOUS

## 2021-12-18 MED ORDER — IOHEXOL 9 MG/ML PO SOLN
ORAL | Status: AC
  Filled 2021-12-18: qty 1000

## 2021-12-23 ENCOUNTER — Ambulatory Visit: Payer: Self-pay | Admitting: General Surgery

## 2021-12-23 NOTE — H&P (Signed)
REFERRING PHYSICIAN:  Neil Crouch, PA   PROVIDER:  Monico Blitz, MD   MRN: [id number] DOB: 1956-05-18 DATE OF ENCOUNTER: 12/23/2021   Subjective    Chief Complaint: New Consultation       History of Present Illness: [name] is a 66 y.o. male who is seen today as an office consultation at the request of Dr. Bobby Rumpf for evaluation of New Consultation .  66 year old male with a history of complicated diverticulitis with need for inpatient admission with IV antibiotics and abscess drain in February 2023.  Patient with a history of coronary artery disease and COPD who was admitted in January 2023 for bilateral pulmonary embolism.  He underwent treatment for this and then was admitted to the hospital with diverticulitis in late February.  This was controlled with IV antibiotics and a drain placed in the right lower quadrant.  He has subsequently recovered for this but continues to have occasional episodes of pain.  Last colonoscopy was performed in the fall of last year.  No malignancies were noted.  Patient has no past surgical history within his abdomen.  He is undergoing work-up of some left leg pain that occurs when he lifts his leg.     Review of Systems: A complete review of systems was obtained from the patient.  I have reviewed this information and discussed as appropriate with the patient.  See HPI as well for other ROS.     Medical History: Past Medical History      Past Medical History:  Diagnosis Date   COPD (chronic obstructive pulmonary disease) (CMS-[name])     DVT (deep venous thrombosis) (CMS-[name])     History of cancer     Pulmonary hypertension (CMS-[name])     Sleep apnea          There is no problem list on file for this patient.     Past Surgical History       Past Surgical History:  Procedure Laterality Date   JOINT REPLACEMENT            Allergies  No Known Allergies           Current Outpatient Medications on File Prior to Visit   Medication Sig Dispense Refill   allopurinoL (ZYLOPRIM) 300 MG tablet Take by mouth       apixaban (ELIQUIS) 5 mg tablet Take 5 mg by mouth 2 (two) times daily       metoprolol succinate (TOPROL-XL) 25 MG XL tablet Take 1 tablet by mouth once daily       rosuvastatin (CRESTOR) 20 MG tablet Take 20 mg by mouth once daily       albuterol 90 mcg/actuation inhaler Inhale into the lungs       doxycycline (VIBRAMYCIN) 50 MG capsule TAKE 1 CAPSULE BY MOUTH DAILY WITH FOOD       TRELEGY ELLIPTA 100-62.5-25 mcg inhaler Inhale 1 inhalation into the lungs once daily        No current facility-administered medications on file prior to visit.      Family History       Family History  Problem Relation Age of Onset   Obesity Mother     Diabetes Mother     Coronary Artery Disease (Blocked arteries around heart) Father          Social History        Tobacco Use  Smoking Status Former   Types: Cigarettes  Smokeless Tobacco  Never      Social History  Social History         Socioeconomic History   Marital status: Married  Tobacco Use   Smoking status: Former      Types: Cigarettes   Smokeless tobacco: Never  Vaping Use   Vaping Use: Unknown  Substance and Sexual Activity   Alcohol use: Yes   Drug use: Never        Objective:         Vitals:    12/23/21 0950  BP: (!) 146/86  Pulse: 73  Temp: 36.6 C (97.8 F)  SpO2: 97%  Weight: (!) 108.7 kg (239 lb 9.6 oz)  Height: 188 cm ('6\' 2"'$ )      Exam Gen: NAD CV: RRR Lungs: CTA Abd: soft        Labs, Imaging and Diagnostic Testing: CT scans from the past 6 months reviewed.  Patient had a significant inflammatory phlegmon in the right lower quadrant involving the terminal ileum and cecum.  I am able to visualize the appendix well.  This appears to be sitting over the right ureter as well.   Assessment and Plan:  Diagnoses and all orders for this visit:   Diverticular disease     66 year old male with COPD and history  of pulmonary embolism who presents to the office today for evaluation of recurrent diverticulitis.  His last episode was complicated by abscess requiring drain.  He is now recovered from this and he is interested in pursuing a prophylactic partial colectomy.  It appears he is completing a 74-monthcourse of anticoagulation.  He will continue this lifelong but it sounds like he can come off of it for surgery after 6 months.  We will confirm this with his medical doctors and obtain appropriate risk stratification prior to his surgery.  We have discussed this in detail with the patient.  All questions were answered.   The surgery and anatomy were described to the patient as well as the risks of surgery and the possible complications.  These include: Bleeding, deep abdominal infections and possible wound complications such as hernia and infection, damage to adjacent structures, leak of surgical connections, which can lead to other surgeries and possibly an ostomy, possible need for other procedures, such as abscess drains in radiology, possible prolonged hospital stay, possible diarrhea from removal of part of the colon, possible constipation from narcotics, possible bowel, bladder or sexual dysfunction if having rectal surgery, prolonged fatigue/weakness or appetite loss, possible early recurrence of of disease, possible complications of their medical problems such as heart disease or arrhythmias or lung problems, death (less than 1%). I believe the patient understands and wishes to proceed with the surgery.    ARosario Adie MD  [facility]

## 2021-12-24 ENCOUNTER — Other Ambulatory Visit (HOSPITAL_COMMUNITY): Payer: Self-pay | Admitting: Family Medicine

## 2021-12-24 ENCOUNTER — Other Ambulatory Visit: Payer: Self-pay | Admitting: Family Medicine

## 2021-12-24 DIAGNOSIS — M48061 Spinal stenosis, lumbar region without neurogenic claudication: Secondary | ICD-10-CM

## 2021-12-24 DIAGNOSIS — M545 Low back pain, unspecified: Secondary | ICD-10-CM

## 2021-12-26 ENCOUNTER — Telehealth: Payer: Self-pay

## 2021-12-26 NOTE — Telephone Encounter (Signed)
   Pre-operative Risk Assessment    Patient Name: [name]  DOB: 07-10-1956 MRN: [id number]      Request for Surgical Clearance    Procedure:   Colectomy  Date of Surgery:  Clearance TBD                                 Surgeon:  Leighton Ruff, MD Surgeon's Group or Practice Name:  [facility] Phone number:  [phone] Fax number:  [phone]   Type of Clearance Requested:   - Pharmacy:  Hold Apixaban (Eliquis) Eliquis   Type of Anesthesia:  General    Additional requests/questions:   Please call to advise if this patient will require an office visit or further medical work-up before clearance can be given  Signed, Toni Arthurs   12/26/2021, 7:12 AM

## 2021-12-26 NOTE — Telephone Encounter (Signed)
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 Cardiology has not been managing pt for this condition/medication. Recommend clearance come from managing provider.

## 2021-12-26 NOTE — Telephone Encounter (Signed)
   Primary Cardiologist: Carlyle Dolly, MD  Chart reviewed as part of pre-operative protocol coverage. Given past medical history and time since last visit, based on ACC/AHA guidelines, [name] would be at acceptable risk for the planned procedure without further cardiovascular testing.   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 Cardiology has not been managing pt for this condition/medication. Recommend clearance come from managing provider.  I will route this recommendation to the requesting party via Epic fax function and remove from pre-op pool.  Please call with questions.  Jossie Ng. Brianna Esson NP-C    12/26/2021, 2:14 PM [address] [place] [address] Office [phone] Fax [phone]

## 2021-12-31 ENCOUNTER — Telehealth: Payer: Self-pay | Admitting: Pulmonary Disease

## 2021-12-31 NOTE — Telephone Encounter (Addendum)
Fax received from Dr. Leighton Ruff with [facility]  to perform a Colectomy on patient.  Patient needs surgery clearance. Patient was seen on 07/05/2021. Patient scheduled for OV on 01/02/2022 with Dr.Icard. Office protocol is a risk assessment can be sent to surgeon if patient has been seen in 60 days or less.   Sending to Dr. Valeta Harms for risk assessment or recommendations if patient needs to be seen in office prior to surgical procedure.

## 2022-01-02 ENCOUNTER — Other Ambulatory Visit (HOSPITAL_COMMUNITY): Payer: BC Managed Care – PPO

## 2022-01-02 ENCOUNTER — Ambulatory Visit (INDEPENDENT_AMBULATORY_CARE_PROVIDER_SITE_OTHER): Payer: BC Managed Care – PPO | Admitting: Pulmonary Disease

## 2022-01-02 ENCOUNTER — Encounter: Payer: Self-pay | Admitting: Pulmonary Disease

## 2022-01-02 VITALS — BP 136/66 | HR 66 | Temp 98.4°F | Ht 74.0 in | Wt 240.0 lb

## 2022-01-02 DIAGNOSIS — Z7901 Long term (current) use of anticoagulants: Secondary | ICD-10-CM

## 2022-01-02 DIAGNOSIS — Z86718 Personal history of other venous thrombosis and embolism: Secondary | ICD-10-CM | POA: Diagnosis not present

## 2022-01-02 DIAGNOSIS — J449 Chronic obstructive pulmonary disease, unspecified: Secondary | ICD-10-CM | POA: Diagnosis not present

## 2022-01-02 DIAGNOSIS — Z86711 Personal history of pulmonary embolism: Secondary | ICD-10-CM | POA: Diagnosis not present

## 2022-01-02 NOTE — Patient Instructions (Signed)
Thank you for visiting Dr. Valeta Harms at [facility]. Today we recommend the following:  Ok to stop eliquis 2 days prior to surgery and restart following procedure Continue trelegy  Ok to procedure with surgery   Return in about 1 year (around 01/03/2023), or if symptoms worsen or fail to improve, for with APP or Dr. Valeta Harms.    Please do your part to reduce the spread of COVID-19.

## 2022-01-02 NOTE — Progress Notes (Signed)
Synopsis: Referred in August 2019 for COPD, RUL pulmonary nodule by [name], MD  Subjective:   PATIENT ID: [name] Needs GENDER: male DOB: 1956/08/04, MRN: [id number]  Chief Complaint  Patient presents with   Follow-up    Initial office visit: Patient has a past medical history of obstructive sleep apnea, prostate cancer as well as tobacco abuse and has been labeled with COPD in the past.  No prior PFTs.  He was recently seen by his PCP with complaints of worsening shortness of breath, 35-pack-year history of smoking, quit smoking 2 years ago, started vaping but recently quit vaping approximately 3 to 4 months ago. He is active outside, plays golf regularly and swims. 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 He gets short winded, SOB and DOE with any exertion. He feels like he has had to slow down due to his symptoms. 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 He has noticed people asking is he is "ok" are you breathing okay situation. He has a had a few episodes where he was seen in the ED. He was at work and couldn't get his breath and he was transported from work.  He has never been intubated or placed on mechanical ventilation.  He has never needed NIPPV for an exacerbation.  OV 05/12/2018: Since our last visit a PET scan was completed for further evaluation of the lung nodule seen on initial LDCT. This revealed low level uptake now with planned follow up. However there was abnormal uptake within the colon which led to a C-scope by GI revealing multiple polyps, tubular adenomas, no cancer detected and colonic diverticulosis. On 10/1 he had a LE Duplex + Right Popliteal DVT and he was started on Xarelto.  This was considered a provoked DVT following a 4-hour flight coming back from [place].  He did state that his breathing was difficult while he was in [place].  He did have difficulty with exertion and climbing out of the [facility] when he was out  with a group sightseeing.  At this point denies any additional chest pain, wheezing or chest tightness.  He has been using his medication regularly.  OV 07/07/2018: He was started on Trelegy after last office visit.  He is here today for follow-up to make sure that he is still doing well with his breathing.  He is very excited to tell me that he is feeling much better.  He has noticed a significant difference in his ability to walk further and get more done at work.  He still been able to play golf on occasion.  He feels like he is less breathless with significant exertion.  He denies any ongoing cough.  He is somewhat anxious about everything that has happened this past year.  After finding the lung nodule that needs to be followed up on CT as well as the PET scan which led to evaluation of the colon and the multiple colon polyps.  He also has follow-up with GI shortly to discuss this again.  He also developed a blood clot and currently on Xarelto.  He also has follow-up with hematology after he completes therapy with his Xarelto.  OV 10/07/2018: increased SOB for the past 3 days. He has feeling for the past few days.  Has noticed nocturnal wheezing.  Has been using his nebulized treatments.  He is fixing to go on a golf trip for the next couple of days.  He has been  using his Trelegy inhaler regularly.  He has noticed predominantly more labored breathing with exertion.  He has not had much change in his sputum production.  He does state that he is finishing his Xarelto therapy on 3/7 to complete 3 months.  He did have a recent viral gastroenteritis about a month ago which required ER visit.  He does feel like if he could breathe a little bit better he would be back to his previous baseline.  We did discuss his prior CT imaging that was completed in November.  He is scheduled for 12-monthlow-dose cancer screening CT follow-up.  OV 06/15/2019: Patient here today for follow-up regarding COPD.  Patient doing very  well today currently managed with Trelegy inhaler plus albuterol.  He rarely uses his albuterol at this time.  He is recently stopped anticoagulation.  Follows with Dr. KDelton Coombeshis hematologist.  His job position was eliminated due to Covid back in June.  In July he underwent a total hip.  Overall doing very well since then.  Has been playing golf several times a week.  He is also trying to swim regularly to help exercise.  At this time has no significant respiratory complaints or change.  Does have some mucus production throughout the day.  And constantly clearing his throat.  He does believe this is stable at this time.  OV 11/01/2020: Here today for follow-up regarding COPD.  Patient follows with Dr. KDelton Coombesfor anticoagulation with history of DVT.  Office note reviewed from March 2022.  Also recently seen by rheumatology for gouty arthritis.  Office note also reviewed from Dr. RBenjamine Mola  Overall from a respiratory standpoint doing well.  Needs refills today of his Trelegy.  He has had some issues with insurance in the past.  Will help him with samples today.  Cost at this point is able for him to afford the medicines.  His right ankle has flared up with his gouty arthritis.  He is leaving after the office visit dedicated to go to the beach.  Recently placed on allopurinol by rheumatology.  OV 05/30/2021: Here today for follow up COPD. Currently on trelegy. He still feels SOB on exertion.  Still using his inhalers as directed.  Unfortunately feels like he is more short of breath.  Lung cancer screening CT this past year with lower lobe peripheral changes concerning for possible early ILD.  We reviewed these today in the office.  OV 07/05/2021: Here today for follow-up after PFTs and HRCT.  He has significant upper lobe centrilobular emphysema.  No significant evidence of fibrosis on CT.  We reviewed his PFTs that were completed yesterday.  He does have a mixed obstructive restrictive defect.  I think the  restrictive defect is likely related to his weight.  He does not have any really significant change from his 2019 PFTs until now.  He still has breathlessness with exertion.  We did [facility]  He does like being on the [place] has not noticed much difference in his symptom management.  OV 01/02/2022: Patient here today for surgical clearance evaluation for colectomy.Patient was [facility] 12[facility]23[facility]2021 [facility].  He had a history of complicated diverticulitis in February 2023 requiring admission to the hospital and antibiotics.  Admitted in January 2023 with bilateral pulmonary embolism.  Patient was last seen by me in December 2022.  He was off anticoagulation developed chest pain shortness of breath  presented to the ER in January diagnosed with bilateral PE started back on Eliquis with recommendations to continue anticoagulation indefinitely.  Following that in February 2023 was admitted to the hospital with complicated diverticulitis.  Ultimately referred for evaluation by general surgery.  They are recommending colectomy and here today for pulmonary clearance.    Past Medical History:  Diagnosis Date   Bilateral shoulder bursitis    Chronic back pain    COPD (chronic obstructive pulmonary disease) with emphysema ([name])    DDD (degenerative disc disease), lumbar    hx epideral injection  L5--S1    Diverticulitis    DVT (deep venous thrombosis) ([place]) 11/19/2018   Dysphasia    intermittant w/ food    Dyspnea    Dyspnea on exertion    GERD (gastroesophageal reflux disease)    Gout    L great toe   Heterozygous factor V Leiden mutation ([place])    Hiatal hernia    Hip problem 2020   left, seeing orthopedics   History of acute pancreatitis    alcoholic pancreatitis [id number] and 01-30-2012. 2021   History of colon polyps    10-17-2005  hyperplastic polyp's   History of esophageal stricture    s/p   dilation 02-02-2017   History of peptic ulcer 1990s   History of pneumococcal pneumonia 2006   bilateral pneumonia w/ left lower lobe abscess treated w/ chest tube with suction for drainage   History of pneumothorax    1984--  left spontaneous pneumothorax ,  treated w/ chest tube   Malignant neoplasm of prostate [facility]) urologist-  dr dahlstedt/  oncologist -- dr Tammi Klippel   dx 12-02-2016-- Stage T2b, Gleason 3+4,  PSA 5.99,  vol 25cc   Numbness of left foot    outer left ankle numb-- per pt has appt. w/ neurologist   OSA (obstructive sleep apnea)    per pt has not used cpap over year ago from 04-16-2017--- per study 01-17-2010  severe osa   Solid nodule of lung greater than 8 mm in diameter 05/11/2018   03/2018: RUL Nodule, 26m   Wears glasses      Family History  Problem Relation Age of Onset   Diabetes Mother    Heart disease Father    Heart disease Brother    Cancer Neg Hx    Colon cancer Neg Hx    Colon polyps Neg Hx    Neuropathy Neg Hx      Social History   Socioeconomic History   Marital status: Married    Spouse name: Not on file   Number of children: 2   Years of education: Not on file   Highest education level: Bachelor's degree (e.g., BA, AB, BS)  Occupational History   Not on file  Tobacco Use   Smoking status: Former    Packs/day: 1.00    Years: 30.00    Pack years: 30.00    Types: Cigarettes, E-cigarettes    Quit date: 10/03/2015    Years since quitting: 6.2    Passive exposure: Past   Smokeless tobacco: Never   Tobacco comments:    stoped vaping 2018  Vaping Use   Vaping Use: Former  Substance and Sexual Activity   Alcohol use: Yes    Alcohol/week: 5.0 standard drinks    Types: 5 Glasses of wine per week   Drug use: No   Sexual activity: Yes    Birth control/protection: None  Other Topics Concern   Not on file  Social History Narrative   Lives at home with his wife   Right handed   Social Determinants of Health   Financial Resource Strain:  Not on file  Food Insecurity: Not on file  Transportation Needs: Not on file  Physical Activity: Not on file  Stress: Not on file  Social Connections: Not on file  Intimate Partner Violence: Not on file     No Known Allergies   Outpatient Medications Prior to Visit  Medication Sig Dispense Refill   acetaminophen (TYLENOL) 500 MG tablet Take 2 tablets (1,000 mg total) by mouth every 6 (six) hours as needed for mild pain. 30 tablet 0   albuterol (PROVENTIL) (2.5 MG/3ML) 0.083% nebulizer solution Take 2.5 mg by nebulization every 6 (six) hours as needed for wheezing or shortness of breath.     albuterol (VENTOLIN HFA) 108 (90 Base) MCG/ACT inhaler Inhale 2 puffs into the lungs every 6 (six) hours as needed for wheezing or shortness of breath.     allopurinol (ZYLOPRIM) 300 MG tablet Take 300 mg by mouth daily.     Fluticasone-Umeclidin-Vilant (TRELEGY ELLIPTA) 100-62.5-25 MCG/ACT AEPB Inhale 1 puff into the lungs daily. 60 each 5   metoprolol succinate (TOPROL XL) 25 MG 24 hr tablet Take 1 tablet (25 mg total) by mouth daily. 90 tablet 3   apixaban (ELIQUIS) 5 MG TABS tablet Take 1 tablet (5 mg total) by mouth 2 (two) times daily. 60 tablet 3   rosuvastatin (CRESTOR) 20 MG tablet Take 1 tablet (20 mg total) by mouth daily. 90 tablet 3   No facility-administered medications prior to visit.    Review of Systems  Constitutional:  Negative for chills, fever, malaise/fatigue and weight loss.  HENT:  Negative for hearing loss, sore throat and tinnitus.   Eyes:  Negative for blurred vision and double vision.  Respiratory:  Negative for cough, hemoptysis, sputum production, shortness of breath, wheezing and stridor.   Cardiovascular:  Negative for chest pain, palpitations, orthopnea, leg swelling and PND.  Gastrointestinal:  Negative for abdominal pain, constipation, diarrhea, heartburn, nausea and vomiting.  Genitourinary:  Negative for dysuria, hematuria and urgency.  Musculoskeletal:   Negative for joint pain and myalgias.  Skin:  Negative for itching and rash.  Neurological:  Negative for dizziness, tingling, weakness and headaches.  Endo/Heme/Allergies:  Negative for environmental allergies. Does not bruise/bleed easily.  Psychiatric/Behavioral:  Negative for depression. The patient is not nervous/anxious and does not have insomnia.   All other systems reviewed and are negative.   Objective:  Physical Exam Vitals reviewed.  Constitutional:      General: He is not in acute distress.    Appearance: He is well-developed. He is obese.  HENT:     Head: Normocephalic and atraumatic.  Eyes:     General: No scleral icterus.    Conjunctiva/sclera: Conjunctivae normal.     Pupils: Pupils are equal, round, and reactive to light.  Neck:     Vascular: No JVD.     Trachea: No tracheal deviation.  Cardiovascular:     Rate and Rhythm: Normal rate and regular rhythm.     Heart sounds: Normal heart sounds. No murmur heard. Pulmonary:     Effort: Pulmonary effort is normal. No tachypnea, accessory muscle usage or respiratory distress.     Breath sounds: No stridor. No wheezing, rhonchi or rales.  Abdominal:     General: There is no distension.     Palpations: Abdomen is soft.     Tenderness: There  is no abdominal tenderness.  Musculoskeletal:        General: No tenderness.     Cervical back: Neck supple.  Lymphadenopathy:     Cervical: No cervical adenopathy.  Skin:    General: Skin is warm and dry.     Capillary Refill: Capillary refill takes less than 2 seconds.     Findings: No rash.  Neurological:     Mental Status: He is alert and oriented to person, place, and time.  Psychiatric:        Behavior: Behavior normal.    Vitals:   01/02/22 1156  BP: 136/66  Pulse: 66  Temp: 98.4 F (36.9 C)  TempSrc: Oral  SpO2: 95%  Weight: 240 lb (108.9 kg)  Height: '6\' 2"'$  (1.88 m)   95% on RA BMI Readings from Last 3 Encounters:  01/02/22 30.81 kg/m  11/15/21 31.53  kg/m  10/22/21 31.08 kg/m   Wt Readings from Last 3 Encounters:  01/02/22 240 lb (108.9 kg)  11/15/21 239 lb (108.4 kg)  10/22/21 235 lb 9.6 oz (106.9 kg)     CBC    Component Value Date/Time   WBC 8.8 12/18/2021 1623   RBC 4.30 12/18/2021 1623   HGB 14.4 12/18/2021 1623   HGB 15.6 07/30/2021 1342   HCT 41.8 12/18/2021 1623   HCT 44.8 07/30/2021 1342   PLT 157 12/18/2021 1623   PLT 162 07/30/2021 1342   MCV 97.2 12/18/2021 1623   MCV 100 (H) 07/30/2021 1342   MCH 33.5 12/18/2021 1623   MCHC 34.4 12/18/2021 1623   RDW 13.2 12/18/2021 1623   RDW 12.7 07/30/2021 1342   LYMPHSABS 2.3 09/24/2021 0456   MONOABS 0.7 09/24/2021 0456   EOSABS 0.3 09/24/2021 0456   BASOSABS 0.1 09/24/2021 0456     Chest Imaging: LDCT 03/2018 - RUL Lung nodule, approximately 26m with areas of surrounding scar, significant BL upper lobe predominant emphysema The patient's images have been independently reviewed by me.    PET Scan: 03/23/2018 Mild uptake in the RUL density. Extensive colonic uptake from PET scan  07/20/2018: CT chest  Lung RADS 2, three-vessel coronary disease, stable right upper lobe pulmonary nodule. The patient's images have been independently reviewed by me.    December 2020: CT chest lung cancer screening pending  Vascular duplex 09/25/2020: Right lower extremity short segment popliteal DVT.  Lung cancer screening CT 08/03/2019: Lung RADS 2S.  Evidence of centrilobular and paraseptal emphysema. Three-vessel coronary disease.  No concerning nodules. The patient's images have been independently reviewed by me.    2022 lung cancer screening CT: Bilateral lower lobe areas of interstitial markings, possible NSIP. The patient's images have been independently reviewed by me.    CT chest 06/09/2021: No obvious sign of interstitial lung disease, moderate to severe centrilobular emphysema three-vessel coronary disease. The patient's images have been independently reviewed by  me.     Pulmonary Functions Testing Results:    Latest Ref Rng & Units 07/04/2021    3:47 PM 03/22/2018    3:32 PM  PFT Results  FVC-Pre L 3.57   3.30    FVC-Predicted Pre % 69   63    FVC-Post L 3.51   3.40    FVC-Predicted Post % 68   65    Pre FEV1/FVC % % 67   64    Post FEV1/FCV % % 74   69    FEV1-Pre L 2.41   2.12    FEV1-Predicted Pre % 62  54    FEV1-Post L 2.58   2.36    DLCO uncorrected ml/min/mmHg 19.86   18.65    DLCO UNC% % 67   52    DLCO corrected ml/min/mmHg 19.86     DLCO COR %Predicted % 67     DLVA Predicted % 87   69    TLC L 5.91   4.69    TLC % Predicted % 77   62    RV % Predicted % 89   38      FeNO: None  Pathology: None   Echocardiogram: None   Heart Catheterization: None     Assessment & Plan:   Moderate COPD (chronic obstructive pulmonary disease) ([name])  On continuous oral anticoagulation  History of deep venous thrombosis  History of pulmonary embolism   Discussion:  This is a 66 year old gentleman moderate COPD on triple therapy inhaler with Trelegy.  PFTs in 2019.  HRCT with no evidence of ILD.  History of DVT in the past PE in the past, was on anticoagulation.  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  Plan: Has plans for colectomy with Dr. Marcello Moores. I think he will do fine with surgery. I think he is low to moderate risk for perioperative pulmonary complication. He is okay to stop his anticoagulation 2 days prior to surgery and restart after surgery complete. Hopefully he could restart anticoagulation postop day 1 if possible.  Patient to follow-up with [place] for COPD management in 1 year or as needed.   Current Outpatient Medications:    acetaminophen (TYLENOL) 500 MG tablet, Take 2 tablets (1,000 mg total) by mouth every 6 (six) hours as needed for mild pain., Disp: 30 tablet, Rfl: 0   albuterol (PROVENTIL) (2.5 MG/3ML) 0.083% nebulizer solution, Take 2.5 mg by nebulization every 6 (six) hours as needed for  wheezing or shortness of breath., Disp: , Rfl:    albuterol (VENTOLIN HFA) 108 (90 Base) MCG/ACT inhaler, Inhale 2 puffs into the lungs every 6 (six) hours as needed for wheezing or shortness of breath., Disp: , Rfl:    allopurinol (ZYLOPRIM) 300 MG tablet, Take 300 mg by mouth daily., Disp: , Rfl:    Fluticasone-Umeclidin-Vilant (TRELEGY ELLIPTA) 100-62.5-25 MCG/ACT AEPB, Inhale 1 puff into the lungs daily., Disp: 60 each, Rfl: 5   metoprolol succinate (TOPROL XL) 25 MG 24 hr tablet, Take 1 tablet (25 mg total) by mouth daily., Disp: 90 tablet, Rfl: 3   apixaban (ELIQUIS) 5 MG TABS tablet, Take 1 tablet (5 mg total) by mouth 2 (two) times daily., Disp: 60 tablet, Rfl: 3   rosuvastatin (CRESTOR) 20 MG tablet, Take 1 tablet (20 mg total) by mouth daily., Disp: 90 tablet, Rfl: 3   Garner Nash, DO [facility] 01/02/2022 12:31 PM

## 2022-01-06 ENCOUNTER — Ambulatory Visit (HOSPITAL_COMMUNITY)
Admission: RE | Admit: 2022-01-06 | Discharge: 2022-01-06 | Disposition: A | Discharge status: Home / Self Care | Payer: BC Managed Care – PPO | Source: Ambulatory Visit | Attending: Family Medicine | Admitting: Family Medicine

## 2022-01-06 DIAGNOSIS — M48061 Spinal stenosis, lumbar region without neurogenic claudication: Secondary | ICD-10-CM | POA: Insufficient documentation

## 2022-01-06 DIAGNOSIS — M545 Low back pain, unspecified: Secondary | ICD-10-CM | POA: Diagnosis not present

## 2022-01-07 ENCOUNTER — Other Ambulatory Visit: Payer: Self-pay | Admitting: Urology

## 2022-01-09 ENCOUNTER — Ambulatory Visit (HOSPITAL_COMMUNITY): Payer: BC Managed Care – PPO | Admitting: Physician Assistant

## 2022-01-16 ENCOUNTER — Telehealth: Payer: Self-pay | Admitting: Pulmonary Disease

## 2022-01-16 DIAGNOSIS — J449 Chronic obstructive pulmonary disease, unspecified: Secondary | ICD-10-CM

## 2022-01-16 MED ORDER — TRELEGY ELLIPTA 100-62.5-25 MCG/ACT IN AEPB: 1.0000 | INHALATION_SPRAY | Freq: Every day | RESPIRATORY_TRACT | 5 refills | Status: DC

## 2022-01-16 NOTE — Telephone Encounter (Signed)
Refill sent.  Nothing further needed  

## 2022-01-17 ENCOUNTER — Other Ambulatory Visit (HOSPITAL_COMMUNITY)
Admission: RE | Admit: 2022-01-17 | Discharge: 2022-01-17 | Disposition: A | Discharge status: Home / Self Care | Payer: BC Managed Care – PPO | Source: Ambulatory Visit | Attending: Family Medicine | Admitting: Family Medicine

## 2022-01-17 DIAGNOSIS — Z7901 Long term (current) use of anticoagulants: Secondary | ICD-10-CM | POA: Diagnosis present

## 2022-01-17 LAB — CBC
HCT: 42.6 % (ref 39.0–52.0)
Hemoglobin: 14.3 g/dL (ref 13.0–17.0)
MCH: 33.5 pg (ref 26.0–34.0)
MCHC: 33.6 g/dL (ref 30.0–36.0)
MCV: 99.8 fL (ref 80.0–100.0)
Platelets: 159 10*3/uL (ref 150–400)
RBC: 4.27 MIL/uL (ref 4.22–5.81)
RDW: 14 % (ref 11.5–15.5)
WBC: 7 10*3/uL (ref 4.0–10.5)
nRBC: 0 % (ref 0.0–0.2)

## 2022-01-17 LAB — COMPREHENSIVE METABOLIC PANEL
ALT: 49 U/L — ABNORMAL HIGH (ref 0–44)
AST: 68 U/L — ABNORMAL HIGH (ref 15–41)
Albumin: 3.6 g/dL (ref 3.5–5.0)
Alkaline Phosphatase: 65 U/L (ref 38–126)
Anion gap: 8 (ref 5–15)
BUN: 12 mg/dL (ref 8–23)
CO2: 25 mmol/L (ref 22–32)
Calcium: 9.1 mg/dL (ref 8.9–10.3)
Chloride: 108 mmol/L (ref 98–111)
Creatinine, Ser: 0.74 mg/dL (ref 0.61–1.24)
GFR, Estimated: >60 mL/min (ref >60)
Glucose, Bld: 120 mg/dL — ABNORMAL HIGH (ref 70–99)
Potassium: 3.9 mmol/L (ref 3.5–5.1)
Sodium: 141 mmol/L (ref 135–145)
Total Bilirubin: 0.6 mg/dL (ref 0.3–1.2)
Total Protein: 6.7 g/dL (ref 6.5–8.1)

## 2022-01-17 LAB — C-REACTIVE PROTEIN: CRP: 1.9 mg/dL — ABNORMAL HIGH (ref <1.0)

## 2022-01-17 LAB — SEDIMENTATION RATE: Sed Rate: 23 mm/hr — ABNORMAL HIGH (ref 0–16)

## 2022-01-20 ENCOUNTER — Inpatient Hospital Stay (HOSPITAL_COMMUNITY): Payer: BC Managed Care – PPO | Attending: Hematology

## 2022-01-20 DIAGNOSIS — R1032 Left lower quadrant pain: Secondary | ICD-10-CM | POA: Insufficient documentation

## 2022-01-20 DIAGNOSIS — Z79899 Other long term (current) drug therapy: Secondary | ICD-10-CM | POA: Diagnosis not present

## 2022-01-20 DIAGNOSIS — D6851 Activated protein C resistance: Secondary | ICD-10-CM | POA: Insufficient documentation

## 2022-01-20 DIAGNOSIS — Z7901 Long term (current) use of anticoagulants: Secondary | ICD-10-CM | POA: Diagnosis not present

## 2022-01-20 DIAGNOSIS — Z8546 Personal history of malignant neoplasm of prostate: Secondary | ICD-10-CM | POA: Diagnosis not present

## 2022-01-20 DIAGNOSIS — Z86718 Personal history of other venous thrombosis and embolism: Secondary | ICD-10-CM | POA: Insufficient documentation

## 2022-01-20 DIAGNOSIS — R1031 Right lower quadrant pain: Secondary | ICD-10-CM | POA: Insufficient documentation

## 2022-01-20 DIAGNOSIS — I82431 Acute embolism and thrombosis of right popliteal vein: Secondary | ICD-10-CM

## 2022-01-20 DIAGNOSIS — Z86711 Personal history of pulmonary embolism: Secondary | ICD-10-CM | POA: Diagnosis present

## 2022-01-20 DIAGNOSIS — Z87891 Personal history of nicotine dependence: Secondary | ICD-10-CM | POA: Diagnosis not present

## 2022-01-20 DIAGNOSIS — I2694 Multiple subsegmental pulmonary emboli without acute cor pulmonale: Secondary | ICD-10-CM

## 2022-01-20 LAB — CBC WITH DIFFERENTIAL/PLATELET
Abs Immature Granulocytes: 0.05 10*3/uL (ref 0.00–0.07)
Basophils Absolute: 0 10*3/uL (ref 0.0–0.1)
Basophils Relative: 1 %
Eosinophils Absolute: 0.2 10*3/uL (ref 0.0–0.5)
Eosinophils Relative: 2 %
HCT: 42 % (ref 39.0–52.0)
Hemoglobin: 14.5 g/dL (ref 13.0–17.0)
Immature Granulocytes: 1 %
Lymphocytes Relative: 34 %
Lymphs Abs: 2.4 10*3/uL (ref 0.7–4.0)
MCH: 34.1 pg — ABNORMAL HIGH (ref 26.0–34.0)
MCHC: 34.5 g/dL (ref 30.0–36.0)
MCV: 98.8 fL (ref 80.0–100.0)
Monocytes Absolute: 0.7 10*3/uL (ref 0.1–1.0)
Monocytes Relative: 9 %
Neutro Abs: 3.8 10*3/uL (ref 1.7–7.7)
Neutrophils Relative %: 53 %
Platelets: 156 10*3/uL (ref 150–400)
RBC: 4.25 MIL/uL (ref 4.22–5.81)
RDW: 13.6 % (ref 11.5–15.5)
WBC: 7.1 10*3/uL (ref 4.0–10.5)
nRBC: 0 % (ref 0.0–0.2)

## 2022-01-20 LAB — D-DIMER, QUANTITATIVE: D-Dimer, Quant: <0.27 ug/mL-FEU (ref 0.00–0.50)

## 2022-01-20 NOTE — Telephone Encounter (Signed)
OV notes and clearance form have been faxed back to [facility]. Nothing further needed at this time.  

## 2022-01-20 NOTE — Progress Notes (Unsigned)
[address] [place], [place] [address]   CLINIC:  Medical Oncology/Hematology  PCP:  [name], MD [place] [address] [phone]   REASON FOR VISIT:  Follow-up for recurrent pulmonary embolism  PRIOR THERAPY: Xarelto (10/02/2020 through 02/01/2021)  CURRENT THERAPY: Eliquis  INTERVAL HISTORY:  [name] 66 y.o. 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  He was last seen by Tarri Abernethy PA-C on 09/05/2021.  Since his last office visit, he was hospitalized from 09/23/2021 through 09/26/2021 for acute diverticulitis.  He apparently has upcoming surgery (02/12/2022) for prophylactic partial colectomy due to recurrent diverticulitis. At today's visit, he reports bilateral groin pain, left greater than right.  This has been extensively worked up by his PCP, orthopedist, and urologist, but without any definitive etiology identified.  Regarding his history of recurrent pulmonary embolism, he reports that his respiratory status is back to normal.  He denies any shortness of breath, dyspnea on exertion, or cough.  No palpitations.  He denies what he would call "chest pain," but reports that he has cramping aches all over his body that will sometimes occur in the middle of his chest; he is being followed by cardiology.  He has some residual bilateral leg swelling, left slightly greater than right.  He is tolerating Eliquis well, and denies any bleeding events such as epistaxis, hematemesis, hematochezia, or melena.  He does report some easy bruising.  He has 75% energy and 100% appetite. He endorses that he is maintaining a stable weight.   REVIEW OF SYSTEMS:    Review of Systems  Constitutional:  Positive for fatigue (Energy 75%). Negative for appetite change, chills, diaphoresis, fever and unexpected weight change.  HENT:   Negative for lump/mass and nosebleeds.   Eyes:  Negative for eye problems.  Respiratory:  Negative  for cough, hemoptysis and shortness of breath.   Cardiovascular:  Positive for chest pain (None today) and leg swelling. Negative for palpitations.  Gastrointestinal:  Negative for abdominal pain, blood in stool, constipation, diarrhea, nausea and vomiting.  Genitourinary:  Negative for hematuria.   Skin: Negative.   Neurological:  Negative for dizziness, headaches and light-headedness.  Hematological:  Does not bruise/bleed easily.      PAST MEDICAL/SURGICAL HISTORY:  Past Medical History:  Diagnosis Date   Bilateral shoulder bursitis    Chronic back pain    COPD (chronic obstructive pulmonary disease) with emphysema ([name])    DDD (degenerative disc disease), lumbar    hx epideral injection  L5--S1    Diverticulitis    DVT (deep venous thrombosis) ([facility]) 11/19/2018   Dysphasia    intermittant w/ food    Dyspnea    Dyspnea on exertion    GERD (gastroesophageal reflux disease)    Gout    L great toe   Heterozygous factor V Leiden mutation ([place])    Hiatal hernia    Hip problem 2020   left, seeing orthopedics   History of acute pancreatitis    alcoholic pancreatitis [id number] and 01-30-2012. 2021   History of colon polyps    10-17-2005  hyperplastic polyp's   History of esophageal stricture    s/p  dilation 02-02-2017   History of peptic ulcer 1990s   History of pneumococcal pneumonia 2006   bilateral pneumonia w/ left lower lobe abscess treated w/ chest tube with suction for drainage   History of pneumothorax    1984--  left spontaneous pneumothorax ,  treated  w/ chest tube   Malignant neoplasm of prostate [facility]) urologist-  dr dahlstedt/  oncologist -- dr Tammi Klippel   dx 12-02-2016-- Stage T2b, Gleason 3+4,  PSA 5.99,  vol 25cc   Numbness of left foot    outer left ankle numb-- per pt has appt. w/ neurologist   OSA (obstructive sleep apnea)    per pt has not used cpap over year ago from 04-16-2017--- per study 01-17-2010  severe osa   Solid nodule of lung greater than 8 mm  in diameter 05/11/2018   03/2018: RUL Nodule, 27m   Wears glasses    Past Surgical History:  Procedure Laterality Date   BIOPSY  02/02/2017   Procedure: BIOPSY;  Surgeon: RDaneil Dolin MD;  [facility];  Service: Endoscopy;;  duodenal bx's, esophgeal bx's   BIOPSY  07/30/2017   Procedure: BIOPSY;  Surgeon: RDaneil Dolin MD;  [facility];  Service: Endoscopy;;  esophagus   BIOPSY  02/15/2021   Procedure: BIOPSY;  Surgeon: RDaneil Dolin MD;  [facility];  Service: Endoscopy;;   CARDIOVASCULAR STRESS TEST  09/12/2009   normal nuclear perfusion study w/ no ischemia /  normal LV function and wall motion , ef 57%   COLONOSCOPY  2007   Dr. RGala Romney hyperplastic polyps, internal hemorrhoids    COLONOSCOPY WITH PROPOFOL N/A 05/03/2018   Dr. RGala Romney diverticulosis in entire colon, multiple polyps in sigmoid, at splenic flexure, and ascending colon, not all polyps removed. Tubular adenomas and inflammatory polyps. Needs 3 year surveilance   COLONOSCOPY WITH PROPOFOL N/A 02/15/2021   Many polyps without adenomatous changes but all inflammatory in nature with mildly active colitis and chronic inflammatory mucosa.   EPIDURAL BLOCK INJECTION  2020   ESOPHAGOGASTRODUODENOSCOPY (EGD) WITH PROPOFOL N/A 02/02/2017   Dr. RGala Romney esophageal stenosis s/p dilation and biopsy, medium sized hiatal hernia, normal duodenum   ESOPHAGOGASTRODUODENOSCOPY (EGD) WITH PROPOFOL N/A 07/30/2017   Dr. RGala Romney esophageal stenosis s/p dilation and biopsy, medium-sized hiatal hernia, normal duodenum   [facility]  INTRAVASCULAR PRESSURE WIRE/FFR STUDY N/A 08/01/2021   Procedure: INTRAVASCULAR PRESSURE WIRE/FFR STUDY;  Surgeon: MBurnell Blanks MD;  [facility];  Service: Cardiovascular;  Laterality: N/A;   IR RADIOLOGIST EVAL & MGMT  08/29/2020   LEFT HEART CATH AND CORONARY ANGIOGRAPHY N/A 08/01/2021   Procedure: LEFT HEART  CATH AND CORONARY ANGIOGRAPHY;  Surgeon: MBurnell Blanks MD;  [place] [facility];  Service: Cardiovascular;  Laterality: N/A;   MALONEY DILATION N/A 02/02/2017   Procedure: MVenia MinksDILATION;  Surgeon: RDaneil Dolin MD;  [facility];  Service: Endoscopy;  Laterality: N/A;   MALONEY DILATION N/A 07/30/2017   Procedure: MVenia MinksDILATION;  Surgeon: RDaneil Dolin MD;  [facility];  Service: Endoscopy;  Laterality: N/A;   POLYPECTOMY  05/03/2018   Procedure: POLYPECTOMY;  Surgeon: RDaneil Dolin MD;  [facility];  Service: Endoscopy;;  ascending colon polyp, sigmoid colon polyps (multiple)   POLYPECTOMY  02/15/2021   Procedure: POLYPECTOMY;  Surgeon: RDaneil Dolin MD;  [facility];  Service: Endoscopy;;   RADIOACTIVE SEED IMPLANT N/A 04/23/2017   Procedure: RADIOACTIVE SEED IMPLANT/BRACHYTHERAPY IMPLANT;  Surgeon: DFranchot Gallo MD;  [facility]  Service: Urology;  Laterality: N/A;   SHOULDER ARTHROSCOPY  08/05/2003   SPACE OAR INSTILLATION N/A 04/23/2017   Procedure: SPACE OAR INSTILLATION;  Surgeon: Franchot Gallo, MD;  Location: [facility];  Service: Urology;  Laterality: N/A;   TRANSTHORACIC ECHOCARDIOGRAM  03/28/2009   mild LVH, ef 55-60%/ mild aorta calcification   VASECTOMY  08/05/1983   VIDEO ASSISTED THORACOSCOPY (VATS)/DECORTICATION Left      SOCIAL HISTORY:  Social History   Socioeconomic History   Marital status: Married    Spouse name: Not on file   Number of children: 2   Years of education: Not on file   Highest education level: Bachelor's degree (e.g., BA, AB, BS)  Occupational History   Not on file  Tobacco Use   Smoking status: Former    Packs/day: 1.00    Years: 30.00    Total pack years: 30.00    Types: Cigarettes, E-cigarettes    Quit date: 10/03/2015    Years since quitting: 6.3    Passive exposure: Past   Smokeless tobacco: Never    Tobacco comments:    stoped vaping 2018  Vaping Use   Vaping Use: Former  Substance and Sexual Activity   Alcohol use: Yes    Alcohol/week: 5.0 standard drinks of alcohol    Types: 5 Glasses of wine per week   Drug use: No   Sexual activity: Yes    Birth control/protection: None  Other Topics Concern   Not on file  Social History Narrative   Lives at home with his wife   Right handed   Social Determinants of Health   Financial Resource Strain: Not on file  Food Insecurity: Not on file  Transportation Needs: Not on file  Physical Activity: Not on file  Stress: Not on file  Social Connections: Not on file  Intimate Partner Violence: Not on file    FAMILY HISTORY:  Family History  Problem Relation Age of Onset   Diabetes Mother    Heart disease Father    Heart disease Brother    Cancer Neg Hx    Colon cancer Neg Hx    Colon polyps Neg Hx    Neuropathy Neg Hx     CURRENT MEDICATIONS:  Outpatient Encounter Medications as of 01/21/2022  Medication Sig   acetaminophen (TYLENOL) 500 MG tablet Take 2 tablets (1,000 mg total) by mouth every 6 (six) hours as needed for mild pain.   albuterol (PROVENTIL) (2.5 MG/3ML) 0.083% nebulizer solution Take 2.5 mg by nebulization every 6 (six) hours as needed for wheezing or shortness of breath.   albuterol (VENTOLIN HFA) 108 (90 Base) MCG/ACT inhaler Inhale 2 puffs into the lungs every 6 (six) hours as needed for wheezing or shortness of breath.   allopurinol (ZYLOPRIM) 300 MG tablet Take 300 mg by mouth daily.   apixaban (ELIQUIS) 5 MG TABS tablet Take 1 tablet (5 mg total) by mouth 2 (two) times daily.   Fluticasone-Umeclidin-Vilant (TRELEGY ELLIPTA) 100-62.5-25 MCG/ACT AEPB Inhale 1 puff into the lungs daily.   metoprolol succinate (TOPROL XL) 25 MG 24 hr tablet Take 1 tablet (25 mg total) by mouth daily.   rosuvastatin (CRESTOR) 20 MG tablet Take 1 tablet (20 mg total) by mouth daily.   No facility-administered encounter medications  on file as of 01/21/2022.    ALLERGIES:  No Known Allergies   PHYSICAL EXAM:  ECOG PERFORMANCE STATUS: 1 - Symptomatic but completely ambulatory    There were no vitals filed for this visit. There were no vitals filed for this visit. Physical Exam Constitutional:      Appearance: Normal appearance. He is obese.  HENT:  Head: Normocephalic and atraumatic.     Mouth/Throat:     Mouth: Mucous membranes are moist.  Eyes:     Extraocular Movements: Extraocular movements intact.     Pupils: Pupils are equal, round, and reactive to light.  Cardiovascular:     Rate and Rhythm: Normal rate and regular rhythm.     Pulses: Normal pulses.     Heart sounds: Normal heart sounds.  Pulmonary:     Effort: Pulmonary effort is normal.     Breath sounds: Normal breath sounds.  Abdominal:     General: Bowel sounds are normal.     Palpations: Abdomen is soft.     Tenderness: There is no abdominal tenderness.  Musculoskeletal:        General: No swelling.     Right lower leg: Edema (trace) present.     Left lower leg: Edema (1+ pretibial pitting edema) present.  Lymphadenopathy:     Cervical: No cervical adenopathy.  Skin:    General: Skin is warm and dry.     Comments: Dusky red hyperpigmentation of distal bilateral lower extremities  Neurological:     General: No focal deficit present.     Mental Status: He is alert and oriented to person, place, and time.  Psychiatric:        Mood and Affect: Mood normal.        Behavior: Behavior normal.      LABORATORY DATA:  I have reviewed the labs as listed.  CBC    Component Value Date/Time   WBC 7.1 01/20/2022 0911   RBC 4.25 01/20/2022 0911   HGB 14.5 01/20/2022 0911   HGB 15.6 07/30/2021 1342   HCT 42.0 01/20/2022 0911   HCT 44.8 07/30/2021 1342   PLT 156 01/20/2022 0911   PLT 162 07/30/2021 1342   MCV 98.8 01/20/2022 0911   MCV 100 (H) 07/30/2021 1342   MCH 34.1 (H) 01/20/2022 0911   MCHC 34.5 01/20/2022 0911   RDW 13.6  01/20/2022 0911   RDW 12.7 07/30/2021 1342   LYMPHSABS 2.4 01/20/2022 0911   MONOABS 0.7 01/20/2022 0911   EOSABS 0.2 01/20/2022 0911   BASOSABS 0.0 01/20/2022 0911      Latest Ref Rng & Units 01/17/2022   10:20 AM 12/18/2021    4:23 PM 10/24/2021    4:58 PM  CMP  Glucose 70 - 99 mg/dL 120  123    BUN 8 - 23 mg/dL 12  6    Creatinine 0.61 - 1.24 mg/dL 0.74  0.58  0.80   Sodium 135 - 145 mmol/L 141  140    Potassium 3.5 - 5.1 mmol/L 3.9  3.5    Chloride 98 - 111 mmol/L 108  105    CO2 22 - 32 mmol/L 25  25    Calcium 8.9 - 10.3 mg/dL 9.1  9.0    Total Protein 6.5 - 8.1 g/dL 6.7  6.8    Total Bilirubin 0.3 - 1.2 mg/dL 0.6  0.7    Alkaline Phos 38 - 126 U/L 65  56    AST 15 - 41 U/L 68  45    ALT 0 - 44 U/L 49  37      DIAGNOSTIC IMAGING:  I have independently reviewed the relevant imaging and discussed with the patient.  ASSESSMENT & PLAN: 1.  Recurrent PE and DVT, factor V Leiden heterozygosity - Previously evaluated by hematology clinic for recurrent DVTs (October 2019 and January 2022), which were both  felt to be a provoked - Completed Xarelto x4 months (10/02/2020 through 02/01/2021) - Venous duplex ultrasound (01/23/2021) showed resolution of right lower extremity DVT without any residual thrombus - Hematology work-up showed factor V Leiden heterozygosity, but since both of his prior DVTs were provoked, no lifelong anticoagulation was initially recommended. - Patient presented to hospital on 08/16/2021 with complaints of progressive dyspnea x1 month, was found to have acute hypoxemic respiratory failure in the setting of a bilateral PE, but with no signs of right ventricular strain on CT scan.  Ultrasound of lower extremities showed bilateral DVTs.  He was started on IV heparin drip while hospitalized, and transition to Eliquis at discharge.  At the time of discharge, his symptoms had improved and he no longer required off was on supplementation. - He is tolerating Eliquis well  without any major bleeding events.   - No symptoms of recurrent DVT or PE at this time. - Most recent labs (01/20/2022) show normal D-dimer <0.27.  Normal Hgb 14.5. - PLAN: Given his recurrent bilateral PE and bilateral DVTs in January 2023, recommend indefinite anticoagulation. -For upcoming surgery, recommend that he hold Eliquis for 48 hours prior to surgery, and resume ASAP after surgery with surgeons approval.   - We have discussed risks and benefits of anticoagulation in depth, and patient is agreeable to proceeding. - RTC in 1 year for reassessment and ongoing evaluation of risks and benefits of chronic anticoagulation.   - If he has persistent or worsening symptoms at follow-up visit, would consider repeat imaging  2.  Prostate cancer: -TRUS/biopsy on 12/02/2016.  4 mm left apical nodule, PSA 5.99.  6/12 cores positive for adenocarcinoma, all on the left side of the prostate.  3 cores revealed GS 3+3, 3 cores with GS 3+4.  He underwent I-125 brachytherapy on 04/23/2017. - Latest PSA on 12/10/2021 was < 0.1 - PLAN: Continue follow-up with Dr. Diona Fanti   PLAN SUMMARY & DISPOSITION: Same-day labs and RTC in 1 year   All questions were answered. The patient knows to call the clinic with any problems, questions or concerns.  Medical decision making: Low  Time spent on visit: I spent 17 minutes counseling the patient face to face. The total time spent in the appointment was 23 minutes and more than 50% was on counseling.   Harriett Rush, PA-C  01/21/2022 11:36 PM

## 2022-01-21 ENCOUNTER — Inpatient Hospital Stay (HOSPITAL_BASED_OUTPATIENT_CLINIC_OR_DEPARTMENT_OTHER): Payer: BC Managed Care – PPO | Admitting: Physician Assistant

## 2022-01-21 ENCOUNTER — Other Ambulatory Visit: Payer: 59

## 2022-01-21 VITALS — BP 144/89 | HR 64 | Temp 97.0°F | Resp 18 | Ht 74.0 in | Wt 234.8 lb

## 2022-01-21 DIAGNOSIS — I2694 Multiple subsegmental pulmonary emboli without acute cor pulmonale: Secondary | ICD-10-CM | POA: Diagnosis not present

## 2022-01-21 DIAGNOSIS — Z86718 Personal history of other venous thrombosis and embolism: Secondary | ICD-10-CM | POA: Diagnosis not present

## 2022-01-21 DIAGNOSIS — I82431 Acute embolism and thrombosis of right popliteal vein: Secondary | ICD-10-CM | POA: Diagnosis not present

## 2022-01-21 DIAGNOSIS — D6851 Activated protein C resistance: Secondary | ICD-10-CM

## 2022-01-21 NOTE — Patient Instructions (Signed)
[place] at [facility] Discharge Instructions  You were seen today by Tarri Abernethy PA-C for your blood clots.  You will need to stay on lifelong blood thinners for the foreseeable future.    If you have any symptoms of a blood clot coming back - or any major bleeding episodes - seek IMMEDIATE medical attention!  MEDICATIONS: Eliquis 5 mg BID  FOLLOW-UP APPOINTMENT: Labs and office visit in 1 year   Thank you for choosing [place] at [facility] to provide your oncology and hematology care.  To afford each patient quality time with our provider, please arrive at least 15 minutes before your scheduled appointment time.   If you have a lab appointment with the [facility] please come in thru the Main Entrance and check in at the main information desk.  You need to re-schedule your appointment should you arrive 10 or more minutes late.  We strive to give you quality time with our providers, and arriving late affects you and other patients whose appointments are after yours.  Also, if you no show three or more times for appointments you may be dismissed from the clinic at the providers discretion.     Again, thank you for choosing [facility].  Our hope is that these requests will decrease the amount of time that you wait before being seen by our physicians.       _____________________________________________________________  Should you have questions after your visit to [facility], please contact our office at [phone] and follow the prompts.  Our office hours are 8:00 a.m. and 4:30 p.m. Monday - Friday.  Please note that voicemails left after 4:00 p.m. may not be returned until the following business day.  We are closed weekends and major holidays.  You do have access to a nurse 24-7, just call the main number to the clinic [phone] and do not press any options, hold on the line and a nurse will answer  the phone.    For prescription refill requests, have your pharmacy contact our office and allow 72 hours.    Due to Covid, you will need to wear a mask upon entering the hospital. If you do not have a mask, a mask will be given to you at the Main Entrance upon arrival. For doctor visits, patients may have 1 support person age 65 or older with them. For treatment visits, patients can not have anyone with them due to social distancing guidelines and our immunocompromised population.

## 2022-01-28 ENCOUNTER — Ambulatory Visit: Payer: 59 | Admitting: Urology

## 2022-01-29 ENCOUNTER — Other Ambulatory Visit (HOSPITAL_COMMUNITY): Payer: Self-pay

## 2022-02-05 NOTE — Patient Instructions (Signed)
DUE TO COVID-19 ONLY TWO VISITORS  (aged 65 and older)  ARE ALLOWED TO COME WITH YOU AND STAY IN THE WAITING ROOM ONLY DURING PRE OP AND PROCEDURE.   **NO VISITORS ARE ALLOWED IN THE SHORT STAY AREA OR RECOVERY ROOM!!**  IF YOU WILL BE ADMITTED INTO THE HOSPITAL YOU ARE ALLOWED ONLY FOUR SUPPORT PEOPLE DURING VISITATION HOURS ONLY (7 AM -8PM)   The support person(s) must pass our screening, gel in and out, and wear a mask at all times, including in the patient's room. Patients must also wear a mask when staff or their support person are in the room. Visitors GUEST BADGE MUST BE WORN VISIBLY  One adult visitor may remain with you overnight and MUST be in the room by 8 P.M.     Your procedure is scheduled on: 02/12/22   Report to [facility] Main Entrance    Report to admitting at 9:45 AM   Call this number if you have problems the morning of surgery [phone]   Follow clear liquid diet the day before surgery.   You may have the following liquids until 9:00 AM DAY OF SURGERY  Water Black Coffee (sugar ok, NO MILK/CREAM OR CREAMERS)  Tea (sugar ok, NO MILK/CREAM OR CREAMERS) regular and decaf                             Plain Jell-O (NO RED)                                           Fruit ices (not with fruit pulp, NO RED)                                     Popsicles (NO RED)                                                                  Juice: apple, WHITE grape, WHITE cranberry Sports drinks like Gatorade (NO RED) Clear broth(vegetable,chicken,beef)               Drink 2 Ensure drinks AT 10:00 PM the night before surgery.        The day of surgery:  Drink ONE (1) Pre-Surgery Clear Ensure at 9:00 AM the morning of surgery. Drink in one sitting. Do not sip.  This drink was given to you during your hospital  pre-op appointment visit. Nothing else to drink after completing the  Pre-Surgery Clear Ensure.          If you have questions, please contact your surgeon's  office.   FOLLOW BOWEL PREP AND ANY ADDITIONAL PRE OP INSTRUCTIONS YOU RECEIVED FROM YOUR SURGEON'S OFFICE!!!     Oral Hygiene is also important to reduce your risk of infection.                                    Remember - BRUSH YOUR TEETH THE MORNING OF SURGERY WITH YOUR REGULAR  TOOTHPASTE   Take these medicines the morning of surgery with A SIP OF WATER: Tylenol, Inhalers, Nebulizer, Allopurinol, Doxycycline, Metoprolol, Rosuvastatin   Bring CPAP mask and tubing day of surgery.                              You may not have any metal on your body including jewelry, and body piercing             Do not wear lotions, powders, cologne, or deodorant              Men may shave face and neck.   Do not bring valuables to the hospital. [place].   Bring small overnight bag day of surgery.   DO NOT [place]. PHARMACY WILL DISPENSE MEDICATIONS LISTED ON YOUR MEDICATION LIST TO YOU DURING YOUR ADMISSION [place]!    Special Instructions: Bring a copy of your healthcare power of attorney and living will documents         the day of surgery if you haven't scanned them before.              Please read over the following fact sheets you were given: IF YOU HAVE QUESTIONS ABOUT YOUR PRE-OP INSTRUCTIONS PLEASE CALL [place] - Preparing for Surgery Before surgery, you can play an important role.  Because skin is not sterile, your skin needs to be as free of germs as possible.  You can reduce the number of germs on your skin by washing with CHG (chlorahexidine gluconate) soap before surgery.  CHG is an antiseptic cleaner which kills germs and bonds with the skin to continue killing germs even after washing. Please DO NOT use if you have an allergy to CHG or antibacterial soaps.  If your skin becomes reddened/irritated stop using the CHG and inform your nurse when you arrive at Short  Stay. Do not shave (including legs and underarms) for at least 48 hours prior to the first CHG shower.  You may shave your face/neck.  Please follow these instructions carefully:  1.  Shower with CHG Soap the night before surgery and the  morning of surgery.  2.  If you choose to wash your hair, wash your hair first as usual with your normal  shampoo.  3.  After you shampoo, rinse your hair and body thoroughly to remove the shampoo.                             4.  Use CHG as you would any other liquid soap.  You can apply chg directly to the skin and wash.  Gently with a scrungie or clean washcloth.  5.  Apply the CHG Soap to your body ONLY FROM THE NECK DOWN.   Do   not use on face/ open                           Wound or open sores. Avoid contact with eyes, ears mouth and   genitals (private parts).                       Wash face,  Development worker, international aid (private parts)  with your normal soap.             6.  Wash thoroughly, paying special attention to the area where your    surgery  will be performed.  7.  Thoroughly rinse your body with warm water from the neck down.  8.  DO NOT shower/wash with your normal soap after using and rinsing off the CHG Soap.                9.  Pat yourself dry with a clean towel.            10.  Wear clean pajamas.            11.  Place clean sheets on your bed the night of your first shower and do not  sleep with pets. Day of Surgery : Do not apply any lotions/deodorants the morning of surgery.  Please wear clean clothes to the hospital/surgery center.  FAILURE TO FOLLOW THESE INSTRUCTIONS MAY RESULT IN THE CANCELLATION OF YOUR SURGERY  PATIENT SIGNATURE_________________________________  NURSE SIGNATURE__________________________________  ________________________________________________________________________   Adam Phenix  An incentive spirometer is a tool that can help keep your lungs clear and active. This tool measures how well you are filling your  lungs with each breath. Taking long deep breaths may help reverse or decrease the chance of developing breathing (pulmonary) problems (especially infection) following: A long period of time when you are unable to move or be active. BEFORE THE PROCEDURE  If the spirometer includes an indicator to show your best effort, your nurse or respiratory therapist will set it to a desired goal. If possible, sit up straight or lean slightly forward. Try not to slouch. Hold the incentive spirometer in an upright position. INSTRUCTIONS FOR USE  Sit on the edge of your bed if possible, or sit up as far as you can in bed or on a chair. Hold the incentive spirometer in an upright position. Breathe out normally. Place the mouthpiece in your mouth and seal your lips tightly around it. Breathe in slowly and as deeply as possible, raising the piston or the ball toward the top of the column. Hold your breath for 3-5 seconds or for as long as possible. Allow the piston or ball to fall to the bottom of the column. Remove the mouthpiece from your mouth and breathe out normally. Rest for a few seconds and repeat Steps 1 through 7 at least 10 times every 1-2 hours when you are awake. Take your time and take a few normal breaths between deep breaths. The spirometer may include an indicator to show your best effort. Use the indicator as a goal to work toward during each repetition. After each set of 10 deep breaths, practice coughing to be sure your lungs are clear. If you have an incision (the cut made at the time of surgery), support your incision when coughing by placing a pillow or rolled up towels firmly against it. Once you are able to get out of bed, walk around indoors and cough well. You may stop using the incentive spirometer when instructed by your caregiver.  RISKS AND COMPLICATIONS Take your time so you do not get dizzy or light-headed. If you are in pain, you may need to take or ask for pain medication before  doing incentive spirometry. It is harder to take a deep breath if you are having pain. AFTER USE Rest and breathe slowly and easily. It can be helpful to keep track of a log  of your progress. Your caregiver can provide you with a simple table to help with this. If you are using the spirometer at home, follow these instructions: [place] IF:  You are having difficultly using the spirometer. You have trouble using the spirometer as often as instructed. Your pain medication is not giving enough relief while using the spirometer. You develop fever of 100.5 F (38.1 C) or higher. SEEK IMMEDIATE MEDICAL CARE IF:  You cough up bloody sputum that had not been present before. You develop fever of 102 F (38.9 C) or greater. You develop worsening pain at or near the incision site. MAKE SURE YOU:  Understand these instructions. Will watch your condition. Will get help right away if you are not doing well or get worse. Document Released: 12/01/2006 Document Revised: 10/13/2011 Document Reviewed: 02/01/2007 ExitCare Patient Information 2014 [employer], [place].   ________________________________________________________________________  WHAT IS A BLOOD TRANSFUSION? Blood Transfusion Information  A transfusion is the replacement of blood or some of its parts. Blood is made up of multiple cells which provide different functions. Red blood cells carry oxygen and are used for blood loss replacement. White blood cells fight against infection. Platelets control bleeding. Plasma helps clot blood. Other blood products are available for specialized needs, such as hemophilia or other clotting disorders. BEFORE THE TRANSFUSION  Who gives blood for transfusions?  Healthy volunteers who are fully evaluated to make sure their blood is safe. This is blood bank blood. Transfusion therapy is the safest it has ever been in the practice of medicine. Before blood is taken from a donor, a complete history is  taken to make sure that person has no history of diseases nor engages in risky social behavior (examples are intravenous drug use or sexual activity with multiple partners). The donor's travel history is screened to minimize risk of transmitting infections, such as malaria. The donated blood is tested for signs of infectious diseases, such as HIV and hepatitis. The blood is then tested to be sure it is compatible with you in order to minimize the chance of a transfusion reaction. If you or a relative donates blood, this is often done in anticipation of surgery and is not appropriate for emergency situations. It takes many days to process the donated blood. RISKS AND COMPLICATIONS Although transfusion therapy is very safe and saves many lives, the main dangers of transfusion include:  Getting an infectious disease. Developing a transfusion reaction. This is an allergic reaction to something in the blood you were given. Every precaution is taken to prevent this. The decision to have a blood transfusion has been considered carefully by your caregiver before blood is given. Blood is not given unless the benefits outweigh the risks. AFTER THE TRANSFUSION Right after receiving a blood transfusion, you will usually feel much better and more energetic. This is especially true if your red blood cells have gotten low (anemic). The transfusion raises the level of the red blood cells which carry oxygen, and this usually causes an energy increase. The nurse administering the transfusion will monitor you carefully for complications. HOME CARE INSTRUCTIONS  No special instructions are needed after a transfusion. You may find your energy is better. Speak with your caregiver about any limitations on activity for underlying diseases you may have. SEEK MEDICAL CARE IF:  Your condition is not improving after your transfusion. You develop redness or irritation at the intravenous (IV) site. SEEK IMMEDIATE MEDICAL CARE IF:   Any of the following symptoms occur over the  next 12 hours: Shaking chills. You have a temperature by mouth above 102 F (38.9 C), not controlled by medicine. Chest, back, or muscle pain. People around you feel you are not acting correctly or are confused. Shortness of breath or difficulty breathing. Dizziness and fainting. You get a rash or develop hives. You have a decrease in urine output. Your urine turns a dark color or changes to pink, red, or brown. Any of the following symptoms occur over the next 10 days: You have a temperature by mouth above 102 F (38.9 C), not controlled by medicine. Shortness of breath. Weakness after normal activity. The white part of the eye turns yellow (jaundice). You have a decrease in the amount of urine or are urinating less often. Your urine turns a dark color or changes to pink, red, or brown. Document Released: 07/18/2000 Document Revised: 10/13/2011 Document Reviewed: 03/06/2008 [facility] Patient Information 2014 [place], [place].  _______________________________________________________________________

## 2022-02-05 NOTE — Progress Notes (Addendum)
COVID Vaccine Completed: yes x3  Date of COVID positive in last 90 days:  PCP - [name], MD Cardiologist - Carlyle Dolly, MD  Cardiac clearance by Darius Bump 12/26/21 in Epic Pulmonary clearance by June Leap in Epic  Chest x-ray - CT 06/09/21 Epic EKG - 08/19/21 Epic Stress Test -  ECHO - 08/17/21 Epic Cardiac Cath - 08/01/21 Epic Pacemaker/ICD device last checked: Spinal Cord Stimulator:  Bowel Prep -   Sleep Study -  CPAP -   Fasting Blood Sugar -  Checks Blood Sugar _____ times a day  Blood Thinner Instructions: Eliquis, hold 2 days Aspirin Instructions: Last Dose:  Activity level:  Can go up a flight of stairs and perform activities of daily living without stopping and without symptoms of chest pain or shortness of breath.  Able to exercise without symptoms  Unable to go up a flight of stairs without symptoms of     Anesthesia review: OSA, DVT, CAD, PE, COPD, hepatic stenosis  Patient denies shortness of breath, fever, cough and chest pain at PAT appointment  Patient verbalized understanding of instructions that were given to them at the PAT appointment. Patient was also instructed that they will need to review over the PAT instructions again at home before surgery.

## 2022-02-06 ENCOUNTER — Encounter (HOSPITAL_COMMUNITY)
Admission: RE | Admit: 2022-02-06 | Discharge: 2022-02-06 | Disposition: A | Discharge status: Home / Self Care | Payer: BC Managed Care – PPO | Source: Ambulatory Visit | Attending: General Surgery | Admitting: General Surgery

## 2022-02-06 ENCOUNTER — Encounter (HOSPITAL_COMMUNITY): Payer: Self-pay

## 2022-02-06 VITALS — BP 139/84 | HR 65 | Temp 98.0°F | Resp 14 | Ht 74.0 in | Wt 234.8 lb

## 2022-02-06 DIAGNOSIS — G4733 Obstructive sleep apnea (adult) (pediatric): Secondary | ICD-10-CM | POA: Insufficient documentation

## 2022-02-06 DIAGNOSIS — Z86718 Personal history of other venous thrombosis and embolism: Secondary | ICD-10-CM | POA: Diagnosis not present

## 2022-02-06 DIAGNOSIS — K5790 Diverticulosis of intestine, part unspecified, without perforation or abscess without bleeding: Secondary | ICD-10-CM | POA: Diagnosis not present

## 2022-02-06 DIAGNOSIS — I10 Essential (primary) hypertension: Secondary | ICD-10-CM | POA: Insufficient documentation

## 2022-02-06 DIAGNOSIS — D6851 Activated protein C resistance: Secondary | ICD-10-CM | POA: Insufficient documentation

## 2022-02-06 DIAGNOSIS — Z87891 Personal history of nicotine dependence: Secondary | ICD-10-CM | POA: Insufficient documentation

## 2022-02-06 DIAGNOSIS — J449 Chronic obstructive pulmonary disease, unspecified: Secondary | ICD-10-CM | POA: Diagnosis not present

## 2022-02-06 DIAGNOSIS — Z7901 Long term (current) use of anticoagulants: Secondary | ICD-10-CM | POA: Insufficient documentation

## 2022-02-06 DIAGNOSIS — K219 Gastro-esophageal reflux disease without esophagitis: Secondary | ICD-10-CM | POA: Insufficient documentation

## 2022-02-06 DIAGNOSIS — Z01818 Encounter for other preprocedural examination: Secondary | ICD-10-CM

## 2022-02-06 DIAGNOSIS — Z01812 Encounter for preprocedural laboratory examination: Secondary | ICD-10-CM | POA: Diagnosis present

## 2022-02-06 HISTORY — DX: Fatty (change of) liver, not elsewhere classified: K76.0

## 2022-02-06 HISTORY — DX: Polyneuropathy, unspecified: G62.9

## 2022-02-07 NOTE — Progress Notes (Signed)
Anesthesia Chart Review   Case: [id number] Date/Time: 02/12/22 1145   Procedures:      XI ROBOT ASSISTED LAPAROSCOPIC PARTIAL COLECTOMY     POSSIBLE APPENDECTOMY     CYSTOSCOPY with FIREFLY INJECTION   Anesthesia type: General   Pre-op diagnosis: DIVERTICULAR DISEASE   Location: [facility] ROOM 02 / [facility]   Surgeons: Leighton Ruff, MD; Lucas Mallow, MD       DISCUSSION:66 y.o. former smoker with h/o HTN, GERD, COPD, OSA, Factor V Leiden, diverticular disease scheduled for above procedure 5/72/6203 with Dr. Leighton Ruff and Dr. Link Snuffer.   Per cardiology preoperative evaluation 12/26/2021, "Chart reviewed as part of pre-operative protocol coverage. Given past medical history and time since last visit, based on ACC/AHA guidelines, VERON SENNER would be at acceptable risk for the planned procedure without further cardiovascular testing.    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 Cardiology has not been managing pt for this condition/medication. Recommend clearance come from managing provider."  Pt last seen by pulmonology 01/02/2022. Per OV note, "Has plans for colectomy with Dr. Marcello Moores. I think he will do fine with surgery. I think he is low to moderate risk for perioperative pulmonary complication. He is okay to stop his anticoagulation 2 days prior to surgery and restart after surgery complete. Hopefully he could restart anticoagulation postop day 1 if possible."  Anticipate pt can proceed with planned procedure barring acute status change.   VS: BP 139/84   Pulse 65   Temp 36.7 C (Oral)   Resp 14   Ht '6\' 2"'$  (1.88 m)   Wt 106.5 kg   SpO2 96%   BMI 30.15 kg/m   PROVIDERS: [name], MD is PCP   Primary Cardiologist: Carlyle Dolly, MD  June Leap, MD is Pulmonologist  LABS: Labs reviewed: Acceptable for surgery. (all labs ordered are listed, but only abnormal results are displayed)  Labs Reviewed  TYPE AND SCREEN      IMAGES:   EKG: 08/19/2021 Rate 68 bpm  Sinus or ectopic atrial rhythm  Prolonged PR interval  Low voltage, precordial leads Abnormal R-wave progression, early transition  CV: Echo 08/17/2021 1. Left ventricular ejection fraction, by estimation, is 60 to 65%. The  left ventricle has normal function. The left ventricle has no regional  wall motion abnormalities. There is moderate concentric left ventricular  hypertrophy. Left ventricular  diastolic parameters are consistent with Grade I diastolic dysfunction  (impaired relaxation).   2. Right ventricular systolic function is normal. The right ventricular  size is normal. Tricuspid regurgitation signal is inadequate for assessing  PA pressure.   3. The mitral valve is normal in structure. No evidence of mitral valve  regurgitation. No evidence of mitral stenosis.   4. The aortic valve is tricuspid. Aortic valve regurgitation is not  visualized. No aortic stenosis is present.   5. The inferior vena cava is normal in size with greater than 50%  respiratory variability, suggesting right atrial pressure of 3 mmHg.   6. Prominent pericardial fat.  Past Medical History:  Diagnosis Date   Bilateral shoulder bursitis    Chronic back pain    COPD (chronic obstructive pulmonary disease) with emphysema ([name])    DDD (degenerative disc disease), lumbar    hx epideral injection  L5--S1    Diverticulitis    DVT (deep venous thrombosis) ([place]) 11/19/2018   Dysphasia    intermittant w/ food    Dyspnea    Dyspnea on exertion  Fatty liver    GERD (gastroesophageal reflux disease)    Gout    L great toe   Heterozygous factor V Leiden mutation ([place])    Hiatal hernia    Hip problem 2020   left, seeing orthopedics   History of acute pancreatitis    alcoholic pancreatitis 14-43-1540 and 01-30-2012. 2021   History of colon polyps    10-17-2005  hyperplastic polyp's   History of esophageal stricture    s/p  dilation 02-02-2017    History of peptic ulcer 1990s   History of pneumococcal pneumonia 2006   bilateral pneumonia w/ left lower lobe abscess treated w/ chest tube with suction for drainage   History of pneumothorax    1984--  left spontaneous pneumothorax ,  treated w/ chest tube   Hypertension    Malignant neoplasm of prostate [facility]) urologist-  dr dahlstedt/  oncologist -- dr Tammi Klippel   dx 12-02-2016-- Stage T2b, Gleason 3+4,  PSA 5.99,  vol 25cc   Neuropathy    Numbness of left foot    outer left ankle numb-- per pt has appt. w/ neurologist   OSA (obstructive sleep apnea)    per pt has not used cpap over year ago from 04-16-2017--- per study 01-17-2010  severe osa   Pneumonia    Solid nodule of lung greater than 8 mm in diameter 05/11/2018   03/2018: RUL Nodule, 21m   Wears glasses     Past Surgical History:  Procedure Laterality Date   BIOPSY  02/02/2017   Procedure: BIOPSY;  Surgeon: RDaneil Dolin MD;  [facility];  Service: Endoscopy;;  duodenal bx's, esophgeal bx's   BIOPSY  07/30/2017   Procedure: BIOPSY;  Surgeon: RDaneil Dolin MD;  [facility];  Service: Endoscopy;;  esophagus   BIOPSY  02/15/2021   Procedure: BIOPSY;  Surgeon: RDaneil Dolin MD;  [facility];  Service: Endoscopy;;   CARDIOVASCULAR STRESS TEST  09/12/2009   normal nuclear perfusion study w/ no ischemia /  normal LV function and wall motion , ef 57%   COLONOSCOPY  2007   Dr. RGala Romney hyperplastic polyps, internal hemorrhoids    COLONOSCOPY WITH PROPOFOL N/A 05/03/2018   Dr. RGala Romney diverticulosis in entire colon, multiple polyps in sigmoid, at splenic flexure, and ascending colon, not all polyps removed. Tubular adenomas and inflammatory polyps. Needs 3 year surveilance   COLONOSCOPY WITH PROPOFOL N/A 02/15/2021   Many polyps without adenomatous changes but all inflammatory in nature with mildly active colitis and chronic inflammatory mucosa.   EPIDURAL BLOCK INJECTION  2020    ESOPHAGOGASTRODUODENOSCOPY (EGD) WITH PROPOFOL N/A 02/02/2017   Dr. RGala Romney esophageal stenosis s/p dilation and biopsy, medium sized hiatal hernia, normal duodenum   ESOPHAGOGASTRODUODENOSCOPY (EGD) WITH PROPOFOL N/A 07/30/2017   Dr. RGala Romney esophageal stenosis s/p dilation and biopsy, medium-sized hiatal hernia, normal duodenum   HEMORRHOID SURGERY  01-21-2008    AForestine Na  HIP ARTHROPLASTY Bilateral    INTRAVASCULAR PRESSURE WIRE/FFR STUDY N/A 08/01/2021   Procedure: INTRAVASCULAR PRESSURE WIRE/FFR STUDY;  Surgeon: MBurnell Blanks MD;  [facility];  Service: Cardiovascular;  Laterality: N/A;   IR RADIOLOGIST EVAL & MGMT  08/29/2020   LEFT HEART CATH AND CORONARY ANGIOGRAPHY N/A 08/01/2021   Procedure: LEFT HEART CATH AND CORONARY ANGIOGRAPHY;  Surgeon: MBurnell Blanks MD;  [facility];  Service: Cardiovascular;  Laterality: N/A;   MALONEY DILATION N/A 02/02/2017   Procedure: MALONEY DILATION;  Surgeon: Daneil Dolin, MD;  [facility];  Service: Endoscopy;  Laterality: N/A;   MALONEY DILATION N/A 07/30/2017   Procedure: Venia Minks DILATION;  Surgeon: Daneil Dolin, MD;  [facility];  Service: Endoscopy;  Laterality: N/A;   POLYPECTOMY  05/03/2018   Procedure: POLYPECTOMY;  Surgeon: Daneil Dolin, MD;  [facility];  Service: Endoscopy;;  ascending colon polyp, sigmoid colon polyps (multiple)   POLYPECTOMY  02/15/2021   Procedure: POLYPECTOMY;  Surgeon: Daneil Dolin, MD;  [facility];  Service: Endoscopy;;   RADIOACTIVE SEED IMPLANT N/A 04/23/2017   Procedure: RADIOACTIVE SEED IMPLANT/BRACHYTHERAPY IMPLANT;  Surgeon: Franchot Gallo, MD;  [facility];  Service: Urology;  Laterality: N/A;   SHOULDER ARTHROSCOPY  08/05/2003   SPACE OAR INSTILLATION N/A 04/23/2017   Procedure: SPACE OAR INSTILLATION;  Surgeon: Franchot Gallo, MD;  Location: [facility];  Service: Urology;  Laterality: N/A;   TRANSTHORACIC ECHOCARDIOGRAM  03/28/2009   mild LVH, ef 55-60%/ mild aorta calcification   VASECTOMY  08/05/1983   VIDEO ASSISTED THORACOSCOPY (VATS)/DECORTICATION Left     MEDICATIONS:  acetaminophen (TYLENOL) 500 MG tablet   albuterol (PROVENTIL) (2.5 MG/3ML) 0.083% nebulizer solution   albuterol (VENTOLIN HFA) 108 (90 Base) MCG/ACT inhaler   allopurinol (ZYLOPRIM) 300 MG tablet   apixaban (ELIQUIS) 5 MG TABS tablet   doxycycline (VIBRAMYCIN) 50 MG capsule   Fluticasone-Umeclidin-Vilant (TRELEGY ELLIPTA) 100-62.5-25 MCG/ACT AEPB   metoprolol succinate (TOPROL XL) 25 MG 24 hr tablet   metroNIDAZOLE (FLAGYL) 500 MG tablet   Multiple Vitamin (MULTIVITAMIN WITH MINERALS) TABS tablet   NON FORMULARY   polyethylene glycol powder (GLYCOLAX/MIRALAX) 17 GM/SCOOP powder   Propylene Glycol-Glycerin (SOOTHE OP)   rosuvastatin (CRESTOR) 20 MG tablet   No current facility-administered medications for this encounter.     Joe Felix Ward, PA-C [facility] [phone]

## 2022-02-07 NOTE — Anesthesia Preprocedure Evaluation (Signed)
Anesthesia Evaluation  Patient identified by MRN, date of birth, ID band Patient awake    Reviewed: Allergy & Precautions, NPO status , Patient's Chart, lab work & pertinent test results  Airway Mallampati: III  TM Distance: <3 FB Neck ROM: Full    Dental no notable dental hx.    Pulmonary sleep apnea , COPD, former smoker,    Pulmonary exam normal breath sounds clear to auscultation       Cardiovascular hypertension, + DOE and + DVT  Normal cardiovascular exam Rhythm:Regular Rate:Normal  1. Left ventricular ejection fraction, by estimation, is 60 to 65%. The  left ventricle has normal function. The left ventricle has no regional  wall motion abnormalities. There is moderate concentric left ventricular  hypertrophy. Left ventricular  diastolic parameters are consistent with Grade I diastolic dysfunction  (impaired relaxation).  2. Right ventricular systolic function is normal. The right ventricular  size is normal. Tricuspid regurgitation signal is inadequate for assessing  PA pressure.  3. The mitral valve is normal in structure. No evidence of mitral valve  regurgitation. No evidence of mitral stenosis.  4. The aortic valve is tricuspid. Aortic valve regurgitation is not  visualized. No aortic stenosis is present.  5. The inferior vena cava is normal in size with greater than 50%  respiratory variability, suggesting right atrial pressure of 3 mmHg.   Neuro/Psych negative neurological ROS  negative psych ROS   GI/Hepatic hiatal hernia, GERD  ,(+)     substance abuse  alcohol use,   Endo/Other  negative endocrine ROS  Renal/GU negative Renal ROS  negative genitourinary   Musculoskeletal negative musculoskeletal ROS (+)   Abdominal   Peds negative pediatric ROS (+)  Hematology negative hematology ROS (+)   Anesthesia Other Findings   Reproductive/Obstetrics negative OB ROS                             Anesthesia Physical Anesthesia Plan  ASA: 3  Anesthesia Plan: General   Post-op Pain Management: Tylenol PO (pre-op)*   Induction: Intravenous  PONV Risk Score and Plan: 2 and Ondansetron, Dexamethasone and Treatment may vary due to age or medical condition  Airway Management Planned: Oral ETT  Additional Equipment:   Intra-op Plan:   Post-operative Plan: Extubation in OR  Informed Consent: I have reviewed the patients History and Physical, chart, labs and discussed the procedure including the risks, benefits and alternatives for the proposed anesthesia with the patient or authorized representative who has indicated his/her understanding and acceptance.     Dental advisory given  Plan Discussed with: CRNA and Surgeon  Anesthesia Plan Comments: (See PAT note 02/06/2022)       Anesthesia Quick Evaluation

## 2022-02-12 ENCOUNTER — Encounter (HOSPITAL_COMMUNITY)
Admission: RE | Disposition: A | Discharge status: Home / Self Care | Payer: Self-pay | Source: Home / Self Care | Attending: General Surgery

## 2022-02-12 ENCOUNTER — Other Ambulatory Visit: Payer: Self-pay

## 2022-02-12 ENCOUNTER — Inpatient Hospital Stay (HOSPITAL_COMMUNITY): Payer: BC Managed Care – PPO | Admitting: Anesthesiology

## 2022-02-12 ENCOUNTER — Encounter (HOSPITAL_COMMUNITY): Payer: Self-pay | Admitting: General Surgery

## 2022-02-12 ENCOUNTER — Inpatient Hospital Stay (HOSPITAL_COMMUNITY): Payer: BC Managed Care – PPO | Admitting: Physician Assistant

## 2022-02-12 ENCOUNTER — Inpatient Hospital Stay (HOSPITAL_COMMUNITY)
Admission: RE | Admit: 2022-02-12 | Discharge: 2022-02-14 | DRG: 330 | Disposition: A | Discharge status: Home / Self Care | Payer: BC Managed Care – PPO | Attending: General Surgery | Admitting: General Surgery

## 2022-02-12 DIAGNOSIS — K5792 Diverticulitis of intestine, part unspecified, without perforation or abscess without bleeding: Principal | ICD-10-CM | POA: Diagnosis present

## 2022-02-12 DIAGNOSIS — Z8249 Family history of ischemic heart disease and other diseases of the circulatory system: Secondary | ICD-10-CM | POA: Diagnosis not present

## 2022-02-12 DIAGNOSIS — K219 Gastro-esophageal reflux disease without esophagitis: Secondary | ICD-10-CM | POA: Diagnosis present

## 2022-02-12 DIAGNOSIS — D6851 Activated protein C resistance: Secondary | ICD-10-CM | POA: Diagnosis present

## 2022-02-12 DIAGNOSIS — Z8546 Personal history of malignant neoplasm of prostate: Secondary | ICD-10-CM | POA: Diagnosis not present

## 2022-02-12 DIAGNOSIS — Z9852 Vasectomy status: Secondary | ICD-10-CM

## 2022-02-12 DIAGNOSIS — Z86711 Personal history of pulmonary embolism: Secondary | ICD-10-CM | POA: Diagnosis not present

## 2022-02-12 DIAGNOSIS — Z8711 Personal history of peptic ulcer disease: Secondary | ICD-10-CM | POA: Diagnosis not present

## 2022-02-12 DIAGNOSIS — Z79899 Other long term (current) drug therapy: Secondary | ICD-10-CM | POA: Diagnosis not present

## 2022-02-12 DIAGNOSIS — K5790 Diverticulosis of intestine, part unspecified, without perforation or abscess without bleeding: Secondary | ICD-10-CM | POA: Diagnosis present

## 2022-02-12 DIAGNOSIS — M5136 Other intervertebral disc degeneration, lumbar region: Secondary | ICD-10-CM | POA: Diagnosis present

## 2022-02-12 DIAGNOSIS — Z96643 Presence of artificial hip joint, bilateral: Secondary | ICD-10-CM | POA: Diagnosis present

## 2022-02-12 DIAGNOSIS — I10 Essential (primary) hypertension: Secondary | ICD-10-CM | POA: Diagnosis present

## 2022-02-12 DIAGNOSIS — Z86718 Personal history of other venous thrombosis and embolism: Secondary | ICD-10-CM

## 2022-02-12 DIAGNOSIS — Z86010 Personal history of colonic polyps: Secondary | ICD-10-CM

## 2022-02-12 DIAGNOSIS — J439 Emphysema, unspecified: Secondary | ICD-10-CM | POA: Diagnosis present

## 2022-02-12 DIAGNOSIS — M109 Gout, unspecified: Secondary | ICD-10-CM | POA: Diagnosis present

## 2022-02-12 DIAGNOSIS — K760 Fatty (change of) liver, not elsewhere classified: Secondary | ICD-10-CM | POA: Diagnosis present

## 2022-02-12 DIAGNOSIS — G8929 Other chronic pain: Secondary | ICD-10-CM | POA: Diagnosis present

## 2022-02-12 DIAGNOSIS — I2510 Atherosclerotic heart disease of native coronary artery without angina pectoris: Secondary | ICD-10-CM | POA: Diagnosis present

## 2022-02-12 DIAGNOSIS — Z7901 Long term (current) use of anticoagulants: Secondary | ICD-10-CM

## 2022-02-12 DIAGNOSIS — Z87891 Personal history of nicotine dependence: Secondary | ICD-10-CM

## 2022-02-12 DIAGNOSIS — G4733 Obstructive sleep apnea (adult) (pediatric): Secondary | ICD-10-CM | POA: Diagnosis present

## 2022-02-12 LAB — ABO/RH: ABO/RH(D): O POS

## 2022-02-12 LAB — TYPE AND SCREEN
ABO/RH(D): O POS
Antibody Screen: NEGATIVE

## 2022-02-12 SURGERY — COLECTOMY, PARTIAL, ROBOT-ASSISTED, LAPAROSCOPIC
Anesthesia: General

## 2022-02-12 MED ORDER — HYDROMORPHONE HCL 1 MG/ML IJ SOLN
0.2500 mg | INTRAMUSCULAR | Status: DC | PRN
  Administered 2022-02-12 (×4): 0.5 mg via INTRAVENOUS

## 2022-02-12 MED ORDER — BUPIVACAINE-EPINEPHRINE 0.25% -1:200000 IJ SOLN
INTRAMUSCULAR | Status: DC | PRN
  Administered 2022-02-12: 30 mL

## 2022-02-12 MED ORDER — UMECLIDINIUM BROMIDE 62.5 MCG/ACT IN AEPB
1.0000 | INHALATION_SPRAY | Freq: Every day | RESPIRATORY_TRACT | Status: DC
  Administered 2022-02-13 – 2022-02-14 (×2): 1 via RESPIRATORY_TRACT
  Filled 2022-02-12: qty 7

## 2022-02-12 MED ORDER — BUPIVACAINE LIPOSOME 1.3 % IJ SUSP
INTRAMUSCULAR | Status: DC | PRN
  Administered 2022-02-12: 20 mL

## 2022-02-12 MED ORDER — FLUTICASONE FUROATE-VILANTEROL 100-25 MCG/ACT IN AEPB
1.0000 | INHALATION_SPRAY | Freq: Every day | RESPIRATORY_TRACT | Status: DC
  Administered 2022-02-13 – 2022-02-14 (×2): 1 via RESPIRATORY_TRACT
  Filled 2022-02-12: qty 28

## 2022-02-12 MED ORDER — ACETAMINOPHEN 500 MG PO TABS
1000.0000 mg | ORAL_TABLET | ORAL | Status: DC
  Filled 2022-02-12: qty 2

## 2022-02-12 MED ORDER — DEXAMETHASONE SODIUM PHOSPHATE 10 MG/ML IJ SOLN
INTRAMUSCULAR | Status: AC
  Filled 2022-02-12: qty 1

## 2022-02-12 MED ORDER — ROCURONIUM BROMIDE 10 MG/ML (PF) SYRINGE
PREFILLED_SYRINGE | INTRAVENOUS | Status: AC
  Filled 2022-02-12: qty 10

## 2022-02-12 MED ORDER — TRAMADOL HCL 50 MG PO TABS
50.0000 mg | ORAL_TABLET | Freq: Four times a day (QID) | ORAL | Status: DC | PRN
  Administered 2022-02-12 – 2022-02-14 (×6): 100 mg via ORAL
  Filled 2022-02-12 (×6): qty 2

## 2022-02-12 MED ORDER — ROCURONIUM BROMIDE 100 MG/10ML IV SOLN
INTRAVENOUS | Status: DC | PRN
  Administered 2022-02-12: 20 mg via INTRAVENOUS
  Administered 2022-02-12: 80 mg via INTRAVENOUS

## 2022-02-12 MED ORDER — STERILE WATER FOR INJECTION IJ SOLN
INTRAMUSCULAR | Status: DC | PRN
  Administered 2022-02-12: 15 mL

## 2022-02-12 MED ORDER — ENSURE PRE-SURGERY PO LIQD
296.0000 mL | Freq: Once | ORAL | Status: DC
  Filled 2022-02-12: qty 296

## 2022-02-12 MED ORDER — LIDOCAINE HCL (PF) 2 % IJ SOLN
INTRAMUSCULAR | Status: AC
  Filled 2022-02-12: qty 20

## 2022-02-12 MED ORDER — KCL IN DEXTROSE-NACL 20-5-0.45 MEQ/L-%-% IV SOLN
INTRAVENOUS | Status: DC
  Filled 2022-02-12: qty 1000

## 2022-02-12 MED ORDER — PROPOFOL 10 MG/ML IV BOLUS
INTRAVENOUS | Status: AC
  Filled 2022-02-12: qty 20

## 2022-02-12 MED ORDER — ONDANSETRON HCL 4 MG/2ML IJ SOLN
INTRAMUSCULAR | Status: DC | PRN
  Administered 2022-02-12: 4 mg via INTRAVENOUS

## 2022-02-12 MED ORDER — STERILE WATER FOR INJECTION IJ SOLN
INTRAMUSCULAR | Status: AC
  Filled 2022-02-12: qty 10

## 2022-02-12 MED ORDER — LACTATED RINGERS IV SOLN: INTRAVENOUS | Status: DC

## 2022-02-12 MED ORDER — ONDANSETRON HCL 4 MG/2ML IJ SOLN: 4.0000 mg | Freq: Four times a day (QID) | INTRAMUSCULAR | Status: DC | PRN

## 2022-02-12 MED ORDER — GABAPENTIN 300 MG PO CAPS
300.0000 mg | ORAL_CAPSULE | Freq: Two times a day (BID) | ORAL | Status: DC
  Administered 2022-02-12 – 2022-02-14 (×4): 300 mg via ORAL
  Filled 2022-02-12 (×4): qty 1

## 2022-02-12 MED ORDER — SACCHAROMYCES BOULARDII 250 MG PO CAPS
250.0000 mg | ORAL_CAPSULE | Freq: Two times a day (BID) | ORAL | Status: DC
  Administered 2022-02-12 – 2022-02-14 (×4): 250 mg via ORAL
  Filled 2022-02-12 (×4): qty 1

## 2022-02-12 MED ORDER — OXYCODONE HCL 5 MG PO TABS: 5.0000 mg | ORAL_TABLET | Freq: Once | ORAL | Status: DC | PRN

## 2022-02-12 MED ORDER — ALUM & MAG HYDROXIDE-SIMETH 200-200-20 MG/5ML PO SUSP: 30.0000 mL | Freq: Four times a day (QID) | ORAL | Status: DC | PRN

## 2022-02-12 MED ORDER — CHLORHEXIDINE GLUCONATE 0.12 % MT SOLN
15.0000 mL | Freq: Once | OROMUCOSAL | Status: AC
  Administered 2022-02-12: 15 mL via OROMUCOSAL

## 2022-02-12 MED ORDER — FENTANYL CITRATE (PF) 100 MCG/2ML IJ SOLN
INTRAMUSCULAR | Status: DC | PRN
  Administered 2022-02-12: 50 ug via INTRAVENOUS
  Administered 2022-02-12: 100 ug via INTRAVENOUS
  Administered 2022-02-12 (×2): 50 ug via INTRAVENOUS

## 2022-02-12 MED ORDER — EPHEDRINE SULFATE (PRESSORS) 50 MG/ML IJ SOLN
INTRAMUSCULAR | Status: DC | PRN
  Administered 2022-02-12 (×2): 10 mg via INTRAVENOUS

## 2022-02-12 MED ORDER — SODIUM CHLORIDE 0.9 % IV SOLN
2.0000 g | INTRAVENOUS | Status: AC
  Administered 2022-02-12: 2 g via INTRAVENOUS
  Filled 2022-02-12: qty 2

## 2022-02-12 MED ORDER — ALLOPURINOL 300 MG PO TABS
300.0000 mg | ORAL_TABLET | Freq: Every day | ORAL | Status: DC
  Administered 2022-02-13 – 2022-02-14 (×2): 300 mg via ORAL
  Filled 2022-02-12 (×2): qty 1

## 2022-02-12 MED ORDER — KETAMINE HCL 10 MG/ML IJ SOLN
INTRAMUSCULAR | Status: AC
  Filled 2022-02-12: qty 1

## 2022-02-12 MED ORDER — ALVIMOPAN 12 MG PO CAPS: 12.0000 mg | ORAL_CAPSULE | Freq: Two times a day (BID) | ORAL | Status: DC

## 2022-02-12 MED ORDER — SPY AGENT GREEN - (INDOCYANINE FOR INJECTION)
INTRAMUSCULAR | Status: DC | PRN
  Administered 2022-02-12: 5 mL via INTRAVENOUS

## 2022-02-12 MED ORDER — SIMETHICONE 80 MG PO CHEW: 40.0000 mg | CHEWABLE_TABLET | Freq: Four times a day (QID) | ORAL | Status: DC | PRN

## 2022-02-12 MED ORDER — ALVIMOPAN 12 MG PO CAPS
12.0000 mg | ORAL_CAPSULE | ORAL | Status: AC
  Administered 2022-02-12: 12 mg via ORAL
  Filled 2022-02-12: qty 1

## 2022-02-12 MED ORDER — ONDANSETRON HCL 4 MG/2ML IJ SOLN
INTRAMUSCULAR | Status: AC
  Filled 2022-02-12: qty 2

## 2022-02-12 MED ORDER — OXYCODONE HCL 5 MG/5ML PO SOLN: 5.0000 mg | Freq: Once | ORAL | Status: DC | PRN

## 2022-02-12 MED ORDER — PHENYLEPHRINE HCL (PRESSORS) 10 MG/ML IV SOLN
INTRAVENOUS | Status: AC
  Filled 2022-02-12: qty 1

## 2022-02-12 MED ORDER — ONDANSETRON HCL 4 MG/2ML IJ SOLN: 4.0000 mg | Freq: Once | INTRAMUSCULAR | Status: DC | PRN

## 2022-02-12 MED ORDER — SODIUM CHLORIDE 0.9 % IV SOLN
2.0000 g | Freq: Once | INTRAVENOUS | Status: AC
  Administered 2022-02-13: 2 g via INTRAVENOUS
  Filled 2022-02-12: qty 2

## 2022-02-12 MED ORDER — BUPIVACAINE-EPINEPHRINE (PF) 0.25% -1:200000 IJ SOLN
INTRAMUSCULAR | Status: AC
Start: 2022-02-12 — End: ?
  Filled 2022-02-12: qty 30

## 2022-02-12 MED ORDER — LACTATED RINGERS IV SOLN: INTRAVENOUS | Status: DC | PRN

## 2022-02-12 MED ORDER — LIDOCAINE HCL (PF) 2 % IJ SOLN
INTRAMUSCULAR | Status: DC | PRN
  Administered 2022-02-12: 1.5 mg/kg/h

## 2022-02-12 MED ORDER — MIDAZOLAM HCL 5 MG/5ML IJ SOLN
INTRAMUSCULAR | Status: DC | PRN
  Administered 2022-02-12: 2 mg via INTRAVENOUS

## 2022-02-12 MED ORDER — HYDROMORPHONE HCL 1 MG/ML IJ SOLN
0.5000 mg | INTRAMUSCULAR | Status: DC | PRN
  Administered 2022-02-12: 0.5 mg via INTRAVENOUS
  Filled 2022-02-12: qty 0.5

## 2022-02-12 MED ORDER — HYDROMORPHONE HCL 1 MG/ML IJ SOLN
INTRAMUSCULAR | Status: AC
  Filled 2022-02-12: qty 2

## 2022-02-12 MED ORDER — LIDOCAINE HCL (CARDIAC) PF 100 MG/5ML IV SOSY
PREFILLED_SYRINGE | INTRAVENOUS | Status: DC | PRN
  Administered 2022-02-12: 100 mg via INTRAVENOUS

## 2022-02-12 MED ORDER — PHENYLEPHRINE HCL-NACL 20-0.9 MG/250ML-% IV SOLN
INTRAVENOUS | Status: DC | PRN
  Administered 2022-02-12: 30 ug/min via INTRAVENOUS

## 2022-02-12 MED ORDER — POLYETHYLENE GLYCOL 3350 17 GM/SCOOP PO POWD: 1.0000 | Freq: Once | ORAL | Status: DC

## 2022-02-12 MED ORDER — KETOROLAC TROMETHAMINE 30 MG/ML IJ SOLN
30.0000 mg | Freq: Once | INTRAMUSCULAR | Status: DC | PRN
Start: 2022-02-12 — End: 2022-02-12

## 2022-02-12 MED ORDER — GABAPENTIN 300 MG PO CAPS
300.0000 mg | ORAL_CAPSULE | ORAL | Status: AC
  Administered 2022-02-12: 300 mg via ORAL
  Filled 2022-02-12: qty 1

## 2022-02-12 MED ORDER — DIPHENHYDRAMINE HCL 50 MG/ML IJ SOLN
12.5000 mg | Freq: Four times a day (QID) | INTRAMUSCULAR | Status: DC | PRN
  Administered 2022-02-13: 12.5 mg via INTRAVENOUS
  Filled 2022-02-12: qty 1

## 2022-02-12 MED ORDER — ORAL CARE MOUTH RINSE: 15.0000 mL | Freq: Once | OROMUCOSAL | Status: AC

## 2022-02-12 MED ORDER — BISACODYL 5 MG PO TBEC: 20.0000 mg | DELAYED_RELEASE_TABLET | Freq: Once | ORAL | Status: DC

## 2022-02-12 MED ORDER — ALBUTEROL SULFATE HFA 108 (90 BASE) MCG/ACT IN AERS
2.0000 | INHALATION_SPRAY | Freq: Four times a day (QID) | RESPIRATORY_TRACT | Status: DC | PRN
Start: 2022-02-12 — End: 2022-02-12

## 2022-02-12 MED ORDER — ALBUTEROL SULFATE (2.5 MG/3ML) 0.083% IN NEBU: 2.5000 mg | INHALATION_SOLUTION | Freq: Four times a day (QID) | RESPIRATORY_TRACT | Status: DC | PRN

## 2022-02-12 MED ORDER — PROPOFOL 10 MG/ML IV BOLUS
INTRAVENOUS | Status: DC | PRN
  Administered 2022-02-12: 200 mg via INTRAVENOUS

## 2022-02-12 MED ORDER — ACETAMINOPHEN 500 MG PO TABS
1000.0000 mg | ORAL_TABLET | Freq: Four times a day (QID) | ORAL | Status: DC
  Administered 2022-02-12 – 2022-02-14 (×5): 1000 mg via ORAL
  Filled 2022-02-12 (×6): qty 2

## 2022-02-12 MED ORDER — ENOXAPARIN SODIUM 40 MG/0.4ML IJ SOSY
40.0000 mg | PREFILLED_SYRINGE | INTRAMUSCULAR | Status: DC
  Administered 2022-02-13 – 2022-02-14 (×2): 40 mg via SUBCUTANEOUS
  Filled 2022-02-12 (×2): qty 0.4

## 2022-02-12 MED ORDER — BUPIVACAINE LIPOSOME 1.3 % IJ SUSP
INTRAMUSCULAR | Status: AC
  Filled 2022-02-12: qty 20

## 2022-02-12 MED ORDER — EPHEDRINE 5 MG/ML INJ
INTRAVENOUS | Status: AC
  Filled 2022-02-12: qty 5

## 2022-02-12 MED ORDER — SODIUM CHLORIDE 0.9 % IR SOLN
Status: DC | PRN
  Administered 2022-02-12: 1000 mL via INTRAVESICAL

## 2022-02-12 MED ORDER — DEXAMETHASONE SODIUM PHOSPHATE 10 MG/ML IJ SOLN
INTRAMUSCULAR | Status: DC | PRN
  Administered 2022-02-12: 10 mg via INTRAVENOUS

## 2022-02-12 MED ORDER — ONDANSETRON HCL 4 MG PO TABS: 4.0000 mg | ORAL_TABLET | Freq: Four times a day (QID) | ORAL | Status: DC | PRN

## 2022-02-12 MED ORDER — DIPHENHYDRAMINE HCL 12.5 MG/5ML PO ELIX: 12.5000 mg | ORAL_SOLUTION | Freq: Four times a day (QID) | ORAL | Status: DC | PRN

## 2022-02-12 MED ORDER — BUPIVACAINE LIPOSOME 1.3 % IJ SUSP: 20.0000 mL | Freq: Once | INTRAMUSCULAR | Status: DC

## 2022-02-12 MED ORDER — PHENYLEPHRINE 80 MCG/ML (10ML) SYRINGE FOR IV PUSH (FOR BLOOD PRESSURE SUPPORT)
PREFILLED_SYRINGE | INTRAVENOUS | Status: AC
  Filled 2022-02-12: qty 10

## 2022-02-12 MED ORDER — METOPROLOL SUCCINATE ER 25 MG PO TB24
25.0000 mg | ORAL_TABLET | Freq: Every day | ORAL | Status: DC
  Administered 2022-02-13 – 2022-02-14 (×2): 25 mg via ORAL
  Filled 2022-02-12 (×2): qty 1

## 2022-02-12 MED ORDER — FENTANYL CITRATE (PF) 250 MCG/5ML IJ SOLN
INTRAMUSCULAR | Status: AC
  Filled 2022-02-12: qty 5

## 2022-02-12 MED ORDER — SUGAMMADEX SODIUM 500 MG/5ML IV SOLN
INTRAVENOUS | Status: DC | PRN
  Administered 2022-02-12: 400 mg via INTRAVENOUS

## 2022-02-12 MED ORDER — ENSURE SURGERY PO LIQD
237.0000 mL | Freq: Two times a day (BID) | ORAL | Status: DC
  Administered 2022-02-13 – 2022-02-14 (×3): 237 mL via ORAL

## 2022-02-12 MED ORDER — SODIUM CHLORIDE (PF) 0.9 % IJ SOLN
INTRAMUSCULAR | Status: AC
Start: 2022-02-12 — End: ?
  Filled 2022-02-12: qty 10

## 2022-02-12 MED ORDER — LACTATED RINGERS IR SOLN
Status: DC | PRN
  Administered 2022-02-12 (×2): 1000 mL

## 2022-02-12 MED ORDER — KETAMINE HCL 10 MG/ML IJ SOLN
INTRAMUSCULAR | Status: DC | PRN
  Administered 2022-02-12: 10 mg via INTRAVENOUS
  Administered 2022-02-12 (×2): 15 mg via INTRAVENOUS

## 2022-02-12 MED ORDER — ENSURE PRE-SURGERY PO LIQD
592.0000 mL | Freq: Once | ORAL | Status: DC
  Filled 2022-02-12: qty 592

## 2022-02-12 MED ORDER — MIDAZOLAM HCL 2 MG/2ML IJ SOLN
INTRAMUSCULAR | Status: AC
  Filled 2022-02-12: qty 2

## 2022-02-12 SURGICAL SUPPLY — 130 items
ADAPTER GOLDBERG URETERAL (ADAPTER) IMPLANT
ADH SKN CLS APL DERMABOND .7 (GAUZE/BANDAGES/DRESSINGS) ×1
ADPR CATH 15X14FR FL DRN BG (ADAPTER)
APL SKNCLS STERI-STRIP NONHPOA (GAUZE/BANDAGES/DRESSINGS) ×1
BAG COUNTER SPONGE SURGICOUNT (BAG) ×2 IMPLANT
BAG SPNG CNTER NS LX DISP (BAG) ×1
BAG URO CATCHER STRL LF (MISCELLANEOUS) ×2 IMPLANT
BENZOIN TINCTURE PRP APPL 2/3 (GAUZE/BANDAGES/DRESSINGS) ×2 IMPLANT
BLADE EXTENDED COATED 6.5IN (ELECTRODE) IMPLANT
BLADE HEX COATED 2.75 (ELECTRODE) ×2 IMPLANT
CANNULA REDUC XI 12-8 STAPL (CANNULA)
CANNULA REDUCER 12-8 DVNC XI (CANNULA) IMPLANT
CATH URETL OPEN END 6FR 70 (CATHETERS) IMPLANT
CELLS DAT CNTRL 66122 CELL SVR (MISCELLANEOUS) IMPLANT
CLOTH BEACON ORANGE TIMEOUT ST (SAFETY) ×2 IMPLANT
COVER SURGICAL LIGHT HANDLE (MISCELLANEOUS) ×4 IMPLANT
COVER TIP SHEARS 8 DVNC (MISCELLANEOUS) ×1 IMPLANT
COVER TIP SHEARS 8MM DA VINCI (MISCELLANEOUS) ×2
DERMABOND ADVANCED (GAUZE/BANDAGES/DRESSINGS) ×1
DERMABOND ADVANCED .7 DNX12 (GAUZE/BANDAGES/DRESSINGS) IMPLANT
DRAIN CHANNEL 19F RND (DRAIN) IMPLANT
DRAIN CHANNEL RND F F (WOUND CARE) IMPLANT
DRAPE ARM DVNC X/XI (DISPOSABLE) ×4 IMPLANT
DRAPE COLUMN DVNC XI (DISPOSABLE) ×1 IMPLANT
DRAPE DA VINCI XI ARM (DISPOSABLE) ×8
DRAPE DA VINCI XI COLUMN (DISPOSABLE) ×2
DRAPE LAPAROTOMY TRNSV 102X78 (DRAPES) ×2 IMPLANT
DRAPE SURG IRRIG POUCH 19X23 (DRAPES) ×2 IMPLANT
DRSG OPSITE POSTOP 3X4 (GAUZE/BANDAGES/DRESSINGS) ×1 IMPLANT
DRSG OPSITE POSTOP 4X10 (GAUZE/BANDAGES/DRESSINGS) IMPLANT
DRSG OPSITE POSTOP 4X6 (GAUZE/BANDAGES/DRESSINGS) IMPLANT
DRSG OPSITE POSTOP 4X8 (GAUZE/BANDAGES/DRESSINGS) IMPLANT
ELECT PENCIL ROCKER SW 15FT (MISCELLANEOUS) ×2 IMPLANT
ELECT REM PT RETURN 15FT ADLT (MISCELLANEOUS) ×2 IMPLANT
ENDOLOOP SUT PDS II  0 18 (SUTURE)
ENDOLOOP SUT PDS II 0 18 (SUTURE) IMPLANT
EVACUATOR DRAINAGE 10X20 100CC (DRAIN) IMPLANT
EVACUATOR SILICONE 100CC (DRAIN) IMPLANT
GAUZE SPONGE 4X4 12PLY STRL (GAUZE/BANDAGES/DRESSINGS) IMPLANT
GLOVE BIO SURGEON STRL SZ 6.5 (GLOVE) ×6 IMPLANT
GLOVE BIOGEL PI IND STRL 7.0 (GLOVE) ×2 IMPLANT
GLOVE BIOGEL PI INDICATOR 7.0 (GLOVE) ×2
GLOVE INDICATOR 6.5 STRL GRN (GLOVE) ×2 IMPLANT
GLOVE SURG LX 7.5 STRW (GLOVE) ×1
GLOVE SURG LX STRL 7.5 STRW (GLOVE) ×1 IMPLANT
GOWN L4 XXLG W/PAP TWL (GOWN DISPOSABLE) IMPLANT
GOWN SRG XL LVL 4 BRTHBL STRL (GOWNS) ×1 IMPLANT
GOWN STRL NON-REIN XL LVL4 (GOWNS) ×2
GOWN STRL REUS W/ TWL XL LVL3 (GOWN DISPOSABLE) ×3 IMPLANT
GOWN STRL REUS W/TWL XL LVL3 (GOWN DISPOSABLE) ×6
GRASPER SUT TROCAR 14GX15 (MISCELLANEOUS) IMPLANT
GUIDEWIRE ANG ZIPWIRE 038X150 (WIRE) IMPLANT
GUIDEWIRE STR DUAL SENSOR (WIRE) IMPLANT
HANDLE SUCTION POOLE (INSTRUMENTS) IMPLANT
HOLDER FOLEY CATH W/STRAP (MISCELLANEOUS) ×2 IMPLANT
IRRIG SUCT STRYKERFLOW 2 WTIP (MISCELLANEOUS) ×2
IRRIGATION SUCT STRKRFLW 2 WTP (MISCELLANEOUS) ×1 IMPLANT
KIT BASIN OR (CUSTOM PROCEDURE TRAY) ×2 IMPLANT
KIT PROCEDURE DA VINCI SI (MISCELLANEOUS) ×2
KIT PROCEDURE DVNC SI (MISCELLANEOUS) ×1 IMPLANT
KIT TURNOVER KIT A (KITS) IMPLANT
MANIFOLD NEPTUNE II (INSTRUMENTS) ×2 IMPLANT
NDL INSUFFLATION 14GA 120MM (NEEDLE) ×1 IMPLANT
NEEDLE INSUFFLATION 14GA 120MM (NEEDLE) ×2 IMPLANT
NS IRRIG 1000ML POUR BTL (IV SOLUTION) ×2 IMPLANT
PACK CARDIOVASCULAR III (CUSTOM PROCEDURE TRAY) ×2 IMPLANT
PACK COLON (CUSTOM PROCEDURE TRAY) ×2 IMPLANT
PACK CYSTO (CUSTOM PROCEDURE TRAY) ×2 IMPLANT
PACK GENERAL/GYN (CUSTOM PROCEDURE TRAY) ×2 IMPLANT
PAD POSITIONING PINK XL (MISCELLANEOUS) ×2 IMPLANT
RELOAD STAPLE 60 3.5 BLU DVNC (STAPLE) IMPLANT
RELOAD STAPLE 60 4.3 GRN DVNC (STAPLE) IMPLANT
RELOAD STAPLER 3.5X60 BLU DVNC (STAPLE) ×2 IMPLANT
RELOAD STAPLER 4.3X60 GRN DVNC (STAPLE) IMPLANT
RETRACTOR WND ALEXIS 18 MED (MISCELLANEOUS) IMPLANT
RTRCTR WOUND ALEXIS 18CM MED (MISCELLANEOUS)
SCISSORS LAP 5X35 DISP (ENDOMECHANICALS) IMPLANT
SEAL CANN UNIV 5-8 DVNC XI (MISCELLANEOUS) ×3 IMPLANT
SEAL XI 5MM-8MM UNIVERSAL (MISCELLANEOUS) ×6
SEALER VESSEL DA VINCI XI (MISCELLANEOUS) ×2
SEALER VESSEL EXT DVNC XI (MISCELLANEOUS) ×1 IMPLANT
SOLUTION ELECTROLUBE (MISCELLANEOUS) ×2 IMPLANT
SPIKE FLUID TRANSFER (MISCELLANEOUS) IMPLANT
STAPLER 60 DA VINCI SURE FORM (STAPLE) ×2
STAPLER 60 SUREFORM DVNC (STAPLE) IMPLANT
STAPLER CANNULA SEAL DVNC XI (STAPLE) IMPLANT
STAPLER CANNULA SEAL XI (STAPLE)
STAPLER ECHELON POWER CIR 29 (STAPLE) ×1 IMPLANT
STAPLER ECHELON POWER CIR 31 (STAPLE) IMPLANT
STAPLER RELOAD 3.5X60 BLU DVNC (STAPLE) ×2
STAPLER RELOAD 3.5X60 BLUE (STAPLE) ×4
STAPLER RELOAD 4.3X60 GREEN (STAPLE)
STAPLER RELOAD 4.3X60 GRN DVNC (STAPLE)
STAPLER VISISTAT 35W (STAPLE) ×2 IMPLANT
STOPCOCK 4 WAY LG BORE MALE ST (IV SETS) ×4 IMPLANT
STRIP CLOSURE SKIN 1/2X4 (GAUZE/BANDAGES/DRESSINGS) ×2 IMPLANT
SUCTION POOLE HANDLE (INSTRUMENTS)
SUT ETHILON 2 0 PS N (SUTURE) IMPLANT
SUT ETHILON 4 0 PS 2 18 (SUTURE) IMPLANT
SUT NOVA NAB DX-16 0-1 5-0 T12 (SUTURE) ×4 IMPLANT
SUT NOVA NAB GS-21 1 T12 (SUTURE) ×2 IMPLANT
SUT PROLENE 2 0 KS (SUTURE) IMPLANT
SUT SILK 2 0 (SUTURE) ×2
SUT SILK 2 0 SH (SUTURE) IMPLANT
SUT SILK 2 0 SH CR/8 (SUTURE) IMPLANT
SUT SILK 2-0 18XBRD TIE 12 (SUTURE) ×1 IMPLANT
SUT SILK 3 0 (SUTURE)
SUT SILK 3 0 SH 30 (SUTURE) IMPLANT
SUT SILK 3 0 SH CR/8 (SUTURE) ×2 IMPLANT
SUT SILK 3-0 18XBRD TIE 12 (SUTURE) IMPLANT
SUT V-LOC BARB 180 2/0GR6 GS22 (SUTURE)
SUT VIC AB 2-0 SH 18 (SUTURE) IMPLANT
SUT VIC AB 2-0 SH 27 (SUTURE)
SUT VIC AB 2-0 SH 27X BRD (SUTURE) IMPLANT
SUT VIC AB 3-0 SH 18 (SUTURE) IMPLANT
SUT VIC AB 4-0 PS2 27 (SUTURE) ×4 IMPLANT
SUT VICRYL 0 UR6 27IN ABS (SUTURE) ×2 IMPLANT
SUTURE V-LC BRB 180 2/0GR6GS22 (SUTURE) IMPLANT
SYR 10ML ECCENTRIC (SYRINGE) ×2 IMPLANT
SYS LAPSCP GELPORT 120MM (MISCELLANEOUS)
SYS WOUND ALEXIS 18CM MED (MISCELLANEOUS)
SYSTEM LAPSCP GELPORT 120MM (MISCELLANEOUS) IMPLANT
SYSTEM WOUND ALEXIS 18CM MED (MISCELLANEOUS) IMPLANT
TOWEL OR 17X26 10 PK STRL BLUE (TOWEL DISPOSABLE) IMPLANT
TOWEL OR NON WOVEN STRL DISP B (DISPOSABLE) ×2 IMPLANT
TRAY FOLEY MTR SLVR 16FR STAT (SET/KITS/TRAYS/PACK) ×2 IMPLANT
TROCAR ADV FIXATION 5X100MM (TROCAR) ×2 IMPLANT
TUBING CONNECTING 10 (TUBING) ×6 IMPLANT
TUBING INSUFFLATION 10FT LAP (TUBING) ×2 IMPLANT
TUBING UROLOGY SET (TUBING) IMPLANT

## 2022-02-12 NOTE — Op Note (Signed)
02/12/2022  3:03 PM  PATIENT:  [name]  66 y.o. male  Patient Care Team: [name], MD as PCP - General (Family Medicine) Harl Bowie, Alphonse Guild, MD as PCP - Cardiology (Cardiology) Gala Romney [name], MD as Consulting Physician (Gastroenterology)  PRE-OPERATIVE DIAGNOSIS:  DIVERTICULAR DISEASE  POST-OPERATIVE DIAGNOSIS:  DIVERTICULAR DISEASE  PROCEDURE:  XI ROBOT ASSISTED LAPAROSCOPIC LOW ANTERIOR RESECTION AND APPENDECTOMY CYSTOSCOPY with FIREFLY INJECTION   Surgeon(s): Leighton Ruff, MD Michael Boston, MD Lucas Mallow, MD  ASSISTANT: Dr Johney Maine   ANESTHESIA:   local and general  EBL:  129m Total I/O In: 1500 [I.V.:1400; IV Piggyback:100] Out: 700 [Urine:600; Blood:100]  Delay start of Pharmacological VTE agent (>24hrs) due to surgical blood loss or risk of bleeding:  no  DRAINS: none   SPECIMEN:  Source of Specimen:  rectosigmoid colon  DISPOSITION OF SPECIMEN:  PATHOLOGY  COUNTS:  YES  PLAN OF CARE: Admit to inpatient   PATIENT DISPOSITION:  PACU - hemodynamically stable.  INDICATION:    Patient with complicated diverticular disease with history of right sided abscess.  I recommended segmental resection alon with appendectomy just in case the appendix was involved:  The anatomy & physiology of the digestive tract was discussed.  The pathophysiology was discussed.  Natural history risks without surgery was discussed.   I worked to give an overview of the disease and the frequent need to have multispecialty involvement.  I feel the risks of no intervention will lead to serious problems that outweigh the operative risks; therefore, I recommended a partial colectomy to remove the pathology.  Laparoscopic & open techniques were discussed.   Risks such as bleeding, infection, abscess, leak, reoperation, possible ostomy, hernia, heart attack, death, and other risks were discussed.  I noted a good likelihood this will help address the problem.   Goals of  post-operative recovery were discussed as well.    The patient expressed understanding & wished to proceed with surgery.  OR FINDINGS:   Patient had significant R sided inflammation at the pelvic brim.  The anastomosis rests ~12 cm from the anal verge by rigid proctoscopy.  DESCRIPTION:   Informed consent was confirmed.  The patient underwent general anaesthesia without difficulty.  The patient was positioned appropriately.  VTE prevention in place.  The patient's abdomen was clipped, prepped, & draped in a sterile fashion.  Surgical timeout confirmed our plan.  The patient was positioned in reverse Trendelenburg.  Abdominal entry was gained using a Varies needle in the LUQ.  Entry was clean.  I induced carbon dioxide insufflation.  An 841mrobotic port was placed in the RUQ.  Camera inspection revealed no injury.  Extra ports were carefully placed under direct laparoscopic visualization.  I laparoscopically reflected the greater omentum and the upper abdomen the small bowel in the upper abdomen. The patient was appropriately positioned and the robot was docked to the patient's left side.  Instruments were placed under direct visualization.    I mobilized the sigmoid colon off of the right pelvic sidewall.  I scored the base of peritoneum of the right side of the mesentery of the left colon from the ligament of Treitz to the peritoneal reflection of the mid rectum.  The patient had significant inflammation of his entire sigmoid.  I elevated the sigmoid mesentery and enetered into the retro-mesenteric plane. We were able to identify the left ureter and gonadal vessels. We kept those posterior within the retroperitoneum and elevated the left colon mesentery off that.  I did isolated IMA pedicle but did not ligate it yet.  I continued distally and got into the avascular plane posterior to the mesorectum. This allowed me to help mobilize the rectum as well by freeing the mesorectum off the sacrum.  I  mobilized the peritoneal coverings towards the peritoneal reflection on both the right and left sides of the rectum.  I could see the right and left ureters and stayed away from them.    I skeletonized the inferior mesenteric artery pedicle.   After confirming the left ureter was out of the way, I went ahead and ligated the inferior mesenteric artery pedicle with bipolar robotic vessel sealer well above its takeoff from the aorta.  We ensured hemostasis. I skeletonized the mesorectum at the junction at the proximal rectum using blunt dissection & bipolar robotic vessel sealer.  I mobilized the left colon in a lateral to medial fashion off the line of Toldt up towards the splenic flexure to ensure good mobilization of the left colon to reach into the pelvis.  I divided the mesentery to the level of the descending colon using the robotic vessel sealer.  Given the appendix was noted to be inside the area of inflammation, I decided to remove this as well.  I then divided the mesoappendix also using the robotic vessel sealer.  The rectosigmoid was transected using a blue load 60 mm robotic stapler.  A second 60 mm blue load was used to transect the appendix and cecal cuff.  At this point the robot was undocked.  The suprapubic port was enlarged into a small Pfannenstiel incision and an [place] wound protector was placed.  The colon and appendix were removed.  The colon was transected over a pursestring device.  A 2-0 Prolene pursestring was placed.  This was secured with 3-0 silk sutures.  A 29 mm EEA anvil was introduced and the pursestring was tied tightly around this.  Upon evaluating the colon there appeared to be a large diverticulum that was sitting on the anvil bed.  This was carefully dissected away from the colon wall using blunt dissection.  This was then secured up inside of the pursestring and divided using electrocautery.  This was then placed back into the abdomen.  An anastomosis was created through the  rectal stump under laparoscopic visualization.  There was no tension noted on the anastomosis.  There was no leak when tested with irrigation under insufflation.  The anastomosis rest approximately 12 cm from the sphincter complex.   We then switched to clean gowns, gloves, instruments and drapes.  The Pfannenstiel incision peritoneum was closed using a 0 Vicryl running suture.  The fascia was closed using a running #1 Novafil sutures.  Subcutaneous tissue was reapproximated using a 2-0 Vicryl suture in a running fashion.  The skin was closed using a running 4-0 Vicryl subcuticular suture.  A sterile dressing was applied.  The remaining port sites were closed using interrupted 4-0 Vicryl sutures and Dermabond.  The patient was awakened from anesthesia and sent to the postanesthesia care unit in stable condition.  All counts were correct per operating room staff.   An MD assistant was necessary for tissue manipulation, retraction and positioning due to the complexity of the case and hospital policies   Rosario Adie, MD  [facility]

## 2022-02-12 NOTE — Transfer of Care (Signed)
Immediate Anesthesia Transfer of Care Note  Patient: [name]  Procedure(s) Performed: XI ROBOT ASSISTED LAPAROSCOPIC LOW ANTERIOR RESECTION AND APPENDECTOMY CYSTOSCOPY with FIREFLY INJECTION  Patient Location: PACU  Anesthesia Type:General  Level of Consciousness: awake, alert , oriented and patient cooperative  Airway & Oxygen Therapy: Patient Spontanous Breathing and Patient connected to face mask oxygen  Post-op Assessment: Report given to RN, Post -op Vital signs reviewed and stable and Patient moving all extremities X 4  Post vital signs: Reviewed and stable  Last Vitals:  Vitals Value Taken Time  BP    Temp    Pulse    Resp    SpO2      Last Pain:  Vitals:   02/12/22 1023  TempSrc:   PainSc: 0-No pain         Complications: No notable events documented.

## 2022-02-12 NOTE — H&P (Signed)
H&P Physician requesting consult: Leighton Ruff  Chief Complaint: Complicated diverticulitis  History of Present Illness: 66 year old male with a history of complicated diverticulitis treated with antibiotics and a abscess drained in February.  He presents for prophylactic partial colectomy.  Intraoperative firefly instillation into the ureters was requested.  Past Medical History:  Diagnosis Date   Bilateral shoulder bursitis    Chronic back pain    COPD (chronic obstructive pulmonary disease) with emphysema ([name])    DDD (degenerative disc disease), lumbar    hx epideral injection  L5--S1    Diverticulitis    DVT (deep venous thrombosis) ([place]) 11/19/2018   Dysphasia    intermittant w/ food    Dyspnea    Dyspnea on exertion    Fatty liver    GERD (gastroesophageal reflux disease)    Gout    L great toe   Heterozygous factor V Leiden mutation ([place])    Hiatal hernia    Hip problem 2020   left, seeing orthopedics   History of acute pancreatitis    alcoholic pancreatitis [id number] and 01-30-2012. 2021   History of colon polyps    10-17-2005  hyperplastic polyp's   History of esophageal stricture    s/p  dilation 02-02-2017   History of peptic ulcer 1990s   History of pneumococcal pneumonia 2006   bilateral pneumonia w/ left lower lobe abscess treated w/ chest tube with suction for drainage   History of pneumothorax    1984--  left spontaneous pneumothorax ,  treated w/ chest tube   Hypertension    Malignant neoplasm of prostate [facility]) urologist-  dr dahlstedt/  oncologist -- dr Tammi Klippel   dx 12-02-2016-- Stage T2b, Gleason 3+4,  PSA 5.99,  vol 25cc   Neuropathy    Numbness of left foot    outer left ankle numb-- per pt has appt. w/ neurologist   OSA (obstructive sleep apnea)    per pt has not used cpap over year ago from 04-16-2017--- per study 01-17-2010  severe osa   Pneumonia    Solid nodule of lung greater than 8 mm in diameter 05/11/2018   03/2018: RUL Nodule, 56m    Wears glasses    Past Surgical History:  Procedure Laterality Date   BIOPSY  02/02/2017   Procedure: BIOPSY;  Surgeon: RDaneil Dolin MD;  [facility];  Service: Endoscopy;;  duodenal bx's, esophgeal bx's   BIOPSY  07/30/2017   Procedure: BIOPSY;  Surgeon: RDaneil Dolin MD;  [facility];  Service: Endoscopy;;  esophagus   BIOPSY  02/15/2021   Procedure: BIOPSY;  Surgeon: RDaneil Dolin MD;  [facility];  Service: Endoscopy;;   CARDIOVASCULAR STRESS TEST  09/12/2009   normal nuclear perfusion study w/ no ischemia /  normal LV function and wall motion , ef 57%   COLONOSCOPY  2007   Dr. RGala Romney hyperplastic polyps, internal hemorrhoids    COLONOSCOPY WITH PROPOFOL N/A 05/03/2018   Dr. RGala Romney diverticulosis in entire colon, multiple polyps in sigmoid, at splenic flexure, and ascending colon, not all polyps removed. Tubular adenomas and inflammatory polyps. Needs 3 year surveilance   COLONOSCOPY WITH PROPOFOL N/A 02/15/2021   Many polyps without adenomatous changes but all inflammatory in nature with mildly active colitis and chronic inflammatory mucosa.   EPIDURAL BLOCK INJECTION  2020   ESOPHAGOGASTRODUODENOSCOPY (EGD) WITH PROPOFOL N/A 02/02/2017   Dr. RGala Romney esophageal stenosis s/p dilation and biopsy, medium sized hiatal hernia, normal duodenum   ESOPHAGOGASTRODUODENOSCOPY (  EGD) WITH PROPOFOL N/A 07/30/2017   Dr. Gala Romney: esophageal stenosis s/p dilation and biopsy, medium-sized hiatal hernia, normal duodenum   HEMORRHOID SURGERY  01-21-2008    Forestine Na   HIP ARTHROPLASTY Bilateral    INTRAVASCULAR PRESSURE WIRE/FFR STUDY N/A 08/01/2021   Procedure: INTRAVASCULAR PRESSURE WIRE/FFR STUDY;  Surgeon: Burnell Blanks, MD;  Location: [facility];  Service: Cardiovascular;  Laterality: N/A;   IR RADIOLOGIST EVAL & MGMT  08/29/2020   LEFT HEART CATH AND CORONARY ANGIOGRAPHY N/A 08/01/2021   Procedure: LEFT HEART CATH AND CORONARY  ANGIOGRAPHY;  Surgeon: Burnell Blanks, MD;  Location: [place] [facility];  Service: Cardiovascular;  Laterality: N/A;   MALONEY DILATION N/A 02/02/2017   Procedure: Venia Minks DILATION;  Surgeon: Daneil Dolin, MD;  [facility];  Service: Endoscopy;  Laterality: N/A;   MALONEY DILATION N/A 07/30/2017   Procedure: Venia Minks DILATION;  Surgeon: Daneil Dolin, MD;  [facility];  Service: Endoscopy;  Laterality: N/A;   POLYPECTOMY  05/03/2018   Procedure: POLYPECTOMY;  Surgeon: Daneil Dolin, MD;  [facility];  Service: Endoscopy;;  ascending colon polyp, sigmoid colon polyps (multiple)   POLYPECTOMY  02/15/2021   Procedure: POLYPECTOMY;  Surgeon: Daneil Dolin, MD;  [facility];  Service: Endoscopy;;   RADIOACTIVE SEED IMPLANT N/A 04/23/2017   Procedure: RADIOACTIVE SEED IMPLANT/BRACHYTHERAPY IMPLANT;  Surgeon: Franchot Gallo, MD;  Location: [facility];  Service: Urology;  Laterality: N/A;   SHOULDER ARTHROSCOPY  08/05/2003   SPACE OAR INSTILLATION N/A 04/23/2017   Procedure: SPACE OAR INSTILLATION;  Surgeon: Franchot Gallo, MD;  Location: [facility];  Service: Urology;  Laterality: N/A;   TRANSTHORACIC ECHOCARDIOGRAM  03/28/2009   mild LVH, ef 55-60%/ mild aorta calcification   VASECTOMY  08/05/1983   VIDEO ASSISTED THORACOSCOPY (VATS)/DECORTICATION Left     Home Medications:  Medications Prior to Admission  Medication Sig Dispense Refill Last Dose   acetaminophen (TYLENOL) 500 MG tablet Take 2 tablets (1,000 mg total) by mouth every 6 (six) hours as needed for mild pain. 30 tablet 0 02/12/2022 at 0700   albuterol (PROVENTIL) (2.5 MG/3ML) 0.083% nebulizer solution Take 2.5 mg by nebulization every 6 (six) hours as needed for wheezing or shortness of breath.   02/12/2022 at 0700   allopurinol (ZYLOPRIM) 300 MG tablet Take 300 mg by mouth daily.    at 0700   apixaban (ELIQUIS) 5 MG TABS tablet Take  1 tablet (5 mg total) by mouth 2 (two) times daily. 60 tablet 3 02/09/2022 at 2000   doxycycline (VIBRAMYCIN) 50 MG capsule Take 50 mg by mouth daily.   02/12/2022 at 0700   Fluticasone-Umeclidin-Vilant (TRELEGY ELLIPTA) 100-62.5-25 MCG/ACT AEPB Inhale 1 puff into the lungs daily. 60 each 5 02/12/2022 at 0700   metoprolol succinate (TOPROL XL) 25 MG 24 hr tablet Take 1 tablet (25 mg total) by mouth daily. 90 tablet 3 02/12/2022 at 0700   Multiple Vitamin (MULTIVITAMIN WITH MINERALS) TABS tablet Take 1 tablet by mouth daily.   02/11/2022   NON FORMULARY Pt uses a cpap nightly      polyethylene glycol powder (GLYCOLAX/MIRALAX) 17 GM/SCOOP powder Take 8.5 g by mouth daily.   02/11/2022   Propylene Glycol-Glycerin (SOOTHE OP) Place 1 drop into both eyes 2 (two) times daily as needed (dry eyes).   02/11/2022   rosuvastatin (CRESTOR) 20 MG tablet Take 1 tablet (20 mg total) by mouth daily. 90 tablet 3  02/12/2022 at 0700   albuterol (VENTOLIN HFA) 108 (90 Base) MCG/ACT inhaler Inhale 2 puffs into the lungs every 6 (six) hours as needed for wheezing or shortness of breath.   More than a month   metroNIDAZOLE (FLAGYL) 500 MG tablet       Allergies: No Known Allergies  Family History  Problem Relation Age of Onset   Diabetes Mother    Heart disease Father    Heart disease Brother    Cancer Neg Hx    Colon cancer Neg Hx    Colon polyps Neg Hx    Neuropathy Neg Hx    Social History:  reports that he quit smoking about 6 years ago. His smoking use included cigarettes and e-cigarettes. He has a 30.00 pack-year smoking history. He has been exposed to tobacco smoke. He has never used smokeless tobacco. He reports current alcohol use of about 5.0 standard drinks of alcohol per week. He reports that he does not use drugs.  ROS: A complete review of systems was performed.  All systems are negative except for pertinent findings as noted. ROS   Physical Exam:  Vital signs in last 24 hours: Temp:  [97.8 F (36.6  C)] 97.8 F (36.6 C) (07/12 1016) Pulse Rate:  [74] 74 (07/12 1016) Resp:  [14] 14 (07/12 1016) BP: (140)/(82) 140/82 (07/12 1016) SpO2:  [95 %] 95 % (07/12 1016) General:  Alert and oriented, No acute distress HEENT: Normocephalic, atraumatic Neck: No JVD or lymphadenopathy Cardiovascular: Regular rate and rhythm Lungs: Regular rate and effort Abdomen: Soft, nontender, nondistended, no abdominal masses Back: No CVA tenderness Extremities: No edema Neurologic: Grossly intact  Laboratory Data:  Results for orders placed or performed during the hospital encounter of 02/12/22 (from the past 24 hour(s))  ABO/Rh     Status: None   Collection Time: 02/12/22 10:35 AM  Result Value Ref Range   ABO/RH(D)      O POS Performed at [facility], [place] [address], [place], [place] [address]    No results found for this or any previous visit (from the past 240 hour(s)). Creatinine: No results for input(s): "CREATININE" in the last 168 hours.  Impression/Assessment:  Complicated diverticulitis  Plan:  Proceed with cystoscopy with bilateral ureteral catheterization with bilateral retrograde instillation of firefly  Marton Redwood, III 02/12/2022, 1:07 PM

## 2022-02-12 NOTE — Anesthesia Postprocedure Evaluation (Signed)
Anesthesia Post Note  Patient: [name]  Procedure(s) Performed: XI ROBOT ASSISTED LAPAROSCOPIC LOW ANTERIOR RESECTION AND APPENDECTOMY CYSTOSCOPY with FIREFLY INJECTION     Patient location during evaluation: PACU Anesthesia Type: General Level of consciousness: awake and alert Pain management: pain level controlled Vital Signs Assessment: post-procedure vital signs reviewed and stable Respiratory status: spontaneous breathing, nonlabored ventilation, respiratory function stable and patient connected to nasal cannula oxygen Cardiovascular status: blood pressure returned to baseline and stable Postop Assessment: no apparent nausea or vomiting Anesthetic complications: no   No notable events documented.  Last Vitals:  Vitals:   02/12/22 1630 02/12/22 1705  BP: (!) 151/89 (!) 155/107  Pulse: 69 84  Resp: 17 16  Temp: (!) 36.4 C (!) 36.3 C  SpO2: 98% 92%    Last Pain:  Vitals:   02/12/22 1630  TempSrc:   PainSc: Asleep                 Junaid Wurzer S

## 2022-02-12 NOTE — H&P (Signed)
REFERRING PHYSICIAN:  Neil Crouch, PA   PROVIDER:  Monico Blitz, MD   MRN: [id number] DOB: June 03, 1956 DATE OF ENCOUNTER: 12/23/2021   Subjective    Chief Complaint: New Consultation       History of Present Illness: [name] is a 66 y.o. male who is seen today as an office consultation at the request of Dr. Bobby Rumpf for evaluation of New Consultation .  66 year old male with a history of complicated diverticulitis with need for inpatient admission with IV antibiotics and abscess drain in February 2023.  Patient with a history of coronary artery disease and COPD who was admitted in January 2023 for bilateral pulmonary embolism.  He underwent treatment for this and then was admitted to the hospital with diverticulitis in late February.  This was controlled with IV antibiotics and a drain placed in the right lower quadrant.  He has subsequently recovered for this but continues to have occasional episodes of pain.  Last colonoscopy was performed in the fall of last year.  No malignancies were noted.  Patient has no past surgical history within his abdomen.  He is also undergoing work-up of some left leg pain that occurs when he lifts his leg.     Review of Systems: A complete review of systems was obtained from the patient.  I have reviewed this information and discussed as appropriate with the patient.  See HPI as well for other ROS.     Medical History: Past Medical History         Past Medical History:  Diagnosis Date   COPD (chronic obstructive pulmonary disease) (CMS-[name])     DVT (deep venous thrombosis) (CMS-[name])     History of cancer     Pulmonary hypertension (CMS-[name])     Sleep apnea          There is no problem list on file for this patient.     Past Surgical History           Past Surgical History:  Procedure Laterality Date   JOINT REPLACEMENT            Allergies  No Known Allergies                 Current Outpatient Medications on File  Prior to Visit  Medication Sig Dispense Refill   allopurinoL (ZYLOPRIM) 300 MG tablet Take by mouth       apixaban (ELIQUIS) 5 mg tablet Take 5 mg by mouth 2 (two) times daily       metoprolol succinate (TOPROL-XL) 25 MG XL tablet Take 1 tablet by mouth once daily       rosuvastatin (CRESTOR) 20 MG tablet Take 20 mg by mouth once daily       albuterol 90 mcg/actuation inhaler Inhale into the lungs       doxycycline (VIBRAMYCIN) 50 MG capsule TAKE 1 CAPSULE BY MOUTH DAILY WITH FOOD       TRELEGY ELLIPTA 100-62.5-25 mcg inhaler Inhale 1 inhalation into the lungs once daily        No current facility-administered medications on file prior to visit.      Family History           Family History  Problem Relation Age of Onset   Obesity Mother     Diabetes Mother     Coronary Artery Disease (Blocked arteries around heart) Father          Social History  Tobacco Use  Smoking Status Former   Types: Cigarettes  Smokeless Tobacco Never      Social History  Social History             Socioeconomic History   Marital status: Married  Tobacco Use   Smoking status: Former      Types: Cigarettes   Smokeless tobacco: Never  Vaping Use   Vaping Use: Unknown  Substance and Sexual Activity   Alcohol use: Yes   Drug use: Never        Objective:      Vitals:   02/12/22 1016  BP: 140/82  Pulse: 74  Resp: 14  Temp: 97.8 F (36.6 C)  SpO2: 95%      Exam Gen: NAD CV: RRR Lungs: CTA Abd: soft        Labs, Imaging and Diagnostic Testing: CT scans from the past 6 months reviewed.  Patient had a significant inflammatory phlegmon in the right lower quadrant involving the terminal ileum and cecum.  I am able to visualize the appendix well.  This appears to be sitting over the right ureter as well.   Assessment and Plan:  Diagnoses and all orders for this visit:   Diverticular disease     66 year old male with COPD and history of pulmonary embolism who  presented to the office for evaluation of recurrent diverticulitis.  His last episode was complicated by abscess requiring drain.  He is now recovered from this and he is interested in pursuing a prophylactic partial colectomy.  It appears he has completed a 36-monthcourse of anticoagulation.  We have discussed this in detail with the patient.  All questions were answered.   The surgery and anatomy were described to the patient as well as the risks of surgery and the possible complications.  These include: Bleeding, deep abdominal infections and possible wound complications such as hernia and infection, damage to adjacent structures, leak of surgical connections, which can lead to other surgeries and possibly an ostomy, possible need for other procedures, such as abscess drains in radiology, possible prolonged hospital stay, possible diarrhea from removal of part of the colon, possible constipation from narcotics, possible bowel, bladder or sexual dysfunction if having rectal surgery, prolonged fatigue/weakness or appetite loss, possible early recurrence of of disease, possible complications of their medical problems such as heart disease or arrhythmias or lung problems, death (less than 1%). I believe the patient understands and wishes to proceed with the surgery.     ARosario Adie MD   [facility]

## 2022-02-12 NOTE — Anesthesia Procedure Notes (Signed)
Procedure Name: Intubation Date/Time: 02/12/2022 12:48 PM  Performed by: Jonna Munro, CRNAPre-anesthesia Checklist: Patient identified, Emergency Drugs available, Suction available, Patient being monitored and Timeout performed Patient Re-evaluated:Patient Re-evaluated prior to induction Oxygen Delivery Method: Circle system utilized Preoxygenation: Pre-oxygenation with 100% oxygen Induction Type: IV induction Ventilation: Mask ventilation without difficulty Laryngoscope Size: Mac and 4 Grade View: Grade I Tube type: Oral Tube size: 7.5 mm Number of attempts: 1 Airway Equipment and Method: Stylet Placement Confirmation: ETT inserted through vocal cords under direct vision, positive ETCO2, CO2 detector and breath sounds checked- equal and bilateral Secured at: 23 cm Tube secured with: Tape Dental Injury: Teeth and Oropharynx as per pre-operative assessment

## 2022-02-12 NOTE — Op Note (Signed)
Operative Note  Preoperative diagnosis:  1.  Complicated diverticulitis  Postoperative diagnosis: 1.  Same  Procedure(s): 1.  Cystoscopy with bilateral ureteral catheterization with bilateral retrograde instillation of firefly  Surgeon: Link Snuffer, MD  Assistants: None  Anesthesia: General  Complications: None immediate  EBL: Minimal  Specimens: 1.  None  Drains/Catheters: 1.  Foley catheter  Intraoperative findings: 1.  Normal anterior urethra 2.  Borderline obstructing prostate 3.  Bladder mucosa normal without any masses or stones. 4.  Successful instillation of 7.5 cc of firefly in each ureter  Indication: 66 year old male with complicated diverticulitis presents for partial colectomy.  Bilateral firefly was requested.  Description of procedure:  The patient was identified and consent was obtained.  The patient was taken to the operating room and placed in the supine position.  The patient was placed under general anesthesia.  Perioperative antibiotics were administered.  The patient was placed in dorsal lithotomy.  Patient was prepped and draped in a standard sterile fashion and a timeout was performed.  A 21 French rigid cystoscope was advanced into the urethra and into the bladder.  Complete cystoscopy was performed with no abnormal findings.  The left ureter was cannulated with an open-ended ureteral catheter and 7.5 cc of firefly was instilled retrograde into the left ureter.  The same was performed on the right.  I withdrew the scope and placed a Foley catheter.  This concluded my portion of the operation.  Patient tolerated procedure well.  Plan: As per general surgery.

## 2022-02-13 LAB — CBC
HCT: 41.8 % (ref 39.0–52.0)
Hemoglobin: 13.9 g/dL (ref 13.0–17.0)
MCH: 33.8 pg (ref 26.0–34.0)
MCHC: 33.3 g/dL (ref 30.0–36.0)
MCV: 101.7 fL — ABNORMAL HIGH (ref 80.0–100.0)
Platelets: 148 10*3/uL — ABNORMAL LOW (ref 150–400)
RBC: 4.11 MIL/uL — ABNORMAL LOW (ref 4.22–5.81)
RDW: 13.4 % (ref 11.5–15.5)
WBC: 10.9 10*3/uL — ABNORMAL HIGH (ref 4.0–10.5)
nRBC: 0 % (ref 0.0–0.2)

## 2022-02-13 LAB — BASIC METABOLIC PANEL
Anion gap: 8 (ref 5–15)
BUN: 7 mg/dL — ABNORMAL LOW (ref 8–23)
CO2: 25 mmol/L (ref 22–32)
Calcium: 9.1 mg/dL (ref 8.9–10.3)
Chloride: 107 mmol/L (ref 98–111)
Creatinine, Ser: 0.68 mg/dL (ref 0.61–1.24)
GFR, Estimated: >60 mL/min (ref >60)
Glucose, Bld: 199 mg/dL — ABNORMAL HIGH (ref 70–99)
Potassium: 4.7 mmol/L (ref 3.5–5.1)
Sodium: 140 mmol/L (ref 135–145)

## 2022-02-13 NOTE — Progress Notes (Signed)
  Transition of Care [facility]) Screening Note   Patient Details  Name: [name] Date of Birth: Dec 14, 1955   [facility] CM/SW Contact:    Lennart Pall, LCSW Phone Number: 02/13/2022, 10:46 AM    Transition of Care Department [facility]) has reviewed patient and no TOC needs have been identified at this time. We will continue to monitor patient advancement through interdisciplinary progression rounds. If new patient transition needs arise, please place a TOC consult.

## 2022-02-13 NOTE — Progress Notes (Signed)
1 Day Post-Op Robotic LAR for complicated diverticular disease Subjective: Having some lower abd pain, no flatus, no nausea this am  Objective: Vital signs in last 24 hours: Temp:  [97.3 F (36.3 C)-98 F (36.7 C)] 97.3 F (36.3 C) (07/13 0455) Pulse Rate:  [65-84] 69 (07/13 0455) Resp:  [13-18] 18 (07/13 0455) BP: (123-168)/(79-107) 127/87 (07/13 0455) SpO2:  [90 %-99 %] 95 % (07/13 0455) Weight:  [107.3 kg] 107.3 kg (07/12 1700)   Intake/Output from previous day: 07/12 0701 - 07/13 0700 In: 2295 [P.O.:720; I.V.:1475; IV Piggyback:100] Out: 4901 [Urine:4801; Blood:100] Intake/Output this shift: No intake/output data recorded.   General appearance: alert and cooperative GI: normal findings: soft, appropriately tender  Incision: no significant drainage  Lab Results:  Recent Labs    02/13/22 0438  WBC 10.9*  HGB 13.9  HCT 41.8  PLT 148*   BMET Recent Labs    02/13/22 0438  NA 140  K 4.7  CL 107  CO2 25  GLUCOSE 199*  BUN 7*  CREATININE 0.68  CALCIUM 9.1   PT/INR No results for input(s): "LABPROT", "INR" in the last 72 hours. ABG No results for input(s): "PHART", "HCO3" in the last 72 hours.  Invalid input(s): "PCO2", "PO2"  MEDS, Scheduled  acetaminophen  1,000 mg Oral Q6H   allopurinol  300 mg Oral Daily   alvimopan  12 mg Oral BID   enoxaparin (LOVENOX) injection  40 mg Subcutaneous Q24H   feeding supplement  237 mL Oral BID BM   fluticasone furoate-vilanterol  1 puff Inhalation Daily   And   umeclidinium bromide  1 puff Inhalation Daily   gabapentin  300 mg Oral BID   metoprolol succinate  25 mg Oral Daily   saccharomyces boulardii  250 mg Oral BID    Studies/Results: No results found.  Assessment: s/p Procedure(s): XI ROBOT ASSISTED LAPAROSCOPIC LOW ANTERIOR RESECTION AND APPENDECTOMY CYSTOSCOPY with FIREFLY INJECTION Patient Active Problem List   Diagnosis Date Noted   Diverticular disease 02/12/2022   Wears glasses 11/14/2021    Numbness of left foot 11/14/2021   History of pneumothorax 11/14/2021   History of peptic ulcer 11/14/2021   History of esophageal stricture 11/14/2021   Hiatal hernia 11/14/2021   Heterozygous factor V Leiden mutation ([place]) 11/14/2021   Gout 11/14/2021   Dysphasia 11/14/2021   DDD (degenerative disc disease), lumbar 11/14/2021   Chronic back pain 11/14/2021   Bilateral shoulder bursitis 11/14/2021   LLQ pain 10/22/2021   Left groin pain 10/22/2021   Diverticulitis of colon without hemorrhage 10/22/2021   Hepatic steatosis    Diverticulitis 09/23/2021   Chronic obstructive lung disease ([place]) 08/16/2021   Obstructive sleep apnea of adult 08/16/2021   Malignant tumor of prostate ([place]) 08/16/2021   Pulmonary embolism ([place]) 08/16/2021   Acute respiratory failure with hypoxia ([place]) 08/16/2021   Hyperglycemia 08/16/2021   Hypokalemia 08/16/2021   History of DVT (deep vein thrombosis) 08/16/2021   Mixed hyperlipidemia 08/16/2021   Coronary artery disease involving native coronary artery of native heart with angina pectoris ([place])    Neuropathy 06/13/2021   Vitamin D deficiency 06/13/2021   On long term drug therapy 06/13/2021   Bilateral carpal tunnel syndrome 10/31/2020   Carpal tunnel syndrome of right wrist 10/31/2020   Pain in right hand 10/31/2020   Skin sensation disturbance 10/31/2020   Idiopathic chronic gout, unspecified site, without tophus (tophi) 10/03/2020   Bilateral shoulder pain 10/03/2020   Diverticulitis of colon 08/10/2020   Alcohol abuse 10/12/2019  Acute alcoholic pancreatitis 96/22/2979   History of hip replacement 02/07/2019   Avascular necrosis of femur ([place]) 01/05/2019   Lumbar radiculopathy 01/05/2019   History of colonic polyps 08/19/2018   Esophageal stenosis 08/19/2018   Hip problem 2020   DVT (deep venous thrombosis) ([place]) 05/11/2018   Solid nodule of lung greater than 8 mm in diameter 05/11/2018   Abnormal CT scan, colon 04/13/2018   Obstructive  sleep apnea treated with continuous positive airway pressure (CPAP) 09/29/2017   GERD (gastroesophageal reflux disease) 04/07/2017   Dysphagia 01/29/2017   Malignant neoplasm of prostate ([place]) 01/19/2017   Pancreatitis, alcoholic 89/21/1941   Alcoholism ([place]) 11/11/2011   Tobacco abuse 11/11/2011   Thrombocytopenia ([place]) 11/11/2011   HYPERSOMNIA, ASSOCIATED WITH SLEEP APNEA 09/27/2009   COPD (chronic obstructive pulmonary disease) ([place]) 09/07/2009   Pneumothorax 09/07/2009   BURSITIS 09/07/2009   FATIGUE / MALAISE 09/07/2009   SHORTNESS OF BREATH 09/07/2009   PNEUMOTHORAX 09/07/2009   History of pneumococcal pneumonia 2006    Expected post op course  Plan: Cont fulls Ambulate in hall SL IVF's   LOS: 1 day     .Rosario Adie, MD [facility], [place]    02/13/2022 7:37 AM

## 2022-02-14 ENCOUNTER — Other Ambulatory Visit (HOSPITAL_COMMUNITY): Payer: Self-pay

## 2022-02-14 LAB — BASIC METABOLIC PANEL
Anion gap: 6 (ref 5–15)
BUN: 12 mg/dL (ref 8–23)
CO2: 29 mmol/L (ref 22–32)
Calcium: 9.4 mg/dL (ref 8.9–10.3)
Chloride: 109 mmol/L (ref 98–111)
Creatinine, Ser: 0.71 mg/dL (ref 0.61–1.24)
GFR, Estimated: >60 mL/min (ref >60)
Glucose, Bld: 119 mg/dL — ABNORMAL HIGH (ref 70–99)
Potassium: 4.2 mmol/L (ref 3.5–5.1)
Sodium: 144 mmol/L (ref 135–145)

## 2022-02-14 LAB — CBC
HCT: 42.3 % (ref 39.0–52.0)
Hemoglobin: 14 g/dL (ref 13.0–17.0)
MCH: 34 pg (ref 26.0–34.0)
MCHC: 33.1 g/dL (ref 30.0–36.0)
MCV: 102.7 fL — ABNORMAL HIGH (ref 80.0–100.0)
Platelets: 160 10*3/uL (ref 150–400)
RBC: 4.12 MIL/uL — ABNORMAL LOW (ref 4.22–5.81)
RDW: 13.6 % (ref 11.5–15.5)
WBC: 11.7 10*3/uL — ABNORMAL HIGH (ref 4.0–10.5)
nRBC: 0 % (ref 0.0–0.2)

## 2022-02-14 LAB — SURGICAL PATHOLOGY

## 2022-02-14 MED ORDER — TRAMADOL HCL 50 MG PO TABS
50.0000 mg | ORAL_TABLET | Freq: Four times a day (QID) | ORAL | 0 refills | Status: DC | PRN
  Filled 2022-02-14: qty 30, 4d supply, fill #0

## 2022-02-14 NOTE — Progress Notes (Signed)
Patient was given discharge instructions, and all questions were answered.  Patient was stable for discharge and was taken to the main exit by wheelchair. 

## 2022-02-14 NOTE — Discharge Instructions (Signed)
SURGERY: POST OP INSTRUCTIONS (Surgery for small bowel obstruction, colon resection, etc)   ######################################################################  EAT Gradually transition to a high fiber diet with a fiber supplement over the next few days after discharge  WALK Walk an hour a day.  Control your pain to do that.    CONTROL PAIN Control pain so that you can walk, sleep, tolerate sneezing/coughing, go up/down stairs.  HAVE A BOWEL MOVEMENT DAILY Keep your bowels regular to avoid problems.  OK to try a laxative to override constipation.  OK to use an antidairrheal to slow down diarrhea.  Call if not better after 2 tries  CALL IF YOU HAVE PROBLEMS/CONCERNS Call if you are still struggling despite following these instructions. Call if you have concerns not answered by these instructions  ######################################################################   DIET Follow a light diet the first few days at home.  Start with a bland diet such as soups, liquids, starchy foods, low fat foods, etc.  If you feel full, bloated, or constipated, stay on a ful liquid or pureed/blenderized diet for a few days until you feel better and no longer constipated. Be sure to drink plenty of fluids every day to avoid getting dehydrated (feeling dizzy, not urinating, etc.). Gradually add a fiber supplement to your diet over the next week.  Gradually get back to a regular solid diet.  Avoid fast food or heavy meals the first week as you are more likely to get nauseated. It is expected for your digestive tract to need a few months to get back to normal.  It is common for your bowel movements and stools to be irregular.  You will have occasional bloating and cramping that should eventually fade away.  Until you are eating solid food normally, off all pain medications, and back to regular activities; your bowels will not be normal. Focus on eating a low-fat, high fiber diet the rest of your life  (See Getting to Good Bowel Health, below).  CARE of your INCISION or WOUND  It is good for closed incisions and even open wounds to be washed every day.  Shower every day.  Short baths are fine.  Wash the incisions and wounds clean with soap & water.    You may leave closed incisions open to air if it is dry.   You may cover the incision with clean gauze & replace it after your daily shower for comfort.  STAPLES: You have skin staples.  Leave them in place & set up an appointment for them to be removed by a surgery office nurse ~10 days after surgery. = 1st week of January 2024    ACTIVITIES as tolerated Start light daily activities --- self-care, walking, climbing stairs-- beginning the day after surgery.  Gradually increase activities as tolerated.  Control your pain to be active.  Stop when you are tired.  Ideally, walk several times a day, eventually an hour a day.   Most people are back to most day-to-day activities in a few weeks.  It takes 4-8 weeks to get back to unrestricted, intense activity. If you can walk 30 minutes without difficulty, it is safe to try more intense activity such as jogging, treadmill, bicycling, low-impact aerobics, swimming, etc. Save the most intensive and strenuous activity for last (Usually 4-8 weeks after surgery) such as sit-ups, heavy lifting, contact sports, etc.  Refrain from any intense heavy lifting or straining until you are off narcotics for pain control.  You will have off days, but things should improve   week-by-week. DO NOT PUSH THROUGH PAIN.  Let pain be your guide: If it hurts to do something, don't do it.  Pain is your body warning you to avoid that activity for another week until the pain goes down. You may drive when you are no longer taking narcotic prescription pain medication, you can comfortably wear a seatbelt, and you can safely make sudden turns/stops to protect yourself without hesitating due to pain. You may have sexual intercourse when it  is comfortable. If it hurts to do something, stop.  MEDICATIONS Take your usually prescribed home medications unless otherwise directed.   Blood thinners:  Usually you can restart any strong blood thinners after the second postoperative day.  It is OK to take aspirin right away.     If you are on strong blood thinners (warfarin/Coumadin, Plavix, Xerelto, Eliquis, Pradaxa, etc), discuss with your surgeon, medicine PCP, and/or cardiologist for instructions on when to restart the blood thinner & if blood monitoring is needed (PT/INR blood check, etc).     PAIN CONTROL Pain after surgery or related to activity is often due to strain/injury to muscle, tendon, nerves and/or incisions.  This pain is usually short-term and will improve in a few months.  To help speed the process of healing and to get back to regular activity more quickly, DO THE FOLLOWING THINGS TOGETHER: Increase activity gradually.  DO NOT PUSH THROUGH PAIN Use Ice and/or Heat Try Gentle Massage and/or Stretching Take over the counter pain medication Take Narcotic prescription pain medication for more severe pain  Good pain control = faster recovery.  It is better to take more medicine to be more active than to stay in bed all day to avoid medications.  Increase activity gradually Avoid heavy lifting at first, then increase to lifting as tolerated over the next 6 weeks. Do not "push through" the pain.  Listen to your body and avoid positions and maneuvers than reproduce the pain.  Wait a few days before trying something more intense Walking an hour a day is encouraged to help your body recover faster and more safely.  Start slowly and stop when getting sore.  If you can walk 30 minutes without stopping or pain, you can try more intense activity (running, jogging, aerobics, cycling, swimming, treadmill, sex, sports, weightlifting, etc.) Remember: If it hurts to do it, then don't do it! Use Ice and/or Heat You will have swelling and  bruising around the incisions.  This will take several weeks to resolve. Ice packs or heating pads (6-8 times a day, 30-60 minutes at a time) will help sooth soreness & bruising. Some people prefer to use ice alone, heat alone, or alternate between ice & heat.  Experiment and see what works best for you.  Consider trying ice for the first few days to help decrease swelling and bruising; then, switch to heat to help relax sore spots and speed recovery. Shower every day.  Short baths are fine.  It feels good!  Keep the incisions and wounds clean with soap & water.   Try Gentle Massage and/or Stretching Massage at the area of pain many times a day Stop if you feel pain - do not overdo it Take over the counter pain medication This helps the muscle and nerve tissues become less irritable and calm down faster Choose ONE of the following over-the-counter anti-inflammatory medications: Acetaminophen 500mg tabs (Tylenol) 1-2 pills with every meal and just before bedtime (avoid if you have liver problems or if you have   acetaminophen in you narcotic prescription) Naproxen 220mg tabs (ex. Aleve, Naprosyn) 1-2 pills twice a day (avoid if you have kidney, stomach, IBD, or bleeding problems) Ibuprofen 200mg tabs (ex. Advil, Motrin) 3-4 pills with every meal and just before bedtime (avoid if you have kidney, stomach, IBD, or bleeding problems) Take with food/snack several times a day as directed for at least 2 weeks to help keep pain / soreness down & more manageable. Take Narcotic prescription pain medication for more severe pain A prescription for strong pain control is often given to you upon discharge (for example: oxycodone/Percocet, hydrocodone/Norco/Vicodin, or tramadol/Ultram) Take your pain medication as prescribed. Be mindful that most narcotic prescriptions contain Tylenol (acetaminophen) as well - avoid taking too much Tylenol. If you are having problems/concerns with the prescription medicine (does  not control pain, nausea, vomiting, rash, itching, etc.), please call us [phone] to see if we need to switch you to a different pain medicine that will work better for you and/or control your side effects better. If you need a refill on your pain medication, you must call the office before 4 pm and on weekdays only.  By federal law, prescriptions for narcotics cannot be called into a pharmacy.  They must be filled out on paper & picked up from our office by the patient or authorized caretaker.  Prescriptions cannot be filled after 4 pm nor on weekends.    WHEN TO CALL US [phone] Severe uncontrolled or worsening pain  Fever over 101 F (38.5 C) Concerns with the incision: Worsening pain, redness, rash/hives, swelling, bleeding, or drainage Reactions / problems with new medications (itching, rash, hives, nausea, etc.) Nausea and/or vomiting Difficulty urinating Difficulty breathing Worsening fatigue, dizziness, lightheadedness, blurred vision Other concerns If you are not getting better after two weeks or are noticing you are getting worse, contact our office [phone] for further advice.  We may need to adjust your medications, re-evaluate you in the office, send you to the emergency room, or see what other things we can do to help. The clinic staff is available to answer your questions during regular business hours (8:30am-5pm).  Please don't hesitate to call and ask to speak to one of our nurses for clinical concerns.    A surgeon from [facility] is always on call at the hospitals 24 hours/day If you have a medical emergency, go to the nearest emergency room or call 911.  FOLLOW UP in our office One the day of your discharge from the hospital (or the next business weekday), please call [facility] to set up or confirm an appointment to see your surgeon in the office for a follow-up appointment.  Usually it is 2-3 weeks after your surgery.   If you  have skin staples at your incision(s), let the office know so we can set up a time in the office for the nurse to remove them (usually around 10 days after surgery). Make sure that you call for appointments the day of discharge (or the next business weekday) from the hospital to ensure a convenient appointment time. IF YOU HAVE DISABILITY OR FAMILY LEAVE FORMS, BRING THEM TO THE OFFICE FOR PROCESSING.  DO NOT GIVE THEM TO YOUR DOCTOR.  [facility] [address], [address], [place], [place]  [address] ? [phone] - Main [phone] - Toll Free,  [phone] - Fax [email or web address]    GETTING TO GOOD BOWEL HEALTH. It is expected for your digestive tract to   need a few months to get back to normal.  It is common for your bowel movements and stools to be irregular.  You will have occasional bloating and cramping that should eventually fade away.  Until you are eating solid food normally, off all pain medications, and back to regular activities; your bowels will not be normal.   Avoiding constipation The goal: ONE SOFT BOWEL MOVEMENT A DAY!    Drink plenty of fluids.  Choose water first. TAKE A FIBER SUPPLEMENT EVERY DAY THE REST OF YOUR LIFE During your first week back home, gradually add back a fiber supplement every day Experiment which form you can tolerate.   There are many forms such as powders, tablets, wafers, gummies, etc Psyllium bran (Metamucil), methylcellulose (Citrucel), Miralax or Glycolax, Benefiber, Flax Seed.  Adjust the dose week-by-week (1/2 dose/day to 6 doses a day) until you are moving your bowels 1-2 times a day.  Cut back the dose or try a different fiber product if it is giving you problems such as diarrhea or bloating. Sometimes a laxative is needed to help jump-start bowels if constipated until the fiber supplement can help regulate your bowels.  If you are tolerating eating & you are farting, it is okay to try a gentle  laxative such as double dose MiraLax, prune juice, or Milk of Magnesia.  Avoid using laxatives too often. Stool softeners can sometimes help counteract the constipating effects of narcotic pain medicines.  It can also cause diarrhea, so avoid using for too long. If you are still constipated despite taking fiber daily, eating solids, and a few doses of laxatives, call our office. Controlling diarrhea Try drinking liquids and eating bland foods for a few days to avoid stressing your intestines further. Avoid dairy products (especially milk & ice cream) for a short time.  The intestines often can lose the ability to digest lactose when stressed. Avoid foods that cause gassiness or bloating.  Typical foods include beans and other legumes, cabbage, broccoli, and dairy foods.  Avoid greasy, spicy, fast foods.  Every person has some sensitivity to other foods, so listen to your body and avoid those foods that trigger problems for you. Probiotics (such as active yogurt, Align, etc) may help repopulate the intestines and colon with normal bacteria and calm down a sensitive digestive tract Adding a fiber supplement gradually can help thicken stools by absorbing excess fluid and retrain the intestines to act more normally.  Slowly increase the dose over a few weeks.  Too much fiber too soon can backfire and cause cramping & bloating. It is okay to try and slow down diarrhea with a few doses of antidiarrheal medicines.   Bismuth subsalicylate (ex. Kayopectate, Pepto Bismol) for a few doses can help control diarrhea.  Avoid if pregnant.   Loperamide (Imodium) can slow down diarrhea.  Start with one tablet (2mg) first.  Avoid if you are having fevers or severe pain.  ILEOSTOMY PATIENTS WILL HAVE CHRONIC DIARRHEA since their colon is not in use.    Drink plenty of liquids.  You will need to drink even more glasses of water/liquid a day to avoid getting dehydrated. Record output from your ileostomy.  Expect to empty  the bag every 3-4 hours at first.  Most people with a permanent ileostomy empty their bag 4-6 times at the least.   Use antidiarrheal medicine (especially Imodium) several times a day to avoid getting dehydrated.  Start with a dose at bedtime & breakfast.  Adjust up or   down as needed.  Increase antidiarrheal medications as directed to avoid emptying the bag more than 8 times a day (every 3 hours). Work with your wound ostomy nurse to learn care for your ostomy.  See ostomy care instructions. TROUBLESHOOTING IRREGULAR BOWELS 1) Start with a soft & bland diet. No spicy, greasy, or fried foods.  2) Avoid gluten/wheat or dairy products from diet to see if symptoms improve. 3) Miralax 17gm or flax seed mixed in 8oz. water or juice-daily. May use 2-4 times a day as needed. 4) Gas-X, Phazyme, etc. as needed for gas & bloating.  5) Prilosec (omeprazole) over-the-counter as needed 6)  Consider probiotics (Align, Activa, etc) to help calm the bowels down  Call your doctor if you are getting worse or not getting better.  Sometimes further testing (cultures, endoscopy, X-ray studies, CT scans, bloodwork, etc.) may be needed to help diagnose and treat the cause of the diarrhea. [facility] [address], [address], , [place]  [address] [phone] - Main.    [phone]  - Toll Free.   [phone] - Fax [email or web address]   ###############################   #######################################################  Ostomy Support Information  You've heard that people get along just fine with only one of their eyes, or one of their lungs, or one of their kidneys. But you also know that you have only one intestine and only one bladder, and that leaves you feeling awfully empty, both physically and emotionally: You think no other people go around without part of their intestine with the ends of their intestines sticking out through their abdominal walls.    YOU ARE NOT ALONE.  There are nearly three quarters of a million people in the [place] who have an ostomy; people who have had surgery to remove all or part of their colons or bladders.   There is even a national association, the [employer] with over 350 local affiliated support groups that are organized by volunteers who provide peer support and counseling. [employer] has a toll free telephone num-ber, 800-826-0826 and an educational, interactive website, www.ostomy.org   An ostomy is an opening in the belly (abdominal wall) made by surgery. Ostomates are people who have had this procedure. The opening (stoma) allows the kidney or bowel to grdischarge waste. An external pouch covers the stoma to collect waste. Pouches are are a simple bag and are odor free. Different companies have disposable or reusable pouches to fit one's lifestyle. An ostomy can either be temporary or permanent.   THERE ARE THREE MAIN TYPES OF OSTOMIES Colostomy. A colostomy is a surgically created opening in the large intestine (colon). Ileostomy. An ileostomy is a surgically created opening in the small intestine. Urostomy. A urostomy is a surgically created opening to divert urine away from the bladder.  OSTOMY Care  The following guidelines will make care of your colostomy easier. Keep this information close by for quick reference.  Helpful DIET hints Eat a well-balanced diet including vegetables and fresh fruits. Eat on a regular schedule.  Drink at least 6 to 8 glasses of fluids daily. Eat slowly in a relaxed atmosphere. Chew your food thoroughly. Avoid chewing gum, smoking, and drinking from a straw. This will help decrease the amount of air you swallow, which may help reduce gas. Eating yogurt or drinking buttermilk may help reduce gas.  To control gas at night, do not eat after 8 p.m. This will give your bowel time to quiet down before you go   to bed.  If gas is a problem, you can purchase  Beano. Sprinkle Beano on the first bite of food before eating to reduce gas. It has no flavor and should not change the taste of your food. You can buy Beano over the counter at your local drugstore.  Foods like fish, onions, garlic, broccoli, asparagus, and cabbage produce odor. Although your pouch is odor-proof, if you eat these foods you may notice a stronger odor when emptying your pouch. If this is a concern, you may want to limit these foods in your diet.  If you have an ileostomy, you will have chronic diarrhea & need to drink more liquids to avoid getting dehydrated.  Consider antidiarrheal medicine like imodium (loperamide) or Lomotil to help slow down bowel movements / diarrhea into your ileostomy bag.  GETTING TO GOOD BOWEL HEALTH WITH AN ILEOSTOMY    With the colon bypassed & not in use, you will have small bowel diarrhea.   It is important to thicken & slow your bowel movements down.   The goal: 4-6 small BOWEL MOVEMENTS A DAY It is important to drink plenty of liquids to avoid getting dehydrated  CONTROLLING ILEOSTOMY DIARRHEA  TAKE A FIBER SUPPLEMENT (FiberCon or Benefiner soluble fiber) twice a day - to thicken stools by absorbing excess fluid and retrain the intestines to act more normally.  Slowly increase the dose over a few weeks.  Too much fiber too soon can backfire and cause cramping & bloating.  TAKE AN IRON SUPPLEMENT twice a day to naturally constipate your bowels.  Usually ferrous sulfate 325mg twice a day)  TAKE ANTI-DIARRHEAL MEDICINES: Loperamide (Imodium) can slow down diarrhea.  Start with two tablets (= 4mg) first and then try one tablet every 6 hours.  Can go up to 2 pills four times day (8 pills of 2mg max) Avoid if you are having fevers or severe pain.  If you are not better or start feeling worse, stop all medicines and call your doctor for advice LoMotil (Diphenoxylate / Atropine) is another medicine that can constipate & slow down bowel moevements Pepto  Bismol (bismuth) can gently thicken bowels as well  If diarrhea is worse,: drink plenty of liquids and try simpler foods for a few days to avoid stressing your intestines further. Avoid dairy products (especially milk & ice cream) for a short time.  The intestines often can lose the ability to digest lactose when stressed. Avoid foods that cause gassiness or bloating.  Typical foods include beans and other legumes, cabbage, broccoli, and dairy foods.  Every person has some sensitivity to other foods, so listen to our body and avoid those foods that trigger problems for you.Call your doctor if you are getting worse or not better.  Sometimes further testing (cultures, endoscopy, X-ray studies, bloodwork, etc) may be needed to help diagnose and treat the cause of the diarrhea. Take extra anti-diarrheal medicines (maximum is 8 pills of 2mg loperamide a day)   Tips for POUCHING an OSTOMY   Changing Your Pouch The best time to change your pouch is in the morning, before eating or drinking anything. Your stoma can function at any time, but it will function more after eating or drinking.   Applying the pouching system  Place all your equipment close at hand before removing your pouch.  Wash your hands.  Stand or sit in front of a mirror. Use the position that works best for you. Remember that you must keep the skin around the stoma   wrinkle-free for a good seal.  Gently remove the used pouch (1-piece system) or the pouch and old wafer (2-piece system). Empty the pouch into the toilet. Save the closure clip to use again.  Wash the stoma itself and the skin around the stoma. Your stoma may bleed a little when being washed. This is normal. Rinse and pat dry. You may use a wash cloth or soft paper towels (like Bounty), mild soap (like Dial, Safeguard, or Ivory), and water. Avoid soaps that contain perfumes or lotions.  For a new pouch (1-piece system) or a new wafer (2-piece system), measure your  stoma using the stoma guide in each box of supplies.  Trace the shape of your stoma onto the back of the new pouch or the back of the new wafer. Cut out the opening. Remove the paper backing and set it aside.  Optional: Apply a skin barrier powder to surrounding skin if it is irritated (bare or weeping), and dust off the excess. Optional: Apply a skin-prep wipe (such as Skin Prep or All-Kare) to the skin around the stoma, and let it dry. Do not apply this solution if the skin is irritated (red, tender, or broken) or if you have shaved around the stoma. Optional: Apply a skin barrier paste (such as Stomahesive, Coloplast, or Premium) around the opening cut in the back of the pouch or wafer. Allow it to dry for 30 to 60 seconds.  Hold the pouch (1-piece system) or wafer (2-piece system) with the sticky side toward your body. Make sure the skin around the stoma is wrinkle-free. Center the opening on the stoma, then press firmly to your abdomen (Fig. 4). Look in the mirror to check if you are placing the pouch, or wafer, in the right position. For a 2-piece system, snap the pouch onto the wafer. Make sure it snaps into place securely.  Place your hand over the stoma and the pouch or wafer for about 30 seconds. The heat from your hand can help the pouch or wafer stick to your skin.  Add deodorant (such as Super Banish or Nullo) to your pouch. Other options include food extracts such as vanilla oil and peppermint extract. Add about 10 drops of the deodorant to the pouch. Then apply the closure clamp. Note: Do not use toxic  chemicals or commercial cleaning agents in your pouch. These substances may harm the stoma.  Optional: For extra seal, apply tape to all 4 sides around the pouch or wafer, as if you were framing a picture. You may use any brand of medical adhesive tape. Change your pouch every 5 to 7 days. Change it immediately if a leak occurs.  Wash your hands afterwards.  If you are wearing a  2-piece system, you may use 2 new pouches per week and alternate them. Rinse the pouch with mild soap and warm water and hang it to dry for the next day. Apply the fresh pouch. Alternate the 2 pouches like this for a week. After a week, change the wafer and begin with 2 new pouches. Place the old pouches in a plastic bag, and put them in the trash.   LIVING WITH AN OSTOMY  Emptying Your Pouch Empty your pouch when it is one-third full (of urine, stool, and/or gas). If you wait until your pouch is fuller than this, it will be more difficult to empty and more noticeable. When you empty your pouch, either put toilet paper in the toilet bowl first, or flush the   toilet while you empty the pouch. This will reduce splashing. You can empty the pouch between your legs or to one side while sitting, or while standing or stooping. If you have a 2-piece system, you can snap off the pouch to empty it. Remember that your stoma may function during this time. If you wish to rinse your pouch after you empty it, a turkey baster can be helpful. When using a baster, squirt water up into the pouch through the opening at the bottom. With a 2-piece system, you can snap off the pouch to rinse it. After rinsing  your pouch, empty it into the toilet. When rinsing your pouch at home, put a few granules of Dreft soap in the rinse water. This helps lubricate and freshen your pouch. The inside of your pouch can be sprayed with non-stick cooking oil (Pam spray). This may help reduce stool sticking to the inside of the pouch.  Bathing You may shower or bathe with your pouch on or off. Remember that your stoma may function during this time.  The materials you use to wash your stoma and the skin around it should be clean, but they do not need to be sterile.  Wearing Your Pouch During hot weather, or if you perspire a lot in general, wear a cover over your pouch. This may prevent a rash on your skin under the pouch. Pouch covers are  sold at ostomy supply stores. Wear the pouch inside your underwear for better support. Watch your weight. Any gain or loss of 10 to 15 pounds or more can change the way your pouch fits.  Going Away From Home A collapsible cup (like those that come in travel kits) or a soft plastic squirt bottle with a pull-up top (like a travel bottle for shampoo) can be used for rinsing your pouch when you are away from home. Tilt the opening of the pouch at an upward angle when using a cup to rinse.  Carry wet wipes or extra tissues to use in public bathrooms.  Carry an extra pouching system with you at all times.  Never keep ostomy supplies in the glove compartment of your car. Extreme heat or cold can damage the skin barriers and adhesive wafers on the pouch.  When you travel, carry your ostomy supplies with you at all times. Keep them within easy reach. Do not pack ostomy supplies in baggage that will be checked or otherwise separated from you, because your baggage might be lost. If you're traveling out of the country, it is helpful to have a letter stating that you are carrying ostomy supplies as a medical necessity.  If you need ostomy supplies while traveling, look in the yellow pages of the telephone book under "Surgical Supplies." Or call the local ostomy organization to find out where supplies are available.  Do not let your ostomy supplies get low. Always order new pouches before you use the last one.  Reducing Odor Limit foods such as broccoli, cabbage, onions, fish, and garlic in your diet to help reduce odor. Each time you empty your pouch, carefully clean the opening of the pouch, both inside and outside, with toilet paper. Rinse your pouch 1 or 2 times daily after you empty it (see directions for emptying your pouch and going away from home). Add deodorant (such as Super Banish or Nullo) to your pouch. Use air deodorizers in your bathroom. Do not add aspirin to your pouch. Even though  aspirin can help prevent odor, it   could cause ulcers on your stoma.  When to call the doctor Call the doctor if you have any of the following symptoms: Purple, black, or white stoma Severe cramps lasting more than 6 hours Severe watery discharge from the stoma lasting more than 6 hours No output from the colostomy for 3 days Excessive bleeding from your stoma Swelling of your stoma to more than 1/2-inch larger than usual Pulling inward of your stoma below skin level Severe skin irritation or deep ulcers Bulging or other changes in your abdomen  When to call your ostomy nurse Call your ostomy/enterostomal therapy (WOCN) nurse if any of the following occurs: Frequent leaking of your pouching system Change in size or appearance of your stoma, causing discomfort or problems with your pouch Skin rash or rawness Weight gain or loss that causes problems with your pouch     FREQUENTLY ASKED QUESTIONS   Why haven't you met any of these folks who have an ostomy?  Well, maybe you have! You just did not recognize them because an ostomy doesn't show. It can be kept secret if you wish. Why, maybe some of your best friends, office associates or neighbors have an ostomy ... you never can tell. People facing ostomy surgery have many quality-of-life questions like: Will you bulge? Smell? Make noises? Will you feel waste leaving your body? Will you be a captive of the toilet? Will you starve? Be a social outcast? Get/stay married? Have babies? Easily bathe, go swimming, bend over?  OK, let's look at what you can expect:   Will you bulge?  Remember, without part of the intestine or bladder, and its contents, you should have a flatter tummy than before. You can expect to wear, with little exception, what you wore before surgery ... and this in-cludes tight clothing and bathing suits.   Will you smell?  Today, thanks to modern odor proof pouching systems, you can walk into an ostomy support group  meeting and not smell anything that is foul or offensive. And, for those with an ileostomy or colostomy who are concerned about odor when emptying their pouch, there are in-pouch deodorants that can be used to eliminate any waste odors that may exist.   Will you make noises?  Everyone produces gas, especially if they are an air-swallower. But intestinal sounds that occur from time to time are no differ-ent than a gurgling tummy, and quite often your clothing will muffle any sounds.   Will you feel the waste discharges?  For those with a colostomy or ileostomy there might be a slight pressure when waste leaves your body, but understand that the intestines have no nerve endings, so there will be no unpleasant sensations. Those with a urostomy will probably be unaware of any kidney drainage.   Will you be a captive of the toilet?  Immediately post-op you will spend more time in the bathroom than you will after your body recovers from surgery. Every person is different, but on average those with an ileostomy or urostomy may empty their pouches 4 to 6 times a day; a little  less if you have a colostomy. The average wear time between pouch system changes is 3 to 5 days and the changing process should take less than 30 minutes.   Will I need to be on a special diet? Most people return to their normal diet when they have recovered from surgery. Be sure to chew your food well, eat a well-balanced diet and drink plenty of fluids. If   you experience problems with a certain food, wait a couple of weeks and try it again.  Will there be odor and noises? Pouching systems are designed to be odor-proof or odor-resistant. There are deodorants that can be used in the pouch. Medications are also available to help reduce odor. Limit gas-producing foods and carbonated beverages. You will experience less gas and fewer noises as you heal from surgery.  How much time will it take to care for my ostomy? At first, you may  spend a lot of time learning about your ostomy and how to take care of it. As you become more comfortable and skilled at changing the pouching system, it will take very little time to care for it.   Will I be able to return to work? People with ostomies can perform most jobs. As soon as you have healed from surgery, you should be able to return to work. Heavy lifting (more than 10 pounds) may be discouraged.   What about intimacy? Sexual relationships and intimacy are important and fulfilling aspects of your life. They should continue after ostomy surgery. Intimacy-related concerns should be discussed openly between you and your partner.   Can I wear regular clothing? You do not need to wear special clothing. Ostomy pouches are fairly flat and barely noticeable. Elastic undergarments will not hurt the stoma or prevent the ostomy from functioning.   Can I participate in sports? An ostomy should not limit your involvement in sports. Many people with ostomies are runners, skiers, swimmers or participate in other active lifestyles. Talk with your caregiver first before doing heavy physical activity.  Will you starve?  Not if you follow doctor's orders at each stage of your post-op adjustment. There is no such thing as an "ostomy diet". Some people with an ostomy will be able to eat and tolerate anything; others may find diffi-culty with some foods. Each person is an individual and must determine, by trial, what is best for them. A good practice for all is to drink plenty of water.   Will you be a social outcast?  Have you met anyone who has an ostomy and is a social outcast? Why should you be the first? Only your attitude and self image will effect how you are treated. No confi-dent person is an outcast.    PROFESSIONAL HELP   Resources are available if you need help or have questions about your ostomy.   Specially trained nurses called Wound, Ostomy Continence Nurses (WOCN) are available for  consultation in most major medical centers.  Consider getting an ostomy consult at an outpatient ostomy clinic.   [place] has an Ostomy Clinic run by an WOCN ostomy nurse at the [facility].  [phone]. [facility] can help set up an appointment   The [employer] (UOA) is a group made up of many local chapters throughout the [place]. These local groups hold meetings and provide support to prospective and existing ostomates. They sponsor educational events and have qualified visitors to make personal or telephone visits. Contact the UOA for the chapter nearest you and for other educational publications.  More detailed information can be found in Colostomy Guide, a publication of the [employer] (UOA). Contact UOA at 1-800-826-0826 or visit their web site at www.uoaa.org. The website contains links to other sites, suppliers and resources.  [facility] Secure Start Services: Start at the website to enlist for support.  Your Wound Ostomy (WOCN) nurse may have started this   process. https://www.[employer].com/en/securestart Secure Start services are designed to support people as they live their lives with an ostomy or neurogenic bladder. Enrolling is easy and at no cost to the patient. We realize that each person's needs and life journey are different. Through Secure Start services, we want to help people live their life, their way.  #######################################################  

## 2022-02-14 NOTE — Discharge Summary (Signed)
Physician Discharge Summary  Patient ID: [name] MRN: [id number] DOB/AGE: Jun 04, 1956 66 y.o.  Admit date: 02/12/2022 Discharge date: 02/14/2022  Admission Diagnoses: Diverticular disease Discharge Diagnoses:  Principal Problem:   Diverticular disease   Discharged Condition: good  Hospital Course: Patient was admitted to the med surg floor after surgery.  Diet was advanced as tolerated.  Patient began to have bowel function on postop day 1.  By postop day 2, he was tolerating a solid diet and pain was controlled with oral medications.  He was urinating without difficulty and ambulating without assistance.  Patient was felt to be in stable condition for discharge to home.   Consults: None  Significant Diagnostic Studies: labs: cbc, bmet  Treatments: IV hydration, analgesia: acetaminophen, and surgery: robotic LAR  Discharge Exam: Blood pressure 120/73, pulse (!) 50, temperature 97.6 F (36.4 C), temperature source Oral, resp. rate 18, height '6\' 2"'$  (1.88 m), weight 106.5 kg, SpO2 97 %. General appearance: alert and cooperative GI: normal findings: soft, non-tender Incision/Wound: clean, dry, intact  Disposition: home   Allergies as of 02/14/2022   No Known Allergies      Medication List     STOP taking these medications    metroNIDAZOLE 500 MG tablet Commonly known as: FLAGYL       TAKE these medications    acetaminophen 500 MG tablet Commonly known as: TYLENOL Take 2 tablets (1,000 mg total) by mouth every 6 (six) hours as needed for mild pain.   albuterol 108 (90 Base) MCG/ACT inhaler Commonly known as: VENTOLIN HFA Inhale 2 puffs into the lungs every 6 (six) hours as needed for wheezing or shortness of breath.   albuterol (2.5 MG/3ML) 0.083% nebulizer solution Commonly known as: PROVENTIL Take 2.5 mg by nebulization every 6 (six) hours as needed for wheezing or shortness of breath.   allopurinol 300 MG tablet Commonly known as: ZYLOPRIM Take  300 mg by mouth daily.   apixaban 5 MG Tabs tablet Commonly known as: ELIQUIS Take 1 tablet (5 mg total) by mouth 2 (two) times daily.   doxycycline 50 MG capsule Commonly known as: VIBRAMYCIN Take 50 mg by mouth daily.   metoprolol succinate 25 MG 24 hr tablet Commonly known as: Toprol XL Take 1 tablet (25 mg total) by mouth daily.   multivitamin with minerals Tabs tablet Take 1 tablet by mouth daily.   NON FORMULARY Pt uses a cpap nightly   polyethylene glycol powder 17 GM/SCOOP powder Commonly known as: GLYCOLAX/MIRALAX Take 8.5 g by mouth daily.   rosuvastatin 20 MG tablet Commonly known as: CRESTOR Take 1 tablet (20 mg total) by mouth daily.   SOOTHE OP Place 1 drop into both eyes 2 (two) times daily as needed (dry eyes).   traMADol 50 MG tablet Commonly known as: ULTRAM Take 1-2 tablets (50-100 mg total) by mouth every 6 (six) hours as needed for moderate pain.   Trelegy Ellipta 100-62.5-25 MCG/ACT Aepb Generic drug: Fluticasone-Umeclidin-Vilant Inhale 1 puff into the lungs daily.        Follow-up Information     Leighton Ruff, MD Follow up in 2 week(s).   Specialties: General Surgery, Colon and Rectal Surgery Contact information: [address] [address] [phone]                 Signed: Rosario Adie 02/11/6268, 8:12 AM

## 2022-03-24 ENCOUNTER — Ambulatory Visit (HOSPITAL_COMMUNITY)
Admission: RE | Admit: 2022-03-24 | Discharge: 2022-03-24 | Disposition: A | Discharge status: Home / Self Care | Payer: PPO | Source: Ambulatory Visit | Attending: Family Medicine | Admitting: Family Medicine

## 2022-03-24 ENCOUNTER — Other Ambulatory Visit: Payer: Self-pay | Admitting: Family Medicine

## 2022-03-24 ENCOUNTER — Other Ambulatory Visit (HOSPITAL_COMMUNITY): Payer: Self-pay | Admitting: Family Medicine

## 2022-03-24 DIAGNOSIS — I82402 Acute embolism and thrombosis of unspecified deep veins of left lower extremity: Secondary | ICD-10-CM

## 2022-04-22 ENCOUNTER — Other Ambulatory Visit: Payer: Self-pay | Admitting: Cardiology

## 2022-04-29 ENCOUNTER — Encounter: Payer: Self-pay | Admitting: Internal Medicine

## 2022-04-29 ENCOUNTER — Other Ambulatory Visit: Payer: Self-pay

## 2022-04-29 ENCOUNTER — Ambulatory Visit (INDEPENDENT_AMBULATORY_CARE_PROVIDER_SITE_OTHER): Payer: PPO | Admitting: Internal Medicine

## 2022-04-29 VITALS — BP 124/78 | HR 52 | Temp 97.7°F | Ht 74.0 in | Wt 226.6 lb

## 2022-04-29 DIAGNOSIS — K219 Gastro-esophageal reflux disease without esophagitis: Secondary | ICD-10-CM

## 2022-04-29 DIAGNOSIS — Z86010 Personal history of colonic polyps: Secondary | ICD-10-CM | POA: Diagnosis not present

## 2022-04-29 DIAGNOSIS — K5732 Diverticulitis of large intestine without perforation or abscess without bleeding: Secondary | ICD-10-CM

## 2022-04-29 DIAGNOSIS — R1319 Other dysphagia: Secondary | ICD-10-CM

## 2022-04-29 MED ORDER — PANTOPRAZOLE SODIUM 40 MG PO TBEC: 40.0000 mg | DELAYED_RELEASE_TABLET | Freq: Every day | ORAL | 11 refills | Status: DC

## 2022-04-29 NOTE — Progress Notes (Unsigned)
Primary Care Physician:  [name], MD Primary Gastroenterologist:  Dr. Gala Romney  Pre-Procedure History & Physical: HPI:  [name] is a 66 y.o. male here for history of COPD, noncritical CAD, and GERD comes to me for follow-up.  Had an episode of chest pain in [facility] while on vacation.  Lasted no more than 3 minutes however, had associated shortness of breath for couple hours went to the ED was evaluated.  Apparent evaluation negative for cardiac ischemia or pulmonary embolus.  He is has history of factor V Leiden deficiency and is chronically anticoagulated with Eliquis.  He does have history of pulmonary embolus.  Has known reflux esophagitis with stricture requiring dilation previously.  He is not on a PPI at this time.  He is scheduled to see [place] cardiologist in follow-up in the near future.  It is notable that he does walk 5 miles a day.  Has not had any chest pain or shortness of breath since this episode. Cardiac catheterization 2022-revealing no more than 50% stenosis-no intervention required. Symptoms not consistent with his prior typical reflux symptoms.  History of complicated diverticulitis with abscess (least 3 documented episodes previously) for which he underwent a rectosigmoid resection and incidental appendectomy by Dr. Marcello Moores in [place] in July of this year.  He recovered uneventfully.  Multiple inflammatory polyps associated with the resected specimen.  History of colonic adenoma; due for surveillance colonoscopy 2027.  Past Medical History:  Diagnosis Date   Bilateral shoulder bursitis    Chronic back pain    COPD (chronic obstructive pulmonary disease) with emphysema ([name])    DDD (degenerative disc disease), lumbar    hx epideral injection  L5--S1    Diverticulitis    DVT (deep venous thrombosis) ([place]) 11/19/2018   Dysphasia    intermittant w/ food    Dyspnea    Dyspnea on exertion    Fatty liver    GERD (gastroesophageal reflux disease)    Gout     L great toe   Heterozygous factor V Leiden mutation ([place])    Hiatal hernia    Hip problem 2020   left, seeing orthopedics   History of acute pancreatitis    alcoholic pancreatitis [id number] and 01-30-2012. 2021   History of colon polyps    10-17-2005  hyperplastic polyp's   History of esophageal stricture    s/p  dilation 02-02-2017   History of peptic ulcer 1990s   History of pneumococcal pneumonia 2006   bilateral pneumonia w/ left lower lobe abscess treated w/ chest tube with suction for drainage   History of pneumothorax    1984--  left spontaneous pneumothorax ,  treated w/ chest tube   Hypertension    Malignant neoplasm of prostate [facility]) urologist-  dr dahlstedt/  oncologist -- dr Tammi Klippel   dx 12-02-2016-- Stage T2b, Gleason 3+4,  PSA 5.99,  vol 25cc   Neuropathy    Numbness of left foot    outer left ankle numb-- per pt has appt. w/ neurologist   OSA (obstructive sleep apnea)    per pt has not used cpap over year ago from 04-16-2017--- per study 01-17-2010  severe osa   Pneumonia    Solid nodule of lung greater than 8 mm in diameter 05/11/2018   03/2018: RUL Nodule, 82m   Wears glasses     Past Surgical History:  Procedure Laterality Date   BIOPSY  02/02/2017   Procedure: BIOPSY;  Surgeon: RDaneil Dolin MD;  [facility];  Service: Endoscopy;;  duodenal bx's, esophgeal bx's   BIOPSY  07/30/2017   Procedure: BIOPSY;  Surgeon: Daneil Dolin, MD;  [facility];  Service: Endoscopy;;  esophagus   BIOPSY  02/15/2021   Procedure: BIOPSY;  Surgeon: Daneil Dolin, MD;  [facility];  Service: Endoscopy;;   CARDIOVASCULAR STRESS TEST  09/12/2009   normal nuclear perfusion study w/ no ischemia /  normal LV function and wall motion , ef 57%   COLONOSCOPY  2007   Dr. Gala Romney: hyperplastic polyps, internal hemorrhoids    COLONOSCOPY WITH PROPOFOL N/A 05/03/2018   Dr. Gala Romney: diverticulosis in entire colon, multiple polyps in sigmoid, at  splenic flexure, and ascending colon, not all polyps removed. Tubular adenomas and inflammatory polyps. Needs 3 year surveilance   COLONOSCOPY WITH PROPOFOL N/A 02/15/2021   Many polyps without adenomatous changes but all inflammatory in nature with mildly active colitis and chronic inflammatory mucosa.   EPIDURAL BLOCK INJECTION  2020   ESOPHAGOGASTRODUODENOSCOPY (EGD) WITH PROPOFOL N/A 02/02/2017   Dr. Gala Romney: esophageal stenosis s/p dilation and biopsy, medium sized hiatal hernia, normal duodenum   ESOPHAGOGASTRODUODENOSCOPY (EGD) WITH PROPOFOL N/A 07/30/2017   Dr. Gala Romney: esophageal stenosis s/p dilation and biopsy, medium-sized hiatal hernia, normal duodenum   HEMORRHOID SURGERY  01-21-2008    [facility] Na   HIP ARTHROPLASTY Bilateral    INTRAVASCULAR PRESSURE WIRE/FFR STUDY N/A 08/01/2021   Procedure: INTRAVASCULAR PRESSURE WIRE/FFR STUDY;  Surgeon: Burnell Blanks, MD;  Location: [place] [facility];  Service: Cardiovascular;  Laterality: N/A;   IR RADIOLOGIST EVAL & MGMT  08/29/2020   LEFT HEART CATH AND CORONARY ANGIOGRAPHY N/A 08/01/2021   Procedure: LEFT HEART CATH AND CORONARY ANGIOGRAPHY;  Surgeon: Burnell Blanks, MD;  Location: [place] [facility];  Service: Cardiovascular;  Laterality: N/A;   MALONEY DILATION N/A 02/02/2017   Procedure: Venia Minks DILATION;  Surgeon: Daneil Dolin, MD;  [facility];  Service: Endoscopy;  Laterality: N/A;   MALONEY DILATION N/A 07/30/2017   Procedure: Venia Minks DILATION;  Surgeon: Daneil Dolin, MD;  [facility];  Service: Endoscopy;  Laterality: N/A;   POLYPECTOMY  05/03/2018   Procedure: POLYPECTOMY;  Surgeon: Daneil Dolin, MD;  [facility];  Service: Endoscopy;;  ascending colon polyp, sigmoid colon polyps (multiple)   POLYPECTOMY  02/15/2021   Procedure: POLYPECTOMY;  Surgeon: Daneil Dolin, MD;  [facility];  Service: Endoscopy;;   RADIOACTIVE SEED IMPLANT N/A 04/23/2017    Procedure: RADIOACTIVE SEED IMPLANT/BRACHYTHERAPY IMPLANT;  Surgeon: Franchot Gallo, MD;  Location: [facility];  Service: Urology;  Laterality: N/A;   SHOULDER ARTHROSCOPY  08/05/2003   SPACE OAR INSTILLATION N/A 04/23/2017   Procedure: SPACE OAR INSTILLATION;  Surgeon: Franchot Gallo, MD;  Location: [facility];  Service: Urology;  Laterality: N/A;   TRANSTHORACIC ECHOCARDIOGRAM  03/28/2009   mild LVH, ef 55-60%/ mild aorta calcification   VASECTOMY  08/05/1983   VIDEO ASSISTED THORACOSCOPY (VATS)/DECORTICATION Left     Prior to Admission medications   Medication Sig Start Date End Date Taking? Authorizing Provider  acetaminophen (TYLENOL) 500 MG tablet Take 2 tablets (1,000 mg total) by mouth every 6 (six) hours as needed for mild pain. 08/21/20  Yes Meuth, Brooke A, PA-C  albuterol (PROVENTIL) (2.5 MG/3ML) 0.083% nebulizer solution Take 2.5 mg by nebulization every 6 (six) hours as needed for wheezing or shortness of breath.   Yes [provider]  albuterol (VENTOLIN HFA) 108 (90 Base) MCG/ACT inhaler Inhale 2 puffs into the lungs every 6 (six) hours as needed for wheezing or shortness of breath.   Yes [provider]  allopurinol (ZYLOPRIM) 300 MG tablet Take 300 mg by mouth daily. 09/17/20  Yes [provider]  apixaban (ELIQUIS) 5 MG TABS tablet Take 1 tablet (5 mg total) by mouth 2 (two) times daily. 08/25/21 04/29/22 Yes Shah, Pratik D, DO  doxycycline (VIBRAMYCIN) 50 MG capsule Take 50 mg by mouth daily. 01/15/22  Yes [provider]  Fluticasone-Umeclidin-Vilant (TRELEGY ELLIPTA) 100-62.5-25 MCG/ACT AEPB Inhale 1 puff into the lungs daily. 01/16/22  Yes Icard, Bradley L, DO  metoprolol succinate (TOPROL-XL) 25 MG 24 hr tablet TAKE 1 TABLET(25 MG) BY MOUTH DAILY 04/22/22  Yes Richardo Priest, MD  NON FORMULARY Pt uses a cpap nightly   Yes [provider]  rosuvastatin (CRESTOR) 20 MG tablet TAKE 1 TABLET(20 MG)  BY MOUTH DAILY 04/22/22  Yes Richardo Priest, MD    Allergies as of 04/29/2022   (No Known Allergies)    Family History  Problem Relation Age of Onset   Diabetes Mother    Heart disease Father    Heart disease Brother    Cancer Neg Hx    Colon cancer Neg Hx    Colon polyps Neg Hx    Neuropathy Neg Hx     Social History   Socioeconomic History   Marital status: Married    Spouse name: Not on file   Number of children: 2   Years of education: Not on file   Highest education level: Bachelor's degree (e.g., BA, AB, BS)  Occupational History   Not on file  Tobacco Use   Smoking status: Former    Packs/day: 1.00    Years: 30.00    Total pack years: 30.00    Types: Cigarettes, E-cigarettes    Quit date: 10/03/2015    Years since quitting: 6.5    Passive exposure: Past   Smokeless tobacco: Never   Tobacco comments:    stoped vaping 2018  Vaping Use   Vaping Use: Former  Substance and Sexual Activity   Alcohol use: Yes    Alcohol/week: 5.0 standard drinks of alcohol    Types: 5 Glasses of wine per week   Drug use: No   Sexual activity: Yes    Birth control/protection: None  Other Topics Concern   Not on file  Social History Narrative   Lives at home with his wife   Right handed   Social Determinants of Health   Financial Resource Strain: Not on file  Food Insecurity: Not on file  Transportation Needs: Not on file  Physical Activity: Not on file  Stress: Not on file  Social Connections: Not on file  Intimate Partner Violence: Not on file    Review of Systems: See HPI, otherwise negative ROS  Physical Exam: BP 124/78 (BP Location: Left Arm, Patient Position: Sitting, Cuff Size: Large)   Pulse (!) 52   Temp 97.7 F (36.5 C) (Temporal)   Ht '6\' 2"'$  (1.88 m)   Wt 226 lb 9.6 oz (102.8 kg)   SpO2 95%   BMI 29.09 kg/m  General:   Alert,  Well-developed, well-nourished, pleasant and cooperative in NAD Mouth:  No deformity or lesions. Neck:  Supple; no masses  or thyromegaly. No significant cervical adenopathy. Lungs:  Clear throughout to auscultation.   No wheezes, crackles, or rhonchi. No acute distress. Heart:  Regular rate and rhythm; no murmurs, clicks, rubs,  or gallops. Abdomen: Non-distended, normal bowel sounds.  Soft and nontender without appreciable mass or hepatosplenomegaly.  Pulses:  Normal pulses noted. Extremities:  Without clubbing or edema.  Impression/Plan: 66 year old gentleman with history of COPD, CAD factor V Leiden deficiency history of pulmonary embolus and GERD here for follow-up of recent episode of fleeting self-limiting chest pain but a prolonged episode of shortness of breath.  Work-up in ED in [facility] failed to demonstrate acute coronary or pulmonary etiology. He should be taking PPI for GERD along the way he has come off of acid suppression therapy. Recent chest pain seems atypical to me.  It lasted no more than 2 to 3 minutes without any intervention.  However, his dyspnea lasted 2 to 3 hours as she reports.  This is of uncertain significance but not likely related to reflux. Acute work-up in ED negative for acute ischemia or pulmonary embolus. He has been exerting himself fairly regularly since this episode without any recurrent symptoms upon exertion. He likely has an element of background GERD and needs to be on acid suppression therapy.  However, I am not convinced that his recent acute symptoms of chest pain/dyspnea were reflux related.  Recommendations  We will start Protonix 40 mg once daily 30 minutes before breakfast.  Dispense 30 with 11 refills  In your case, your small hiatal hernia has nothing to do with your recent symptoms.  It is unclear to me whether or not your recent symptoms of chest pain and shortness of breath have anything to do with your GI tract or not.  No need for further GI evaluation at this time.  Given your known heart history, I strongly recommend you follow-up with your  cardiologist and let him review your recent symptoms.  We will plan for a surveillance colonoscopy (history of colonic polyps) in 2027.  If you have recurrent chest pain and/or shortness of breath go directly to the emergency department.  Office follow-up with me in 6 months   Notice: This dictation was prepared with Dragon dictation along with smaller phrase technology. Any transcriptional errors that result from this process are unintentional and may not be corrected upon review.

## 2022-04-29 NOTE — Patient Instructions (Signed)
It was good to see you again today!  You have a history of acid reflux and should be on a once a day antiacid medication  We will start Protonix 40 mg once daily 30 minutes before breakfast.  Dispense 30 with 11 refills  In your case, your small hiatal hernia has nothing to do with your recent symptoms.  It is unclear to me whether or not your recent symptoms of chest pain and shortness of breath have anything to do with your GI tract or not.  No need for further GI evaluation at this time.  Given your known heart history, I strongly recommend you follow-up with your cardiologist and let him review your recent symptoms.  We will plan for a surveillance colonoscopy (history of colonic polyps) in 2027.  If you have recurrent chest pain and/or shortness of breath go directly to the emergency department.  Office follow-up with me in 6 months

## 2022-05-09 ENCOUNTER — Other Ambulatory Visit: Payer: Self-pay | Admitting: *Deleted

## 2022-05-09 DIAGNOSIS — Z87891 Personal history of nicotine dependence: Secondary | ICD-10-CM

## 2022-05-09 DIAGNOSIS — Z122 Encounter for screening for malignant neoplasm of respiratory organs: Secondary | ICD-10-CM

## 2022-05-27 ENCOUNTER — Ambulatory Visit (HOSPITAL_COMMUNITY)
Admission: RE | Admit: 2022-05-27 | Discharge: 2022-05-27 | Disposition: A | Discharge status: Home / Self Care | Payer: PPO | Source: Ambulatory Visit | Attending: Nurse Practitioner | Admitting: Nurse Practitioner

## 2022-05-27 ENCOUNTER — Other Ambulatory Visit (HOSPITAL_COMMUNITY): Payer: Self-pay | Admitting: Nurse Practitioner

## 2022-05-27 DIAGNOSIS — R059 Cough, unspecified: Secondary | ICD-10-CM | POA: Insufficient documentation

## 2022-07-01 ENCOUNTER — Other Ambulatory Visit (HOSPITAL_COMMUNITY): Payer: Self-pay | Admitting: Nurse Practitioner

## 2022-07-01 DIAGNOSIS — Z136 Encounter for screening for cardiovascular disorders: Secondary | ICD-10-CM

## 2022-07-01 DIAGNOSIS — Z87891 Personal history of nicotine dependence: Secondary | ICD-10-CM

## 2022-07-01 DIAGNOSIS — M79604 Pain in right leg: Secondary | ICD-10-CM

## 2022-07-08 ENCOUNTER — Ambulatory Visit (HOSPITAL_COMMUNITY)
Admission: RE | Admit: 2022-07-08 | Discharge: 2022-07-08 | Disposition: A | Discharge status: Home / Self Care | Payer: PPO | Source: Ambulatory Visit | Attending: Nurse Practitioner | Admitting: Nurse Practitioner

## 2022-07-08 ENCOUNTER — Other Ambulatory Visit: Payer: Self-pay | Admitting: Pulmonary Disease

## 2022-07-08 DIAGNOSIS — I82431 Acute embolism and thrombosis of right popliteal vein: Secondary | ICD-10-CM | POA: Diagnosis not present

## 2022-07-08 DIAGNOSIS — Z136 Encounter for screening for cardiovascular disorders: Secondary | ICD-10-CM

## 2022-07-08 DIAGNOSIS — M79604 Pain in right leg: Secondary | ICD-10-CM | POA: Insufficient documentation

## 2022-07-08 DIAGNOSIS — Z87891 Personal history of nicotine dependence: Secondary | ICD-10-CM | POA: Diagnosis present

## 2022-07-08 DIAGNOSIS — M79605 Pain in left leg: Secondary | ICD-10-CM | POA: Diagnosis present

## 2022-07-08 DIAGNOSIS — J449 Chronic obstructive pulmonary disease, unspecified: Secondary | ICD-10-CM

## 2022-07-08 DIAGNOSIS — I714 Abdominal aortic aneurysm, without rupture, unspecified: Secondary | ICD-10-CM | POA: Diagnosis not present

## 2022-07-09 NOTE — Progress Notes (Signed)
[facility] [place], [place] [address]   CLINIC:  Medical Oncology/Hematology  PCP:  [name], MD [place] [address] [phone]   REASON FOR VISIT:  Follow-up for recurrent DVT and PE  PRIOR THERAPY: Xarelto (10/02/2020 through 02/01/2021)   CURRENT THERAPY: Eliquis  INTERVAL HISTORY:  [name] 66 y.o. male returns for follow-up of his history of recurrent DVT and PE.  He was last seen by Tarri Abernethy PA-C on 01/21/2022.  Patient returns to clinic today due to suspected recurrent right leg DVT.  Patient noticed increased bilateral leg pain and was sent for [place] by his PCP.  He insists that he has been compliant with his Eliquis 5 mg twice daily, without any missed doses.  He does admit to drinking 4-5 alcoholic beverages daily.  He has intermittent epigastric/substernal pain in the evenings that has been intermittent.  He denies any chest pain, hemoptysis, palpitations, or new onset shortness of breath. He denies any major bleeding events while on Eliquis.  Prior bilateral vascular ultrasound of lower extremities on 08/17/2021 and showed "nonocclusive deep venous thrombosis in the RIGHT popliteal, LEFT popliteal, LEFT posterior tibial, and LEFT peroneal veins."  Unilateral (LEFT leg) ultrasound follow-up on 03/24/2022 showed resolution of left leg DVT; right leg was not assessed.  Venous ultrasound (07/08/2022) showed occlusive DVT in RIGHT popliteal vein likely extending into right peroneal veins; there was no evidence of DVT in left lower extremity.  Per discussion with radiologist (Dr. Laurence Ferrari) this is thought to represent an ACUTE DVT of his right lower extremity.  He has 75% energy and 90% appetite. He endorses that he is maintaining a stable weight.   REVIEW OF SYSTEMS: Review of Systems  Constitutional:  Positive for fatigue. Negative for appetite change, chills, diaphoresis, fever and unexpected weight change.  HENT:    Negative for lump/mass and nosebleeds.   Eyes:  Negative for eye problems.  Respiratory:  Positive for cough and shortness of breath (COPD). Negative for hemoptysis.   Cardiovascular:  Negative for chest pain, leg swelling and palpitations.  Gastrointestinal:  Positive for nausea. Negative for abdominal pain, blood in stool, constipation, diarrhea and vomiting.  Genitourinary:  Negative for hematuria.   Skin: Negative.   Neurological:  Negative for dizziness, headaches and light-headedness.  Hematological:  Does not bruise/bleed easily.      PAST MEDICAL/SURGICAL HISTORY:  Past Medical History:  Diagnosis Date   Bilateral shoulder bursitis    Chronic back pain    COPD (chronic obstructive pulmonary disease) with emphysema ([name])    DDD (degenerative disc disease), lumbar    hx epideral injection  L5--S1    Diverticulitis    DVT (deep venous thrombosis) ([place]) 11/19/2018   Dysphasia    intermittant w/ food    Dyspnea    Dyspnea on exertion    Fatty liver    GERD (gastroesophageal reflux disease)    Gout    L great toe   Heterozygous factor V Leiden mutation ([place] [facility])    Hiatal hernia    Hip problem 2020   left, seeing orthopedics   History of acute pancreatitis    alcoholic pancreatitis [id number] and 01-30-2012. 2021   History of colon polyps    10-17-2005  hyperplastic polyp's   History of esophageal stricture    s/p  dilation 02-02-2017   History of peptic ulcer 1990s   History of pneumococcal pneumonia 2006   bilateral pneumonia w/ left lower lobe abscess  treated w/ chest tube with suction for drainage   History of pneumothorax    1984--  left spontaneous pneumothorax ,  treated w/ chest tube   Hypertension    Malignant neoplasm of prostate [facility]) urologist-  dr dahlstedt/  oncologist -- dr Tammi Klippel   dx 12-02-2016-- Stage T2b, Gleason 3+4,  PSA 5.99,  vol 25cc   Neuropathy    Numbness of left foot    outer left ankle numb-- per pt has appt. w/ neurologist   OSA  (obstructive sleep apnea)    per pt has not used cpap over year ago from 04-16-2017--- per study 01-17-2010  severe osa   Pneumonia    Solid nodule of lung greater than 8 mm in diameter 05/11/2018   03/2018: RUL Nodule, 73m   Wears glasses    Past Surgical History:  Procedure Laterality Date   BIOPSY  02/02/2017   Procedure: BIOPSY;  Surgeon: RDaneil Dolin MD;  [facility];  Service: Endoscopy;;  duodenal bx's, esophgeal bx's   BIOPSY  07/30/2017   Procedure: BIOPSY;  Surgeon: RDaneil Dolin MD;  [facility];  Service: Endoscopy;;  esophagus   BIOPSY  02/15/2021   Procedure: BIOPSY;  Surgeon: RDaneil Dolin MD;  [facility];  Service: Endoscopy;;   CARDIOVASCULAR STRESS TEST  09/12/2009   normal nuclear perfusion study w/ no ischemia /  normal LV function and wall motion , ef 57%   COLONOSCOPY  2007   Dr. RGala Romney hyperplastic polyps, internal hemorrhoids    COLONOSCOPY WITH PROPOFOL N/A 05/03/2018   Dr. RGala Romney diverticulosis in entire colon, multiple polyps in sigmoid, at splenic flexure, and ascending colon, not all polyps removed. Tubular adenomas and inflammatory polyps. Needs 3 year surveilance   COLONOSCOPY WITH PROPOFOL N/A 02/15/2021   Many polyps without adenomatous changes but all inflammatory in nature with mildly active colitis and chronic inflammatory mucosa.   EPIDURAL BLOCK INJECTION  2020   ESOPHAGOGASTRODUODENOSCOPY (EGD) WITH PROPOFOL N/A 02/02/2017   Dr. RGala Romney esophageal stenosis s/p dilation and biopsy, medium sized hiatal hernia, normal duodenum   ESOPHAGOGASTRODUODENOSCOPY (EGD) WITH PROPOFOL N/A 07/30/2017   Dr. RGala Romney esophageal stenosis s/p dilation and biopsy, medium-sized hiatal hernia, normal duodenum   HEMORRHOID SURGERY  01-21-2008    AForestine Na  HIP ARTHROPLASTY Bilateral    INTRAVASCULAR PRESSURE WIRE/FFR STUDY N/A 08/01/2021   Procedure: INTRAVASCULAR PRESSURE WIRE/FFR STUDY;  Surgeon: MBurnell Blanks  MD;  [facility];  Service: Cardiovascular;  Laterality: N/A;   IR RADIOLOGIST EVAL & MGMT  08/29/2020   LEFT HEART CATH AND CORONARY ANGIOGRAPHY N/A 08/01/2021   Procedure: LEFT HEART CATH AND CORONARY ANGIOGRAPHY;  Surgeon: MBurnell Blanks MD;  [facility];  Service: Cardiovascular;  Laterality: N/A;   MALONEY DILATION N/A 02/02/2017   Procedure: MVenia MinksDILATION;  Surgeon: RDaneil Dolin MD;  [facility];  Service: Endoscopy;  Laterality: N/A;   MALONEY DILATION N/A 07/30/2017   Procedure: MVenia MinksDILATION;  Surgeon: RDaneil Dolin MD;  [facility];  Service: Endoscopy;  Laterality: N/A;   POLYPECTOMY  05/03/2018   Procedure: POLYPECTOMY;  Surgeon: RDaneil Dolin MD;  [facility];  Service: Endoscopy;;  ascending colon polyp, sigmoid colon polyps (multiple)   POLYPECTOMY  02/15/2021   Procedure: POLYPECTOMY;  Surgeon: RDaneil Dolin MD;  [facility];  Service: Endoscopy;;   RADIOACTIVE SEED IMPLANT N/A 04/23/2017  Procedure: RADIOACTIVE SEED IMPLANT/BRACHYTHERAPY IMPLANT;  Surgeon: Franchot Gallo, MD;  Location: [facility];  Service: Urology;  Laterality: N/A;   SHOULDER ARTHROSCOPY  08/05/2003   SPACE OAR INSTILLATION N/A 04/23/2017   Procedure: SPACE OAR INSTILLATION;  Surgeon: Franchot Gallo, MD;  [facility];  Service: Urology;  Laterality: N/A;   TRANSTHORACIC ECHOCARDIOGRAM  03/28/2009   mild LVH, ef 55-60%/ mild aorta calcification   VASECTOMY  08/05/1983   VIDEO ASSISTED THORACOSCOPY (VATS)/DECORTICATION Left      SOCIAL HISTORY:  Social History   Socioeconomic History   Marital status: Married    Spouse name: Not on file   Number of children: 2   Years of education: Not on file   Highest education level: Bachelor's degree (e.g., BA, AB, BS)  Occupational History   Not on file  Tobacco Use   Smoking status: Former     Packs/day: 1.00    Years: 30.00    Total pack years: 30.00    Types: Cigarettes, E-cigarettes    Quit date: 10/03/2015    Years since quitting: 6.7    Passive exposure: Past   Smokeless tobacco: Never   Tobacco comments:    stoped vaping 2018  Vaping Use   Vaping Use: Former  Substance and Sexual Activity   Alcohol use: Yes    Alcohol/week: 5.0 standard drinks of alcohol    Types: 5 Glasses of wine per week   Drug use: No   Sexual activity: Yes    Birth control/protection: None  Other Topics Concern   Not on file  Social History Narrative   Lives at home with his wife   Right handed   Social Determinants of Health   Financial Resource Strain: Not on file  Food Insecurity: Not on file  Transportation Needs: Not on file  Physical Activity: Not on file  Stress: Not on file  Social Connections: Not on file  Intimate Partner Violence: Not on file    FAMILY HISTORY:  Family History  Problem Relation Age of Onset   Diabetes Mother    Heart disease Father    Heart disease Brother    Cancer Neg Hx    Colon cancer Neg Hx    Colon polyps Neg Hx    Neuropathy Neg Hx     CURRENT MEDICATIONS:  Outpatient Encounter Medications as of 07/10/2022  Medication Sig   acetaminophen (TYLENOL) 500 MG tablet Take 2 tablets (1,000 mg total) by mouth every 6 (six) hours as needed for mild pain.   albuterol (PROVENTIL) (2.5 MG/3ML) 0.083% nebulizer solution Take 2.5 mg by nebulization every 6 (six) hours as needed for wheezing or shortness of breath.   albuterol (VENTOLIN HFA) 108 (90 Base) MCG/ACT inhaler Inhale 2 puffs into the lungs every 6 (six) hours as needed for wheezing or shortness of breath.   allopurinol (ZYLOPRIM) 300 MG tablet Take 300 mg by mouth daily.   apixaban (ELIQUIS) 5 MG TABS tablet Take 1 tablet (5 mg total) by mouth 2 (two) times daily.   doxycycline (VIBRAMYCIN) 50 MG capsule Take 50 mg by mouth daily.   metoprolol succinate (TOPROL-XL) 25 MG 24 hr tablet TAKE 1  TABLET(25 MG) BY MOUTH DAILY   NON FORMULARY Pt uses a cpap nightly   pantoprazole (PROTONIX) 40 MG tablet Take 1 tablet (40 mg total) by mouth daily.   rosuvastatin (CRESTOR) 20 MG tablet TAKE 1 TABLET(20 MG) BY MOUTH DAILY   TRELEGY ELLIPTA 100-62.5-25 MCG/ACT AEPB INHALE  1 PUFF INTO THE LUNGS DAILY   No facility-administered encounter medications on file as of 07/10/2022.    ALLERGIES:  No Known Allergies   PHYSICAL EXAM: ECOG PERFORMANCE STATUS: 1 - Symptomatic but completely ambulatory  There were no vitals filed for this visit. There were no vitals filed for this visit. Physical Exam Constitutional:      Appearance: Normal appearance. He is obese.  HENT:     Head: Normocephalic and atraumatic.     Mouth/Throat:     Mouth: Mucous membranes are moist.  Eyes:     Extraocular Movements: Extraocular movements intact.     Pupils: Pupils are equal, round, and reactive to light.  Cardiovascular:     Rate and Rhythm: Normal rate and regular rhythm.     Pulses: Normal pulses.     Heart sounds: Normal heart sounds.  Pulmonary:     Effort: Pulmonary effort is normal.     Breath sounds: Normal breath sounds.  Abdominal:     General: Bowel sounds are normal.     Palpations: Abdomen is soft.     Tenderness: There is no abdominal tenderness.  Musculoskeletal:        General: No swelling.     Right lower leg: Edema (1+) present.     Left lower leg: Edema (trace) present.  Lymphadenopathy:     Cervical: No cervical adenopathy.  Skin:    General: Skin is warm and dry.     Comments: Dusky red hyperpigmentation of distal bilateral lower extremities  Neurological:     General: No focal deficit present.     Mental Status: He is alert and oriented to person, place, and time.  Psychiatric:        Mood and Affect: Mood normal.        Behavior: Behavior normal.      LABORATORY DATA:  I have reviewed the labs as listed.  CBC    Component Value Date/Time   WBC 11.7 (H)  02/14/2022 0446   RBC 4.12 (L) 02/14/2022 0446   HGB 14.0 02/14/2022 0446   HGB 15.6 07/30/2021 1342   HCT 42.3 02/14/2022 0446   HCT 44.8 07/30/2021 1342   PLT 160 02/14/2022 0446   PLT 162 07/30/2021 1342   MCV 102.7 (H) 02/14/2022 0446   MCV 100 (H) 07/30/2021 1342   MCH 34.0 02/14/2022 0446   MCHC 33.1 02/14/2022 0446   RDW 13.6 02/14/2022 0446   RDW 12.7 07/30/2021 1342   LYMPHSABS 2.4 01/20/2022 0911   MONOABS 0.7 01/20/2022 0911   EOSABS 0.2 01/20/2022 0911   BASOSABS 0.0 01/20/2022 0911      Latest Ref Rng & Units 02/14/2022    4:46 AM 02/13/2022    4:38 AM 01/17/2022   10:20 AM  CMP  Glucose 70 - 99 mg/dL 119  199  120   BUN 8 - 23 mg/dL '12  7  12   '$ Creatinine 0.61 - 1.24 mg/dL 0.71  0.68  0.74   Sodium 135 - 145 mmol/L 144  140  141   Potassium 3.5 - 5.1 mmol/L 4.2  4.7  3.9   Chloride 98 - 111 mmol/L 109  107  108   CO2 22 - 32 mmol/L '29  25  25   '$ Calcium 8.9 - 10.3 mg/dL 9.4  9.1  9.1   Total Protein 6.5 - 8.1 g/dL   6.7   Total Bilirubin 0.3 - 1.2 mg/dL   0.6   Alkaline Phos 38 -  126 U/L   65   AST 15 - 41 U/L   68   ALT 0 - 44 U/L   49     DIAGNOSTIC IMAGING:  I have independently reviewed the relevant imaging and discussed with the patient.  ASSESSMENT & PLAN: 1.  Recurrent PE and DVT, factor V Leiden heterozygosity - Previously evaluated by hematology clinic for recurrent DVTs (October 2019 and January 2022), which were both felt to be a provoked - Completed Xarelto x4 months (10/02/2020 through 02/01/2021) - Venous duplex ultrasound (01/23/2021) showed resolution of right lower extremity DVT without any residual thrombus - Hematology work-up showed factor V Leiden heterozygosity, but since both of his prior DVTs were provoked, no lifelong anticoagulation was initially recommended. - Patient presented to hospital on 08/16/2021 with complaints of progressive dyspnea x1 month, was found to have acute hypoxemic respiratory failure in the setting of a bilateral PE,  but with no signs of right ventricular strain on CT scan.  Ultrasound of lower extremities showed bilateral DVTs.  He was started on IV heparin drip while hospitalized, and transition to Eliquis at discharge.  At the time of discharge, his symptoms had improved and he no longer required off was on supplementation. - Recurrent acute right leg DVT in December 2023 despite compliance with Eliquis: Prior bilateral vascular ultrasound of lower extremities on 08/17/2021 and showed "nonocclusive deep venous thrombosis in the RIGHT popliteal, LEFT popliteal, LEFT posterior tibial, and LEFT peroneal veins."  Unilateral (LEFT leg) ultrasound follow-up on 03/24/2022 showed resolution of left leg DVT; right leg was not assessed.  Venous ultrasound (07/08/2022) showed occlusive DVT in right popliteal vein likely extending into right peroneal veins; there was no evidence of DVT in left lower extremity.   Per my discussion with radiologist (Dr. Laurence Ferrari), this is felt to represent a NEW right leg blood clot when compared to previous study - He denies any major bleeding events. - He denies any symptoms of PE such as chest pain, palpitations, hemoptysis, or shortness of breath.   - PLAN: Recommend indefinite anticoagulation.  We have discussed risks and benefits of anticoagulation in depth, and patient is agreeable to proceeding. - Since patient has [place], we will switch him to warfarin with temporary Lovenox bridging. Patient instructed to STOP all alcohol, since this can interfere with his warfarin. Education on warfarin dosing, vitamin K, medication interactions, and INR provided to patient. Since he is already on several medications that can increase the effect of warfarin, we will start him on warfarin 2.5 mg nightly. We will bridge with Lovenox 1 mg/kg twice daily until INR 2.0-3.0. INR check on Monday, 07/14/2022, will call patient with further details and instructions. We will refer to Coumadin clinic in  [facility] for assistance with warfarin monitoring and dosing. - RTC in 6 months (or sooner if needed) for reassessment and ongoing evaluation of risks and benefits of chronic anticoagulation.    2.  Prostate cancer: -TRUS/biopsy on 12/02/2016.  4 mm left apical nodule, PSA 5.99.  6/12 cores positive for adenocarcinoma, all on the left side of the prostate.  3 cores revealed GS 3+3, 3 cores with GS 3+4.  He underwent I-125 brachytherapy on 04/23/2017. - Latest PSA on 12/10/2021 was < 0.1 - PLAN: Continue follow-up with Dr. Diona Fanti     PLAN SUMMARY: >> Lovenox injection today >> Rx sent to pharmacy for warfarin with Lovenox bridging >> Please REFER to [facility] cardiology in [facility] for Coumadin clinic >> INR check on Monday, 07/14/2022 >  will CALL patient with results and further instructions >> Follow-up as scheduled in June 2024, or sooner if needed   All questions were answered. The patient knows to call the clinic with any problems, questions or concerns.  Medical decision making: Moderate  Time spent on visit: I spent 40 minutes counseling the patient face to face. The total time spent in the appointment was 60 minutes and more than 50% was on counseling.   Harriett Rush, PA-C  07/10/22 10:04 AM

## 2022-07-10 ENCOUNTER — Other Ambulatory Visit: Payer: Self-pay

## 2022-07-10 ENCOUNTER — Telehealth: Payer: Self-pay | Admitting: Cardiology

## 2022-07-10 ENCOUNTER — Inpatient Hospital Stay: Payer: PPO

## 2022-07-10 ENCOUNTER — Inpatient Hospital Stay: Payer: PPO | Attending: Physician Assistant | Admitting: Physician Assistant

## 2022-07-10 VITALS — BP 140/98 | HR 60 | Temp 98.6°F | Resp 18 | Ht 74.0 in | Wt 238.5 lb

## 2022-07-10 DIAGNOSIS — Z79899 Other long term (current) drug therapy: Secondary | ICD-10-CM | POA: Insufficient documentation

## 2022-07-10 DIAGNOSIS — Z86711 Personal history of pulmonary embolism: Secondary | ICD-10-CM | POA: Insufficient documentation

## 2022-07-10 DIAGNOSIS — Z87891 Personal history of nicotine dependence: Secondary | ICD-10-CM | POA: Insufficient documentation

## 2022-07-10 DIAGNOSIS — I82431 Acute embolism and thrombosis of right popliteal vein: Secondary | ICD-10-CM | POA: Diagnosis not present

## 2022-07-10 DIAGNOSIS — Z86718 Personal history of other venous thrombosis and embolism: Secondary | ICD-10-CM | POA: Diagnosis present

## 2022-07-10 DIAGNOSIS — Z8546 Personal history of malignant neoplasm of prostate: Secondary | ICD-10-CM | POA: Diagnosis not present

## 2022-07-10 DIAGNOSIS — D6851 Activated protein C resistance: Secondary | ICD-10-CM | POA: Insufficient documentation

## 2022-07-10 DIAGNOSIS — Z7901 Long term (current) use of anticoagulants: Secondary | ICD-10-CM | POA: Diagnosis not present

## 2022-07-10 DIAGNOSIS — I2694 Multiple subsegmental pulmonary emboli without acute cor pulmonale: Secondary | ICD-10-CM

## 2022-07-10 MED ORDER — WARFARIN SODIUM 2.5 MG PO TABS: 2.5000 mg | ORAL_TABLET | Freq: Once | ORAL | 0 refills | Status: DC

## 2022-07-10 MED ORDER — ENOXAPARIN SODIUM 100 MG/ML IJ SOSY: 100.0000 mg | PREFILLED_SYRINGE | Freq: Two times a day (BID) | INTRAMUSCULAR | 0 refills | Status: DC

## 2022-07-10 MED ORDER — ENOXAPARIN SODIUM 100 MG/ML IJ SOSY
100.0000 mg | PREFILLED_SYRINGE | Freq: Once | INTRAMUSCULAR | Status: AC
  Administered 2022-07-10: 100 mg via SUBCUTANEOUS
  Filled 2022-07-10: qty 1

## 2022-07-10 NOTE — Progress Notes (Signed)
Reviewed Lovenox injections with instructions given for home.  All questions asked and answered.    Patient tolerated injection with no complaints voiced.  Site clean and dry with no bruising or swelling noted at site.  See MAR for details.  Band aid applied.  Patient stable during and after injection.  Vss with discharge and left in satisfactory condition with no s/s of distress noted.

## 2022-07-10 NOTE — Addendum Note (Signed)
Addended by: Tarri Abernethy on: 07/10/2022 04:08 PM   Modules accepted: Orders

## 2022-07-10 NOTE — Patient Instructions (Addendum)
[facility] [place] **   You were seen today by Tarri Abernethy PA-C for your blood clots.  As we discussed, you appear to have a NEW deep vein thrombosis (blood clot) in your right leg, despite taking Eliquis blood thinner.  Because of this, you are considered to have "failed Eliquis," which means that we will need to switch you to a different type of blood thinner, called warfarin (brand name = Coumadin). We will start you on warfarin 2.5 mg each night.  Take your first dose tonight. It takes warfarin several days before it is completely active in your body.  Until the warfarin is active in your body (evidenced by INR 2-3), you will also need to take blood thinner shots (Lovenox injections), which will be given twice daily.  You received your first Lovenox shot today.  Take your next Lovenox shot at home tonight. We will check a blood test (INR) on Monday to see if the warfarin has made your blood thin enough. We will also refer you to the Coumadin Clinic at [facility] cardiology office in [place].  They will take over monitoring and dosing your warfarin.  Until you have been seen by them, we will take care of this. Make sure that you take your warfarin at the same time every day.  Make sure that you eat a stable amount of vitamin K in your diet.  Do not drink any alcohol. Please see the attached handout for important information about Lovenox and warfarin.  !! ATTENTION !! - You CANNOT drink ANY alcohol while taking warfarin.  ** Seek IMMEDIATE MEDICAL ATTENTION if you have any new chest pain, palpitations/racing heartbeat, dizziness/passing out, or difficulty breathing.  These could all be signs of a dangerous and life-threatening blood clot in your lungs.  (Please read the attached handouts for more information).  ** Seek IMMEDIATE MEDICAL ATTENTION if you have any signs of major bleeding events while taking  warfarin.  FOLLOW-UP APPOINTMENT: Follow-up as scheduled in June 2024, or sooner if needed.  ** Thank you for trusting me with your healthcare!  I strive to provide all of my patients with quality care at each visit.  If you receive a survey for this visit, I would be so grateful to you for taking the time to provide feedback.  Thank you in advance!  ~ Josselin Gaulin                   Dr. Derek Jack   &   Tarri Abernethy, PA-C   - - - - - - - - - - - - - - - - - -    Thank you for choosing [place] at [facility] to provide your oncology and hematology care.  To afford each patient quality time with our provider, please arrive at least 15 minutes before your scheduled appointment time.   If you have a lab appointment with the [facility] please come in thru the Main Entrance and check in at the main information desk.  You need to re-schedule your appointment should you arrive 10 or more minutes late.  We strive to give you quality time with our providers, and arriving late affects you and other patients whose appointments are after yours.  Also, if you no show three or more times for appointments you may be dismissed from the clinic at the providers discretion.     Again, thank  you for choosing [facility].  Our hope is that these requests will decrease the amount of time that you wait before being seen by our physicians.       _____________________________________________________________  Should you have questions after your visit to [facility], please contact our office at [phone] and follow the prompts.  Our office hours are 8:00 a.m. and 4:30 p.m. Monday - Friday.  Please note that voicemails left after 4:00 p.m. may not be returned until the following business day.  We are closed weekends and major holidays.  You do have access to a nurse 24-7, just call the main number to the clinic [phone] and do not press any options,  hold on the line and a nurse will answer the phone.    For prescription refill requests, have your pharmacy contact our office and allow 72 hours.

## 2022-07-10 NOTE — Patient Instructions (Signed)
[facility]  Discharge Instructions: Thank you for choosing [facility] to provide your oncology and hematology care.  If you have a lab appointment with the Cancer Center, please come in thru the Main Entrance and check in at the main information desk.  Wear comfortable clothing and clothing appropriate for easy access to any Portacath or PICC line.   We strive to give you quality time with your provider. You may need to reschedule your appointment if you arrive late (15 or more minutes).  Arriving late affects you and other patients whose appointments are after yours.  Also, if you miss three or more appointments without notifying the office, you may be dismissed from the clinic at the provider's discretion.      For prescription refill requests, have your pharmacy contact our office and allow 72 hours for refills to be completed.     To help prevent nausea and vomiting after your treatment, we encourage you to take your nausea medication as directed.  BELOW ARE SYMPTOMS THAT SHOULD BE REPORTED IMMEDIATELY: *FEVER GREATER THAN 100.4 F (38 C) OR HIGHER *CHILLS OR SWEATING *NAUSEA AND VOMITING THAT IS NOT CONTROLLED WITH YOUR NAUSEA MEDICATION *UNUSUAL SHORTNESS OF BREATH *UNUSUAL BRUISING OR BLEEDING *URINARY PROBLEMS (pain or burning when urinating, or frequent urination) *BOWEL PROBLEMS (unusual diarrhea, constipation, pain near the anus) TENDERNESS IN MOUTH AND THROAT WITH OR WITHOUT PRESENCE OF ULCERS (sore throat, sores in mouth, or a toothache) UNUSUAL RASH, SWELLING OR PAIN  UNUSUAL VAGINAL DISCHARGE OR ITCHING   Items with * indicate a potential emergency and should be followed up as soon as possible or go to the Emergency Department if any problems should occur.  Please show the CHEMOTHERAPY ALERT CARD or IMMUNOTHERAPY ALERT CARD at check-in to the Emergency Department and triage nurse.  Should you have questions after your visit or need to  cancel or reschedule your appointment, please contact [facility] [phone]  and follow the prompts.  Office hours are 8:00 a.m. to 4:30 p.m. Monday - Friday. Please note that voicemails left after 4:00 p.m. may not be returned until the following business day.  We are closed weekends and major holidays. You have access to a nurse at all times for urgent questions. Please call the main number to the clinic [phone] and follow the prompts.  For any non-urgent questions, you may also contact your provider using MyChart. We now offer e-Visits for anyone 18 and older to request care online for non-urgent symptoms. For details visit [email or web address].   Also download the MyChart app! Go to the app store, search "MyChart", open the app, select [facility], and log in with your MyChart username and password.  Masks are optional in the cancer centers. If you would like for your care team to wear a mask while they are taking care of you, please let them know. You may have one support person who is at least 65 years old accompany you for your appointments.  

## 2022-07-10 NOTE — Telephone Encounter (Signed)
  Patient would like to switch from Dr Bettina Gavia to Dr Dellia Cloud so he can be seen in [place] where he lives. Please advise

## 2022-07-14 ENCOUNTER — Telehealth: Payer: Self-pay

## 2022-07-14 ENCOUNTER — Inpatient Hospital Stay: Payer: PPO | Admitting: Physician Assistant

## 2022-07-14 DIAGNOSIS — Z86718 Personal history of other venous thrombosis and embolism: Secondary | ICD-10-CM | POA: Diagnosis not present

## 2022-07-14 DIAGNOSIS — I82431 Acute embolism and thrombosis of right popliteal vein: Secondary | ICD-10-CM

## 2022-07-14 DIAGNOSIS — I2694 Multiple subsegmental pulmonary emboli without acute cor pulmonale: Secondary | ICD-10-CM

## 2022-07-14 DIAGNOSIS — D6851 Activated protein C resistance: Secondary | ICD-10-CM

## 2022-07-14 LAB — PROTIME-INR
INR: 1.3 — ABNORMAL HIGH (ref 0.8–1.2)
Prothrombin Time: 16 seconds — ABNORMAL HIGH (ref 11.4–15.2)

## 2022-07-14 NOTE — Progress Notes (Signed)
BONNIE: Please call patient to discuss the following... - INR is subtherapeutic at 1.3 (goal 2.0-3.0) - Please verify that he is taking warfarin 2.5 mg nightly as well as Lovenox injections twice a day - Please instruct him to INCREASE his warfarin to 5 mg nightly (2 tablets) - Please assist with ordering and scheduling repeat INR on Thursday

## 2022-07-14 NOTE — Telephone Encounter (Signed)
Spoke with the patient for lab results and new directions for Warfarin.  Scheduling will call for time on Thursday morning.   Patient stated he is feeling light headed and dizzy prn.  He feels this way after standing from a reclining position, too.  Patient stated he is drinking water and his urine is yellow.  Denied headaches, blurred vision, facial drooping, numbness in arms, ringing in ears, or trouble walking. Instructed the patient to call tomorrow to keep updated and to go to the ER for any worsening or changes of symptoms.  Understanding verbalized.

## 2022-07-14 NOTE — Telephone Encounter (Signed)
-----   Message from Harriett Rush, [place] sent at 07/14/2022  1:03 PM EST ----- Denell Cothern: Please call patient to discuss the following... - INR is subtherapeutic at 1.3 (goal 2.0-3.0) - Please verify that he is taking warfarin 2.5 mg nightly as well as Lovenox injections twice a day - Please instruct him to INCREASE his warfarin to 5 mg nightly (2 tablets) - Please assist with ordering and scheduling repeat INR on Thursday

## 2022-07-14 NOTE — Progress Notes (Signed)
Message left on VM to return call.

## 2022-07-17 ENCOUNTER — Telehealth: Payer: Self-pay

## 2022-07-17 ENCOUNTER — Other Ambulatory Visit: Payer: Self-pay | Admitting: Physician Assistant

## 2022-07-17 ENCOUNTER — Inpatient Hospital Stay: Payer: PPO

## 2022-07-17 DIAGNOSIS — D6851 Activated protein C resistance: Secondary | ICD-10-CM

## 2022-07-17 DIAGNOSIS — I82431 Acute embolism and thrombosis of right popliteal vein: Secondary | ICD-10-CM

## 2022-07-17 DIAGNOSIS — I2694 Multiple subsegmental pulmonary emboli without acute cor pulmonale: Secondary | ICD-10-CM

## 2022-07-17 DIAGNOSIS — Z86718 Personal history of other venous thrombosis and embolism: Secondary | ICD-10-CM | POA: Diagnosis not present

## 2022-07-17 LAB — PROTIME-INR
INR: 3.2 — ABNORMAL HIGH (ref 0.8–1.2)
Prothrombin Time: 32.3 seconds — ABNORMAL HIGH (ref 11.4–15.2)

## 2022-07-17 MED ORDER — WARFARIN SODIUM 4 MG PO TABS: 4.0000 mg | ORAL_TABLET | Freq: Every day | ORAL | 0 refills | Status: DC

## 2022-07-17 NOTE — Progress Notes (Signed)
INR today 3.2 (current warfarin dose 5 mg daily). INR last week was 1.3 (warfarin 2.5 mg). We will call patient with instructions to switch to warfarin 4 mg daily (new prescription sent via pharmacy for 7-day supply). Patient can STOP taking his Lovenox at this time. We will check repeat INR on Monday, 07/21/2022.

## 2022-07-17 NOTE — Telephone Encounter (Signed)
-----   Message from Harriett Rush, [place] sent at 07/17/2022 10:58 AM EST ----- Please call patient to let him know that his INR is 3.2 (goal is INR 2-3).  This means that his blood is now thin enough for treatment of his DVT, but now we need to adjust his warfarin dose so that his blood does not get TOO thin. ##Patient can STOP Lovenox injections. ##New prescription sent to pharmacy for DECREASED dose of warfarin 4 mg nightly. ##Patient will need repeat INR on Monday, 07/21/2022.

## 2022-07-17 NOTE — Telephone Encounter (Signed)
Spoke with the patient for PT/INR results. directions for Warfarin and stopping Lovenox with understanding verbalized.  All questions asked and answered.

## 2022-07-21 ENCOUNTER — Other Ambulatory Visit: Payer: Self-pay | Admitting: Physician Assistant

## 2022-07-21 ENCOUNTER — Telehealth: Payer: Self-pay

## 2022-07-21 ENCOUNTER — Inpatient Hospital Stay (HOSPITAL_BASED_OUTPATIENT_CLINIC_OR_DEPARTMENT_OTHER): Payer: PPO | Admitting: Physician Assistant

## 2022-07-21 DIAGNOSIS — D6851 Activated protein C resistance: Secondary | ICD-10-CM

## 2022-07-21 DIAGNOSIS — Z86718 Personal history of other venous thrombosis and embolism: Secondary | ICD-10-CM | POA: Diagnosis not present

## 2022-07-21 DIAGNOSIS — I2694 Multiple subsegmental pulmonary emboli without acute cor pulmonale: Secondary | ICD-10-CM

## 2022-07-21 DIAGNOSIS — I82431 Acute embolism and thrombosis of right popliteal vein: Secondary | ICD-10-CM

## 2022-07-21 LAB — PROTIME-INR
INR: 4 — ABNORMAL HIGH (ref 0.8–1.2)
Prothrombin Time: 38.7 seconds — ABNORMAL HIGH (ref 11.4–15.2)

## 2022-07-21 MED ORDER — WARFARIN SODIUM 2.5 MG PO TABS: 2.5000 mg | ORAL_TABLET | Freq: Every day | ORAL | 0 refills | Status: DC

## 2022-07-21 MED ORDER — WARFARIN SODIUM 1 MG PO TABS: 1.0000 mg | ORAL_TABLET | Freq: Every day | ORAL | 0 refills | Status: DC

## 2022-07-21 NOTE — Telephone Encounter (Signed)
-----   Message from Harriett Rush, [place] sent at 07/21/2022  2:13 PM EST ----- Michaila Kenney: In addition to previous instructions, please schedule him for repeat INR on Wednesday, 07/23/2022. Since he sees cardiology on 07/24/2022, if they verify that they will take over dosing of his warfarin, he will NOT need to have his warfarin checked by [place] anymore AFTER we check it this Wednesday.  However, if his cardiologist is not going to take over monitoring of his warfarin, we will still need to keep an eye on it going forward.

## 2022-07-21 NOTE — Telephone Encounter (Signed)
Spoke with the patient regarding new Warfarin directions with understanding verbalized.  Patient will have a recheck on 07/23/2022, and then see Cardiology 07/24/2022 and if they will not monitor he will call us.  Patient verbalized understanding.

## 2022-07-21 NOTE — Telephone Encounter (Signed)
Patient aware of appointments and directions.

## 2022-07-21 NOTE — Progress Notes (Addendum)
**  Current dose warfarin 4 mg daily.  (Lovenox discontinued as of 07/17/2022) **INR today 4.0 (INR 3.2 on 07/17/2022, dose was reduced from warfarin 5 mg at that time)  BONNIE: Please call patient to discuss the following. >> Verify that he has been taking his 4 mg dose of warfarin (NOT his 5 mg dose). >> Let him know that his INR is still too high, despite his decreased dose of warfarin.   >> Reeducate on bleeding precautions and when to seek immediate medical attention. >> Have him HOLD his next dose of warfarin. >> After holding warfarin x 1 dose, he can restart warfarin the following day at new lower dose of 3.5 mg daily. (He will need to take a combination dosage consisting of one 1 mg tablet plus one 2.5 mg tablet daily for a total dose of 3.5 mg daily.  I believe he may still have some 2.5 mg tablets left over from the first dose I prescribed for him - if he still has these, he can use them and would only need to pick up the 1 mg prescription from the pharmacy, although I did send both doses if needed.) >> Please schedule him for repeat INR on Wednesday, 07/23/2022.  (Since he sees cardiology on 07/24/2022, if they verify that they will take over dosing of his warfarin, he will NOT need to have his warfarin checked by [place] anymore AFTER we check it on Wednesday.  However, if his cardiologist is not going to take over monitoring of his warfarin, we will still need to keep an eye on it going forward.)

## 2022-07-21 NOTE — Telephone Encounter (Signed)
-----   Message from Harriett Rush, [place] sent at 07/21/2022 11:23 AM EST ----- ##Current dose warfarin 4 mg daily.  (Lovenox discontinued as of 07/17/2022) ##INR today 4.0 (INR 3.2 on 07/17/2022, dose was reduced from warfarin 5 mg at that time)  Uriyah Raska: Please call patient to discuss the following. >> Verify that he has been taking his 4 mg dose of warfarin (NOT his 5 mg dose). >> Let him know that his INR is still too high, despite his decreased dose of warfarin.   >> Reeducate on bleeding precautions and when to seek immediate medical attention. >> Have him HOLD his next dose of warfarin. >> After holding warfarin x 1 dose, he can restart warfarin the following day at new lower dose of 3.5 mg daily. (He will need to take a combination dosage consisting of one 1 mg tablet plus one 2.5 mg tablet daily for a total dose of 3.5 mg daily.  I believe he may still have some 2.5 mg tablets left over from the first dose I prescribed for him - if he still has these, he can use them and would only need to pick up the 1 mg prescription from the pharmacy, although I did send both doses if needed.)

## 2022-07-22 ENCOUNTER — Other Ambulatory Visit: Payer: Self-pay

## 2022-07-22 DIAGNOSIS — I82431 Acute embolism and thrombosis of right popliteal vein: Secondary | ICD-10-CM

## 2022-07-23 ENCOUNTER — Telehealth: Payer: Self-pay

## 2022-07-23 ENCOUNTER — Inpatient Hospital Stay: Payer: PPO

## 2022-07-23 DIAGNOSIS — I82431 Acute embolism and thrombosis of right popliteal vein: Secondary | ICD-10-CM

## 2022-07-23 DIAGNOSIS — Z86718 Personal history of other venous thrombosis and embolism: Secondary | ICD-10-CM | POA: Diagnosis not present

## 2022-07-23 LAB — PROTIME-INR
INR: 4.6 (ref 0.8–1.2)
Prothrombin Time: 43.4 seconds — ABNORMAL HIGH (ref 11.4–15.2)

## 2022-07-23 NOTE — Telephone Encounter (Signed)
-----   Message from Harriett Rush, [place] sent at 07/23/2022  9:52 AM EST ----- Supratherapeutic INR at 4.6. Call patient and instruct him to HOLD warfarin dose today AND tomorrow. If he goes to the heart doctor today and they take over his warfarin management, I will leave the rest of them.  If they do NOT take over his warfarin management, he needs to let us know so that we can schedule him for further INR checks. Provide education on bleeding precautions.

## 2022-07-23 NOTE — Telephone Encounter (Signed)
Spoke with the patient and directions for Warfarin reviewed.  Instructed the patient to call and let us know what the cardiologist told him with his office visit on 07/24/2022.  Reviewed bleeding precautions and when to report to the ER.

## 2022-07-23 NOTE — Progress Notes (Unsigned)
CRITICAL VALUE ALERT Critical value received:  INR 4.63 Date of notification:  07-23-22 Time of notification: 0950 Critical value read back:  Yes.   Nurse who received alert:  C. Keyera Hattabaugh RN MD notified time and response:  [phone], Tarri Abernethy PA-C.  Margaretmary Lombard RN will call patient.

## 2022-07-23 NOTE — Progress Notes (Signed)
Supratherapeutic INR at 4.6. Call patient and instruct him to HOLD warfarin dose today AND tomorrow. If he goes to the heart doctor today and they take over his warfarin management, I will leave the rest of them.  If they do NOT take over his warfarin management, he needs to let us know so that we can schedule him for further INR checks. Provide education on bleeding precautions.

## 2022-07-24 ENCOUNTER — Encounter: Payer: Self-pay | Admitting: Internal Medicine

## 2022-07-24 ENCOUNTER — Ambulatory Visit: Payer: PPO | Attending: Internal Medicine | Admitting: Internal Medicine

## 2022-07-24 VITALS — BP 148/78 | HR 57 | Ht 74.0 in | Wt 243.0 lb

## 2022-07-24 DIAGNOSIS — I2510 Atherosclerotic heart disease of native coronary artery without angina pectoris: Secondary | ICD-10-CM

## 2022-07-24 DIAGNOSIS — I10 Essential (primary) hypertension: Secondary | ICD-10-CM

## 2022-07-24 DIAGNOSIS — I25119 Atherosclerotic heart disease of native coronary artery with unspecified angina pectoris: Secondary | ICD-10-CM | POA: Diagnosis not present

## 2022-07-24 DIAGNOSIS — Z5181 Encounter for therapeutic drug level monitoring: Secondary | ICD-10-CM | POA: Diagnosis not present

## 2022-07-24 DIAGNOSIS — Z7901 Long term (current) use of anticoagulants: Secondary | ICD-10-CM

## 2022-07-24 NOTE — Progress Notes (Signed)
Cardiology Office Note  Date: 07/24/2022   ID: ARMONDO CECH, DOB 1956/02/12, MRN [id number]  PCP:  [name], MD  Cardiologist:  None Electrophysiologist:  None   Reason for Office Visit: CAD follow-up   History of Present Illness: [name] is a 66 y.o. male known to have mild to moderate CAD (non flow limiting LAD disease in 2022) with normal LVEF, heterozygous factor V Leiden mutation with recurrent PE and DVT (failed Eliquis and currently trying to optimize Coumadin with goal INR between 2 and 3), OSA presented to cardiology clinic for follow-up visit.  Patient had chest pain for which he underwent CTA cardiac in 07/2021 which showed Coronary calcium score of 2641 and CAD RADS 4 severe stenosis (severe stenosis in proximal LAD). He subsequently underwent LHC which showed nonflow limiting proximal LAD disease and pretty much mild to moderate CAD. Thereafter, he had multiple episodes of PE and DVT recurrent due to factor V Leiden mutation. He had thromboembolism on Eliquis and hence it was considered Eliquis failure. He was subsequently switched to Coumadin but the Coumadin clinic had difficulty managing his INR.  His INR had been supratherapeutic 4.6, he was instructed to hold his Coumadin yesterday and today.  They were wondering if cardiology wants to take over the Coumadin dosing management. Otherwise, he denied any symptoms of angina, DOE, dizziness/lightness, syncope, LE swelling.  Past Medical History:  Diagnosis Date   Bilateral shoulder bursitis    Chronic back pain    COPD (chronic obstructive pulmonary disease) with emphysema ([name])    DDD (degenerative disc disease), lumbar    hx epideral injection  L5--S1    Diverticulitis    DVT (deep venous thrombosis) ([place]) 11/19/2018   Dysphasia    intermittant w/ food    Dyspnea    Dyspnea on exertion    Fatty liver    GERD (gastroesophageal reflux disease)    Gout    L great toe   Heterozygous factor V Leiden  mutation ([place])    Hiatal hernia    Hip problem 2020   left, seeing orthopedics   History of acute pancreatitis    alcoholic pancreatitis [id number] and 01-30-2012. 2021   History of colon polyps    10-17-2005  hyperplastic polyp's   History of esophageal stricture    s/p  dilation 02-02-2017   History of peptic ulcer 1990s   History of pneumococcal pneumonia 2006   bilateral pneumonia w/ left lower lobe abscess treated w/ chest tube with suction for drainage   History of pneumothorax    1984--  left spontaneous pneumothorax ,  treated w/ chest tube   Hypertension    Malignant neoplasm of prostate [facility]) urologist-  dr dahlstedt/  oncologist -- dr Tammi Klippel   dx 12-02-2016-- Stage T2b, Gleason 3+4,  PSA 5.99,  vol 25cc   Neuropathy    Numbness of left foot    outer left ankle numb-- per pt has appt. w/ neurologist   OSA (obstructive sleep apnea)    per pt has not used cpap over year ago from 04-16-2017--- per study 01-17-2010  severe osa   Pneumonia    Solid nodule of lung greater than 8 mm in diameter 05/11/2018   03/2018: RUL Nodule, 95m   Wears glasses     Past Surgical History:  Procedure Laterality Date   BIOPSY  02/02/2017   Procedure: BIOPSY;  Surgeon: RDaneil Dolin MD;  [facility];  Service: Endoscopy;;  duodenal bx's, esophgeal  bx's   BIOPSY  07/30/2017   Procedure: BIOPSY;  Surgeon: Daneil Dolin, MD;  [facility];  Service: Endoscopy;;  esophagus   BIOPSY  02/15/2021   Procedure: BIOPSY;  Surgeon: Daneil Dolin, MD;  [facility];  Service: Endoscopy;;   CARDIOVASCULAR STRESS TEST  09/12/2009   normal nuclear perfusion study w/ no ischemia /  normal LV function and wall motion , ef 57%   COLONOSCOPY  2007   Dr. Gala Romney: hyperplastic polyps, internal hemorrhoids    COLONOSCOPY WITH PROPOFOL N/A 05/03/2018   Dr. Gala Romney: diverticulosis in entire colon, multiple polyps in sigmoid, at splenic flexure, and ascending colon, not all  polyps removed. Tubular adenomas and inflammatory polyps. Needs 3 year surveilance   COLONOSCOPY WITH PROPOFOL N/A 02/15/2021   Many polyps without adenomatous changes but all inflammatory in nature with mildly active colitis and chronic inflammatory mucosa.   EPIDURAL BLOCK INJECTION  2020   ESOPHAGOGASTRODUODENOSCOPY (EGD) WITH PROPOFOL N/A 02/02/2017   Dr. Gala Romney: esophageal stenosis s/p dilation and biopsy, medium sized hiatal hernia, normal duodenum   ESOPHAGOGASTRODUODENOSCOPY (EGD) WITH PROPOFOL N/A 07/30/2017   Dr. Gala Romney: esophageal stenosis s/p dilation and biopsy, medium-sized hiatal hernia, normal duodenum   HEMORRHOID SURGERY  01-21-2008    [facility]ine Na   HIP ARTHROPLASTY Bilateral    INTRAVASCULAR PRESSURE WIRE/FFR STUDY N/A 08/01/2021   Procedure: INTRAVASCULAR PRESSURE WIRE/FFR STUDY;  Surgeon: Burnell Blanks, MD;  Location: [facility];  Service: Cardiovascular;  Laterality: N/A;   IR RADIOLOGIST EVAL & MGMT  08/29/2020   LEFT HEART CATH AND CORONARY ANGIOGRAPHY N/A 08/01/2021   Procedure: LEFT HEART CATH AND CORONARY ANGIOGRAPHY;  Surgeon: Burnell Blanks, MD;  Location: [facility];  Service: Cardiovascular;  Laterality: N/A;   MALONEY DILATION N/A 02/02/2017   Procedure: Venia Minks DILATION;  Surgeon: Daneil Dolin, MD;  [facility];  Service: Endoscopy;  Laterality: N/A;   MALONEY DILATION N/A 07/30/2017   Procedure: Venia Minks DILATION;  Surgeon: Daneil Dolin, MD;  [facility];  Service: Endoscopy;  Laterality: N/A;   POLYPECTOMY  05/03/2018   Procedure: POLYPECTOMY;  Surgeon: Daneil Dolin, MD;  [facility];  Service: Endoscopy;;  ascending colon polyp, sigmoid colon polyps (multiple)   POLYPECTOMY  02/15/2021   Procedure: POLYPECTOMY;  Surgeon: Daneil Dolin, MD;  [facility];  Service: Endoscopy;;   RADIOACTIVE SEED IMPLANT N/A 04/23/2017   Procedure: RADIOACTIVE SEED IMPLANT/BRACHYTHERAPY  IMPLANT;  Surgeon: Franchot Gallo, MD;  [facility];  Service: Urology;  Laterality: N/A;   SHOULDER ARTHROSCOPY  08/05/2003   SPACE OAR INSTILLATION N/A 04/23/2017   Procedure: SPACE OAR INSTILLATION;  Surgeon: Franchot Gallo, MD;  Location: [facility];  Service: Urology;  Laterality: N/A;   TRANSTHORACIC ECHOCARDIOGRAM  03/28/2009   mild LVH, ef 55-60%/ mild aorta calcification   VASECTOMY  08/05/1983   VIDEO ASSISTED THORACOSCOPY (VATS)/DECORTICATION Left     Current Outpatient Medications  Medication Sig Dispense Refill   acetaminophen (TYLENOL) 500 MG tablet Take 2 tablets (1,000 mg total) by mouth every 6 (six) hours as needed for mild pain. 30 tablet 0   albuterol (PROVENTIL) (2.5 MG/3ML) 0.083% nebulizer solution Take 2.5 mg by nebulization every 6 (six) hours as needed for wheezing or shortness of breath.     albuterol (VENTOLIN HFA) 108 (90 Base) MCG/ACT inhaler Inhale 2 puffs into the lungs every 6 (six) hours as needed  for wheezing or shortness of breath.     allopurinol (ZYLOPRIM) 300 MG tablet Take 300 mg by mouth daily.     doxycycline (VIBRAMYCIN) 50 MG capsule Take 50 mg by mouth daily.     NON FORMULARY Pt uses a cpap nightly     pantoprazole (PROTONIX) 40 MG tablet Take 1 tablet (40 mg total) by mouth daily. 30 tablet 11   rosuvastatin (CRESTOR) 20 MG tablet TAKE 1 TABLET(20 MG) BY MOUTH DAILY 90 tablet 3   TRELEGY ELLIPTA 100-62.5-25 MCG/ACT AEPB INHALE 1 PUFF INTO THE LUNGS DAILY 60 each 5   No current facility-administered medications for this visit.   Allergies:  Patient has no known allergies.   Social History: The patient  reports that he quit smoking about 6 years ago. His smoking use included cigarettes and e-cigarettes. He has a 30.00 pack-year smoking history. He has been exposed to tobacco smoke. He has never used smokeless tobacco. He reports current alcohol use of about 5.0 standard drinks of alcohol per week.  He reports that he does not use drugs.   Family History: The patient's family history includes Diabetes in his mother; Heart disease in his brother and father.   ROS:  Please see the history of present illness. Otherwise, complete review of systems is positive for none.  All other systems are reviewed and negative.   Physical Exam: VS:  BP (!) 148/78   Pulse (!) 57   Ht '6\' 2"'$  (1.88 m)   Wt 243 lb (110.2 kg)   SpO2 98%   BMI 31.20 kg/m , BMI Body mass index is 31.2 kg/m.  Wt Readings from Last 3 Encounters:  07/24/22 243 lb (110.2 kg)  07/10/22 238 lb 8.6 oz (108.2 kg)  04/29/22 226 lb 9.6 oz (102.8 kg)    General: Patient appears comfortable at rest. HEENT: Conjunctiva and lids normal, oropharynx clear with moist mucosa. Neck: Supple, no elevated JVP or carotid bruits, no thyromegaly. Lungs: Clear to auscultation, nonlabored breathing at rest. Cardiac: Regular rate and rhythm, no S3 or significant systolic murmur, no pericardial rub. Abdomen: Soft, nontender, no hepatomegaly, bowel sounds present, no guarding or rebound. Extremities: No pitting edema, distal pulses 2+. Skin: Warm and dry. Musculoskeletal: No kyphosis. Neuropsychiatric: Alert and oriented x3, affect grossly appropriate.  ECG:  An ECG dated 07/24/2022 was personally reviewed today and demonstrated:  Sinus bradycardia with first-degree AV block  Recent Labwork: 09/24/2021: Magnesium 1.8 01/17/2022: ALT 49; AST 68 02/14/2022: BUN 12; Creatinine, Ser 0.71; Hemoglobin 14.0; Platelets 160; Potassium 4.2; Sodium 144     Component Value Date/Time   CHOL 180 11/13/2011 0857   TRIG 95 11/13/2011 0857   HDL 62 11/13/2011 0857   CHOLHDL 2.9 11/13/2011 0857   VLDL 19 11/13/2011 0857   LDLCALC 99 11/13/2011 0857    Other Studies Reviewed Today: Echo from 08/2021 LVEF normal Grade 1 diastolic function RV systolic function is normal  LHC in 07/2021 Moderate, calcified proximal LAD stenosis. RFR 0.97-0.96 suggesting  this lesion is not flow limiting. Mild mid LAD stenosis.  Large caliber Circumflex artery with mild mid stenosis Large dominant RCA with mild, calcified proximal and mid vessel stenosis.  Normal LVEDP Normal LV systolic function, NIDP=82%  Assessment and Plan:  Patient is a 66 year old M known to have mild to moderate CAD (non flow limiting LAD disease in 2022) with normal LVEF, heterozygous factor V Leiden mutation with recurrent PE and DVT (failed Eliquis and currently trying to optimize Coumadin with goal  INR between 2 and 3), OSA presented to cardiology clinic for follow-up visit.  # Mild to moderate CAD (non flow limiting LAD disease in 2022) with normal LVEF, currently angina free -Not on aspirin due to Coumadin use -Continue rosuvastatin 20 mg nightly -No indication to be on beta-blocker or ACE inhibitor unless for other indication.  Will stop beta-blocker.  # Factor V Leiden mutation with recurrent PE/DVT -Patient was previously on Eliquis which was considered as Eliquis failure and currently he is on Coumadin. Coumadin was held yesterday and today due to supratherapeutic INR.  We will take over the Coumadin management. Will check his INR tomorrow morning and depending on his INR levels, will instruct him to start taking Coumadin dose (most likely Coumadin 3 mg and 2 mg alternately however this might change depending on his INR levels tomorrow morning). Referral to Coumadin clinic in [place], with cardiology. Goal INR is between 2 and 3.  # HTN, rule out whitecoat hypertension -Patient said he checks his blood pressure sometimes at home which usually ranges between 120 to 130 mmHg SBP.  He is requesting to stop his metoprolol.  Stop metoprolol and check his home BPs.  If more than 130 mmHg SBP, he is instructed to call the clinic and needs to be started on antihypertensive medication, preferably amlodipine 2.5 mg once daily.  I have spent a total of 33 minutes with patient reviewing chart ,  telemetry, EKGs, labs and examining patient as well as establishing an assessment and plan that was discussed with the patient.  > 50% of time was spent in direct patient care.    Medication Adjustments/Labs and Tests Ordered: Current medicines are reviewed at length with the patient today.  Concerns regarding medicines are outlined above.   Tests Ordered: Orders Placed This Encounter  Procedures   INR/PT   Ambulatory referral to Anticoagulation Monitoring   EKG 12-Lead    Medication Changes: No orders of the defined types were placed in this encounter.   Disposition:  Follow up  10 months  Signed, Vermon Grays Fidel Levy, MD, 07/24/2022 8:43 AM    [facility] [address] [place], [place] [address]

## 2022-07-24 NOTE — Patient Instructions (Signed)
Medication Instructions:  Your physician has recommended you make the following change in your medication:  -Stop Metoprolol Succinate   Labwork: 07/25/2022: -PT/INR  Testing/Procedures: None  Follow-Up: Follow up with Dr. Dellia Cloud in 10 months.  Any Other Special Instructions Will Be Listed Below (If Applicable).   Your physician has requested that you regularly monitor and record your blood pressure readings at home. Please use the same machine at the same time of day to check your readings and record them to bring to your follow-up visit.  Please track your bp for 2 weeks and update our office with readings.   You have been referred to Edrick Oh, RN at the Coumadin clinic in [place].   If you need a refill on your cardiac medications before your next appointment, please call your pharmacy.

## 2022-07-25 ENCOUNTER — Telehealth: Payer: Self-pay

## 2022-07-25 ENCOUNTER — Other Ambulatory Visit (HOSPITAL_COMMUNITY)
Admission: RE | Admit: 2022-07-25 | Discharge: 2022-07-25 | Disposition: A | Discharge status: Home / Self Care | Payer: PPO | Source: Ambulatory Visit | Attending: Internal Medicine | Admitting: Internal Medicine

## 2022-07-25 DIAGNOSIS — Z7901 Long term (current) use of anticoagulants: Secondary | ICD-10-CM | POA: Diagnosis present

## 2022-07-25 DIAGNOSIS — Z5181 Encounter for therapeutic drug level monitoring: Secondary | ICD-10-CM | POA: Insufficient documentation

## 2022-07-25 LAB — PROTIME-INR
INR: 3.9 — ABNORMAL HIGH (ref 0.8–1.2)
Prothrombin Time: 37.6 seconds — ABNORMAL HIGH (ref 11.4–15.2)

## 2022-07-25 MED ORDER — WARFARIN SODIUM 2 MG PO TABS: 2.0000 mg | ORAL_TABLET | Freq: Every day | ORAL | 3 refills | Status: DC

## 2022-07-25 NOTE — Telephone Encounter (Signed)
-----   Message from Chalmers Guest, MD sent at 07/25/2022 12:55 PM EST ----- INR still supra-therapeutic. Continue to hold warfarin today and tomorrow. Can restart Coumadin '2mg'$  once daily from 07/27/22.

## 2022-07-25 NOTE — Telephone Encounter (Signed)
Message sent to patient via MyChart as patient is an active user.

## 2022-07-28 ENCOUNTER — Emergency Department (HOSPITAL_COMMUNITY): Payer: PPO

## 2022-07-28 ENCOUNTER — Other Ambulatory Visit: Payer: Self-pay

## 2022-07-28 ENCOUNTER — Encounter (HOSPITAL_COMMUNITY): Payer: Self-pay

## 2022-07-28 ENCOUNTER — Observation Stay (HOSPITAL_COMMUNITY)
Admission: EM | Admit: 2022-07-28 | Discharge: 2022-07-29 | Disposition: A | Discharge status: Home / Self Care | Payer: PPO | Attending: Family Medicine | Admitting: Family Medicine

## 2022-07-28 DIAGNOSIS — Z96643 Presence of artificial hip joint, bilateral: Secondary | ICD-10-CM | POA: Insufficient documentation

## 2022-07-28 DIAGNOSIS — Z86718 Personal history of other venous thrombosis and embolism: Secondary | ICD-10-CM | POA: Diagnosis not present

## 2022-07-28 DIAGNOSIS — Z8546 Personal history of malignant neoplasm of prostate: Secondary | ICD-10-CM | POA: Diagnosis not present

## 2022-07-28 DIAGNOSIS — Z1152 Encounter for screening for COVID-19: Secondary | ICD-10-CM | POA: Diagnosis not present

## 2022-07-28 DIAGNOSIS — E876 Hypokalemia: Secondary | ICD-10-CM | POA: Insufficient documentation

## 2022-07-28 DIAGNOSIS — R0789 Other chest pain: Secondary | ICD-10-CM | POA: Diagnosis not present

## 2022-07-28 DIAGNOSIS — E782 Mixed hyperlipidemia: Secondary | ICD-10-CM | POA: Diagnosis present

## 2022-07-28 DIAGNOSIS — R791 Abnormal coagulation profile: Secondary | ICD-10-CM | POA: Insufficient documentation

## 2022-07-28 DIAGNOSIS — Z79899 Other long term (current) drug therapy: Secondary | ICD-10-CM | POA: Insufficient documentation

## 2022-07-28 DIAGNOSIS — I2510 Atherosclerotic heart disease of native coronary artery without angina pectoris: Secondary | ICD-10-CM | POA: Insufficient documentation

## 2022-07-28 DIAGNOSIS — Z86711 Personal history of pulmonary embolism: Secondary | ICD-10-CM | POA: Insufficient documentation

## 2022-07-28 DIAGNOSIS — K219 Gastro-esophageal reflux disease without esophagitis: Secondary | ICD-10-CM | POA: Diagnosis present

## 2022-07-28 DIAGNOSIS — I10 Essential (primary) hypertension: Secondary | ICD-10-CM | POA: Insufficient documentation

## 2022-07-28 DIAGNOSIS — R0602 Shortness of breath: Secondary | ICD-10-CM | POA: Insufficient documentation

## 2022-07-28 DIAGNOSIS — J449 Chronic obstructive pulmonary disease, unspecified: Secondary | ICD-10-CM | POA: Diagnosis not present

## 2022-07-28 DIAGNOSIS — D6851 Activated protein C resistance: Secondary | ICD-10-CM | POA: Diagnosis present

## 2022-07-28 DIAGNOSIS — Z7901 Long term (current) use of anticoagulants: Secondary | ICD-10-CM | POA: Diagnosis not present

## 2022-07-28 DIAGNOSIS — R079 Chest pain, unspecified: Secondary | ICD-10-CM | POA: Diagnosis present

## 2022-07-28 DIAGNOSIS — Z87891 Personal history of nicotine dependence: Secondary | ICD-10-CM | POA: Insufficient documentation

## 2022-07-28 DIAGNOSIS — J9601 Acute respiratory failure with hypoxia: Secondary | ICD-10-CM | POA: Insufficient documentation

## 2022-07-28 LAB — BASIC METABOLIC PANEL
Anion gap: 11 (ref 5–15)
BUN: 11 mg/dL (ref 8–23)
CO2: 23 mmol/L (ref 22–32)
Calcium: 9.3 mg/dL (ref 8.9–10.3)
Chloride: 105 mmol/L (ref 98–111)
Creatinine, Ser: 0.85 mg/dL (ref 0.61–1.24)
GFR, Estimated: >60 mL/min (ref >60)
Glucose, Bld: 125 mg/dL — ABNORMAL HIGH (ref 70–99)
Potassium: 3.4 mmol/L — ABNORMAL LOW (ref 3.5–5.1)
Sodium: 139 mmol/L (ref 135–145)

## 2022-07-28 LAB — PROTIME-INR
INR: 1.7 — ABNORMAL HIGH (ref 0.8–1.2)
Prothrombin Time: 19.5 seconds — ABNORMAL HIGH (ref 11.4–15.2)

## 2022-07-28 LAB — CBC
HCT: 43 % (ref 39.0–52.0)
Hemoglobin: 14.6 g/dL (ref 13.0–17.0)
MCH: 33.2 pg (ref 26.0–34.0)
MCHC: 34 g/dL (ref 30.0–36.0)
MCV: 97.7 fL (ref 80.0–100.0)
Platelets: 141 10*3/uL — ABNORMAL LOW (ref 150–400)
RBC: 4.4 MIL/uL (ref 4.22–5.81)
RDW: 13.5 % (ref 11.5–15.5)
WBC: 9.4 10*3/uL (ref 4.0–10.5)
nRBC: 0 % (ref 0.0–0.2)

## 2022-07-28 LAB — TROPONIN I (HIGH SENSITIVITY): Troponin I (High Sensitivity): 12 ng/L (ref <18)

## 2022-07-28 NOTE — ED Triage Notes (Signed)
Pt presents with gradual onset of mid sternal CP and ShOB that started while sitting down. Pt describes the pain as a constant pressure that is worse with inspiration. Pt with recent hx of DVT while on Elloquis. Pt was changed to warfarin.

## 2022-07-28 NOTE — ED Notes (Signed)
Patient transported to X-ray 

## 2022-07-29 ENCOUNTER — Encounter (HOSPITAL_COMMUNITY): Payer: Self-pay | Admitting: Family Medicine

## 2022-07-29 ENCOUNTER — Observation Stay (HOSPITAL_BASED_OUTPATIENT_CLINIC_OR_DEPARTMENT_OTHER): Payer: PPO

## 2022-07-29 ENCOUNTER — Emergency Department (HOSPITAL_COMMUNITY): Payer: PPO

## 2022-07-29 ENCOUNTER — Other Ambulatory Visit (HOSPITAL_COMMUNITY): Payer: Self-pay | Admitting: *Deleted

## 2022-07-29 DIAGNOSIS — R079 Chest pain, unspecified: Secondary | ICD-10-CM | POA: Diagnosis present

## 2022-07-29 DIAGNOSIS — R0789 Other chest pain: Secondary | ICD-10-CM

## 2022-07-29 DIAGNOSIS — E785 Hyperlipidemia, unspecified: Secondary | ICD-10-CM

## 2022-07-29 DIAGNOSIS — I10 Essential (primary) hypertension: Secondary | ICD-10-CM | POA: Diagnosis not present

## 2022-07-29 DIAGNOSIS — E782 Mixed hyperlipidemia: Secondary | ICD-10-CM

## 2022-07-29 DIAGNOSIS — R791 Abnormal coagulation profile: Secondary | ICD-10-CM

## 2022-07-29 DIAGNOSIS — J9601 Acute respiratory failure with hypoxia: Secondary | ICD-10-CM

## 2022-07-29 DIAGNOSIS — E876 Hypokalemia: Secondary | ICD-10-CM

## 2022-07-29 DIAGNOSIS — I2510 Atherosclerotic heart disease of native coronary artery without angina pectoris: Secondary | ICD-10-CM | POA: Diagnosis not present

## 2022-07-29 DIAGNOSIS — K219 Gastro-esophageal reflux disease without esophagitis: Secondary | ICD-10-CM

## 2022-07-29 DIAGNOSIS — J449 Chronic obstructive pulmonary disease, unspecified: Secondary | ICD-10-CM

## 2022-07-29 DIAGNOSIS — D6851 Activated protein C resistance: Secondary | ICD-10-CM

## 2022-07-29 LAB — ECHOCARDIOGRAM COMPLETE
AR max vel: 3.52 cm2
AV Area VTI: 3.77 cm2
AV Area mean vel: 3.43 cm2
AV Mean grad: 4 mmHg
AV Peak grad: 7.5 mmHg
Ao pk vel: 1.37 m/s
Area-P 1/2: 2.99 cm2
Height: 74 in
S' Lateral: 3.63 cm
Weight: 3840 oz

## 2022-07-29 LAB — PROTIME-INR
INR: 1.6 — ABNORMAL HIGH (ref 0.8–1.2)
Prothrombin Time: 18.7 seconds — ABNORMAL HIGH (ref 11.4–15.2)

## 2022-07-29 LAB — NM MYOCAR MULTI W/SPECT W/WALL MOTION / EF
LV dias vol: 96 mL (ref 62–150)
LV sys vol: 38 mL
Nuc Stress EF: 61 %
Peak HR: 92 {beats}/min
RATE: 0.4
Rest HR: 69 {beats}/min
Rest Nuclear Isotope Dose: 10.8 mCi
SDS: 1
SRS: 0
SSS: 1
ST Depression (mm): 0 mm
Stress Nuclear Isotope Dose: 32 mCi
TID: 1.25

## 2022-07-29 LAB — TROPONIN I (HIGH SENSITIVITY)
Troponin I (High Sensitivity): 11 ng/L (ref <18)
Troponin I (High Sensitivity): 13 ng/L (ref <18)

## 2022-07-29 LAB — COMPREHENSIVE METABOLIC PANEL
ALT: 52 U/L — ABNORMAL HIGH (ref 0–44)
AST: 75 U/L — ABNORMAL HIGH (ref 15–41)
Albumin: 3.7 g/dL (ref 3.5–5.0)
Alkaline Phosphatase: 81 U/L (ref 38–126)
Anion gap: 7 (ref 5–15)
BUN: 10 mg/dL (ref 8–23)
CO2: 27 mmol/L (ref 22–32)
Calcium: 9.4 mg/dL (ref 8.9–10.3)
Chloride: 106 mmol/L (ref 98–111)
Creatinine, Ser: 0.88 mg/dL (ref 0.61–1.24)
GFR, Estimated: >60 mL/min (ref >60)
Glucose, Bld: 131 mg/dL — ABNORMAL HIGH (ref 70–99)
Potassium: 4.2 mmol/L (ref 3.5–5.1)
Sodium: 140 mmol/L (ref 135–145)
Total Bilirubin: 0.7 mg/dL (ref 0.3–1.2)
Total Protein: 6.9 g/dL (ref 6.5–8.1)

## 2022-07-29 LAB — RESP PANEL BY RT-PCR (RSV, FLU A&B, COVID)  RVPGX2
Influenza A by PCR: NEGATIVE
Influenza B by PCR: NEGATIVE
Resp Syncytial Virus by PCR: NEGATIVE
SARS Coronavirus 2 by RT PCR: NEGATIVE

## 2022-07-29 MED ORDER — WARFARIN - PHYSICIAN DOSING INPATIENT: Freq: Every day | Status: DC

## 2022-07-29 MED ORDER — ASPIRIN 81 MG PO CHEW
324.0000 mg | CHEWABLE_TABLET | Freq: Once | ORAL | Status: AC
  Administered 2022-07-29: 324 mg via ORAL
  Filled 2022-07-29: qty 4

## 2022-07-29 MED ORDER — TECHNETIUM TC 99M TETROFOSMIN IV KIT
10.0000 | PACK | Freq: Once | INTRAVENOUS | Status: AC | PRN
  Administered 2022-07-29: 10.8 via INTRAVENOUS

## 2022-07-29 MED ORDER — ACETAMINOPHEN 325 MG PO TABS
650.0000 mg | ORAL_TABLET | ORAL | Status: DC | PRN
  Administered 2022-07-29: 650 mg via ORAL
  Filled 2022-07-29: qty 2

## 2022-07-29 MED ORDER — ROSUVASTATIN CALCIUM 20 MG PO TABS
20.0000 mg | ORAL_TABLET | Freq: Every day | ORAL | Status: DC
  Administered 2022-07-29: 20 mg via ORAL
  Filled 2022-07-29: qty 1

## 2022-07-29 MED ORDER — ENOXAPARIN SODIUM 120 MG/0.8ML IJ SOSY
110.0000 mg | PREFILLED_SYRINGE | Freq: Two times a day (BID) | INTRAMUSCULAR | Status: DC
  Administered 2022-07-29: 110 mg via SUBCUTANEOUS
  Filled 2022-07-29: qty 0.8

## 2022-07-29 MED ORDER — FENTANYL CITRATE PF 50 MCG/ML IJ SOSY
50.0000 ug | PREFILLED_SYRINGE | Freq: Once | INTRAMUSCULAR | Status: AC
  Administered 2022-07-29: 50 ug via INTRAVENOUS
  Filled 2022-07-29: qty 1

## 2022-07-29 MED ORDER — PERFLUTREN LIPID MICROSPHERE
1.0000 mL | INTRAVENOUS | Status: AC | PRN
  Administered 2022-07-29: 4 mL via INTRAVENOUS

## 2022-07-29 MED ORDER — ALBUTEROL SULFATE (2.5 MG/3ML) 0.083% IN NEBU: 2.5000 mg | INHALATION_SOLUTION | Freq: Four times a day (QID) | RESPIRATORY_TRACT | Status: DC | PRN

## 2022-07-29 MED ORDER — HEPARIN SODIUM (PORCINE) 5000 UNIT/ML IJ SOLN: 5000.0000 [IU] | Freq: Three times a day (TID) | INTRAMUSCULAR | Status: DC

## 2022-07-29 MED ORDER — PANTOPRAZOLE SODIUM 40 MG PO TBEC
40.0000 mg | DELAYED_RELEASE_TABLET | Freq: Every day | ORAL | Status: DC
  Administered 2022-07-29: 40 mg via ORAL
  Filled 2022-07-29: qty 1

## 2022-07-29 MED ORDER — SODIUM CHLORIDE FLUSH 0.9 % IV SOLN
INTRAVENOUS | Status: AC
  Administered 2022-07-29: 10 mL via INTRAVENOUS
  Filled 2022-07-29: qty 10

## 2022-07-29 MED ORDER — ONDANSETRON HCL 4 MG/2ML IJ SOLN
4.0000 mg | Freq: Four times a day (QID) | INTRAMUSCULAR | Status: DC | PRN
  Administered 2022-07-29: 4 mg via INTRAVENOUS
  Filled 2022-07-29: qty 2

## 2022-07-29 MED ORDER — SUCRALFATE 1 G PO TABS: 1.0000 g | ORAL_TABLET | Freq: Four times a day (QID) | ORAL | 0 refills | Status: DC

## 2022-07-29 MED ORDER — IOHEXOL 350 MG/ML SOLN
100.0000 mL | Freq: Once | INTRAVENOUS | Status: AC | PRN
  Administered 2022-07-29: 100 mL via INTRAVENOUS

## 2022-07-29 MED ORDER — UMECLIDINIUM BROMIDE 62.5 MCG/ACT IN AEPB
1.0000 | INHALATION_SPRAY | Freq: Every day | RESPIRATORY_TRACT | Status: DC
  Administered 2022-07-29: 1 via RESPIRATORY_TRACT
  Filled 2022-07-29: qty 7

## 2022-07-29 MED ORDER — REGADENOSON 0.4 MG/5ML IV SOLN
INTRAVENOUS | Status: AC
  Administered 2022-07-29: 0.4 mg via INTRAVENOUS
  Filled 2022-07-29: qty 5

## 2022-07-29 MED ORDER — POTASSIUM CHLORIDE 10 MEQ/100ML IV SOLN
10.0000 meq | INTRAVENOUS | Status: AC
  Administered 2022-07-29 (×2): 10 meq via INTRAVENOUS
  Filled 2022-07-29 (×2): qty 100

## 2022-07-29 MED ORDER — NICOTINE 21 MG/24HR TD PT24: 21.0000 mg | MEDICATED_PATCH | Freq: Every day | TRANSDERMAL | Status: DC

## 2022-07-29 MED ORDER — TECHNETIUM TC 99M TETROFOSMIN IV KIT
30.0000 | PACK | Freq: Once | INTRAVENOUS | Status: AC | PRN
  Administered 2022-07-29: 32 via INTRAVENOUS

## 2022-07-29 MED ORDER — NITROGLYCERIN 0.4 MG SL SUBL: 0.4000 mg | SUBLINGUAL_TABLET | SUBLINGUAL | 3 refills | Status: DC | PRN

## 2022-07-29 MED ORDER — FLUTICASONE FUROATE-VILANTEROL 100-25 MCG/ACT IN AEPB
1.0000 | INHALATION_SPRAY | Freq: Every day | RESPIRATORY_TRACT | Status: DC
  Administered 2022-07-29: 1 via RESPIRATORY_TRACT
  Filled 2022-07-29: qty 28

## 2022-07-29 MED ORDER — WARFARIN SODIUM 2 MG PO TABS
2.0000 mg | ORAL_TABLET | Freq: Every day | ORAL | Status: DC
  Filled 2022-07-29: qty 1

## 2022-07-29 NOTE — Progress Notes (Signed)
Ng Discharge Note  Admit Date:  07/28/2022 Discharge date: 07/29/2022   [place] Needs to be D/C'd Home per MD order.  AVS completed. Patient/caregiver able to verbalize understanding.  Discharge Medication: Allergies as of 07/29/2022   No Known Allergies      Medication List     TAKE these medications    acetaminophen 500 MG tablet Commonly known as: TYLENOL Take 2 tablets (1,000 mg total) by mouth every 6 (six) hours as needed for mild pain.   albuterol 108 (90 Base) MCG/ACT inhaler Commonly known as: VENTOLIN HFA Inhale 2 puffs into the lungs every 6 (six) hours as needed for wheezing or shortness of breath.   albuterol (2.5 MG/3ML) 0.083% nebulizer solution Commonly known as: PROVENTIL Take 2.5 mg by nebulization every 6 (six) hours as needed for wheezing or shortness of breath.   allopurinol 300 MG tablet Commonly known as: ZYLOPRIM Take 300 mg by mouth daily.   doxycycline 50 MG capsule Commonly known as: VIBRAMYCIN Take 50 mg by mouth daily.   nitroGLYCERIN 0.4 MG SL tablet Commonly known as: Nitrostat Place 1 tablet (0.4 mg total) under the tongue every 5 (five) minutes as needed for chest pain.   NON FORMULARY Pt uses a cpap nightly   pantoprazole 40 MG tablet Commonly known as: PROTONIX Take 1 tablet (40 mg total) by mouth daily.   rosuvastatin 20 MG tablet Commonly known as: CRESTOR TAKE 1 TABLET(20 MG) BY MOUTH DAILY   sucralfate 1 g tablet Commonly known as: Carafate Take 1 tablet (1 g total) by mouth 4 (four) times daily for 14 days.   Trelegy Ellipta 100-62.5-25 MCG/ACT Aepb Generic drug: Fluticasone-Umeclidin-Vilant INHALE 1 PUFF INTO THE LUNGS DAILY   warfarin 2 MG tablet Commonly known as: COUMADIN Take 1 tablet (2 mg total) by mouth daily. What changed: how much to take        Discharge Assessment: Vitals:   07/29/22 0942 07/29/22 1009  BP: (!) 144/90 (!) 157/95  Pulse: 63 73  Resp: 18 20  Temp: 97.9 F (36.6 C) 97.9  F (36.6 C)  SpO2: 95% 95%   Skin clean, dry and intact without evidence of skin break down, no evidence of skin tears noted. IV catheter discontinued intact. Site without signs and symptoms of complications - no redness or edema noted at insertion site, patient denies c/o pain - only slight tenderness at site.  Dressing with slight pressure applied.  D/c Instructions-Education: Discharge instructions given to patient/family with verbalized understanding. D/c education completed with patient/family including follow up instructions, medication list, d/c activities limitations if indicated, with other d/c instructions as indicated by MD - patient able to verbalize understanding, all questions fully answered. Patient instructed to return to ED, call 911, or call MD for any changes in condition.  Patient escorted via [place], and D/C home via private auto.  Richrd Prime, LPN [id number]/0321 2:24 PM

## 2022-07-29 NOTE — Care Management Obs Status (Signed)
[place] NOTIFICATION   Patient Details  Name: [name] MRN: [id number] Date of Birth: [birth year]   Medicare Observation Status Notification Given:  Yes    Iona Beard, [place] 07/29/2022, 10:42 AM

## 2022-07-29 NOTE — Progress Notes (Signed)
  Transition of Care [facility]) Screening Note   Patient Details  Name: [name] Date of Birth: [birth year]   Transition of Care [facility]) CM/SW Contact:    Iona Beard, [place] Phone Number: 07/29/2022, 10:42 AM    Transition of Care Department [facility]) has reviewed patient and no TOC needs have been identified at this time. We will continue to monitor patient advancement through interdisciplinary progression rounds. If new patient transition needs arise, please place a TOC consult.

## 2022-07-29 NOTE — Assessment & Plan Note (Signed)
-   Known coronary artery disease - Troponin normal 12, 13 - CTA shows advanced three-vessel coronary vascular disease without PE - Chest x-ray shows no acute cardiopulmonary disease - No reported trauma - Admission for consult with cardiology, possibly stress test?? - Continue aspirin, Crestor - Continue to monitor

## 2022-07-29 NOTE — Assessment & Plan Note (Signed)
Continue Protonix °

## 2022-07-29 NOTE — Assessment & Plan Note (Signed)
Continue statin. 

## 2022-07-29 NOTE — ED Provider Notes (Signed)
[facility] EMERGENCY DEPARTMENT  Provider Note  CSN: [id number] Arrival date & time: 07/28/22 2139  History Chief Complaint  Patient presents with   Chest Pain    [name] is a 66 y.o. 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 INR was elevated last week held his dose for a few days and started back on a lower dose 2 days ago. Here for gradually worsening sharp chest pain and SOB worse with deep breath. Not associated with cough or fever. Has begun to have some left neck and headache since arriving in the ED.    Home Medications Prior to Admission medications   Medication Sig Start Date End Date Taking? Authorizing Provider  acetaminophen (TYLENOL) 500 MG tablet Take 2 tablets (1,000 mg total) by mouth every 6 (six) hours as needed for mild pain. 08/21/20   Meuth, Brooke A, PA-C  albuterol (PROVENTIL) (2.5 MG/3ML) 0.083% nebulizer solution Take 2.5 mg by nebulization every 6 (six) hours as needed for wheezing or shortness of breath.    [provider]  albuterol (VENTOLIN HFA) 108 (90 Base) MCG/ACT inhaler Inhale 2 puffs into the lungs every 6 (six) hours as needed for wheezing or shortness of breath.    [provider]  allopurinol (ZYLOPRIM) 300 MG tablet Take 300 mg by mouth daily. 09/17/20   [provider]  doxycycline (VIBRAMYCIN) 50 MG capsule Take 50 mg by mouth daily. 01/15/22   [provider]  NON FORMULARY Pt uses a cpap nightly    [provider]  pantoprazole (PROTONIX) 40 MG tablet Take 1 tablet (40 mg total) by mouth daily. 04/29/22 07/24/22  Daneil Dolin, MD  rosuvastatin (CRESTOR) 20 MG tablet TAKE 1 TABLET(20 MG) BY MOUTH DAILY 04/22/22   Richardo Priest, MD  TRELEGY ELLIPTA 100-62.5-25 MCG/ACT AEPB INHALE 1 PUFF INTO THE LUNGS DAILY 07/08/22    Icard, Octavio Graves, DO  warfarin (COUMADIN) 2 MG tablet Take 1 tablet (2 mg total) by mouth daily. 07/25/22   Mallipeddi, Quenten Raven, MD     Allergies    Patient has no known allergies.   Review of Systems   Review of Systems Please see HPI for pertinent positives and negatives  Physical Exam BP 128/88   Pulse 70   Temp 98.1 F (36.7 C) (Oral)   Resp 18   Ht '6\' 2"'$  (1.88 m)   Wt 108.9 kg   SpO2 97%   BMI 30.81 kg/m   Physical Exam Vitals and nursing note reviewed.  Constitutional:      Appearance: Normal appearance.  HENT:     Head: Normocephalic and atraumatic.     Nose: Nose normal.     Mouth/Throat:     Mouth: Mucous membranes are moist.  Eyes:     Extraocular Movements: Extraocular movements intact.     Conjunctiva/sclera: Conjunctivae normal.  Cardiovascular:     Rate and Rhythm: Normal rate.  Pulmonary:     Effort: Pulmonary effort is normal.     Breath sounds: Normal breath sounds.  Abdominal:     General: Abdomen is flat.     Palpations: Abdomen is soft.     Tenderness: There is no abdominal tenderness.  Musculoskeletal:        General: No swelling. Normal range of motion.     Cervical back:  Neck supple.  Skin:    General: Skin is warm and dry.  Neurological:     General: No focal deficit present.     Mental Status: He is alert.  Psychiatric:        Mood and Affect: Mood normal.     ED Results / Procedures / Treatments   EKG None  Procedures Procedures  Medications Ordered in the ED Medications  rosuvastatin (CRESTOR) tablet 20 mg (has no administration in time range)  pantoprazole (PROTONIX) EC tablet 40 mg (has no administration in time range)  warfarin (COUMADIN) tablet 2 mg (has no administration in time range)  albuterol (PROVENTIL) (2.5 MG/3ML) 0.083% nebulizer solution 2.5 mg (has no administration in time range)  fluticasone furoate-vilanterol (BREO ELLIPTA) 100-25 MCG/ACT 1 puff (has no administration in time range)    And   umeclidinium bromide (INCRUSE ELLIPTA) 62.5 MCG/ACT 1 puff (has no administration in time range)  acetaminophen (TYLENOL) tablet 650 mg (has no administration in time range)  ondansetron (ZOFRAN) injection 4 mg (has no administration in time range)  potassium chloride 10 mEq in 100 mL IVPB (10 mEq Intravenous New Bag/Given 07/29/22 0418)  fentaNYL (SUBLIMAZE) injection 50 mcg (50 mcg Intravenous Given 07/29/22 0020)  iohexol (OMNIPAQUE) 350 MG/ML injection 100 mL (100 mLs Intravenous Contrast Given 07/29/22 0025)  aspirin chewable tablet 324 mg (324 mg Oral Given 07/29/22 0202)    Initial Impression and Plan  Patient here for CP, SOB worsening through the day. Has CAD but non-obstructive. More concerning is his history of DVT/PE and now a subtherapeutic INR on labs done in triage. He also had a normal CBC, BMP and Trop. Will give fentanyl for pain, send for CTA to eval PE.   ED Course   Clinical Course as of 07/29/22 0509  Tue Jul 29, 2022  0154 I personally viewed the images from radiology studies and agree with radiologist interpretation: CTA is neg for PE but does show coronary calcifications. Will plan admission for further evaluation of his pain. Patient is amenable.  [CS]  0155 Nasal swab is neg. Repeat trop unchanged.  [CS]  28 Spoke with Dr. Ander Slade, Hospitalist, who will evaluate for admission.  [CS]    Clinical Course User Index [CS] Truddie Hidden, MD     MDM Rules/Calculators/A&P Medical Decision Making Problems Addressed: Chest pain, unspecified type: acute illness or injury  Amount and/or Complexity of Data Reviewed Labs: ordered. Decision-making details documented in ED Course. Radiology: ordered and independent interpretation performed. Decision-making details documented in ED Course. ECG/medicine tests: ordered and independent interpretation performed. Decision-making details documented in ED Course.  Risk OTC drugs. Prescription drug  management. Decision regarding hospitalization.    Final Clinical Impression(s) / ED Diagnoses Final diagnoses:  Chest pain, unspecified type    Rx / DC Orders ED Discharge Orders     None        Truddie Hidden, MD 07/29/22 [phone]

## 2022-07-29 NOTE — Assessment & Plan Note (Signed)
-   Patient is requiring high flow nasal cannula to maintain oxygen sats - Patient refused BiPAP - Multifactorial, with A-fib RVR

## 2022-07-29 NOTE — Assessment & Plan Note (Deleted)
-   Exacerbation with wheezing, cough, oxygen requirement - Patient refused BiPAP - Continue albuterol as needed, DuoNeb scheduled, Trelegy scheduled - Start steroid - Continue to monitor

## 2022-07-29 NOTE — Consult Note (Addendum)
Cardiology Consultation   Patient ID: [name] MRN: [id number]; DOB: 28-Jan-1956  Admit date: 07/28/2022 Date of Consult: 07/29/2022  PCP:  [name], [place] Prov[facility] Cardiologist:  None   {   Patient Profile:   [name] is a 66 y.o. male with a hx of mild to moderate CAD (nonflow limiting LAD disease in 2022), normal LVEF, heterozygous factor V Leiden mutation with recurrent PE and DVT (failed Eliquis and currently trying to optimize Coumadin with goal INR between 2 and 3), hyperlipidemia, chronic venous insufficiency, hypertension, COPD, OSA who is being seen 07/29/2022 for the evaluation of chest pain at the request of Dr. Clearence Ped.  History of Present Illness:   Mr. Zaremba had chest pain for which she underwent cardiac CTA in 07/23/2021 which showed coronary calcium score of 2641 and CAD RADS 4 severe stenosis (severe stenosis of proximal LAD).  He subsequently underwent left heart cath which showed nonflow limiting proximal LAD disease, overall mild to moderate CAD.  He has a history of factor V Leiden mutation and has had multiple episodes of PE and DVT.  He had a thromboembolism on Eliquis and it was therefore considered an Eliquis failure.  He was subsequently switched to Coumadin.  Patient was last seen 07/24/2022.  The Coumadin clinic was having difficulty managing his INR it was supratherapeutic 4-6.  Patient was given Coumadin instructions.  He was referred to the Coumadin clinic in [place] with a goal INR between 2 and 3.  The patient presented to the ER 07/29/2022 with chest pain. Chest pain started yesterday and was progressive throughout the day. It was in the epigastric region described as a tightness. It was worse with a deep breath, not worse with exertion. He reports associated shortness of breath. He reports history of hiatal hernia and GERD. He denies recent fever, chills, LLE.   In the ER blood pressure was 128/88, pulse  70, afebrile, respiratory rate 18, 97% O2.  Chest CTA was negative for PE, did show coronary calcifications.  INR came back at 1.7.  Labs showed potassium 3.4, platelets 141.  High-sensitivity troponin was 12, 13, 11. EKG showed NSR with no changes.The patient was given ASA '324mg'$  once and admitted for further work-up.  Past Medical History:  Diagnosis Date   Bilateral shoulder bursitis    Chronic back pain    COPD (chronic obstructive pulmonary disease) with emphysema ([name])    DDD (degenerative disc disease), lumbar    hx epideral injection  L5--S1    Diverticulitis    DVT (deep venous thrombosis) ([place]) 11/19/2018   Dysphasia    intermittant w/ food    Dyspnea    Dyspnea on exertion    Fatty liver    GERD (gastroesophageal reflux disease)    Gout    L great toe   Heterozygous factor V Leiden mutation ([place])    Hiatal hernia    Hip problem 2020   left, seeing orthopedics   History of acute pancreatitis    alcoholic pancreatitis [id number] and 01-30-2012. 2021   History of colon polyps    10-17-2005  hyperplastic polyp's   History of esophageal stricture    s/p  dilation 02-02-2017   History of peptic ulcer 1990s   History of pneumococcal pneumonia 2006   bilateral pneumonia w/ left lower lobe abscess treated w/ chest tube with suction for drainage   History of pneumothorax    1984--  left spontaneous pneumothorax ,  treated  w/ chest tube   Hypertension    Malignant neoplasm of prostate [facility]) urologist-  dr dahlstedt/  oncologist -- dr Tammi Klippel   dx 12-02-2016-- Stage T2b, Gleason 3+4,  PSA 5.99,  vol 25cc   Neuropathy    Numbness of left foot    outer left ankle numb-- per pt has appt. w/ neurologist   OSA (obstructive sleep apnea)    per pt has not used cpap over year ago from 04-16-2017--- per study 01-17-2010  severe osa   Pneumonia    Solid nodule of lung greater than 8 mm in diameter 05/11/2018   03/2018: RUL Nodule, 58m   Wears glasses     Past Surgical History:   Procedure Laterality Date   BIOPSY  02/02/2017   Procedure: BIOPSY;  Surgeon: RDaneil Dolin MD;  [facility];  Service: Endoscopy;;  duodenal bx's, esophgeal bx's   BIOPSY  07/30/2017   Procedure: BIOPSY;  Surgeon: RDaneil Dolin MD;  [facility];  Service: Endoscopy;;  esophagus   BIOPSY  02/15/2021   Procedure: BIOPSY;  Surgeon: RDaneil Dolin MD;  [facility];  Service: Endoscopy;;   CARDIOVASCULAR STRESS TEST  09/12/2009   normal nuclear perfusion study w/ no ischemia /  normal LV function and wall motion , ef 57%   COLONOSCOPY  2007   Dr. RGala Romney hyperplastic polyps, internal hemorrhoids    COLONOSCOPY WITH PROPOFOL N/A 05/03/2018   Dr. RGala Romney diverticulosis in entire colon, multiple polyps in sigmoid, at splenic flexure, and ascending colon, not all polyps removed. Tubular adenomas and inflammatory polyps. Needs 3 year surveilance   COLONOSCOPY WITH PROPOFOL N/A 02/15/2021   Many polyps without adenomatous changes but all inflammatory in nature with mildly active colitis and chronic inflammatory mucosa.   EPIDURAL BLOCK INJECTION  2020   ESOPHAGOGASTRODUODENOSCOPY (EGD) WITH PROPOFOL N/A 02/02/2017   Dr. RGala Romney esophageal stenosis s/p dilation and biopsy, medium sized hiatal hernia, normal duodenum   ESOPHAGOGASTRODUODENOSCOPY (EGD) WITH PROPOFOL N/A 07/30/2017   Dr. RGala Romney esophageal stenosis s/p dilation and biopsy, medium-sized hiatal hernia, normal duodenum   HEMORRHOID SURGERY  01-21-2008    AForestine Na  HIP ARTHROPLASTY Bilateral    INTRAVASCULAR PRESSURE WIRE/FFR STUDY N/A 08/01/2021   Procedure: INTRAVASCULAR PRESSURE WIRE/FFR STUDY;  Surgeon: MBurnell Blanks MD;  [facility];  Service: Cardiovascular;  Laterality: N/A;   IR RADIOLOGIST EVAL & MGMT  08/29/2020   LEFT HEART CATH AND CORONARY ANGIOGRAPHY N/A 08/01/2021   Procedure: LEFT HEART CATH AND CORONARY ANGIOGRAPHY;  Surgeon: MBurnell Blanks  MD;  [facility];  Service: Cardiovascular;  Laterality: N/A;   MALONEY DILATION N/A 02/02/2017   Procedure: MVenia MinksDILATION;  Surgeon: RDaneil Dolin MD;  [facility];  Service: Endoscopy;  Laterality: N/A;   MALONEY DILATION N/A 07/30/2017   Procedure: MVenia MinksDILATION;  Surgeon: RDaneil Dolin MD;  [facility];  Service: Endoscopy;  Laterality: N/A;   POLYPECTOMY  05/03/2018   Procedure: POLYPECTOMY;  Surgeon: RDaneil Dolin MD;  [facility];  Service: Endoscopy;;  ascending colon polyp, sigmoid colon polyps (multiple)   POLYPECTOMY  02/15/2021   Procedure: POLYPECTOMY;  Surgeon: RDaneil Dolin MD;  [facility];  Service: Endoscopy;;   RADIOACTIVE SEED IMPLANT N/A 04/23/2017   Procedure: RADIOACTIVE SEED IMPLANT/BRACHYTHERAPY IMPLANT;  Surgeon: DFranchot Gallo MD;  [facility]  Service: Urology;  Laterality: N/A;  SHOULDER ARTHROSCOPY  08/05/2003   SPACE OAR INSTILLATION N/A 04/23/2017   Procedure: SPACE OAR INSTILLATION;  Surgeon: Franchot Gallo, MD;  Location: [facility];  Service: Urology;  Laterality: N/A;   TRANSTHORACIC ECHOCARDIOGRAM  03/28/2009   mild LVH, ef 55-60%/ mild aorta calcification   VASECTOMY  08/05/1983   VIDEO ASSISTED THORACOSCOPY (VATS)/DECORTICATION Left      Home Medications:  Prior to Admission medications   Medication Sig Start Date End Date Taking? Authorizing Provider  acetaminophen (TYLENOL) 500 MG tablet Take 2 tablets (1,000 mg total) by mouth every 6 (six) hours as needed for mild pain. 08/21/20  Yes Meuth, Brooke A, PA-C  albuterol (PROVENTIL) (2.5 MG/3ML) 0.083% nebulizer solution Take 2.5 mg by nebulization every 6 (six) hours as needed for wheezing or shortness of breath.   Yes [provider]  albuterol (VENTOLIN HFA) 108 (90 Base) MCG/ACT inhaler Inhale 2 puffs into the lungs every 6 (six) hours as needed for wheezing  or shortness of breath.   Yes [provider]  allopurinol (ZYLOPRIM) 300 MG tablet Take 300 mg by mouth daily. 09/17/20  Yes [provider]  doxycycline (VIBRAMYCIN) 50 MG capsule Take 50 mg by mouth daily. 01/15/22  Yes [provider]  pantoprazole (PROTONIX) 40 MG tablet Take 1 tablet (40 mg total) by mouth daily. 04/29/22 07/29/22 Yes Rourk, [name], MD  rosuvastatin (CRESTOR) 20 MG tablet TAKE 1 TABLET(20 MG) BY MOUTH DAILY 04/22/22  Yes Richardo Priest, MD  TRELEGY ELLIPTA 100-62.5-25 MCG/ACT AEPB INHALE 1 PUFF INTO THE LUNGS DAILY 07/08/22  Yes Icard, Leory Plowman L, DO  warfarin (COUMADIN) 2 MG tablet Take 1 tablet (2 mg total) by mouth daily. Patient taking differently: Take 1 mg by mouth daily. 07/25/22  Yes Mallipeddi, Vishnu P, MD  NON FORMULARY Pt uses a cpap nightly    [provider]    Inpatient Medications: Scheduled Meds:  enoxaparin (LOVENOX) injection  1 mg/kg Subcutaneous Q12H   fluticasone furoate-vilanterol  1 puff Inhalation Daily   And   umeclidinium bromide  1 puff Inhalation Daily   pantoprazole  40 mg Oral Daily   rosuvastatin  20 mg Oral Daily   warfarin  2 mg Oral Daily   Continuous Infusions:  PRN Meds: acetaminophen, albuterol, ondansetron (ZOFRAN) IV  Allergies:   No Known Allergies  Social History:   Social History   Socioeconomic History   Marital status: Married    Spouse name: Not on file   Number of children: 2   Years of education: Not on file   Highest education level: Bachelor's degree (e.g., BA, AB, BS)  Occupational History   Not on file  Tobacco Use   Smoking status: Former    Packs/day: 1.00    Years: 30.00    Total pack years: 30.00    Types: Cigarettes, E-cigarettes    Quit date: 10/03/2015    Years since quitting: 6.8    Passive exposure: Past   Smokeless tobacco: Never   Tobacco comments:    stoped vaping 2018  Vaping Use   Vaping Use: Former  Substance and Sexual Activity   Alcohol use:  Yes    Alcohol/week: 5.0 standard drinks of alcohol    Types: 5 Glasses of wine per week   Drug use: No   Sexual activity: Yes    Birth control/protection: None  Other Topics Concern   Not on file  Social History Narrative   Lives at home with his wife  Right handed   Social Determinants of Health   Financial Resource Strain: Not on file  Food Insecurity: Not on file  Transportation Needs: Not on file  Physical Activity: Not on file  Stress: Not on file  Social Connections: Not on file  Intimate Partner Violence: Not on file    Family History:   Family History  Problem Relation Age of Onset   Diabetes Mother    Heart disease Father    Heart disease Brother    Cancer Neg Hx    Colon cancer Neg Hx    Colon polyps Neg Hx    Neuropathy Neg Hx      ROS:  Please see the history of present illness.   All other ROS reviewed and negative.     Physical Exam/Data:   Vitals:   07/29/22 0430 07/29/22 0600 07/29/22 0613 07/29/22 0630  BP: 128/88 139/86  (!) 132/97  Pulse: 70 74  72  Resp: '18 18  18  '$ Temp:   98 F (36.7 C)   TempSrc:   Oral   SpO2: 97% 95%  95%  Weight:      Height:        Intake/Output Summary (Last 24 hours) at 07/29/2022 0750 Last data filed at 07/29/2022 0521 Gross per 24 hour  Intake 200.48 ml  Output --  Net 200.48 ml      07/28/2022   10:23 PM 07/24/2022    8:00 AM 07/10/2022    8:43 AM  Last 3 Weights  Weight (lbs) 240 lb 243 lb 238 lb 8.6 oz  Weight (kg) 108.863 kg 110.224 kg 108.2 kg     Body mass index is 30.81 kg/m.  General:  Well nourished, well developed, in no acute distress HEENT: normal Neck: no JVD Vascular: No carotid bruits; Distal pulses 2+ bilaterally Cardiac:  normal S1, S2; RRR; no murmur  Lungs:  clear to auscultation bilaterally, no wheezing, rhonchi or rales  Abd: soft, nontender, no hepatomegaly  Ext: no edema Musculoskeletal:  No deformities, BUE and BLE strength normal and equal Skin: warm and dry   Neuro:  CNs 2-12 intact, no focal abnormalities noted Psych:  Normal affect   EKG:  The EKG was personally reviewed and demonstrates:  NSR 82bpm, nonspecific T wave changes Telemetry:  Telemetry was personally reviewed and demonstrates:  NSR, rare PAC/PVCs, HR 70s  Relevant CV Studies:  Echo 08/2021 1. Left ventricular ejection fraction, by estimation, is 60 to 65%. The  left ventricle has normal function. The left ventricle has no regional  wall motion abnormalities. There is moderate concentric left ventricular  hypertrophy. Left ventricular  diastolic parameters are consistent with Grade I diastolic dysfunction  (impaired relaxation).   2. Right ventricular systolic function is normal. The right ventricular  size is normal. Tricuspid regurgitation signal is inadequate for assessing  PA pressure.   3. The mitral valve is normal in structure. No evidence of mitral valve  regurgitation. No evidence of mitral stenosis.   4. The aortic valve is tricuspid. Aortic valve regurgitation is not  visualized. No aortic stenosis is present.   5. The inferior vena cava is normal in size with greater than 50%  respiratory variability, suggesting right atrial pressure of 3 mmHg.   6. Prominent pericardial fat.   Cardiac cath 07/2021   Prox LAD lesion is 50% stenosed.   Mid LAD lesion is 30% stenosed.   The left ventricular systolic function is normal.   LV end diastolic pressure is  normal.   The left ventricular ejection fraction is 55-65% by visual estimate.   There is no mitral valve regurgitation.   Moderate, calcified proximal LAD stenosis. RFR 0.97-0.96 suggesting this lesion is not flow limiting. Mild mid LAD stenosis.  Large caliber Circumflex artery with mild mid stenosis Large dominant RCA with mild, calcified proximal and mid vessel stenosis.  Normal LVEDP Normal LV systolic function, IWLN=98%   Recommendations: medical management of non-obstructive CAD. His proximal LAD lesion  is not flow limiting by pressure wire analysis (RFR 0.96-0.97). Continue ASA and statin. His dyspnea is likely related to his chronic lung disease.     Coronary Diagrams  Diagnostic Dominance: Right     Laboratory Data:  High Sensitivity Troponin:   Recent Labs  Lab 07/28/22 2236 07/29/22 0004 07/29/22 0543  TROPONINIHS '12 13 11     '$ Chemistry Recent Labs  Lab 07/28/22 2236 07/29/22 0543  NA 139 140  K 3.4* 4.2  CL 105 106  CO2 23 27  GLUCOSE 125* 131*  BUN 11 10  CREATININE 0.85 0.88  CALCIUM 9.3 9.4  GFRNONAA >60 >60  ANIONGAP 11 7    Recent Labs  Lab 07/29/22 0543  PROT 6.9  ALBUMIN 3.7  AST 75*  ALT 52*  ALKPHOS 81  BILITOT 0.7   Lipids No results for input(s): "CHOL", "TRIG", "HDL", "LABVLDL", "LDLCALC", "CHOLHDL" in the last 168 hours.  Hematology Recent Labs  Lab 07/28/22 2236  WBC 9.4  RBC 4.40  HGB 14.6  HCT 43.0  MCV 97.7  MCH 33.2  MCHC 34.0  RDW 13.5  PLT 141*   Thyroid No results for input(s): "TSH", "FREET4" in the last 168 hours.  BNPNo results for input(s): "BNP", "PROBNP" in the last 168 hours.  DDimer No results for input(s): "DDIMER" in the last 168 hours.   Radiology/Studies:  CT Angio Chest PE W/Cm &/Or Wo Cm  Result Date: 07/29/2022 CLINICAL DATA:  Concern for pulmonary embolism. EXAM: CT ANGIOGRAPHY CHEST WITH CONTRAST TECHNIQUE: Multidetector CT imaging of the chest was performed using the standard protocol during bolus administration of intravenous contrast. Multiplanar CT image reconstructions and MIPs were obtained to evaluate the vascular anatomy. RADIATION DOSE REDUCTION: This exam was performed according to the departmental dose-optimization program which includes automated exposure control, adjustment of the mA and/or kV according to patient size and/or use of iterative reconstruction technique. CONTRAST:  179m OMNIPAQUE IOHEXOL 350 MG/ML SOLN COMPARISON:  CT of the chest dated 08/16/2021. FINDINGS: Cardiovascular:  Top-normal cardiac size. No pericardial effusion. Advanced 3 vessel coronary vascular calcification. Moderate atherosclerotic calcification of the thoracic aorta. No aneurysmal dilatation or dissection. The origins of the great vessels of the aortic arch appear patent as visualized. No pulmonary artery embolus identified. Mediastinum/Nodes: No hilar or mediastinal adenopathy. The esophagus is is grossly unremarkable. No mediastinal fluid collection. Lungs/Pleura: Background of centrilobular emphysema. No focal consolidation, pleural effusion, or pneumothorax. A 1 cm right apical subpleural nodule (14/6), minimally progressed since the CT of 08/03/2019 a most consistent with scarring. The central airways are patent. Upper Abdomen: Fatty liver. Musculoskeletal: No acute osseous pathology. Review of the MIP images confirms the above findings. IMPRESSION: 1. No acute intrathoracic pathology. No CT evidence of pulmonary artery embolus. 2. Advanced 3 vessel coronary vascular calcification. 3. Fatty liver. 4. Aortic Atherosclerosis (ICD10-I70.0) and Emphysema (ICD10-J43.9). Electronically Signed   By: AAnner CreteM.D.   On: 07/29/2022 00:55   DG Chest 2 View  Result Date: 07/28/2022 CLINICAL DATA:  Chest  pain. EXAM: CHEST - 2 VIEW COMPARISON:  Chest radiograph dated 05/27/2022. FINDINGS: No focal consolidation, pleural effusion, or pneumothorax. Mild diffuse chronic interstitial coarsening. Stable cardiomediastinal silhouette. Atherosclerotic calcification of the aorta. Degenerative changes of the spine. No acute osseous pathology. IMPRESSION: No active cardiopulmonary disease. Electronically Signed   By: Anner Crete M.D.   On: 07/28/2022 23:11     Assessment and Plan:   Chest pain CAD - patient presented with atypical chest pain given ASA '324mg'$  with minimal improvement. He reports persistent chest tightness in the upper epigastric area. HS troponin negative x3. EKG with no new changes. Chest CTA  negative for PE.  - he has known CAD by cath in 122/2022 showing moderate calcified pLAD stenosis with RFR 0.97-0.96 suggesting lesion is not flow limiting, mild mid LAD stenosis, other wise mild to moderate CAD, normal LVEF - echo in 08/2021 showed LVEF 60-65%, no WA, G1DD. Repeat echo ordered - continue Crestor - No ASA with warfarin. INR goal 2-3. He has been bridged with Lovenox - echo ordered. I will order [place] lexiscan. More suspicious this is related to hiatal hernia/GERD  Factor V Leiden H/o PE/DVT - recent supratherapeutic INR as outpatient before admission - this admission INR subtherapeutic at 1.6 - he is being bridged with Lovenox - Chest CTA negative for PE  HTN - PTA not on meds - BB stopped at the last visit due to patient request - Bps reasonable, may need to resume BB  HLD - continue Crestor - lipid panel ordered  GERD - continue Protonix  Hypokalemia - K3.4>supplemented    For questions or updates, please contact [place] Please consult [email or web address] for contact info under    Signed, [place], PA-C  07/29/2022 7:50 AM   Attending note   Patient seen and discussed with PA Further, I agree with her documentation. 66 yo male with history of moderate nonobstructive CAD by cath 2022, factor V leiden with prior DVT/PE with reported eliquis failure now on coumadin, COPD, GERD, HTN, OSA presents with chest pain.   Reports pain started yesterday 1pm while at rest. Pressure 8-10/10 midchest/epigastric with some associated SOB. Worst with deep breathing. Lasts constant about 8 hours then came to ER. Reports overall pain has been constant since onset yesterday but less severe this AM.      INR 1.7 K 3.4 Cr 0.85 BUN 11 WBC 9.4 Hgb 14.6 Plt 141 Trop 12-->13 EKG SR, no acute ischemic changes CXR no acute process CT PE: no acute pathology, advanced 3 vessel CAD   07/2021 cath: prox LAD 50% and mid 30%, prox LCX 25%, ostial RCA 10% and prox  to mid 25%. LAD lesion negative by FFR.      1.Chest pain 07/2021 cath mild to moderate nonobstructive disease -no objective evidence of ischemia by EKG or enzymes - somewhat atypical chest pain particularly given the duration, he is also somewhat tender in the epigastric area. Given his known moderate CAD would plan for additional testing to better assure symptoms are not cardiac related. F/u echo, lexiscan today.        2.Factor V [place] with prior PE/DVT - from notes failed eliquis, on coumadin - INR subtherapeutic  on admission. Being bridged with [place] lovenox - CT PE negative.     Carlyle Dolly MD

## 2022-07-29 NOTE — Progress Notes (Signed)
*  PRELIMINARY RESULTS* Echocardiogram 2D Echocardiogram has been performed with [facility].  Samuel Germany 07/29/2022, 9:50 AM

## 2022-07-29 NOTE — Discharge Summary (Signed)
Physician Discharge Summary  XAI FRERKING TKW:[id number] DOB: [birth year] DOA: 07/28/2022  PCP: [name], MD  Admit date: 07/28/2022  Discharge date: 07/29/2022  Admitted From:Home  Disposition:  Home  Recommendations for Outpatient Follow-up:  Follow up with PCP in 1-2 weeks Follow-up with cardiologist Dr. Dineen Kid with referral sent regarding chest pain and monitoring of INR while on warfarin Nitroglycerin provided as needed Continue Carafate as prescribed for 14 days and continue PPI Continue home medications  Home Health: None  Equipment/Devices: None  Discharge Condition:Stable  CODE STATUS: Full  Diet recommendation: Heart Healthy  Brief/Interim Summary: [name] is a 66 y.o. male with medical history significant of COPD, DVT, PE, factor V Leyden mutation, GERD, hypertension, OSA, and more presents the ED with a chief complaint of chest pain.  He was evaluated by cardiology and was thought to have atypical chest pain.  Given his prior history he underwent CT angiography to evaluate for PE and this was negative.  He underwent stress testing that was noted to be within normal limits and is now stable for discharge from cardiology standpoint.  2D echocardiogram as noted below with preserved LVEF.  He continues to have some intermittent symptoms that are thought to be related to GERD/hiatal hernia and will be given some nitroglycerin and Carafate as noted above and has been recommended to continue his Protonix.  No other acute events noted throughout the course of the stay.  Discharge Diagnoses:  Principal Problem:   Chest pain Active Problems:   GERD (gastroesophageal reflux disease)   Coronary artery disease involving native coronary artery of native heart without angina pectoris   Acute respiratory failure with hypoxia ([name])   Hypokalemia   Mixed hyperlipidemia   Factor V Leiden mutation ([place])   HTN (hypertension)   Subtherapeutic international  normalized ratio (INR)  Principal discharge diagnosis: Atypical chest pain, noncardiac and likely related to GERD/hiatal hernia.  Discharge Instructions  Discharge Instructions     Ambulatory referral to Cardiology   Complete by: As directed    Diet - low sodium heart healthy   Complete by: As directed    Increase activity slowly   Complete by: As directed       Allergies as of 07/29/2022   No Known Allergies      Medication List     TAKE these medications    acetaminophen 500 MG tablet Commonly known as: TYLENOL Take 2 tablets (1,000 mg total) by mouth every 6 (six) hours as needed for mild pain.   albuterol 108 (90 Base) MCG/ACT inhaler Commonly known as: VENTOLIN HFA Inhale 2 puffs into the lungs every 6 (six) hours as needed for wheezing or shortness of breath.   albuterol (2.5 MG/3ML) 0.083% nebulizer solution Commonly known as: PROVENTIL Take 2.5 mg by nebulization every 6 (six) hours as needed for wheezing or shortness of breath.   allopurinol 300 MG tablet Commonly known as: ZYLOPRIM Take 300 mg by mouth daily.   doxycycline 50 MG capsule Commonly known as: VIBRAMYCIN Take 50 mg by mouth daily.   nitroGLYCERIN 0.4 MG SL tablet Commonly known as: Nitrostat Place 1 tablet (0.4 mg total) under the tongue every 5 (five) minutes as needed for chest pain.   NON FORMULARY Pt uses a cpap nightly   pantoprazole 40 MG tablet Commonly known as: PROTONIX Take 1 tablet (40 mg total) by mouth daily.   rosuvastatin 20 MG tablet Commonly known as: CRESTOR TAKE 1 TABLET(20 MG) BY MOUTH DAILY  sucralfate 1 g tablet Commonly known as: Carafate Take 1 tablet (1 g total) by mouth 4 (four) times daily for 14 days.   Trelegy Ellipta 100-62.5-25 MCG/ACT Aepb Generic drug: Fluticasone-Umeclidin-Vilant INHALE 1 PUFF INTO THE LUNGS DAILY   warfarin 2 MG tablet Commonly known as: COUMADIN Take 1 tablet (2 mg total) by mouth daily. What changed: how much to take         Follow-up Information     [name], MD. Schedule an appointment as soon as possible for a visit in 1 week(s).   Specialty: Family Medicine Contact information: [place] [address] [phone]         Chalmers Guest, MD. Go to.   Specialties: Cardiology, Internal Medicine Contact information: [address] [place] [address] [phone]                No Known Allergies  Consultations: Cardiology   Procedures/Studies: NM Myocar Multi W/Spect W/Wall Motion / EF  Result Date: 07/29/2022   The study is normal. There are no perfusion defects. The study is low risk.   No ST deviation was noted.   LV perfusion is normal.   Left ventricular function is normal. End diastolic cavity size is normal.   ECHOCARDIOGRAM COMPLETE  Result Date: 07/29/2022    ECHOCARDIOGRAM REPORT   Patient Name:   [name] Date of Exam: 07/29/2022 Medical Rec #:  [id number]        Height:       74.0 in Accession #:    [id number]       Weight:       240.0 lb Date of Birth:  12/24/1955        BSA:          2.349 m Patient Age:    66 years         BP:           134/103 mmHg Patient Gender: M                HR:           65 bpm. Exam Location:  [facility] Procedure: 2D Echo, Cardiac Doppler and Color Doppler Indications:    Chest Pain R07.9  History:        Patient has prior history of Echocardiogram examinations, most                 recent 08/17/2021. CAD, COPD; Risk Factors:Dyslipidemia and                 Hypertension. Hx of Alcohol abuse, DVT (deep venous thrombosis),                 Malignant tumor of prostate, Obstructive sleep apnea treated                 with continuous positive airway pressure (CPAP), Tobacco abuse.  Sonographer:    Alvino Chapel RCS Referring Phys: [id number] [place]  1. Left ventricular ejection fraction, by estimation, is 55 to 60%. The left ventricle has normal function. The left ventricle has no regional  wall motion abnormalities. There is mild left ventricular hypertrophy. Left ventricular diastolic parameters are consistent with Grade I diastolic dysfunction (impaired relaxation).  2. Right ventricular systolic function is normal. The right ventricular size is normal.  3. The mitral valve is normal in structure. No evidence of mitral valve regurgitation. No evidence of mitral stenosis.  4.  The aortic valve is tricuspid. Aortic valve regurgitation is not visualized. No aortic stenosis is present.  5. Aortic dilatation noted. There is mild dilatation of the aortic root, measuring 40 mm. There is mild dilatation of the ascending aorta, measuring 40 mm.  6. The inferior vena cava is normal in size with greater than 50% respiratory variability, suggesting right atrial pressure of 3 mmHg. FINDINGS  Left Ventricle: Left ventricular ejection fraction, by estimation, is 55 to 60%. The left ventricle has normal function. The left ventricle has no regional wall motion abnormalities. Definity contrast agent was given IV to delineate the left ventricular  endocardial borders. The left ventricular internal cavity size was normal in size. There is mild left ventricular hypertrophy. Left ventricular diastolic parameters are consistent with Grade I diastolic dysfunction (impaired relaxation). Normal left ventricular filling pressure. Right Ventricle: The right ventricular size is normal. Right vetricular wall thickness was not well visualized. Right ventricular systolic function is normal. Left Atrium: Left atrial size was normal in size. Right Atrium: Right atrial size was normal in size. Pericardium: There is no evidence of pericardial effusion. Presence of epicardial fat layer. Mitral Valve: The mitral valve is normal in structure. No evidence of mitral valve regurgitation. No evidence of mitral valve stenosis. Tricuspid Valve: The tricuspid valve is normal in structure. Tricuspid valve regurgitation is not demonstrated. No  evidence of tricuspid stenosis. Aortic Valve: The aortic valve is tricuspid. Aortic valve regurgitation is not visualized. No aortic stenosis is present. Aortic valve mean gradient measures 4.0 mmHg. Aortic valve peak gradient measures 7.5 mmHg. Aortic valve area, by VTI measures 3.77 cm. Pulmonic Valve: The pulmonic valve was not well visualized. Pulmonic valve regurgitation is mild. No evidence of pulmonic stenosis. Aorta: Aortic dilatation noted. There is mild dilatation of the aortic root, measuring 40 mm. There is mild dilatation of the ascending aorta, measuring 40 mm. Venous: The inferior vena cava is normal in size with greater than 50% respiratory variability, suggesting right atrial pressure of 3 mmHg. IAS/Shunts: No atrial level shunt detected by color flow Doppler.  LEFT VENTRICLE PLAX 2D LVIDd:         4.97 cm   Diastology LVIDs:         3.63 cm   LV e' medial:    5.22 cm/s LV PW:         1.30 cm   LV E/e' medial:  15.7 LV IVS:        1.20 cm   LV e' lateral:   8.49 cm/s LVOT diam:     2.20 cm   LV E/e' lateral: 9.6 LV SV:         109 LV SV Index:   47 LVOT Area:     3.80 cm  RIGHT VENTRICLE RV S prime:     16.40 cm/s TAPSE (M-mode): 2.3 cm LEFT ATRIUM             Index        RIGHT ATRIUM           Index LA diam:        3.70 cm 1.58 cm/m   RA Area:     18.40 cm LA Vol (A2C):   56.6 ml 24.10 ml/m  RA Volume:   58.30 ml  24.82 ml/m LA Vol (A4C):   63.2 ml 26.91 ml/m LA Biplane Vol: 66.7 ml 28.40 ml/m  AORTIC VALVE AV Area (Vmax):    3.52 cm AV Area (Vmean):   3.43 cm  AV Area (VTI):     3.77 cm AV Vmax:           137.26 cm/s AV Vmean:          93.834 cm/s AV VTI:            0.291 m AV Peak Grad:      7.5 mmHg AV Mean Grad:      4.0 mmHg LVOT Vmax:         127.00 cm/s LVOT Vmean:        84.700 cm/s LVOT VTI:          0.288 m LVOT/AV VTI ratio: 0.99  AORTA Ao Root diam: 4.00 cm MITRAL VALVE MV Area (PHT): 2.99 cm    SHUNTS MV Decel Time: 254 msec    Systemic VTI:  0.29 m MV E velocity: 81.80  cm/s  Systemic Diam: 2.20 cm MV A velocity: 99.80 cm/s MV E/A ratio:  0.82 Carlyle Dolly MD Electronically signed by Carlyle Dolly MD Signature Date/Time: 07/29/2022/11:09:05 AM    Final    CT Angio Chest PE W/Cm &/Or Wo Cm  Result Date: 07/29/2022 CLINICAL DATA:  Concern for pulmonary embolism. EXAM: CT ANGIOGRAPHY CHEST WITH CONTRAST TECHNIQUE: Multidetector CT imaging of the chest was performed using the standard protocol during bolus administration of intravenous contrast. Multiplanar CT image reconstructions and MIPs were obtained to evaluate the vascular anatomy. RADIATION DOSE REDUCTION: This exam was performed according to the departmental dose-optimization program which includes automated exposure control, adjustment of the mA and/or kV according to patient size and/or use of iterative reconstruction technique. CONTRAST:  125m OMNIPAQUE IOHEXOL 350 MG/ML SOLN COMPARISON:  CT of the chest dated 08/16/2021. FINDINGS: Cardiovascular: Top-normal cardiac size. No pericardial effusion. Advanced 3 vessel coronary vascular calcification. Moderate atherosclerotic calcification of the thoracic aorta. No aneurysmal dilatation or dissection. The origins of the great vessels of the aortic arch appear patent as visualized. No pulmonary artery embolus identified. Mediastinum/Nodes: No hilar or mediastinal adenopathy. The esophagus is is grossly unremarkable. No mediastinal fluid collection. Lungs/Pleura: Background of centrilobular emphysema. No focal consolidation, pleural effusion, or pneumothorax. A 1 cm right apical subpleural nodule (14/6), minimally progressed since the CT of 08/03/2019 a most consistent with scarring. The central airways are patent. Upper Abdomen: Fatty liver. Musculoskeletal: No acute osseous pathology. Review of the MIP images confirms the above findings. IMPRESSION: 1. No acute intrathoracic pathology. No CT evidence of pulmonary artery embolus. 2. Advanced 3 vessel coronary vascular  calcification. 3. Fatty liver. 4. Aortic Atherosclerosis (ICD10-I70.0) and Emphysema (ICD10-J43.9). Electronically Signed   By: AAnner CreteM.D.   On: 07/29/2022 00:55   DG Chest 2 View  Result Date: 07/28/2022 CLINICAL DATA:  Chest pain. EXAM: CHEST - 2 VIEW COMPARISON:  Chest radiograph dated 05/27/2022. FINDINGS: No focal consolidation, pleural effusion, or pneumothorax. Mild diffuse chronic interstitial coarsening. Stable cardiomediastinal silhouette. Atherosclerotic calcification of the aorta. Degenerative changes of the spine. No acute osseous pathology. IMPRESSION: No active cardiopulmonary disease. Electronically Signed   By: AAnner CreteM.D.   On: 07/28/2022 23:11   [place]  Result Date: 07/08/2022 CLINICAL DATA:  AAA screening EXAM: ULTRASOUND OF ABDOMINAL AORTA TECHNIQUE: Ultrasound examination of the abdominal aorta and proximal common iliac arteries was performed to evaluate for aneurysm. Additional color and Doppler images of the distal aorta were obtained to document patency. COMPARISON:  None Available. FINDINGS: Abdominal aortic measurements as follows: Proximal:  3.5 x 3.2 cm Mid:  2.2 x 1.7 cm Distal:  1.9 x 1.9 cm Abdominal aortic peak systolic velocity 371 cm/seconds Right common iliac artery: 1.5 x 1.5 cm Left common iliac artery: 1.5 x 1.4 cm IMPRESSION: Proximal abdominal aortic aneurysm with a diameter 3.5 cm. Two year follow up recommended. Electronically Signed   By: Sammie Bench M.D.   On: 07/08/2022 19:41   US Venous Img Lower Bilateral (DVT)  Result Date: 07/08/2022 CLINICAL DATA:  Bilateral leg pain EXAM: BILATERAL LOWER EXTREMITY VENOUS DOPPLER ULTRASOUND TECHNIQUE: Gray-scale sonography with graded compression, as well as color Doppler and duplex ultrasound were performed to evaluate the lower extremity deep venous systems from the level of the common femoral vein and including the common femoral, femoral, profunda femoral, popliteal and calf veins  including the posterior tibial, peroneal and gastrocnemius veins when visible. The superficial great saphenous vein was also interrogated. Spectral Doppler was utilized to evaluate flow at rest and with distal augmentation maneuvers in the common femoral, femoral and popliteal veins. COMPARISON:  None Available. FINDINGS: RIGHT LOWER EXTREMITY Common Femoral Vein: No evidence of thrombus. Normal compressibility, respiratory phasicity and response to augmentation. Saphenofemoral Junction: No evidence of thrombus. Normal compressibility and flow on color Doppler imaging. Profunda Femoral Vein: No evidence of thrombus. Normal compressibility and flow on color Doppler imaging. Femoral Vein: No evidence of thrombus. Normal compressibility, respiratory phasicity and response to augmentation. Popliteal Vein: The popliteal vein is not compressible. The lumen is filled and expanded with low-level internal echoes. No evidence of color flow on color Doppler imaging. Findings are consistent with occlusive DVT. Calf Veins: Occlusive DVT extends into the peroneal veins. Superficial Great Saphenous Vein: No evidence of thrombus. Normal compressibility. Venous Reflux:  None. Other Findings:  None. LEFT LOWER EXTREMITY Common Femoral Vein: No evidence of thrombus. Normal compressibility, respiratory phasicity and response to augmentation. Saphenofemoral Junction: No evidence of thrombus. Normal compressibility and flow on color Doppler imaging. Profunda Femoral Vein: No evidence of thrombus. Normal compressibility and flow on color Doppler imaging. Femoral Vein: No evidence of thrombus. Normal compressibility, respiratory phasicity and response to augmentation. Popliteal Vein: No evidence of thrombus. Normal compressibility, respiratory phasicity and response to augmentation. Calf Veins: No evidence of thrombus. Normal compressibility and flow on color Doppler imaging. Superficial Great Saphenous Vein: No evidence of thrombus.  Normal compressibility. Venous Reflux:  None. Other Findings:  None. IMPRESSION: 1. Positive for occlusive DVT in the right popliteal vein likely extending into the peroneal veins. 2. No evidence of left lower extremity DVT. Electronically Signed   By: Jacqulynn Cadet M.D.   On: 07/08/2022 12:21     Discharge Exam: Vitals:   07/29/22 0942 07/29/22 1009  BP: (!) 144/90 (!) 157/95  Pulse: 63 73  Resp: 18 20  Temp: 97.9 F (36.6 C) 97.9 F (36.6 C)  SpO2: 95% 95%   Vitals:   07/29/22 0801 07/29/22 0814 07/29/22 0942 07/29/22 1009  BP: (!) 121/102  (!) 144/90 (!) 157/95  Pulse:   63 73  Resp: '16  18 20  '$ Temp:   97.9 F (36.6 C) 97.9 F (36.6 C)  TempSrc:   Oral   SpO2: 100% 98% 95% 95%  Weight:      Height:        General: Pt is alert, awake, not in acute distress Cardiovascular: RRR, S1/S2 +, no rubs, no gallops Respiratory: CTA bilaterally, no wheezing, no rhonchi Abdominal: Soft, NT, ND, bowel sounds + Extremities: no edema, no cyanosis    The results of significant diagnostics from this hospitalization (  including imaging, microbiology, ancillary and laboratory) are listed below for reference.     Microbiology: Recent Results (from the past 240 hour(s))  Resp panel by RT-PCR (RSV, Flu A&B, Covid) Anterior Nasal Swab     Status: None   Collection Time: 07/29/22 12:21 AM   Specimen: Anterior Nasal Swab  Result Value Ref Range Status   SARS Coronavirus 2 by RT PCR NEGATIVE NEGATIVE Final    Comment: (NOTE) SARS-CoV-2 target nucleic acids are NOT DETECTED.  The SARS-CoV-2 RNA is generally detectable in upper respiratory specimens during the acute phase of infection. The lowest concentration of SARS-CoV-2 viral copies this assay can detect is 138 copies/mL. A negative result does not preclude SARS-Cov-2 infection and should not be used as the sole basis for treatment or other patient management decisions. A negative result may occur with  improper specimen  collection/handling, submission of specimen other than nasopharyngeal swab, presence of viral mutation(s) within the areas targeted by this assay, and inadequate number of viral copies(<138 copies/mL). A negative result must be combined with clinical observations, patient history, and epidemiological information. The expected result is Negative.  Fact Sheet for Patients:  EntrepreneurPulse.com.au  Fact Sheet for Healthcare Providers:  IncredibleEmployment.be  This test is no t yet approved or cleared by the [place] FDA and  has been authorized for detection and/or diagnosis of SARS-CoV-2 by FDA under an Emergency Use Authorization (EUA). This EUA will remain  in effect (meaning this test can be used) for the duration of the COVID-19 declaration under Section 564(b)(1) of the Act, 21 U.S.C.section 360bbb-3(b)(1), unless the authorization is terminated  or revoked sooner.       Influenza A by PCR NEGATIVE NEGATIVE Final   Influenza B by PCR NEGATIVE NEGATIVE Final    Comment: (NOTE) The Xpert Xpress SARS-CoV-2/FLU/RSV plus assay is intended as an aid in the diagnosis of influenza from Nasopharyngeal swab specimens and should not be used as a sole basis for treatment. Nasal washings and aspirates are unacceptable for Xpert Xpress SARS-CoV-2/FLU/RSV testing.  Fact Sheet for Patients: EntrepreneurPulse.com.au  Fact Sheet for Healthcare Providers: IncredibleEmployment.be  This test is not yet approved or cleared by the [place] FDA and has been authorized for detection and/or diagnosis of SARS-CoV-2 by FDA under an Emergency Use Authorization (EUA). This EUA will remain in effect (meaning this test can be used) for the duration of the COVID-19 declaration under Section 564(b)(1) of the Act, 21 U.S.C. section 360bbb-3(b)(1), unless the authorization is terminated or revoked.     Resp Syncytial  Virus by PCR NEGATIVE NEGATIVE Final    Comment: (NOTE) Fact Sheet for Patients: EntrepreneurPulse.com.au  Fact Sheet for Healthcare Providers: IncredibleEmployment.be  This test is not yet approved or cleared by the [place] FDA and has been authorized for detection and/or diagnosis of SARS-CoV-2 by FDA under an Emergency Use Authorization (EUA). This EUA will remain in effect (meaning this test can be used) for the duration of the COVID-19 declaration under Section 564(b)(1) of the Act, 21 U.S.C. section 360bbb-3(b)(1), unless the authorization is terminated or revoked.  Performed at [facility] [address] [place], [place] [address]      Labs: BNP (last 3 results) No results for input(s): "BNP" in the last 8760 hours. Basic Metabolic Panel: Recent Labs  Lab 07/28/22 2236 07/29/22 0543  NA 139 140  K 3.4* 4.2  CL 105 106  CO2 23 27  GLUCOSE 125* 131*  BUN 11 10  CREATININE 0.85 0.88  CALCIUM 9.3 9.4   Liver Function Tests: Recent Labs  Lab 07/29/22 0543  AST 75*  ALT 52*  ALKPHOS 81  BILITOT 0.7  PROT 6.9  ALBUMIN 3.7   No results for input(s): "LIPASE", "AMYLASE" in the last 168 hours. No results for input(s): "AMMONIA" in the last 168 hours. CBC: Recent Labs  Lab 07/28/22 2236  WBC 9.4  HGB 14.6  HCT 43.0  MCV 97.7  PLT 141*   Cardiac Enzymes: No results for input(s): "CKTOTAL", "CKMB", "CKMBINDEX", "TROPONINI" in the last 168 hours. BNP: Invalid input(s): "POCBNP" CBG: No results for input(s): "GLUCAP" in the last 168 hours. D-Dimer No results for input(s): "DDIMER" in the last 72 hours. Hgb A1c No results for input(s): "HGBA1C" in the last 72 hours. Lipid Profile No results for input(s): "CHOL", "HDL", "LDLCALC", "TRIG", "CHOLHDL", "LDLDIRECT" in the last 72 hours. Thyroid function studies No results for input(s): "TSH", "T4TOTAL", "T3FREE", "THYROIDAB" in the last 72 hours.  Invalid  input(s): "FREET3" Anemia work up No results for input(s): "VITAMINB12", "FOLATE", "FERRITIN", "TIBC", "IRON", "RETICCTPCT" in the last 72 hours. Urinalysis    Component Value Date/Time   COLORURINE STRAW (A) 09/23/2021 1925   APPEARANCEUR Clear 12/10/2021 1557   LABSPEC 1.003 (L) 09/23/2021 1925   PHURINE 6.0 09/23/2021 1925   GLUCOSEU Negative 12/10/2021 1557   HGBUR NEGATIVE 09/23/2021 1925   BILIRUBINUR Negative 12/10/2021 [place] 09/23/2021 1925   PROTEINUR 2+ (A) 12/10/2021 1557   PROTEINUR NEGATIVE 09/23/2021 1925   UROBILINOGEN negative (A) 01/17/2020 1422   UROBILINOGEN 0.2 01/30/2012 0120   NITRITE Negative 12/10/2021 1557   NITRITE NEGATIVE 09/23/2021 1925   LEUKOCYTESUR Negative 12/10/2021 [place] 09/23/2021 1925   Sepsis Labs Recent Labs  Lab 07/28/22 2236  WBC 9.4   Microbiology Recent Results (from the past 240 hour(s))  Resp panel by RT-PCR (RSV, Flu A&B, Covid) Anterior Nasal Swab     Status: None   Collection Time: 07/29/22 12:21 AM   Specimen: Anterior Nasal Swab  Result Value Ref Range Status   SARS Coronavirus 2 by RT PCR NEGATIVE NEGATIVE Final    Comment: (NOTE) SARS-CoV-2 target nucleic acids are NOT DETECTED.  The SARS-CoV-2 RNA is generally detectable in upper respiratory specimens during the acute phase of infection. The lowest concentration of SARS-CoV-2 viral copies this assay can detect is 138 copies/mL. A negative result does not preclude SARS-Cov-2 infection and should not be used as the sole basis for treatment or other patient management decisions. A negative result may occur with  improper specimen collection/handling, submission of specimen other than nasopharyngeal swab, presence of viral mutation(s) within the areas targeted by this assay, and inadequate number of viral copies(<138 copies/mL). A negative result must be combined with clinical observations, patient history, and  epidemiological information. The expected result is Negative.  Fact Sheet for Patients:  EntrepreneurPulse.com.au  Fact Sheet for Healthcare Providers:  IncredibleEmployment.be  This test is no t yet approved or cleared by the [place] FDA and  has been authorized for detection and/or diagnosis of SARS-CoV-2 by FDA under an Emergency Use Authorization (EUA). This EUA will remain  in effect (meaning this test can be used) for the duration of the COVID-19 declaration under Section 564(b)(1) of the Act, 21 U.S.C.section 360bbb-3(b)(1), unless the authorization is terminated  or revoked sooner.       Influenza A by PCR NEGATIVE NEGATIVE Final   Influenza B by PCR NEGATIVE NEGATIVE Final  Comment: (NOTE) The Xpert Xpress SARS-CoV-2/FLU/RSV plus assay is intended as an aid in the diagnosis of influenza from Nasopharyngeal swab specimens and should not be used as a sole basis for treatment. Nasal washings and aspirates are unacceptable for Xpert Xpress SARS-CoV-2/FLU/RSV testing.  Fact Sheet for Patients: EntrepreneurPulse.com.au  Fact Sheet for Healthcare Providers: IncredibleEmployment.be  This test is not yet approved or cleared by the [place] FDA and has been authorized for detection and/or diagnosis of SARS-CoV-2 by FDA under an Emergency Use Authorization (EUA). This EUA will remain in effect (meaning this test can be used) for the duration of the COVID-19 declaration under Section 564(b)(1) of the Act, 21 U.S.C. section 360bbb-3(b)(1), unless the authorization is terminated or revoked.     Resp Syncytial Virus by PCR NEGATIVE NEGATIVE Final    Comment: (NOTE) Fact Sheet for Patients: EntrepreneurPulse.com.au  Fact Sheet for Healthcare Providers: IncredibleEmployment.be  This test is not yet approved or cleared by the [place] FDA and has been  authorized for detection and/or diagnosis of SARS-CoV-2 by FDA under an Emergency Use Authorization (EUA). This EUA will remain in effect (meaning this test can be used) for the duration of the COVID-19 declaration under Section 564(b)(1) of the Act, 21 U.S.C. section 360bbb-3(b)(1), unless the authorization is terminated or revoked.  Performed at [facility], [address], [place], [place] [address]      Time coordinating discharge: 35 minutes  SIGNED:   Rodena Goldmann, DO [facility] 07/29/2022, 1:58 PM  If 7PM-7AM, please contact night-coverage [email or web address]

## 2022-07-29 NOTE — Progress Notes (Signed)
ANTICOAGULATION CONSULT NOTE - Initial Consult  Pharmacy Consult for warfarin/lovenox bridge Indication:  hx DVT/PE  No Known Allergies  Patient Measurements: Height: '6\' 2"'$  (188 cm) Weight: 108.9 kg (240 lb) IBW/kg (Calculated) : 82.2 Heparin Dosing Weight:   Vital Signs: Temp: 97.9 F (36.6 C) (12/26 1009) Temp Source: Oral (12/26 0942) BP: 157/95 (12/26 1009) Pulse Rate: 73 (12/26 1009)  Labs: Recent Labs    07/28/22 2233 07/28/22 2236 07/29/22 0004 07/29/22 0543  HGB  --  14.6  --   --   HCT  --  43.0  --   --   PLT  --  141*  --   --   LABPROT 19.5*  --   --  18.7*  INR 1.7*  --   --  1.6*  CREATININE  --  0.85  --  0.88  TROPONINIHS  --  '12 13 11    '$ Estimated Creatinine Clearance: 110 mL/min (by C-G formula based on SCr of 0.88 mg/dL).   Medical History: Past Medical History:  Diagnosis Date   Bilateral shoulder bursitis    Chronic back pain    COPD (chronic obstructive pulmonary disease) with emphysema ([name])    DDD (degenerative disc disease), lumbar    hx epideral injection  L5--S1    Diverticulitis    DVT (deep venous thrombosis) ([place]) 11/19/2018   Dysphasia    intermittant w/ food    Dyspnea    Dyspnea on exertion    Fatty liver    GERD (gastroesophageal reflux disease)    Gout    L great toe   Heterozygous factor V Leiden mutation ([place])    Hiatal hernia    Hip problem 2020   left, seeing orthopedics   History of acute pancreatitis    alcoholic pancreatitis [id number] and 01-30-2012. 2021   History of colon polyps    10-17-2005  hyperplastic polyp's   History of esophageal stricture    s/p  dilation 02-02-2017   History of peptic ulcer 1990s   History of pneumococcal pneumonia 2006   bilateral pneumonia w/ left lower lobe abscess treated w/ chest tube with suction for drainage   History of pneumothorax    1984--  left spontaneous pneumothorax ,  treated w/ chest tube   Hypertension    Malignant neoplasm of prostate [facility]) urologist-  dr  dahlstedt/  oncologist -- dr Tammi Klippel   dx 12-02-2016-- Stage T2b, Gleason 3+4,  PSA 5.99,  vol 25cc   Neuropathy    Numbness of left foot    outer left ankle numb-- per pt has appt. w/ neurologist   OSA (obstructive sleep apnea)    per pt has not used cpap over year ago from 04-16-2017--- per study 01-17-2010  severe osa   Pneumonia    Solid nodule of lung greater than 8 mm in diameter 05/11/2018   03/2018: RUL Nodule, 54m   Wears glasses     Medications:  Medications Prior to Admission  Medication Sig Dispense Refill Last Dose   acetaminophen (TYLENOL) 500 MG tablet Take 2 tablets (1,000 mg total) by mouth every 6 (six) hours as needed for mild pain. 30 tablet 0 unk   albuterol (PROVENTIL) (2.5 MG/3ML) 0.083% nebulizer solution Take 2.5 mg by nebulization every 6 (six) hours as needed for wheezing or shortness of breath.   07/28/2022   albuterol (VENTOLIN HFA) 108 (90 Base) MCG/ACT inhaler Inhale 2 puffs into the lungs every 6 (six) hours as needed for wheezing or shortness of  breath.   unk   allopurinol (ZYLOPRIM) 300 MG tablet Take 300 mg by mouth daily.   07/28/2022   doxycycline (VIBRAMYCIN) 50 MG capsule Take 50 mg by mouth daily.   07/28/2022   pantoprazole (PROTONIX) 40 MG tablet Take 1 tablet (40 mg total) by mouth daily. 30 tablet 11 07/28/2022   rosuvastatin (CRESTOR) 20 MG tablet TAKE 1 TABLET(20 MG) BY MOUTH DAILY 90 tablet 3 Past Week   TRELEGY ELLIPTA 100-62.5-25 MCG/ACT AEPB INHALE 1 PUFF INTO THE LUNGS DAILY 60 each 5 07/28/2022   warfarin (COUMADIN) 2 MG tablet Take 1 tablet (2 mg total) by mouth daily. (Patient taking differently: Take 1 mg by mouth daily.) 90 tablet 3 07/28/2022 at am   NON FORMULARY Pt uses a cpap nightly       Assessment: Pharmacy consulted to dose warfarin in patient with history of DVT/PE.  He is also being bridged with lovenox. Last warfarin dose patient was listed on was 2 mg daily startin 12/24 after holding the previous two days  (supratherapeutic INR) per outpatient MD.  INR 1.6 CBC WNL  Goal of Therapy:  INR 2-3 Monitor platelets by anticoagulation protocol: Yes   Plan:  Lovenox 110 mg subq every 12 hours until INR >2 Warfarin 2 mg x 1 dose. Monitor daily INR and s/s of bleeding.  Margot Ables, PharmD Clinical Pharmacist 07/29/2022 12:44 PM

## 2022-07-29 NOTE — Assessment & Plan Note (Signed)
-   Factor V Leiden mutation - INR currently 1.6 - Continue 2 mg of warfarin per outpatient prescription - Bridged with Lovenox shots - Patient does have history of DVT and PE - He also has a history of DVT while on Eliquis - Continue to bridge warfarin

## 2022-07-29 NOTE — Assessment & Plan Note (Signed)
-   Potassium 3.4 - Potassium 20 mEq replaced - Trend in the a.m.

## 2022-07-29 NOTE — Assessment & Plan Note (Deleted)
-   Denies current alcohol abuse - Reports that he does not have a drink every day - Will not quantify how often he does have a drink - Low threshold for CIWA protocol to be started

## 2022-07-29 NOTE — Assessment & Plan Note (Signed)
-   Continue to monitor, no home antihypertensives on med list

## 2022-07-29 NOTE — H&P (Addendum)
History and Physical    Patient: [name] XBW:[id number] DOB: [birth year] DOA: 07/28/2022 DOS: the patient was seen and examined on 07/29/2022 PCP: [name], MD  Patient coming from: Home  Chief Complaint:  Chief Complaint  Patient presents with   Chest Pain   HPI: [name] is a 66 y.o. male with medical history significant of COPD, DVT, PE, factor V Leyden mutation, GERD, hypertension, OSA, and more presents the ED with a chief complaint of chest pain.  It was mild at first.  It became more moderate as the day went on.  It started around noon on Christmas Day.  He points to the substernal and epigastric region of his chest.  He reports it is on both sides of his chest radiating out from the sternum, but not quite to the midclavicular line.  Is worse with a deep breath.  Nothing makes it better.  He reports that fentanyl did help take the edge off, but did not relieve the pain.  He describes the pain as a constant pressure.  When he takes a deep breath it becomes sharp.  He has had some nausea with exertion.  He has had worsening dyspnea with exertion.  Patient denies diaphoresis.  Patient has had pulmonary embolism in the past and reports this does not feel like that.  He has had no near or syncopal events.  He reports that his belly does feel tight and that started at the same time yesterday.  Lastly he reports that he had an headache develop since he has been in the ER.  He has no change in vision, no tinnitus, no asymmetric weakness.  Patient has no other complaints at this time.  Patient reports he does not smoke, does not drink, does not use illicit drugs.  He is vaccinated for COVID.  Patient is full code. Review of Systems: As mentioned in the history of present illness. All other systems reviewed and are negative. Past Medical History:  Diagnosis Date   Bilateral shoulder bursitis    Chronic back pain    COPD (chronic obstructive pulmonary disease) with emphysema  ([name])    DDD (degenerative disc disease), lumbar    hx epideral injection  L5--S1    Diverticulitis    DVT (deep venous thrombosis) ([place]) 11/19/2018   Dysphasia    intermittant w/ food    Dyspnea    Dyspnea on exertion    Fatty liver    GERD (gastroesophageal reflux disease)    Gout    L great toe   Heterozygous factor V Leiden mutation ([place])    Hiatal hernia    Hip problem 2020   left, seeing orthopedics   History of acute pancreatitis    alcoholic pancreatitis [id number] and 01-30-2012. 2021   History of colon polyps    10-17-2005  hyperplastic polyp's   History of esophageal stricture    s/p  dilation 02-02-2017   History of peptic ulcer 1990s   History of pneumococcal pneumonia 2006   bilateral pneumonia w/ left lower lobe abscess treated w/ chest tube with suction for drainage   History of pneumothorax    1984--  left spontaneous pneumothorax ,  treated w/ chest tube   Hypertension    Malignant neoplasm of prostate [facility]) urologist-  dr dahlstedt/  oncologist -- dr Tammi Klippel   dx 12-02-2016-- Stage T2b, Gleason 3+4,  PSA 5.99,  vol 25cc   Neuropathy    Numbness of left foot    outer left  ankle numb-- per pt has appt. w/ neurologist   OSA (obstructive sleep apnea)    per pt has not used cpap over year ago from 04-16-2017--- per study 01-17-2010  severe osa   Pneumonia    Solid nodule of lung greater than 8 mm in diameter 05/11/2018   03/2018: RUL Nodule, 95m   Wears glasses    Past Surgical History:  Procedure Laterality Date   BIOPSY  02/02/2017   Procedure: BIOPSY;  Surgeon: RDaneil Dolin MD;  [facility];  Service: Endoscopy;;  duodenal bx's, esophgeal bx's   BIOPSY  07/30/2017   Procedure: BIOPSY;  Surgeon: RDaneil Dolin MD;  [facility];  Service: Endoscopy;;  esophagus   BIOPSY  02/15/2021   Procedure: BIOPSY;  Surgeon: RDaneil Dolin MD;  [facility];  Service: Endoscopy;;   CARDIOVASCULAR STRESS TEST  09/12/2009    normal nuclear perfusion study w/ no ischemia /  normal LV function and wall motion , ef 57%   COLONOSCOPY  2007   Dr. RGala Romney hyperplastic polyps, internal hemorrhoids    COLONOSCOPY WITH PROPOFOL N/A 05/03/2018   Dr. RGala Romney diverticulosis in entire colon, multiple polyps in sigmoid, at splenic flexure, and ascending colon, not all polyps removed. Tubular adenomas and inflammatory polyps. Needs 3 year surveilance   COLONOSCOPY WITH PROPOFOL N/A 02/15/2021   Many polyps without adenomatous changes but all inflammatory in nature with mildly active colitis and chronic inflammatory mucosa.   EPIDURAL BLOCK INJECTION  2020   ESOPHAGOGASTRODUODENOSCOPY (EGD) WITH PROPOFOL N/A 02/02/2017   Dr. RGala Romney esophageal stenosis s/p dilation and biopsy, medium sized hiatal hernia, normal duodenum   ESOPHAGOGASTRODUODENOSCOPY (EGD) WITH PROPOFOL N/A 07/30/2017   Dr. RGala Romney esophageal stenosis s/p dilation and biopsy, medium-sized hiatal hernia, normal duodenum   HEMORRHOID SURGERY  01-21-2008    AForestine Na  HIP ARTHROPLASTY Bilateral    INTRAVASCULAR PRESSURE WIRE/FFR STUDY N/A 08/01/2021   Procedure: INTRAVASCULAR PRESSURE WIRE/FFR STUDY;  Surgeon: MBurnell Blanks MD;  [facility];  Service: Cardiovascular;  Laterality: N/A;   IR RADIOLOGIST EVAL & MGMT  08/29/2020   LEFT HEART CATH AND CORONARY ANGIOGRAPHY N/A 08/01/2021   Procedure: LEFT HEART CATH AND CORONARY ANGIOGRAPHY;  Surgeon: MBurnell Blanks MD;  [facility];  Service: Cardiovascular;  Laterality: N/A;   MALONEY DILATION N/A 02/02/2017   Procedure: MVenia MinksDILATION;  Surgeon: RDaneil Dolin MD;  [facility];  Service: Endoscopy;  Laterality: N/A;   MALONEY DILATION N/A 07/30/2017   Procedure: MVenia MinksDILATION;  Surgeon: RDaneil Dolin MD;  [facility];  Service: Endoscopy;  Laterality: N/A;   POLYPECTOMY  05/03/2018   Procedure: POLYPECTOMY;  Surgeon: RDaneil Dolin  MD;  [facility];  Service: Endoscopy;;  ascending colon polyp, sigmoid colon polyps (multiple)   POLYPECTOMY  02/15/2021   Procedure: POLYPECTOMY;  Surgeon: RDaneil Dolin MD;  [facility];  Service: Endoscopy;;   RADIOACTIVE SEED IMPLANT N/A 04/23/2017   Procedure: RADIOACTIVE SEED IMPLANT/BRACHYTHERAPY IMPLANT;  Surgeon: DFranchot Gallo MD;  [facility]  Service: Urology;  Laterality: N/A;   SHOULDER ARTHROSCOPY  08/05/2003   SPACE OAR INSTILLATION N/A 04/23/2017   Procedure: SPACE OAR INSTILLATION;  Surgeon: DFranchot Gallo MD;  [facility]  Service: Urology;  Laterality: N/A;   TRANSTHORACIC ECHOCARDIOGRAM  03/28/2009   mild LVH, ef 55-60%/ mild aorta calcification   VASECTOMY  08/05/1983   VIDEO ASSISTED THORACOSCOPY (VATS)/DECORTICATION Left    Social History:  reports that he quit smoking about 6 years ago. His smoking use included cigarettes and e-cigarettes. He has a 30.00 pack-year smoking history. He has been exposed to tobacco smoke. He has never used smokeless tobacco. He reports current alcohol use of about 5.0 standard drinks of alcohol per week. He reports that he does not use drugs.  No Known Allergies  Family History  Problem Relation Age of Onset   Diabetes Mother    Heart disease Father    Heart disease Brother    Cancer Neg Hx    Colon cancer Neg Hx    Colon polyps Neg Hx    Neuropathy Neg Hx     Prior to Admission medications   Medication Sig Start Date End Date Taking? Authorizing Provider  acetaminophen (TYLENOL) 500 MG tablet Take 2 tablets (1,000 mg total) by mouth every 6 (six) hours as needed for mild pain. 08/21/20   Meuth, Brooke A, PA-C  albuterol (PROVENTIL) (2.5 MG/3ML) 0.083% nebulizer solution Take 2.5 mg by nebulization every 6 (six) hours as needed for wheezing or shortness of breath.    [provider]  albuterol (VENTOLIN HFA) 108 (90 Base) MCG/ACT  inhaler Inhale 2 puffs into the lungs every 6 (six) hours as needed for wheezing or shortness of breath.    [provider]  allopurinol (ZYLOPRIM) 300 MG tablet Take 300 mg by mouth daily. 09/17/20   [provider]  doxycycline (VIBRAMYCIN) 50 MG capsule Take 50 mg by mouth daily. 01/15/22   [provider]  NON FORMULARY Pt uses a cpap nightly    [provider]  pantoprazole (PROTONIX) 40 MG tablet Take 1 tablet (40 mg total) by mouth daily. 04/29/22 07/24/22  Daneil Dolin, MD  rosuvastatin (CRESTOR) 20 MG tablet TAKE 1 TABLET(20 MG) BY MOUTH DAILY 04/22/22   Richardo Priest, MD  TRELEGY ELLIPTA 100-62.5-25 MCG/ACT AEPB INHALE 1 PUFF INTO THE LUNGS DAILY 07/08/22   Icard, Octavio Graves, DO  warfarin (COUMADIN) 2 MG tablet Take 1 tablet (2 mg total) by mouth daily. 07/25/22   Mallipeddi, Quenten Raven, MD    Physical Exam: Vitals:   07/29/22 0400 07/29/22 0430 07/29/22 0600 07/29/22 0613  BP: 126/82 128/88 139/86   Pulse: 71 70 74   Resp: '18 18 18   '$ Temp:    98 F (36.7 C)  TempSrc:    Oral  SpO2: 95% 97% 95%   Weight:      Height:       1.  General: Patient lying supine in bed,  no acute distress   2. Psychiatric: Alert and oriented x 3, mood and behavior normal for situation, pleasant and cooperative with exam   3. Neurologic: Speech and language are normal, face is symmetric, moves all 4 extremities voluntarily, at baseline without acute deficits on limited exam   4. HEENMT:  Head is atraumatic, normocephalic, pupils reactive to light, neck is supple, trachea is midline, mucous membranes are moist   5. Respiratory : Lungs are clear to auscultation bilaterally without wheezing, rhonchi, rales, no cyanosis, no increase in work of breathing or accessory muscle use   6. Cardiovascular : Heart rate normal, rhythm is regular, no murmurs, rubs or gallops, no peripheral edema, peripheral pulses palpated, venous stasis changes in the bilateral lower  extremities   7. Gastrointestinal:  Abdomen is soft, nondistended, nontender to palpation bowel sounds active, no masses or  organomegaly palpated   8. Skin:  Venous stasis changes in the bilateral lower extremities   9.Musculoskeletal:  No acute deformities or trauma, no asymmetry in tone, no peripheral edema, peripheral pulses palpated, no tenderness to palpation in the extremities  Data Reviewed: In the ED Temp 98.1-98.2, heart rate 63-77, respiratory rate 14-22, blood pressure 138/90-178/107, satting at 94% No leukocytosis with white blood cell count of 9.4, hemoglobin 14.6, platelets 141 Chemistry reveals a slight hypokalemia at 3.4 Troponin 12, 13 Negative COVID and flu CTA shows no acute PE.  Advanced three-vessel coronary vascular disease present Chest x-ray shows no acute cardiopulmonary disease Admission requested for chest pain evaluation  Assessment and Plan: * Chest pain - Known coronary artery disease - Troponin normal 12, 13 - CTA shows advanced three-vessel coronary vascular disease without PE - Chest x-ray shows no acute cardiopulmonary disease - No reported trauma - Admission for consult with cardiology, possibly stress test?? - Continue aspirin, Crestor - Continue to monitor  Subtherapeutic international normalized ratio (INR) - Factor V Leiden mutation - INR currently 1.6 - Continue 2 mg of warfarin per outpatient prescription - Bridged with Lovenox shots - Patient does have history of DVT and PE - He also has a history of DVT while on Eliquis - Continue to bridge warfarin  HTN (hypertension) - Continue to monitor, no home antihypertensives on med list  Factor V Leiden mutation ([place]) - Continue bridging to warfarin with Lovenox  Mixed hyperlipidemia - Continue statin  Hypokalemia - Potassium 3.4 - Potassium 20 mEq replaced - Trend in the a.m.  Acute respiratory failure with hypoxia ([name]) - Patient is requiring high flow nasal cannula to  maintain oxygen sats - Patient refused BiPAP - Multifactorial, with A-fib RVR  Coronary artery disease involving native coronary artery of native heart without angina pectoris - Again demonstrated on CTA - Continue aspirin, statin - Cardiology consult  GERD (gastroesophageal reflux disease) - Continue Protonix      Advance Care Planning:   Code Status: Full Code  Consults: Cardiology  Family Communication: No family at bedside  Severity of Illness: The appropriate patient status for this patient is OBSERVATION. Observation status is judged to be reasonable and necessary in order to provide the required intensity of service to ensure the patient's safety. The patient's presenting symptoms, physical exam findings, and initial radiographic and laboratory data in the context of their medical condition is felt to place them at decreased risk for further clinical deterioration. Furthermore, it is anticipated that the patient will be medically stable for discharge from the hospital within 2 midnights of admission.   Author: Rolla Plate, DO 07/29/2022 6:20 AM  For on call review [email or web address].

## 2022-07-29 NOTE — Assessment & Plan Note (Signed)
-   Again demonstrated on CTA - Continue aspirin, statin - Cardiology consult

## 2022-07-29 NOTE — Assessment & Plan Note (Signed)
-   Continue bridging to warfarin with Lovenox

## 2022-07-29 NOTE — ED Notes (Signed)
Patient transported to CT 

## 2022-07-29 NOTE — Assessment & Plan Note (Deleted)
-   Smokes more than a pack a day - Counseled on importance of cessation especially in the setting of COPD - Nicotine patch ordered

## 2022-07-31 ENCOUNTER — Telehealth: Payer: Self-pay

## 2022-07-31 ENCOUNTER — Other Ambulatory Visit (HOSPITAL_COMMUNITY)
Admission: RE | Admit: 2022-07-31 | Discharge: 2022-07-31 | Disposition: A | Discharge status: Home / Self Care | Payer: PPO | Source: Ambulatory Visit | Attending: Internal Medicine | Admitting: Internal Medicine

## 2022-07-31 ENCOUNTER — Encounter: Payer: Self-pay | Admitting: Internal Medicine

## 2022-07-31 ENCOUNTER — Telehealth: Payer: Self-pay | Admitting: Internal Medicine

## 2022-07-31 DIAGNOSIS — Z5181 Encounter for therapeutic drug level monitoring: Secondary | ICD-10-CM | POA: Diagnosis present

## 2022-07-31 DIAGNOSIS — Z7901 Long term (current) use of anticoagulants: Secondary | ICD-10-CM | POA: Diagnosis present

## 2022-07-31 LAB — PROTIME-INR
INR: 1.3 — ABNORMAL HIGH (ref 0.8–1.2)
Prothrombin Time: 15.9 seconds — ABNORMAL HIGH (ref 11.4–15.2)

## 2022-07-31 NOTE — Telephone Encounter (Signed)
Patient was seen in the ED on 12/25-12/26 for CP. Pt stated while there, PT-INR was completed. Level was 1.6, down from 4.6 1 week ago. Per providers instructions pt was supposed to hold coumadin for 2 days, have INR rechecked and restart coumadin at 2 mg if INR was at an acceptable level. Pt restarted Coumadin at 1 mg daily. Pt is leaving out of town on 12/29 and will not be back until Jan. 1st. Patient would like to know if he needs another INR check before he leaves.   Please advise.

## 2022-07-31 NOTE — Telephone Encounter (Signed)
Spoke to Dr. Dellia Cloud who agreed that patient should have INR rechecked prior to leaving out of town. Will call pt and verbalize order for INR check.

## 2022-07-31 NOTE — Telephone Encounter (Signed)
-----   Message from Chalmers Guest, MD sent at 07/31/2022  4:05 PM EST ----- INR is sub-therapeutic. Instruct patient to take recommended dose of 2 mg daily (instead of 1 mg). F/u Coumadin clinic on 08/07/22 for INR.

## 2022-07-31 NOTE — Telephone Encounter (Signed)
Patient notified and verbalized understanding. Patient will come to [facility] lab to have PT/INR rechecked today. Patient had no further questions or concerns at this time.

## 2022-07-31 NOTE — Telephone Encounter (Signed)
Patient stated he went to the ED on 12/25 and the did lab work.  Patient stated he will be going out of town and wanted to check if he needs to do anything else regarding a warfarin check.

## 2022-07-31 NOTE — Telephone Encounter (Signed)
Patient notified and verbalized understanding. Patient had no questions or concerns at this time. PCP copied 

## 2022-08-07 ENCOUNTER — Ambulatory Visit: Payer: PPO | Attending: Internal Medicine | Admitting: *Deleted

## 2022-08-07 DIAGNOSIS — D6851 Activated protein C resistance: Secondary | ICD-10-CM | POA: Diagnosis not present

## 2022-08-07 DIAGNOSIS — Z7901 Long term (current) use of anticoagulants: Secondary | ICD-10-CM | POA: Diagnosis not present

## 2022-08-07 DIAGNOSIS — I2609 Other pulmonary embolism with acute cor pulmonale: Secondary | ICD-10-CM | POA: Diagnosis not present

## 2022-08-07 DIAGNOSIS — Z5181 Encounter for therapeutic drug level monitoring: Secondary | ICD-10-CM | POA: Diagnosis not present

## 2022-08-07 DIAGNOSIS — I82433 Acute embolism and thrombosis of popliteal vein, bilateral: Secondary | ICD-10-CM

## 2022-08-07 LAB — POCT INR: INR: 1.2 — AB (ref 2.0–3.0)

## 2022-08-07 NOTE — Patient Instructions (Signed)
Has been taking warfarin '2mg'$  daily since 07/31/22 Increase warfarin to 2 tablets ('4mg'$ ) daily and recheck INR in 1 week

## 2022-08-12 ENCOUNTER — Ambulatory Visit: Payer: PPO | Attending: Internal Medicine | Admitting: *Deleted

## 2022-08-12 DIAGNOSIS — I2609 Other pulmonary embolism with acute cor pulmonale: Secondary | ICD-10-CM | POA: Diagnosis not present

## 2022-08-12 DIAGNOSIS — Z7901 Long term (current) use of anticoagulants: Secondary | ICD-10-CM

## 2022-08-12 DIAGNOSIS — I82433 Acute embolism and thrombosis of popliteal vein, bilateral: Secondary | ICD-10-CM | POA: Diagnosis not present

## 2022-08-12 DIAGNOSIS — Z5181 Encounter for therapeutic drug level monitoring: Secondary | ICD-10-CM | POA: Diagnosis not present

## 2022-08-12 LAB — POCT INR: INR: 1.7 — AB (ref 2.0–3.0)

## 2022-08-13 NOTE — Patient Instructions (Signed)
Take warfarin 3 tablets ('6mg'$ ) tonight then continue 2 tablets ('4mg'$ ) daily and recheck INR in 1 week

## 2022-08-18 ENCOUNTER — Ambulatory Visit (HOSPITAL_COMMUNITY)
Admission: RE | Admit: 2022-08-18 | Discharge: 2022-08-18 | Disposition: A | Discharge status: Home / Self Care | Payer: PPO | Source: Ambulatory Visit | Attending: Family Medicine | Admitting: Family Medicine

## 2022-08-18 DIAGNOSIS — Z87891 Personal history of nicotine dependence: Secondary | ICD-10-CM

## 2022-08-18 DIAGNOSIS — Z122 Encounter for screening for malignant neoplasm of respiratory organs: Secondary | ICD-10-CM | POA: Diagnosis present

## 2022-08-19 ENCOUNTER — Ambulatory Visit: Payer: PPO | Attending: Internal Medicine | Admitting: *Deleted

## 2022-08-19 DIAGNOSIS — I82433 Acute embolism and thrombosis of popliteal vein, bilateral: Secondary | ICD-10-CM | POA: Diagnosis not present

## 2022-08-19 DIAGNOSIS — Z7901 Long term (current) use of anticoagulants: Secondary | ICD-10-CM

## 2022-08-19 DIAGNOSIS — Z5181 Encounter for therapeutic drug level monitoring: Secondary | ICD-10-CM | POA: Diagnosis not present

## 2022-08-19 DIAGNOSIS — I2609 Other pulmonary embolism with acute cor pulmonale: Secondary | ICD-10-CM | POA: Diagnosis not present

## 2022-08-19 LAB — POCT INR: INR: 3 (ref 2.0–3.0)

## 2022-08-19 NOTE — Patient Instructions (Addendum)
Take warfarin 2 tablets ('4mg'$ ) daily except 1 tablet ('2mg'$  ) on Tuesday and Fridays until INR check on Monday 1/22. On Ceftin for cellulitis of Lt leg

## 2022-08-20 ENCOUNTER — Other Ambulatory Visit: Payer: Self-pay

## 2022-08-20 DIAGNOSIS — Z87891 Personal history of nicotine dependence: Secondary | ICD-10-CM

## 2022-08-20 DIAGNOSIS — Z122 Encounter for screening for malignant neoplasm of respiratory organs: Secondary | ICD-10-CM

## 2022-08-25 ENCOUNTER — Ambulatory Visit: Payer: PPO | Attending: Internal Medicine | Admitting: *Deleted

## 2022-08-25 DIAGNOSIS — Z5181 Encounter for therapeutic drug level monitoring: Secondary | ICD-10-CM | POA: Diagnosis not present

## 2022-08-25 DIAGNOSIS — I2609 Other pulmonary embolism with acute cor pulmonale: Secondary | ICD-10-CM

## 2022-08-25 DIAGNOSIS — Z7901 Long term (current) use of anticoagulants: Secondary | ICD-10-CM | POA: Diagnosis not present

## 2022-08-25 DIAGNOSIS — I82433 Acute embolism and thrombosis of popliteal vein, bilateral: Secondary | ICD-10-CM | POA: Diagnosis not present

## 2022-08-25 LAB — POCT INR: INR: 2.8 (ref 2.0–3.0)

## 2022-08-25 NOTE — Patient Instructions (Signed)
Continue warfarin 2 tablets ('4mg'$ ) daily except 1 tablet ('2mg'$  ) on Tuesday and Fridays until INR check on Monday 1/29.

## 2022-08-31 ENCOUNTER — Other Ambulatory Visit: Payer: Self-pay | Admitting: Cardiology

## 2022-09-01 ENCOUNTER — Ambulatory Visit: Payer: PPO | Attending: Internal Medicine | Admitting: *Deleted

## 2022-09-01 DIAGNOSIS — I82433 Acute embolism and thrombosis of popliteal vein, bilateral: Secondary | ICD-10-CM

## 2022-09-01 DIAGNOSIS — Z5181 Encounter for therapeutic drug level monitoring: Secondary | ICD-10-CM | POA: Diagnosis not present

## 2022-09-01 DIAGNOSIS — I2609 Other pulmonary embolism with acute cor pulmonale: Secondary | ICD-10-CM

## 2022-09-01 DIAGNOSIS — Z7901 Long term (current) use of anticoagulants: Secondary | ICD-10-CM

## 2022-09-01 LAB — POCT INR: INR: 5 — AB (ref 2.0–3.0)

## 2022-09-01 NOTE — Patient Instructions (Signed)
Hold warfarin tonight and tomorrow night then decrease dose to 1 tablet daily except 2 tablets on Sundays, Tuesdays and Thursdays. Recheck in 1 week

## 2022-09-01 NOTE — Telephone Encounter (Signed)
Denied refill for metoprolol, not on patients medication list. Medication was marked d/c 04/22/22

## 2022-09-04 ENCOUNTER — Encounter: Payer: Self-pay | Admitting: Gastroenterology

## 2022-09-08 ENCOUNTER — Ambulatory Visit: Payer: PPO | Attending: Internal Medicine | Admitting: *Deleted

## 2022-09-08 DIAGNOSIS — Z5181 Encounter for therapeutic drug level monitoring: Secondary | ICD-10-CM

## 2022-09-08 DIAGNOSIS — Z7901 Long term (current) use of anticoagulants: Secondary | ICD-10-CM | POA: Diagnosis not present

## 2022-09-08 DIAGNOSIS — I2609 Other pulmonary embolism with acute cor pulmonale: Secondary | ICD-10-CM | POA: Diagnosis not present

## 2022-09-08 DIAGNOSIS — I82433 Acute embolism and thrombosis of popliteal vein, bilateral: Secondary | ICD-10-CM | POA: Diagnosis not present

## 2022-09-08 LAB — POCT INR: INR: 5.2 — AB (ref 2.0–3.0)

## 2022-09-08 NOTE — Patient Instructions (Addendum)
Hold warfarin tonight and tomorrow night then decrease dose to 1 tablet daily except 1 1/2 tablets on Sundays. Recheck in 1 week.  Denies S/S of bleeding.  Bleeding and fall precautions discussed with pt and he verbalized understanding.

## 2022-09-15 ENCOUNTER — Ambulatory Visit: Payer: PPO | Attending: Internal Medicine | Admitting: *Deleted

## 2022-09-15 DIAGNOSIS — I82433 Acute embolism and thrombosis of popliteal vein, bilateral: Secondary | ICD-10-CM | POA: Diagnosis not present

## 2022-09-15 DIAGNOSIS — Z5181 Encounter for therapeutic drug level monitoring: Secondary | ICD-10-CM

## 2022-09-15 DIAGNOSIS — I2609 Other pulmonary embolism with acute cor pulmonale: Secondary | ICD-10-CM

## 2022-09-15 DIAGNOSIS — Z7901 Long term (current) use of anticoagulants: Secondary | ICD-10-CM | POA: Diagnosis not present

## 2022-09-15 LAB — POCT INR: INR: 5.5 — AB (ref 2.0–3.0)

## 2022-09-15 NOTE — Patient Instructions (Signed)
Hold warfarin tonight and tomorrow night then decrease dose to 1 tablet daily except 1/2 tablet on Mondays, Wednesdays and Fridays. Recheck in 1 week Denies S/S of bleeding.  Bleeding and fall precautions discussed with pt and he verbalized understanding.

## 2022-09-23 ENCOUNTER — Ambulatory Visit: Payer: PPO | Attending: Internal Medicine | Admitting: *Deleted

## 2022-09-23 DIAGNOSIS — Z5181 Encounter for therapeutic drug level monitoring: Secondary | ICD-10-CM

## 2022-09-23 DIAGNOSIS — Z7901 Long term (current) use of anticoagulants: Secondary | ICD-10-CM

## 2022-09-23 DIAGNOSIS — I82433 Acute embolism and thrombosis of popliteal vein, bilateral: Secondary | ICD-10-CM | POA: Diagnosis not present

## 2022-09-23 DIAGNOSIS — I2609 Other pulmonary embolism with acute cor pulmonale: Secondary | ICD-10-CM

## 2022-09-23 LAB — POCT INR: INR: 2.7 (ref 2.0–3.0)

## 2022-09-23 NOTE — Patient Instructions (Signed)
Decrease warfarin to 1/2 tablet daily except 1 tablet on Sundays, Tuesdays and Thursdays. Recheck in 1 week

## 2022-09-24 ENCOUNTER — Ambulatory Visit (HOSPITAL_COMMUNITY)
Admission: RE | Admit: 2022-09-24 | Discharge: 2022-09-24 | Disposition: A | Discharge status: Home / Self Care | Payer: PPO | Source: Ambulatory Visit | Attending: Family Medicine | Admitting: Family Medicine

## 2022-09-24 ENCOUNTER — Other Ambulatory Visit (HOSPITAL_COMMUNITY): Payer: Self-pay | Admitting: Family Medicine

## 2022-09-24 DIAGNOSIS — M25571 Pain in right ankle and joints of right foot: Secondary | ICD-10-CM

## 2022-09-27 NOTE — Progress Notes (Unsigned)
Referring Provider: [name], MD Primary Care Physician:  [name], MD Primary GI Physician: Dr. Gala Romney  Chief Complaint  Patient presents with   Follow-up    HPI:   [name] is a 67 y.o. male presenting today with a history of CAD, COPD, DVT/PE, Factor 5 Leiden mutation chronically anticoagulated with Coumadin, prostate cancer, GERD, PUD, esophageal stricture s/p dilation, colon polyps, complicated diverticulitis with abscess s/p rectosigmoid resection July 2023, fatty liver, presenting today for follow-up.   Last seen in our office 04/29/2022.  He had an episode of chest pain while at [facility] on vacation that lasted him within 3 minutes.  He was evaluated in the emergency room with negative workup for cardiac ischemia and PE.  He was not on a PPI at that time with no denied his symptoms feeling like his prior typical reflux symptoms. He was restarted on Protonix 40 mg daily.  Recommended follow-up with cardiology.  Again with brief admission in December 2023 for atypical chest pain.  CT angio negative for PE.  Stress testing was within normal limits.  2D echocardiogram with preserved LVEF.  Suspected symptoms to be secondary to GERD/hiatal hernia.  He was given Carafate and advised to continue Protonix daily.    Today:   GERD: Well controlled. No dysphagia. No abdominal pain, nausea, vomiting. No recurrent chest pain since starting Protonix.   Last EGD December 2018 with crpe paper esophagus at the GE junction, mild narrowing s/p dilation and biopsy, medium size hiatal hernia, otherwise normal exam.  Pathology consistent with reflux changes.   Elevated LFTs:  Hepatic steatosis/hepatomegaly noted on prior imaging.  Last imaging with chest CT 08/18/2022 with mild hepatic steatosis.  Persistent mild elevation since January 2023. Last labs in December 2023.  Hep C antibody nonreactive and hepatitis B surface antigen negative in February 2023.   Weight:  Wt  Readings from Last 3 Encounters:  09/29/22 231 lb 12.8 oz (105.1 kg)  07/28/22 240 lb (108.9 kg)  07/24/22 243 lb (110.2 kg)   Trying to lose weight. Eating about 1 lb of vegetables a day.  Going to the [facility].   Colonoscopy due in 2027. Bowels are moving well. No brbpr or melena. Takes psyllium daily.   Past Medical History:  Diagnosis Date   Bilateral shoulder bursitis    Chronic back pain    COPD (chronic obstructive pulmonary disease) with emphysema ([name])    DDD (degenerative disc disease), lumbar    hx epideral injection  L5--S1    Diverticulitis    DVT (deep venous thrombosis) ([place]) 11/19/2018   Dysphasia    intermittant w/ food    Dyspnea    Dyspnea on exertion    Fatty liver    GERD (gastroesophageal reflux disease)    Gout    L great toe   Heterozygous factor V Leiden mutation ([place])    Hiatal hernia    Hip problem 2020   left, seeing orthopedics   History of acute pancreatitis    alcoholic pancreatitis 99991111 and 01-30-2012. 2021   History of colon polyps    10-17-2005  hyperplastic polyp's   History of esophageal stricture    s/p  dilation 02-02-2017   History of peptic ulcer 1990s   History of pneumococcal pneumonia 2006   bilateral pneumonia w/ left lower lobe abscess treated w/ chest tube with suction for drainage   History of pneumothorax    1984--  left spontaneous pneumothorax ,  treated w/ chest  tube   Hypertension    Malignant neoplasm of prostate [facility]) urologist-  dr dahlstedt/  oncologist -- dr Tammi Klippel   dx 12-02-2016-- Stage T2b, Gleason 3+4,  PSA 5.99,  vol 25cc   Neuropathy    Numbness of left foot    outer left ankle numb-- per pt has appt. w/ neurologist   OSA (obstructive sleep apnea)    per pt has not used cpap over year ago from 04-16-2017--- per study 01-17-2010  severe osa   Pneumonia    Solid nodule of lung greater than 8 mm in diameter 05/11/2018   03/2018: RUL Nodule, 52m   Wears glasses     Past Surgical History:  Procedure  Laterality Date   BIOPSY  02/02/2017   Procedure: BIOPSY;  Surgeon: RDaneil Dolin MD;  [facility];  Service: Endoscopy;;  duodenal bx's, esophgeal bx's   BIOPSY  07/30/2017   Procedure: BIOPSY;  Surgeon: RDaneil Dolin MD;  [facility];  Service: Endoscopy;;  esophagus   BIOPSY  02/15/2021   Procedure: BIOPSY;  Surgeon: RDaneil Dolin MD;  [facility];  Service: Endoscopy;;   CARDIOVASCULAR STRESS TEST  09/12/2009   normal nuclear perfusion study w/ no ischemia /  normal LV function and wall motion , ef 57%   COLONOSCOPY  2007   Dr. RGala Romney hyperplastic polyps, internal hemorrhoids    COLONOSCOPY WITH PROPOFOL N/A 05/03/2018   Dr. RGala Romney diverticulosis in entire colon, multiple polyps in sigmoid, at splenic flexure, and ascending colon, not all polyps removed. Tubular adenomas and inflammatory polyps. Needs 3 year surveilance   COLONOSCOPY WITH PROPOFOL N/A 02/15/2021   Many polyps without adenomatous changes but all inflammatory in nature with mildly active colitis and chronic inflammatory mucosa.  Repeat in 5 years.   EPIDURAL BLOCK INJECTION  2020   ESOPHAGOGASTRODUODENOSCOPY (EGD) WITH PROPOFOL N/A 02/02/2017   Dr. RGala Romney esophageal stenosis s/p dilation and biopsy, medium sized hiatal hernia, normal duodenum   ESOPHAGOGASTRODUODENOSCOPY (EGD) WITH PROPOFOL N/A 07/30/2017   Dr. RGala Romney esophageal stenosis s/p dilation and biopsy, medium-sized hiatal hernia, normal duodenum   HEMORRHOID SURGERY  01-21-2008    AForestine Na  HIP ARTHROPLASTY Bilateral    INTRAVASCULAR PRESSURE WIRE/FFR STUDY N/A 08/01/2021   Procedure: INTRAVASCULAR PRESSURE WIRE/FFR STUDY;  Surgeon: MBurnell Blanks MD;  [facility];  Service: Cardiovascular;  Laterality: N/A;   IR RADIOLOGIST EVAL & MGMT  08/29/2020   LEFT HEART CATH AND CORONARY ANGIOGRAPHY N/A 08/01/2021   Procedure: LEFT HEART CATH AND CORONARY ANGIOGRAPHY;  Surgeon: MBurnell Blanks MD;  [facility];  Service: Cardiovascular;  Laterality: N/A;   MALONEY DILATION N/A 02/02/2017   Procedure: MVenia MinksDILATION;  Surgeon: RDaneil Dolin MD;  [facility];  Service: Endoscopy;  Laterality: N/A;   MALONEY DILATION N/A 07/30/2017   Procedure: MVenia MinksDILATION;  Surgeon: RDaneil Dolin MD;  [facility];  Service: Endoscopy;  Laterality: N/A;   POLYPECTOMY  05/03/2018   Procedure: POLYPECTOMY;  Surgeon: RDaneil Dolin MD;  [facility];  Service: Endoscopy;;  ascending colon polyp, sigmoid colon polyps (multiple)   POLYPECTOMY  02/15/2021   Procedure: POLYPECTOMY;  Surgeon: RDaneil Dolin MD;  [facility];  Service: Endoscopy;;   RADIOACTIVE SEED IMPLANT N/A 04/23/2017   Procedure: RADIOACTIVE SEED IMPLANT/BRACHYTHERAPY IMPLANT;  Surgeon: DFranchot Gallo MD;  [facility]  Service: Urology;  Laterality: N/A;   SHOULDER ARTHROSCOPY  08/05/2003   SPACE OAR INSTILLATION N/A 04/23/2017   Procedure: SPACE OAR INSTILLATION;  Surgeon: Franchot Gallo, MD;  [facility];  Service: Urology;  Laterality: N/A;   TRANSTHORACIC ECHOCARDIOGRAM  03/28/2009   mild LVH, ef 55-60%/ mild aorta calcification   VASECTOMY  08/05/1983   VIDEO ASSISTED THORACOSCOPY (VATS)/DECORTICATION Left     Current Outpatient Medications  Medication Sig Dispense Refill   acetaminophen (TYLENOL) 500 MG tablet Take 2 tablets (1,000 mg total) by mouth every 6 (six) hours as needed for mild pain. 30 tablet 0   albuterol (PROVENTIL) (2.5 MG/3ML) 0.083% nebulizer solution Take 2.5 mg by nebulization every 6 (six) hours as needed for wheezing or shortness of breath.     albuterol (VENTOLIN HFA) 108 (90 Base) MCG/ACT inhaler Inhale 2 puffs into the lungs every 6 (six) hours as needed for wheezing or shortness of breath.     allopurinol (ZYLOPRIM) 300 MG tablet Take 300 mg by mouth  daily.     doxycycline (VIBRAMYCIN) 50 MG capsule Take 50 mg by mouth daily.     nitroGLYCERIN (NITROSTAT) 0.4 MG SL tablet Place 1 tablet (0.4 mg total) under the tongue every 5 (five) minutes as needed for chest pain. 100 tablet 3   NON FORMULARY Pt uses a cpap nightly     pantoprazole (PROTONIX) 40 MG tablet Take 1 tablet (40 mg total) by mouth daily. 30 tablet 11   rosuvastatin (CRESTOR) 20 MG tablet TAKE 1 TABLET(20 MG) BY MOUTH DAILY 90 tablet 3   TRELEGY ELLIPTA 100-62.5-25 MCG/ACT AEPB INHALE 1 PUFF INTO THE LUNGS DAILY 60 each 5   warfarin (COUMADIN) 2 MG tablet Take 1 tablet (2 mg total) by mouth daily. (Patient taking differently: Take 1 mg by mouth daily.) 90 tablet 3   No current facility-administered medications for this visit.    Allergies as of 09/29/2022   (No Known Allergies)    Family History  Problem Relation Age of Onset   Diabetes Mother    Heart disease Father    Heart disease Brother    Cancer Neg Hx    Colon cancer Neg Hx    Colon polyps Neg Hx    Neuropathy Neg Hx     Social History   Socioeconomic History   Marital status: Married    Spouse name: Not on file   Number of children: 2   Years of education: Not on file   Highest education level: Bachelor's degree (e.g., BA, AB, BS)  Occupational History   Not on file  Tobacco Use   Smoking status: Former    Packs/day: 1.00    Years: 30.00    Total pack years: 30.00    Types: Cigarettes, E-cigarettes    Quit date: 10/03/2015    Years since quitting: 6.9    Passive exposure: Past   Smokeless tobacco: Never   Tobacco comments:    stoped vaping 2018  Vaping Use   Vaping Use: Former  Substance and Sexual Activity   Alcohol use: Yes    Alcohol/week: 5.0 standard drinks of alcohol    Types: 5 Glasses of wine per week   Drug use: No   Sexual activity: Yes    Birth control/protection: None  Other Topics Concern   Not on file  Social History Narrative   Lives at home with his wife   Right  handed   Social Determinants of Health   Financial Resource Strain: Not  on file  Food Insecurity: No Food Insecurity (07/29/2022)   Hunger Vital Sign    Worried About Running Out of Food in the Last Year: Never true    Ran Out of Food in the Last Year: Never true  Transportation Needs: No Transportation Needs (07/29/2022)   PRAPARE - Hydrologist (Medical): No    Lack of Transportation (Non-Medical): No  Physical Activity: Not on file  Stress: Not on file  Social Connections: Not on file    Review of Systems: Gen: Denies fever, chills, cold or flulike symptoms, presyncope, syncope. CV: Denies chest pain, palpitations. Resp: Denies dyspnea, cough. GI: See HPI Derm: Denies rash. Psych: Denies depression, anxiety. Heme: See HPI  Physical Exam: BP 117/71 (BP Location: Left Arm, Patient Position: Sitting, Cuff Size: Large)   Pulse 87   Temp 97.7 F (36.5 C) (Temporal)   Ht 6' 1.5" (1.867 m)   Wt 231 lb 12.8 oz (105.1 kg)   SpO2 96%   BMI 30.17 kg/m  General:   Alert and oriented. No distress noted. Pleasant and cooperative.  Head:  Normocephalic and atraumatic. Eyes:  Conjuctiva clear without scleral icterus. Heart:  S1, S2 present without murmurs appreciated. Lungs:  Clear to auscultation bilaterally. No wheezes, rales, or rhonchi. No distress.  Abdomen:  +BS, soft, non-tender and non-distended. No rebound or guarding. No HSM or masses noted. Msk:  Symmetrical without gross deformities. Normal posture. Extremities:  Without edema. Chronic venous stasis changes.  Neurologic:  Alert and  oriented x4 Psych:  Normal mood and affect.    Assessment:  67 y.o. male presenting today with a history of CAD, COPD, DVT/PE, Factor 5 Leiden mutation chronically anticoagulated with Coumadin, prostate cancer, GERD, PUD, esophageal stricture s/p dilation, colon polyps, complicated diverticulitis with abscess s/p rectosigmoid resection July 2023, fatty liver,  presenting today for follow-up.    GERD:  Well-controlled on pantoprazole 40 mg daily.  No alarm symptoms.  Fatty liver/elevated LFTs: Mildly elevated LFTs likely secondary to known fatty liver disease. He does drink alcohol, but not heavily.  LFTs have been mildly elevated since January 2023 with most recent labs in December 2023 with AST 75, ALT 52.  Platelets were slightly low at 141 in December as well.  Hepatitis C antibody nonreactive and hepatitis B surface antigen negative in February 2023.  He has recently started dieting and exercising in efforts to lose weight.  He is lost about 12 pounds in the last couple of months.  Will plan to repeat LFTs at this time, obtain an ultrasound with elastography to evaluate degree for evidence of fibrosis.  He was counseled on fatty liver with the importance of weight loss through diet and exercise.  I anticipate his LFTs will improve with weight loss.  However, if LFTs are persistently elevated or rise, we will need further evaluation.   Plan:  HFP RUQ [place] with elastography.  Instructions for fatty liver: Recommend 1-2# weight loss per week until ideal body weight through exercise & diet. Low fat/cholesterol diet.   Avoid sweets, sodas, fruit juices, sweetened beverages like tea, etc. Gradually increase exercise from 15 min daily up to 1 hr per day 5 days/week. Limit alcohol use. Avoid over-the-counter supplements and herbal teas. No more than 3000 mg of Tylenol per 24 hours if needed. Continue Protonix 40 mg daily.  Reinforced GERD diet/lifestyle.  Separate instructions provided on AVS. Follow-up in 3 months.    Aliene Altes, PA-C [facility] 09/29/2022

## 2022-09-29 ENCOUNTER — Ambulatory Visit (INDEPENDENT_AMBULATORY_CARE_PROVIDER_SITE_OTHER): Payer: PPO | Admitting: Gastroenterology

## 2022-09-29 ENCOUNTER — Encounter: Payer: Self-pay | Admitting: Gastroenterology

## 2022-09-29 VITALS — BP 117/71 | HR 87 | Temp 97.7°F | Ht 73.5 in | Wt 231.8 lb

## 2022-09-29 DIAGNOSIS — R7989 Other specified abnormal findings of blood chemistry: Secondary | ICD-10-CM

## 2022-09-29 DIAGNOSIS — K219 Gastro-esophageal reflux disease without esophagitis: Secondary | ICD-10-CM

## 2022-09-29 DIAGNOSIS — K760 Fatty (change of) liver, not elsewhere classified: Secondary | ICD-10-CM

## 2022-09-29 NOTE — Patient Instructions (Addendum)
We will arrange you to have an ultrasound of your liver at [facility].  Please have blood work completed at [facility].  Instructions for fatty liver: Recommend 1-2# weight loss per week until ideal body weight through exercise & diet. Low fat/cholesterol diet.   Avoid sweets, sodas, fruit juices, sweetened beverages like tea, etc. Gradually increase exercise from 15 min daily up to 1 hr per day 5 days/week. Limit alcohol use.  Avoid over the counter supplements, herbal teas.   Continue Protonix 40 mg daily 30 minutes before breakfast.   Follow a GERD diet:  Avoid fried, fatty, greasy, spicy, citrus foods. Avoid caffeine and carbonated beverages. Avoid chocolate. Try eating 4-6 small meals a day rather than 3 large meals. Do not eat within 3 hours of laying down. Prop head of bed up on wood or bricks to create a 6 inch incline.  We will follow-up in 3 months or sooner if needed.   No more than 3000 mg of tylenol per 24 hours if needed.   It was a pleasure to meet you today! I want to create trusting relationships with patients. If you receive a survey regarding your visit,  I greatly appreciate you taking time to fill this out on paper or through your MyChart. I value your feedback.  Aliene Altes, PA-C [facility]

## 2022-09-30 ENCOUNTER — Ambulatory Visit: Payer: PPO | Attending: Internal Medicine | Admitting: Pharmacist

## 2022-09-30 DIAGNOSIS — D6851 Activated protein C resistance: Secondary | ICD-10-CM | POA: Diagnosis not present

## 2022-09-30 DIAGNOSIS — I82433 Acute embolism and thrombosis of popliteal vein, bilateral: Secondary | ICD-10-CM

## 2022-09-30 DIAGNOSIS — Z5181 Encounter for therapeutic drug level monitoring: Secondary | ICD-10-CM | POA: Diagnosis not present

## 2022-09-30 DIAGNOSIS — I2609 Other pulmonary embolism with acute cor pulmonale: Secondary | ICD-10-CM | POA: Diagnosis not present

## 2022-09-30 DIAGNOSIS — Z7901 Long term (current) use of anticoagulants: Secondary | ICD-10-CM

## 2022-09-30 LAB — POCT INR: INR: 2.1 (ref 2.0–3.0)

## 2022-09-30 NOTE — Patient Instructions (Signed)
Description   Continue taking 1/2 tablet daily except 1 tablet on Sundays, Tuesdays and Thursdays. Recheck in 2 weeks

## 2022-10-18 LAB — HEPATIC FUNCTION PANEL
AG Ratio: 1.5 (calc) (ref 1.0–2.5)
ALT: 38 U/L (ref 9–46)
AST: 53 U/L — ABNORMAL HIGH (ref 10–35)
Albumin: 3.9 g/dL (ref 3.6–5.1)
Alkaline phosphatase (APISO): 71 U/L (ref 35–144)
Bilirubin, Direct: 0.2 mg/dL (ref 0.0–0.2)
Globulin: 2.6 g/dL (calc) (ref 1.9–3.7)
Indirect Bilirubin: 0.4 mg/dL (calc) (ref 0.2–1.2)
Total Bilirubin: 0.6 mg/dL (ref 0.2–1.2)
Total Protein: 6.5 g/dL (ref 6.1–8.1)

## 2022-10-20 ENCOUNTER — Ambulatory Visit: Payer: PPO | Attending: Internal Medicine | Admitting: *Deleted

## 2022-10-20 DIAGNOSIS — I82433 Acute embolism and thrombosis of popliteal vein, bilateral: Secondary | ICD-10-CM | POA: Diagnosis not present

## 2022-10-20 DIAGNOSIS — Z5181 Encounter for therapeutic drug level monitoring: Secondary | ICD-10-CM | POA: Diagnosis not present

## 2022-10-20 DIAGNOSIS — I2609 Other pulmonary embolism with acute cor pulmonale: Secondary | ICD-10-CM | POA: Diagnosis not present

## 2022-10-20 DIAGNOSIS — Z7901 Long term (current) use of anticoagulants: Secondary | ICD-10-CM

## 2022-10-20 LAB — POCT INR: INR: 2.1 (ref 2.0–3.0)

## 2022-10-20 NOTE — Patient Instructions (Signed)
Continue taking 1/2 tablet daily except 1 tablet on Sundays, Tuesdays and Thursdays. Recheck in 3 weeks

## 2022-10-21 ENCOUNTER — Other Ambulatory Visit (HOSPITAL_COMMUNITY): Payer: Self-pay | Admitting: Nurse Practitioner

## 2022-10-21 ENCOUNTER — Ambulatory Visit (HOSPITAL_COMMUNITY)
Admission: RE | Admit: 2022-10-21 | Discharge: 2022-10-21 | Disposition: A | Discharge status: Home / Self Care | Payer: PPO | Source: Ambulatory Visit | Attending: Gastroenterology | Admitting: Gastroenterology

## 2022-10-21 DIAGNOSIS — K760 Fatty (change of) liver, not elsewhere classified: Secondary | ICD-10-CM | POA: Insufficient documentation

## 2022-10-21 DIAGNOSIS — R7989 Other specified abnormal findings of blood chemistry: Secondary | ICD-10-CM | POA: Diagnosis not present

## 2022-10-21 DIAGNOSIS — M25471 Effusion, right ankle: Secondary | ICD-10-CM

## 2022-10-26 ENCOUNTER — Emergency Department (HOSPITAL_COMMUNITY): Payer: PPO

## 2022-10-26 ENCOUNTER — Encounter (HOSPITAL_COMMUNITY): Payer: Self-pay | Admitting: Family Medicine

## 2022-10-26 ENCOUNTER — Inpatient Hospital Stay (HOSPITAL_COMMUNITY)
Admission: EM | Admit: 2022-10-26 | Discharge: 2022-10-28 | DRG: 439 | Disposition: A | Discharge status: Home / Self Care | Payer: PPO | Attending: Internal Medicine | Admitting: Internal Medicine

## 2022-10-26 ENCOUNTER — Other Ambulatory Visit: Payer: Self-pay

## 2022-10-26 DIAGNOSIS — Z86711 Personal history of pulmonary embolism: Secondary | ICD-10-CM | POA: Diagnosis not present

## 2022-10-26 DIAGNOSIS — Z8719 Personal history of other diseases of the digestive system: Secondary | ICD-10-CM | POA: Diagnosis not present

## 2022-10-26 DIAGNOSIS — K859 Acute pancreatitis without necrosis or infection, unspecified: Secondary | ICD-10-CM | POA: Insufficient documentation

## 2022-10-26 DIAGNOSIS — D696 Thrombocytopenia, unspecified: Secondary | ICD-10-CM | POA: Diagnosis present

## 2022-10-26 DIAGNOSIS — Z8249 Family history of ischemic heart disease and other diseases of the circulatory system: Secondary | ICD-10-CM | POA: Diagnosis not present

## 2022-10-26 DIAGNOSIS — F1010 Alcohol abuse, uncomplicated: Secondary | ICD-10-CM | POA: Diagnosis present

## 2022-10-26 DIAGNOSIS — G8929 Other chronic pain: Secondary | ICD-10-CM | POA: Diagnosis present

## 2022-10-26 DIAGNOSIS — K852 Alcohol induced acute pancreatitis without necrosis or infection: Secondary | ICD-10-CM | POA: Diagnosis present

## 2022-10-26 DIAGNOSIS — Z86718 Personal history of other venous thrombosis and embolism: Secondary | ICD-10-CM | POA: Diagnosis not present

## 2022-10-26 DIAGNOSIS — C61 Malignant neoplasm of prostate: Secondary | ICD-10-CM | POA: Diagnosis present

## 2022-10-26 DIAGNOSIS — K219 Gastro-esophageal reflux disease without esophagitis: Secondary | ICD-10-CM | POA: Diagnosis present

## 2022-10-26 DIAGNOSIS — Z8546 Personal history of malignant neoplasm of prostate: Secondary | ICD-10-CM | POA: Diagnosis not present

## 2022-10-26 DIAGNOSIS — G4733 Obstructive sleep apnea (adult) (pediatric): Secondary | ICD-10-CM | POA: Diagnosis present

## 2022-10-26 DIAGNOSIS — Z6830 Body mass index (BMI) 30.0-30.9, adult: Secondary | ICD-10-CM

## 2022-10-26 DIAGNOSIS — I10 Essential (primary) hypertension: Secondary | ICD-10-CM | POA: Diagnosis present

## 2022-10-26 DIAGNOSIS — Z87891 Personal history of nicotine dependence: Secondary | ICD-10-CM | POA: Diagnosis not present

## 2022-10-26 DIAGNOSIS — Y92009 Unspecified place in unspecified non-institutional (private) residence as the place of occurrence of the external cause: Secondary | ICD-10-CM | POA: Diagnosis not present

## 2022-10-26 DIAGNOSIS — K7010 Alcoholic hepatitis without ascites: Secondary | ICD-10-CM | POA: Diagnosis present

## 2022-10-26 DIAGNOSIS — K860 Alcohol-induced chronic pancreatitis: Secondary | ICD-10-CM | POA: Diagnosis present

## 2022-10-26 DIAGNOSIS — Z91128 Patient's intentional underdosing of medication regimen for other reason: Secondary | ICD-10-CM

## 2022-10-26 DIAGNOSIS — K760 Fatty (change of) liver, not elsewhere classified: Secondary | ICD-10-CM | POA: Diagnosis present

## 2022-10-26 DIAGNOSIS — K2980 Duodenitis without bleeding: Secondary | ICD-10-CM | POA: Diagnosis present

## 2022-10-26 DIAGNOSIS — Z8711 Personal history of peptic ulcer disease: Secondary | ICD-10-CM

## 2022-10-26 DIAGNOSIS — D6851 Activated protein C resistance: Secondary | ICD-10-CM | POA: Diagnosis present

## 2022-10-26 DIAGNOSIS — T45516A Underdosing of anticoagulants, initial encounter: Secondary | ICD-10-CM | POA: Diagnosis present

## 2022-10-26 DIAGNOSIS — Z7901 Long term (current) use of anticoagulants: Secondary | ICD-10-CM | POA: Diagnosis not present

## 2022-10-26 DIAGNOSIS — E669 Obesity, unspecified: Secondary | ICD-10-CM | POA: Diagnosis present

## 2022-10-26 DIAGNOSIS — J439 Emphysema, unspecified: Secondary | ICD-10-CM | POA: Diagnosis present

## 2022-10-26 DIAGNOSIS — G629 Polyneuropathy, unspecified: Secondary | ICD-10-CM | POA: Diagnosis present

## 2022-10-26 DIAGNOSIS — Z833 Family history of diabetes mellitus: Secondary | ICD-10-CM

## 2022-10-26 DIAGNOSIS — Z96643 Presence of artificial hip joint, bilateral: Secondary | ICD-10-CM | POA: Diagnosis present

## 2022-10-26 DIAGNOSIS — K8500 Idiopathic acute pancreatitis without necrosis or infection: Principal | ICD-10-CM

## 2022-10-26 DIAGNOSIS — J449 Chronic obstructive pulmonary disease, unspecified: Secondary | ICD-10-CM | POA: Diagnosis present

## 2022-10-26 DIAGNOSIS — Z79899 Other long term (current) drug therapy: Secondary | ICD-10-CM

## 2022-10-26 LAB — DIFFERENTIAL
Abs Immature Granulocytes: 0.05 10*3/uL (ref 0.00–0.07)
Basophils Absolute: 0 10*3/uL (ref 0.0–0.1)
Basophils Relative: 0 %
Eosinophils Absolute: 0 10*3/uL (ref 0.0–0.5)
Eosinophils Relative: 0 %
Immature Granulocytes: 0 %
Lymphocytes Relative: 14 %
Lymphs Abs: 1.6 10*3/uL (ref 0.7–4.0)
Monocytes Absolute: 0.7 10*3/uL (ref 0.1–1.0)
Monocytes Relative: 7 %
Neutro Abs: 8.7 10*3/uL — ABNORMAL HIGH (ref 1.7–7.7)
Neutrophils Relative %: 79 %

## 2022-10-26 LAB — URINALYSIS, ROUTINE W REFLEX MICROSCOPIC
Bilirubin Urine: NEGATIVE
Glucose, UA: NEGATIVE mg/dL
Hgb urine dipstick: NEGATIVE
Ketones, ur: NEGATIVE mg/dL
Leukocytes,Ua: NEGATIVE
Nitrite: NEGATIVE
Protein, ur: NEGATIVE mg/dL
Specific Gravity, Urine: >1.046 — ABNORMAL HIGH (ref 1.005–1.030)
pH: 7 (ref 5.0–8.0)

## 2022-10-26 LAB — CBC
HCT: 49.4 % (ref 39.0–52.0)
Hemoglobin: 16.9 g/dL (ref 13.0–17.0)
MCH: 31.8 pg (ref 26.0–34.0)
MCHC: 34.2 g/dL (ref 30.0–36.0)
MCV: 93 fL (ref 80.0–100.0)
Platelets: 141 10*3/uL — ABNORMAL LOW (ref 150–400)
RBC: 5.31 MIL/uL (ref 4.22–5.81)
RDW: 13.3 % (ref 11.5–15.5)
WBC: 11.1 10*3/uL — ABNORMAL HIGH (ref 4.0–10.5)
nRBC: 0 % (ref 0.0–0.2)

## 2022-10-26 LAB — BASIC METABOLIC PANEL
Anion gap: 10 (ref 5–15)
BUN: 11 mg/dL (ref 8–23)
CO2: 25 mmol/L (ref 22–32)
Calcium: 8.6 mg/dL — ABNORMAL LOW (ref 8.9–10.3)
Chloride: 103 mmol/L (ref 98–111)
Creatinine, Ser: 0.76 mg/dL (ref 0.61–1.24)
GFR, Estimated: >60 mL/min (ref >60)
Glucose, Bld: 168 mg/dL — ABNORMAL HIGH (ref 70–99)
Potassium: 3.9 mmol/L (ref 3.5–5.1)
Sodium: 138 mmol/L (ref 135–145)

## 2022-10-26 LAB — CBG MONITORING, ED: Glucose-Capillary: 161 mg/dL — ABNORMAL HIGH (ref 70–99)

## 2022-10-26 LAB — LIPASE, BLOOD: Lipase: 307 U/L — ABNORMAL HIGH (ref 11–51)

## 2022-10-26 LAB — HEPATIC FUNCTION PANEL
ALT: 44 U/L (ref 0–44)
AST: 56 U/L — ABNORMAL HIGH (ref 15–41)
Albumin: 3.7 g/dL (ref 3.5–5.0)
Alkaline Phosphatase: 84 U/L (ref 38–126)
Bilirubin, Direct: 0.1 mg/dL (ref 0.0–0.2)
Indirect Bilirubin: 0.6 mg/dL (ref 0.3–0.9)
Total Bilirubin: 0.7 mg/dL (ref 0.3–1.2)
Total Protein: 7 g/dL (ref 6.5–8.1)

## 2022-10-26 LAB — PROTIME-INR
INR: 1.9 — ABNORMAL HIGH (ref 0.8–1.2)
Prothrombin Time: 21.2 seconds — ABNORMAL HIGH (ref 11.4–15.2)

## 2022-10-26 LAB — TROPONIN I (HIGH SENSITIVITY)
Troponin I (High Sensitivity): 8 ng/L (ref <18)
Troponin I (High Sensitivity): 8 ng/L (ref <18)

## 2022-10-26 LAB — GLUCOSE, CAPILLARY: Glucose-Capillary: 138 mg/dL — ABNORMAL HIGH (ref 70–99)

## 2022-10-26 MED ORDER — ALBUTEROL SULFATE (2.5 MG/3ML) 0.083% IN NEBU: 2.5000 mg | INHALATION_SOLUTION | RESPIRATORY_TRACT | Status: DC | PRN

## 2022-10-26 MED ORDER — AMLODIPINE BESYLATE 5 MG PO TABS
5.0000 mg | ORAL_TABLET | Freq: Every day | ORAL | Status: DC
  Administered 2022-10-26 – 2022-10-28 (×3): 5 mg via ORAL
  Filled 2022-10-26 (×3): qty 1

## 2022-10-26 MED ORDER — FOLIC ACID 1 MG PO TABS
1.0000 mg | ORAL_TABLET | Freq: Every day | ORAL | Status: DC
  Administered 2022-10-26 – 2022-10-28 (×3): 1 mg via ORAL
  Filled 2022-10-26 (×3): qty 1

## 2022-10-26 MED ORDER — WARFARIN SODIUM 2 MG PO TABS
2.0000 mg | ORAL_TABLET | Freq: Once | ORAL | Status: AC
  Administered 2022-10-26: 2 mg via ORAL
  Filled 2022-10-26: qty 1

## 2022-10-26 MED ORDER — SODIUM CHLORIDE 0.9 % IV SOLN: INTRAVENOUS | Status: DC

## 2022-10-26 MED ORDER — PREGABALIN 50 MG PO CAPS
50.0000 mg | ORAL_CAPSULE | Freq: Three times a day (TID) | ORAL | Status: DC
  Administered 2022-10-26 – 2022-10-28 (×5): 50 mg via ORAL
  Filled 2022-10-26 (×5): qty 1

## 2022-10-26 MED ORDER — UMECLIDINIUM BROMIDE 62.5 MCG/ACT IN AEPB
1.0000 | INHALATION_SPRAY | Freq: Every day | RESPIRATORY_TRACT | Status: DC
  Administered 2022-10-27 – 2022-10-28 (×2): 1 via RESPIRATORY_TRACT
  Filled 2022-10-26: qty 7

## 2022-10-26 MED ORDER — ONDANSETRON HCL 4 MG/2ML IJ SOLN
4.0000 mg | Freq: Once | INTRAMUSCULAR | Status: AC | PRN
  Administered 2022-10-26: 4 mg via INTRAVENOUS
  Filled 2022-10-26: qty 2

## 2022-10-26 MED ORDER — HYDROMORPHONE HCL 1 MG/ML IJ SOLN
0.5000 mg | Freq: Once | INTRAMUSCULAR | Status: AC
  Administered 2022-10-26: 0.5 mg via INTRAVENOUS
  Filled 2022-10-26: qty 0.5

## 2022-10-26 MED ORDER — METOCLOPRAMIDE HCL 5 MG/ML IJ SOLN
10.0000 mg | Freq: Once | INTRAMUSCULAR | Status: AC
  Administered 2022-10-26: 10 mg via INTRAVENOUS
  Filled 2022-10-26: qty 2

## 2022-10-26 MED ORDER — SODIUM CHLORIDE 0.9 % IV BOLUS
500.0000 mL | Freq: Once | INTRAVENOUS | Status: AC
  Administered 2022-10-26: 500 mL via INTRAVENOUS

## 2022-10-26 MED ORDER — OXYCODONE HCL 5 MG PO TABS
5.0000 mg | ORAL_TABLET | ORAL | Status: DC | PRN
  Administered 2022-10-26 – 2022-10-27 (×6): 5 mg via ORAL
  Filled 2022-10-26 (×6): qty 1

## 2022-10-26 MED ORDER — ALLOPURINOL 100 MG PO TABS
300.0000 mg | ORAL_TABLET | Freq: Every day | ORAL | Status: DC
  Administered 2022-10-26 – 2022-10-28 (×3): 300 mg via ORAL
  Filled 2022-10-26 (×3): qty 3

## 2022-10-26 MED ORDER — HYDROMORPHONE HCL 1 MG/ML IJ SOLN
1.0000 mg | Freq: Once | INTRAMUSCULAR | Status: AC
  Administered 2022-10-26: 1 mg via INTRAVENOUS
  Filled 2022-10-26 (×2): qty 1

## 2022-10-26 MED ORDER — THIAMINE HCL 100 MG/ML IJ SOLN
100.0000 mg | Freq: Every day | INTRAMUSCULAR | Status: DC
  Filled 2022-10-26: qty 2

## 2022-10-26 MED ORDER — DIAZEPAM 5 MG PO TABS
5.0000 mg | ORAL_TABLET | Freq: Three times a day (TID) | ORAL | Status: AC
  Administered 2022-10-26 – 2022-10-27 (×6): 5 mg via ORAL
  Filled 2022-10-26 (×6): qty 1

## 2022-10-26 MED ORDER — ONDANSETRON HCL 4 MG/2ML IJ SOLN
4.0000 mg | Freq: Four times a day (QID) | INTRAMUSCULAR | Status: DC | PRN
  Administered 2022-10-26 – 2022-10-27 (×2): 4 mg via INTRAVENOUS
  Filled 2022-10-26 (×3): qty 2

## 2022-10-26 MED ORDER — FLUTICASONE FUROATE-VILANTEROL 100-25 MCG/ACT IN AEPB
1.0000 | INHALATION_SPRAY | Freq: Every day | RESPIRATORY_TRACT | Status: DC
  Administered 2022-10-27 – 2022-10-28 (×2): 1 via RESPIRATORY_TRACT
  Filled 2022-10-26: qty 28

## 2022-10-26 MED ORDER — PROCHLORPERAZINE EDISYLATE 10 MG/2ML IJ SOLN
10.0000 mg | Freq: Four times a day (QID) | INTRAMUSCULAR | Status: DC | PRN
  Administered 2022-10-26: 10 mg via INTRAVENOUS
  Filled 2022-10-26: qty 2

## 2022-10-26 MED ORDER — ADULT MULTIVITAMIN W/MINERALS CH
1.0000 | ORAL_TABLET | Freq: Every day | ORAL | Status: DC
  Administered 2022-10-26 – 2022-10-28 (×3): 1 via ORAL
  Filled 2022-10-26 (×3): qty 1

## 2022-10-26 MED ORDER — LORAZEPAM 2 MG/ML IJ SOLN: 1.0000 mg | INTRAMUSCULAR | Status: DC | PRN

## 2022-10-26 MED ORDER — IOHEXOL 350 MG/ML SOLN
100.0000 mL | Freq: Once | INTRAVENOUS | Status: AC | PRN
  Administered 2022-10-26: 100 mL via INTRAVENOUS

## 2022-10-26 MED ORDER — THIAMINE MONONITRATE 100 MG PO TABS
100.0000 mg | ORAL_TABLET | Freq: Every day | ORAL | Status: DC
  Administered 2022-10-26 – 2022-10-28 (×3): 100 mg via ORAL
  Filled 2022-10-26 (×3): qty 1

## 2022-10-26 MED ORDER — WARFARIN - PHARMACIST DOSING INPATIENT: Freq: Every day | Status: DC

## 2022-10-26 MED ORDER — ROSUVASTATIN CALCIUM 20 MG PO TABS
20.0000 mg | ORAL_TABLET | Freq: Every day | ORAL | Status: DC
  Administered 2022-10-26 – 2022-10-28 (×3): 20 mg via ORAL
  Filled 2022-10-26 (×3): qty 1

## 2022-10-26 MED ORDER — ONDANSETRON HCL 4 MG/2ML IJ SOLN
INTRAMUSCULAR | Status: AC
  Administered 2022-10-26: 4 mg via INTRAVENOUS
  Filled 2022-10-26: qty 2

## 2022-10-26 MED ORDER — LABETALOL HCL 5 MG/ML IV SOLN
10.0000 mg | INTRAVENOUS | Status: DC | PRN
  Administered 2022-10-26 – 2022-10-27 (×2): 10 mg via INTRAVENOUS
  Filled 2022-10-26 (×3): qty 4

## 2022-10-26 MED ORDER — LORAZEPAM 1 MG PO TABS
1.0000 mg | ORAL_TABLET | ORAL | Status: DC | PRN
  Administered 2022-10-27: 1 mg via ORAL
  Filled 2022-10-26: qty 1

## 2022-10-26 MED ORDER — PANTOPRAZOLE SODIUM 40 MG IV SOLR
40.0000 mg | Freq: Every day | INTRAVENOUS | Status: DC
  Administered 2022-10-26: 40 mg via INTRAVENOUS
  Filled 2022-10-26: qty 10

## 2022-10-26 MED ORDER — PANTOPRAZOLE SODIUM 40 MG IV SOLR
40.0000 mg | Freq: Once | INTRAVENOUS | Status: AC
  Administered 2022-10-26: 40 mg via INTRAVENOUS
  Filled 2022-10-26: qty 10

## 2022-10-26 MED ORDER — FENTANYL CITRATE PF 50 MCG/ML IJ SOSY
50.0000 ug | PREFILLED_SYRINGE | INTRAMUSCULAR | Status: DC | PRN
  Administered 2022-10-26 – 2022-10-27 (×5): 50 ug via INTRAVENOUS
  Filled 2022-10-26 (×5): qty 1

## 2022-10-26 NOTE — Progress Notes (Signed)
Patient has declined CPAP this evening due to emesis;RT placed patient on 2 lpm [place] for sleep and a unit is on standby for patient to use once his condition improves.

## 2022-10-26 NOTE — ED Provider Notes (Signed)
[facility] Provider Note   CSN: [id number] Arrival date & time: 10/26/22  [id number]     History {Add pertinent medical, surgical, social history, OB history to HPI:1} Chief Complaint  Patient presents with   Chest Pain   Abdominal Pain    [name] is a 67 y.o. male.  Patient complains of abdominal pain and vomiting.  He has a history of COPD and a DVT and pancreatitis.  He continues to drink alcohol   Chest Pain Associated symptoms: abdominal pain   Abdominal Pain Associated symptoms: chest pain        Home Medications Prior to Admission medications   Medication Sig Start Date End Date Taking? Authorizing Provider  acetaminophen (TYLENOL) 500 MG tablet Take 2 tablets (1,000 mg total) by mouth every 6 (six) hours as needed for mild pain. 08/21/20   Meuth, Brooke A, PA-C  albuterol (PROVENTIL) (2.5 MG/3ML) 0.083% nebulizer solution Take 2.5 mg by nebulization every 6 (six) hours as needed for wheezing or shortness of breath.    [provider]  albuterol (VENTOLIN HFA) 108 (90 Base) MCG/ACT inhaler Inhale 2 puffs into the lungs every 6 (six) hours as needed for wheezing or shortness of breath.    [provider]  allopurinol (ZYLOPRIM) 300 MG tablet Take 300 mg by mouth daily. 09/17/20   [provider]  doxycycline (VIBRAMYCIN) 50 MG capsule Take 50 mg by mouth daily. 01/15/22   [provider]  nitroGLYCERIN (NITROSTAT) 0.4 MG SL tablet Place 1 tablet (0.4 mg total) under the tongue every 5 (five) minutes as needed for chest pain. 07/29/22 07/29/23  Manuella Ghazi, Pratik D, DO  NON FORMULARY Pt uses a cpap nightly    [provider]  pantoprazole (PROTONIX) 40 MG tablet Take 1 tablet (40 mg total) by mouth daily. 04/29/22 09/29/22  Daneil Dolin, MD  rosuvastatin (CRESTOR) 20 MG tablet TAKE 1 TABLET(20 MG) BY MOUTH DAILY 04/22/22   Richardo Priest, MD  TRELEGY ELLIPTA 100-62.5-25 MCG/ACT AEPB  INHALE 1 PUFF INTO THE LUNGS DAILY 07/08/22   Icard, Octavio Graves, DO  warfarin (COUMADIN) 2 MG tablet Take 1 tablet (2 mg total) by mouth daily. Patient taking differently: Take 1 mg by mouth daily. 07/25/22   Mallipeddi, Quenten Raven, MD      Allergies    Patient has no known allergies.    Review of Systems   Review of Systems  Cardiovascular:  Positive for chest pain.  Gastrointestinal:  Positive for abdominal pain.    Physical Exam Updated Vital Signs BP (!) 183/107 (BP Location: Left Arm)   Pulse 80   Temp 97.6 F (36.4 C) (Oral)   Resp (!) 22   Ht 6\' 1"  (1.854 m)   Wt 104.3 kg   SpO2 93%   BMI 30.34 kg/m  Physical Exam  ED Results / Procedures / Treatments   Labs (all labs ordered are listed, but only abnormal results are displayed) Labs Reviewed  BASIC METABOLIC PANEL - Abnormal; Notable for the following components:      Result Value   Glucose, Bld 168 (*)    Calcium 8.6 (*)    All other components within normal limits  CBC - Abnormal; Notable for the following components:   WBC 11.1 (*)    Platelets 141 (*)    All other components within normal limits  LIPASE, BLOOD - Abnormal; Notable for the following components:   Lipase 307 (*)  All other components within normal limits  URINALYSIS, ROUTINE W REFLEX MICROSCOPIC - Abnormal; Notable for the following components:   Specific Gravity, Urine >1.046 (*)    All other components within normal limits  HEPATIC FUNCTION PANEL - Abnormal; Notable for the following components:   AST 56 (*)    All other components within normal limits  PROTIME-INR - Abnormal; Notable for the following components:   Prothrombin Time 21.2 (*)    INR 1.9 (*)    All other components within normal limits  DIFFERENTIAL - Abnormal; Notable for the following components:   Neutro Abs 8.7 (*)    All other components within normal limits  TROPONIN I (HIGH SENSITIVITY)  TROPONIN I (HIGH SENSITIVITY)    EKG None  Radiology CT Angio  Chest/Abd/Pel for Dissection W and/or Wo Contrast  Result Date: 10/26/2022 CLINICAL DATA:  Acute aortic syndrome. EXAM: CT ANGIOGRAPHY CHEST, ABDOMEN AND PELVIS TECHNIQUE: Non-contrast CT of the chest was initially obtained. Multidetector CT imaging through the chest, abdomen and pelvis was performed using the standard protocol during bolus administration of intravenous contrast. Multiplanar reconstructed images and MIPs were obtained and reviewed to evaluate the vascular anatomy. RADIATION DOSE REDUCTION: This exam was performed according to the departmental dose-optimization program which includes automated exposure control, adjustment of the mA and/or kV according to patient size and/or use of iterative reconstruction technique. CONTRAST:  184mL OMNIPAQUE IOHEXOL 350 MG/ML SOLN COMPARISON:  CT chest lung cancer screening 08/18/2022. Abdomen and pelvis CT 12/18/2021 FINDINGS: CTA CHEST FINDINGS Cardiovascular: Heart size upper normal. No pericardial effusion. Coronary artery calcification is evident. Mild atherosclerotic calcification is noted in the wall of the thoracic aorta. Pre contrast imaging shows no hyperdense crescent in the wall of the thoracic aorta to suggest the presence of an acute intramural hematoma. No dissection of the thoracic aorta. Mediastinum/Nodes: No mediastinal lymphadenopathy. No evidence for gross hilar lymphadenopathy although assessment is limited by the lack of intravenous contrast on the current study. The esophagus has normal imaging features. Small hiatal hernia evident. There is no axillary lymphadenopathy. Lungs/Pleura: Centrilobular and paraseptal emphysema evident. 9 mm stable irregular nodular opacity in the right upper lobe anteriorly is unchanged, characterized as benign on recent screening CT. No suspicious pulmonary nodule or mass. No focal airspace consolidation. No pleural effusion. Musculoskeletal: No worrisome lytic or sclerotic osseous abnormality. Review of the MIP  images confirms the above findings. CTA ABDOMEN AND PELVIS FINDINGS VASCULAR Aorta: Normal caliber aorta without aneurysm, dissection, vasculitis or significant stenosis. Celiac: Patent without evidence of aneurysm, dissection, vasculitis or significant stenosis. SMA: Patent without evidence of aneurysm, dissection, vasculitis or significant stenosis. Renals: Both renal arteries are patent without evidence of aneurysm, dissection, vasculitis, fibromuscular dysplasia or significant stenosis. IMA: Patent without evidence of aneurysm, dissection, vasculitis or significant stenosis. Inflow: Patent without evidence of aneurysm, dissection, vasculitis or significant stenosis. Veins: No obvious venous abnormality within the limitations of this arterial phase study. Review of the MIP images confirms the above findings. NON-VASCULAR Hepatobiliary: The liver shows diffusely decreased attenuation suggesting fat deposition. Stable atrophy left hepatic lobe. Gallbladder is distended. No intrahepatic or extrahepatic biliary dilation. Pancreas: Heterogeneous enhancement noted in the head of the pancreas which is enlarged with a focal area of hypodensity measuring 1.7 x 1.6 cm on 124/6. Parenchymal calcification noted in the head and tail of pancreas, associated with mild prominence of the main pancreatic duct. There is diffuse mild peripancreatic edema/inflammation. Spleen: No splenomegaly. No focal mass lesion. Adrenals/Urinary Tract: No adrenal nodule  or mass. Small bilateral renal cysts with benign features similar to prior. No followup imaging is recommended. No evidence for hydroureter. Bladder partially obscured by beam hardening artifact from bilateral hip replacement. Stomach/Bowel: Tiny hiatal hernia. Stomach otherwise unremarkable. Periduodenal edema evident, likely secondary. No small bowel wall thickening. No small bowel dilatation. The terminal ileum is normal. The appendix is not well visualized, but there is no  edema or inflammation in the region of the cecum. No gross colonic mass. No colonic wall thickening. Diverticuli are seen scattered along the entire length of the colon without CT findings of diverticulitis. Lymphatic: Scattered small peripancreatic lymph nodes are evident. No retroperitoneal lymphadenopathy. No pelvic sidewall lymphadenopathy. 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 Other: Small fluid collection identified adjacent to the rectum (image 197/6). Musculoskeletal: Bilateral hip replacement. No worrisome lytic or sclerotic osseous abnormality. Review of the MIP images confirms the above findings. IMPRESSION: 1. No evidence for acute intramural hematoma or dissection of the thoracoabdominal aorta. No thoracoabdominal aortic aneurysm 2. Heterogeneous enhancement in the head of the pancreas which is enlarged with a focal area of hypodensity measuring 1.7 x 1.6 cm. There is diffuse mild peripancreatic edema/inflammation. Imaging features suggest acute on chronic pancreatitis. Given the somewhat focal low-density in the head of the pancreas, follow-up MRI of the abdomen with and without contrast recommended to exclude underlying mass lesion. 3. Periduodenal edema, likely secondary. 4. Hepatic steatosis with stable atrophy left hepatic lobe. 5. Small hiatal hernia. 6. Diffuse colonic diverticulosis without diverticulitis. Small apparent extraperitoneal fluid collection identified adjacent to the rectum, new in the interval and nonspecific. Follow-up CT of the pelvis in 3 months recommended to ensure stability. 7.  Emphysema (ICD10-J43.9) and Aortic Atherosclerosis (ICD10-170.0) Electronically Signed   By: Misty Stanley M.D.   On: 10/26/2022 10:24    Procedures Procedures  {Document cardiac monitor, telemetry assessment procedure when appropriate:1}  Medications Ordered in ED Medications  0.9 %  sodium chloride infusion (has no administration in time range)  fentaNYL (SUBLIMAZE)  injection 50 mcg (has no administration in time range)  oxyCODONE (Oxy IR/ROXICODONE) immediate release tablet 5 mg (has no administration in time range)  ondansetron (ZOFRAN) injection 4 mg (has no administration in time range)  LORazepam (ATIVAN) tablet 1-4 mg (has no administration in time range)    Or  LORazepam (ATIVAN) injection 1-4 mg (has no administration in time range)  thiamine (VITAMIN B1) tablet 100 mg (has no administration in time range)    Or  thiamine (VITAMIN B1) injection 100 mg (has no administration in time range)  folic acid (FOLVITE) tablet 1 mg (has no administration in time range)  multivitamin with minerals tablet 1 tablet (has no administration in time range)  diazepam (VALIUM) tablet 5 mg (has no administration in time range)  pantoprazole (PROTONIX) injection 40 mg (has no administration in time range)  ondansetron (ZOFRAN) injection 4 mg (4 mg Intravenous Given 10/26/22 1009)  sodium chloride 0.9 % bolus 500 mL (500 mLs Intravenous New Bag/Given 10/26/22 0951)  pantoprazole (PROTONIX) injection 40 mg (40 mg Intravenous Given 10/26/22 0949)  HYDROmorphone (DILAUDID) injection 0.5 mg (0.5 mg Intravenous Given 10/26/22 0949)  iohexol (OMNIPAQUE) 350 MG/ML injection 100 mL (100 mLs Intravenous Contrast Given 10/26/22 0958)  HYDROmorphone (DILAUDID) injection 0.5 mg (0.5 mg Intravenous Given 10/26/22 1019)  metoCLOPramide (REGLAN) injection 10 mg (10 mg Intravenous Given 10/26/22 1048)  HYDROmorphone (DILAUDID) injection 0.5 mg (0.5 mg Intravenous Given 10/26/22 1114)    ED Course/ Medical Decision  Making/ A&P   {   Click here for ABCD2, HEART and other calculatorsREFRESH Note before signing :1}                          Medical Decision Making Amount and/or Complexity of Data Reviewed Labs: ordered. Radiology: ordered.  Risk Prescription drug management. Decision regarding hospitalization.   Patient with acute pancreatitis.  He continues to have abdominal pain  and vomiting.  He will be admitted to medicine  {Document critical care time when appropriate:1} {Document review of labs and clinical decision tools ie heart score, Chads2Vasc2 etc:1}  {Document your independent review of radiology images, and any outside records:1} {Document your discussion with family members, caretakers, and with consultants:1} {Document social determinants of health affecting pt's care:1} {Document your decision making why or why not admission, treatments were needed:1} Final Clinical Impression(s) / ED Diagnoses Final diagnoses:  Idiopathic acute pancreatitis without infection or necrosis    Rx / DC Orders ED Discharge Orders     None

## 2022-10-26 NOTE — H&P (Signed)
Patient Demographics:    Mekhai Gaymon, is a 67 y.o. male  MRN: [id number]   DOB - 1955/09/08  Admit Date - 10/26/2022  Outpatient Primary MD for the patient is [name], MD   Assessment & Plan:   Assessment and Plan:  1) acute alcoholic pancreatitis--- CTA chest abdomen pelvis suggest acute on chronic pancreatitis -Need to consider MRI in the near future to rule out mass lesion -Lipase 307 -IV fluids- Iv antiemetics prn -As needed pain medications -N.p.o. status for now -Iv Protonix for presumed reactive duodenitis  2) alcohol abuse--prior history of DTs -Not interested in alcohol cessation at this time -Folic acid and thiamine as ordered -Lorazepam per CIWA protocol  3) history of DVT/PE/factor V Leyden deficiency--- INR 1.9 = Patient missed Coumadin on 10/25/2022 due to abdominal pain and vomiting -Pharmacy consult for Coumadin therapy  4)Mild Leukocytosis and Mild Thrombocytopenia--- leukocytosis is probably reactive in setting of acute pancreatitis -Thrombocytopenia is due to combination of direct toxic effect of alcohol on bone marrow as well as alcoholic hepatitis/steatosis -No bleeding concerns at this time, watch Hgb platelets and INR closely  5)Alcoholic Hepatitis--- isolated elevation of AST noted, other LFTs are WNL -CT abdomen shows Hepatic steatosis with stable atrophy left hepatic lobe ---alcohol cessation advised, patient is not ready to quit -Avoid hepatotoxic agents  6)HTN--- elevated BP noted most likely due to acute pain secondary to acute pancreatitis -Amlodipine 5 mg daily -IV labetalol as needed  7)Class 1 Obesity/OSA -Low calorie diet, portion control and increase physical activity discussed with patient -Body mass index is 30.34 kg/m. -CPAP nightly  8)COPD/tobacco  abuse----no acute exacerbation -Bronchodilators as ordered  Status is: Inpatient  Remains inpatient appropriate because:   Dispo: The patient is from: Home              Anticipated d/c is to: Home              Anticipated d/c date is: 3 days              Patient currently is not medically stable to d/c. Barriers: Not Clinically Stable-   With History of - Reviewed by me  Past Medical History:  Diagnosis Date   Bilateral shoulder bursitis    Chronic back pain    COPD (chronic obstructive pulmonary disease) with emphysema ([name])    DDD (degenerative disc disease), lumbar    hx epideral injection  L5--S1    Diverticulitis    DVT (deep venous thrombosis) ([place]) 11/19/2018   Dysphasia    intermittant w/ food    Dyspnea    Dyspnea on exertion    Fatty liver    GERD (gastroesophageal reflux disease)    Gout    L great toe   Heterozygous factor V Leiden mutation ([place])    Hiatal hernia    Hip problem 2020   left, seeing orthopedics   History of acute pancreatitis    alcoholic pancreatitis  11-11-2011 and 01-30-2012. 2021   History of colon polyps    10-17-2005  hyperplastic polyp's   History of esophageal stricture    s/p  dilation 02-02-2017   History of peptic ulcer 1990s   History of pneumococcal pneumonia 2006   bilateral pneumonia w/ left lower lobe abscess treated w/ chest tube with suction for drainage   History of pneumothorax    1984--  left spontaneous pneumothorax ,  treated w/ chest tube   Hypertension    Malignant neoplasm of prostate [facility]) urologist-  dr dahlstedt/  oncologist -- dr Tammi Klippel   dx 12-02-2016-- Stage T2b, Gleason 3+4,  PSA 5.99,  vol 25cc   Neuropathy    Numbness of left foot    outer left ankle numb-- per pt has appt. w/ neurologist   OSA (obstructive sleep apnea)    per pt has not used cpap over year ago from 04-16-2017--- per study 01-17-2010  severe osa   Pneumonia    Solid nodule of lung greater than 8 mm in diameter 05/11/2018   03/2018:  RUL Nodule, 42mm   Wears glasses       Past Surgical History:  Procedure Laterality Date   BIOPSY  02/02/2017   Procedure: BIOPSY;  Surgeon: Daneil Dolin, MD;  [facility];  Service: Endoscopy;;  duodenal bx's, esophgeal bx's   BIOPSY  07/30/2017   Procedure: BIOPSY;  Surgeon: Daneil Dolin, MD;  [facility];  Service: Endoscopy;;  esophagus   BIOPSY  02/15/2021   Procedure: BIOPSY;  Surgeon: Daneil Dolin, MD;  [facility];  Service: Endoscopy;;   CARDIOVASCULAR STRESS TEST  09/12/2009   normal nuclear perfusion study w/ no ischemia /  normal LV function and wall motion , ef 57%   COLONOSCOPY  2007   Dr. Gala Romney: hyperplastic polyps, internal hemorrhoids    COLONOSCOPY WITH PROPOFOL N/A 05/03/2018   Dr. Gala Romney: diverticulosis in entire colon, multiple polyps in sigmoid, at splenic flexure, and ascending colon, not all polyps removed. Tubular adenomas and inflammatory polyps. Needs 3 year surveilance   COLONOSCOPY WITH PROPOFOL N/A 02/15/2021   Many polyps without adenomatous changes but all inflammatory in nature with mildly active colitis and chronic inflammatory mucosa.  Repeat in 5 years.   EPIDURAL BLOCK INJECTION  2020   ESOPHAGOGASTRODUODENOSCOPY (EGD) WITH PROPOFOL N/A 02/02/2017   Dr. Gala Romney: esophageal stenosis s/p dilation and biopsy, medium sized hiatal hernia, normal duodenum   ESOPHAGOGASTRODUODENOSCOPY (EGD) WITH PROPOFOL N/A 07/30/2017   Dr. Gala Romney: esophageal stenosis s/p dilation and biopsy, medium-sized hiatal hernia, normal duodenum   HEMORRHOID SURGERY  01-21-2008    Forestine Na   HIP ARTHROPLASTY Bilateral    INTRAVASCULAR PRESSURE WIRE/FFR STUDY N/A 08/01/2021   Procedure: INTRAVASCULAR PRESSURE WIRE/FFR STUDY;  Surgeon: Burnell Blanks, MD;  Location: [place] [facility];  Service: Cardiovascular;  Laterality: N/A;   IR RADIOLOGIST EVAL & MGMT  08/29/2020   LEFT HEART CATH AND CORONARY ANGIOGRAPHY N/A 08/01/2021    Procedure: LEFT HEART CATH AND CORONARY ANGIOGRAPHY;  Surgeon: Burnell Blanks, MD;  Location: [place] [facility];  Service: Cardiovascular;  Laterality: N/A;   MALONEY DILATION N/A 02/02/2017   Procedure: Venia Minks DILATION;  Surgeon: Daneil Dolin, MD;  [facility];  Service: Endoscopy;  Laterality: N/A;   MALONEY DILATION N/A 07/30/2017   Procedure: Venia Minks DILATION;  Surgeon: Daneil Dolin, MD;  [facility];  Service: Endoscopy;  Laterality: N/A;  POLYPECTOMY  05/03/2018   Procedure: POLYPECTOMY;  Surgeon: Daneil Dolin, MD;  [facility];  Service: Endoscopy;;  ascending colon polyp, sigmoid colon polyps (multiple)   POLYPECTOMY  02/15/2021   Procedure: POLYPECTOMY;  Surgeon: Daneil Dolin, MD;  [facility];  Service: Endoscopy;;   RADIOACTIVE SEED IMPLANT N/A 04/23/2017   Procedure: RADIOACTIVE SEED IMPLANT/BRACHYTHERAPY IMPLANT;  Surgeon: Franchot Gallo, MD;  Location: [facility];  Service: Urology;  Laterality: N/A;   SHOULDER ARTHROSCOPY  08/05/2003   SPACE OAR INSTILLATION N/A 04/23/2017   Procedure: SPACE OAR INSTILLATION;  Surgeon: Franchot Gallo, MD;  Location: [facility];  Service: Urology;  Laterality: N/A;   TRANSTHORACIC ECHOCARDIOGRAM  03/28/2009   mild LVH, ef 55-60%/ mild aorta calcification   VASECTOMY  08/05/1983   VIDEO ASSISTED THORACOSCOPY (VATS)/DECORTICATION Left     Chief Complaint  Patient presents with   Chest Pain   Abdominal Pain      HPI:    Kira Trom  is a 68 y.o. male with Pmhx relevant for COPD, tobacco abuse, history of prior DVT/PE in the setting of  factor V Leyden mutation, GERD, hypertension, OSA---and history of alcohol abuse with recurrent episodes of alcoholic pancreatitis as well as HTN and history of prostate cancer who presents to the ED with abdominal pain in the epigastric/retrosternal area as well as left upper quadrant area that  started on 10/25/2022 associated with nausea vomiting -Denies hematemesis -Additional history obtained from patient's wife at bedside No fever  Or chills  -No diarrhea -Emesis is without bile or blood -No sick contacts at home -Patient admits to  ongoing alcohol abuse -CTA chest abdomen and pelvis shows--IMPRESSION: 1. No evidence for acute intramural hematoma or dissection of the thoracoabdominal aorta. No thoracoabdominal aortic aneurysm 2. Heterogeneous enhancement in the head of the pancreas which is enlarged with a focal area of hypodensity measuring 1.7 x 1.6 cm. There is diffuse mild peripancreatic edema/inflammation. Imaging features suggest acute on chronic pancreatitis. Given the somewhat focal low-density in the head of the pancreas, follow-up MRI of the abdomen with and without contrast recommended to exclude underlying mass lesion. 3. Periduodenal edema, likely secondary. 4. Hepatic steatosis with stable atrophy left hepatic lobe.  -INR is 1.9 -Initial troponin is 8, repeat troponin is 8, EKG sinus rhythm without acute changes -UA unremarkable except for high specific gravity - isolated elevation of AST otherwise normal LFTs -Lipase is elevated at 307 -WBC 11.1..  Platelets 141 -Potassium 3.9 with creatinine 0.76   Review of systems:    In addition to the HPI above,   A full Review of  Systems was done, all other systems reviewed are negative except as noted above in HPI , .    Social History:  Reviewed by me    Social History   Tobacco Use   Smoking status: Former    Packs/day: 1.00    Years: 30.00    Additional pack years: 0.00    Total pack years: 30.00    Types: Cigarettes, E-cigarettes    Quit date: 10/03/2015    Years since quitting: 7.0    Passive exposure: Past   Smokeless tobacco: Never   Tobacco comments:    stoped vaping 2018  Substance Use Topics   Alcohol use: Yes    Alcohol/week: 5.0 standard drinks of alcohol    Types: 5 Glasses of  wine per week     Family History :  Reviewed by me  Family History  Problem Relation Age of Onset   Diabetes Mother    Heart disease Father    Heart disease Brother    Cancer Neg Hx    Colon cancer Neg Hx    Colon polyps Neg Hx    Neuropathy Neg Hx      Home Medications:   Prior to Admission medications   Medication Sig Start Date End Date Taking? Authorizing Provider  acetaminophen (TYLENOL) 500 MG tablet Take 2 tablets (1,000 mg total) by mouth every 6 (six) hours as needed for mild pain. 08/21/20  Yes Meuth, Brooke A, PA-C  albuterol (PROVENTIL) (2.5 MG/3ML) 0.083% nebulizer solution Take 2.5 mg by nebulization every 6 (six) hours as needed for wheezing or shortness of breath.   Yes [provider]  albuterol (VENTOLIN HFA) 108 (90 Base) MCG/ACT inhaler Inhale 2 puffs into the lungs every 6 (six) hours as needed for wheezing or shortness of breath.   Yes [provider]  allopurinol (ZYLOPRIM) 300 MG tablet Take 300 mg by mouth daily. 09/17/20  Yes [provider]  amoxicillin (AMOXIL) 500 MG capsule Take 4 capsules by mouth once. Take 1 hour before dentist appt. 10/06/22  Yes [provider]  doxycycline (VIBRAMYCIN) 50 MG capsule Take 50 mg by mouth daily. 01/15/22  Yes [provider]  ipratropium-albuterol (DUONEB) 0.5-2.5 (3) MG/3ML SOLN Take 3 mLs by nebulization 4 (four) times daily. 08/09/22  Yes [provider]  nitroGLYCERIN (NITROSTAT) 0.4 MG SL tablet Place 1 tablet (0.4 mg total) under the tongue every 5 (five) minutes as needed for chest pain. 07/29/22 07/29/23 Yes Shah, Pratik D, DO  NON FORMULARY Pt uses a cpap nightly   Yes [provider]  pantoprazole (PROTONIX) 40 MG tablet Take 1 tablet (40 mg total) by mouth daily. 04/29/22 10/26/22 Yes Rourk, [name], MD  pregabalin (LYRICA) 50 MG capsule Take 50 mg by mouth 3 (three) times daily. 09/16/22  Yes [provider]  rosuvastatin (CRESTOR) 20 MG tablet  TAKE 1 TABLET(20 MG) BY MOUTH DAILY Patient taking differently: Take 20 mg by mouth daily. 04/22/22  Yes Richardo Priest, MD  TRELEGY ELLIPTA 100-62.5-25 MCG/ACT AEPB INHALE 1 PUFF INTO THE LUNGS DAILY 07/08/22  Yes Icard, Leory Plowman L, DO  warfarin (COUMADIN) 2 MG tablet Take 1 tablet (2 mg total) by mouth daily. Patient taking differently: Take 1 mg by mouth daily. Alternate 2mg  every other day and 1 mg every other day 07/25/22   Mallipeddi, Vishnu P, MD     Allergies:    No Known Allergies   Physical Exam:   Vitals  Blood pressure (!) 164/103, pulse 91, temperature 97.6 F (36.4 C), temperature source Oral, resp. rate 20, height 6\' 1"  (1.854 m), weight 104.3 kg, SpO2 90 %.  Physical Examination: General appearance - alert,  in no distress  Mental status - alert, oriented to person, place, and time,  Eyes - sclera anicteric Neck - supple, no JVD elevation , Chest - clear  to auscultation bilaterally, symmetrical air movement,  Heart - S1 and S2 normal, regular  Abdomen - soft,  nondistended, +BS, epigastric and left upper quadrant tenderness without rebound or guarding Neurological - screening mental status exam normal, neck supple without rigidity, cranial nerves II through XII intact, DTR's normal and symmetric Extremities - no pedal edema noted, intact peripheral pulses  Skin - warm, dry   Data Review:    CBC Recent Labs  Lab 10/26/22 0930  WBC 11.1*  HGB 16.9  HCT 49.4  PLT 141*  MCV 93.0  MCH 31.8  MCHC 34.2  RDW 13.3  LYMPHSABS 1.6  MONOABS 0.7  EOSABS 0.0  BASOSABS 0.0   ------------------------------------------------------------------------------------------------------------------  Chemistries  Recent Labs  Lab 10/26/22 0930 10/26/22 0935  NA 138  --   K 3.9  --   CL 103  --   CO2 25  --   GLUCOSE 168*  --   BUN 11  --   CREATININE 0.76  --   CALCIUM 8.6*  --   AST  --  56*  ALT  --  44  ALKPHOS  --  84  BILITOT  --  0.7    ------------------------------------------------------------------------------------------------------------------ estimated creatinine clearance is 116.8 mL/min (by C-G formula based on SCr of 0.76 mg/dL). ------------------------------------------------------------------------------------------------------------------ Coagulation profile Recent Labs  Lab 10/20/22 0816 10/26/22 0935  INR 2.1 1.9*   Urinalysis    Component Value Date/Time   COLORURINE YELLOW 10/26/2022 1035   APPEARANCEUR CLEAR 10/26/2022 1035   APPEARANCEUR Clear 12/10/2021 1557   LABSPEC >1.046 (H) 10/26/2022 1035   PHURINE 7.0 10/26/2022 1035   GLUCOSEU NEGATIVE 10/26/2022 1035   HGBUR NEGATIVE 10/26/2022 1035   BILIRUBINUR NEGATIVE 10/26/2022 1035   BILIRUBINUR Negative 12/10/2021 1557   KETONESUR NEGATIVE 10/26/2022 1035   PROTEINUR NEGATIVE 10/26/2022 1035   UROBILINOGEN negative (A) 01/17/2020 1422   UROBILINOGEN 0.2 01/30/2012 0120   NITRITE NEGATIVE 10/26/2022 1035   LEUKOCYTESUR NEGATIVE 10/26/2022 1035    Imaging Results:    CT Angio Chest/Abd/Pel for Dissection W and/or Wo Contrast  Result Date: 10/26/2022 CLINICAL DATA:  Acute aortic syndrome. EXAM: CT ANGIOGRAPHY CHEST, ABDOMEN AND PELVIS TECHNIQUE: Non-contrast CT of the chest was initially obtained. Multidetector CT imaging through the chest, abdomen and pelvis was performed using the standard protocol during bolus administration of intravenous contrast. Multiplanar reconstructed images and MIPs were obtained and reviewed to evaluate the vascular anatomy. RADIATION DOSE REDUCTION: This exam was performed according to the departmental dose-optimization program which includes automated exposure control, adjustment of the mA and/or kV according to patient size and/or use of iterative reconstruction technique. CONTRAST:  120mL OMNIPAQUE IOHEXOL 350 MG/ML SOLN COMPARISON:  CT chest lung cancer screening 08/18/2022. Abdomen and pelvis CT 12/18/2021  FINDINGS: CTA CHEST FINDINGS Cardiovascular: Heart size upper normal. No pericardial effusion. Coronary artery calcification is evident. Mild atherosclerotic calcification is noted in the wall of the thoracic aorta. Pre contrast imaging shows no hyperdense crescent in the wall of the thoracic aorta to suggest the presence of an acute intramural hematoma. No dissection of the thoracic aorta. Mediastinum/Nodes: No mediastinal lymphadenopathy. No evidence for gross hilar lymphadenopathy although assessment is limited by the lack of intravenous contrast on the current study. The esophagus has normal imaging features. Small hiatal hernia evident. There is no axillary lymphadenopathy. Lungs/Pleura: Centrilobular and paraseptal emphysema evident. 9 mm stable irregular nodular opacity in the right upper lobe anteriorly is unchanged, characterized as benign on recent screening CT. No suspicious pulmonary nodule or mass. No focal airspace consolidation. No pleural effusion. Musculoskeletal: No worrisome lytic or sclerotic osseous abnormality. Review of the MIP images confirms the above findings. CTA ABDOMEN AND PELVIS FINDINGS VASCULAR Aorta: Normal caliber aorta without aneurysm, dissection, vasculitis or significant stenosis. Celiac: Patent without evidence of aneurysm, dissection, vasculitis or significant stenosis. SMA: Patent without evidence of aneurysm, dissection, vasculitis or significant stenosis. Renals: Both renal arteries are patent without evidence of aneurysm, dissection, vasculitis, fibromuscular dysplasia or significant stenosis. IMA: Patent without  evidence of aneurysm, dissection, vasculitis or significant stenosis. Inflow: Patent without evidence of aneurysm, dissection, vasculitis or significant stenosis. Veins: No obvious venous abnormality within the limitations of this arterial phase study. Review of the MIP images confirms the above findings. NON-VASCULAR Hepatobiliary: The liver shows diffusely  decreased attenuation suggesting fat deposition. Stable atrophy left hepatic lobe. Gallbladder is distended. No intrahepatic or extrahepatic biliary dilation. Pancreas: Heterogeneous enhancement noted in the head of the pancreas which is enlarged with a focal area of hypodensity measuring 1.7 x 1.6 cm on 124/6. Parenchymal calcification noted in the head and tail of pancreas, associated with mild prominence of the main pancreatic duct. There is diffuse mild peripancreatic edema/inflammation. Spleen: No splenomegaly. No focal mass lesion. Adrenals/Urinary Tract: No adrenal nodule or mass. Small bilateral renal cysts with benign features similar to prior. No followup imaging is recommended. No evidence for hydroureter. Bladder partially obscured by beam hardening artifact from bilateral hip replacement. Stomach/Bowel: Tiny hiatal hernia. Stomach otherwise unremarkable. Periduodenal edema evident, likely secondary. No small bowel wall thickening. No small bowel dilatation. The terminal ileum is normal. The appendix is not well visualized, but there is no edema or inflammation in the region of the cecum. No gross colonic mass. No colonic wall thickening. Diverticuli are seen scattered along the entire length of the colon without CT findings of diverticulitis. Lymphatic: Scattered small peripancreatic lymph nodes are evident. No retroperitoneal lymphadenopathy. No pelvic sidewall lymphadenopathy. 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 Other: Small fluid collection identified adjacent to the rectum (image 197/6). Musculoskeletal: Bilateral hip replacement. No worrisome lytic or sclerotic osseous abnormality. Review of the MIP images confirms the above findings. IMPRESSION: 1. No evidence for acute intramural hematoma or dissection of the thoracoabdominal aorta. No thoracoabdominal aortic aneurysm 2. Heterogeneous enhancement in the head of the pancreas which is enlarged with a focal area of  hypodensity measuring 1.7 x 1.6 cm. There is diffuse mild peripancreatic edema/inflammation. Imaging features suggest acute on chronic pancreatitis. Given the somewhat focal low-density in the head of the pancreas, follow-up MRI of the abdomen with and without contrast recommended to exclude underlying mass lesion. 3. Periduodenal edema, likely secondary. 4. Hepatic steatosis with stable atrophy left hepatic lobe. 5. Small hiatal hernia. 6. Diffuse colonic diverticulosis without diverticulitis. Small apparent extraperitoneal fluid collection identified adjacent to the rectum, new in the interval and nonspecific. Follow-up CT of the pelvis in 3 months recommended to ensure stability. 7.  Emphysema (ICD10-J43.9) and Aortic Atherosclerosis (ICD10-170.0) Electronically Signed   By: Misty Stanley M.D.   On: 10/26/2022 10:24    Radiological Exams on Admission: CT Angio Chest/Abd/Pel for Dissection W and/or Wo Contrast  Result Date: 10/26/2022 CLINICAL DATA:  Acute aortic syndrome. EXAM: CT ANGIOGRAPHY CHEST, ABDOMEN AND PELVIS TECHNIQUE: Non-contrast CT of the chest was initially obtained. Multidetector CT imaging through the chest, abdomen and pelvis was performed using the standard protocol during bolus administration of intravenous contrast. Multiplanar reconstructed images and MIPs were obtained and reviewed to evaluate the vascular anatomy. RADIATION DOSE REDUCTION: This exam was performed according to the departmental dose-optimization program which includes automated exposure control, adjustment of the mA and/or kV according to patient size and/or use of iterative reconstruction technique. CONTRAST:  138mL OMNIPAQUE IOHEXOL 350 MG/ML SOLN COMPARISON:  CT chest lung cancer screening 08/18/2022. Abdomen and pelvis CT 12/18/2021 FINDINGS: CTA CHEST FINDINGS Cardiovascular: Heart size upper normal. No pericardial effusion. Coronary artery calcification is evident. Mild atherosclerotic calcification is noted in the  wall of the  thoracic aorta. Pre contrast imaging shows no hyperdense crescent in the wall of the thoracic aorta to suggest the presence of an acute intramural hematoma. No dissection of the thoracic aorta. Mediastinum/Nodes: No mediastinal lymphadenopathy. No evidence for gross hilar lymphadenopathy although assessment is limited by the lack of intravenous contrast on the current study. The esophagus has normal imaging features. Small hiatal hernia evident. There is no axillary lymphadenopathy. Lungs/Pleura: Centrilobular and paraseptal emphysema evident. 9 mm stable irregular nodular opacity in the right upper lobe anteriorly is unchanged, characterized as benign on recent screening CT. No suspicious pulmonary nodule or mass. No focal airspace consolidation. No pleural effusion. Musculoskeletal: No worrisome lytic or sclerotic osseous abnormality. Review of the MIP images confirms the above findings. CTA ABDOMEN AND PELVIS FINDINGS VASCULAR Aorta: Normal caliber aorta without aneurysm, dissection, vasculitis or significant stenosis. Celiac: Patent without evidence of aneurysm, dissection, vasculitis or significant stenosis. SMA: Patent without evidence of aneurysm, dissection, vasculitis or significant stenosis. Renals: Both renal arteries are patent without evidence of aneurysm, dissection, vasculitis, fibromuscular dysplasia or significant stenosis. IMA: Patent without evidence of aneurysm, dissection, vasculitis or significant stenosis. Inflow: Patent without evidence of aneurysm, dissection, vasculitis or significant stenosis. Veins: No obvious venous abnormality within the limitations of this arterial phase study. Review of the MIP images confirms the above findings. NON-VASCULAR Hepatobiliary: The liver shows diffusely decreased attenuation suggesting fat deposition. Stable atrophy left hepatic lobe. Gallbladder is distended. No intrahepatic or extrahepatic biliary dilation. Pancreas: Heterogeneous  enhancement noted in the head of the pancreas which is enlarged with a focal area of hypodensity measuring 1.7 x 1.6 cm on 124/6. Parenchymal calcification noted in the head and tail of pancreas, associated with mild prominence of the main pancreatic duct. There is diffuse mild peripancreatic edema/inflammation. Spleen: No splenomegaly. No focal mass lesion. Adrenals/Urinary Tract: No adrenal nodule or mass. Small bilateral renal cysts with benign features similar to prior. No followup imaging is recommended. No evidence for hydroureter. Bladder partially obscured by beam hardening artifact from bilateral hip replacement. Stomach/Bowel: Tiny hiatal hernia. Stomach otherwise unremarkable. Periduodenal edema evident, likely secondary. No small bowel wall thickening. No small bowel dilatation. The terminal ileum is normal. The appendix is not well visualized, but there is no edema or inflammation in the region of the cecum. No gross colonic mass. No colonic wall thickening. Diverticuli are seen scattered along the entire length of the colon without CT findings of diverticulitis. Lymphatic: Scattered small peripancreatic lymph nodes are evident. No retroperitoneal lymphadenopathy. No pelvic sidewall lymphadenopathy. 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 Other: Small fluid collection identified adjacent to the rectum (image 197/6). Musculoskeletal: Bilateral hip replacement. No worrisome lytic or sclerotic osseous abnormality. Review of the MIP images confirms the above findings. IMPRESSION: 1. No evidence for acute intramural hematoma or dissection of the thoracoabdominal aorta. No thoracoabdominal aortic aneurysm 2. Heterogeneous enhancement in the head of the pancreas which is enlarged with a focal area of hypodensity measuring 1.7 x 1.6 cm. There is diffuse mild peripancreatic edema/inflammation. Imaging features suggest acute on chronic pancreatitis. Given the somewhat focal low-density in  the head of the pancreas, follow-up MRI of the abdomen with and without contrast recommended to exclude underlying mass lesion. 3. Periduodenal edema, likely secondary. 4. Hepatic steatosis with stable atrophy left hepatic lobe. 5. Small hiatal hernia. 6. Diffuse colonic diverticulosis without diverticulitis. Small apparent extraperitoneal fluid collection identified adjacent to the rectum, new in the interval and nonspecific. Follow-up CT of the pelvis in 3 months  recommended to ensure stability. 7.  Emphysema (ICD10-J43.9) and Aortic Atherosclerosis (ICD10-170.0) Electronically Signed   By: Misty Stanley M.D.   On: 10/26/2022 10:24    DVT Prophylaxis -SCD/Coumadin AM Labs Ordered, also please review Full Orders  Family Communication: Admission, patients condition and plan of care including tests being ordered have been discussed with the patient and wife who indicate understanding and agree with the plan   Condition  -stable  Roxan Hockey M.D on 10/26/2022 at 6:03 PM Go to [email or web address] -  for contact info  [facility] - Office  [phone]

## 2022-10-26 NOTE — Progress Notes (Signed)
ANTICOAGULATION CONSULT NOTE - Initial Consult  Pharmacy Consult for Warfarin Indication: h/o DVT  No Known Allergies  Patient Measurements: Height: 6\' 1"  (185.4 cm) Weight: 104.3 kg (230 lb) IBW/kg (Calculated) : 79.9  Vital Signs: Temp: 97.6 F (36.4 C) (03/24 1322) Temp Source: Oral (03/24 1322) BP: 164/103 (03/24 1530) Pulse Rate: 91 (03/24 1322)  Labs: Recent Labs    10/26/22 0930 10/26/22 0935 10/26/22 1127  HGB 16.9  --   --   HCT 49.4  --   --   PLT 141*  --   --   LABPROT  --  21.2*  --   INR  --  1.9*  --   CREATININE 0.76  --   --   TROPONINIHS 8  --  8    Estimated Creatinine Clearance: 116.8 mL/min (by C-G formula based on SCr of 0.76 mg/dL).   Medical History: Past Medical History:  Diagnosis Date   Bilateral shoulder bursitis    Chronic back pain    COPD (chronic obstructive pulmonary disease) with emphysema ([name])    DDD (degenerative disc disease), lumbar    hx epideral injection  L5--S1    Diverticulitis    DVT (deep venous thrombosis) ([place]) 11/19/2018   Dysphasia    intermittant w/ food    Dyspnea    Dyspnea on exertion    Fatty liver    GERD (gastroesophageal reflux disease)    Gout    L great toe   Heterozygous factor V Leiden mutation ([place])    Hiatal hernia    Hip problem 2020   left, seeing orthopedics   History of acute pancreatitis    alcoholic pancreatitis [id number] and 01-30-2012. 2021   History of colon polyps    10-17-2005  hyperplastic polyp's   History of esophageal stricture    s/p  dilation 02-02-2017   History of peptic ulcer 1990s   History of pneumococcal pneumonia 2006   bilateral pneumonia w/ left lower lobe abscess treated w/ chest tube with suction for drainage   History of pneumothorax    1984--  left spontaneous pneumothorax ,  treated w/ chest tube   Hypertension    Malignant neoplasm of prostate [facility]) urologist-  dr dahlstedt/  oncologist -- dr Tammi Klippel   dx 12-02-2016-- Stage T2b, Gleason 3+4,  PSA  5.99,  vol 25cc   Neuropathy    Numbness of left foot    outer left ankle numb-- per pt has appt. w/ neurologist   OSA (obstructive sleep apnea)    per pt has not used cpap over year ago from 04-16-2017--- per study 01-17-2010  severe osa   Pneumonia    Solid nodule of lung greater than 8 mm in diameter 05/11/2018   03/2018: RUL Nodule, 26mm   Wears glasses     Medications:  Medications Prior to Admission  Medication Sig Dispense Refill Last Dose   acetaminophen (TYLENOL) 500 MG tablet Take 2 tablets (1,000 mg total) by mouth every 6 (six) hours as needed for mild pain. 30 tablet 0 unknown   albuterol (PROVENTIL) (2.5 MG/3ML) 0.083% nebulizer solution Take 2.5 mg by nebulization every 6 (six) hours as needed for wheezing or shortness of breath.   Past Week   albuterol (VENTOLIN HFA) 108 (90 Base) MCG/ACT inhaler Inhale 2 puffs into the lungs every 6 (six) hours as needed for wheezing or shortness of breath.   unknown   allopurinol (ZYLOPRIM) 300 MG tablet Take 300 mg by mouth daily.   10/25/2022  amoxicillin (AMOXIL) 500 MG capsule Take 4 capsules by mouth once. Take 1 hour before dentist appt.   unknown   doxycycline (VIBRAMYCIN) 50 MG capsule Take 50 mg by mouth daily.   10/25/2022   ipratropium-albuterol (DUONEB) 0.5-2.5 (3) MG/3ML SOLN Take 3 mLs by nebulization 4 (four) times daily.   unknown   nitroGLYCERIN (NITROSTAT) 0.4 MG SL tablet Place 1 tablet (0.4 mg total) under the tongue every 5 (five) minutes as needed for chest pain. 100 tablet 3 10/26/2022   NON FORMULARY Pt uses a cpap nightly   10/25/2022   pantoprazole (PROTONIX) 40 MG tablet Take 1 tablet (40 mg total) by mouth daily. 30 tablet 11 10/25/2022   pregabalin (LYRICA) 50 MG capsule Take 50 mg by mouth 3 (three) times daily.   10/25/2022   rosuvastatin (CRESTOR) 20 MG tablet TAKE 1 TABLET(20 MG) BY MOUTH DAILY (Patient taking differently: Take 20 mg by mouth daily.) 90 tablet 3 10/25/2022   TRELEGY ELLIPTA 100-62.5-25 MCG/ACT  AEPB INHALE 1 PUFF INTO THE LUNGS DAILY 60 each 5 10/25/2022   warfarin (COUMADIN) 2 MG tablet Take 1 tablet (2 mg total) by mouth daily. (Patient taking differently: Take 1 mg by mouth daily. Alternate 2mg  every other day and 1 mg every other day) 90 tablet 3 10/24/2022 at PM    Assessment: 67 y.o. M presents with CP. Pt on warfarin PTA for h/o DVT. Admission INR 1.9. CBC ok on admission. Home dose: 1mg  daily except for 2mg  tues/thu/sun from clinic notes 3/18  Goal of Therapy:  INR 2-3 Monitor platelets by anticoagulation protocol: Yes   Plan:  Daily INR Warfarin 2mg  po tonight  Sherlon Handing, PharmD, BCPS Please see amion for complete clinical pharmacist phone list 10/26/2022,6:05 PM

## 2022-10-26 NOTE — ED Triage Notes (Signed)
Pt c/o chest pain, epigastric pain, n/v, since yesterday. Pt tried Pepto bismol and vomited right back up. Pt took nitro tabs x 3 with no help. Pt has abd aneurysm and hernia.

## 2022-10-26 NOTE — ED Notes (Signed)
EKG done and seen by Dr Zammit 

## 2022-10-27 DIAGNOSIS — K852 Alcohol induced acute pancreatitis without necrosis or infection: Secondary | ICD-10-CM | POA: Diagnosis not present

## 2022-10-27 DIAGNOSIS — D6851 Activated protein C resistance: Secondary | ICD-10-CM | POA: Diagnosis not present

## 2022-10-27 DIAGNOSIS — F1010 Alcohol abuse, uncomplicated: Secondary | ICD-10-CM | POA: Diagnosis not present

## 2022-10-27 LAB — CBC
HCT: 47.8 % (ref 39.0–52.0)
Hemoglobin: 16.3 g/dL (ref 13.0–17.0)
MCH: 31.7 pg (ref 26.0–34.0)
MCHC: 34.1 g/dL (ref 30.0–36.0)
MCV: 92.8 fL (ref 80.0–100.0)
Platelets: 126 10*3/uL — ABNORMAL LOW (ref 150–400)
RBC: 5.15 MIL/uL (ref 4.22–5.81)
RDW: 13.3 % (ref 11.5–15.5)
WBC: 13.3 10*3/uL — ABNORMAL HIGH (ref 4.0–10.5)
nRBC: 0 % (ref 0.0–0.2)

## 2022-10-27 LAB — COMPREHENSIVE METABOLIC PANEL
ALT: 35 U/L (ref 0–44)
AST: 45 U/L — ABNORMAL HIGH (ref 15–41)
Albumin: 3.5 g/dL (ref 3.5–5.0)
Alkaline Phosphatase: 68 U/L (ref 38–126)
Anion gap: 11 (ref 5–15)
BUN: 10 mg/dL (ref 8–23)
CO2: 24 mmol/L (ref 22–32)
Calcium: 8.2 mg/dL — ABNORMAL LOW (ref 8.9–10.3)
Chloride: 100 mmol/L (ref 98–111)
Creatinine, Ser: 0.65 mg/dL (ref 0.61–1.24)
GFR, Estimated: >60 mL/min (ref >60)
Glucose, Bld: 150 mg/dL — ABNORMAL HIGH (ref 70–99)
Potassium: 3.6 mmol/L (ref 3.5–5.1)
Sodium: 135 mmol/L (ref 135–145)
Total Bilirubin: 0.9 mg/dL (ref 0.3–1.2)
Total Protein: 6.3 g/dL — ABNORMAL LOW (ref 6.5–8.1)

## 2022-10-27 LAB — GLUCOSE, CAPILLARY
Glucose-Capillary: 150 mg/dL — ABNORMAL HIGH (ref 70–99)
Glucose-Capillary: 142 mg/dL — ABNORMAL HIGH (ref 70–99)

## 2022-10-27 LAB — PROTIME-INR
INR: 1.9 — ABNORMAL HIGH (ref 0.8–1.2)
Prothrombin Time: 22 seconds — ABNORMAL HIGH (ref 11.4–15.2)

## 2022-10-27 MED ORDER — WARFARIN SODIUM 2 MG PO TABS
2.0000 mg | ORAL_TABLET | Freq: Once | ORAL | Status: AC
  Administered 2022-10-27: 2 mg via ORAL
  Filled 2022-10-27: qty 1

## 2022-10-27 MED ORDER — ACETAMINOPHEN 325 MG PO TABS: 650.0000 mg | ORAL_TABLET | Freq: Four times a day (QID) | ORAL | Status: DC | PRN

## 2022-10-27 MED ORDER — PANTOPRAZOLE SODIUM 40 MG IV SOLR
40.0000 mg | Freq: Two times a day (BID) | INTRAVENOUS | Status: DC
  Administered 2022-10-27 – 2022-10-28 (×4): 40 mg via INTRAVENOUS
  Filled 2022-10-27 (×4): qty 10

## 2022-10-27 MED ORDER — LACTATED RINGERS IV SOLN: INTRAVENOUS | Status: DC

## 2022-10-27 NOTE — Progress Notes (Addendum)
PROGRESS NOTE  [name] [id number] DOB: 1956/03/01 DOA: 10/26/2022 PCP: [name], MD  Brief History:   67 y.o. 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  WBC 11.1, hemoglobin 16.9, platelets 1-26,000.  Lipase 307, AST 56, ALT 44.  CTA of the abdomen and pelvis and chest were negative for any acute intramural hematoma or dissection of the thoracoabdominal aorta.  There was heterogenous enhancement of the head of the pancreas measuring 1.6 x 1.7 cm.  There is diffuse peripancreatic edema and inflammation.  There is periduodenal edema.   Assessment/Plan:  acute alcoholic pancreatitis  -CTA chest abdomen pelvis suggest acute on chronic pancreatitis--as discussed above -MRI as oupt after resolution of pancreatitis r/o pancreatic head mass -Lipase 307 -IV fluids- -As needed pain medications -advance to clears -Iv Protonix for presumed reactive duodenitis   alcohol abuse--prior history of DTs -Not interested in alcohol cessation at this time -Folic acid and thiamine as ordered -Lorazepam per CIWA protocol   history of DVT/PE/factor V Leyden deficiency--- INR 1.9 = Patient missed Coumadin on 10/25/2022 due to abdominal pain and vomiting -Pharmacy consult for Coumadin therapy   Thrombocytopenia---  -leukocytosis is probably reactive in setting of acute pancreatitis -Thrombocytopenia is due to combination of direct toxic effect of alcohol on bone marrow as  well as alcoholic hepatitis/steatosis -No bleeding concerns at this time, watch Hgb platelets and INR closely   Alcoholic Hepatitis-- - isolated elevation of AST noted, other LFTs are WNL -CT abdomen shows Hepatic steatosis with stable atrophy left hepatic lobe -alcohol cessation advised, patient is not ready to quit -Avoid hepatotoxic agents   HTN--- elevated BP noted most likely due to acute pain secondary to acute pancreatitis -pt previously on anti-HTN meds at home--stopped 2-3 months ago -Amlodipine 5 mg daily -IV labetalol as needed   7)Class 1 Obesity/OSA -Low calorie diet, portion control and increase physical activity discussed with patient -Body mass index is 30.34 kg/m. -CPAP nightly   COPD/tobacco abuse----no acute exacerbation -Bronchodilators as ordered     Family Communication:  no Family at bedside  Consultants:  none  Code Status:  FULL   DVT Prophylaxis:  [place] Heparin / [place] Lovenox   Procedures: As Listed in Progress Note Above  Antibiotics: None       Subjective: Patient states that his abdominal pain is about 50% better.  He is feels nauseous but states that emesis has improved.  He denies any fevers, chills, chest pain, shortness of breath, hemoptysis, hematemesis, melena, hematochezia.  There is no dysuria.  Objective: Vitals:   10/27/22 0545 10/27/22 0736 10/27/22 0738 10/27/22 0742  BP: (!) 182/102  (!) 170/100 (!) 170/100  Pulse: 95 (!) 113 99 99  Resp:  20 20 20   Temp: 98.1 F (36.7 C)  98.2 F (36.8 C) 98.2 F (36.8 C)  TempSrc: Oral   Oral  SpO2: 90% 93%  92%  Weight:      Height:       No  intake or output data in the 24 hours ending 10/27/22 0902 Weight change:  Exam:  General:  Pt is alert, follows commands appropriately, not in acute distress HEENT: No icterus, No thrush, No neck mass, [place]/AT Cardiovascular: RRR, S1/S2, no rubs, no gallops Respiratory: Diminished breath sounds.  Bibasilar rales but no  wheezing. Abdomen: Soft/+BS, non tender, non distended, no guarding Extremities: No edema, No lymphangitis, No petechiae, No rashes, no synovitis   Data Reviewed: I have personally reviewed following labs and imaging studies Basic Metabolic Panel: Recent Labs  Lab 10/26/22 0930 10/27/22 0500  NA 138 135  K 3.9 3.6  CL 103 100  CO2 25 24  GLUCOSE 168* 150*  BUN 11 10  CREATININE 0.76 0.65  CALCIUM 8.6* 8.2*   Liver Function Tests: Recent Labs  Lab 10/26/22 0935 10/27/22 0500  AST 56* 45*  ALT 44 35  ALKPHOS 84 68  BILITOT 0.7 0.9  PROT 7.0 6.3*  ALBUMIN 3.7 3.5   Recent Labs  Lab 10/26/22 0930  LIPASE 307*   No results for input(s): "AMMONIA" in the last 168 hours. Coagulation Profile: Recent Labs  Lab 10/26/22 0935 10/27/22 0500  INR 1.9* 1.9*   CBC: Recent Labs  Lab 10/26/22 0930 10/27/22 0500  WBC 11.1* 13.3*  NEUTROABS 8.7*  --   HGB 16.9 16.3  HCT 49.4 47.8  MCV 93.0 92.8  PLT 141* 126*   Cardiac Enzymes: No results for input(s): "CKTOTAL", "CKMB", "CKMBINDEX", "TROPONINI" in the last 168 hours. BNP: Invalid input(s): "POCBNP" CBG: Recent Labs  Lab 10/26/22 1159 10/26/22 2144 10/27/22 0542  GLUCAP 161* 138* 150*   HbA1C: No results for input(s): "HGBA1C" in the last 72 hours. Urine analysis:    Component Value Date/Time   COLORURINE YELLOW 10/26/2022 1035   APPEARANCEUR CLEAR 10/26/2022 1035   APPEARANCEUR Clear 12/10/2021 1557   LABSPEC >1.046 (H) 10/26/2022 1035   PHURINE 7.0 10/26/2022 1035   GLUCOSEU NEGATIVE 10/26/2022 1035   HGBUR NEGATIVE 10/26/2022 1035   BILIRUBINUR NEGATIVE 10/26/2022 1035   BILIRUBINUR Negative 12/10/2021 1557   KETONESUR NEGATIVE 10/26/2022 1035   PROTEINUR NEGATIVE 10/26/2022 1035   UROBILINOGEN negative (A) 01/17/2020 1422   UROBILINOGEN 0.2 01/30/2012 0120   NITRITE NEGATIVE 10/26/2022 1035   LEUKOCYTESUR NEGATIVE 10/26/2022 1035   Sepsis Labs: @LABRCNTIP (procalcitonin:4,lacticidven:4) )No  results found for this or any previous visit (from the past 240 hour(s)).   Scheduled Meds:  allopurinol  300 mg Oral Daily   amLODipine  5 mg Oral Daily   diazepam  5 mg Oral TID   fluticasone furoate-vilanterol  1 puff Inhalation Daily   And   umeclidinium bromide  1 puff Inhalation Daily   folic acid  1 mg Oral Daily   multivitamin with minerals  1 tablet Oral Daily   pantoprazole (PROTONIX) IV  40 mg Intravenous Q12H   pregabalin  50 mg Oral TID   rosuvastatin  20 mg Oral Daily   thiamine  100 mg Oral Daily   Or   thiamine  100 mg Intravenous Daily   warfarin  2 mg Oral ONCE-1600   Warfarin - Pharmacist Dosing Inpatient   Does not apply q1600   Continuous Infusions:  sodium chloride 150 mL/hr at 10/27/22 0848    Procedures/Studies: CT Angio Chest/Abd/Pel for Dissection W and/or Wo Contrast  Result Date: 10/26/2022 CLINICAL DATA:  Acute aortic syndrome. EXAM: CT ANGIOGRAPHY CHEST, ABDOMEN AND PELVIS TECHNIQUE: Non-contrast CT of the chest was initially obtained. Multidetector CT imaging through the  chest, abdomen and pelvis was performed using the standard protocol during bolus administration of intravenous contrast. Multiplanar reconstructed images and MIPs were obtained and reviewed to evaluate the vascular anatomy. RADIATION DOSE REDUCTION: This exam was performed according to the departmental dose-optimization program which includes automated exposure control, adjustment of the mA and/or kV according to patient size and/or use of iterative reconstruction technique. CONTRAST:  140mL OMNIPAQUE IOHEXOL 350 MG/ML SOLN COMPARISON:  CT chest lung cancer screening 08/18/2022. Abdomen and pelvis CT 12/18/2021 FINDINGS: CTA CHEST FINDINGS Cardiovascular: Heart size upper normal. No pericardial effusion. Coronary artery calcification is evident. Mild atherosclerotic calcification is noted in the wall of the thoracic aorta. Pre contrast imaging shows no hyperdense crescent in the wall of the  thoracic aorta to suggest the presence of an acute intramural hematoma. No dissection of the thoracic aorta. Mediastinum/Nodes: No mediastinal lymphadenopathy. No evidence for gross hilar lymphadenopathy although assessment is limited by the lack of intravenous contrast on the current study. The esophagus has normal imaging features. Small hiatal hernia evident. There is no axillary lymphadenopathy. Lungs/Pleura: Centrilobular and paraseptal emphysema evident. 9 mm stable irregular nodular opacity in the right upper lobe anteriorly is unchanged, characterized as benign on recent screening CT. No suspicious pulmonary nodule or mass. No focal airspace consolidation. No pleural effusion. Musculoskeletal: No worrisome lytic or sclerotic osseous abnormality. Review of the MIP images confirms the above findings. CTA ABDOMEN AND PELVIS FINDINGS VASCULAR Aorta: Normal caliber aorta without aneurysm, dissection, vasculitis or significant stenosis. Celiac: Patent without evidence of aneurysm, dissection, vasculitis or significant stenosis. SMA: Patent without evidence of aneurysm, dissection, vasculitis or significant stenosis. Renals: Both renal arteries are patent without evidence of aneurysm, dissection, vasculitis, fibromuscular dysplasia or significant stenosis. IMA: Patent without evidence of aneurysm, dissection, vasculitis or significant stenosis. Inflow: Patent without evidence of aneurysm, dissection, vasculitis or significant stenosis. Veins: No obvious venous abnormality within the limitations of this arterial phase study. Review of the MIP images confirms the above findings. NON-VASCULAR Hepatobiliary: The liver shows diffusely decreased attenuation suggesting fat deposition. Stable atrophy left hepatic lobe. Gallbladder is distended. No intrahepatic or extrahepatic biliary dilation. Pancreas: Heterogeneous enhancement noted in the head of the pancreas which is enlarged with a focal area of hypodensity measuring  1.7 x 1.6 cm on 124/6. Parenchymal calcification noted in the head and tail of pancreas, associated with mild prominence of the main pancreatic duct. There is diffuse mild peripancreatic edema/inflammation. Spleen: No splenomegaly. No focal mass lesion. Adrenals/Urinary Tract: No adrenal nodule or mass. Small bilateral renal cysts with benign features similar to prior. No followup imaging is recommended. No evidence for hydroureter. Bladder partially obscured by beam hardening artifact from bilateral hip replacement. Stomach/Bowel: Tiny hiatal hernia. Stomach otherwise unremarkable. Periduodenal edema evident, likely secondary. No small bowel wall thickening. No small bowel dilatation. The terminal ileum is normal. The appendix is not well visualized, but there is no edema or inflammation in the region of the cecum. No gross colonic mass. No colonic wall thickening. Diverticuli are seen scattered along the entire length of the colon without CT findings of diverticulitis. Lymphatic: Scattered small peripancreatic lymph nodes are evident. No retroperitoneal lymphadenopathy. No pelvic sidewall lymphadenopathy. 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 Other: Small fluid collection identified adjacent to the rectum (image 197/6). Musculoskeletal: Bilateral hip replacement. No worrisome lytic or sclerotic osseous abnormality. Review of the MIP images confirms the above findings. IMPRESSION: 1. No evidence for acute intramural hematoma or dissection of the thoracoabdominal aorta. No thoracoabdominal  aortic aneurysm 2. Heterogeneous enhancement in the head of the pancreas which is enlarged with a focal area of hypodensity measuring 1.7 x 1.6 cm. There is diffuse mild peripancreatic edema/inflammation. Imaging features suggest acute on chronic pancreatitis. Given the somewhat focal low-density in the head of the pancreas, follow-up MRI of the abdomen with and without contrast recommended to exclude  underlying mass lesion. 3. Periduodenal edema, likely secondary. 4. Hepatic steatosis with stable atrophy left hepatic lobe. 5. Small hiatal hernia. 6. Diffuse colonic diverticulosis without diverticulitis. Small apparent extraperitoneal fluid collection identified adjacent to the rectum, new in the interval and nonspecific. Follow-up CT of the pelvis in 3 months recommended to ensure stability. 7.  Emphysema (ICD10-J43.9) and Aortic Atherosclerosis (ICD10-170.0) Electronically Signed   By: Misty Stanley M.D.   On: 10/26/2022 10:24   US ABDOMEN RUQ W/ELASTOGRAPHY  Result Date: 10/22/2022 CLINICAL DATA:  Fatty liver.  Elevated LFTs. EXAM: US ABDOMEN LIMITED - RIGHT UPPER QUADRANT ULTRASOUND HEPATIC ELASTOGRAPHY TECHNIQUE: Sonography of the right upper quadrant was performed. In addition, ultrasound elastography evaluation of the liver was performed. A region of interest was placed within the right lobe of the liver. Following application of a compressive sonographic pulse, tissue compressibility was assessed. Multiple assessments were performed at the selected site. Median tissue compressibility was determined. Previously, hepatic stiffness was assessed by shear wave velocity. Based on recently published Society of Radiologists in Ultrasound consensus article, reporting is now recommended to be performed in the SI units of pressure (kiloPascals) representing hepatic stiffness/elasticity. The obtained result is compared to the published reference standards. (cACLD = compensated Advanced Chronic Liver Disease) COMPARISON:  CT AP 12/18/2021 FINDINGS: ULTRASOUND ABDOMEN LIMITED RIGHT UPPER QUADRANT Gallbladder: No gallstones or wall thickening visualized. No sonographic Murphy sign noted. Common bile duct: Diameter: 2.8 mm Liver: No focal lesion identified. Increased parenchymal echogenicity. Atrophy of the left hepatic lobe again seen. Portal vein is patent on color Doppler imaging with normal direction of blood  flow towards the liver. ULTRASOUND HEPATIC ELASTOGRAPHY Device: Siemens Helix VTQ Patient position: Supine Transducer DAX Number of measurements: 10 Hepatic segment:  8 Median kPa: 3.3 IQR: 1.3 IQR/Median kPa ratio: 0.39 Data quality: IQR/Median kPa ratio of 0.3 or greater indicates reduced accuracy Diagnostic category:  < or = 5 kPa: high probability of being normal The use of hepatic elastography is applicable to patients with viral hepatitis and non-alcoholic fatty liver disease. At this time, there is insufficient data for the referenced cut-off values and use in other causes of liver disease, including alcoholic liver disease. Patients, however, may be assessed by elastography and serve as their own reference standard/baseline. In patients with non-alcoholic liver disease, the values suggesting compensated advanced chronic liver disease (cACLD) may be lower, and patients may need additional testing with elasticity results of 7-9 kPa. Please note that abnormal hepatic elasticity and shear wave velocities may also be identified in clinical settings other than with hepatic fibrosis, such as: acute hepatitis, elevated right heart and central venous pressures including use of beta blockers, veno-occlusive disease (Budd-Chiari), infiltrative processes such as mastocytosis/amyloidosis/infiltrative tumor/lymphoma, extrahepatic cholestasis, with hyperemia in the post-prandial state, and with liver transplantation. Correlation with patient history, laboratory data, and clinical condition recommended. Diagnostic Categories: < or =5 kPa: high probability of being normal < or =9 kPa: in the absence of other known clinical signs, rules out cACLD >9 kPa and ?13 kPa: suggestive of cACLD, but needs further testing >13 kPa: highly suggestive of cACLD > or =17 kPa: highly suggestive  of cACLD with an increased probability of clinically significant portal hypertension IMPRESSION: ULTRASOUND RUQ: 1. Echogenic liver compatible with  hepatic steatosis. 2. Atrophy of left lobe of liver as noted on previous CT. ULTRASOUND HEPATIC ELASTOGRAPHY: Median kPa:  3.3 Diagnostic category:  < or = 5 kPa: high probability of being normal Electronically Signed   By: Kerby Moors M.D.   On: 10/22/2022 11:29    Orson Eva, DO  [facility]  If 7PM-7AM, please contact night-coverage [email or web address] Password [email or web address] 10/27/2022, 9:02 AM   LOS: 1 day

## 2022-10-27 NOTE — TOC Initial Note (Addendum)
Transition of Care [facility]) - Initial/Assessment Note    Patient Details  Name: [name] MRN: [id number] Date of Birth: [birth year]  Transition of Care [facility]) CM/SW Contact:    Boneta Lucks, RN Phone Number: 10/27/2022, 10:32 AM  Clinical Narrative:      Patient admitted with acute alcoholic pancreatitis. Patient has a high risk for readmission. Patient lives at home with his wife and drives himself to appointments. No equipment needed. He is agreeable to substance abuse resources. He is going to try AA meeting or something. Added to AVS.  Expected Discharge Plan: Home/Self Care Barriers to Discharge: Continued Medical Work up   Patient Goals and CMS Choice Patient states their goals for this hospitalization and ongoing recovery are:: to return home. CMS Medicare.gov Compare Post Acute Care list provided to:: Patient Choice offered to / list presented to : Patient     Expected Discharge Plan and Services      Living arrangements for the past 2 months: Single Family Home                    Prior Living Arrangements/Services Living arrangements for the past 2 months: Single Family Home Lives with:: Spouse Patient language and need for interpreter reviewed:: Yes        Need for Family Participation in Patient Care: Yes (Comment) Care giver support system in place?: Yes (comment)   Criminal Activity/Legal Involvement Pertinent to Current Situation/Hospitalization: No - Comment as needed  Activities of Daily Living Home Assistive Devices/Equipment: None ADL Screening (condition at time of admission) Patient's cognitive ability adequate to safely complete daily activities?: No Is the patient deaf or have difficulty hearing?: Yes Does the patient have difficulty seeing, even when wearing glasses/contacts?: Yes Does the patient have difficulty concentrating, remembering, or making decisions?: No Patient able to express need for assistance with ADLs?: Yes Does the patient  have difficulty dressing or bathing?: No Independently performs ADLs?: Yes (appropriate for developmental age) Does the patient have difficulty walking or climbing stairs?: No Weakness of Legs: None Weakness of Arms/Hands: None  Permission Sought/Granted    Emotional Assessment     Affect (typically observed): Accepting Orientation: : Oriented to Self, Oriented to Place, Oriented to  Time, Oriented to Situation Alcohol / Substance Use: Alcohol Use Psych Involvement: No (comment)  Admission diagnosis:  Pancreatitis [K85.90] Idiopathic acute pancreatitis without infection or necrosis [K85.00] Patient Active Problem List   Diagnosis Date Noted   Pancreatitis 10/26/2022   Chest pain 07/29/2022   Subtherapeutic international normalized ratio (INR) 07/29/2022   Encounter for monitoring Coumadin therapy 07/24/2022   HTN (hypertension) 07/24/2022   Diverticular disease 02/12/2022   Wears glasses 11/14/2021   Numbness of left foot 11/14/2021   History of pneumothorax 11/14/2021   History of peptic ulcer 11/14/2021   History of esophageal stricture 11/14/2021   Hiatal hernia 11/14/2021   Factor V Leiden mutation ([place]) 11/14/2021   Gout 11/14/2021   Dysphasia 11/14/2021   DDD (degenerative disc disease), lumbar 11/14/2021   Chronic back pain 11/14/2021   Bilateral shoulder bursitis 11/14/2021   LLQ pain 10/22/2021   Left groin pain 10/22/2021   Diverticulitis of colon without hemorrhage 10/22/2021   Hepatic steatosis    Diverticulitis 09/23/2021   Chronic obstructive lung disease ([place]) 08/16/2021   Obstructive sleep apnea of adult 08/16/2021   Malignant tumor of prostate ([place]) 08/16/2021   Pulmonary embolism ([place]) 08/16/2021   Acute respiratory failure with hypoxia ([place]) 08/16/2021   Hyperglycemia 08/16/2021  Hypokalemia 08/16/2021   History of DVT (deep vein thrombosis) 08/16/2021   Mixed hyperlipidemia 08/16/2021   Coronary artery disease involving native coronary artery  of native heart without angina pectoris    Neuropathy 06/13/2021   Vitamin D deficiency 06/13/2021   On long term drug therapy 06/13/2021   Bilateral carpal tunnel syndrome 10/31/2020   Carpal tunnel syndrome of right wrist 10/31/2020   Pain in right hand 10/31/2020   Skin sensation disturbance 10/31/2020   Idiopathic chronic gout, unspecified site, without tophus (tophi) 10/03/2020   Bilateral shoulder pain 10/03/2020   Diverticulitis of colon 08/10/2020   Alcohol abuse 123456   Acute alcoholic pancreatitis 123456   History of hip replacement 02/07/2019   Avascular necrosis of femur ([place]) 01/05/2019   Lumbar radiculopathy 01/05/2019   History of colonic polyps 08/19/2018   Esophageal stenosis 08/19/2018   Hip problem 2020   DVT (deep venous thrombosis) ([place]) 05/11/2018   Solid nodule of lung greater than 8 mm in diameter 05/11/2018   Abnormal CT scan, colon 04/13/2018   Obstructive sleep apnea treated with continuous positive airway pressure (CPAP) 09/29/2017   GERD (gastroesophageal reflux disease) 04/07/2017   Dysphagia 01/29/2017   Malignant neoplasm of prostate ([place]) 01/19/2017   Pancreatitis, alcoholic 0000000   Alcoholism ([place]) 11/11/2011   Tobacco abuse 11/11/2011   Thrombocytopenia ([place]) 11/11/2011   HYPERSOMNIA, ASSOCIATED WITH SLEEP APNEA 09/27/2009   COPD (chronic obstructive pulmonary disease) ([place]) 09/07/2009   Pneumothorax 09/07/2009   BURSITIS 09/07/2009   FATIGUE / MALAISE 09/07/2009   SHORTNESS OF BREATH 09/07/2009   PNEUMOTHORAX 09/07/2009   History of pneumococcal pneumonia 2006   PCP:  [name], MD Pharmacy:   [facility] [phone] - [place], [place] - [address] AT [address] [place] [address] Phone: [phone] Fax: [phone]   Social Determinants of Health (SDOH) Social History: SDOH Screenings   Food Insecurity: No Food Insecurity (10/26/2022)  Housing: Low Risk   (10/26/2022)  Transportation Needs: No Transportation Needs (10/26/2022)  Utilities: Not At Risk (10/26/2022)  Tobacco Use: Medium Risk (10/26/2022)   SDOH Interventions:  Readmission Risk Interventions     No data to display

## 2022-10-27 NOTE — Progress Notes (Signed)
Mobility Specialist Progress Note:    10/27/22 0915  Mobility  Activity Ambulated with assistance in hallway  Level of Assistance Modified independent, requires aide device or extra time  Assistive Device Other (Comment) (Handrails/ IV pole)  Distance Ambulated (ft) 200 ft  Activity Response Tolerated well  Mobility Referral Yes  $Mobility charge 1 Mobility   Pt agreeable to mobility session. Ambulated 276ft requiring ModI with handrails and IV pole for stability. During ambulation SpO2 90% on RA, halfway through SpO2 87% on RA. Took 1 standing rest break, recovered, SpO2 91% on RA. Returned pt to bed in supine position. Pt falling asleep with pulse ox reading SpO2 84% on RA. Pt recovered to SpO2 92% on 1L. Left pt with all needs met.   Royetta Crochet Mobility Specialist Please contact via Solicitor or  Rehab office at [phone]

## 2022-10-27 NOTE — Progress Notes (Signed)
Pain controlled with Fentanyl IV, PRN Oxy po. Pt c/o "burning" in stomach area that did not feel like reflux or acid related and nausea. On-call MD ordered IV Protonix and IV compazine. RN administered them, and pt was able to sleep. Pt also ambulated in room independently and in hallway x 2.

## 2022-10-27 NOTE — Hospital Course (Signed)
67 y.o. 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  WBC 11.1, hemoglobin 16.9, platelets 1-26,000.  Lipase 307, AST 56, ALT 44.  CTA of the abdomen and pelvis and chest were negative for any acute intramural hematoma or dissection of the thoracoabdominal aorta.  There was heterogenous enhancement of the head of the pancreas measuring 1.6 x 1.7 cm.  There is diffuse peripancreatic edema and inflammation.  There is periduodenal edema.

## 2022-10-27 NOTE — Progress Notes (Signed)
Patient continues to decline CPAP at this time and unit on stand by.

## 2022-10-27 NOTE — Progress Notes (Signed)
ANTICOAGULATION CONSULT NOTE -   Pharmacy Consult for Warfarin Indication: h/o DVT  No Known Allergies  Patient Measurements: Height: 6\' 1"  (185.4 cm) Weight: 104.3 kg (230 lb) IBW/kg (Calculated) : 79.9  Vital Signs: Temp: 98.2 F (36.8 C) (03/25 0742) Temp Source: Oral (03/25 0742) BP: 170/100 (03/25 0742) Pulse Rate: 99 (03/25 0742)  Labs: Recent Labs    10/26/22 0930 10/26/22 0935 10/26/22 1127 10/27/22 0500  HGB 16.9  --   --  16.3  HCT 49.4  --   --  47.8  PLT 141*  --   --  126*  LABPROT  --  21.2*  --  22.0*  INR  --  1.9*  --  1.9*  CREATININE 0.76  --   --  0.65  TROPONINIHS 8  --  8  --      Estimated Creatinine Clearance: 116.8 mL/min (by C-G formula based on SCr of 0.65 mg/dL).   Medical History: Past Medical History:  Diagnosis Date   Bilateral shoulder bursitis    Chronic back pain    COPD (chronic obstructive pulmonary disease) with emphysema ([name])    DDD (degenerative disc disease), lumbar    hx epideral injection  L5--S1    Diverticulitis    DVT (deep venous thrombosis) ([place]) 11/19/2018   Dysphasia    intermittant w/ food    Dyspnea    Dyspnea on exertion    Fatty liver    GERD (gastroesophageal reflux disease)    Gout    L great toe   Heterozygous factor V Leiden mutation ([place])    Hiatal hernia    Hip problem 2020   left, seeing orthopedics   History of acute pancreatitis    alcoholic pancreatitis 99991111 and 01-30-2012. 2021   History of colon polyps    10-17-2005  hyperplastic polyp's   History of esophageal stricture    s/p  dilation 02-02-2017   History of peptic ulcer 1990s   History of pneumococcal pneumonia 2006   bilateral pneumonia w/ left lower lobe abscess treated w/ chest tube with suction for drainage   History of pneumothorax    1984--  left spontaneous pneumothorax ,  treated w/ chest tube   Hypertension    Malignant neoplasm of prostate [facility]) urologist-  dr dahlstedt/  oncologist -- dr Tammi Klippel   dx  12-02-2016-- Stage T2b, Gleason 3+4,  PSA 5.99,  vol 25cc   Neuropathy    Numbness of left foot    outer left ankle numb-- per pt has appt. w/ neurologist   OSA (obstructive sleep apnea)    per pt has not used cpap over year ago from 04-16-2017--- per study 01-17-2010  severe osa   Pneumonia    Solid nodule of lung greater than 8 mm in diameter 05/11/2018   03/2018: RUL Nodule, 7mm   Wears glasses     Medications:  Medications Prior to Admission  Medication Sig Dispense Refill Last Dose   acetaminophen (TYLENOL) 500 MG tablet Take 2 tablets (1,000 mg total) by mouth every 6 (six) hours as needed for mild pain. 30 tablet 0 unknown   albuterol (PROVENTIL) (2.5 MG/3ML) 0.083% nebulizer solution Take 2.5 mg by nebulization every 6 (six) hours as needed for wheezing or shortness of breath.   Past Week   albuterol (VENTOLIN HFA) 108 (90 Base) MCG/ACT inhaler Inhale 2 puffs into the lungs every 6 (six) hours as needed for wheezing or shortness of breath.   unknown   allopurinol (ZYLOPRIM) 300  MG tablet Take 300 mg by mouth daily.   10/25/2022   amoxicillin (AMOXIL) 500 MG capsule Take 4 capsules by mouth once. Take 1 hour before dentist appt.   unknown   doxycycline (VIBRAMYCIN) 50 MG capsule Take 50 mg by mouth daily.   10/25/2022   ipratropium-albuterol (DUONEB) 0.5-2.5 (3) MG/3ML SOLN Take 3 mLs by nebulization 4 (four) times daily.   unknown   nitroGLYCERIN (NITROSTAT) 0.4 MG SL tablet Place 1 tablet (0.4 mg total) under the tongue every 5 (five) minutes as needed for chest pain. 100 tablet 3 10/26/2022   NON FORMULARY Pt uses a cpap nightly   10/25/2022   pantoprazole (PROTONIX) 40 MG tablet Take 1 tablet (40 mg total) by mouth daily. 30 tablet 11 10/25/2022   pregabalin (LYRICA) 50 MG capsule Take 50 mg by mouth 3 (three) times daily.   10/25/2022   rosuvastatin (CRESTOR) 20 MG tablet TAKE 1 TABLET(20 MG) BY MOUTH DAILY (Patient taking differently: Take 20 mg by mouth daily.) 90 tablet 3 10/25/2022    TRELEGY ELLIPTA 100-62.5-25 MCG/ACT AEPB INHALE 1 PUFF INTO THE LUNGS DAILY 60 each 5 10/25/2022   warfarin (COUMADIN) 2 MG tablet Take 1 tablet (2 mg total) by mouth daily. (Patient taking differently: Take 1 mg by mouth daily. Alternate 2mg  every other day and 1 mg every other day) 90 tablet 3 10/24/2022 at PM    Assessment: 67 y.o. M presents with CP. Pt on warfarin PTA for h/o DVT. Admission INR 1.9. CBC ok on admission. Home dose: 1mg  daily except for 2mg  tues/thu/sun from clinic notes 3/18  INR 1.9 CBC WNL  Goal of Therapy:  INR 2-3 Monitor platelets by anticoagulation protocol: Yes   Plan:  Warfarin 2mg  po tonight INR daily  Margot Ables, PharmD Clinical Pharmacist 10/27/2022 8:25 AM

## 2022-10-28 ENCOUNTER — Encounter (HOSPITAL_COMMUNITY): Payer: Self-pay | Admitting: Family Medicine

## 2022-10-28 DIAGNOSIS — F1010 Alcohol abuse, uncomplicated: Secondary | ICD-10-CM | POA: Diagnosis not present

## 2022-10-28 DIAGNOSIS — K852 Alcohol induced acute pancreatitis without necrosis or infection: Secondary | ICD-10-CM | POA: Diagnosis not present

## 2022-10-28 DIAGNOSIS — D6851 Activated protein C resistance: Secondary | ICD-10-CM | POA: Diagnosis not present

## 2022-10-28 LAB — GLUCOSE, CAPILLARY
Glucose-Capillary: 144 mg/dL — ABNORMAL HIGH (ref 70–99)
Glucose-Capillary: 161 mg/dL — ABNORMAL HIGH (ref 70–99)

## 2022-10-28 LAB — MAGNESIUM: Magnesium: 1.8 mg/dL (ref 1.7–2.4)

## 2022-10-28 LAB — PROTIME-INR
INR: 2.1 — ABNORMAL HIGH (ref 0.8–1.2)
Prothrombin Time: 23.6 seconds — ABNORMAL HIGH (ref 11.4–15.2)

## 2022-10-28 LAB — COMPREHENSIVE METABOLIC PANEL
ALT: 24 U/L (ref 0–44)
AST: 32 U/L (ref 15–41)
Albumin: 3 g/dL — ABNORMAL LOW (ref 3.5–5.0)
Alkaline Phosphatase: 63 U/L (ref 38–126)
Anion gap: 8 (ref 5–15)
BUN: 10 mg/dL (ref 8–23)
CO2: 26 mmol/L (ref 22–32)
Calcium: 8.4 mg/dL — ABNORMAL LOW (ref 8.9–10.3)
Chloride: 98 mmol/L (ref 98–111)
Creatinine, Ser: 0.66 mg/dL (ref 0.61–1.24)
GFR, Estimated: >60 mL/min (ref >60)
Glucose, Bld: 124 mg/dL — ABNORMAL HIGH (ref 70–99)
Potassium: 3.6 mmol/L (ref 3.5–5.1)
Sodium: 132 mmol/L — ABNORMAL LOW (ref 135–145)
Total Bilirubin: 1.2 mg/dL (ref 0.3–1.2)
Total Protein: 6.1 g/dL — ABNORMAL LOW (ref 6.5–8.1)

## 2022-10-28 LAB — CBC
HCT: 45.7 % (ref 39.0–52.0)
Hemoglobin: 15.7 g/dL (ref 13.0–17.0)
MCH: 31.8 pg (ref 26.0–34.0)
MCHC: 34.4 g/dL (ref 30.0–36.0)
MCV: 92.5 fL (ref 80.0–100.0)
Platelets: 103 10*3/uL — ABNORMAL LOW (ref 150–400)
RBC: 4.94 MIL/uL (ref 4.22–5.81)
RDW: 13.4 % (ref 11.5–15.5)
WBC: 11 10*3/uL — ABNORMAL HIGH (ref 4.0–10.5)
nRBC: 0 % (ref 0.0–0.2)

## 2022-10-28 MED ORDER — METOPROLOL TARTRATE 25 MG PO TABS: 25.0000 mg | ORAL_TABLET | Freq: Two times a day (BID) | ORAL | 1 refills | Status: DC

## 2022-10-28 MED ORDER — METOPROLOL TARTRATE 25 MG PO TABS
25.0000 mg | ORAL_TABLET | Freq: Two times a day (BID) | ORAL | Status: DC
  Administered 2022-10-28: 25 mg via ORAL
  Filled 2022-10-28: qty 1

## 2022-10-28 MED ORDER — WARFARIN SODIUM 2 MG PO TABS: 2.0000 mg | ORAL_TABLET | Freq: Once | ORAL | Status: DC

## 2022-10-28 MED ORDER — AMLODIPINE BESYLATE 5 MG PO TABS: 5.0000 mg | ORAL_TABLET | Freq: Every day | ORAL | 1 refills | Status: DC

## 2022-10-28 NOTE — Progress Notes (Addendum)
Patient being discharged home today, patients family transporting patient home. Discharge paperwork went over with patient, patient verbalized understanding. Belongings sent with patient home.

## 2022-10-28 NOTE — Progress Notes (Signed)
Mobility Specialist Progress Note:    10/28/22 1104  Mobility  Activity Ambulated with assistance in hallway  Level of Assistance Contact guard assist, steadying assist ([place].)  Assistive Device None  Distance Ambulated (ft) 200 ft  Activity Response Tolerated well  Mobility Referral Yes  $Mobility charge 1 Mobility   Pt agreeable to mobility session. Tolerated well, asx throughout. SpO2 90% on RA throughout ambulation. Took 1 standing rest break to reorient. Returned pt to room, in bed, all needs met, call bell in reach.   Royetta Crochet Mobility Specialist Please contact via Solicitor or  Rehab office at [phone]

## 2022-10-28 NOTE — Care Management Important Message (Signed)
Important Message  Patient Details  Name: [name] MRN: [id number] Date of Birth: May 26, 1956   Medicare Important Message Given:  N/A - LOS <3 / Initial given by admissions     Tommy Medal 10/28/2022, 10:43 AM

## 2022-10-28 NOTE — Progress Notes (Signed)
Patient asked about ambulating in the hallway, patient ambulated with this Probation officer. Oxygen saturation 92% at room air while ambulating. Patient reported no complaints of shortness of breath.

## 2022-10-28 NOTE — Progress Notes (Signed)
ANTICOAGULATION CONSULT NOTE -   Pharmacy Consult for Warfarin Indication: h/o DVT  No Known Allergies  Patient Measurements: Height: 6\' 1"  (185.4 cm) Weight: 104.3 kg (230 lb) IBW/kg (Calculated) : 79.9  Vital Signs: Temp: 98.2 F (36.8 C) (03/26 0503) BP: 154/95 (03/26 0503) Pulse Rate: 103 (03/26 0711)  Labs: Recent Labs    10/26/22 0930 10/26/22 0935 10/26/22 1127 10/27/22 0500 10/28/22 0444  HGB 16.9  --   --  16.3 15.7  HCT 49.4  --   --  47.8 45.7  PLT 141*  --   --  126* 103*  LABPROT  --  21.2*  --  22.0* 23.6*  INR  --  1.9*  --  1.9* 2.1*  CREATININE 0.76  --   --  0.65 0.66  TROPONINIHS 8  --  8  --   --      Estimated Creatinine Clearance: 116.8 mL/min (by C-G formula based on SCr of 0.66 mg/dL).   Medical History: Past Medical History:  Diagnosis Date   Bilateral shoulder bursitis    Chronic back pain    COPD (chronic obstructive pulmonary disease) with emphysema ([name])    DDD (degenerative disc disease), lumbar    hx epideral injection  L5--S1    Diverticulitis    DVT (deep venous thrombosis) ([place]) 11/19/2018   Dysphasia    intermittant w/ food    Dyspnea    Dyspnea on exertion    Fatty liver    GERD (gastroesophageal reflux disease)    Gout    L great toe   Heterozygous factor V Leiden mutation ([place])    Hiatal hernia    Hip problem 2020   left, seeing orthopedics   History of acute pancreatitis    alcoholic pancreatitis 99991111 and 01-30-2012. 2021   History of colon polyps    10-17-2005  hyperplastic polyp's   History of esophageal stricture    s/p  dilation 02-02-2017   History of peptic ulcer 1990s   History of pneumococcal pneumonia 2006   bilateral pneumonia w/ left lower lobe abscess treated w/ chest tube with suction for drainage   History of pneumothorax    1984--  left spontaneous pneumothorax ,  treated w/ chest tube   Hypertension    Malignant neoplasm of prostate [facility]) urologist-  dr dahlstedt/  oncologist -- dr  Tammi Klippel   dx 12-02-2016-- Stage T2b, Gleason 3+4,  PSA 5.99,  vol 25cc   Neuropathy    Numbness of left foot    outer left ankle numb-- per pt has appt. w/ neurologist   OSA (obstructive sleep apnea)    per pt has not used cpap over year ago from 04-16-2017--- per study 01-17-2010  severe osa   Pneumonia    Solid nodule of lung greater than 8 mm in diameter 05/11/2018   03/2018: RUL Nodule, 42mm   Wears glasses     Medications:  Medications Prior to Admission  Medication Sig Dispense Refill Last Dose   acetaminophen (TYLENOL) 500 MG tablet Take 2 tablets (1,000 mg total) by mouth every 6 (six) hours as needed for mild pain. 30 tablet 0 unknown   albuterol (PROVENTIL) (2.5 MG/3ML) 0.083% nebulizer solution Take 2.5 mg by nebulization every 6 (six) hours as needed for wheezing or shortness of breath.   Past Week   albuterol (VENTOLIN HFA) 108 (90 Base) MCG/ACT inhaler Inhale 2 puffs into the lungs every 6 (six) hours as needed for wheezing or shortness of breath.  unknown   allopurinol (ZYLOPRIM) 300 MG tablet Take 300 mg by mouth daily.   10/25/2022   amoxicillin (AMOXIL) 500 MG capsule Take 4 capsules by mouth once. Take 1 hour before dentist appt.   unknown   doxycycline (VIBRAMYCIN) 50 MG capsule Take 50 mg by mouth daily.   10/25/2022   ipratropium-albuterol (DUONEB) 0.5-2.5 (3) MG/3ML SOLN Take 3 mLs by nebulization 4 (four) times daily.   unknown   nitroGLYCERIN (NITROSTAT) 0.4 MG SL tablet Place 1 tablet (0.4 mg total) under the tongue every 5 (five) minutes as needed for chest pain. 100 tablet 3 10/26/2022   NON FORMULARY Pt uses a cpap nightly   10/25/2022   pantoprazole (PROTONIX) 40 MG tablet Take 1 tablet (40 mg total) by mouth daily. 30 tablet 11 10/25/2022   pregabalin (LYRICA) 50 MG capsule Take 50 mg by mouth 3 (three) times daily.   10/25/2022   rosuvastatin (CRESTOR) 20 MG tablet TAKE 1 TABLET(20 MG) BY MOUTH DAILY (Patient taking differently: Take 20 mg by mouth daily.) 90  tablet 3 10/25/2022   TRELEGY ELLIPTA 100-62.5-25 MCG/ACT AEPB INHALE 1 PUFF INTO THE LUNGS DAILY 60 each 5 10/25/2022   warfarin (COUMADIN) 2 MG tablet Take 1 tablet (2 mg total) by mouth daily. (Patient taking differently: Take 1 mg by mouth daily. Alternate 2mg  every other day and 1 mg every other day) 90 tablet 3 10/24/2022 at PM    Assessment: 67 y.o. M presents with CP. Pt on warfarin PTA for h/o DVT. Admission INR 1.9. CBC ok on admission. Home dose: 1mg  daily except for 2mg  tues/thu/sun from clinic notes 3/18  INR 1.9> 2.1 CBC WNL  Goal of Therapy:  INR 2-3 Monitor platelets by anticoagulation protocol: Yes   Plan:  Warfarin 2mg  po tonight INR daily  Margot Ables, PharmD Clinical Pharmacist 10/28/2022 8:17 AM

## 2022-10-28 NOTE — Discharge Summary (Signed)
Physician Discharge Summary   Patient: [name] MRN: [id number] DOB: [birth year]  Admit date:     10/26/2022  Discharge date: 10/28/22  Discharge Physician: Shanon Brow Greig Altergott   PCP: [name], MD   Recommendations at discharge:   Please follow up with primary care provider within 1-2 weeks  Please repeat BMP and CBC in one week   Hospital Course:  67 y.o. 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  WBC 11.1, hemoglobin 16.9, platelets 1-26,000.  Lipase 307, AST 56, ALT 44.  CTA of the abdomen and pelvis and chest were negative for any acute intramural hematoma or dissection of the thoracoabdominal aorta.  There was heterogenous enhancement of the head of the pancreas measuring 1.6 x 1.7 cm.  There is diffuse peripancreatic edema and inflammation.  There is periduodenal edema.  Assessment and Plan: acute alcoholic pancreatitis  -CTA chest abdomen pelvis suggest acute on chronic pancreatitis--as discussed above -MRI as oupt after resolution of pancreatitis r/o pancreatic head mass once recovered from pancreatitis -Lipase 307 -IV fluids- -As needed pain medications -advance to clears>>soft diet -Iv Protonix for presumed reactive duodenitis   alcohol abuse--prior history of DTs -Not interested in alcohol cessation at this time -Folic acid and thiamine as ordered -Lorazepam per CIWA protocol -no signs of withdrawal during hospitalization   history of DVT/PE/factor V Leyden  deficiency-- - INR 2.1 on day of dc = Patient missed Coumadin on 10/25/2022 due to abdominal pain and vomiting -Pharmacy consult for Coumadin therapy   Thrombocytopenia---  -leukocytosis is probably reactive in setting of acute pancreatitis -Thrombocytopenia is due to combination of direct toxic effect of alcohol on bone marrow as well as alcoholic hepatitis/steatosis -No bleeding concerns at this time, watch Hgb platelets and INR closely   Alcoholic Hepatitis-- - isolated elevation of AST noted, other LFTs are WNL -CT abdomen shows Hepatic steatosis with stable atrophy left hepatic lobe -alcohol cessation advised, patient is not ready to quit -Avoid hepatotoxic agents -improved   HTN--- elevated BP noted most likely due to acute pain secondary to acute pancreatitis -pt previously on anti-HTN meds at home--stopped 2-3 months ago -Amlodipine 5 mg daily and metoprolol started -IV labetalol as needed   7)Class 1 Obesity/OSA -Low calorie diet, portion control and increase physical activity discussed with patient -Body mass index is 30.34 kg/m. -CPAP nightly   COPD/tobacco abuse----no acute exacerbation -Bronchodilators as ordered -ambulatory pulse ox--no desaturation on RA on day of dc       Pain control - [place] Controlled Substance Reporting System database was reviewed. and patient was instructed, not to drive, operate heavy machinery, perform activities at heights, swimming or participation in water activities or provide baby-sitting services while on Pain, Sleep and Anxiety Medications; until their outpatient Physician has advised to do so again. Also recommended to not to take more than prescribed Pain, Sleep and Anxiety Medications.  Consultants: none Procedures performed: none  Disposition: Home Diet recommendation:  Cardiac diet DISCHARGE MEDICATION: Allergies as of 10/28/2022   No Known Allergies  Medication List     TAKE these medications     acetaminophen 500 MG tablet Commonly known as: TYLENOL Take 2 tablets (1,000 mg total) by mouth every 6 (six) hours as needed for mild pain.   albuterol 108 (90 Base) MCG/ACT inhaler Commonly known as: VENTOLIN HFA Inhale 2 puffs into the lungs every 6 (six) hours as needed for wheezing or shortness of breath.   albuterol (2.5 MG/3ML) 0.083% nebulizer solution Commonly known as: PROVENTIL Take 2.5 mg by nebulization every 6 (six) hours as needed for wheezing or shortness of breath.   allopurinol 300 MG tablet Commonly known as: ZYLOPRIM Take 300 mg by mouth daily.   amLODipine 5 MG tablet Commonly known as: NORVASC Take 1 tablet (5 mg total) by mouth daily. Start taking on: October 29, 2022   amoxicillin 500 MG capsule Commonly known as: AMOXIL Take 4 capsules by mouth once. Take 1 hour before dentist appt.   doxycycline 50 MG capsule Commonly known as: VIBRAMYCIN Take 50 mg by mouth daily.   ipratropium-albuterol 0.5-2.5 (3) MG/3ML Soln Commonly known as: DUONEB Take 3 mLs by nebulization 4 (four) times daily.   metoprolol tartrate 25 MG tablet Commonly known as: LOPRESSOR Take 1 tablet (25 mg total) by mouth 2 (two) times daily.   nitroGLYCERIN 0.4 MG SL tablet Commonly known as: Nitrostat Place 1 tablet (0.4 mg total) under the tongue every 5 (five) minutes as needed for chest pain.   NON FORMULARY Pt uses a cpap nightly   pantoprazole 40 MG tablet Commonly known as: PROTONIX Take 1 tablet (40 mg total) by mouth daily.   pregabalin 50 MG capsule Commonly known as: LYRICA Take 50 mg by mouth 3 (three) times daily.   rosuvastatin 20 MG tablet Commonly known as: CRESTOR TAKE 1 TABLET(20 MG) BY MOUTH DAILY What changed: See the new instructions.   Trelegy Ellipta 100-62.5-25 MCG/ACT Aepb Generic drug: Fluticasone-Umeclidin-Vilant INHALE 1 PUFF INTO THE LUNGS DAILY   warfarin 2 MG tablet Commonly known as: COUMADIN Take as directed. If you are unsure how  to take this medication, talk to your nurse or doctor. Original instructions: Take 1 tablet (2 mg total) by mouth daily. What changed:  how much to take additional instructions        Discharge Exam: Filed Weights   10/26/22 0925  Weight: 104.3 kg   HEENT:  [place]/AT, No thrush, no icterus CV:  RRR, no rub, no S3, no S4 Lung:  CTA, no wheeze, no rhonchi Abd:  soft/+BS, NT Ext:  No edema, no lymphangitis, no synovitis, no rash   Condition at discharge: stable  The results of significant diagnostics from this hospitalization (including imaging, microbiology, ancillary and laboratory) are listed below for reference.   Imaging Studies: CT Angio Chest/Abd/Pel for Dissection W and/or Wo Contrast  Result Date: 10/26/2022 CLINICAL DATA:  Acute aortic syndrome. EXAM: CT ANGIOGRAPHY CHEST, ABDOMEN AND PELVIS TECHNIQUE: Non-contrast CT of the chest was initially obtained. Multidetector CT imaging through the chest, abdomen and pelvis was performed using the standard protocol during bolus administration of intravenous contrast. Multiplanar reconstructed images and MIPs were obtained and reviewed to evaluate the vascular anatomy. RADIATION DOSE REDUCTION: This exam was performed according to the departmental dose-optimization program which includes automated exposure control, adjustment of the mA and/or kV according to patient size and/or use of iterative reconstruction technique. CONTRAST:  190mL OMNIPAQUE IOHEXOL 350 MG/ML SOLN COMPARISON:  CT chest lung cancer screening 08/18/2022. Abdomen and pelvis CT 12/18/2021 FINDINGS: CTA  CHEST FINDINGS Cardiovascular: Heart size upper normal. No pericardial effusion. Coronary artery calcification is evident. Mild atherosclerotic calcification is noted in the wall of the thoracic aorta. Pre contrast imaging shows no hyperdense crescent in the wall of the thoracic aorta to suggest the presence of an acute intramural hematoma. No dissection of the thoracic aorta.  Mediastinum/Nodes: No mediastinal lymphadenopathy. No evidence for gross hilar lymphadenopathy although assessment is limited by the lack of intravenous contrast on the current study. The esophagus has normal imaging features. Small hiatal hernia evident. There is no axillary lymphadenopathy. Lungs/Pleura: Centrilobular and paraseptal emphysema evident. 9 mm stable irregular nodular opacity in the right upper lobe anteriorly is unchanged, characterized as benign on recent screening CT. No suspicious pulmonary nodule or mass. No focal airspace consolidation. No pleural effusion. Musculoskeletal: No worrisome lytic or sclerotic osseous abnormality. Review of the MIP images confirms the above findings. CTA ABDOMEN AND PELVIS FINDINGS VASCULAR Aorta: Normal caliber aorta without aneurysm, dissection, vasculitis or significant stenosis. Celiac: Patent without evidence of aneurysm, dissection, vasculitis or significant stenosis. SMA: Patent without evidence of aneurysm, dissection, vasculitis or significant stenosis. Renals: Both renal arteries are patent without evidence of aneurysm, dissection, vasculitis, fibromuscular dysplasia or significant stenosis. IMA: Patent without evidence of aneurysm, dissection, vasculitis or significant stenosis. Inflow: Patent without evidence of aneurysm, dissection, vasculitis or significant stenosis. Veins: No obvious venous abnormality within the limitations of this arterial phase study. Review of the MIP images confirms the above findings. NON-VASCULAR Hepatobiliary: The liver shows diffusely decreased attenuation suggesting fat deposition. Stable atrophy left hepatic lobe. Gallbladder is distended. No intrahepatic or extrahepatic biliary dilation. Pancreas: Heterogeneous enhancement noted in the head of the pancreas which is enlarged with a focal area of hypodensity measuring 1.7 x 1.6 cm on 124/6. Parenchymal calcification noted in the head and tail of pancreas, associated with mild  prominence of the main pancreatic duct. There is diffuse mild peripancreatic edema/inflammation. Spleen: No splenomegaly. No focal mass lesion. Adrenals/Urinary Tract: No adrenal nodule or mass. Small bilateral renal cysts with benign features similar to prior. No followup imaging is recommended. No evidence for hydroureter. Bladder partially obscured by beam hardening artifact from bilateral hip replacement. Stomach/Bowel: Tiny hiatal hernia. Stomach otherwise unremarkable. Periduodenal edema evident, likely secondary. No small bowel wall thickening. No small bowel dilatation. The terminal ileum is normal. The appendix is not well visualized, but there is no edema or inflammation in the region of the cecum. No gross colonic mass. No colonic wall thickening. Diverticuli are seen scattered along the entire length of the colon without CT findings of diverticulitis. Lymphatic: Scattered small peripancreatic lymph nodes are evident. No retroperitoneal lymphadenopathy. No pelvic sidewall lymphadenopathy. 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 Other: Small fluid collection identified adjacent to the rectum (image 197/6). Musculoskeletal: Bilateral hip replacement. No worrisome lytic or sclerotic osseous abnormality. Review of the MIP images confirms the above findings. IMPRESSION: 1. No evidence for acute intramural hematoma or dissection of the thoracoabdominal aorta. No thoracoabdominal aortic aneurysm 2. Heterogeneous enhancement in the head of the pancreas which is enlarged with a focal area of hypodensity measuring 1.7 x 1.6 cm. There is diffuse mild peripancreatic edema/inflammation. Imaging features suggest acute on chronic pancreatitis. Given the somewhat focal low-density in the head of the pancreas, follow-up MRI of the abdomen with and without contrast recommended to exclude underlying mass lesion. 3. Periduodenal edema, likely secondary. 4. Hepatic steatosis with stable atrophy left  hepatic lobe. 5. Small hiatal hernia. 6. Diffuse colonic diverticulosis  without diverticulitis. Small apparent extraperitoneal fluid collection identified adjacent to the rectum, new in the interval and nonspecific. Follow-up CT of the pelvis in 3 months recommended to ensure stability. 7.  Emphysema (ICD10-J43.9) and Aortic Atherosclerosis (ICD10-170.0) Electronically Signed   By: Misty Stanley M.D.   On: 10/26/2022 10:24   US ABDOMEN RUQ W/ELASTOGRAPHY  Result Date: 10/22/2022 CLINICAL DATA:  Fatty liver.  Elevated LFTs. EXAM: US ABDOMEN LIMITED - RIGHT UPPER QUADRANT ULTRASOUND HEPATIC ELASTOGRAPHY TECHNIQUE: Sonography of the right upper quadrant was performed. In addition, ultrasound elastography evaluation of the liver was performed. A region of interest was placed within the right lobe of the liver. Following application of a compressive sonographic pulse, tissue compressibility was assessed. Multiple assessments were performed at the selected site. Median tissue compressibility was determined. Previously, hepatic stiffness was assessed by shear wave velocity. Based on recently published Society of Radiologists in Ultrasound consensus article, reporting is now recommended to be performed in the SI units of pressure (kiloPascals) representing hepatic stiffness/elasticity. The obtained result is compared to the published reference standards. (cACLD = compensated Advanced Chronic Liver Disease) COMPARISON:  CT AP 12/18/2021 FINDINGS: ULTRASOUND ABDOMEN LIMITED RIGHT UPPER QUADRANT Gallbladder: No gallstones or wall thickening visualized. No sonographic Murphy sign noted. Common bile duct: Diameter: 2.8 mm Liver: No focal lesion identified. Increased parenchymal echogenicity. Atrophy of the left hepatic lobe again seen. Portal vein is patent on color Doppler imaging with normal direction of blood flow towards the liver. ULTRASOUND HEPATIC ELASTOGRAPHY Device: Siemens Helix VTQ Patient position: Supine  Transducer DAX Number of measurements: 10 Hepatic segment:  8 Median kPa: 3.3 IQR: 1.3 IQR/Median kPa ratio: 0.39 Data quality: IQR/Median kPa ratio of 0.3 or greater indicates reduced accuracy Diagnostic category:  < or = 5 kPa: high probability of being normal The use of hepatic elastography is applicable to patients with viral hepatitis and non-alcoholic fatty liver disease. At this time, there is insufficient data for the referenced cut-off values and use in other causes of liver disease, including alcoholic liver disease. Patients, however, may be assessed by elastography and serve as their own reference standard/baseline. In patients with non-alcoholic liver disease, the values suggesting compensated advanced chronic liver disease (cACLD) may be lower, and patients may need additional testing with elasticity results of 7-9 kPa. Please note that abnormal hepatic elasticity and shear wave velocities may also be identified in clinical settings other than with hepatic fibrosis, such as: acute hepatitis, elevated right heart and central venous pressures including use of beta blockers, veno-occlusive disease (Budd-Chiari), infiltrative processes such as mastocytosis/amyloidosis/infiltrative tumor/lymphoma, extrahepatic cholestasis, with hyperemia in the post-prandial state, and with liver transplantation. Correlation with patient history, laboratory data, and clinical condition recommended. Diagnostic Categories: < or =5 kPa: high probability of being normal < or =9 kPa: in the absence of other known clinical signs, rules out cACLD >9 kPa and ?13 kPa: suggestive of cACLD, but needs further testing >13 kPa: highly suggestive of cACLD > or =17 kPa: highly suggestive of cACLD with an increased probability of clinically significant portal hypertension IMPRESSION: ULTRASOUND RUQ: 1. Echogenic liver compatible with hepatic steatosis. 2. Atrophy of left lobe of liver as noted on previous CT. ULTRASOUND HEPATIC  ELASTOGRAPHY: Median kPa:  3.3 Diagnostic category:  < or = 5 kPa: high probability of being normal Electronically Signed   By: Kerby Moors M.D.   On: 10/22/2022 11:29    Microbiology: Results for orders placed or performed during the hospital encounter of 07/28/22  Resp panel  by RT-PCR (RSV, Flu A&B, Covid) Anterior Nasal Swab     Status: None   Collection Time: 07/29/22 12:21 AM   Specimen: Anterior Nasal Swab  Result Value Ref Range Status   SARS Coronavirus 2 by RT PCR NEGATIVE NEGATIVE Final    Comment: (NOTE) SARS-CoV-2 target nucleic acids are NOT DETECTED.  The SARS-CoV-2 RNA is generally detectable in upper respiratory specimens during the acute phase of infection. The lowest concentration of SARS-CoV-2 viral copies this assay can detect is 138 copies/mL. A negative result does not preclude SARS-Cov-2 infection and should not be used as the sole basis for treatment or other patient management decisions. A negative result may occur with  improper specimen collection/handling, submission of specimen other than nasopharyngeal swab, presence of viral mutation(s) within the areas targeted by this assay, and inadequate number of viral copies(<138 copies/mL). A negative result must be combined with clinical observations, patient history, and epidemiological information. The expected result is Negative.  Fact Sheet for Patients:  EntrepreneurPulse.com.au  Fact Sheet for Healthcare Providers:  IncredibleEmployment.be  This test is no t yet approved or cleared by the [place] FDA and  has been authorized for detection and/or diagnosis of SARS-CoV-2 by FDA under an Emergency Use Authorization (EUA). This EUA will remain  in effect (meaning this test can be used) for the duration of the COVID-19 declaration under Section 564(b)(1) of the Act, 21 U.S.C.section 360bbb-3(b)(1), unless the authorization is terminated  or revoked sooner.        Influenza A by PCR NEGATIVE NEGATIVE Final   Influenza B by PCR NEGATIVE NEGATIVE Final    Comment: (NOTE) The Xpert Xpress SARS-CoV-2/FLU/RSV plus assay is intended as an aid in the diagnosis of influenza from Nasopharyngeal swab specimens and should not be used as a sole basis for treatment. Nasal washings and aspirates are unacceptable for Xpert Xpress SARS-CoV-2/FLU/RSV testing.  Fact Sheet for Patients: EntrepreneurPulse.com.au  Fact Sheet for Healthcare Providers: IncredibleEmployment.be  This test is not yet approved or cleared by the [place] FDA and has been authorized for detection and/or diagnosis of SARS-CoV-2 by FDA under an Emergency Use Authorization (EUA). This EUA will remain in effect (meaning this test can be used) for the duration of the COVID-19 declaration under Section 564(b)(1) of the Act, 21 U.S.C. section 360bbb-3(b)(1), unless the authorization is terminated or revoked.     Resp Syncytial Virus by PCR NEGATIVE NEGATIVE Final    Comment: (NOTE) Fact Sheet for Patients: EntrepreneurPulse.com.au  Fact Sheet for Healthcare Providers: IncredibleEmployment.be  This test is not yet approved or cleared by the [place] FDA and has been authorized for detection and/or diagnosis of SARS-CoV-2 by FDA under an Emergency Use Authorization (EUA). This EUA will remain in effect (meaning this test can be used) for the duration of the COVID-19 declaration under Section 564(b)(1) of the Act, 21 U.S.C. section 360bbb-3(b)(1), unless the authorization is terminated or revoked.  Performed at [facility], [address] [place], [place] [address]     Labs: CBC: Recent Labs  Lab 10/26/22 0930 10/27/22 0500 10/28/22 0444  WBC 11.1* 13.3* 11.0*  NEUTROABS 8.7*  --   --   HGB 16.9 16.3 15.7  HCT 49.4 47.8 45.7  MCV 93.0 92.8 92.5  PLT 141* 126* [id number]*   Basic Metabolic  Panel: Recent Labs  Lab 10/26/22 0930 10/27/22 0500 10/28/22 0444  NA 138 135 132*  K 3.9 3.6 3.6  CL 103 100 98  CO2 25 24 26  GLUCOSE 168* 150* 124*  BUN 11 10 10   CREATININE 0.76 0.65 0.66  CALCIUM 8.6* 8.2* 8.4*  MG  --   --  1.8   Liver Function Tests: Recent Labs  Lab 10/26/22 0935 10/27/22 0500 10/28/22 0444  AST 56* 45* 32  ALT 44 35 24  ALKPHOS 84 68 63  BILITOT 0.7 0.9 1.2  PROT 7.0 6.3* 6.1*  ALBUMIN 3.7 3.5 3.0*   CBG: Recent Labs  Lab 10/26/22 1159 10/26/22 2144 10/27/22 0542 10/27/22 2322 10/28/22 0502  GLUCAP 161* 138* 150* 142* 144*    Discharge time spent: greater than 30 minutes.  Signed: Orson Eva, MD [facility] 10/28/2022

## 2022-11-12 ENCOUNTER — Ambulatory Visit: Payer: PPO | Attending: Internal Medicine | Admitting: *Deleted

## 2022-11-12 DIAGNOSIS — Z5181 Encounter for therapeutic drug level monitoring: Secondary | ICD-10-CM | POA: Diagnosis not present

## 2022-11-12 DIAGNOSIS — I2609 Other pulmonary embolism with acute cor pulmonale: Secondary | ICD-10-CM | POA: Diagnosis not present

## 2022-11-12 DIAGNOSIS — Z7901 Long term (current) use of anticoagulants: Secondary | ICD-10-CM | POA: Diagnosis not present

## 2022-11-12 DIAGNOSIS — I82433 Acute embolism and thrombosis of popliteal vein, bilateral: Secondary | ICD-10-CM | POA: Diagnosis not present

## 2022-11-12 LAB — POCT INR
INR: 1.2 — AB (ref 2.0–3.0)
INR: 1.2 — AB (ref 2.0–3.0)

## 2022-11-12 NOTE — Patient Instructions (Signed)
Take warfarin 3mg  tonight and tomorrow night then increase dose to2mg  daily except 1mg  on Mondays, Wednesdays and Fridays. Recheck in 2 weeks

## 2022-11-18 ENCOUNTER — Ambulatory Visit (HOSPITAL_COMMUNITY)
Admission: RE | Admit: 2022-11-18 | Discharge: 2022-11-18 | Disposition: A | Discharge status: Home / Self Care | Payer: PPO | Source: Ambulatory Visit | Attending: Nurse Practitioner | Admitting: Nurse Practitioner

## 2022-11-18 DIAGNOSIS — M25471 Effusion, right ankle: Secondary | ICD-10-CM

## 2022-11-18 DIAGNOSIS — M25571 Pain in right ankle and joints of right foot: Secondary | ICD-10-CM | POA: Insufficient documentation

## 2022-12-01 ENCOUNTER — Ambulatory Visit: Payer: PPO | Attending: Internal Medicine | Admitting: *Deleted

## 2022-12-01 DIAGNOSIS — I82433 Acute embolism and thrombosis of popliteal vein, bilateral: Secondary | ICD-10-CM

## 2022-12-01 DIAGNOSIS — I2609 Other pulmonary embolism with acute cor pulmonale: Secondary | ICD-10-CM

## 2022-12-01 DIAGNOSIS — Z5181 Encounter for therapeutic drug level monitoring: Secondary | ICD-10-CM | POA: Diagnosis not present

## 2022-12-01 DIAGNOSIS — Z7901 Long term (current) use of anticoagulants: Secondary | ICD-10-CM | POA: Diagnosis not present

## 2022-12-01 LAB — POCT INR: INR: 1.2 — AB (ref 2.0–3.0)

## 2022-12-01 NOTE — Patient Instructions (Signed)
Increase warfarin to 1 tablet daily Recheck in 1 week

## 2022-12-08 ENCOUNTER — Ambulatory Visit: Payer: PPO | Attending: Internal Medicine | Admitting: *Deleted

## 2022-12-08 DIAGNOSIS — I2609 Other pulmonary embolism with acute cor pulmonale: Secondary | ICD-10-CM

## 2022-12-08 DIAGNOSIS — Z7901 Long term (current) use of anticoagulants: Secondary | ICD-10-CM | POA: Diagnosis not present

## 2022-12-08 DIAGNOSIS — I82433 Acute embolism and thrombosis of popliteal vein, bilateral: Secondary | ICD-10-CM | POA: Diagnosis not present

## 2022-12-08 DIAGNOSIS — Z5181 Encounter for therapeutic drug level monitoring: Secondary | ICD-10-CM | POA: Diagnosis not present

## 2022-12-08 LAB — POCT INR: INR: 1.3 — AB (ref 2.0–3.0)

## 2022-12-08 NOTE — Patient Instructions (Signed)
Start warfarin 2 tablets daily except 1 tablet on Sunday, Wednesdays and Fridays Recheck in 1 week

## 2022-12-12 ENCOUNTER — Telehealth: Payer: Self-pay

## 2022-12-12 NOTE — Telephone Encounter (Signed)
Patient needing to know if he will need to reschedule his Tuesday appointment.  He has not had any labs done prior to visit.  Please advise.  Thank You.

## 2022-12-12 NOTE — Telephone Encounter (Signed)
Return call to patient regarding labs prior to appt. Review Dr. Retta Diones last office note and their was nothing that indicated labs prior. Patient is aware that to keep office visit and no labs prior to appointment. Patient voiced understanding

## 2022-12-14 NOTE — Progress Notes (Unsigned)
Referring Provider: Gareth Morgan, MD Primary Care Physician:  Gareth Morgan, MD Primary GI Physician: Dr. Jena Gauss  Chief Complaint  Patient presents with   Follow-up    Follow up. No problems     HPI:   [name] is a 67 y.o. male presenting today with a history of  CAD, COPD, DVT/PE, Factor 5 Leiden mutation chronically anticoagulated with Coumadin, prostate cancer, pancreatitis, GERD, PUD, esophageal stricture s/p dilation, colon polyps, complicated diverticulitis with abscess s/p rectosigmoid resection July 2023, fatty liver/elevated LFTs, presenting today for follow-up.  Last seen in our office 09/29/22. GERD well controlled on Protonix 40 mg daily. No recurrent chest pain since starting Protonix. He was working on weight loss through dietary changes and going to the [facility]. Planned for RUQ [place] with elastography to evaluate elevated LFTs/fatty liver, update HFP, and continue Pantoprazole.   HFP 10/17/22 with AST 53, ALT 38. This was improved from AST 75, ALT 52, 3 months prior.    [place] with fatty liver, kPa 3.3.   He was admitted 10/26/22 with acute pancreatitis. CTA of the abdomen and pelvis and chest with  heterogeneous enhancement noted in the head of the pancreas which is enlarged with a focal area of hypodensity measuring 1.7 x 1.6 cm. Parenchymal calcification noted in the head and tail of pancreas, associated with mild prominence of the main pancreatic duct. There is diffuse mild peripancreatic edema/inflammation.  Periduodenal edema evident, likely secondary. Per HPI, he admitted to ongoing alcohol abuse. He was started on IV Protonix, IV fluids, and had clinical improvement. Recommended outpatient MRI to rule out pancreatic head mass once improved. Notably, he had slight elevation of AST at 56 on admission which is chronic, but AST normalized by 3/26.    Today:  Reports he is doing well overall.  States since his episode of pancreatitis, he has made a lot of dietary  changes.  He is eating mostly vegetables, some [place], chicken, and fish.  No red meat.  Swimming up to an hour daily and also walking 5-10 miles a day.  He has been losing quite a bit of weight with this.  He has also stopped drinking alcohol.  States he was drinking daily, excessively, but very vague about this and does not give any specifics.  Has abdominal pain has resolved.  He denies having chronic abdominal pain.  No loose stools.  No BRBPR or melena.  No heartburn symptoms.  States he actually ran out of pantoprazole about 1 week ago and has been doing well since then.  He prefers not to take pantoprazole if he does not have to.  No dysphagia.  He is actually trying to get off a lot of his medications.  He has been able to come off of blood pressure medications.  Reports he supposed to have blood work completed around the first week of June with his primary care doctor.    Past Medical History:  Diagnosis Date   Bilateral shoulder bursitis    Chronic back pain    COPD (chronic obstructive pulmonary disease) with emphysema ([name])    DDD (degenerative disc disease), lumbar    hx epideral injection  L5--S1    Diverticulitis    DVT (deep venous thrombosis) ([name]) 11/19/2018   Dysphasia    intermittant w/ food    Dyspnea    Dyspnea on exertion    Fatty liver    GERD (gastroesophageal reflux disease)    Gout    L great toe  Heterozygous factor V Leiden mutation ([name])    Hiatal hernia    Hip problem 2020   left, seeing orthopedics   History of acute pancreatitis    alcoholic pancreatitis 11-11-2011 and 01-30-2012. 2021   History of colon polyps    10-17-2005  hyperplastic polyp's   History of esophageal stricture    s/p  dilation 02-02-2017   History of peptic ulcer 1990s   History of pneumococcal pneumonia 2006   bilateral pneumonia w/ left lower lobe abscess treated w/ chest tube with suction for drainage   History of pneumothorax    1984--  left spontaneous pneumothorax  ,  treated w/ chest tube   Hypertension    Malignant neoplasm of prostate [facility]) urologist-  dr dahlstedt/  oncologist -- dr Kathrynn Running   dx 12-02-2016-- Stage T2b, Gleason 3+4,  PSA 5.99,  vol 25cc   Neuropathy    Numbness of left foot    outer left ankle numb-- per pt has appt. w/ neurologist   OSA (obstructive sleep apnea)    per pt has not used cpap over year ago from 04-16-2017--- per study 01-17-2010  severe osa   Pneumonia    Solid nodule of lung greater than 8 mm in diameter 05/11/2018   03/2018: RUL Nodule, 10mm   Wears glasses     Past Surgical History:  Procedure Laterality Date   BIOPSY  02/02/2017   Procedure: BIOPSY;  Surgeon: Corbin Ade, MD;  [facility];  Service: Endoscopy;;  duodenal bx's, esophgeal bx's   BIOPSY  07/30/2017   Procedure: BIOPSY;  Surgeon: Corbin Ade, MD;  [facility];  Service: Endoscopy;;  esophagus   BIOPSY  02/15/2021   Procedure: BIOPSY;  Surgeon: Corbin Ade, MD;  [facility];  Service: Endoscopy;;   CARDIOVASCULAR STRESS TEST  09/12/2009   normal nuclear perfusion study w/ no ischemia /  normal LV function and wall motion , ef 57%   COLONOSCOPY  2007   Dr. Jena Gauss: hyperplastic polyps, internal hemorrhoids    COLONOSCOPY WITH PROPOFOL N/A 05/03/2018   Dr. Jena Gauss: diverticulosis in entire colon, multiple polyps in sigmoid, at splenic flexure, and ascending colon, not all polyps removed. Tubular adenomas and inflammatory polyps. Needs 3 year surveilance   COLONOSCOPY WITH PROPOFOL N/A 02/15/2021   Many polyps without adenomatous changes but all inflammatory in nature with mildly active colitis and chronic inflammatory mucosa.  Repeat in 5 years.   CORONARY PRESSURE/FFR STUDY N/A 08/01/2021   Procedure: INTRAVASCULAR PRESSURE WIRE/FFR STUDY;  Surgeon: Kathleene Hazel, MD;  [facility];  Service: Cardiovascular;  Laterality: N/A;   EPIDURAL BLOCK INJECTION  2020    ESOPHAGOGASTRODUODENOSCOPY (EGD) WITH PROPOFOL N/A 02/02/2017   Dr. Jena Gauss: esophageal stenosis s/p dilation and biopsy, medium sized hiatal hernia, normal duodenum   ESOPHAGOGASTRODUODENOSCOPY (EGD) WITH PROPOFOL N/A 07/30/2017   Dr. Jena Gauss: esophageal stenosis s/p dilation and biopsy, medium-sized hiatal hernia, normal duodenum   HEMORRHOID SURGERY  01-21-2008    [facility]   HIP ARTHROPLASTY Bilateral    IR RADIOLOGIST EVAL & MGMT  08/29/2020   LEFT HEART CATH AND CORONARY ANGIOGRAPHY N/A 08/01/2021   Procedure: LEFT HEART CATH AND CORONARY ANGIOGRAPHY;  Surgeon: Kathleene Hazel, MD;  [facility];  Service: Cardiovascular;  Laterality: N/A;   MALONEY DILATION N/A 02/02/2017   Procedure: Elease Hashimoto DILATION;  Surgeon: Corbin Ade, MD;  [facility];  Service: Endoscopy;  Laterality: N/A;  MALONEY DILATION N/A 07/30/2017   Procedure: Elease Hashimoto DILATION;  Surgeon: Corbin Ade, MD;  [facility];  Service: Endoscopy;  Laterality: N/A;   POLYPECTOMY  05/03/2018   Procedure: POLYPECTOMY;  Surgeon: Corbin Ade, MD;  [facility];  Service: Endoscopy;;  ascending colon polyp, sigmoid colon polyps (multiple)   POLYPECTOMY  02/15/2021   Procedure: POLYPECTOMY;  Surgeon: Corbin Ade, MD;  [facility];  Service: Endoscopy;;   RADIOACTIVE SEED IMPLANT N/A 04/23/2017   Procedure: RADIOACTIVE SEED IMPLANT/BRACHYTHERAPY IMPLANT;  Surgeon: Marcine Matar, MD;  Location: [facility];  Service: Urology;  Laterality: N/A;   SHOULDER ARTHROSCOPY  08/05/2003   SPACE OAR INSTILLATION N/A 04/23/2017   Procedure: SPACE OAR INSTILLATION;  Surgeon: Marcine Matar, MD;  Location: [facility];  Service: Urology;  Laterality: N/A;   TRANSTHORACIC ECHOCARDIOGRAM  03/28/2009   mild LVH, ef 55-60%/ mild aorta calcification   VASECTOMY  08/05/1983   VIDEO ASSISTED THORACOSCOPY (VATS)/DECORTICATION Left      Current Outpatient Medications  Medication Sig Dispense Refill   acetaminophen (TYLENOL) 500 MG tablet Take 2 tablets (1,000 mg total) by mouth every 6 (six) hours as needed for mild pain. 30 tablet 0   albuterol (PROVENTIL) (2.5 MG/3ML) 0.083% nebulizer solution Take 2.5 mg by nebulization every 6 (six) hours as needed for wheezing or shortness of breath.     albuterol (VENTOLIN HFA) 108 (90 Base) MCG/ACT inhaler Inhale 2 puffs into the lungs every 6 (six) hours as needed for wheezing or shortness of breath.     allopurinol (ZYLOPRIM) 300 MG tablet Take 300 mg by mouth daily.     doxycycline (VIBRAMYCIN) 50 MG capsule Take 50 mg by mouth daily.     ipratropium-albuterol (DUONEB) 0.5-2.5 (3) MG/3ML SOLN Take 3 mLs by nebulization 4 (four) times daily.     nitroGLYCERIN (NITROSTAT) 0.4 MG SL tablet Place 1 tablet (0.4 mg total) under the tongue every 5 (five) minutes as needed for chest pain. 100 tablet 3   NON FORMULARY Pt uses a cpap nightly     rosuvastatin (CRESTOR) 20 MG tablet TAKE 1 TABLET(20 MG) BY MOUTH DAILY (Patient taking differently: Take 20 mg by mouth daily.) 90 tablet 3   TRELEGY ELLIPTA 100-62.5-25 MCG/ACT AEPB INHALE 1 PUFF INTO THE LUNGS DAILY 60 each 5   warfarin (COUMADIN) 2 MG tablet Take 1 tablet (2 mg total) by mouth daily. (Patient taking differently: Take 1 mg by mouth daily. Alternate 2mg  every other day and 1 mg every other day) 90 tablet 3   No current facility-administered medications for this visit.    Allergies as of 12/17/2022   (No Known Allergies)    Family History  Problem Relation Age of Onset   Diabetes Mother    Heart disease Father    Heart disease Brother    Cancer Neg Hx    Colon cancer Neg Hx    Colon polyps Neg Hx    Neuropathy Neg Hx     Social History   Socioeconomic History   Marital status: Married    Spouse name: Not on file   Number of children: 2   Years of education: Not on file   Highest education level: Bachelor's  degree (e.g., BA, AB, BS)  Occupational History   Not on file  Tobacco Use   Smoking status: Former    Packs/day: 1.00    Years: 30.00    Additional pack years: 0.00  Total pack years: 30.00    Types: Cigarettes, E-cigarettes    Quit date: 10/03/2015    Years since quitting: 7.2    Passive exposure: Past   Smokeless tobacco: Never   Tobacco comments:    stoped vaping 2018  Vaping Use   Vaping Use: Former  Substance and Sexual Activity   Alcohol use: Not Currently    Alcohol/week: 5.0 standard drinks of alcohol    Types: 5 Glasses of wine per week    Comment: Quit 10/26/22- previously daily   Drug use: No   Sexual activity: Yes    Birth control/protection: None  Other Topics Concern   Not on file  Social History Narrative   Lives at home with his wife   Right handed   Social Determinants of Health   Financial Resource Strain: Not on file  Food Insecurity: No Food Insecurity (10/26/2022)   Hunger Vital Sign    Worried About Running Out of Food in the Last Year: Never true    Ran Out of Food in the Last Year: Never true  Transportation Needs: No Transportation Needs (10/26/2022)   PRAPARE - Administrator, Civil Service (Medical): No    Lack of Transportation (Non-Medical): No  Physical Activity: Not on file  Stress: Not on file  Social Connections: Not on file    Review of Systems: Gen: Denies fever, chills, cold or flulike symptoms, presyncope, syncope. CV: Denies chest pain, palpitations. Resp: Denies dyspnea, cough. GI: See HPI. Heme: See HPI  Physical Exam: BP 112/67 (BP Location: Right Arm, Patient Position: Sitting, Cuff Size: Normal)   Pulse 91   Temp 98.4 F (36.9 C) (Temporal)   Ht 6\' 1"  (1.854 m)   Wt 208 lb (94.3 kg)   SpO2 95%   BMI 27.44 kg/m  General:   Alert and oriented. No distress noted. Pleasant and cooperative.  Head:  Normocephalic and atraumatic. Eyes:  Conjuctiva clear without scleral icterus. Heart:  S1, S2 present  without murmurs appreciated. Lungs:  Clear to auscultation bilaterally. No wheezes, rales, or rhonchi. No distress.  Abdomen:  +BS, soft, non-tender and non-distended. No rebound or guarding. No HSM or masses noted. Msk:  Symmetrical without gross deformities. Normal posture. Extremities:  Without edema. Neurologic:  Alert and  oriented x4 Psych:  Normal mood and affect.    Assessment:   67 y.o. male presenting today with a history of  CAD, COPD, DVT/PE, Factor 5 Leiden mutation chronically anticoagulated with Coumadin, prostate cancer, EtOH pancreatitis, GERD, PUD, esophageal stricture s/p dilation, colon polyps, complicated diverticulitis with abscess s/p rectosigmoid resection July 2023, fatty liver/elevated LFTs, presenting today for follow-up   GERD: Doing well at this time without any symptoms.  Has been on pantoprazole 40 mg daily, but ran out 1 week ago and has been doing well off medication.  He prefers to stay off medication if he can.    Elevated LFTs/fatty liver: Likely secondary to chronic alcohol use.  LFTs recently normalized during hospitalization in March 2024 for acute pancreatitis.  States he has been abstinent since his admission.  Abdominal ultrasound with elastography in March with fatty liver, kPa 3.3.  Patient reports having upcoming blood work next month with PCP.  History of pancreatitis: History of alcoholic pancreatitis.  Recently admitted March 2024 with acute alcohol pancreatitis.  CTA chest/abdomen/pelvis  with  heterogeneous enhancement noted in the head of the pancreas which is enlarged with a focal area of hypodensity measuring 1.7 x 1.6 cm.  Parenchymal calcification noted in the head and tail of pancreas, associated with mild prominence of the main pancreatic duct. There is diffuse mild peripancreatic edema/inflammation.  Recommended follow-up MRI to rule out underlying pancreatic mass. Patient reports he has been doing well since hospital discharge.  Has made  dietary changes and stopped drinking all alcohol. He has lost 22 lbs with these changes. Denies symptoms of chronic pancreatitis.   Plan:  MRI abdomen with and without contrast. Okay to hold pantoprazole for now.  Monitor for recurrent reflux symptoms of chest pain. Continue to avoid alcohol. Requested patient have upcoming blood work with PCP faxed to our office. Follow-up in 6 months or sooner if needed.    Ermalinda Memos, PA-C [facility] 12/17/2022

## 2022-12-15 ENCOUNTER — Ambulatory Visit: Payer: PPO | Admitting: Gastroenterology

## 2022-12-15 NOTE — Progress Notes (Signed)
History of Present Illness:   He underwent TRUS/Bx on 5.1.2018. He had a 4 mm left apical nodule, a PSA of 5.99. Prostatic volume was 17 g. PSA density 0.35.  6/12 cores were positive for adenocarcinoma, all on the left side of the prostate.  3 cores revealed GG 1 pattern 3 cores revealed GG 2 pattern IPSS pre-procedure: 2/0   He underwent I-125 brachytherapy on 9.20.2018.   6.15.2021: PSA undetectable.  Mild Peyronie's, downward curvature.  Also with ED and urinary urgency.   6.28.2022: PSA undetectable. He has been treated for diverticulitis x 2 since January.  He has a good urinary stream.  He does have continued urgency.  No gross hematuria, no blood in his stool.  Currently, he is not eager to have a prescription for Viagra, but will talk with his wife about that.   5.9.2023:  Past Medical History:  Diagnosis Date   Bilateral shoulder bursitis    Chronic back pain    COPD (chronic obstructive pulmonary disease) with emphysema ([name])    DDD (degenerative disc disease), lumbar    hx epideral injection  L5--S1    Diverticulitis    DVT (deep venous thrombosis) ([name]) 11/19/2018   Dysphasia    intermittant w/ food    Dyspnea    Dyspnea on exertion    Fatty liver    GERD (gastroesophageal reflux disease)    Gout    L great toe   Heterozygous factor V Leiden mutation ([name])    Hiatal hernia    Hip problem 2020   left, seeing orthopedics   History of acute pancreatitis    alcoholic pancreatitis 11-11-2011 and 01-30-2012. 2021   History of colon polyps    10-17-2005  hyperplastic polyp's   History of esophageal stricture    s/p  dilation 02-02-2017   History of peptic ulcer 1990s   History of pneumococcal pneumonia 2006   bilateral pneumonia w/ left lower lobe abscess treated w/ chest tube with suction for drainage   History of pneumothorax    1984--  left spontaneous pneumothorax ,  treated w/ chest tube   Hypertension    Malignant neoplasm of prostate [facility]) urologist-  dr  Yadiel Aubry/  oncologist -- dr Kathrynn Running   dx 12-02-2016-- Stage T2b, Gleason 3+4,  PSA 5.99,  vol 25cc   Neuropathy    Numbness of left foot    outer left ankle numb-- per pt has appt. w/ neurologist   OSA (obstructive sleep apnea)    per pt has not used cpap over year ago from 04-16-2017--- per study 01-17-2010  severe osa   Pneumonia    Solid nodule of lung greater than 8 mm in diameter 05/11/2018   03/2018: RUL Nodule, 10mm   Wears glasses     Past Surgical History:  Procedure Laterality Date   BIOPSY  02/02/2017   Procedure: BIOPSY;  Surgeon: Corbin Ade, MD;  [facility];  Service: Endoscopy;;  duodenal bx's, esophgeal bx's   BIOPSY  07/30/2017   Procedure: BIOPSY;  Surgeon: Corbin Ade, MD;  [facility];  Service: Endoscopy;;  esophagus   BIOPSY  02/15/2021   Procedure: BIOPSY;  Surgeon: Corbin Ade, MD;  [facility];  Service: Endoscopy;;   CARDIOVASCULAR STRESS TEST  09/12/2009   normal nuclear perfusion study w/ no ischemia /  normal LV function and wall motion , ef 57%   COLONOSCOPY  2007   Dr. Jena Gauss: hyperplastic polyps, internal hemorrhoids  COLONOSCOPY WITH PROPOFOL N/A 05/03/2018   Dr. Jena Gauss: diverticulosis in entire colon, multiple polyps in sigmoid, at splenic flexure, and ascending colon, not all polyps removed. Tubular adenomas and inflammatory polyps. Needs 3 year surveilance   COLONOSCOPY WITH PROPOFOL N/A 02/15/2021   Many polyps without adenomatous changes but all inflammatory in nature with mildly active colitis and chronic inflammatory mucosa.  Repeat in 5 years.   CORONARY PRESSURE/FFR STUDY N/A 08/01/2021   Procedure: INTRAVASCULAR PRESSURE WIRE/FFR STUDY;  Surgeon: Kathleene Hazel, MD;  [facility];  Service: Cardiovascular;  Laterality: N/A;   EPIDURAL BLOCK INJECTION  2020   ESOPHAGOGASTRODUODENOSCOPY (EGD) WITH PROPOFOL N/A 02/02/2017   Dr. Jena Gauss: esophageal stenosis s/p dilation and  biopsy, medium sized hiatal hernia, normal duodenum   ESOPHAGOGASTRODUODENOSCOPY (EGD) WITH PROPOFOL N/A 07/30/2017   Dr. Jena Gauss: esophageal stenosis s/p dilation and biopsy, medium-sized hiatal hernia, normal duodenum   HEMORRHOID SURGERY  01-21-2008    [facility]   HIP ARTHROPLASTY Bilateral    IR RADIOLOGIST EVAL & MGMT  08/29/2020   LEFT HEART CATH AND CORONARY ANGIOGRAPHY N/A 08/01/2021   Procedure: LEFT HEART CATH AND CORONARY ANGIOGRAPHY;  Surgeon: Kathleene Hazel, MD;  [facility];  Service: Cardiovascular;  Laterality: N/A;   MALONEY DILATION N/A 02/02/2017   Procedure: Elease Hashimoto DILATION;  Surgeon: Corbin Ade, MD;  [facility];  Service: Endoscopy;  Laterality: N/A;   MALONEY DILATION N/A 07/30/2017   Procedure: Elease Hashimoto DILATION;  Surgeon: Corbin Ade, MD;  [facility];  Service: Endoscopy;  Laterality: N/A;   POLYPECTOMY  05/03/2018   Procedure: POLYPECTOMY;  Surgeon: Corbin Ade, MD;  [facility];  Service: Endoscopy;;  ascending colon polyp, sigmoid colon polyps (multiple)   POLYPECTOMY  02/15/2021   Procedure: POLYPECTOMY;  Surgeon: Corbin Ade, MD;  [facility];  Service: Endoscopy;;   RADIOACTIVE SEED IMPLANT N/A 04/23/2017   Procedure: RADIOACTIVE SEED IMPLANT/BRACHYTHERAPY IMPLANT;  Surgeon: Marcine Matar, MD;  Location: [facility];  Service: Urology;  Laterality: N/A;   SHOULDER ARTHROSCOPY  08/05/2003   SPACE OAR INSTILLATION N/A 04/23/2017   Procedure: SPACE OAR INSTILLATION;  Surgeon: Marcine Matar, MD;  Location[facility];  Service: Urology;  Laterality: N/A;   TRANSTHORACIC ECHOCARDIOGRAM  03/28/2009   mild LVH, ef 55-60%/ mild aorta calcification   VASECTOMY  08/05/1983   VIDEO ASSISTED THORACOSCOPY (VATS)/DECORTICATION Left     Home Medications:  Allergies as of 12/16/2022   No Known Allergies      Medication List         Accurate as of Dec 15, 2022  9:05 AM. If you have any questions, ask your nurse or doctor.          acetaminophen 500 MG tablet Commonly known as: TYLENOL Take 2 tablets (1,000 mg total) by mouth every 6 (six) hours as needed for mild pain.   albuterol 108 (90 Base) MCG/ACT inhaler Commonly known as: VENTOLIN HFA Inhale 2 puffs into the lungs every 6 (six) hours as needed for wheezing or shortness of breath.   albuterol (2.5 MG/3ML) 0.083% nebulizer solution Commonly known as: PROVENTIL Take 2.5 mg by nebulization every 6 (six) hours as needed for wheezing or shortness of breath.   allopurinol 300 MG tablet Commonly known as: ZYLOPRIM Take 300 mg by mouth daily.   amLODipine 5 MG tablet Commonly known as: NORVASC Take 1 tablet (5 mg total) by mouth  daily.   amoxicillin 500 MG capsule Commonly known as: AMOXIL Take 4 capsules by mouth once. Take 1 hour before dentist appt.   doxycycline 50 MG capsule Commonly known as: VIBRAMYCIN Take 50 mg by mouth daily.   ipratropium-albuterol 0.5-2.5 (3) MG/3ML Soln Commonly known as: DUONEB Take 3 mLs by nebulization 4 (four) times daily.   metoprolol tartrate 25 MG tablet Commonly known as: LOPRESSOR Take 1 tablet (25 mg total) by mouth 2 (two) times daily.   nitroGLYCERIN 0.4 MG SL tablet Commonly known as: Nitrostat Place 1 tablet (0.4 mg total) under the tongue every 5 (five) minutes as needed for chest pain.   NON FORMULARY Pt uses a cpap nightly   pantoprazole 40 MG tablet Commonly known as: PROTONIX Take 1 tablet (40 mg total) by mouth daily.   pregabalin 50 MG capsule Commonly known as: LYRICA Take 50 mg by mouth 3 (three) times daily.   rosuvastatin 20 MG tablet Commonly known as: CRESTOR TAKE 1 TABLET(20 MG) BY MOUTH DAILY What changed: See the new instructions.   Trelegy Ellipta 100-62.5-25 MCG/ACT Aepb Generic drug: Fluticasone-Umeclidin-Vilant INHALE 1 PUFF INTO THE LUNGS DAILY   warfarin 2 MG  tablet Commonly known as: COUMADIN Take as directed by the anticoagulation clinic. If you are unsure how to take this medication, talk to your nurse or doctor. Original instructions: Take 1 tablet (2 mg total) by mouth daily. What changed:  how much to take additional instructions        Allergies: No Known Allergies  Family History  Problem Relation Age of Onset   Diabetes Mother    Heart disease Father    Heart disease Brother    Cancer Neg Hx    Colon cancer Neg Hx    Colon polyps Neg Hx    Neuropathy Neg Hx     Social History:  reports that he quit smoking about 7 years ago. His smoking use included cigarettes and e-cigarettes. He has a 30.00 pack-year smoking history. He has been exposed to tobacco smoke. He has never used smokeless tobacco. He reports current alcohol use of about 5.0 standard drinks of alcohol per week. He reports that he does not use drugs.  ROS: A complete review of systems was performed.  All systems are negative except for pertinent findings as noted.  Physical Exam:  Vital signs in last 24 hours: There were no vitals taken for this visit. Constitutional:  Alert and oriented, No acute distress Cardiovascular: Regular rate  Respiratory: Normal respiratory effort GI: Abdomen is soft, nontender, nondistended, no abdominal masses. No CVAT.  Genitourinary: Normal male phallus, testes are descended bilaterally and non-tender and without masses, scrotum is normal in appearance without lesions or masses, perineum is normal on inspection. Lymphatic: No lymphadenopathy Neurologic: Grossly intact, no focal deficits Psychiatric: Normal mood and affect  I have reviewed prior pt notes  I have reviewed notes from referring/previous physicians  I have reviewed urinalysis results  I have independently reviewed prior imaging  I have reviewed prior PSA results  I have reviewed prior urine culture   Impression/Assessment:  ***  Plan:  ***

## 2022-12-16 ENCOUNTER — Ambulatory Visit: Payer: PPO | Attending: Internal Medicine | Admitting: *Deleted

## 2022-12-16 ENCOUNTER — Ambulatory Visit (INDEPENDENT_AMBULATORY_CARE_PROVIDER_SITE_OTHER): Payer: PPO | Admitting: Urology

## 2022-12-16 ENCOUNTER — Encounter: Payer: Self-pay | Admitting: Urology

## 2022-12-16 VITALS — BP 97/63 | HR 94

## 2022-12-16 DIAGNOSIS — Z5181 Encounter for therapeutic drug level monitoring: Secondary | ICD-10-CM

## 2022-12-16 DIAGNOSIS — Z8546 Personal history of malignant neoplasm of prostate: Secondary | ICD-10-CM

## 2022-12-16 DIAGNOSIS — Z7901 Long term (current) use of anticoagulants: Secondary | ICD-10-CM | POA: Diagnosis not present

## 2022-12-16 DIAGNOSIS — Z87438 Personal history of other diseases of male genital organs: Secondary | ICD-10-CM

## 2022-12-16 DIAGNOSIS — R3915 Urgency of urination: Secondary | ICD-10-CM

## 2022-12-16 DIAGNOSIS — I2609 Other pulmonary embolism with acute cor pulmonale: Secondary | ICD-10-CM

## 2022-12-16 DIAGNOSIS — N521 Erectile dysfunction due to diseases classified elsewhere: Secondary | ICD-10-CM

## 2022-12-16 DIAGNOSIS — I82433 Acute embolism and thrombosis of popliteal vein, bilateral: Secondary | ICD-10-CM | POA: Diagnosis not present

## 2022-12-16 DIAGNOSIS — N486 Induration penis plastica: Secondary | ICD-10-CM

## 2022-12-16 DIAGNOSIS — N401 Enlarged prostate with lower urinary tract symptoms: Secondary | ICD-10-CM

## 2022-12-16 LAB — URINALYSIS, ROUTINE W REFLEX MICROSCOPIC
Bilirubin, UA: NEGATIVE
Glucose, UA: NEGATIVE
Ketones, UA: NEGATIVE
Leukocytes,UA: NEGATIVE
Nitrite, UA: NEGATIVE
Protein,UA: NEGATIVE
RBC, UA: NEGATIVE
Specific Gravity, UA: 1.01 (ref 1.005–1.030)
Urobilinogen, Ur: 0.2 mg/dL (ref 0.2–1.0)
pH, UA: 6.5 (ref 5.0–7.5)

## 2022-12-16 LAB — BLADDER SCAN AMB NON-IMAGING: Scan Result: 31

## 2022-12-16 LAB — POCT INR: INR: 2.7 (ref 2.0–3.0)

## 2022-12-16 NOTE — Patient Instructions (Signed)
Continue warfarin 2 tablets daily except 1 tablet on Sunday, Wednesdays and Fridays Recheck in 1 week Started a plant based diet.  Eats 1 pound raw veggies every day.  Does not want to change diet.

## 2022-12-17 ENCOUNTER — Ambulatory Visit (INDEPENDENT_AMBULATORY_CARE_PROVIDER_SITE_OTHER): Payer: PPO | Admitting: Gastroenterology

## 2022-12-17 ENCOUNTER — Encounter: Payer: Self-pay | Admitting: Gastroenterology

## 2022-12-17 ENCOUNTER — Encounter: Payer: Self-pay | Admitting: *Deleted

## 2022-12-17 VITALS — BP 112/67 | HR 91 | Temp 98.4°F | Ht 73.0 in | Wt 208.0 lb

## 2022-12-17 DIAGNOSIS — Z8719 Personal history of other diseases of the digestive system: Secondary | ICD-10-CM | POA: Diagnosis not present

## 2022-12-17 DIAGNOSIS — R7989 Other specified abnormal findings of blood chemistry: Secondary | ICD-10-CM

## 2022-12-17 DIAGNOSIS — K219 Gastro-esophageal reflux disease without esophagitis: Secondary | ICD-10-CM | POA: Diagnosis not present

## 2022-12-17 DIAGNOSIS — K760 Fatty (change of) liver, not elsewhere classified: Secondary | ICD-10-CM

## 2022-12-17 LAB — PSA: Prostate Specific Ag, Serum: <0.1 ng/mL (ref 0.0–4.0)

## 2022-12-17 NOTE — Patient Instructions (Signed)
We will arrange for you to have an MRI of your abdomen to follow-up on pancreatitis.   You can continue to hold pantoprazole for now. Let me know if you have recurrent reflux symptoms or chest pain.   Continue to avoid all alcohol.   Have your upcoming blood work faxed to our office.   We will see you back in 6 months or sooner if needed.   It was good to see you again today!  Ermalinda Memos, PA-C [facility]

## 2022-12-23 ENCOUNTER — Ambulatory Visit: Payer: PPO | Attending: Internal Medicine | Admitting: *Deleted

## 2022-12-23 DIAGNOSIS — Z5181 Encounter for therapeutic drug level monitoring: Secondary | ICD-10-CM

## 2022-12-23 DIAGNOSIS — I82433 Acute embolism and thrombosis of popliteal vein, bilateral: Secondary | ICD-10-CM

## 2022-12-23 DIAGNOSIS — Z7901 Long term (current) use of anticoagulants: Secondary | ICD-10-CM

## 2022-12-23 DIAGNOSIS — I2609 Other pulmonary embolism with acute cor pulmonale: Secondary | ICD-10-CM

## 2022-12-23 LAB — POCT INR: INR: 3.1 — AB (ref 2.0–3.0)

## 2022-12-23 NOTE — Patient Instructions (Signed)
Decrease warfarin to 1 tablet daily except 2 tablets on Mondays, Wednesdays and Fridays Recheck in 2 weeks Started a plant based diet.  Eats 1 pound raw veggies every day.  Does not want to change diet.

## 2022-12-30 ENCOUNTER — Telehealth: Payer: Self-pay | Admitting: Pulmonary Disease

## 2022-12-30 NOTE — Telephone Encounter (Signed)
Patient lives in [place] and would like to know if he can start doing his annual visits in [place] instead of going to [place]. Patient currently sees Dr. Tonia Brooms and is following up with Rubye Oaks, NP this Friday. Please advise on provider switch.

## 2022-12-31 NOTE — Telephone Encounter (Signed)
Dr. Tonia Brooms please advise if your okay with patient seeing a provider in [place]?

## 2022-12-31 NOTE — Telephone Encounter (Signed)
Called and spoke with patient. He verbalized understanding. At time of call, he stated that he did not have a preference of the [facility] doctors but he will discuss his options with TP this Friday.   While on the phone, he wanted to discuss his Trelegy. He stated that he was told by [facility] that [facility] is the preferred pharmacy for HTA. He had his Trelegy RX sent over to [facility]. The copay increased from $47 to $160 at [facility]. He wanted to know if we had any copay cards. I advised him that since he has Medicare, he would not be able to use the copay card. I did offer patient assistance but he stated that he had too much income and assets to qualify.   He did want to see if we could check to see why the insurance charged so much for the refill at [facility]. He verified that he only received a 30 day supply.   Pharmacy team, can we run a test claim for his Trelegy ? Thanks!

## 2023-01-02 ENCOUNTER — Telehealth: Payer: Self-pay | Admitting: *Deleted

## 2023-01-02 ENCOUNTER — Ambulatory Visit (INDEPENDENT_AMBULATORY_CARE_PROVIDER_SITE_OTHER): Payer: PPO | Admitting: Adult Health

## 2023-01-02 ENCOUNTER — Other Ambulatory Visit (HOSPITAL_COMMUNITY): Payer: Self-pay

## 2023-01-02 ENCOUNTER — Encounter: Payer: Self-pay | Admitting: Adult Health

## 2023-01-02 VITALS — BP 114/68 | HR 61 | Temp 97.6°F | Ht 73.0 in | Wt 204.2 lb

## 2023-01-02 DIAGNOSIS — G4733 Obstructive sleep apnea (adult) (pediatric): Secondary | ICD-10-CM | POA: Diagnosis not present

## 2023-01-02 DIAGNOSIS — J449 Chronic obstructive pulmonary disease, unspecified: Secondary | ICD-10-CM

## 2023-01-02 DIAGNOSIS — I2609 Other pulmonary embolism with acute cor pulmonale: Secondary | ICD-10-CM | POA: Diagnosis not present

## 2023-01-02 NOTE — Assessment & Plan Note (Signed)
Compensated on present regimen  Plan  Patient Instructions  Continue on Trelegy 1 puff daily  rinse after use  Yearly CT chest as planned  Albuterol inhaler or neb As needed   Activity as tolerated   Continue on CPAP At bedtime   CPAP download  Order for new CPAP machine   Follow up with Dr. Tonia Brooms in 6 months and As needed   Please contact office for sooner follow up if symptoms do not improve or worsen or seek emergency care

## 2023-01-02 NOTE — Progress Notes (Signed)
@Patient  ID: [name], male    DOB: [birth year], 67 y.o.   MRN: [id number]  Chief Complaint  Patient presents with   Follow-up    Referring provider: Gareth Morgan, MD  HPI: 67 year old old male former smoker followed for COPD and PE/DVT , Factor V Leiden mutation on coumadin  Medical history significant for sleep apnea Participates in the lung cancer CT screening program  TEST/EVENTS :  CT chest August 18, 2022 lung RADS 2, emphysema, stable right upper lobe nodule measuring 7.6 mm.  PFT in July 04, 2021 shows moderate airflow obstruction with FEV1 at 62%, ratio 67, FVC 69%, postbronchodilator FEV1 66%, ratio 74, DLCO 67%.  No significant bronchodilator response  01/02/2023 Follow up ; COPD and OSA  Patient presents for a 1 year follow-up.  Patient has underlying moderate COPD.  He says overall breathing is doing okay.  He remains on Trelegy.  Denies any flare of cough or wheezing.  Rarely uses albuterol or nebulizer.  Says all of his vaccines are up-to-date with [facility].  Says he remains active swims daily.  Patient says he has underlying sleep apnea.  Has been on a CPAP machine for many years.  Last home sleep study June 29, 2017 showed severe sleep apnea with AHI at 79.  Says he needs a new CPAP machine.  Get CPAP supplies online.  Does not have an SD card.  Says he wears it every night all night cannot sleep without it.  Feels he benefits from CPAP.  History of PE and DVT with factor V Leiden mutation on chronic anticoagulation therapy with Coumadin.  Patient denies any hemoptysis.  Patient is managed by the Coumadin clinic.    No Known Allergies  Immunization History  Administered Date(s) Administered   Influenza Whole 05/30/2019   PFIZER(Purple Top)SARS-COV-2 Vaccination 10/27/2019, 11/19/2019, 06/01/2020   Tdap 08/22/2016    Past Medical History:  Diagnosis Date   Bilateral shoulder bursitis    Chronic back pain    COPD (chronic obstructive  pulmonary disease) with emphysema ([name])    DDD (degenerative disc disease), lumbar    hx epideral injection  L5--S1    Diverticulitis    DVT (deep venous thrombosis) ([name]) 11/19/2018   Dysphasia    intermittant w/ food    Dyspnea    Dyspnea on exertion    Fatty liver    GERD (gastroesophageal reflux disease)    Gout    L great toe   Heterozygous factor V Leiden mutation ([name])    Hiatal hernia    Hip problem 2020   left, seeing orthopedics   History of acute pancreatitis    alcoholic pancreatitis 11-11-2011 and 01-30-2012. 2021   History of colon polyps    10-17-2005  hyperplastic polyp's   History of esophageal stricture    s/p  dilation 02-02-2017   History of peptic ulcer 1990s   History of pneumococcal pneumonia 2006   bilateral pneumonia w/ left lower lobe abscess treated w/ chest tube with suction for drainage   History of pneumothorax    1984--  left spontaneous pneumothorax ,  treated w/ chest tube   Hypertension    Malignant neoplasm of prostate [facility]) urologist-  dr dahlstedt/  oncologist -- dr Kathrynn Running   dx 12-02-2016-- Stage T2b, Gleason 3+4,  PSA 5.99,  vol 25cc   Neuropathy    Numbness of left foot    outer left ankle numb-- per pt has appt. w/ neurologist   OSA (obstructive sleep apnea)  per pt has not used cpap over year ago from 04-16-2017--- per study 01-17-2010  severe osa   Pneumonia    Solid nodule of lung greater than 8 mm in diameter 05/11/2018   03/2018: RUL Nodule, 10mm   Wears glasses     Tobacco History: Social History   Tobacco Use  Smoking Status Former   Packs/day: 1.00   Years: 30.00   Additional pack years: 0.00   Total pack years: 30.00   Types: Cigarettes, E-cigarettes   Quit date: 10/03/2015   Years since quitting: 7.2   Passive exposure: Past  Smokeless Tobacco Never  Tobacco Comments   stoped vaping 2018   Counseling given: Not Answered Tobacco comments: stoped vaping 2018   Outpatient Medications Prior to Visit   Medication Sig Dispense Refill   acetaminophen (TYLENOL) 500 MG tablet Take 2 tablets (1,000 mg total) by mouth every 6 (six) hours as needed for mild pain. 30 tablet 0   albuterol (PROVENTIL) (2.5 MG/3ML) 0.083% nebulizer solution Take 2.5 mg by nebulization every 6 (six) hours as needed for wheezing or shortness of breath.     albuterol (VENTOLIN HFA) 108 (90 Base) MCG/ACT inhaler Inhale 2 puffs into the lungs every 6 (six) hours as needed for wheezing or shortness of breath.     allopurinol (ZYLOPRIM) 300 MG tablet Take 300 mg by mouth daily.     doxycycline (VIBRAMYCIN) 50 MG capsule Take 50 mg by mouth daily.     ipratropium-albuterol (DUONEB) 0.5-2.5 (3) MG/3ML SOLN Take 3 mLs by nebulization 4 (four) times daily.     nitroGLYCERIN (NITROSTAT) 0.4 MG SL tablet Place 1 tablet (0.4 mg total) under the tongue every 5 (five) minutes as needed for chest pain. 100 tablet 3   NON FORMULARY Pt uses a cpap nightly     rosuvastatin (CRESTOR) 20 MG tablet TAKE 1 TABLET(20 MG) BY MOUTH DAILY (Patient taking differently: Take 20 mg by mouth daily.) 90 tablet 3   TRELEGY ELLIPTA 100-62.5-25 MCG/ACT AEPB INHALE 1 PUFF INTO THE LUNGS DAILY 60 each 5   warfarin (COUMADIN) 2 MG tablet Take 1 tablet (2 mg total) by mouth daily. (Patient taking differently: Take 1 mg by mouth daily. Alternate 2mg  every other day and 1 mg every other day) 90 tablet 3   No facility-administered medications prior to visit.     Review of Systems:   Constitutional:   No  weight loss, night sweats,  Fevers, chills,  +fatigue, or  lassitude.  HEENT:   No headaches,  Difficulty swallowing,  Tooth/dental problems, or  Sore throat,                No sneezing, itching, ear ache, nasal congestion, post nasal drip,   CV:  No chest pain,  Orthopnea, PND, swelling in lower extremities, anasarca, dizziness, palpitations, syncope.   GI  No heartburn, indigestion, abdominal pain, nausea, vomiting, diarrhea, change in bowel habits, loss  of appetite, bloody stools.   Resp: .  No chest wall deformity  Skin: no rash or lesions.  GU: no dysuria, change in color of urine, no urgency or frequency.  No flank pain, no hematuria   MS:  No joint pain or swelling.  No decreased range of motion.  No back pain.    Physical Exam  BP 114/68 (BP Location: Left Arm, Patient Position: Sitting, Cuff Size: Normal)   Pulse 61   Temp 97.6 F (36.4 C) (Oral)   Ht 6\' 1"  (1.854 m)  Wt 204 lb 3.2 oz (92.6 kg)   SpO2 97%   BMI 26.94 kg/m   GEN: A/Ox3; pleasant , NAD, well nourished    HEENT:  [place]/AT,   NOSE-clear, THROAT-clear, no lesions, no postnasal drip or exudate noted.   NECK:  Supple w/ fair ROM; no JVD; normal carotid impulses w/o bruits; no thyromegaly or nodules palpated; no lymphadenopathy.    RESP  Clear  P & A; w/o, wheezes/ rales/ or rhonchi. no accessory muscle use, no dullness to percussion  CARD:  RRR, no m/r/g, no peripheral edema, pulses intact, no cyanosis or clubbing.  GI:   Soft & nt; nml bowel sounds; no organomegaly or masses detected.   Musco: Warm bil, no deformities or joint swelling noted.   Neuro: alert, no focal deficits noted.    Skin: Warm, no lesions or rashes    Lab Results:    BNP No results found for: "BNP"  ProBNP No results found for: "PROBNP"  Imaging: No results found.       Latest Ref Rng & Units 07/04/2021    3:47 PM 03/22/2018    3:32 PM  PFT Results  FVC-Pre L 3.57  3.30   FVC-Predicted Pre % 69  63   FVC-Post L 3.51  3.40   FVC-Predicted Post % 68  65   Pre FEV1/FVC % % 67  64   Post FEV1/FCV % % 74  69   FEV1-Pre L 2.41  2.12   FEV1-Predicted Pre % 62  54   FEV1-Post L 2.58  2.36   DLCO uncorrected ml/min/mmHg 19.86  18.65   DLCO UNC% % 67  52   DLCO corrected ml/min/mmHg 19.86    DLCO COR %Predicted % 67    DLVA Predicted % 87  69   TLC L 5.91  4.69   TLC % Predicted % 77  62   RV % Predicted % 89  38     No results found for:  "NITRICOXIDE"      Assessment & Plan:   Pulmonary embolism ([name]) Continue on Coumadin.  And follow-up with Coumadin clinic  Chronic obstructive lung disease ([name]) Compensated on present regimen  Plan  Patient Instructions  Continue on Trelegy 1 puff daily  rinse after use  Yearly CT chest as planned  Albuterol inhaler or neb As needed   Activity as tolerated   Continue on CPAP At bedtime   CPAP download  Order for new CPAP machine   Follow up with Dr. Tonia Brooms in 6 months and As needed   Please contact office for sooner follow up if symptoms do not improve or worsen or seek emergency care      Obstructive sleep apnea treated with continuous positive airway pressure (CPAP) Continue on CPAP.  Order for new CPAP.  Will need CPAP download  Plan  Patient Instructions  Continue on Trelegy 1 puff daily  rinse after use  Yearly CT chest as planned  Albuterol inhaler or neb As needed   Activity as tolerated   Continue on CPAP At bedtime   CPAP download  Order for new CPAP machine   Follow up with Dr. Tonia Brooms in 6 months and As needed   Please contact office for sooner follow up if symptoms do not improve or worsen or seek emergency care         Rubye Oaks, NP 01/02/2023

## 2023-01-02 NOTE — Telephone Encounter (Signed)
LVM for Brad ([employer]) to see if patient has CPAP through [facility] ([employer]).  Will await return call.

## 2023-01-02 NOTE — Assessment & Plan Note (Signed)
Continue on Coumadin.  And follow-up with Coumadin clinic

## 2023-01-02 NOTE — Assessment & Plan Note (Signed)
Continue on CPAP.  Order for new CPAP.  Will need CPAP download  Plan  Patient Instructions  Continue on Trelegy 1 puff daily  rinse after use  Yearly CT chest as planned  Albuterol inhaler or neb As needed   Activity as tolerated   Continue on CPAP At bedtime   CPAP download  Order for new CPAP machine   Follow up with Dr. Tonia Brooms in 6 months and As needed   Please contact office for sooner follow up if symptoms do not improve or worsen or seek emergency care

## 2023-01-02 NOTE — Patient Instructions (Signed)
Continue on Trelegy 1 puff daily  rinse after use  Yearly CT chest as planned  Albuterol inhaler or neb As needed   Activity as tolerated   Continue on CPAP At bedtime   CPAP download  Order for new CPAP machine   Follow up with Dr. Tonia Brooms in 6 months and As needed   Please contact office for sooner follow up if symptoms do not improve or worsen or seek emergency care

## 2023-01-02 NOTE — Telephone Encounter (Signed)
The patient would have to contact his insurance in order to find out why there is a different charge at a preferred pharmacy. The PA team doesn't have access to that type of information.

## 2023-01-06 ENCOUNTER — Ambulatory Visit: Payer: PPO | Attending: Internal Medicine | Admitting: *Deleted

## 2023-01-06 DIAGNOSIS — I2609 Other pulmonary embolism with acute cor pulmonale: Secondary | ICD-10-CM

## 2023-01-06 DIAGNOSIS — Z7901 Long term (current) use of anticoagulants: Secondary | ICD-10-CM

## 2023-01-06 DIAGNOSIS — Z5181 Encounter for therapeutic drug level monitoring: Secondary | ICD-10-CM

## 2023-01-06 DIAGNOSIS — I82433 Acute embolism and thrombosis of popliteal vein, bilateral: Secondary | ICD-10-CM

## 2023-01-06 LAB — POCT INR: INR: 3 (ref 2.0–3.0)

## 2023-01-06 NOTE — Patient Instructions (Signed)
Continue warfarin 1 tablet daily except 2 tablets on Mondays, Wednesdays and Fridays Recheck in 2 weeks Started a plant based diet.  Eats 1 pound raw veggies every day.  Does not want to change diet.

## 2023-01-07 NOTE — Telephone Encounter (Signed)
Closing encounter. NFN 

## 2023-01-09 LAB — LAB REPORT - SCANNED
A1c: 5.8
EGFR: 91
PSA, Total: 0.04

## 2023-01-13 ENCOUNTER — Inpatient Hospital Stay: Payer: PPO

## 2023-01-14 ENCOUNTER — Other Ambulatory Visit: Payer: Self-pay | Admitting: Gastroenterology

## 2023-01-14 ENCOUNTER — Ambulatory Visit (HOSPITAL_COMMUNITY)
Admission: RE | Admit: 2023-01-14 | Discharge: 2023-01-14 | Disposition: A | Discharge status: Home / Self Care | Payer: PPO | Source: Ambulatory Visit | Attending: Gastroenterology | Admitting: Gastroenterology

## 2023-01-14 DIAGNOSIS — Z8719 Personal history of other diseases of the digestive system: Secondary | ICD-10-CM

## 2023-01-14 MED ORDER — GADOBUTROL 1 MMOL/ML IV SOLN
10.0000 mL | Freq: Once | INTRAVENOUS | Status: AC | PRN
  Administered 2023-01-14: 10 mL via INTRAVENOUS

## 2023-01-15 ENCOUNTER — Ambulatory Visit (HOSPITAL_COMMUNITY): Admission: RE | Admit: 2023-01-15 | Payer: PPO | Source: Ambulatory Visit

## 2023-01-19 ENCOUNTER — Encounter: Payer: Self-pay | Admitting: *Deleted

## 2023-01-20 ENCOUNTER — Inpatient Hospital Stay: Payer: PPO | Admitting: Physician Assistant

## 2023-01-20 ENCOUNTER — Ambulatory Visit: Payer: PPO | Attending: Internal Medicine | Admitting: *Deleted

## 2023-01-20 DIAGNOSIS — I82433 Acute embolism and thrombosis of popliteal vein, bilateral: Secondary | ICD-10-CM

## 2023-01-20 DIAGNOSIS — Z7901 Long term (current) use of anticoagulants: Secondary | ICD-10-CM

## 2023-01-20 DIAGNOSIS — I2609 Other pulmonary embolism with acute cor pulmonale: Secondary | ICD-10-CM | POA: Diagnosis not present

## 2023-01-20 DIAGNOSIS — Z5181 Encounter for therapeutic drug level monitoring: Secondary | ICD-10-CM | POA: Diagnosis not present

## 2023-01-20 LAB — POCT INR: INR: 2.1 (ref 2.0–3.0)

## 2023-01-20 NOTE — Patient Instructions (Signed)
Continue warfarin 1 tablet daily except 2 tablets on Mondays, Wednesdays and Fridays Recheck in 1 week Started a plant based diet.  Eats 1 pound raw veggies every day.  Does not want to change diet.

## 2023-01-27 ENCOUNTER — Ambulatory Visit: Payer: PPO | Attending: Internal Medicine | Admitting: *Deleted

## 2023-01-27 DIAGNOSIS — I82433 Acute embolism and thrombosis of popliteal vein, bilateral: Secondary | ICD-10-CM

## 2023-01-27 DIAGNOSIS — Z5181 Encounter for therapeutic drug level monitoring: Secondary | ICD-10-CM | POA: Diagnosis not present

## 2023-01-27 DIAGNOSIS — I2609 Other pulmonary embolism with acute cor pulmonale: Secondary | ICD-10-CM

## 2023-01-27 DIAGNOSIS — Z7901 Long term (current) use of anticoagulants: Secondary | ICD-10-CM | POA: Diagnosis not present

## 2023-01-27 LAB — POCT INR: INR: 2.2 (ref 2.0–3.0)

## 2023-01-27 NOTE — Patient Instructions (Signed)
Continue warfarin 1 tablet daily except 2 tablets on Mondays, Wednesdays and Fridays Recheck in 3 weeks Started a plant based diet.  Eats 1 pound raw veggies every day.  Does not want to change diet.

## 2023-01-28 ENCOUNTER — Telehealth: Payer: Self-pay

## 2023-01-28 ENCOUNTER — Encounter: Payer: Self-pay | Admitting: Family Medicine

## 2023-01-28 ENCOUNTER — Ambulatory Visit (INDEPENDENT_AMBULATORY_CARE_PROVIDER_SITE_OTHER): Payer: PPO | Admitting: Family Medicine

## 2023-01-28 VITALS — BP 115/75 | HR 71 | Ht 73.0 in | Wt 194.0 lb

## 2023-01-28 DIAGNOSIS — L089 Local infection of the skin and subcutaneous tissue, unspecified: Secondary | ICD-10-CM

## 2023-01-28 DIAGNOSIS — R7989 Other specified abnormal findings of blood chemistry: Secondary | ICD-10-CM

## 2023-01-28 DIAGNOSIS — I10 Essential (primary) hypertension: Secondary | ICD-10-CM

## 2023-01-28 NOTE — Telephone Encounter (Signed)
In case he returns call please refer to this message.

## 2023-01-28 NOTE — Assessment & Plan Note (Signed)
Discussed lifestyle modifications follow diet low in saturated fat, reduce dietary salt intake, avoid fatty foods, maintain an exercise routine 3 to 5 days a week for a minimum total of 150 minutes.

## 2023-01-28 NOTE — Assessment & Plan Note (Signed)
Vitals:   01/28/23 0955  BP: 115/75   Blood pressure controlled in today's visit Currently not on B/P pressure medication Continued discussion on DASH diet, low sodium diet and maintain a exercise routine for 150 minutes per week.

## 2023-01-28 NOTE — Progress Notes (Signed)
New Patient Office Visit   Subjective   Patient ID: [name], male    DOB: November 23, 1955  Age: 67 y.o. MRN: [id number]  CC:  Chief Complaint  Patient presents with   New Patient (Initial Visit)    Establishing care, transferring from Dr. Sudie Bailey office, recently had labs through [facility] on 01/08/2023.   Skin Problem    Pt reports skin blisters on both feet.     HPI [name] 67 year old male, presents to establish care. He  has a past medical history of Bilateral shoulder bursitis, Chronic back pain, COPD (chronic obstructive pulmonary disease) with emphysema ([name]), DDD (degenerative disc disease), lumbar, Diverticulitis, DVT (deep venous thrombosis) ([name]) (11/19/2018), Dysphasia, Dyspnea, Dyspnea on exertion, Fatty liver, GERD (gastroesophageal reflux disease), Gout, Heterozygous factor V Leiden mutation ([name]), Hiatal hernia, Hip problem (2020), History of acute pancreatitis, History of colon polyps, History of esophageal stricture, History of peptic ulcer (1990s), History of pneumococcal pneumonia (2006), History of pneumothorax, Hypertension, Malignant neoplasm of prostate [facility]) (urologist-  dr dahlstedt/  oncologist -- dr Kathrynn Running), Neuropathy, Numbness of left foot, OSA (obstructive sleep apnea), Pneumonia, Solid nodule of lung greater than 8 mm in diameter (05/11/2018), and Wears glasses.For the details of today's visit, please refer to assessment and plan.   HPI    Outpatient Encounter Medications as of 01/28/2023  Medication Sig   acetaminophen (TYLENOL) 500 MG tablet Take 2 tablets (1,000 mg total) by mouth every 6 (six) hours as needed for mild pain.   albuterol (PROVENTIL) (2.5 MG/3ML) 0.083% nebulizer solution Take 2.5 mg by nebulization every 6 (six) hours as needed for wheezing or shortness of breath.   albuterol (VENTOLIN HFA) 108 (90 Base) MCG/ACT inhaler Inhale 2 puffs into the lungs every 6 (six) hours as needed for wheezing or shortness of breath.    allopurinol (ZYLOPRIM) 300 MG tablet Take 300 mg by mouth daily.   doxycycline (VIBRAMYCIN) 50 MG capsule Take 50 mg by mouth daily.   ipratropium-albuterol (DUONEB) 0.5-2.5 (3) MG/3ML SOLN Take 3 mLs by nebulization 4 (four) times daily.   nitroGLYCERIN (NITROSTAT) 0.4 MG SL tablet Place 1 tablet (0.4 mg total) under the tongue every 5 (five) minutes as needed for chest pain.   NON FORMULARY Pt uses a cpap nightly   rosuvastatin (CRESTOR) 20 MG tablet TAKE 1 TABLET(20 MG) BY MOUTH DAILY (Patient taking differently: Take 20 mg by mouth daily.)   TRELEGY ELLIPTA 100-62.5-25 MCG/ACT AEPB INHALE 1 PUFF INTO THE LUNGS DAILY   warfarin (COUMADIN) 2 MG tablet Take 1 tablet (2 mg total) by mouth daily. (Patient taking differently: Take 1 mg by mouth daily. Alternate 2mg  every other day and 1 mg every other day)   No facility-administered encounter medications on file as of 01/28/2023.    Past Surgical History:  Procedure Laterality Date   BIOPSY  02/02/2017   Procedure: BIOPSY;  Surgeon: Corbin Ade, MD;  [facility];  Service: Endoscopy;;  duodenal bx's, esophgeal bx's   BIOPSY  07/30/2017   Procedure: BIOPSY;  Surgeon: Corbin Ade, MD;  [facility];  Service: Endoscopy;;  esophagus   BIOPSY  02/15/2021   Procedure: BIOPSY;  Surgeon: Corbin Ade, MD;  [facility];  Service: Endoscopy;;   CARDIOVASCULAR STRESS TEST  09/12/2009   normal nuclear perfusion study w/ no ischemia /  normal LV function and wall motion , ef 57%   COLONOSCOPY  2007   Dr. Jena Gauss: hyperplastic  polyps, internal hemorrhoids    COLONOSCOPY WITH PROPOFOL N/A 05/03/2018   Dr. Jena Gauss: diverticulosis in entire colon, multiple polyps in sigmoid, at splenic flexure, and ascending colon, not all polyps removed. Tubular adenomas and inflammatory polyps. Needs 3 year surveilance   COLONOSCOPY WITH PROPOFOL N/A 02/15/2021   Many polyps without adenomatous changes but all inflammatory in  nature with mildly active colitis and chronic inflammatory mucosa.  Repeat in 5 years.   CORONARY PRESSURE/FFR STUDY N/A 08/01/2021   Procedure: INTRAVASCULAR PRESSURE WIRE/FFR STUDY;  Surgeon: Kathleene Hazel, MD;  [facility]: [facility];  Service: Cardiovascular;  Laterality: N/A;   EPIDURAL BLOCK INJECTION  2020   ESOPHAGOGASTRODUODENOSCOPY (EGD) WITH PROPOFOL N/A 02/02/2017   Dr. Jena Gauss: esophageal stenosis s/p dilation and biopsy, medium sized hiatal hernia, normal duodenum   ESOPHAGOGASTRODUODENOSCOPY (EGD) WITH PROPOFOL N/A 07/30/2017   Dr. Jena Gauss: esophageal stenosis s/p dilation and biopsy, medium-sized hiatal hernia, normal duodenum   HEMORRHOID SURGERY  01-21-2008    [facility]   HIP ARTHROPLASTY Bilateral    IR RADIOLOGIST EVAL & MGMT  08/29/2020   LEFT HEART CATH AND CORONARY ANGIOGRAPHY N/A 08/01/2021   Procedure: LEFT HEART CATH AND CORONARY ANGIOGRAPHY;  Surgeon: Kathleene Hazel, MD;  Location: [facility];  Service: Cardiovascular;  Laterality: N/A;   MALONEY DILATION N/A 02/02/2017   Procedure: Elease Hashimoto DILATION;  Surgeon: Corbin Ade, MD;  [facility];  Service: Endoscopy;  Laterality: N/A;   MALONEY DILATION N/A 07/30/2017   Procedure: Elease Hashimoto DILATION;  Surgeon: Corbin Ade, MD;  [facility];  Service: Endoscopy;  Laterality: N/A;   POLYPECTOMY  05/03/2018   Procedure: POLYPECTOMY;  Surgeon: Corbin Ade, MD;  [facility];  Service: Endoscopy;;  ascending colon polyp, sigmoid colon polyps (multiple)   POLYPECTOMY  02/15/2021   Procedure: POLYPECTOMY;  Surgeon: Corbin Ade, MD;  [facility];  Service: Endoscopy;;   RADIOACTIVE SEED IMPLANT N/A 04/23/2017   Procedure: RADIOACTIVE SEED IMPLANT/BRACHYTHERAPY IMPLANT;  Surgeon: Marcine Matar, MD;  Location: [facility];  Service: Urology;  Laterality: N/A;   SHOULDER ARTHROSCOPY  08/05/2003   SPACE OAR INSTILLATION N/A  04/23/2017   Procedure: SPACE OAR INSTILLATION;  Surgeon: Marcine Matar, MD;  Location[facility];  Service: Urology;  Laterality: N/A;   TRANSTHORACIC ECHOCARDIOGRAM  03/28/2009   mild LVH, ef 55-60%/ mild aorta calcification   VASECTOMY  08/05/1983   VIDEO ASSISTED THORACOSCOPY (VATS)/DECORTICATION Left     Review of Systems  Constitutional:  Negative for chills and fever.  Respiratory:  Negative for shortness of breath.   Cardiovascular:  Negative for chest pain.  Gastrointestinal:  Negative for abdominal pain.  Skin:  Positive for rash.  Neurological:  Negative for dizziness and headaches.      Objective    BP 115/75   Pulse 71   Ht 6\' 1"  (1.854 m)   Wt 194 lb (88 kg)   SpO2 93%   BMI 25.60 kg/m   Physical Exam Vitals reviewed.  Constitutional:      General: He is not in acute distress.    Appearance: Normal appearance. He is not ill-appearing, toxic-appearing or diaphoretic.  HENT:     Head: Normocephalic.  Eyes:     General:        Right eye: No discharge.        Left eye: No discharge.     Conjunctiva/sclera: Conjunctivae normal.  Cardiovascular:  Rate and Rhythm: Normal rate.     Pulses: Normal pulses.     Heart sounds: Normal heart sounds.  Pulmonary:     Effort: Pulmonary effort is normal. No respiratory distress.     Breath sounds: Normal breath sounds.  Abdominal:     General: Bowel sounds are normal.     Palpations: Abdomen is soft.     Tenderness: There is no abdominal tenderness. There is no right CVA tenderness, left CVA tenderness or guarding.  Musculoskeletal:        General: Normal range of motion.     Cervical back: Normal range of motion.  Skin:    General: Skin is warm and dry.     Capillary Refill: Capillary refill takes less than 2 seconds.  Neurological:     General: No focal deficit present.     Mental Status: He is alert and oriented to person, place, and time.     Coordination: Coordination normal.      Gait: Gait normal.  Psychiatric:        Mood and Affect: Mood normal.        Behavior: Behavior normal.       Assessment & Plan:  Skin infection -     Ambulatory referral to Dermatology  Primary hypertension Assessment & Plan: Vitals:   01/28/23 0955  BP: 115/75   Blood pressure controlled in today's visit Currently not on B/P pressure medication Continued discussion on DASH diet, low sodium diet and maintain a exercise routine for 150 minutes per week.    Elevated LFTs Assessment & Plan: Discussed lifestyle modifications follow diet low in saturated fat, reduce dietary salt intake, avoid fatty foods, maintain an exercise routine 3 to 5 days a week for a minimum total of 150 minutes.      Return in about 4 months (around 05/30/2023) for repeat routine labs.   Cruzita Lederer Newman Nip, FNP

## 2023-01-28 NOTE — Patient Instructions (Signed)
         Great to see you today!  Dermatology  # [phone]   [id number]    If labs were collected, we will inform you of lab results once received either by echart message or telephone call.   - echart message- for normal results that have been seen by the patient already.   - telephone call: abnormal results or if patient has not viewed results in their echart.   - Please take medications as prescribed. - Follow up with your primary health provider if any health concerns arises. - If symptoms worsen please contact your primary care provider and/or visit the emergency department.

## 2023-01-29 ENCOUNTER — Other Ambulatory Visit: Payer: Self-pay | Admitting: Pulmonary Disease

## 2023-01-29 DIAGNOSIS — J449 Chronic obstructive pulmonary disease, unspecified: Secondary | ICD-10-CM

## 2023-02-04 ENCOUNTER — Ambulatory Visit: Payer: PPO | Admitting: Family Medicine

## 2023-02-17 ENCOUNTER — Ambulatory Visit: Payer: PPO | Attending: Internal Medicine | Admitting: *Deleted

## 2023-02-17 DIAGNOSIS — Z5181 Encounter for therapeutic drug level monitoring: Secondary | ICD-10-CM | POA: Diagnosis not present

## 2023-02-17 DIAGNOSIS — I2609 Other pulmonary embolism with acute cor pulmonale: Secondary | ICD-10-CM

## 2023-02-17 DIAGNOSIS — I82433 Acute embolism and thrombosis of popliteal vein, bilateral: Secondary | ICD-10-CM | POA: Diagnosis not present

## 2023-02-17 DIAGNOSIS — Z7901 Long term (current) use of anticoagulants: Secondary | ICD-10-CM

## 2023-02-17 LAB — POCT INR: INR: 1.5 — AB (ref 2.0–3.0)

## 2023-02-17 NOTE — Patient Instructions (Signed)
Take warfarin 2 tablets today then increase dose to 2 tablets daily except 1 tablet on Sundays, Tuesdays and Thursdays Recheck in 2 weeks Started a plant based diet.  Eats 1 pound raw veggies every day.  Does not want to change diet.

## 2023-02-26 ENCOUNTER — Other Ambulatory Visit: Payer: Self-pay | Admitting: Pulmonary Disease

## 2023-02-26 DIAGNOSIS — J449 Chronic obstructive pulmonary disease, unspecified: Secondary | ICD-10-CM

## 2023-03-04 ENCOUNTER — Ambulatory Visit: Payer: PPO | Admitting: Family Medicine

## 2023-03-04 ENCOUNTER — Encounter: Payer: Self-pay | Admitting: Family Medicine

## 2023-03-04 ENCOUNTER — Ambulatory Visit (HOSPITAL_COMMUNITY)
Admission: RE | Admit: 2023-03-04 | Discharge: 2023-03-04 | Disposition: A | Discharge status: Home / Self Care | Payer: PPO | Source: Ambulatory Visit | Attending: Family Medicine | Admitting: Family Medicine

## 2023-03-04 VITALS — BP 102/64 | HR 74 | Ht 73.0 in | Wt 191.0 lb

## 2023-03-04 DIAGNOSIS — M545 Low back pain, unspecified: Secondary | ICD-10-CM | POA: Diagnosis present

## 2023-03-04 DIAGNOSIS — G8929 Other chronic pain: Secondary | ICD-10-CM | POA: Diagnosis not present

## 2023-03-04 MED ORDER — TIZANIDINE HCL 4 MG PO CAPS
4.0000 mg | ORAL_CAPSULE | Freq: Two times a day (BID) | ORAL | 1 refills | Status: DC | PRN
Start: 2023-03-04 — End: 2023-06-08

## 2023-03-04 NOTE — Assessment & Plan Note (Signed)
Now acute. Xray Ordered awaiting results will follow up Zanaflex 4 mg PRN Discussed medication desired effects, potential side effects. Non pharmacological interventions include rest, avoid twisting, improper bending, straining lower back. Demonstration of proper body mechanics. Alternate ice and heat. Recommend stretching back and legs. Follow up for worsening or persistent symptoms. Patient verbalizes understanding regarding plan of care and all questions answered.

## 2023-03-04 NOTE — Patient Instructions (Signed)
        Great to see you today.  I have refilled the medication(s) we provide.    - Please take medications as prescribed. - Follow up with your primary health provider if any health concerns arises. - If symptoms worsen please contact your primary care provider and/or visit the emergency department.  

## 2023-03-04 NOTE — Progress Notes (Signed)
Patient Office Visit   Subjective   Patient ID: [name], male    DOB: [birth year]  Age: 67 y.o. MRN: [id number]  CC:  Chief Complaint  Patient presents with   Back Pain    Patient complains of mid lower back pain for over a month.     HPI [name] 67 year old male, presents to the clinic for lower back pain for over a month now worsening. He  has a past medical history of Bilateral shoulder bursitis, Chronic back pain, COPD (chronic obstructive pulmonary disease) with emphysema ([name]), DDD (degenerative disc disease), lumbar, Diverticulitis, DVT (deep venous thrombosis) ([name]) (11/19/2018), Dysphasia, Dyspnea, Dyspnea on exertion, Fatty liver, GERD (gastroesophageal reflux disease), Gout, Heterozygous factor V Leiden mutation ([name]), Hiatal hernia, Hip problem (2020), History of acute pancreatitis, History of colon polyps, History of esophageal stricture, History of peptic ulcer (1990s), History of pneumococcal pneumonia (2006), History of pneumothorax, Hypertension, Malignant neoplasm of prostate [facility]) (urologist-  dr dahlstedt/  oncologist -- dr Kathrynn Running), Neuropathy, Numbness of left foot, OSA (obstructive sleep apnea), Pneumonia, Solid nodule of lung greater than 8 mm in diameter (05/11/2018), and Wears glasses.  Back Pain This is a new problem. Back pain occurs constantly. The pain is present mostly on right side lumbar spine and thoracic spine. The quality of the pain is described as aching dull, sharp. The pain does not radiate. The pain is at a severity of 6/10. The pain is The same all the time. The symptoms are aggravated by position, twisting, standing, sitting and bending. Stiffness is present all day. Pertinent negatives include no bladder incontinence, bowel incontinence, chest pain, fever, leg pain, numbness or tingling. He has tried bed rest, heat, ice and analgesics for the symptoms. The treatment provided no relief.        Outpatient Encounter Medications as  of 03/04/2023  Medication Sig   acetaminophen (TYLENOL) 500 MG tablet Take 2 tablets (1,000 mg total) by mouth every 6 (six) hours as needed for mild pain.   albuterol (PROVENTIL) (2.5 MG/3ML) 0.083% nebulizer solution Take 2.5 mg by nebulization every 6 (six) hours as needed for wheezing or shortness of breath.   albuterol (VENTOLIN HFA) 108 (90 Base) MCG/ACT inhaler Inhale 2 puffs into the lungs every 6 (six) hours as needed for wheezing or shortness of breath.   doxycycline (VIBRAMYCIN) 50 MG capsule Take 50 mg by mouth daily.   Fluticasone-Umeclidin-Vilant (TRELEGY ELLIPTA) 100-62.5-25 MCG/ACT AEPB INHALE 1 PUFF INTO LUNGS ONCE DAILY   ipratropium-albuterol (DUONEB) 0.5-2.5 (3) MG/3ML SOLN Take 3 mLs by nebulization 4 (four) times daily.   nitroGLYCERIN (NITROSTAT) 0.4 MG SL tablet Place 1 tablet (0.4 mg total) under the tongue every 5 (five) minutes as needed for chest pain.   NON FORMULARY Pt uses a cpap nightly   tiZANidine (ZANAFLEX) 4 MG capsule Take 1 capsule (4 mg total) by mouth 2 (two) times daily as needed for muscle spasms.   warfarin (COUMADIN) 2 MG tablet Take 1 tablet (2 mg total) by mouth daily. (Patient taking differently: Take 1 mg by mouth daily. Alternate 2mg  every other day and 1 mg every other day)   allopurinol (ZYLOPRIM) 300 MG tablet Take 300 mg by mouth daily. (Patient not taking: Reported on 03/04/2023)   rosuvastatin (CRESTOR) 20 MG tablet TAKE 1 TABLET(20 MG) BY MOUTH DAILY (Patient not taking: Reported on 03/04/2023)   No facility-administered encounter medications on file as of 03/04/2023.    Past Surgical History:  Procedure  Laterality Date   BIOPSY  02/02/2017   Procedure: BIOPSY;  Surgeon: Corbin Ade, MD;  Location: [facility];  Service: Endoscopy;;  duodenal bx's, esophgeal bx's   BIOPSY  07/30/2017   Procedure: BIOPSY;  Surgeon: Corbin Ade, MD;  Location: [facility];  Service: Endoscopy;;  esophagus   BIOPSY  02/15/2021   Procedure: BIOPSY;   Surgeon: Corbin Ade, MD;  [facility]: [facility];  Service: Endoscopy;;   CARDIOVASCULAR STRESS TEST  09/12/2009   normal nuclear perfusion study w/ no ischemia /  normal LV function and wall motion , ef 57%   COLONOSCOPY  2007   Dr. Jena Gauss: hyperplastic polyps, internal hemorrhoids    COLONOSCOPY WITH PROPOFOL N/A 05/03/2018   Dr. Jena Gauss: diverticulosis in entire colon, multiple polyps in sigmoid, at splenic flexure, and ascending colon, not all polyps removed. Tubular adenomas and inflammatory polyps. Needs 3 year surveilance   COLONOSCOPY WITH PROPOFOL N/A 02/15/2021   Many polyps without adenomatous changes but all inflammatory in nature with mildly active colitis and chronic inflammatory mucosa.  Repeat in 5 years.   CORONARY PRESSURE/FFR STUDY N/A 08/01/2021   Procedure: INTRAVASCULAR PRESSURE WIRE/FFR STUDY;  Surgeon: Kathleene Hazel, MD;  Location: [facility];  Service: Cardiovascular;  Laterality: N/A;   EPIDURAL BLOCK INJECTION  2020   ESOPHAGOGASTRODUODENOSCOPY (EGD) WITH PROPOFOL N/A 02/02/2017   Dr. Jena Gauss: esophageal stenosis s/p dilation and biopsy, medium sized hiatal hernia, normal duodenum   ESOPHAGOGASTRODUODENOSCOPY (EGD) WITH PROPOFOL N/A 07/30/2017   Dr. Jena Gauss: esophageal stenosis s/p dilation and biopsy, medium-sized hiatal hernia, normal duodenum   HEMORRHOID SURGERY  01-21-2008    [facility]   HIP ARTHROPLASTY Bilateral    IR RADIOLOGIST EVAL & MGMT  08/29/2020   LEFT HEART CATH AND CORONARY ANGIOGRAPHY N/A 08/01/2021   Procedure: LEFT HEART CATH AND CORONARY ANGIOGRAPHY;  Surgeon: Kathleene Hazel, MD;  Location: [facility];  Service: Cardiovascular;  Laterality: N/A;   MALONEY DILATION N/A 02/02/2017   Procedure: Elease Hashimoto DILATION;  Surgeon: Corbin Ade, MD;  [facility];  Service: Endoscopy;  Laterality: N/A;   MALONEY DILATION N/A 07/30/2017   Procedure: Elease Hashimoto DILATION;  Surgeon: Corbin Ade, MD;  [facility]: [facility];  Service: Endoscopy;  Laterality: N/A;   POLYPECTOMY  05/03/2018   Procedure: POLYPECTOMY;  Surgeon: Corbin Ade, MD;  [facility];  Service: Endoscopy;;  ascending colon polyp, sigmoid colon polyps (multiple)   POLYPECTOMY  02/15/2021   Procedure: POLYPECTOMY;  Surgeon: Corbin Ade, MD;  [facility]: [facility];  Service: Endoscopy;;   RADIOACTIVE SEED IMPLANT N/A 04/23/2017   Procedure: RADIOACTIVE SEED IMPLANT/BRACHYTHERAPY IMPLANT;  Surgeon: Marcine Matar, MD;  Location[facility];  Service: Urology;  Laterality: N/A;   SHOULDER ARTHROSCOPY  08/05/2003   SPACE OAR INSTILLATION N/A 04/23/2017   Procedure: SPACE OAR INSTILLATION;  Surgeon: Marcine Matar, MD;  Location: [facility];  Service: Urology;  Laterality: N/A;   TRANSTHORACIC ECHOCARDIOGRAM  03/28/2009   mild LVH, ef 55-60%/ mild aorta calcification   VASECTOMY  08/05/1983   VIDEO ASSISTED THORACOSCOPY (VATS)/DECORTICATION Left     Review of Systems  Constitutional:  Negative for chills and fever.  Eyes:  Negative for blurred vision.  Respiratory:  Negative for shortness of breath.   Cardiovascular:  Negative for chest pain.  Gastrointestinal:  Negative for abdominal pain.  Genitourinary:  Negative for dysuria.  Musculoskeletal:  Positive for  back pain and myalgias.  Neurological:  Negative for dizziness and headaches.      Objective    BP 102/64   Pulse 74   Ht 6\' 1"  (1.854 m)   Wt 191 lb (86.6 kg)   SpO2 94%   BMI 25.20 kg/m   Physical Exam Vitals reviewed.  Constitutional:      General: He is not in acute distress.    Appearance: Normal appearance. He is not ill-appearing, toxic-appearing or diaphoretic.  HENT:     Head: Normocephalic.  Eyes:     General:        Right eye: No discharge.        Left eye: No discharge.     Conjunctiva/sclera: Conjunctivae normal.  Cardiovascular:     Pulses: Normal pulses.     Heart sounds: Normal  heart sounds.  Pulmonary:     Effort: Pulmonary effort is normal. No respiratory distress.     Breath sounds: Normal breath sounds.  Abdominal:     General: Bowel sounds are normal.     Palpations: Abdomen is soft.     Tenderness: There is no abdominal tenderness. There is no right CVA tenderness, left CVA tenderness or guarding.  Musculoskeletal:        General: Normal range of motion.     Cervical back: Normal range of motion.  Skin:    General: Skin is warm and dry.     Capillary Refill: Capillary refill takes less than 2 seconds.  Neurological:     General: No focal deficit present.     Mental Status: He is alert and oriented to person, place, and time.     Coordination: Coordination normal.     Gait: Gait normal.  Psychiatric:        Mood and Affect: Mood normal.        Behavior: Behavior normal.       Assessment & Plan:  Lumbar pain -     tiZANidine HCl; Take 1 capsule (4 mg total) by mouth 2 (two) times daily as needed for muscle spasms.  Dispense: 30 capsule; Refill: 1 -     DG Lumbar Spine 2-3 Views; Future  Chronic bilateral low back pain, unspecified whether sciatica present Assessment & Plan: Now acute. Xray Ordered awaiting results will follow up Zanaflex 4 mg PRN Discussed medication desired effects, potential side effects. Non pharmacological interventions include rest, avoid twisting, improper bending, straining lower back. Demonstration of proper body mechanics. Alternate ice and heat. Recommend stretching back and legs. Follow up for worsening or persistent symptoms. Patient verbalizes understanding regarding plan of care and all questions answered.      Return if symptoms worsen or fail to improve.   Cruzita Lederer Newman Nip, FNP

## 2023-03-05 ENCOUNTER — Ambulatory Visit: Payer: PPO | Attending: Internal Medicine | Admitting: *Deleted

## 2023-03-05 DIAGNOSIS — I82433 Acute embolism and thrombosis of popliteal vein, bilateral: Secondary | ICD-10-CM

## 2023-03-05 DIAGNOSIS — Z7901 Long term (current) use of anticoagulants: Secondary | ICD-10-CM

## 2023-03-05 DIAGNOSIS — Z5181 Encounter for therapeutic drug level monitoring: Secondary | ICD-10-CM | POA: Diagnosis not present

## 2023-03-05 DIAGNOSIS — I2609 Other pulmonary embolism with acute cor pulmonale: Secondary | ICD-10-CM | POA: Diagnosis not present

## 2023-03-05 LAB — POCT INR: INR: 1.9 — AB (ref 2.0–3.0)

## 2023-03-05 NOTE — Patient Instructions (Signed)
Increase warfarin to 2 tablets daily except 1 tablet on Tuesdays and Fridays Recheck in 3 weeks Started a plant based diet.  Eats 1 pound raw veggies every day.  Does not want to change diet.

## 2023-03-10 ENCOUNTER — Other Ambulatory Visit: Payer: Self-pay | Admitting: Family Medicine

## 2023-03-10 DIAGNOSIS — M545 Low back pain, unspecified: Secondary | ICD-10-CM

## 2023-03-23 IMAGING — US US PELVIS LIMITED
1 series · 7 of 7 positions shown · non-contrast
Comparison: None Available.

CLINICAL DATA: Left groin swelling/pain

EXAM:
LIMITED ULTRASOUND OF PELVIS
TECHNIQUE: Limited transabdominal ultrasound examination of the pelvis was
performed.

[Series 1: us pelvis limited (transabdominal only) · 7 of 7 slices shown]
[im 1/7]
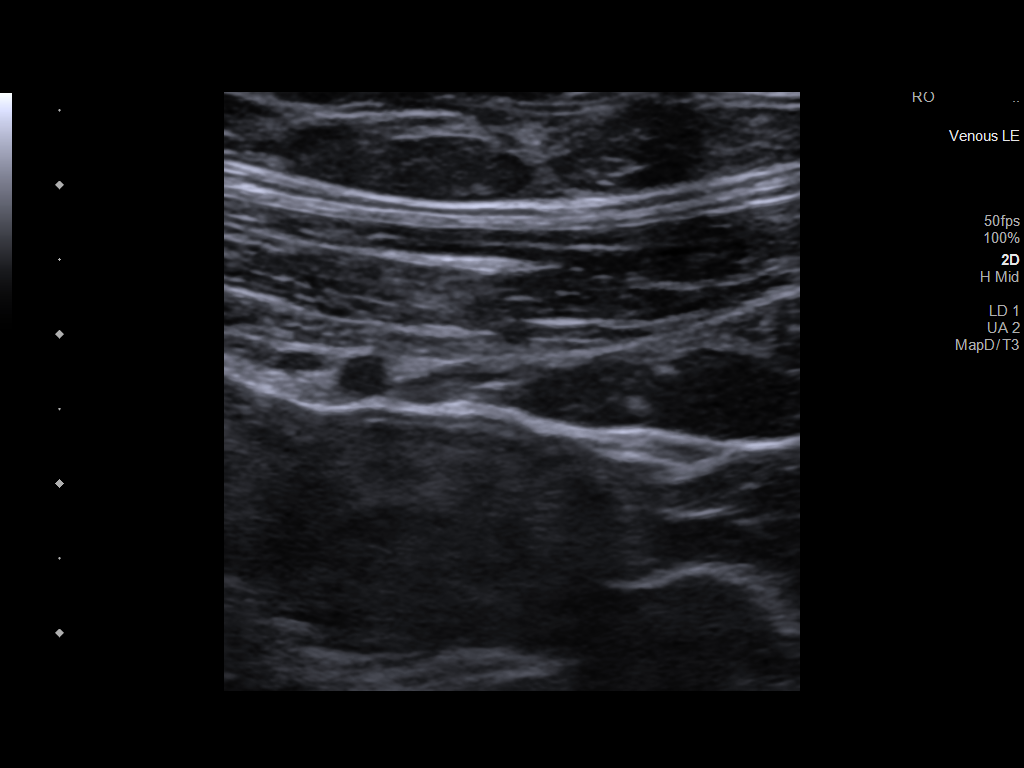
[im 2/7]
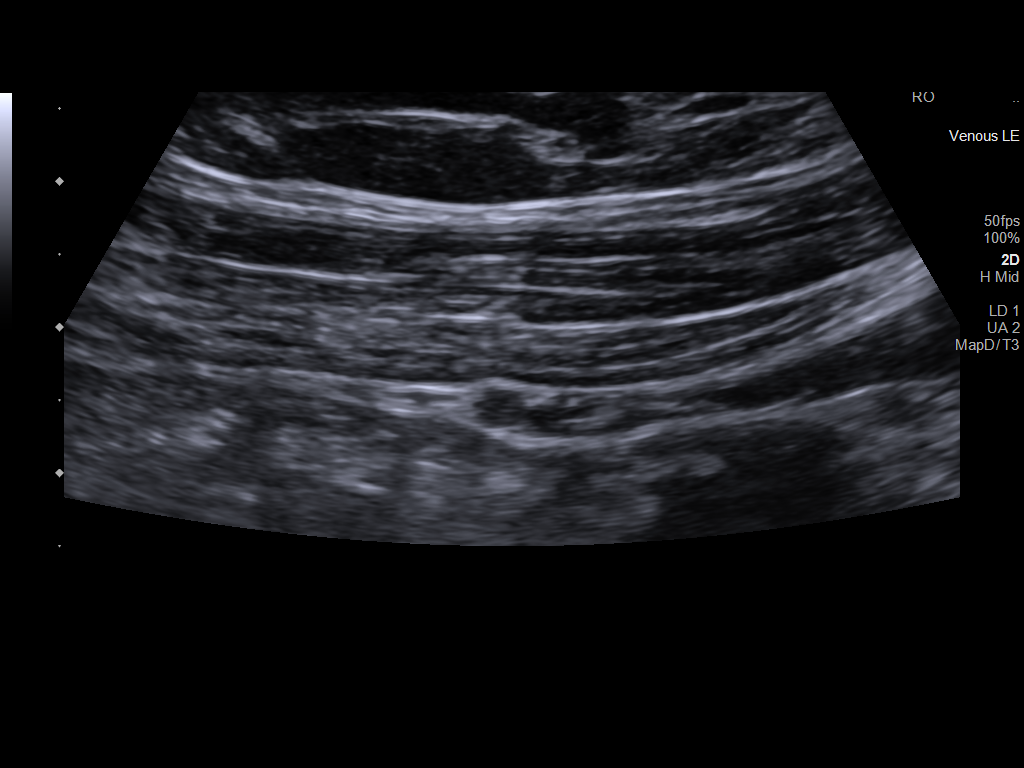
[im 3/7]
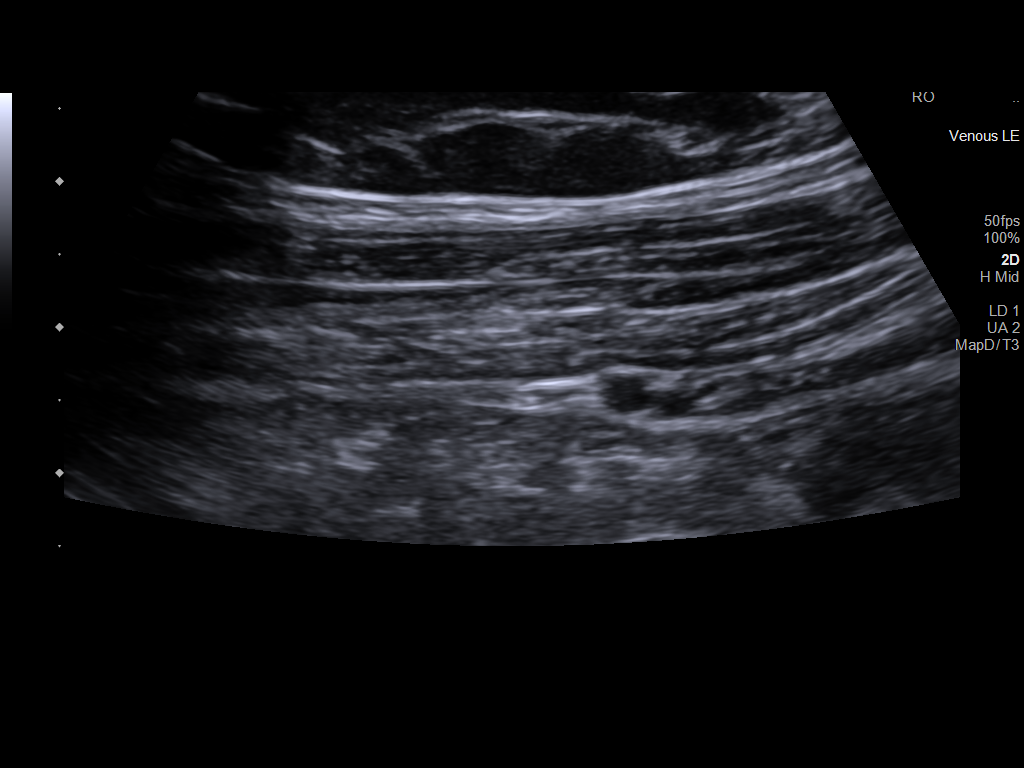
[im 4/7]
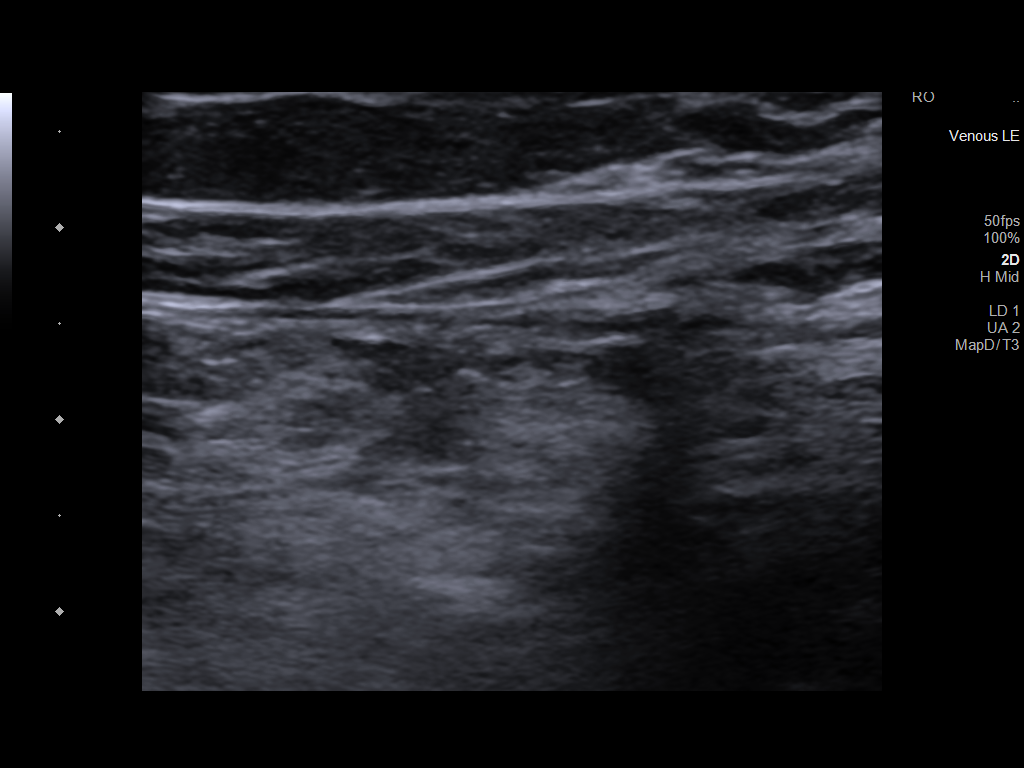
[im 5/7]
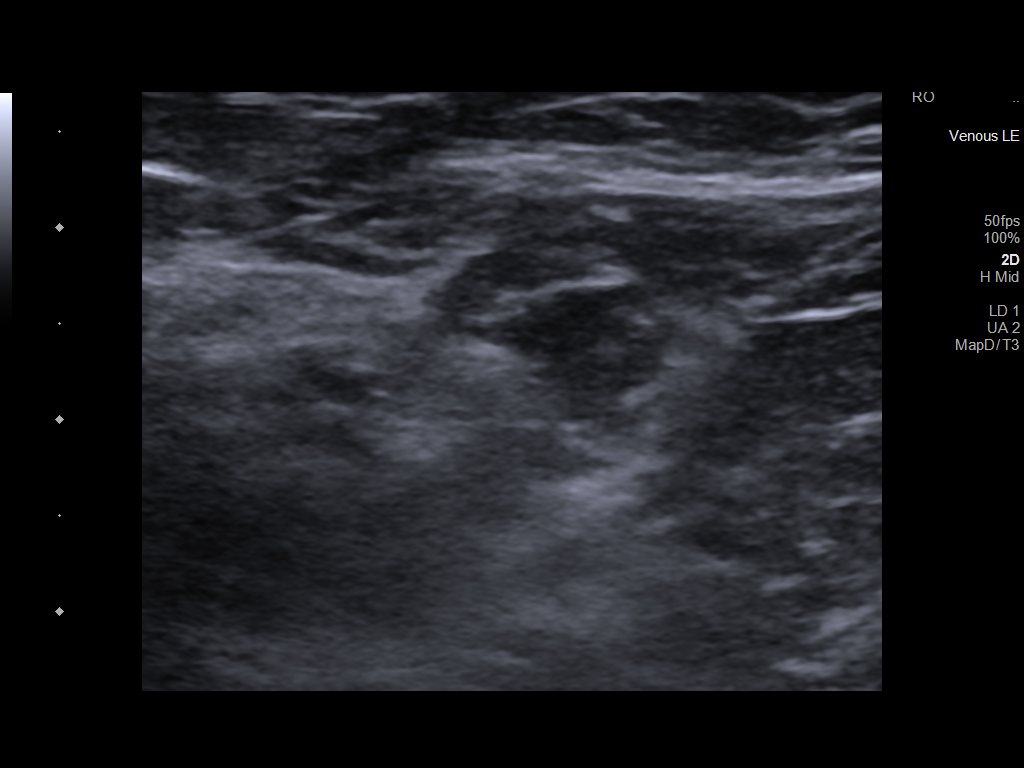
[im 6/7]
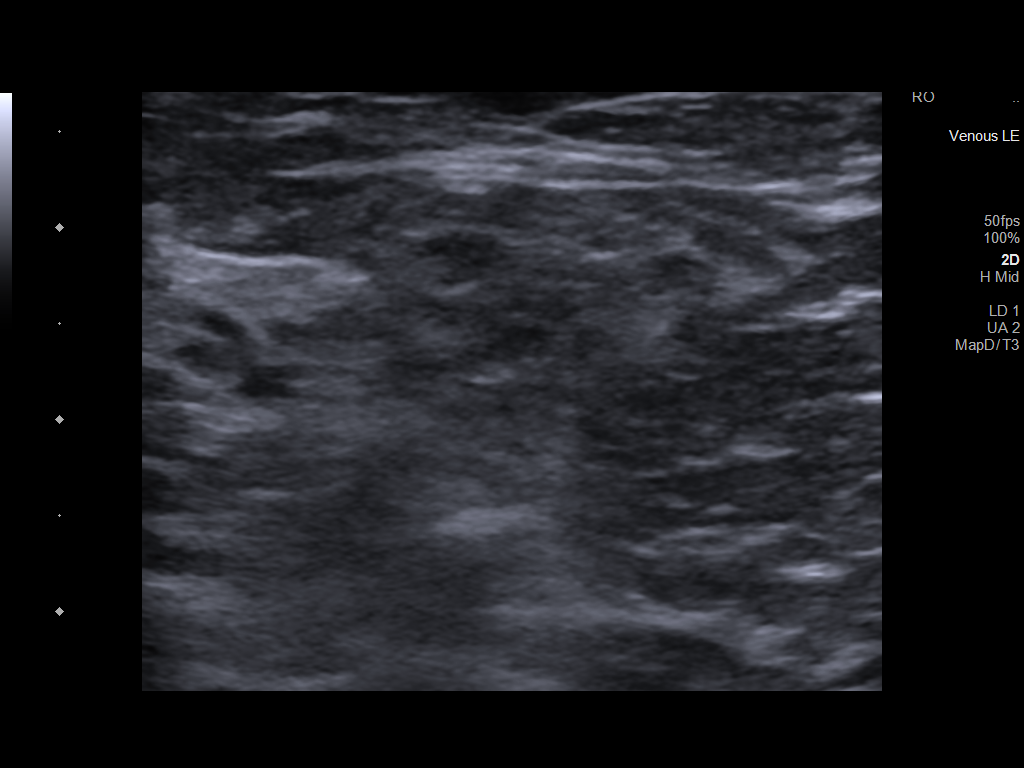
[im 7/7]
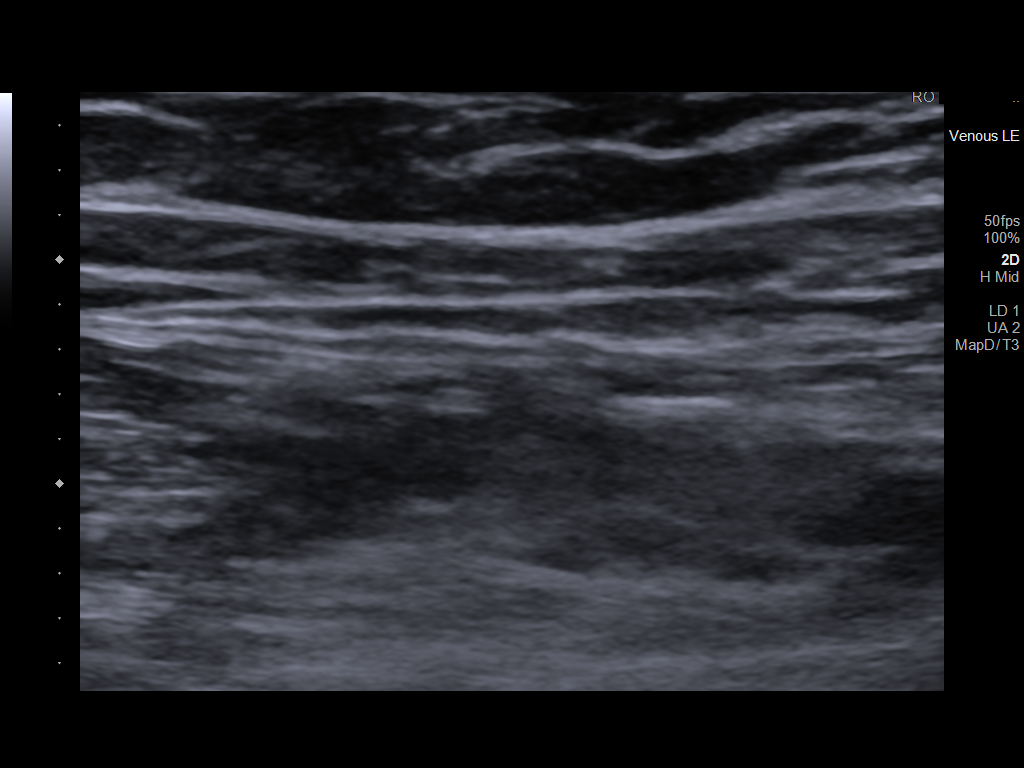

[7 of 7 positions shown; findings below may reference images not displayed]

FINDINGS: Sonographic interrogation of the region of clinical concern
demonstrates normal subcutaneous tissues. No abnormality identified.
IMPRESSION: Negative sonographic survey of the region of clinical concern.

## 2023-03-24 ENCOUNTER — Ambulatory Visit: Payer: PPO | Attending: Internal Medicine | Admitting: *Deleted

## 2023-03-24 DIAGNOSIS — Z5181 Encounter for therapeutic drug level monitoring: Secondary | ICD-10-CM

## 2023-03-24 DIAGNOSIS — I82433 Acute embolism and thrombosis of popliteal vein, bilateral: Secondary | ICD-10-CM | POA: Diagnosis not present

## 2023-03-24 DIAGNOSIS — Z7901 Long term (current) use of anticoagulants: Secondary | ICD-10-CM

## 2023-03-24 DIAGNOSIS — I2609 Other pulmonary embolism with acute cor pulmonale: Secondary | ICD-10-CM | POA: Diagnosis not present

## 2023-03-24 LAB — POCT INR: INR: 1.6 — AB (ref 2.0–3.0)

## 2023-03-24 IMAGING — CT CT ABD-PELV W/ CM
2 of 5 series · 16 of 46 positions shown, 18 images · IV contrast (Omnipaque or Isovue)
Comparison: 10/24/2021

CLINICAL DATA: Worsening left lower quadrant and groin pain for 2
weeks. Diverticulosis. Prostate carcinoma.

EXAM:
CT ABDOMEN AND PELVIS WITH CONTRAST
TECHNIQUE: Multidetector CT imaging of the abdomen and pelvis was performed
using the standard protocol following bolus administration of
intravenous contrast.

[Series 2: axial st · axial · 0.98mm/px · z∈[+876,+1331]mm · 13 of 103 slices shown, 15 images]
[im 6/103  soft-tissue]
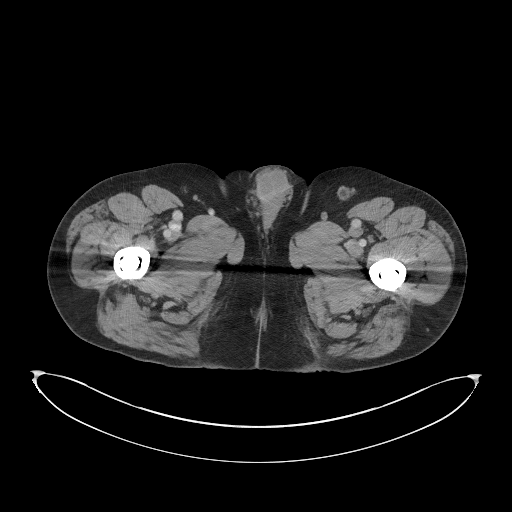
[im 6/103  bone]
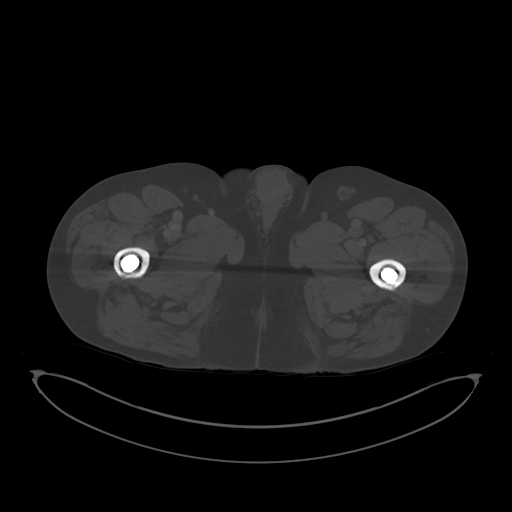
[im 16/103  soft-tissue]
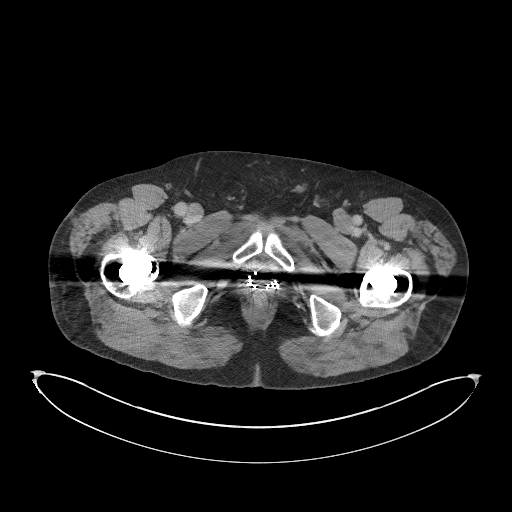
[im 21/103  soft-tissue]
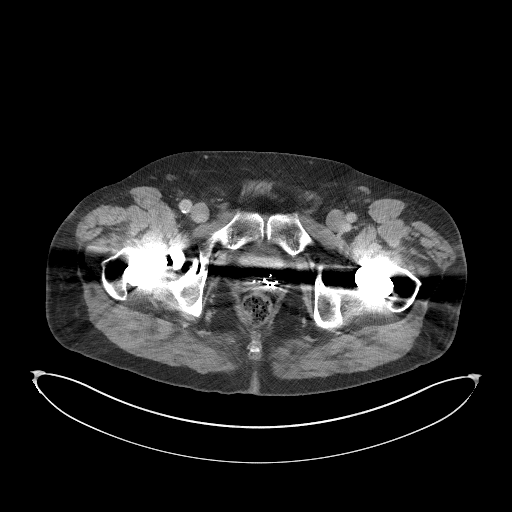
[im 31/103  soft-tissue]
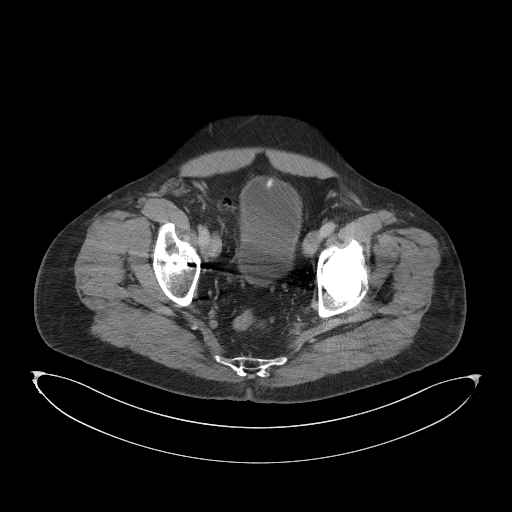
[im 36/103  soft-tissue]
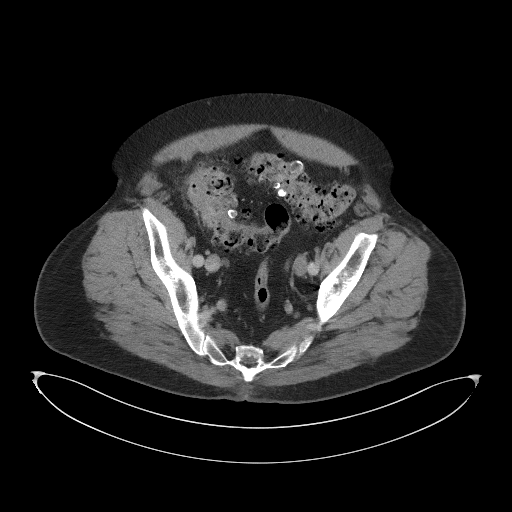
[im 46/103  soft-tissue]
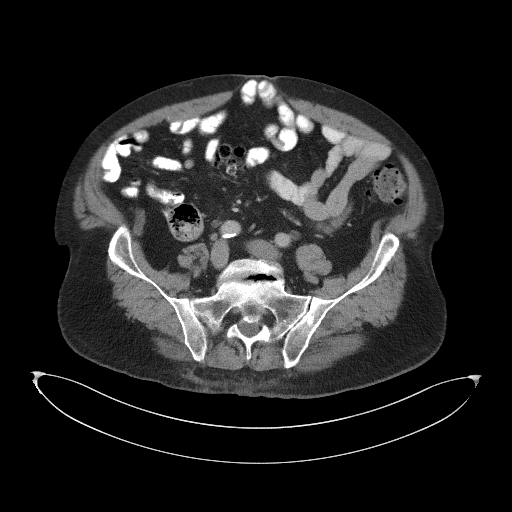
[im 52/103  soft-tissue]
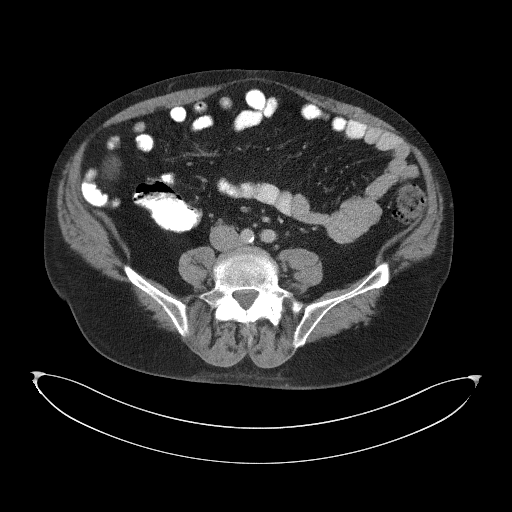
[im 57/103  soft-tissue]
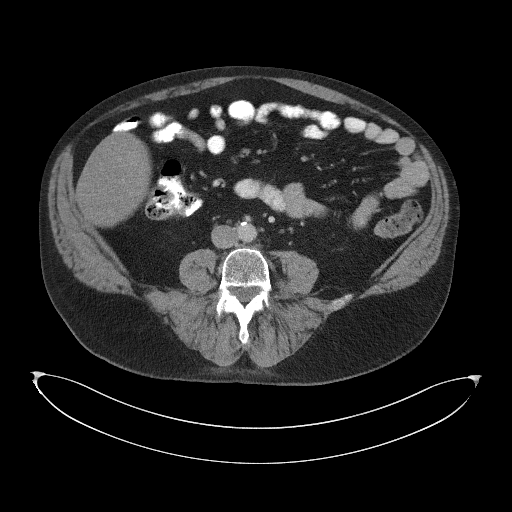
[im 67/103  soft-tissue]
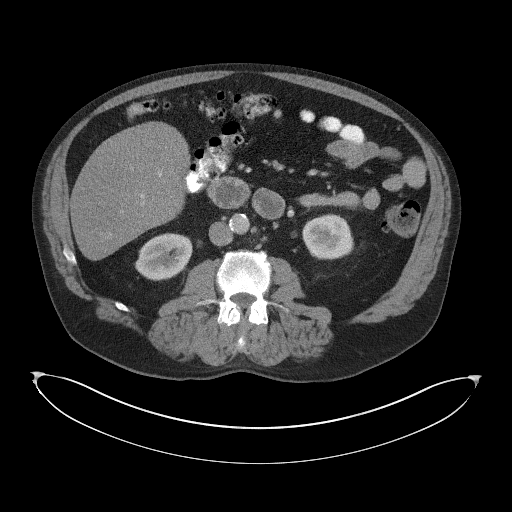
[im 67/103  bone]
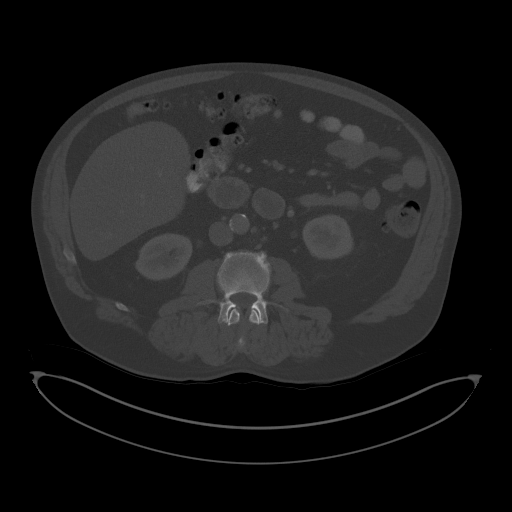
[im 72/103  soft-tissue]
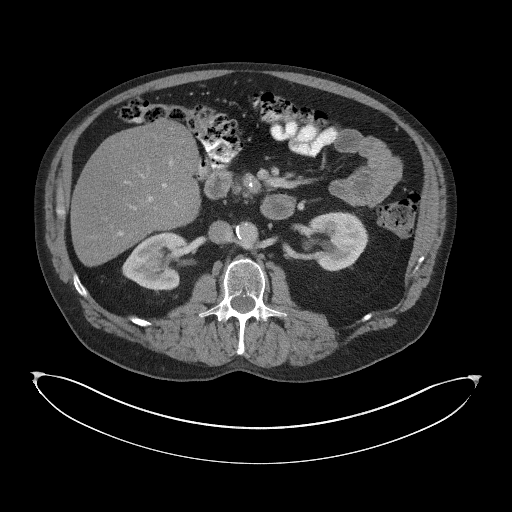
[im 82/103  soft-tissue]
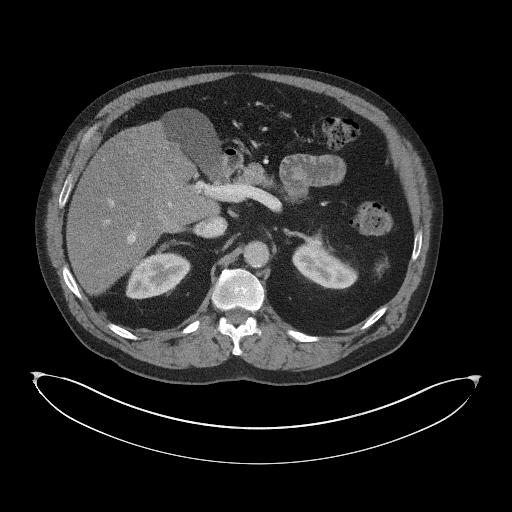
[im 87/103  soft-tissue]
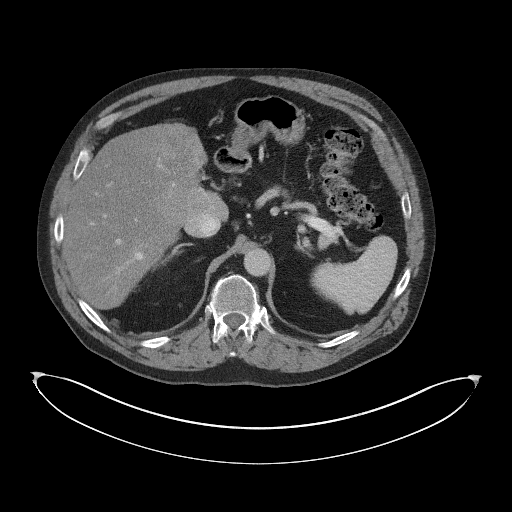
[im 97/103  soft-tissue]
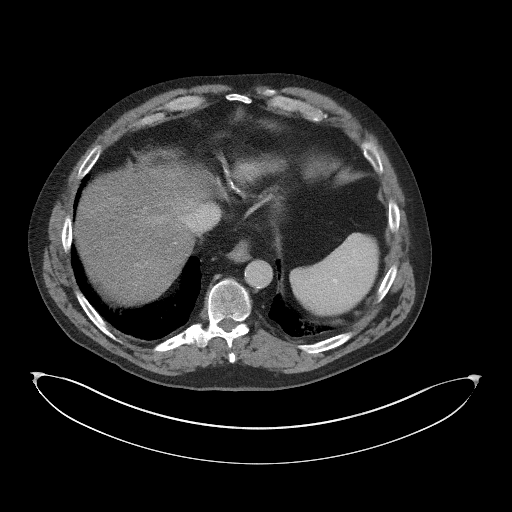

[Series 5: coronal st · coronal · 0.90mm/px · 3 of 123 slices shown]
[im 41/123  soft-tissue]
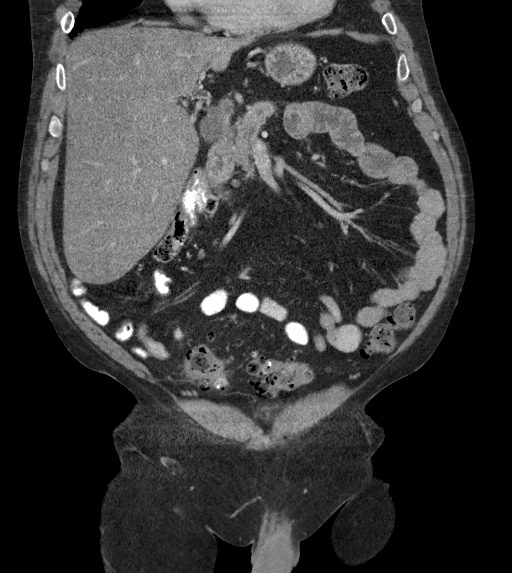
[im 55/123  soft-tissue]
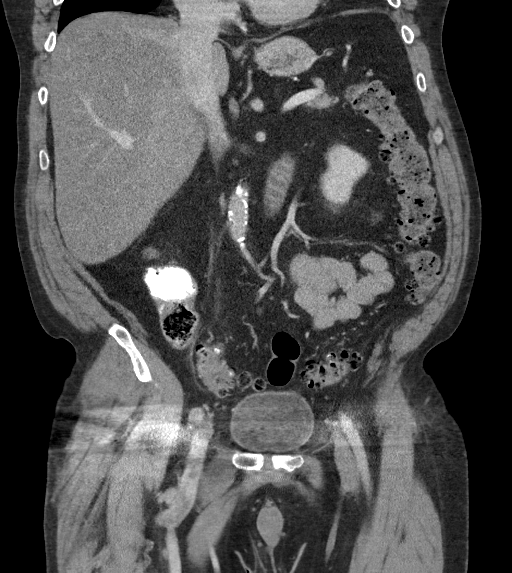
[im 68/123  soft-tissue]
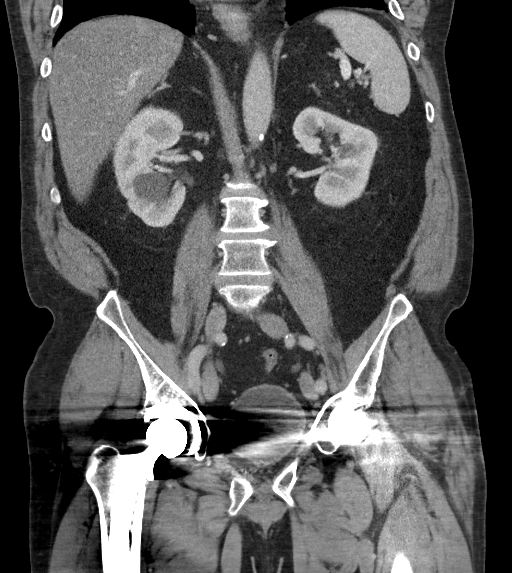

[16 of 46 positions shown; findings below may reference images not displayed]

RADIATION DOSE REDUCTION: This exam was performed according to the
departmental dose-optimization program which includes automated
exposure control, adjustment of the mA and/or kV according to
patient size and/or use of iterative reconstruction technique.

CONTRAST:  100mL OMNIPAQUE IOHEXOL 300 MG/ML  SOLN
FINDINGS: Lower Chest: No acute findings.

Hepatobiliary: No hepatic masses identified. Moderate diffuse
hepatic steatosis again noted. Atrophic left hepatic lobe again
seen. Gallbladder is unremarkable. No evidence of biliary ductal
dilatation.

Pancreas: No mass or inflammatory changes. No evidence of pancreatic
ductal dilatation. A few tiny calcifications are seen in the
pancreatic head and uncinate process, suspicious for chronic
pancreatitis.

Spleen: Within normal limits in size and appearance.

Adrenals/Urinary Tract: No masses identified. Stable small
benign-appearing bilateral renal cysts (no followup imaging
recommended). No evidence of ureteral calculi or hydronephrosis.
Unremarkable unopacified urinary bladder.

Stomach/Bowel: Tiny hiatal hernia again noted. No evidence of
obstruction, inflammatory process or abnormal fluid collections.
Normal appendix visualized. Diverticulosis is seen mainly involving
the descending and sigmoid colon, however there is no evidence of
diverticulitis.

Vascular/Lymphatic: No pathologically enlarged lymph nodes. No acute
vascular findings. Aortic atherosclerotic calcification incidentally
noted.

Reproductive: Brachytherapy seeds again seen within the prostate.
Limited visualization of the inferior pelvis due to severe artifact
from bilateral hip prostheses.

Other:  None.  No evidence of inguinal hernia or mass.

Musculoskeletal: No suspicious bone lesions identified. Bilateral
hip prostheses noted.
IMPRESSION: Colonic diverticulosis, without radiographic evidence of
diverticulitis or other acute findings.

Stable hepatic steatosis.

Aortic Atherosclerosis ([id number]).

## 2023-03-24 MED ORDER — WARFARIN SODIUM 2 MG PO TABS: ORAL_TABLET | ORAL | 1 refills | Status: DC

## 2023-03-24 NOTE — Patient Instructions (Signed)
Increase warfarin to 2 tablets daily Recheck in 3 weeks Started a plant based diet.  Eats 1 pound raw veggies every day.  Does not want to change diet.

## 2023-04-07 ENCOUNTER — Telehealth: Payer: Self-pay | Admitting: Family Medicine

## 2023-04-07 NOTE — Telephone Encounter (Signed)
Patient called in regard to visit scheduled on 10/25 wants to know if he needs labs done before his appointment.  No current labs in system.  Patient wants a call back.

## 2023-04-08 ENCOUNTER — Telehealth: Payer: Self-pay | Admitting: Family Medicine

## 2023-04-08 NOTE — Telephone Encounter (Signed)
Does patient need blood work done before his next appt. Call patient at [phone].

## 2023-04-09 ENCOUNTER — Ambulatory Visit: Payer: PPO | Attending: Internal Medicine | Admitting: *Deleted

## 2023-04-09 DIAGNOSIS — I82433 Acute embolism and thrombosis of popliteal vein, bilateral: Secondary | ICD-10-CM | POA: Diagnosis not present

## 2023-04-09 DIAGNOSIS — I2609 Other pulmonary embolism with acute cor pulmonale: Secondary | ICD-10-CM | POA: Diagnosis not present

## 2023-04-09 DIAGNOSIS — Z7901 Long term (current) use of anticoagulants: Secondary | ICD-10-CM

## 2023-04-09 DIAGNOSIS — Z5181 Encounter for therapeutic drug level monitoring: Secondary | ICD-10-CM | POA: Diagnosis not present

## 2023-04-09 LAB — POCT INR: INR: 3.6 — AB (ref 2.0–3.0)

## 2023-04-09 NOTE — Patient Instructions (Signed)
Hold warfarin tonight then decrease dose to 2 tablets daily except 1 tablet on Sundays Recheck in 3 weeks Started a plant based diet.  Eats 1 pound raw veggies every day.  Does not want to change diet.

## 2023-04-13 ENCOUNTER — Ambulatory Visit (INDEPENDENT_AMBULATORY_CARE_PROVIDER_SITE_OTHER): Payer: PPO

## 2023-04-13 VITALS — BP 105/62 | Ht 73.0 in | Wt 187.0 lb

## 2023-04-13 DIAGNOSIS — Z0000 Encounter for general adult medical examination without abnormal findings: Secondary | ICD-10-CM | POA: Diagnosis not present

## 2023-04-13 NOTE — Patient Instructions (Signed)
[name] , Thank you for taking time to come for your Medicare Wellness Visit. I appreciate your ongoing commitment to your health goals. Please review the following plan we discussed and let me know if I can assist you in the future.   Referrals/Orders/Follow-Ups/Clinician Recommendations:  You are due for the vaccines checked below. You may have these done at your preferred pharmacy. Please have them fax the office proof of the vaccines so that we can update your chart.   [x]  Flu (due annually) [x]  Shingrix (Shingles vaccine) []  Pneumonia Vaccines []  TDAP (Tetanus) Vaccine every 10 years [x]  Covid-19   This is a list of the screening recommended for you and due dates:  Health Maintenance  Topic Date Due   Zoster (Shingles) Vaccine (2 of 2) 07/08/2022   Flu Shot  03/05/2023   COVID-19 Vaccine (6 - 2023-24 season) 04/05/2023   Screening for Lung Cancer  10/26/2023   Medicare Annual Wellness Visit  04/12/2024   Colon Cancer Screening  02/15/2026   DTaP/Tdap/Td vaccine (2 - Td or Tdap) 08/22/2026   Pneumonia Vaccine  Completed   Hepatitis C Screening  Completed   HPV Vaccine  Aged Out    Advanced directives: (ACP Link)Information on Advanced Care Planning can be found at [facility] Advance Health Care Directives Advance Health Care Directives ([email or web address]   Next Medicare Annual Wellness Visit scheduled for next year: Yes Preventive Care 65 Years and Older, Male Preventive care refers to lifestyle choices and visits with your health care provider that can promote health and wellness. Preventive care visits are also called wellness exams. What can I expect for my preventive care visit? Counseling During your preventive care visit, your health care provider may ask about your: Medical history, including: Past medical problems. Family medical history. History of falls. Current health, including: Emotional well-being. Home life and relationship  well-being. Sexual activity. Memory and ability to understand (cognition). Lifestyle, including: Alcohol, nicotine or tobacco, and drug use. Access to firearms. Diet, exercise, and sleep habits. Work and work Astronomer. Sunscreen use. Safety issues such as seatbelt and bike helmet use. Physical exam Your health care provider will check your: Height and weight. These may be used to calculate your BMI (body mass index). BMI is a measurement that tells if you are at a healthy weight. Waist circumference. This measures the distance around your waistline. This measurement also tells if you are at a healthy weight and may help predict your risk of certain diseases, such as type 2 diabetes and high blood pressure. Heart rate and blood pressure. Body temperature. Skin for abnormal spots. What immunizations do I need?  Vaccines are usually given at various ages, according to a schedule. Your health care provider will recommend vaccines for you based on your age, medical history, and lifestyle or other factors, such as travel or where you work. What tests do I need? Screening Your health care provider may recommend screening tests for certain conditions. This may include: Lipid and cholesterol levels. Diabetes screening. This is done by checking your blood sugar (glucose) after you have not eaten for a while (fasting). Hepatitis C test. Hepatitis B test. HIV (human immunodeficiency virus) test. STI (sexually transmitted infection) testing, if you are at risk. Lung cancer screening. Colorectal cancer screening. Prostate cancer screening. Abdominal aortic aneurysm (AAA) screening. You may need this if you are a current or former smoker. Talk with your health care provider about your test results, treatment options, and if necessary, the  need for more tests. Follow these instructions at home: Eating and drinking  Eat a diet that includes fresh fruits and vegetables, whole grains, lean  protein, and low-fat dairy products. Limit your intake of foods with high amounts of sugar, saturated fats, and salt. Take vitamin and mineral supplements as recommended by your health care provider. Do not drink alcohol if your health care provider tells you not to drink. If you drink alcohol: Limit how much you have to 0-2 drinks a day. Know how much alcohol is in your drink. In the [place] one drink equals one 12 oz bottle of beer (355 mL), one 5 oz glass of wine (148 mL), or one 1 oz glass of hard liquor (44 mL). Lifestyle Brush your teeth every morning and night with fluoride toothpaste. Floss one time each day. Exercise for at least 30 minutes 5 or more days each week. Do not use any products that contain nicotine or tobacco. These products include cigarettes, chewing tobacco, and vaping devices, such as e-cigarettes. If you need help quitting, ask your health care provider. Do not use drugs. If you are sexually active, practice safe sex. Use a condom or other form of protection to prevent STIs. Take aspirin only as told by your health care provider. Make sure that you understand how much to take and what form to take. Work with your health care provider to find out whether it is safe and beneficial for you to take aspirin daily. Ask your health care provider if you need to take a cholesterol-lowering medicine (statin). Find healthy ways to manage stress, such as: Meditation, yoga, or listening to music. Journaling. Talking to a trusted person. Spending time with friends and family. Safety Always wear your seat belt while driving or riding in a vehicle. Do not drive: If you have been drinking alcohol. Do not ride with someone who has been drinking. When you are tired or distracted. While texting. If you have been using any mind-altering substances or drugs. Wear a helmet and other protective equipment during sports activities. If you have firearms in your house, make sure you follow  all gun safety procedures. Minimize exposure to UV radiation to reduce your risk of skin cancer. What's next? Visit your health care provider once a year for an annual wellness visit. Ask your health care provider how often you should have your eyes and teeth checked. Stay up to date on all vaccines. This information is not intended to replace advice given to you by your health care provider. Make sure you discuss any questions you have with your health care provider. Document Revised: 01/16/2021 Document Reviewed: 01/16/2021 Elsevier Patient Education  2024 [employer] Steps to Quit Smoking Smoking tobacco is the leading cause of preventable death. It can affect almost every organ in the body. Smoking puts you and people around you at risk for many serious, long-lasting (chronic) diseases. Quitting smoking can be hard, but it is one of the best things that you can do for your health. It is never too late to quit. Do not give up if you cannot quit the first time. Some people need to try many times to quit. Do your best to stick to your quit plan, and talk with your doctor if you have any questions or concerns. How do I get ready to quit? Pick a date to quit. Set a date within the next 2 weeks to give you time to prepare. Write down the reasons why you are quitting. Keep this  list in places where you will see it often. Tell your family, friends, and co-workers that you are quitting. Their support is important. Talk with your doctor about the choices that may help you quit. Find out if your health insurance will pay for these treatments. Know the people, places, things, and activities that make you want to smoke (triggers). Avoid them. What first steps can I take to quit smoking? Throw away all cigarettes at home, at work, and in your car. Throw away the things that you use when you smoke, such as ashtrays and lighters. Clean your car. Empty the ashtray. Clean your home, including curtains  and carpets. What can I do to help me quit smoking? Talk with your doctor about taking medicines and seeing a counselor. You are more likely to succeed when you do both. If you are pregnant or breastfeeding: Talk with your doctor about counseling or other ways to quit smoking. Do not take medicine to help you quit smoking unless your doctor tells you to. Quit right away Quit smoking completely, instead of slowly cutting back on how much you smoke over a period of time. Stopping smoking right away may be more successful than slowly quitting. Go to counseling. In-person is best if this is an option. You are more likely to quit if you go to counseling sessions regularly. Take medicine You may take medicines to help you quit. Some medicines need a prescription, and some you can buy over-the-counter. Some medicines may contain a drug called nicotine to replace the nicotine in cigarettes. Medicines may: Help you stop having the desire to smoke (cravings). Help to stop the problems that come when you stop smoking (withdrawal symptoms). Your doctor may ask you to use: Nicotine patches, gum, or lozenges. Nicotine inhalers or sprays. Non-nicotine medicine that you take by mouth. Find resources Find resources and other ways to help you quit smoking and remain smoke-free after you quit. They include: Online chats with a Veterinary surgeon. Phone quitlines. Printed Materials engineer. Support groups or group counseling. Text messaging programs. Mobile phone apps. Use apps on your mobile phone or tablet that can help you stick to your quit plan. Examples of free services include Quit Guide from the CDC and smokefree.gov  What can I do to make it easier to quit?  Talk to your family and friends. Ask them to support and encourage you. Call a phone quitline, such as 1-800-QUIT-NOW, reach out to support groups, or work with a Veterinary surgeon. Ask people who smoke to not smoke around you. Avoid places that make you  want to smoke, such as: Bars. Parties. Smoke-break areas at work. Spend time with people who do not smoke. Lower the stress in your life. Stress can make you want to smoke. Try these things to lower stress: Getting regular exercise. Doing deep-breathing exercises. Doing yoga. Meditating. What benefits will I see if I quit smoking? Over time, you may have: A better sense of smell and taste. Less coughing and sore throat. A slower heart rate. Lower blood pressure. Clearer skin. Better breathing. Fewer sick days. Summary Quitting smoking can be hard, but it is one of the best things that you can do for your health. Do not give up if you cannot quit the first time. Some people need to try many times to quit. When you decide to quit smoking, make a plan to help you succeed. Quit smoking right away, not slowly over a period of time. When you start quitting, get help and support  to keep you smoke-free. This information is not intended to replace advice given to you by your health care provider. Make sure you discuss any questions you have with your health care provider. Document Revised: 07/12/2021 Document Reviewed: 07/12/2021 Elsevier Patient Education  2024 [employer]. Understanding Your Risk for Falls Millions of people have serious injuries from falls each year. It is important to understand your risk of falling. Talk with your health care provider about your risk and what you can do to lower it. If you do have a serious fall, make sure to tell your provider. Falling once raises your risk of falling again. How can falls affect me? Serious injuries from falls are common. These include: Broken bones, such as hip fractures. Head injuries, such as traumatic brain injuries (TBI) or concussions. A fear of falling can cause you to avoid activities and stay at home. This can make your muscles weaker and raise your risk for a fall. What can increase my risk? There are a number of risk  factors that increase your risk for falling. The more risk factors you have, the higher your risk of falling. Serious injuries from a fall happen most often to people who are older than 66 years old. Teenagers and young adults ages 37-29 are also at higher risk. Common risk factors include: Weakness in the lower body. Being generally weak or confused due to long-term (chronic) illness. Dizziness or balance problems. Poor vision. Medicines that cause dizziness or drowsiness. These may include: Medicines for your blood pressure, heart, anxiety, insomnia, or swelling (edema). Pain medicines. Muscle relaxants. Other risk factors include: Drinking alcohol. Having had a fall in the past. Having foot pain or wearing improper footwear. Working at a dangerous job. Having any of the following in your home: Tripping hazards, such as floor clutter or loose rugs. Poor lighting. Pets. Having dementia or memory loss. What actions can I take to lower my risk of falling?     Physical activity Stay physically fit. Do strength and balance exercises. Consider taking a regular class to build strength and balance. Yoga and tai chi are good options. Vision Have your eyes checked every year and your prescription for glasses or contacts updated as needed. Shoes and walking aids Wear non-skid shoes. Wear shoes that have rubber soles and low heels. Do not wear high heels. Do not walk around the house in socks or slippers. Use a cane or walker as told by your provider. Home safety Attach secure railings on both sides of your stairs. Install grab bars for your bathtub, shower, and toilet. Use a non-skid mat in your bathtub or shower. Attach bath mats securely with double-sided, non-slip rug tape. Use good lighting in all rooms. Keep a flashlight near your bed. Make sure there is a clear path from your bed to the bathroom. Use night-lights. Do not use throw rugs. Make sure all carpeting is taped or tacked  down securely. Remove all clutter from walkways and stairways, including extension cords. Repair uneven or broken steps and floors. Avoid walking on icy or slippery surfaces. Walk on the grass instead of on icy or slick sidewalks. Use ice melter to get rid of ice on walkways in the winter. Use a cordless phone. Questions to ask your health care provider Can you help me check my risk for a fall? Do any of my medicines make me more likely to fall? Should I take a vitamin D supplement? What exercises can I do to improve my strength and  balance? Should I make an appointment to have my vision checked? Do I need a bone density test to check for weak bones (osteoporosis)? Would it help to use a cane or a walker? Where to find more information Centers for Disease Control and Prevention, STEADI: TonerPromos.no Community-Based Fall Prevention Programs: TonerPromos.no [employer] on Aging: BaseRingTones.pl Contact a health care provider if: You fall at home. You are afraid of falling at home. You feel weak, drowsy, or dizzy. This information is not intended to replace advice given to you by your health care provider. Make sure you discuss any questions you have with your health care provider. Document Revised: 03/24/2022 Document Reviewed: 03/24/2022 Elsevier Patient Education  2024 [employer].

## 2023-04-13 NOTE — Progress Notes (Signed)
 Because this visit was a virtual/telehealth visit,  certain criteria was not obtained, such a blood pressure, CBG if patient is a diabetic, and timed get up and go. Any medications not marked as "taking" was not mentioned during the medication reconciliation part of the visit. Any vitals not documented were not able to be obtained due to this being a telehealth visit. Vitals that have been documented are verbally provided by the patient.  Patient was unable to self-report a recent blood pressure reading due to a lack of equipment at home via telehealth.  Subjective:   [name] is a 67 y.o. male who presents for an Initial Medicare Annual Wellness Visit.  Visit Complete: Virtual  I connected with  [name] on 04/13/23 by a audio enabled telemedicine application and verified that I am speaking with the correct person using two identifiers.  Patient Location: Home  Provider Location: Home Office  I discussed the limitations of evaluation and management by telemedicine. The patient expressed understanding and agreed to proceed.  Patient Medicare AWV questionnaire was completed by the patient on 04/09/2023; I have confirmed that all information answered by patient is correct and no changes since this date.  Review of Systems     Cardiac Risk Factors include: advanced age (>35men, >30 women);male gender;Other (see comment), Risk factor comments: COPD, OSA     Objective:    Today's Vitals   04/13/23 1004  BP: 105/62  Weight: 187 lb (84.8 kg)  Height: 6\' 1"  (1.854 m)   Body mass index is 24.67 kg/m.     04/13/2023   10:10 AM 10/26/2022    9:27 AM 07/29/2022   10:00 AM 07/28/2022   10:24 PM 07/10/2022    8:42 AM 02/12/2022    4:50 PM 02/06/2022   11:02 AM  Advanced Directives  Does Patient Have a Medical Advance Directive? No Yes Yes Yes Yes Yes Yes  Type of Advance Directive  Living will;Healthcare Power of [employer] Power of [place];Living will Healthcare  Power of [place];Living will Healthcare Power of [place];Living will Healthcare Power of [employer] of [place];Living will  Does patient want to make changes to medical advance directive?  No - Patient declined No - Patient declined  No - Patient declined No - Patient declined   Copy of Healthcare Power of Attorney in Chart?  No - copy requested No - copy requested  No - copy requested No - copy requested No - copy requested  Would patient like information on creating a medical advance directive? No - Patient declined No - Patient declined         Current Medications (verified) Outpatient Encounter Medications as of 04/13/2023  Medication Sig   acetaminophen (TYLENOL) 500 MG tablet Take 2 tablets (1,000 mg total) by mouth every 6 (six) hours as needed for mild pain.   albuterol (PROVENTIL) (2.5 MG/3ML) 0.083% nebulizer solution Take 2.5 mg by nebulization every 6 (six) hours as needed for wheezing or shortness of breath.   albuterol (VENTOLIN HFA) 108 (90 Base) MCG/ACT inhaler Inhale 2 puffs into the lungs every 6 (six) hours as needed for wheezing or shortness of breath.   Fluticasone-Umeclidin-Vilant (TRELEGY ELLIPTA) 100-62.5-25 MCG/ACT AEPB INHALE 1 PUFF INTO LUNGS ONCE DAILY   ipratropium-albuterol (DUONEB) 0.5-2.5 (3) MG/3ML SOLN Take 3 mLs by nebulization 4 (four) times daily.   nitroGLYCERIN (NITROSTAT) 0.4 MG SL tablet Place 1 tablet (0.4 mg total) under the tongue every 5 (five) minutes as needed for chest  pain.   NON FORMULARY Pt uses a cpap nightly   warfarin (COUMADIN) 2 MG tablet Take 2 tablet by mouth daily or as directed   allopurinol (ZYLOPRIM) 300 MG tablet Take 300 mg by mouth daily.   doxycycline (VIBRAMYCIN) 50 MG capsule Take 50 mg by mouth daily. (Patient not taking: Reported on 04/13/2023)   rosuvastatin (CRESTOR) 20 MG tablet TAKE 1 TABLET(20 MG) BY MOUTH DAILY   tiZANidine (ZANAFLEX) 4 MG capsule Take 1 capsule (4 mg total) by mouth 2 (two) times daily as  needed for muscle spasms.   No facility-administered encounter medications on file as of 04/13/2023.    Allergies (verified) Patient has no known allergies.   History: Past Medical History:  Diagnosis Date   Bilateral shoulder bursitis    Chronic back pain    COPD (chronic obstructive pulmonary disease) with emphysema ([name])    DDD (degenerative disc disease), lumbar    hx epideral injection  L5--S1    Diverticulitis    DVT (deep venous thrombosis) ([name]) 11/19/2018   Dysphasia    intermittant w/ food    Dyspnea    Dyspnea on exertion    Emphysema of lung ([name]) 2012   Fatty liver    GERD (gastroesophageal reflux disease)    Gout    L great toe   Heterozygous factor V Leiden mutation ([name])    Hiatal hernia    Hip problem 2020   left, seeing orthopedics   History of acute pancreatitis    alcoholic pancreatitis 11-11-2011 and 01-30-2012. 2021   History of colon polyps    10-17-2005  hyperplastic polyp's   History of esophageal stricture    s/p  dilation 02-02-2017   History of peptic ulcer 1990s   History of pneumococcal pneumonia 2006   bilateral pneumonia w/ left lower lobe abscess treated w/ chest tube with suction for drainage   History of pneumothorax    1984--  left spontaneous pneumothorax ,  treated w/ chest tube   Hypertension    Malignant neoplasm of prostate [facility]) urologist-  dr dahlstedt/  oncologist -- dr Kathrynn Running   dx 12-02-2016-- Stage T2b, Gleason 3+4,  PSA 5.99,  vol 25cc   Neuropathy    Numbness of left foot    outer left ankle numb-- per pt has appt. w/ neurologist   OSA (obstructive sleep apnea)    per pt has not used cpap over year ago from 04-16-2017--- per study 01-17-2010  severe osa   Pneumonia    Sleep apnea 2013   Solid nodule of lung greater than 8 mm in diameter 05/11/2018   03/2018: RUL Nodule, 10mm   Wears glasses    Past Surgical History:  Procedure Laterality Date   APPENDECTOMY  July 2023   BIOPSY  02/02/2017   Procedure: BIOPSY;   Surgeon: Corbin Ade, MD;  [facility];  Service: Endoscopy;;  duodenal bx's, esophgeal bx's   BIOPSY  07/30/2017   Procedure: BIOPSY;  Surgeon: Corbin Ade, MD;  [facility];  Service: Endoscopy;;  esophagus   BIOPSY  02/15/2021   Procedure: BIOPSY;  Surgeon: Corbin Ade, MD;  [facility];  Service: Endoscopy;;   CARDIOVASCULAR STRESS TEST  09/12/2009   normal nuclear perfusion study w/ no ischemia /  normal LV function and wall motion , ef 57%   COLON SURGERY  July 2023   Colectomy   COLONOSCOPY  2007   Dr. Jena Gauss: hyperplastic polyps, internal hemorrhoids  COLONOSCOPY WITH PROPOFOL N/A 05/03/2018   Dr. Jena Gauss: diverticulosis in entire colon, multiple polyps in sigmoid, at splenic flexure, and ascending colon, not all polyps removed. Tubular adenomas and inflammatory polyps. Needs 3 year surveilance   COLONOSCOPY WITH PROPOFOL N/A 02/15/2021   Many polyps without adenomatous changes but all inflammatory in nature with mildly active colitis and chronic inflammatory mucosa.  Repeat in 5 years.   CORONARY PRESSURE/FFR STUDY N/A 08/01/2021   Procedure: INTRAVASCULAR PRESSURE WIRE/FFR STUDY;  Surgeon: Kathleene Hazel, MD;  [facility];  Service: Cardiovascular;  Laterality: N/A;   EPIDURAL BLOCK INJECTION  2020   ESOPHAGOGASTRODUODENOSCOPY (EGD) WITH PROPOFOL N/A 02/02/2017   Dr. Jena Gauss: esophageal stenosis s/p dilation and biopsy, medium sized hiatal hernia, normal duodenum   ESOPHAGOGASTRODUODENOSCOPY (EGD) WITH PROPOFOL N/A 07/30/2017   Dr. Jena Gauss: esophageal stenosis s/p dilation and biopsy, medium-sized hiatal hernia, normal duodenum   HEMORRHOID SURGERY  01-21-2008    [facility]   HIP ARTHROPLASTY Bilateral    IR RADIOLOGIST EVAL & MGMT  08/29/2020   JOINT REPLACEMENT  Hip joint 2020/2021   LEFT HEART CATH AND CORONARY ANGIOGRAPHY N/A 08/01/2021   Procedure: LEFT HEART CATH AND CORONARY ANGIOGRAPHY;  Surgeon:  Kathleene Hazel, MD;  [facility]: [facility];  Service: Cardiovascular;  Laterality: N/A;   MALONEY DILATION N/A 02/02/2017   Procedure: Elease Hashimoto DILATION;  Surgeon: Corbin Ade, MD;  [facility];  Service: Endoscopy;  Laterality: N/A;   MALONEY DILATION N/A 07/30/2017   Procedure: Elease Hashimoto DILATION;  Surgeon: Corbin Ade, MD;  [facility];  Service: Endoscopy;  Laterality: N/A;   POLYPECTOMY  05/03/2018   Procedure: POLYPECTOMY;  Surgeon: Corbin Ade, MD;  [facility];  Service: Endoscopy;;  ascending colon polyp, sigmoid colon polyps (multiple)   POLYPECTOMY  02/15/2021   Procedure: POLYPECTOMY;  Surgeon: Corbin Ade, MD;  [facility];  Service: Endoscopy;;   RADIOACTIVE SEED IMPLANT N/A 04/23/2017   Procedure: RADIOACTIVE SEED IMPLANT/BRACHYTHERAPY IMPLANT;  Surgeon: Marcine Matar, MD;  Location[facility];  Service: Urology;  Laterality: N/A;   SHOULDER ARTHROSCOPY  08/05/2003   SPACE OAR INSTILLATION N/A 04/23/2017   Procedure: SPACE OAR INSTILLATION;  Surgeon: Marcine Matar, MD;  Location: [facility];  Service: Urology;  Laterality: N/A;   TRANSTHORACIC ECHOCARDIOGRAM  03/28/2009   mild LVH, ef 55-60%/ mild aorta calcification   VASECTOMY  08/05/1983   VIDEO ASSISTED THORACOSCOPY (VATS)/DECORTICATION Left    Family History  Problem Relation Age of Onset   Diabetes Mother    Heart disease Father    Heart disease Brother    Heart disease Brother    Cancer Neg Hx    Colon cancer Neg Hx    Colon polyps Neg Hx    Neuropathy Neg Hx    Social History   Socioeconomic History   Marital status: Married    Spouse name: Not on file   Number of children: 2   Years of education: Not on file   Highest education level: Bachelor's degree (e.g., BA, AB, BS)  Occupational History   Not on file  Tobacco Use   Smoking status: Former    Current packs/day: 0.00    Average  packs/day: 1 pack/day for 30.0 years (30.0 ttl pk-yrs)    Types: Cigarettes, E-cigarettes    Start date: 10/02/1985    Quit date: 10/03/2015    Years since quitting: 7.5  Passive exposure: Past   Smokeless tobacco: Never   Tobacco comments:    Quit 7years ago  Vaping Use   Vaping status: Former  Substance and Sexual Activity   Alcohol use: Not Currently    Alcohol/week: 5.0 standard drinks of alcohol    Comment: No alcohol over 30 days   Drug use: No   Sexual activity: Yes    Birth control/protection: Other-see comments, None    Comment: Vasectomy  Other Topics Concern   Not on file  Social History Narrative   Lives at home with his wife   Right handed   Social Determinants of Health   Financial Resource Strain: Low Risk  (04/09/2023)   Overall Financial Resource Strain (CARDIA)    Difficulty of Paying Living Expenses: Not very hard  Food Insecurity: No Food Insecurity (04/09/2023)   Hunger Vital Sign    Worried About Running Out of Food in the Last Year: Never true    Ran Out of Food in the Last Year: Never true  Transportation Needs: No Transportation Needs (04/09/2023)   PRAPARE - Administrator, Civil Service (Medical): No    Lack of Transportation (Non-Medical): No  Physical Activity: Sufficiently Active (04/09/2023)   Exercise Vital Sign    Days of Exercise per Week: 7 days    Minutes of Exercise per Session: 30 min  Stress: No Stress Concern Present (04/09/2023)   [employer] of Occupational Health - Occupational Stress Questionnaire    Feeling of Stress : Not at all  Social Connections: Socially Integrated (04/09/2023)   Social Connection and Isolation Panel [NHANES]    Frequency of Communication with Friends and Family: More than three times a week    Frequency of Social Gatherings with Friends and Family: Three times a week    Attends Religious Services: 1 to 4 times per year    Active Member of Clubs or Organizations: Yes    Attends Museum/gallery exhibitions officer: More than 4 times per year    Marital Status: Married    Tobacco Counseling Counseling given: Yes Tobacco comments: Quit 7years ago   Clinical Intake:  Pre-visit preparation completed: Yes  Pain : No/denies pain     BMI - recorded: 24.67 Nutritional Status: BMI of 19-24  Normal Nutritional Risks: None Diabetes: No  How often do you need to have someone help you when you read instructions, pamphlets, or other written materials from your doctor or pharmacy?: 2 - Rarely  Interpreter Needed?: No  Information entered by ::  Duc Crocket, CMA   Activities of Daily Living    04/09/2023    7:57 PM 10/26/2022    1:00 PM  In your present state of health, do you have any difficulty performing the following activities:  Hearing? 1 1  Comment wears hearing aids   Vision? 0 1  Difficulty concentrating or making decisions? 0 0  Walking or climbing stairs? 0 0  Dressing or bathing? 0 0  Doing errands, shopping? 0 0  Preparing Food and eating ? N   Using the Toilet? N   In the past six months, have you accidently leaked urine? N   Do you have problems with loss of bowel control? N   Managing your Medications? N   Managing your Finances? N   Housekeeping or managing your Housekeeping? N     Patient Care Team: Del Newman Nip, Tenna Child, FNP as PCP - General (Family Medicine) Jena Gauss Gerrit Friends, MD as Consulting Physician (Gastroenterology)  Indicate any recent Medical Services you may have received from other than [facility] providers in the past year (date may be approximate).     Assessment:   This is a routine wellness examination for [name].  Hearing/Vision screen Hearing Screening - Comments:: Patient wears hearing aids  Vision Screening - Comments:: Wears rx glasses - up to date with routine eye exams with [facility]    Goals Addressed             This Visit's Progress    Patient Stated       Play more golf, get my boat wet,  and go fishing more, and my primary goal is to get a 6 pack        Depression Screen    04/13/2023   10:11 AM 03/04/2023    9:50 AM 01/28/2023    9:55 AM 01/05/2017    1:37 PM  PHQ 2/9 Scores  PHQ - 2 Score 0 0 0 0  PHQ- 9 Score 0 0 0     Fall Risk    04/09/2023    7:57 PM 03/04/2023    9:49 AM 01/28/2023    9:55 AM 09/02/2018   12:32 PM 12/30/2017    8:26 AM  Fall Risk   Falls in the past year? 0 0 0 0 No  Number falls in past yr: 0 0 0    Injury with Fall? 0 0 0    Risk for fall due to : No Fall Risks No Fall Risks No Fall Risks    Follow up Falls prevention discussed Falls evaluation completed Falls evaluation completed      MEDICARE RISK AT HOME: Medicare Risk at Home Any stairs in or around the home?: Yes If so, are there any without handrails?: No Home free of loose throw rugs in walkways, pet beds, electrical cords, etc?: Yes Life alert?: No Use of a cane, walker or w/c?: No Grab bars in the bathroom?: Yes Shower chair or bench in shower?: Yes Elevated toilet seat or a handicapped toilet?: No  TIMED UP AND GO:  Was the test performed? No    Cognitive Function:        04/13/2023   10:11 AM  6CIT Screen  What Year? 0 points  What month? 0 points  What time? 0 points  Count back from 20 0 points  Months in reverse 0 points  Repeat phrase 0 points  Total Score 0 points    Immunizations Immunization History  Administered Date(s) Administered   Covid-19, Mrna,Vaccine(Spikevax)7yrs and older 05/01/2022   Fluad Quad(high Dose 65+) 04/17/2022   Influenza Whole 05/30/2019   Moderna Covid-19 Vaccine Bivalent Booster 70yrs & up 06/03/2021   PFIZER(Purple Top)SARS-COV-2 Vaccination 10/27/2019, 11/19/2019, 06/01/2020   PNEUMOCOCCAL CONJUGATE-20 05/05/2022   Respiratory Syncytial Virus Vaccine,Recomb Aduvanted(Arexvy) 04/17/2022, 08/23/2022   Tdap 08/22/2016   Zoster Recombinant(Shingrix) 05/13/2022    TDAP status: Up to date  Flu Vaccine status: Due,  Education has been provided regarding the importance of this vaccine. Advised may receive this vaccine at local pharmacy or Health Dept. Aware to provide a copy of the vaccination record if obtained from local pharmacy or Health Dept. Verbalized acceptance and understanding.  Pneumococcal vaccine status: Up to date  Covid-19 vaccine status: Information provided on how to obtain vaccines.   Qualifies for Shingles Vaccine? Yes   Zostavax completed No   Shingrix Completed?: No.    Education has been provided regarding the importance of this vaccine.  Patient has been advised to call insurance company to determine out of pocket expense if they have not yet received this vaccine. Advised may also receive vaccine at local pharmacy or Health Dept. Verbalized acceptance and understanding.  Screening Tests Health Maintenance  Topic Date Due   Medicare Annual Wellness (AWV)  Never done   Zoster Vaccines- Shingrix (2 of 2) 07/08/2022   INFLUENZA VACCINE  03/05/2023   COVID-19 Vaccine (6 - 2023-24 season) 04/05/2023   Lung Cancer Screening  10/26/2023   DTaP/Tdap/Td (2 - Td or Tdap) 08/22/2026   Colonoscopy  02/16/2031   Pneumonia Vaccine 59+ Years old  Completed   Hepatitis C Screening  Completed   HPV VACCINES  Aged Out    Health Maintenance  Health Maintenance Due  Topic Date Due   Medicare Annual Wellness (AWV)  Never done   Zoster Vaccines- Shingrix (2 of 2) 07/08/2022   INFLUENZA VACCINE  03/05/2023   COVID-19 Vaccine (6 - 2023-24 season) 04/05/2023    Colorectal cancer screening: Type of screening: Colonoscopy. Completed 02/15/2021. Repeat every 5 years  Lung Cancer Screening: (Low Dose CT Chest recommended if Age 27-80 years, 20 pack-year currently smoking OR have quit w/in 15years.) does qualify.   Lung Cancer Screening Referral: Last Screening completed on 10/26/2022  Additional Screening:  Hepatitis C Screening: does not qualify; Completed 09/26/2021  Vision Screening:  Recommended annual ophthalmology exams for early detection of glaucoma and other disorders of the eye. Is the patient up to date with their annual eye exam?  Yes  Who is the provider or what is the name of the office in which the patient attends annual eye exams? [facility] If pt is not established with a provider, would they like to be referred to a provider to establish care? No .   Dental Screening: Recommended annual dental exams for proper oral hygiene  Diabetic Foot Exam: na  Community Resource Referral / Chronic Care Management: CRR required this visit?  No   CCM required this visit?  No    Plan:     I have personally reviewed and noted the following in the patient's chart:   Medical and social history Use of alcohol, tobacco or illicit drugs  Current medications and supplements including opioid prescriptions. Patient is not currently taking opioid prescriptions. Functional ability and status Nutritional status Physical activity Advanced directives List of other physicians Hospitalizations, surgeries, and ER visits in previous 12 months Vitals Screenings to include cognitive, depression, and falls Referrals and appointments  In addition, I have reviewed and discussed with patient certain preventive protocols, quality metrics, and best practice recommendations. A written personalized care plan for preventive services as well as general preventive health recommendations were provided to patient.     Jordan Hawks Andrue Dini, CMA   04/13/2023   After Visit Summary: (MyChart) Due to this being a telephonic visit, the after visit summary with patients personalized plan was offered to patient via MyChart   Nurse Notes:

## 2023-04-22 ENCOUNTER — Ambulatory Visit: Payer: PPO | Admitting: Internal Medicine

## 2023-04-30 ENCOUNTER — Ambulatory Visit: Payer: PPO | Attending: Internal Medicine | Admitting: *Deleted

## 2023-04-30 DIAGNOSIS — I82433 Acute embolism and thrombosis of popliteal vein, bilateral: Secondary | ICD-10-CM | POA: Diagnosis not present

## 2023-04-30 DIAGNOSIS — Z7901 Long term (current) use of anticoagulants: Secondary | ICD-10-CM | POA: Diagnosis not present

## 2023-04-30 DIAGNOSIS — Z5181 Encounter for therapeutic drug level monitoring: Secondary | ICD-10-CM | POA: Diagnosis not present

## 2023-04-30 DIAGNOSIS — I2609 Other pulmonary embolism with acute cor pulmonale: Secondary | ICD-10-CM | POA: Diagnosis not present

## 2023-04-30 LAB — POCT INR: INR: 2.2 (ref 2.0–3.0)

## 2023-04-30 NOTE — Patient Instructions (Signed)
Continue warfarin 2 tablets daily except 1 tablet on Sundays Recheck in 4 weeks Started a plant based diet.  Eats 1 pound raw veggies every day.  Does not want to change diet.

## 2023-05-05 ENCOUNTER — Encounter: Payer: PPO | Admitting: Internal Medicine

## 2023-05-05 NOTE — Progress Notes (Signed)
Erroneous encounter - please disregard.

## 2023-05-15 ENCOUNTER — Encounter: Payer: Self-pay | Admitting: Gastroenterology

## 2023-05-25 ENCOUNTER — Encounter: Payer: Self-pay | Admitting: Family Medicine

## 2023-05-26 ENCOUNTER — Other Ambulatory Visit: Payer: Self-pay | Admitting: Family Medicine

## 2023-05-26 DIAGNOSIS — R7301 Impaired fasting glucose: Secondary | ICD-10-CM

## 2023-05-26 DIAGNOSIS — Z136 Encounter for screening for cardiovascular disorders: Secondary | ICD-10-CM

## 2023-05-26 DIAGNOSIS — E559 Vitamin D deficiency, unspecified: Secondary | ICD-10-CM

## 2023-05-26 DIAGNOSIS — E038 Other specified hypothyroidism: Secondary | ICD-10-CM

## 2023-05-26 DIAGNOSIS — E538 Deficiency of other specified B group vitamins: Secondary | ICD-10-CM

## 2023-05-28 ENCOUNTER — Ambulatory Visit: Payer: PPO | Attending: Internal Medicine | Admitting: *Deleted

## 2023-05-28 DIAGNOSIS — Z7901 Long term (current) use of anticoagulants: Secondary | ICD-10-CM | POA: Diagnosis not present

## 2023-05-28 DIAGNOSIS — I2609 Other pulmonary embolism with acute cor pulmonale: Secondary | ICD-10-CM | POA: Diagnosis not present

## 2023-05-28 DIAGNOSIS — Z5181 Encounter for therapeutic drug level monitoring: Secondary | ICD-10-CM | POA: Diagnosis not present

## 2023-05-28 DIAGNOSIS — I82433 Acute embolism and thrombosis of popliteal vein, bilateral: Secondary | ICD-10-CM | POA: Diagnosis not present

## 2023-05-28 LAB — POCT INR: INR: 2.1 (ref 2.0–3.0)

## 2023-05-28 NOTE — Patient Instructions (Signed)
Continue warfarin 2 tablets daily except 1 tablet on Sundays Recheck in 4 weeks Started a plant based diet.  Eats 1 pound raw veggies every day.  Does not want to change diet.

## 2023-05-29 ENCOUNTER — Ambulatory Visit: Payer: PPO | Admitting: Family Medicine

## 2023-06-03 LAB — CMP14+EGFR
ALT: 23 [IU]/L (ref 0–44)
AST: 26 [IU]/L (ref 0–40)
Albumin: 4.5 g/dL (ref 3.9–4.9)
Alkaline Phosphatase: 71 [IU]/L (ref 44–121)
BUN/Creatinine Ratio: 13 (ref 10–24)
BUN: 14 mg/dL (ref 8–27)
Bilirubin Total: 0.6 mg/dL (ref 0.0–1.2)
CO2: 24 mmol/L (ref 20–29)
Calcium: 9.4 mg/dL (ref 8.6–10.2)
Chloride: 104 mmol/L (ref 96–106)
Creatinine, Ser: 1.07 mg/dL (ref 0.76–1.27)
Globulin, Total: 2.4 g/dL (ref 1.5–4.5)
Glucose: 98 mg/dL (ref 70–99)
Potassium: 3.8 mmol/L (ref 3.5–5.2)
Sodium: 144 mmol/L (ref 134–144)
Total Protein: 6.9 g/dL (ref 6.0–8.5)
eGFR: 77 mL/min/{1.73_m2} (ref >59)

## 2023-06-03 LAB — CBC WITH DIFFERENTIAL/PLATELET
Basophils Absolute: 0 10*3/uL (ref 0.0–0.2)
Basos: 1 %
EOS (ABSOLUTE): 0.3 10*3/uL (ref 0.0–0.4)
Eos: 4 %
Hematocrit: 45.7 % (ref 37.5–51.0)
Hemoglobin: 15 g/dL (ref 13.0–17.7)
Immature Grans (Abs): 0 10*3/uL (ref 0.0–0.1)
Immature Granulocytes: 1 %
Lymphocytes Absolute: 2 10*3/uL (ref 0.7–3.1)
Lymphs: 34 %
MCH: 31.4 pg (ref 26.6–33.0)
MCHC: 32.8 g/dL (ref 31.5–35.7)
MCV: 96 fL (ref 79–97)
Monocytes Absolute: 0.6 10*3/uL (ref 0.1–0.9)
Monocytes: 11 %
Neutrophils Absolute: 3 10*3/uL (ref 1.4–7.0)
Neutrophils: 49 %
Platelets: 168 10*3/uL (ref 150–450)
RBC: 4.78 x10E6/uL (ref 4.14–5.80)
RDW: 12.7 % (ref 11.6–15.4)
WBC: 6 10*3/uL (ref 3.4–10.8)

## 2023-06-03 LAB — LIPID PANEL
Chol/HDL Ratio: 2.6 ratio (ref 0.0–5.0)
Cholesterol, Total: 218 mg/dL — ABNORMAL HIGH (ref 100–199)
HDL: 83 mg/dL (ref >39)
LDL Chol Calc (NIH): 115 mg/dL — ABNORMAL HIGH (ref 0–99)
Triglycerides: 114 mg/dL (ref 0–149)
VLDL Cholesterol Cal: 20 mg/dL (ref 5–40)

## 2023-06-03 LAB — MICROALBUMIN / CREATININE URINE RATIO
Creatinine, Urine: 84.8 mg/dL
Microalb/Creat Ratio: 4 mg/g{creat} (ref 0–29)
Microalbumin, Urine: 3 ug/mL

## 2023-06-03 LAB — VITAMIN D 25 HYDROXY (VIT D DEFICIENCY, FRACTURES): Vit D, 25-Hydroxy: 32 ng/mL (ref 30.0–100.0)

## 2023-06-03 LAB — TSH+FREE T4
Free T4: 1.28 ng/dL (ref 0.82–1.77)
TSH: 1.86 u[IU]/mL (ref 0.450–4.500)

## 2023-06-03 LAB — HEMOGLOBIN A1C
Est. average glucose Bld gHb Est-mCnc: 120 mg/dL
Hgb A1c MFr Bld: 5.8 % — ABNORMAL HIGH (ref 4.8–5.6)

## 2023-06-03 LAB — VITAMIN B12: Vitamin B-12: 810 pg/mL (ref 232–1245)

## 2023-06-07 NOTE — Patient Instructions (Signed)

## 2023-06-07 NOTE — Progress Notes (Unsigned)
   Established Patient Office Visit   Subjective  Patient ID: [name], male    DOB: 1956-02-04  Age: 67 y.o. MRN: [id number]  No chief complaint on file.   He  has a past medical history of Bilateral shoulder bursitis, Chronic back pain, COPD (chronic obstructive pulmonary disease) with emphysema ([name]), DDD (degenerative disc disease), lumbar, Diverticulitis, DVT (deep venous thrombosis) ([name]) (11/19/2018), Dysphasia, Dyspnea, Dyspnea on exertion, Emphysema of lung ([name]) (2012), Fatty liver, GERD (gastroesophageal reflux disease), Gout, Heterozygous factor V Leiden mutation ([name]), Hiatal hernia, Hip problem (2020), History of acute pancreatitis, History of colon polyps, History of esophageal stricture, History of peptic ulcer (1990s), History of pneumococcal pneumonia (2006), History of pneumothorax, Hypertension, Malignant neoplasm of prostate [facility]) (urologist-  dr dahlstedt/  oncologist -- dr Kathrynn Running), Neuropathy, Numbness of left foot, OSA (obstructive sleep apnea), Pneumonia, Sleep apnea (2013), Solid nodule of lung greater than 8 mm in diameter (05/11/2018), and Wears glasses.  HPI  ROS    Objective:     There were no vitals taken for this visit. {Vitals History (Optional):23777}  Physical Exam   No results found for any visits on 06/08/23.  The 10-year ASCVD risk score (Arnett DK, et al., 2019) is: 9.1%    Assessment & Plan:  There are no diagnoses linked to this encounter.  No follow-ups on file.   Cruzita Lederer Newman Nip, FNP

## 2023-06-08 ENCOUNTER — Ambulatory Visit: Payer: PPO | Admitting: Family Medicine

## 2023-06-08 ENCOUNTER — Encounter: Payer: Self-pay | Admitting: Family Medicine

## 2023-06-08 ENCOUNTER — Ambulatory Visit: Payer: PPO | Admitting: Internal Medicine

## 2023-06-08 VITALS — BP 110/68 | HR 68 | Ht 73.0 in | Wt 187.1 lb

## 2023-06-08 DIAGNOSIS — E782 Mixed hyperlipidemia: Secondary | ICD-10-CM

## 2023-06-08 DIAGNOSIS — I739 Peripheral vascular disease, unspecified: Secondary | ICD-10-CM | POA: Diagnosis not present

## 2023-06-08 MED ORDER — OMEGA 3 1000 MG PO CAPS
2.0000 | ORAL_CAPSULE | Freq: Every day | ORAL | 3 refills | Status: DC
Start: 2023-06-08 — End: 2023-10-08

## 2023-06-08 NOTE — Assessment & Plan Note (Addendum)
Patient does not want to take statin  I advise the the importance of medication compliance to reduce cardiovascular complications. Patient refused and will continue lifestyle changes Patient agreed on counter omega-3 fish oil 1000 mg twice daily. Discussed lifestyle modifications follow diet low in saturated fat, reduce dietary salt intake, avoid fatty foods, maintain an exercise routine 3 to 5 days a week for a minimum total of 150 minutes.

## 2023-06-08 NOTE — Assessment & Plan Note (Signed)
Ankle-Brachial Index (ABI) for further evaluation and may refer to vascular surgery for further management. Discussed avoid smoking and engage in regular, moderate exercise like walking, which can improve circulation. Maintain a healthy diet low in saturated fats to help manage cholesterol and blood pressure. Keep legs warm, avoid prolonged sitting or standing, and monitor for any skin changes or sores

## 2023-06-10 ENCOUNTER — Ambulatory Visit (HOSPITAL_COMMUNITY)
Admission: RE | Admit: 2023-06-10 | Discharge: 2023-06-10 | Disposition: A | Discharge status: Home / Self Care | Payer: PPO | Source: Ambulatory Visit | Attending: Family Medicine | Admitting: Family Medicine

## 2023-06-10 DIAGNOSIS — I739 Peripheral vascular disease, unspecified: Secondary | ICD-10-CM | POA: Insufficient documentation

## 2023-06-18 ENCOUNTER — Encounter: Payer: Self-pay | Admitting: Internal Medicine

## 2023-06-29 ENCOUNTER — Ambulatory Visit: Payer: PPO | Attending: Internal Medicine | Admitting: *Deleted

## 2023-06-29 DIAGNOSIS — Z7901 Long term (current) use of anticoagulants: Secondary | ICD-10-CM | POA: Diagnosis not present

## 2023-06-29 DIAGNOSIS — I2609 Other pulmonary embolism with acute cor pulmonale: Secondary | ICD-10-CM

## 2023-06-29 DIAGNOSIS — I82433 Acute embolism and thrombosis of popliteal vein, bilateral: Secondary | ICD-10-CM | POA: Diagnosis not present

## 2023-06-29 DIAGNOSIS — Z5181 Encounter for therapeutic drug level monitoring: Secondary | ICD-10-CM

## 2023-06-29 LAB — POCT INR: INR: 2.8 (ref 2.0–3.0)

## 2023-06-29 NOTE — Patient Instructions (Signed)
Continue warfarin 2 tablets daily except 1 tablet on Sundays Recheck in 4 weeks Started a plant based diet.  Eats 1 pound raw veggies every day.  Going to start drinking V8 Juice to replace some vegetables and drinking some Cranberry Juice for inflammation.  Have warned of associate risk.

## 2023-07-01 ENCOUNTER — Other Ambulatory Visit: Payer: Self-pay | Admitting: Pulmonary Disease

## 2023-07-01 DIAGNOSIS — J449 Chronic obstructive pulmonary disease, unspecified: Secondary | ICD-10-CM

## 2023-07-06 ENCOUNTER — Telehealth: Payer: Self-pay

## 2023-07-06 NOTE — Telephone Encounter (Signed)
Pt called and lmom requesting a return call.

## 2023-07-07 ENCOUNTER — Telehealth: Payer: Self-pay | Admitting: Internal Medicine

## 2023-07-07 NOTE — Telephone Encounter (Signed)
LMOM for pt to call office  

## 2023-07-07 NOTE — Telephone Encounter (Signed)
Patient left a message saying it was his 3rd message.  He didn't say what the message was only he was calling.

## 2023-07-07 NOTE — Telephone Encounter (Signed)
I have called pt and lmom x2

## 2023-07-08 ENCOUNTER — Other Ambulatory Visit: Payer: Self-pay | Admitting: *Deleted

## 2023-07-08 ENCOUNTER — Encounter: Payer: Self-pay | Admitting: *Deleted

## 2023-07-08 DIAGNOSIS — Z8719 Personal history of other diseases of the digestive system: Secondary | ICD-10-CM

## 2023-07-09 NOTE — Telephone Encounter (Signed)
LMOM for pt to call office  

## 2023-07-16 ENCOUNTER — Other Ambulatory Visit: Payer: Self-pay | Admitting: Medical Genetics

## 2023-07-16 ENCOUNTER — Other Ambulatory Visit (HOSPITAL_COMMUNITY)
Admission: RE | Admit: 2023-07-16 | Discharge: 2023-07-16 | Disposition: A | Discharge status: Home / Self Care | Payer: Self-pay | Source: Ambulatory Visit | Attending: Oncology | Admitting: Oncology

## 2023-07-16 DIAGNOSIS — Z8719 Personal history of other diseases of the digestive system: Secondary | ICD-10-CM | POA: Insufficient documentation

## 2023-07-16 NOTE — Telephone Encounter (Signed)
noted 

## 2023-07-21 ENCOUNTER — Ambulatory Visit: Payer: PPO | Attending: Internal Medicine | Admitting: *Deleted

## 2023-07-21 DIAGNOSIS — I82433 Acute embolism and thrombosis of popliteal vein, bilateral: Secondary | ICD-10-CM

## 2023-07-21 DIAGNOSIS — Z7901 Long term (current) use of anticoagulants: Secondary | ICD-10-CM | POA: Diagnosis not present

## 2023-07-21 DIAGNOSIS — I2609 Other pulmonary embolism with acute cor pulmonale: Secondary | ICD-10-CM | POA: Diagnosis not present

## 2023-07-21 DIAGNOSIS — Z5181 Encounter for therapeutic drug level monitoring: Secondary | ICD-10-CM

## 2023-07-21 LAB — POCT INR: INR: 2.5 (ref 2.0–3.0)

## 2023-07-21 NOTE — Patient Instructions (Signed)
Continue warfarin 2 tablets daily except 1 tablet on Sundays Recheck in 5 weeks Started a plant based diet.  Eats 1 pound raw veggies every day.  Going to start drinking V8 Juice to replace some vegetables and drinking some Cranberry Juice for inflammation.  Have warned of associate risk.

## 2023-07-24 ENCOUNTER — Telehealth (INDEPENDENT_AMBULATORY_CARE_PROVIDER_SITE_OTHER): Payer: Self-pay | Admitting: *Deleted

## 2023-07-24 NOTE — Telephone Encounter (Signed)
Lmom for pt to return call. 

## 2023-07-24 NOTE — Telephone Encounter (Signed)
Patient returning your call.  [phone]

## 2023-07-25 LAB — GENECONNECT MOLECULAR SCREEN: Genetic Analysis Overall Interpretation: NEGATIVE

## 2023-07-27 NOTE — Telephone Encounter (Signed)
Unable to reach pt by phone.

## 2023-07-30 ENCOUNTER — Ambulatory Visit (HOSPITAL_COMMUNITY)
Admission: RE | Admit: 2023-07-30 | Discharge: 2023-07-30 | Disposition: A | Discharge status: Home / Self Care | Payer: PPO | Source: Ambulatory Visit | Attending: Gastroenterology | Admitting: Gastroenterology

## 2023-07-30 DIAGNOSIS — K861 Other chronic pancreatitis: Secondary | ICD-10-CM | POA: Insufficient documentation

## 2023-07-30 DIAGNOSIS — Z8719 Personal history of other diseases of the digestive system: Secondary | ICD-10-CM | POA: Diagnosis present

## 2023-07-30 MED ORDER — IOHEXOL 300 MG/ML  SOLN
100.0000 mL | Freq: Once | INTRAMUSCULAR | Status: AC | PRN
  Administered 2023-07-30: 100 mL via INTRAVENOUS

## 2023-08-20 ENCOUNTER — Ambulatory Visit (HOSPITAL_COMMUNITY)
Admission: RE | Admit: 2023-08-20 | Discharge: 2023-08-20 | Disposition: A | Discharge status: Home / Self Care | Payer: PPO | Source: Ambulatory Visit | Attending: Acute Care | Admitting: Acute Care

## 2023-08-20 DIAGNOSIS — Z87891 Personal history of nicotine dependence: Secondary | ICD-10-CM | POA: Insufficient documentation

## 2023-08-20 DIAGNOSIS — Z122 Encounter for screening for malignant neoplasm of respiratory organs: Secondary | ICD-10-CM | POA: Insufficient documentation

## 2023-08-25 ENCOUNTER — Other Ambulatory Visit: Payer: Self-pay

## 2023-08-25 ENCOUNTER — Telehealth: Payer: Self-pay

## 2023-08-25 ENCOUNTER — Telehealth: Payer: Self-pay | Admitting: Acute Care

## 2023-08-25 ENCOUNTER — Ambulatory Visit: Payer: PPO | Attending: Internal Medicine | Admitting: *Deleted

## 2023-08-25 DIAGNOSIS — I2609 Other pulmonary embolism with acute cor pulmonale: Secondary | ICD-10-CM | POA: Diagnosis not present

## 2023-08-25 DIAGNOSIS — I82433 Acute embolism and thrombosis of popliteal vein, bilateral: Secondary | ICD-10-CM

## 2023-08-25 DIAGNOSIS — Z122 Encounter for screening for malignant neoplasm of respiratory organs: Secondary | ICD-10-CM

## 2023-08-25 DIAGNOSIS — Z87891 Personal history of nicotine dependence: Secondary | ICD-10-CM

## 2023-08-25 DIAGNOSIS — Z5181 Encounter for therapeutic drug level monitoring: Secondary | ICD-10-CM

## 2023-08-25 DIAGNOSIS — Z7901 Long term (current) use of anticoagulants: Secondary | ICD-10-CM

## 2023-08-25 DIAGNOSIS — F17210 Nicotine dependence, cigarettes, uncomplicated: Secondary | ICD-10-CM

## 2023-08-25 DIAGNOSIS — R911 Solitary pulmonary nodule: Secondary | ICD-10-CM

## 2023-08-25 LAB — POCT INR: INR: 2.3 (ref 2.0–3.0)

## 2023-08-25 MED ORDER — WARFARIN SODIUM 2 MG PO TABS: ORAL_TABLET | ORAL | 1 refills | Status: DC

## 2023-08-25 NOTE — Telephone Encounter (Signed)
Spoke with patient. Reviewed recent CT results. He is aware that he has a new nodule measuring 5.9 mm. He is in agreement to repeat scan in 6 months. Results and plan sent to PCP.

## 2023-08-25 NOTE — Telephone Encounter (Signed)
Tiffany calling with call report. For CT scan.Tiffany phone number is [phone]. 

## 2023-08-25 NOTE — Patient Instructions (Signed)
Continue warfarin 2 tablets daily except 1 tablet on Sundays Recheck in 6 weeks.  

## 2023-08-25 NOTE — Telephone Encounter (Signed)
Patient called, see additional message

## 2023-08-25 NOTE — Telephone Encounter (Signed)
Call report from [facility]  Lung-RADS 3, probably benign findings. 5.9 mm left upper lobe nodule is new in the interval.Short-term follow-up in 6 months is recommended with repeat low-dose chest CT without contrast (please use the following order, "CT CHEST LCS NODULE FOLLOW-UP W/O CM").   Aortic Atherosclerosis (ICD10-I70.0) and Emphysema (ICD10-J43.9).   These results will be called to the ordering clinician or representative by the Radiologist Assistant, and communication documented in the PACS or [employer].

## 2023-09-17 ENCOUNTER — Encounter: Payer: Self-pay | Admitting: Family Medicine

## 2023-09-17 ENCOUNTER — Ambulatory Visit (INDEPENDENT_AMBULATORY_CARE_PROVIDER_SITE_OTHER): Payer: PPO | Admitting: Family Medicine

## 2023-09-17 ENCOUNTER — Ambulatory Visit (HOSPITAL_COMMUNITY)
Admission: RE | Admit: 2023-09-17 | Discharge: 2023-09-17 | Disposition: A | Discharge status: Home / Self Care | Payer: PPO | Source: Ambulatory Visit | Attending: Family Medicine | Admitting: Family Medicine

## 2023-09-17 VITALS — BP 121/78 | HR 72 | Resp 16 | Ht 73.0 in | Wt 189.1 lb

## 2023-09-17 DIAGNOSIS — M25512 Pain in left shoulder: Secondary | ICD-10-CM | POA: Insufficient documentation

## 2023-09-17 MED ORDER — HYDROCODONE-ACETAMINOPHEN 5-325 MG PO TABS
1.0000 | ORAL_TABLET | Freq: Four times a day (QID) | ORAL | 0 refills | Status: DC | PRN
Start: 2023-09-17 — End: 2024-02-23

## 2023-09-17 NOTE — Assessment & Plan Note (Signed)
A prescription for Hydrocodone-Acetaminophen 5-325 mg has been sent to the patient's  pharmacy to take for left shoulder pain greater than 7/10, every six hours as needed. A referral has been placed to physical therapy for stretching exercises  Encouraged to stop by [facility] to get an X-ray of the left shoulder to assess for any underlying pathology. Recommended Nonpharmacological Interventions Avoid overuse and repetitive movements that worsen the pain. Modify activities that strain the shoulder, such as lifting heavy objects or prolonged overhead movements. Ice Therapy: Apply an ice pack wrapped in a towel for 15-20 minutes every 2-3 hours to reduce inflammation and swelling  Heat Therapy: Use a warm compress or heating pad for 15-20 minutes to relax muscles and improve blood flow When to Seek Medical Attention  Inability to move the shoulder or arm. Numbness, tingling, or weakness in the arm or hand. Pain that persists or worsens despite self-care measures. Please follow up as needed or if symptoms worsen. Let me know if you have any questions!

## 2023-09-17 NOTE — Progress Notes (Signed)
Acute Office Visit  Subjective:    Patient ID: [name], male    DOB: 12-16-1955, 68 y.o.   MRN: [id number]  Chief Complaint  Patient presents with   Shoulder Pain    X 1 week, starts at top of shoulder and goes down the back of his arm to his elbow. Aches sometimes sharp. Limited ROM. Has tried heat and muscle rub and rest but it seems to be getting worse     HPI The patient is seen today with complaints of left shoulder pain for approximately one week. The pain is rated 8 out of 10 in the clinic and is described as aching and dull. The patient reports that the pain is constant and aggravating, worsened with activity and movement, and improves with rest and heat application at the affected site. He denies any recent injury or trauma but notes repetitive motion of the left shoulder, stating that his daily activities involve frequent lifting of the arms and shoulders. There are no reports of numbness, tingling, or weakness in the left shoulder or arm.    Past Medical History:  Diagnosis Date   Bilateral shoulder bursitis    Chronic back pain    COPD (chronic obstructive pulmonary disease) with emphysema ([name])    DDD (degenerative disc disease), lumbar    hx epideral injection  L5--S1    Diverticulitis    DVT (deep venous thrombosis) ([name]) 11/19/2018   Dysphasia    intermittant w/ food    Dyspnea    Dyspnea on exertion    Emphysema of lung ([name]) 2012   Fatty liver    GERD (gastroesophageal reflux disease)    Gout    L great toe   Heterozygous factor V Leiden mutation ([name])    Hiatal hernia    Hip problem 2020   left, seeing orthopedics   History of acute pancreatitis    alcoholic pancreatitis 11-11-2011 and 01-30-2012. 2021   History of colon polyps    10-17-2005  hyperplastic polyp's   History of esophageal stricture    s/p  dilation 02-02-2017   History of peptic ulcer 1990s   History of pneumococcal pneumonia 2006   bilateral pneumonia w/ left lower lobe  abscess treated w/ chest tube with suction for drainage   History of pneumothorax    1984--  left spontaneous pneumothorax ,  treated w/ chest tube   Hypertension    Malignant neoplasm of prostate [facility]) urologist-  dr dahlstedt/  oncologist -- dr Kathrynn Running   dx 12-02-2016-- Stage T2b, Gleason 3+4,  PSA 5.99,  vol 25cc   Neuropathy    Numbness of left foot    outer left ankle numb-- per pt has appt. w/ neurologist   OSA (obstructive sleep apnea)    per pt has not used cpap over year ago from 04-16-2017--- per study 01-17-2010  severe osa   Pneumonia    Sleep apnea 2013   Solid nodule of lung greater than 8 mm in diameter 05/11/2018   03/2018: RUL Nodule, 10mm   Wears glasses     Past Surgical History:  Procedure Laterality Date   APPENDECTOMY  July 2023   BIOPSY  02/02/2017   Procedure: BIOPSY;  Surgeon: Corbin Ade, MD;  [facility];  Service: Endoscopy;;  duodenal bx's, esophgeal bx's   BIOPSY  07/30/2017   Procedure: BIOPSY;  Surgeon: Corbin Ade, MD;  [facility]: [facility];  Service: Endoscopy;;  esophagus   BIOPSY  02/15/2021  Procedure: BIOPSY;  Surgeon: Corbin Ade, MD;  [facility]: [facility];  Service: Endoscopy;;   CARDIOVASCULAR STRESS TEST  09/12/2009   normal nuclear perfusion study w/ no ischemia /  normal LV function and wall motion , ef 57%   COLON SURGERY  July 2023   Colectomy   COLONOSCOPY  2007   Dr. Jena Gauss: hyperplastic polyps, internal hemorrhoids    COLONOSCOPY WITH PROPOFOL N/A 05/03/2018   Dr. Jena Gauss: diverticulosis in entire colon, multiple polyps in sigmoid, at splenic flexure, and ascending colon, not all polyps removed. Tubular adenomas and inflammatory polyps. Needs 3 year surveilance   COLONOSCOPY WITH PROPOFOL N/A 02/15/2021   Many polyps without adenomatous changes but all inflammatory in nature with mildly active colitis and chronic inflammatory mucosa.  Repeat in 5 years.   CORONARY PRESSURE/FFR STUDY N/A 08/01/2021    Procedure: INTRAVASCULAR PRESSURE WIRE/FFR STUDY;  Surgeon: Kathleene Hazel, MD;  [facility]: [facility];  Service: Cardiovascular;  Laterality: N/A;   EPIDURAL BLOCK INJECTION  2020   ESOPHAGOGASTRODUODENOSCOPY (EGD) WITH PROPOFOL N/A 02/02/2017   Dr. Jena Gauss: esophageal stenosis s/p dilation and biopsy, medium sized hiatal hernia, normal duodenum   ESOPHAGOGASTRODUODENOSCOPY (EGD) WITH PROPOFOL N/A 07/30/2017   Dr. Jena Gauss: esophageal stenosis s/p dilation and biopsy, medium-sized hiatal hernia, normal duodenum   HEMORRHOID SURGERY  01-21-2008    [facility]   HIP ARTHROPLASTY Bilateral    IR RADIOLOGIST EVAL & MGMT  08/29/2020   JOINT REPLACEMENT  Hip joint 2020/2021   LEFT HEART CATH AND CORONARY ANGIOGRAPHY N/A 08/01/2021   Procedure: LEFT HEART CATH AND CORONARY ANGIOGRAPHY;  Surgeon: Kathleene Hazel, MD;  Location: [facility];  Service: Cardiovascular;  Laterality: N/A;   MALONEY DILATION N/A 02/02/2017   Procedure: Elease Hashimoto DILATION;  Surgeon: Corbin Ade, MD;  [facility];  Service: Endoscopy;  Laterality: N/A;   MALONEY DILATION N/A 07/30/2017   Procedure: Elease Hashimoto DILATION;  Surgeon: Corbin Ade, MD;  [facility];  Service: Endoscopy;  Laterality: N/A;   POLYPECTOMY  05/03/2018   Procedure: POLYPECTOMY;  Surgeon: Corbin Ade, MD;  [facility];  Service: Endoscopy;;  ascending colon polyp, sigmoid colon polyps (multiple)   POLYPECTOMY  02/15/2021   Procedure: POLYPECTOMY;  Surgeon: Corbin Ade, MD;  [facility];  Service: Endoscopy;;   RADIOACTIVE SEED IMPLANT N/A 04/23/2017   Procedure: RADIOACTIVE SEED IMPLANT/BRACHYTHERAPY IMPLANT;  Surgeon: Marcine Matar, MD;  Location: [facility];  Service: Urology;  Laterality: N/A;   SHOULDER ARTHROSCOPY  08/05/2003   SPACE OAR INSTILLATION N/A 04/23/2017   Procedure: SPACE OAR INSTILLATION;  Surgeon: Marcine Matar, MD;  Location:  [facility];  Service: Urology;  Laterality: N/A;   TRANSTHORACIC ECHOCARDIOGRAM  03/28/2009   mild LVH, ef 55-60%/ mild aorta calcification   VASECTOMY  08/05/1983   VIDEO ASSISTED THORACOSCOPY (VATS)/DECORTICATION Left     Family History  Problem Relation Age of Onset   Diabetes Mother    Heart disease Father    Heart disease Brother    Heart disease Brother    Cancer Neg Hx    Colon cancer Neg Hx    Colon polyps Neg Hx    Neuropathy Neg Hx     Social History   Socioeconomic History   Marital status: Married    Spouse name: Not on file   Number of children: 2   Years of education: Not on file   Highest  education level: Associate degree: occupational, Scientist, product/process development, or vocational program  Occupational History   Not on file  Tobacco Use   Smoking status: Former    Current packs/day: 0.00    Average packs/day: 1 pack/day for 30.0 years (30.0 ttl pk-yrs)    Types: Cigarettes, E-cigarettes    Start date: 10/02/1985    Quit date: 10/03/2015    Years since quitting: 7.9    Passive exposure: Past   Smokeless tobacco: Never   Tobacco comments:    Quit 7years ago  Vaping Use   Vaping status: Former  Substance and Sexual Activity   Alcohol use: Not Currently    Alcohol/week: 5.0 standard drinks of alcohol    Comment: No alcohol over 30 days   Drug use: No   Sexual activity: Yes    Birth control/protection: Other-see comments, None    Comment: Vasectomy  Other Topics Concern   Not on file  Social History Narrative   Lives at home with his wife   Right handed   Social Drivers of Health   Financial Resource Strain: Low Risk  (06/07/2023)   Overall Financial Resource Strain (CARDIA)    Difficulty of Paying Living Expenses: Not hard at all  Food Insecurity: No Food Insecurity (06/07/2023)   Hunger Vital Sign    Worried About Running Out of Food in the Last Year: Never true    Ran Out of Food in the Last Year: Never true  Transportation Needs: No Transportation  Needs (06/07/2023)   PRAPARE - Administrator, Civil Service (Medical): No    Lack of Transportation (Non-Medical): No  Physical Activity: Sufficiently Active (04/09/2023)   Exercise Vital Sign    Days of Exercise per Week: 7 days    Minutes of Exercise per Session: 30 min  Stress: No Stress Concern Present (04/09/2023)   [employer] of Occupational Health - Occupational Stress Questionnaire    Feeling of Stress : Not at all  Social Connections: Socially Integrated (06/07/2023)   Social Connection and Isolation Panel [NHANES]    Frequency of Communication with Friends and Family: More than three times a week    Frequency of Social Gatherings with Friends and Family: More than three times a week    Attends Religious Services: More than 4 times per year    Active Member of [employer] or Organizations: Yes    Attends Engineer, structural: More than 4 times per year    Marital Status: Married  Catering manager Violence: Not At Risk (04/13/2023)   Humiliation, Afraid, Rape, and Kick questionnaire    Fear of Current or Ex-Partner: No    Emotionally Abused: No    Physically Abused: No    Sexually Abused: No    Outpatient Medications Prior to Visit  Medication Sig Dispense Refill   acetaminophen (TYLENOL) 500 MG tablet Take 2 tablets (1,000 mg total) by mouth every 6 (six) hours as needed for mild pain. 30 tablet 0   albuterol (VENTOLIN HFA) 108 (90 Base) MCG/ACT inhaler Inhale 2 puffs into the lungs every 6 (six) hours as needed for wheezing or shortness of breath.     Fluticasone-Umeclidin-Vilant (TRELEGY ELLIPTA) 100-62.5-25 MCG/ACT AEPB INHALE 1 PUFF INTO LUNGS ONCE DAILY 60 each 2   ipratropium-albuterol (DUONEB) 0.5-2.5 (3) MG/3ML SOLN Take 3 mLs by nebulization 4 (four) times daily.     NON FORMULARY Pt uses a cpap nightly     Omega 3 1000 MG CAPS Take 2 capsules (2,000 mg total)  by mouth daily. 60 capsule 3   psyllium (METAMUCIL) 58.6 % powder Take 1 packet by mouth  daily.     warfarin (COUMADIN) 2 MG tablet Take 2 tablet by mouth daily or as directed 180 tablet 1   albuterol (PROVENTIL) (2.5 MG/3ML) 0.083% nebulizer solution Take 2.5 mg by nebulization every 6 (six) hours as needed for wheezing or shortness of breath.     nitroGLYCERIN (NITROSTAT) 0.4 MG SL tablet Place 1 tablet (0.4 mg total) under the tongue every 5 (five) minutes as needed for chest pain. 100 tablet 3   doxycycline (VIBRAMYCIN) 50 MG capsule Take 50 mg by mouth daily.     No facility-administered medications prior to visit.    No Known Allergies  Review of Systems  Constitutional:  Negative for fatigue and fever.  Eyes:  Negative for visual disturbance.  Respiratory:  Negative for chest tightness and shortness of breath.   Cardiovascular:  Negative for chest pain and palpitations.  Musculoskeletal:        Left shoulder pain  Neurological:  Negative for dizziness and headaches.       Objective:    Physical Exam HENT:     Head: Normocephalic.     Right Ear: External ear normal.     Left Ear: External ear normal.     Nose: No congestion or rhinorrhea.     Mouth/Throat:     Mouth: Mucous membranes are moist.  Cardiovascular:     Rate and Rhythm: Regular rhythm.     Heart sounds: No murmur heard. Pulmonary:     Effort: No respiratory distress.     Breath sounds: Normal breath sounds.  Musculoskeletal:     Left shoulder: Tenderness present. No swelling or deformity. Normal range of motion. Normal strength.       Arms:     Comments: On examination, range of motion is limited due to pain, but he is able to perform active and passive movements of the left shoulder.   Neurological:     Mental Status: He is alert.     BP 121/78   Pulse 72   Resp 16   Ht 6\' 1"  (1.854 m)   Wt 189 lb 1.9 oz (85.8 kg)   SpO2 94%   BMI 24.95 kg/m  Wt Readings from Last 3 Encounters:  09/17/23 189 lb 1.9 oz (85.8 kg)  06/08/23 187 lb 1.3 oz (84.9 kg)  04/13/23 187 lb (84.8 kg)        Assessment & Plan:  Acute pain of left shoulder Assessment & Plan: A prescription for Hydrocodone-Acetaminophen 5-325 mg has been sent to the patient's  pharmacy to take for left shoulder pain greater than 7/10, every six hours as needed. A referral has been placed to physical therapy for stretching exercises  Encouraged to stop by [facility] to get an X-ray of the left shoulder to assess for any underlying pathology. Recommended Nonpharmacological Interventions Avoid overuse and repetitive movements that worsen the pain. Modify activities that strain the shoulder, such as lifting heavy objects or prolonged overhead movements. Ice Therapy: Apply an ice pack wrapped in a towel for 15-20 minutes every 2-3 hours to reduce inflammation and swelling  Heat Therapy: Use a warm compress or heating pad for 15-20 minutes to relax muscles and improve blood flow When to Seek Medical Attention  Inability to move the shoulder or arm. Numbness, tingling, or weakness in the arm or hand. Pain that persists or worsens despite self-care measures. Please  follow up as needed or if symptoms worsen. Let me know if you have any questions!  Orders: -     HYDROcodone-Acetaminophen; Take 1 tablet by mouth every 6 (six) hours as needed for moderate pain (pain score 4-6).  Dispense: 20 tablet; Refill: 0 -     Ambulatory referral to Physical Therapy -     DG Shoulder Left  Note: This chart has been completed using Engineer, civil (consulting) software, and while attempts have been made to ensure accuracy, certain words and phrases may not be transcribed as intended.    Gilmore Laroche, FNP

## 2023-09-17 NOTE — Patient Instructions (Addendum)
I appreciate the opportunity to provide care to you today!  Left Shoulder Pain Management Plan A prescription for Hydrocodone-Acetaminophen 5-325 mg has been sent to your pharmacy to take for left shoulder pain greater than 7/10, every six hours as needed. A referral has been placed to physical therapy for further evaluation and treatment. Please stop by [facility] to get an X-ray of the left shoulder to assess for any underlying pathology. Recommended Nonpharmacological Interventions Avoid overuse and repetitive movements that worsen the pain. Modify activities that strain the shoulder, such as lifting heavy objects or prolonged overhead movements. Ice Therapy: Apply an ice pack wrapped in a towel for 15-20 minutes every 2-3 hours to reduce inflammation and swelling (best for acute pain). Heat Therapy: Use a warm compress or heating pad for 15-20 minutes to relax muscles and improve blood flow (best for chronic pain or stiffness). When to Seek Medical Attention  Inability to move the shoulder or arm. Numbness, tingling, or weakness in the arm or hand. Pain that persists or worsens despite self-care measures. Please follow up as needed or if symptoms worsen. Let me know if you have any questions!   Referrals today-  physical therapy  Attached with your AVS, you will find valuable resources for self-education. I highly recommend dedicating some time to thoroughly examine them.   Please continue to a heart-healthy diet and increase your physical activities. Try to exercise for at least five days a week.    It was a pleasure to see you and I look forward to continuing to work together on your health and well-being. Please do not hesitate to call the office if you need care or have questions about your care.  In case of emergency, please visit the Emergency Department for urgent care, or contact our clinic at [phone] to schedule an appointment. We're here to help you!    Have a wonderful day and week. With Gratitude, Gilmore Laroche MSN, FNP-BC

## 2023-09-21 ENCOUNTER — Other Ambulatory Visit: Payer: Self-pay | Admitting: Family Medicine

## 2023-09-21 DIAGNOSIS — M25512 Pain in left shoulder: Secondary | ICD-10-CM

## 2023-09-21 NOTE — Progress Notes (Unsigned)
 am

## 2023-09-24 ENCOUNTER — Telehealth: Payer: Self-pay | Admitting: Family Medicine

## 2023-09-24 NOTE — Telephone Encounter (Signed)
Copied from CRM [id number]. Topic: General - Call Back - No Documentation >> Sep 24, 2023  1:44 PM Higinio Roger wrote: Reason for CRM: Patient is requesting callback to discuss xrays of shoulder and what his next steps are. Callback #: [phone]

## 2023-09-25 NOTE — Telephone Encounter (Signed)
Results have not yet been read by the radiologist. They are experiencing a delay but someone will reach out when the results are available

## 2023-09-28 ENCOUNTER — Telehealth: Payer: Self-pay | Admitting: Family Medicine

## 2023-09-28 NOTE — Telephone Encounter (Signed)
 Copied from CRM [id number]. Topic: Clinical - Request for Lab/Test Order >> Sep 28, 2023  2:36 PM Gery Pray wrote: Reason for CRM: Patient called requesting that he has a complete blood panel ordered for his appointment on 03/06. Patient would like to discuss results in the upcoming appointment on 03/06. Patient's callback is [phone]

## 2023-09-29 ENCOUNTER — Other Ambulatory Visit: Payer: Self-pay | Admitting: Pulmonary Disease

## 2023-09-29 DIAGNOSIS — J449 Chronic obstructive pulmonary disease, unspecified: Secondary | ICD-10-CM

## 2023-10-01 ENCOUNTER — Other Ambulatory Visit: Payer: Self-pay | Admitting: Family Medicine

## 2023-10-01 DIAGNOSIS — D509 Iron deficiency anemia, unspecified: Secondary | ICD-10-CM

## 2023-10-01 DIAGNOSIS — E559 Vitamin D deficiency, unspecified: Secondary | ICD-10-CM

## 2023-10-01 DIAGNOSIS — Z13220 Encounter for screening for lipoid disorders: Secondary | ICD-10-CM

## 2023-10-01 DIAGNOSIS — E038 Other specified hypothyroidism: Secondary | ICD-10-CM

## 2023-10-01 NOTE — Telephone Encounter (Signed)
 Patient calling again to get orders for labs before his upcoming appointment. Please advise Thank you

## 2023-10-01 NOTE — Telephone Encounter (Signed)
 Labs have been entered, and pt has been made aware to come in to office at least 5 days before appt on 03/06 and to please fast.

## 2023-10-03 LAB — CBC WITH DIFFERENTIAL/PLATELET
Basophils Absolute: 0.1 10*3/uL (ref 0.0–0.2)
Basos: 1 %
EOS (ABSOLUTE): 0.2 10*3/uL (ref 0.0–0.4)
Eos: 3 %
Hematocrit: 43.1 % (ref 37.5–51.0)
Hemoglobin: 15.1 g/dL (ref 13.0–17.7)
Immature Grans (Abs): 0 10*3/uL (ref 0.0–0.1)
Immature Granulocytes: 0 %
Lymphocytes Absolute: 2.2 10*3/uL (ref 0.7–3.1)
Lymphs: 41 %
MCH: 32.2 pg (ref 26.6–33.0)
MCHC: 35 g/dL (ref 31.5–35.7)
MCV: 92 fL (ref 79–97)
Monocytes Absolute: 0.6 10*3/uL (ref 0.1–0.9)
Monocytes: 11 %
Neutrophils Absolute: 2.3 10*3/uL (ref 1.4–7.0)
Neutrophils: 44 %
Platelets: 130 10*3/uL — ABNORMAL LOW (ref 150–450)
RBC: 4.69 x10E6/uL (ref 4.14–5.80)
RDW: 12.7 % (ref 11.6–15.4)
WBC: 5.2 10*3/uL (ref 3.4–10.8)

## 2023-10-03 LAB — TSH+FREE T4
Free T4: 1.2 ng/dL (ref 0.82–1.77)
TSH: 1.38 u[IU]/mL (ref 0.450–4.500)

## 2023-10-03 LAB — LIPID PANEL
Chol/HDL Ratio: 2.6 ratio (ref 0.0–5.0)
Cholesterol, Total: 223 mg/dL — ABNORMAL HIGH (ref 100–199)
HDL: 85 mg/dL (ref >39)
LDL Chol Calc (NIH): 120 mg/dL — ABNORMAL HIGH (ref 0–99)
Triglycerides: 105 mg/dL (ref 0–149)
VLDL Cholesterol Cal: 18 mg/dL (ref 5–40)

## 2023-10-03 LAB — VITAMIN D 25 HYDROXY (VIT D DEFICIENCY, FRACTURES): Vit D, 25-Hydroxy: 27.5 ng/mL — ABNORMAL LOW (ref 30.0–100.0)

## 2023-10-03 LAB — BMP8+EGFR
BUN/Creatinine Ratio: 10 (ref 10–24)
BUN: 12 mg/dL (ref 8–27)
CO2: 22 mmol/L (ref 20–29)
Calcium: 9.5 mg/dL (ref 8.6–10.2)
Chloride: 103 mmol/L (ref 96–106)
Creatinine, Ser: 1.18 mg/dL (ref 0.76–1.27)
Glucose: 95 mg/dL (ref 70–99)
Potassium: 4 mmol/L (ref 3.5–5.2)
Sodium: 142 mmol/L (ref 134–144)
eGFR: 68 mL/min/{1.73_m2} (ref >59)

## 2023-10-03 LAB — IRON,TIBC AND FERRITIN PANEL
Ferritin: 201 ng/mL (ref 30–400)
Iron Saturation: 92 % (ref 15–55)
Iron: 226 ug/dL — ABNORMAL HIGH (ref 38–169)
Total Iron Binding Capacity: 246 ug/dL — ABNORMAL LOW (ref 250–450)
UIBC: 20 ug/dL — ABNORMAL LOW (ref 111–343)

## 2023-10-03 LAB — B12 AND FOLATE PANEL
Folate: 7.8 ng/mL (ref >3.0)
Vitamin B-12: 781 pg/mL (ref 232–1245)

## 2023-10-05 ENCOUNTER — Telehealth: Payer: Self-pay | Admitting: Internal Medicine

## 2023-10-05 NOTE — Telephone Encounter (Signed)
 Pt called in asking to speak with coumadin nurse. He has some questions about his most recent lab work with PCP and how it effects his coumadin.

## 2023-10-05 NOTE — Telephone Encounter (Signed)
 Called pt.  He had some questions about his recent lab work and if warfarin could affect the lab values.  Answered questions to the best of my knowledge but told him he would need to discuss this with his PCP that ordered the lab work.  He verbalized understanding.

## 2023-10-06 ENCOUNTER — Ambulatory Visit: Payer: PPO | Attending: Internal Medicine | Admitting: *Deleted

## 2023-10-06 DIAGNOSIS — I2609 Other pulmonary embolism with acute cor pulmonale: Secondary | ICD-10-CM | POA: Diagnosis not present

## 2023-10-06 DIAGNOSIS — Z5181 Encounter for therapeutic drug level monitoring: Secondary | ICD-10-CM | POA: Diagnosis not present

## 2023-10-06 DIAGNOSIS — I82433 Acute embolism and thrombosis of popliteal vein, bilateral: Secondary | ICD-10-CM

## 2023-10-06 DIAGNOSIS — Z7901 Long term (current) use of anticoagulants: Secondary | ICD-10-CM | POA: Diagnosis not present

## 2023-10-06 LAB — POCT INR: INR: 2.4 (ref 2.0–3.0)

## 2023-10-06 NOTE — Patient Instructions (Signed)
Continue warfarin 2 tablets daily except 1 tablet on Sundays Recheck in 6 weeks.  

## 2023-10-08 ENCOUNTER — Encounter: Payer: Self-pay | Admitting: Family Medicine

## 2023-10-08 ENCOUNTER — Ambulatory Visit: Payer: PPO | Admitting: Family Medicine

## 2023-10-08 VITALS — BP 106/61 | HR 78 | Resp 16 | Ht 73.0 in | Wt 185.0 lb

## 2023-10-08 DIAGNOSIS — I10 Essential (primary) hypertension: Secondary | ICD-10-CM

## 2023-10-08 DIAGNOSIS — R790 Abnormal level of blood mineral: Secondary | ICD-10-CM | POA: Diagnosis not present

## 2023-10-08 DIAGNOSIS — E782 Mixed hyperlipidemia: Secondary | ICD-10-CM

## 2023-10-08 DIAGNOSIS — R7303 Prediabetes: Secondary | ICD-10-CM | POA: Diagnosis not present

## 2023-10-08 MED ORDER — OMEGA 3 1000 MG PO CAPS
2.0000 | ORAL_CAPSULE | Freq: Every day | ORAL | 3 refills | Status: DC
Start: 2023-10-08 — End: 2023-10-27

## 2023-10-08 MED ORDER — ROSUVASTATIN CALCIUM 20 MG PO TABS
20.0000 mg | ORAL_TABLET | Freq: Every day | ORAL | 2 refills | Status: DC
Start: 2023-10-08 — End: 2023-12-29

## 2023-10-08 NOTE — Progress Notes (Signed)
 Established Patient Office Visit   Subjective  Patient ID: [name], male    DOB: 08/02/1956  Age: 68 y.o. MRN: [id number]  Chief Complaint  Patient presents with   Prediabetes    States when he had labs, his A1c wasn't checked and he is confused. He likes to keep up with it so he can change his eating based on the lab results. Also wants to go over the rest of his labs     He  has a past medical history of Bilateral shoulder bursitis, Chronic back pain, COPD (chronic obstructive pulmonary disease) with emphysema ([name]), DDD (degenerative disc disease), lumbar, Diverticulitis, DVT (deep venous thrombosis) ([name]) (11/19/2018), Dysphasia, Dyspnea, Dyspnea on exertion, Emphysema of lung ([name]) (2012), Fatty liver, GERD (gastroesophageal reflux disease), Gout, Heterozygous factor V Leiden mutation ([name]), Hiatal hernia, Hip problem (2020), History of acute pancreatitis, History of colon polyps, History of esophageal stricture, History of peptic ulcer (1990s), History of pneumococcal pneumonia (2006), History of pneumothorax, Hypertension, Malignant neoplasm of prostate [facility]) (urologist-  dr dahlstedt/  oncologist -- dr Kathrynn Running), Neuropathy, Numbness of left foot, OSA (obstructive sleep apnea), Pneumonia, Sleep apnea (2013), Solid nodule of lung greater than 8 mm in diameter (05/11/2018), and Wears glasses.  HPI Patient presents to the clinic for chronic follow up. For the details of today's visit, please refer to assessment and plan.   Review of Systems  Constitutional:  Negative for chills and fever.  Respiratory:  Negative for shortness of breath.   Cardiovascular:  Negative for chest pain.  Neurological:  Negative for dizziness and headaches.      Objective:     BP 106/61   Pulse 78   Resp 16   Ht 6\' 1"  (1.854 m)   Wt 185 lb (83.9 kg)   SpO2 94%   BMI 24.41 kg/m  BP Readings from Last 3 Encounters:  10/08/23 106/61  09/17/23 121/78  06/08/23 110/68      Physical  Exam Vitals reviewed.  Constitutional:      General: He is not in acute distress.    Appearance: Normal appearance. He is not ill-appearing, toxic-appearing or diaphoretic.  HENT:     Head: Normocephalic.  Eyes:     General:        Right eye: No discharge.        Left eye: No discharge.     Conjunctiva/sclera: Conjunctivae normal.  Cardiovascular:     Rate and Rhythm: Normal rate.     Pulses: Normal pulses.     Heart sounds: Normal heart sounds.  Pulmonary:     Effort: Pulmonary effort is normal. No respiratory distress.     Breath sounds: Normal breath sounds.  Abdominal:     General: Bowel sounds are normal.     Palpations: Abdomen is soft.     Tenderness: There is no abdominal tenderness. There is no right CVA tenderness, left CVA tenderness or guarding.  Musculoskeletal:        General: Normal range of motion.     Cervical back: Normal range of motion.  Skin:    General: Skin is warm and dry.     Capillary Refill: Capillary refill takes less than 2 seconds.  Neurological:     Mental Status: He is alert.     Coordination: Coordination normal.     Gait: Gait normal.  Psychiatric:        Mood and Affect: Mood normal.        Behavior: Behavior normal.  No results found for any visits on 10/08/23.  The 10-year ASCVD risk score (Arnett DK, et al., 2019) is: 8%    Assessment & Plan:  Prediabetes -     Hemoglobin A1c  Mixed hyperlipidemia Assessment & Plan: Recent LDL 120 Trial on Crestor 20 mg once daily Discussed  lifestyle modifications and follow diet low in saturated fat.  Diet to Lower Cholesterol Eat More: Oats, beans, and lentils: High in soluble fiber. Fatty fish: Salmon, tuna (rich in omega-3s). Nuts and seeds: Almonds, walnuts, flaxseeds. Fruits and vegetables: Apples, berries, leafy greens. Healthy fats: Olive oil, avocado. Limit: Saturated fats: Butter, cream, fatty meats. Trans fats: Fried foods, processed snacks. Sugar and refined carbs:  Sweets, white bread. Focus on whole foods, healthy fats, and fiber to improve heart health! Maintain an exercise routine 3 to 5 days a week for a minimum total of 150 minutes.   Orders: -     Omega 3; Take 2 capsules (2,000 mg total) by mouth daily.  Dispense: 60 capsule; Refill: 3 -     Lipid panel -     CMP14+EGFR -     CBC with Differential/Platelet  Abnormal serum iron level -     Iron, TIBC and Ferritin Panel  Primary hypertension Assessment & Plan: Vitals:   10/08/23 1001  BP: 106/61     Blood pressure controlled in today's visit Currently not on B/P pressure medication Continued discussion on DASH diet, low sodium diet and maintain a exercise routine for 150 minutes per week.    Other orders -     Rosuvastatin Calcium; Take 1 tablet (20 mg total) by mouth daily.  Dispense: 30 tablet; Refill: 2    Return in about 3 months (around 01/08/2024), or if symptoms worsen or fail to improve, for hyperlipidemia, pre-diabetes.   Cruzita Lederer Newman Nip, FNP

## 2023-10-08 NOTE — Patient Instructions (Addendum)
        Great to see you today.  I have refilled the medication(s) we provide.   Walk in for Lab work:  [facility] [address] Ervin Knack [place], [place] [address]  [phone] Monday- Friday: 8?AM-12:30?PM, 1:30-5?PM   Vitamin D levels low, I advise to taking  over the counter supplements of vitamin D 500 IU/ every other day  Consume Vitamin D-Rich Foods: Fatty Fish: Salmon, mackerel, tuna, and sardines. Egg Yolks: A good source if eaten whole. Fortified Foods: Milk, orange juice, cereals, and plant-based milks (like almond or soy milk). Mushrooms: Especially those exposed to sunlight or UV light. Cod Liver Oil: A concentrated source of vitamin D. Including these foods in your diet can help boost vitamin D levels   Symptoms of Low Vitamin D:  Fatigue and low energy. Bone pain and muscle weakness. Increased risk of fractures or weak bones. Frequent illness or infections. Mood changes, like depression or anxiety. Hair loss or thinning.     If labs were collected, we will inform you of lab results once received either by echart message or telephone call.   - echart message- for normal results that have been seen by the patient already.   - telephone call: abnormal results or if patient has not viewed results in their echart.   - Please take medications as prescribed. - Follow up with your primary health provider if any health concerns arises. - If symptoms worsen please contact your primary care provider and/or visit the emergency department.

## 2023-10-08 NOTE — Assessment & Plan Note (Signed)
 Recent LDL 120 Trial on Crestor 20 mg once daily Discussed  lifestyle modifications and follow diet low in saturated fat.  Diet to Lower Cholesterol Eat More: Oats, beans, and lentils: High in soluble fiber. Fatty fish: Salmon, tuna (rich in omega-3s). Nuts and seeds: Almonds, walnuts, flaxseeds. Fruits and vegetables: Apples, berries, leafy greens. Healthy fats: Olive oil, avocado. Limit: Saturated fats: Butter, cream, fatty meats. Trans fats: Fried foods, processed snacks. Sugar and refined carbs: Sweets, white bread. Focus on whole foods, healthy fats, and fiber to improve heart health! Maintain an exercise routine 3 to 5 days a week for a minimum total of 150 minutes.

## 2023-10-08 NOTE — Assessment & Plan Note (Signed)
 Vitals:   10/08/23 1001  BP: 106/61     Blood pressure controlled in today's visit Currently not on B/P pressure medication Continued discussion on DASH diet, low sodium diet and maintain a exercise routine for 150 minutes per week.

## 2023-10-09 ENCOUNTER — Encounter: Payer: Self-pay | Admitting: Family Medicine

## 2023-10-26 ENCOUNTER — Encounter: Payer: Self-pay | Admitting: Family Medicine

## 2023-10-27 ENCOUNTER — Telehealth: Payer: Self-pay | Admitting: Internal Medicine

## 2023-10-27 ENCOUNTER — Other Ambulatory Visit: Payer: Self-pay | Admitting: Family Medicine

## 2023-10-27 DIAGNOSIS — E782 Mixed hyperlipidemia: Secondary | ICD-10-CM

## 2023-10-27 MED ORDER — OMEGA 3 1000 MG PO CAPS: 2.0000 | ORAL_CAPSULE | Freq: Every day | ORAL | 3 refills | Status: DC

## 2023-10-27 MED ORDER — VITAMIN D3 25 MCG (1000 UT) PO CAPS: 1000.0000 [IU] | ORAL_CAPSULE | Freq: Every day | ORAL | 1 refills | Status: AC

## 2023-10-27 NOTE — Telephone Encounter (Signed)
 Pt c/o medication issue:  1. Name of Medication:   warfarin (COUMADIN) 2 MG tablet   2. How are you currently taking this medication (dosage and times per day)?   As prescribed  3. Are you having a reaction (difficulty breathing--STAT)?   4. What is your medication issue?   Patient wants a call back from [employer] regarding this medication and vitamin supplements.

## 2023-10-27 NOTE — Telephone Encounter (Signed)
 I encourage Vit D supplements  without k2

## 2023-10-27 NOTE — Telephone Encounter (Signed)
 sent

## 2023-10-27 NOTE — Telephone Encounter (Signed)
 Called pt back.  Informed him regular Vit D should not interfere with warfarin but vit D with K2 will decrease effects of warfarin.  He is going to hold off starting this supplement for now.

## 2023-10-28 ENCOUNTER — Telehealth: Payer: Self-pay | Admitting: Family Medicine

## 2023-10-28 NOTE — Telephone Encounter (Signed)
 Copied from CRM [id number]. Topic: Referral - Request for Referral >> Oct 28, 2023  3:12 PM Fredrica W wrote: Did the patient discuss referral with their provider in the last year? Yes and thought referral was already being submitted but her called and they have not seen him in 3 years and need a referral and dont show one on file.  (If No - schedule appointment) (If Yes - send message)  Appointment offered? No  Type of order/referral and detailed reason for visit: Neurology  Preference of office, provider, location: [facility] [address] Phone: [phone]  If referral order, have you been seen by this specialty before? Yes (If Yes, this issue or another issue? When? Where? Same location  Can we respond through MyChart? Yes

## 2023-10-29 NOTE — Telephone Encounter (Signed)
 What's the reason for referral it has been 3 years

## 2023-10-30 NOTE — Telephone Encounter (Signed)
 Copied from CRM [id number]. Topic: Clinical - Prescription Issue >> Oct 28, 2023  9:41 AM Victorino Dike T wrote: Reason for CRM: tiZANidine (ZANAFLEX) 2 MG tablet - insurance covers tablets not capsules, permission to change [phone]

## 2023-10-30 NOTE — Telephone Encounter (Signed)
 Spoke to pharmacy to approve the change.

## 2023-11-05 NOTE — Telephone Encounter (Signed)
 Contacted pt.to incur about referral. No answer. Lvm for call back.

## 2023-11-10 NOTE — Telephone Encounter (Signed)
 Pt. Stated that he had  spoken to you at last  visit  about sending a neurology referral for  pain in hands and feet.   Pt .states that Pain spans from toe to heel, pain is numbness and tingling, occasional burning and stabbing.   Pain in hands pt. Reports trouble gripping objects, when he reaches to grab objects he receives sharp pains in middle of hands and fingers.  Denies pain moving up arms to elbow.   He is not sure if it is nerve pain or not but the pain has been happening for several months. He is on board w/ a neurology referral or trying other treatment methods but the pain is causing issues with daily life.

## 2023-11-11 NOTE — Telephone Encounter (Signed)
 Hi patient will need an office visit with me or next available provider , we did not discuss this in our visit

## 2023-11-13 NOTE — Telephone Encounter (Signed)
APPT SCHEDULED, PATIENT AWARE

## 2023-11-17 ENCOUNTER — Ambulatory Visit: Attending: Internal Medicine | Admitting: *Deleted

## 2023-11-17 ENCOUNTER — Ambulatory Visit (INDEPENDENT_AMBULATORY_CARE_PROVIDER_SITE_OTHER)

## 2023-11-17 ENCOUNTER — Ambulatory Visit

## 2023-11-17 VITALS — BP 107/62 | HR 74 | Ht 73.0 in | Wt 185.0 lb

## 2023-11-17 DIAGNOSIS — I82433 Acute embolism and thrombosis of popliteal vein, bilateral: Secondary | ICD-10-CM

## 2023-11-17 DIAGNOSIS — I2609 Other pulmonary embolism with acute cor pulmonale: Secondary | ICD-10-CM

## 2023-11-17 DIAGNOSIS — Z5181 Encounter for therapeutic drug level monitoring: Secondary | ICD-10-CM

## 2023-11-17 DIAGNOSIS — Z7901 Long term (current) use of anticoagulants: Secondary | ICD-10-CM

## 2023-11-17 DIAGNOSIS — G629 Polyneuropathy, unspecified: Secondary | ICD-10-CM

## 2023-11-17 LAB — POCT INR: INR: 2.5 (ref 2.0–3.0)

## 2023-11-17 NOTE — Patient Instructions (Signed)
Continue warfarin 2 tablets daily except 1 tablet on Sundays Recheck in 6 weeks.  

## 2023-11-17 NOTE — Progress Notes (Unsigned)
   Established Patient Office Visit  Subjective   Patient ID: [name], male    DOB: November 21, 1955  Age: 68 y.o. MRN: [id number]  Chief Complaint  Patient presents with   Referral    Pt states "needs a referral for neurology for hands and feet"    HPI  {History (Optional):23778}  ROS    Objective:     BP 107/62   Pulse 74   Ht 6\' 1"  (1.854 m)   Wt 185 lb (83.9 kg)   SpO2 93%   BMI 24.41 kg/m  {Vitals History (Optional):23777}  Physical Exam   Results for orders placed or performed in visit on 11/17/23  POCT INR  Result Value Ref Range   INR 2.5 2.0 - 3.0   POC INR      {Labs (Optional):23779}  The 10-year ASCVD risk score (Arnett DK, et al., 2019) is: 8.2%    Assessment & Plan:   Problem List Items Addressed This Visit   None   No follow-ups on file.    Alison Irvine, FNP

## 2023-11-18 NOTE — Assessment & Plan Note (Signed)
 Recent labs performed on 10/02/23 were unremarkable. Will refer to neuro for further evaluation of neuopathy symptoms.

## 2023-12-16 ENCOUNTER — Encounter: Payer: Self-pay | Admitting: Family Medicine

## 2023-12-17 NOTE — Telephone Encounter (Signed)
 Which medication ?

## 2023-12-21 NOTE — Progress Notes (Signed)
 History of Present Illness:   He underwent TRUS/Bx on 5.1.2018. He had a 4 mm left apical nodule, a PSA of 5.99. Prostatic volume was 17 g. PSA density 0.35.  6/12 cores were positive for adenocarcinoma, all on the left side of the prostate.  3 cores revealed GG 1 pattern 3 cores revealed GG 2 pattern IPSS pre-procedure: 2/0   He underwent I-125 brachytherapy on 9.20.2018.   5.20.2025: First visit in a year.  He is having a heck of a time with lower urinary tract symptoms.  Frequency, urgency, at times urgency incontinence.  Feeling of incomplete emptying.  Nocturia x 2.  IPSS 27/4.  Not on any medical therapy.  Denies blood in his urine or stool.  PSA last year nadir level at 0.04.  Past Medical History:  Diagnosis Date   Bilateral shoulder bursitis    Chronic back pain    COPD (chronic obstructive pulmonary disease) with emphysema ([name])    DDD (degenerative disc disease), lumbar    hx epideral injection  L5--S1    Diverticulitis    DVT (deep venous thrombosis) ([name]) 11/19/2018   Dysphasia    intermittant w/ food    Dyspnea    Dyspnea on exertion    Emphysema of lung ([name]) 2012   Fatty liver    GERD (gastroesophageal reflux disease)    Gout    L great toe   Heterozygous factor V Leiden mutation ([name])    Hiatal hernia    Hip problem 2020   left, seeing orthopedics   History of acute pancreatitis    alcoholic pancreatitis 11-11-2011 and 01-30-2012. 2021   History of colon polyps    10-17-2005  hyperplastic polyp's   History of esophageal stricture    s/p  dilation 02-02-2017   History of peptic ulcer 1990s   History of pneumococcal pneumonia 2006   bilateral pneumonia w/ left lower lobe abscess treated w/ chest tube with suction for drainage   History of pneumothorax    1984--  left spontaneous pneumothorax ,  treated w/ chest tube   Hypertension    Malignant neoplasm of prostate [facility]) urologist-  dr Ruthe Roemer/  oncologist -- dr Lorri Rota   dx 12-02-2016-- Stage T2b,  Gleason 3+4,  PSA 5.99,  vol 25cc   Neuropathy    Numbness of left foot    outer left ankle numb-- per pt has appt. w/ neurologist   OSA (obstructive sleep apnea)    per pt has not used cpap over year ago from 04-16-2017--- per study 01-17-2010  severe osa   Pneumonia    Sleep apnea 2013   Solid nodule of lung greater than 8 mm in diameter 05/11/2018   03/2018: RUL Nodule, 10mm   Wears glasses     Past Surgical History:  Procedure Laterality Date   APPENDECTOMY  July 2023   BIOPSY  02/02/2017   Procedure: BIOPSY;  Surgeon: Suzette Espy, MD;  [facility];  Service: Endoscopy;;  duodenal bx's, esophgeal bx's   BIOPSY  07/30/2017   Procedure: BIOPSY;  Surgeon: Suzette Espy, MD;  [facility];  Service: Endoscopy;;  esophagus   BIOPSY  02/15/2021   Procedure: BIOPSY;  Surgeon: Suzette Espy, MD;  [facility];  Service: Endoscopy;;   CARDIOVASCULAR STRESS TEST  09/12/2009   normal nuclear perfusion study w/ no ischemia /  normal LV function and wall motion , ef 57%   COLON SURGERY  July 2023   Colectomy  COLONOSCOPY  2007   Dr. Riley Cheadle: hyperplastic polyps, internal hemorrhoids    COLONOSCOPY WITH PROPOFOL  N/A 05/03/2018   Dr. Riley Cheadle: diverticulosis in entire colon, multiple polyps in sigmoid, at splenic flexure, and ascending colon, not all polyps removed. Tubular adenomas and inflammatory polyps. Needs 3 year surveilance   COLONOSCOPY WITH PROPOFOL  N/A 02/15/2021   Many polyps without adenomatous changes but all inflammatory in nature with mildly active colitis and chronic inflammatory mucosa.  Repeat in 5 years.   CORONARY PRESSURE/FFR STUDY N/A 08/01/2021   Procedure: INTRAVASCULAR PRESSURE WIRE/FFR STUDY;  Surgeon: Odie Benne, MD;  Location: [facility];  Service: Cardiovascular;  Laterality: N/A;   EPIDURAL BLOCK INJECTION  2020   ESOPHAGOGASTRODUODENOSCOPY (EGD) WITH PROPOFOL  N/A 02/02/2017   Dr. Riley Cheadle: esophageal  stenosis s/p dilation and biopsy, medium sized hiatal hernia, normal duodenum   ESOPHAGOGASTRODUODENOSCOPY (EGD) WITH PROPOFOL  N/A 07/30/2017   Dr. Riley Cheadle: esophageal stenosis s/p dilation and biopsy, medium-sized hiatal hernia, normal duodenum   HEMORRHOID SURGERY  01-21-2008    [facility]   HIP ARTHROPLASTY Bilateral    IR RADIOLOGIST EVAL & MGMT  08/29/2020   JOINT REPLACEMENT  Hip joint 2020/2021   LEFT HEART CATH AND CORONARY ANGIOGRAPHY N/A 08/01/2021   Procedure: LEFT HEART CATH AND CORONARY ANGIOGRAPHY;  Surgeon: Odie Benne, MD;  Location: [facility];  Service: Cardiovascular;  Laterality: N/A;   MALONEY DILATION N/A 02/02/2017   Procedure: Londa Rival DILATION;  Surgeon: Suzette Espy, MD;  [facility];  Service: Endoscopy;  Laterality: N/A;   MALONEY DILATION N/A 07/30/2017   Procedure: Londa Rival DILATION;  Surgeon: Suzette Espy, MD;  [facility];  Service: Endoscopy;  Laterality: N/A;   POLYPECTOMY  05/03/2018   Procedure: POLYPECTOMY;  Surgeon: Suzette Espy, MD;  [facility];  Service: Endoscopy;;  ascending colon polyp, sigmoid colon polyps (multiple)   POLYPECTOMY  02/15/2021   Procedure: POLYPECTOMY;  Surgeon: Suzette Espy, MD;  [facility];  Service: Endoscopy;;   RADIOACTIVE SEED IMPLANT N/A 04/23/2017   Procedure: RADIOACTIVE SEED IMPLANT/BRACHYTHERAPY IMPLANT;  Surgeon: Trent Frizzle, MD;  Location[facility];  Service: Urology;  Laterality: N/A;   SHOULDER ARTHROSCOPY  08/05/2003   SPACE OAR INSTILLATION N/A 04/23/2017   Procedure: SPACE OAR INSTILLATION;  Surgeon: Trent Frizzle, MD;  Location: [facility];  Service: Urology;  Laterality: N/A;   TRANSTHORACIC ECHOCARDIOGRAM  03/28/2009   mild LVH, ef 55-60%/ mild aorta calcification   VASECTOMY  08/05/1983   VIDEO ASSISTED THORACOSCOPY (VATS)/DECORTICATION Left     Home Medications:  Allergies as of  12/22/2023   No Known Allergies      Medication List        Accurate as of Dec 21, 2023  1:34 PM. If you have any questions, ask your nurse or doctor.          acetaminophen  500 MG tablet Commonly known as: TYLENOL  Take 2 tablets (1,000 mg total) by mouth every 6 (six) hours as needed for mild pain.   albuterol  108 (90 Base) MCG/ACT inhaler Commonly known as: VENTOLIN  HFA Inhale 2 puffs into the lungs every 6 (six) hours as needed for wheezing or shortness of breath.   HYDROcodone -acetaminophen  5-325 MG tablet Commonly known as: NORCO/VICODIN Take 1 tablet by mouth every 6 (six) hours as needed for moderate pain (pain score 4-6).   ipratropium-albuterol  0.5-2.5 (3) MG/3ML Soln Commonly known as: DUONEB Take 3 mLs  by nebulization 4 (four) times daily.   nitroGLYCERIN  0.4 MG SL tablet Commonly known as: Nitrostat  Place 1 tablet (0.4 mg total) under the tongue every 5 (five) minutes as needed for chest pain.   NON FORMULARY Pt uses a cpap nightly   Omega 3 1000 MG Caps Take 2 capsules (2,000 mg total) by mouth daily.   psyllium 58.6 % powder Commonly known as: METAMUCIL Take 1 packet by mouth daily.   rosuvastatin  20 MG tablet Commonly known as: Crestor  Take 1 tablet (20 mg total) by mouth daily.   Trelegy Ellipta  100-62.5-25 MCG/ACT Aepb Generic drug: Fluticasone -Umeclidin-Vilant INHALE 1 PUFF ONCE DAILY   Vitamin D3 25 MCG (1000 UT) Caps Take 1 capsule (1,000 Units total) by mouth daily.   warfarin 2 MG tablet Commonly known as: COUMADIN  Take as directed by the anticoagulation clinic. If you are unsure how to take this medication, talk to your nurse or doctor. Original instructions: Take 2 tablet by mouth daily or as directed        Allergies: No Known Allergies  Family History  Problem Relation Age of Onset   Diabetes Mother    Heart disease Father    Heart disease Brother    Heart disease Brother    Cancer Neg Hx    Colon cancer Neg Hx     Colon polyps Neg Hx    Neuropathy Neg Hx     Social History:  reports that he quit smoking about 8 years ago. His smoking use included cigarettes and e-cigarettes. He started smoking about 38 years ago. He has a 30 pack-year smoking history. He has been exposed to tobacco smoke. He has never used smokeless tobacco. He reports that he does not currently use alcohol after a past usage of about 5.0 standard drinks of alcohol per week. He reports that he does not use drugs.  ROS: A complete review of systems was performed.  All systems are negative except for pertinent findings as noted.  Physical Exam:  Vital signs in last 24 hours: There were no vitals taken for this visit. Constitutional:  Alert and oriented, No acute distress Cardiovascular: Regular rate  Respiratory: Normal respiratory effort Neurologic: Grossly intact, no focal deficits Psychiatric: Normal mood and affect  I have reviewed prior pt notes  I have reviewed urinalysis results  I have independently reviewed prior imaging-prostate ultrasound  I have reviewed prior PSA/pathology results  Bladder scan reviewed--126 milliliters.  IPSS sheet reviewed   Impression/Assessment:  1.  BPH with symptoms, patient having worsening symptoms, he would like to consider medical  2.  Grade group 2 prostate cancer, status post brachytherapy about 6-year ago.  Excellent PSA response  Plan:  1.  I will put him on alfuzosin  2.  He will come back in a couple of months to recheck urinary symptoms as well as residual urine volume  3.  PSA today

## 2023-12-22 ENCOUNTER — Ambulatory Visit: Payer: PPO | Admitting: Urology

## 2023-12-22 VITALS — BP 103/68 | HR 66

## 2023-12-22 DIAGNOSIS — Z8546 Personal history of malignant neoplasm of prostate: Secondary | ICD-10-CM

## 2023-12-22 DIAGNOSIS — N138 Other obstructive and reflux uropathy: Secondary | ICD-10-CM

## 2023-12-22 DIAGNOSIS — R3915 Urgency of urination: Secondary | ICD-10-CM | POA: Diagnosis not present

## 2023-12-22 DIAGNOSIS — N401 Enlarged prostate with lower urinary tract symptoms: Secondary | ICD-10-CM

## 2023-12-22 DIAGNOSIS — N486 Induration penis plastica: Secondary | ICD-10-CM

## 2023-12-22 DIAGNOSIS — N521 Erectile dysfunction due to diseases classified elsewhere: Secondary | ICD-10-CM | POA: Diagnosis not present

## 2023-12-22 LAB — URINALYSIS, ROUTINE W REFLEX MICROSCOPIC
Bilirubin, UA: NEGATIVE
Glucose, UA: NEGATIVE
Ketones, UA: NEGATIVE
Leukocytes,UA: NEGATIVE
Nitrite, UA: NEGATIVE
Protein,UA: NEGATIVE
RBC, UA: NEGATIVE
Specific Gravity, UA: <1.005 — ABNORMAL LOW (ref 1.005–1.030)
Urobilinogen, Ur: 0.2 mg/dL (ref 0.2–1.0)
pH, UA: 6.5 (ref 5.0–7.5)

## 2023-12-22 LAB — BLADDER SCAN AMB NON-IMAGING: Scan Result: 126

## 2023-12-22 MED ORDER — ALFUZOSIN HCL ER 10 MG PO TB24: 10.0000 mg | ORAL_TABLET | Freq: Every day | ORAL | 11 refills | Status: DC

## 2023-12-22 NOTE — Progress Notes (Signed)
 Bladder Scan completed today.  Patient can void prior to the bladder scan. Bladder scan result: 126  Performed By: Alfonse Spruce. CMA  Additional notes-

## 2023-12-23 LAB — PSA: Prostate Specific Ag, Serum: <0.1 ng/mL (ref 0.0–4.0)

## 2023-12-27 ENCOUNTER — Other Ambulatory Visit: Payer: Self-pay | Admitting: Family Medicine

## 2023-12-29 ENCOUNTER — Ambulatory Visit: Attending: Internal Medicine | Admitting: *Deleted

## 2023-12-29 DIAGNOSIS — Z7901 Long term (current) use of anticoagulants: Secondary | ICD-10-CM

## 2023-12-29 DIAGNOSIS — Z5181 Encounter for therapeutic drug level monitoring: Secondary | ICD-10-CM

## 2023-12-29 DIAGNOSIS — I82433 Acute embolism and thrombosis of popliteal vein, bilateral: Secondary | ICD-10-CM | POA: Diagnosis not present

## 2023-12-29 DIAGNOSIS — I2609 Other pulmonary embolism with acute cor pulmonale: Secondary | ICD-10-CM | POA: Diagnosis not present

## 2023-12-29 LAB — POCT INR: INR: 2.4 (ref 2.0–3.0)

## 2023-12-29 NOTE — Patient Instructions (Signed)
Continue warfarin 2 tablets daily except 1 tablet on Sundays Recheck in 6 weeks.  

## 2024-01-08 ENCOUNTER — Ambulatory Visit: Admitting: Family Medicine

## 2024-01-13 ENCOUNTER — Encounter: Payer: Self-pay | Admitting: Family Medicine

## 2024-01-14 ENCOUNTER — Other Ambulatory Visit: Payer: Self-pay | Admitting: Family Medicine

## 2024-01-14 DIAGNOSIS — E291 Testicular hypofunction: Secondary | ICD-10-CM

## 2024-01-14 NOTE — Telephone Encounter (Signed)
 Patient may come in for labs ( walk in)  to have his testosterone levels evaluated due to ongoing symptoms of fatigue. He also has a known history of low vitamin D , so it is important to ensure he is consistently taking his vitamin D  supplementation daily

## 2024-01-18 ENCOUNTER — Encounter: Payer: Self-pay | Admitting: Family Medicine

## 2024-01-18 ENCOUNTER — Telehealth: Payer: Self-pay | Admitting: Neurology

## 2024-01-18 NOTE — Telephone Encounter (Signed)
 Spoke to patient informed him appointment is 04/2024 with Dr Tresia Fruit pt on wait list for sooner appointment. Informed pt if symptoms getting worse please see PCP , urgent care or visit ED to be evaluated sooner. Pt thanked me for calling

## 2024-01-18 NOTE — Telephone Encounter (Signed)
 Pt called inregards to wanting an ealier appt with MD . Pt states that his feet are getting numb to the point he is not able to walk . Pt fee like he will fall over everytime he stands. Pt states that he barely can feel  his feet . Pt is requesting to be seem sooner

## 2024-01-18 NOTE — Telephone Encounter (Signed)
 Pt called inregards to wanting an ealier appt with MD . Pt states that hus feet

## 2024-01-23 ENCOUNTER — Other Ambulatory Visit: Payer: Self-pay | Admitting: Adult Health

## 2024-01-23 DIAGNOSIS — J449 Chronic obstructive pulmonary disease, unspecified: Secondary | ICD-10-CM

## 2024-01-25 ENCOUNTER — Encounter: Payer: Self-pay | Admitting: *Deleted

## 2024-01-26 ENCOUNTER — Other Ambulatory Visit: Payer: Self-pay | Admitting: Internal Medicine

## 2024-01-26 ENCOUNTER — Other Ambulatory Visit: Payer: Self-pay | Admitting: Family Medicine

## 2024-01-26 ENCOUNTER — Telehealth: Payer: Self-pay | Admitting: Internal Medicine

## 2024-01-26 DIAGNOSIS — J449 Chronic obstructive pulmonary disease, unspecified: Secondary | ICD-10-CM

## 2024-01-26 DIAGNOSIS — G629 Polyneuropathy, unspecified: Secondary | ICD-10-CM

## 2024-01-26 MED ORDER — TRELEGY ELLIPTA 100-62.5-25 MCG/ACT IN AEPB: 1.0000 | INHALATION_SPRAY | Freq: Every day | RESPIRATORY_TRACT | 1 refills | Status: DC

## 2024-01-26 NOTE — Telephone Encounter (Signed)
 Copied from CRM [id number]. Topic: Appointments - Transfer of Care >> Jan 26, 2024 11:16 AM Celestine FALCON wrote: Pt is requesting to transfer FROM: Dr. Brenna Pt is requesting to transfer TO: Dr. Darlean Reason for requested transfer: Pt wanted to see a pulm near his residence which would be Dr. Darlean at [place]. It is the responsibility of the team the patient would like to transfer to (Dr. Darlean) to reach out to the patient if for any reason this transfer is not acceptable.

## 2024-01-26 NOTE — Telephone Encounter (Signed)
 Referral sent

## 2024-01-26 NOTE — Telephone Encounter (Signed)
 Copied from CRM [id number]. Topic: Clinical - Medication Refill >> Jan 26, 2024 11:19 AM Celestine FALCON wrote: Medication: Fluticasone -Umeclidin-Vilant (TRELEGY ELLIPTA ) 100-62.5-25 MCG/ACT AEPB   Has the patient contacted their pharmacy? Yes; pharmacy stated they needed a new prescription. Pt has a Transfer of Care appt scheduled in August with Dr. Darlean. Pt's previous provider Dr. Brenna is no longer at the practice. Pt wanted to relay that he NEEDS this medication as he tried alternatives and it did not work.  (Agent: If no, request that the patient contact the pharmacy for the refill. If patient does not wish to contact the pharmacy document the reason why and proceed with request.) (Agent: If yes, when and what did the pharmacy advise?)  This is the patient's preferred pharmacy:  [facility] [address], [place] - [address] [place] [address] Phone: [phone] Fax: [phone]  Is this the correct pharmacy for this prescription? Yes If no, delete pharmacy and type the correct one.   Has the prescription been filled recently? Yes; Feb of 2025.  Is the patient out of the medication? Yes  Has the patient been seen for an appointment in the last year OR does the patient have an upcoming appointment? Yes; appt is in August.  Can we respond through MyChart? Yes  Agent: Please be advised that Rx refills may take up to 3 business days. We ask that you follow-up with your pharmacy.

## 2024-01-29 ENCOUNTER — Encounter: Payer: Self-pay | Admitting: Neurology

## 2024-02-09 ENCOUNTER — Ambulatory Visit: Attending: Internal Medicine | Admitting: *Deleted

## 2024-02-09 DIAGNOSIS — Z5181 Encounter for therapeutic drug level monitoring: Secondary | ICD-10-CM

## 2024-02-09 DIAGNOSIS — I2609 Other pulmonary embolism with acute cor pulmonale: Secondary | ICD-10-CM

## 2024-02-09 DIAGNOSIS — Z7901 Long term (current) use of anticoagulants: Secondary | ICD-10-CM

## 2024-02-09 DIAGNOSIS — I82433 Acute embolism and thrombosis of popliteal vein, bilateral: Secondary | ICD-10-CM | POA: Diagnosis not present

## 2024-02-09 LAB — POCT INR: INR: 2.3 (ref 2.0–3.0)

## 2024-02-09 NOTE — Patient Instructions (Signed)
Continue warfarin 2 tablets daily except 1 tablet on Sundays Recheck in 6 weeks.  

## 2024-02-09 NOTE — Progress Notes (Signed)
Please see anticoagulation encounter.

## 2024-02-17 ENCOUNTER — Other Ambulatory Visit: Payer: Self-pay | Admitting: Internal Medicine

## 2024-02-17 NOTE — Telephone Encounter (Signed)
 Refill requet for Warfarin:  Last INR was 2.3 on 02/09/24 Next INR due 03/22/24 LOV scheduled for 03/03/24  Refill approved.

## 2024-02-22 ENCOUNTER — Ambulatory Visit (HOSPITAL_COMMUNITY)
Admission: RE | Admit: 2024-02-22 | Discharge: 2024-02-22 | Disposition: A | Discharge status: Home / Self Care | Source: Ambulatory Visit | Attending: Acute Care | Admitting: Acute Care

## 2024-02-22 DIAGNOSIS — Z87891 Personal history of nicotine dependence: Secondary | ICD-10-CM | POA: Diagnosis present

## 2024-02-22 DIAGNOSIS — R911 Solitary pulmonary nodule: Secondary | ICD-10-CM | POA: Insufficient documentation

## 2024-02-22 DIAGNOSIS — F17210 Nicotine dependence, cigarettes, uncomplicated: Secondary | ICD-10-CM | POA: Diagnosis present

## 2024-02-22 DIAGNOSIS — Z122 Encounter for screening for malignant neoplasm of respiratory organs: Secondary | ICD-10-CM | POA: Insufficient documentation

## 2024-02-22 NOTE — Progress Notes (Signed)
 Impression/Assessment:  1.  BPH with symptoms, patient having worsening symptoms, placed on alfuzosin  at last visit.  No significant improvement.  Cystoscopy today revealed no specific obstruction but a somewhat firm prostate secondary to his radiation.  Adequate urinary flow  2.  Grade group 2 prostate cancer, status post brachytherapy about 6-year ago.  Excellent PSA response  Plan:  1.  I will continue him on the alfuzosin  for now, add oxybutynin  every 12 hours as needed  2.  I will have him come back in about 1 month to recheck his symptoms.  I told him to stop the oxybutynin  if he has significant side effects or limited flow   History of Present Illness:   He underwent TRUS/Bx on 5.1.2018. He had a 4 mm left apical nodule, a PSA of 5.99. Prostatic volume was 17 g. PSA density 0.35.  6/12 cores were positive for adenocarcinoma, all on the left side of the prostate.  3 cores revealed GG 1 pattern 3 cores revealed GG 2 pattern IPSS pre-procedure: 2/0   He underwent I-125 brachytherapy on 9.20.2018.   5.20.2025: First visit in a year.  He is having a heck of a time with lower urinary tract symptoms.  Frequency, urgency, at times urgency incontinence.  Feeling of incomplete emptying.  Nocturia x 2.  IPSS 27/4.  Not on any medical therapy.  Denies blood in his urine or stool.  PSA last year nadir level at 0.04.  7.22.2025: Here today for recheck.  He was given alfuzosin  at his last visit.  Unfortunately, this really has not helped his urinary symptoms.  IPSS 27/4.  No significant side effects.  Past Medical History:  Diagnosis Date   Bilateral shoulder bursitis    Chronic back pain    COPD (chronic obstructive pulmonary disease) with emphysema ([name])    DDD (degenerative disc disease), lumbar    hx epideral injection  L5--S1    Diverticulitis    DVT (deep venous thrombosis) ([name]) 11/19/2018   Dysphasia    intermittant w/ food    Dyspnea    Dyspnea on exertion    Emphysema of lung  ([name]) 2012   Fatty liver    GERD (gastroesophageal reflux disease)    Gout    L great toe   Heterozygous factor V Leiden mutation ([name])    Hiatal hernia    Hip problem 2020   left, seeing orthopedics   History of acute pancreatitis    alcoholic pancreatitis 11-11-2011 and 01-30-2012. 2021   History of colon polyps    10-17-2005  hyperplastic polyp's   History of esophageal stricture    s/p  dilation 02-02-2017   History of peptic ulcer 1990s   History of pneumococcal pneumonia 2006   bilateral pneumonia w/ left lower lobe abscess treated w/ chest tube with suction for drainage   History of pneumothorax    1984--  left spontaneous pneumothorax ,  treated w/ chest tube   Hypertension    Malignant neoplasm of prostate [facility]) urologist-  dr Toneka Fullen/  oncologist -- dr patrcia   dx 12-02-2016-- Stage T2b, Gleason 3+4,  PSA 5.99,  vol 25cc   Neuropathy    Numbness of left foot    outer left ankle numb-- per pt has appt. w/ neurologist   OSA (obstructive sleep apnea)    per pt has not used cpap over year ago from 04-16-2017--- per study 01-17-2010  severe osa   Pneumonia    Sleep apnea 2013   Solid nodule  of lung greater than 8 mm in diameter 05/11/2018   03/2018: RUL Nodule, 10mm   Wears glasses     Past Surgical History:  Procedure Laterality Date   APPENDECTOMY  July 2023   BIOPSY  02/02/2017   Procedure: BIOPSY;  Surgeon: Shaaron Lamar HERO, MD;  [facility];  Service: Endoscopy;;  duodenal bx's, esophgeal bx's   BIOPSY  07/30/2017   Procedure: BIOPSY;  Surgeon: Shaaron Lamar HERO, MD;  [facility];  Service: Endoscopy;;  esophagus   BIOPSY  02/15/2021   Procedure: BIOPSY;  Surgeon: Shaaron Lamar HERO, MD;  [facility];  Service: Endoscopy;;   CARDIOVASCULAR STRESS TEST  09/12/2009   normal nuclear perfusion study w/ no ischemia /  normal LV function and wall motion , ef 57%   COLON SURGERY  July 2023   Colectomy   COLONOSCOPY  2007   Dr. Shaaron:  hyperplastic polyps, internal hemorrhoids    COLONOSCOPY WITH PROPOFOL  N/A 05/03/2018   Dr. Shaaron: diverticulosis in entire colon, multiple polyps in sigmoid, at splenic flexure, and ascending colon, not all polyps removed. Tubular adenomas and inflammatory polyps. Needs 3 year surveilance   COLONOSCOPY WITH PROPOFOL  N/A 02/15/2021   Many polyps without adenomatous changes but all inflammatory in nature with mildly active colitis and chronic inflammatory mucosa.  Repeat in 5 years.   CORONARY PRESSURE/FFR STUDY N/A 08/01/2021   Procedure: INTRAVASCULAR PRESSURE WIRE/FFR STUDY;  Surgeon: Verlin Lonni BIRCH, MD;  [facility];  Service: Cardiovascular;  Laterality: N/A;   EPIDURAL BLOCK INJECTION  2020   ESOPHAGOGASTRODUODENOSCOPY (EGD) WITH PROPOFOL  N/A 02/02/2017   Dr. Shaaron: esophageal stenosis s/p dilation and biopsy, medium sized hiatal hernia, normal duodenum   ESOPHAGOGASTRODUODENOSCOPY (EGD) WITH PROPOFOL  N/A 07/30/2017   Dr. Shaaron: esophageal stenosis s/p dilation and biopsy, medium-sized hiatal hernia, normal duodenum   HEMORRHOID SURGERY  01-21-2008    [facility]   HIP ARTHROPLASTY Bilateral    IR RADIOLOGIST EVAL & MGMT  08/29/2020   JOINT REPLACEMENT  Hip joint 2020/2021   LEFT HEART CATH AND CORONARY ANGIOGRAPHY N/A 08/01/2021   Procedure: LEFT HEART CATH AND CORONARY ANGIOGRAPHY;  Surgeon: Verlin Lonni BIRCH, MD;  [facility]: [facility];  Service: Cardiovascular;  Laterality: N/A;   MALONEY DILATION N/A 02/02/2017   Procedure: AGAPITO DILATION;  Surgeon: Shaaron Lamar HERO, MD;  [facility];  Service: Endoscopy;  Laterality: N/A;   MALONEY DILATION N/A 07/30/2017   Procedure: AGAPITO DILATION;  Surgeon: Shaaron Lamar HERO, MD;  [facility];  Service: Endoscopy;  Laterality: N/A;   POLYPECTOMY  05/03/2018   Procedure: POLYPECTOMY;  Surgeon: Shaaron Lamar HERO, MD;  [facility];  Service: Endoscopy;;  ascending colon polyp,  sigmoid colon polyps (multiple)   POLYPECTOMY  02/15/2021   Procedure: POLYPECTOMY;  Surgeon: Shaaron Lamar HERO, MD;  [facility];  Service: Endoscopy;;   RADIOACTIVE SEED IMPLANT N/A 04/23/2017   Procedure: RADIOACTIVE SEED IMPLANT/BRACHYTHERAPY IMPLANT;  Surgeon: Matilda Senior, MD;  Location: [facility];  Service: Urology;  Laterality: N/A;   SHOULDER ARTHROSCOPY  08/05/2003   SPACE OAR INSTILLATION N/A 04/23/2017   Procedure: SPACE OAR INSTILLATION;  Surgeon: Matilda Senior, MD;  Location: [facility];  Service: Urology;  Laterality: N/A;   TRANSTHORACIC ECHOCARDIOGRAM  03/28/2009   mild LVH, ef 55-60%/ mild aorta calcification   VASECTOMY  08/05/1983   VIDEO ASSISTED THORACOSCOPY (VATS)/DECORTICATION Left  Home Medications:  Allergies as of 02/23/2024   No Known Allergies      Medication List        Accurate as of February 22, 2024 12:52 PM. If you have any questions, ask your nurse or doctor.          acetaminophen  500 MG tablet Commonly known as: TYLENOL  Take 2 tablets (1,000 mg total) by mouth every 6 (six) hours as needed for mild pain.   albuterol  108 (90 Base) MCG/ACT inhaler Commonly known as: VENTOLIN  HFA Inhale 2 puffs into the lungs every 6 (six) hours as needed for wheezing or shortness of breath.   alfuzosin  10 MG 24 hr tablet Commonly known as: UROXATRAL  Take 1 tablet (10 mg total) by mouth daily with breakfast.   HYDROcodone -acetaminophen  5-325 MG tablet Commonly known as: NORCO/VICODIN Take 1 tablet by mouth every 6 (six) hours as needed for moderate pain (pain score 4-6).   ipratropium-albuterol  0.5-2.5 (3) MG/3ML Soln Commonly known as: DUONEB Take 3 mLs by nebulization 4 (four) times daily.   nitroGLYCERIN  0.4 MG SL tablet Commonly known as: Nitrostat  Place 1 tablet (0.4 mg total) under the tongue every 5 (five) minutes as needed for chest pain.   NON FORMULARY Pt uses a cpap nightly   Omega  3 1000 MG Caps Take 2 capsules (2,000 mg total) by mouth daily.   psyllium 58.6 % powder Commonly known as: METAMUCIL Take 1 packet by mouth daily.   rosuvastatin  20 MG tablet Commonly known as: CRESTOR  Take 1 tablet by mouth once daily   Trelegy Ellipta  100-62.5-25 MCG/ACT Aepb Generic drug: Fluticasone -Umeclidin-Vilant Inhale 1 puff into the lungs daily.   Vitamin D3 25 MCG (1000 UT) Caps Take 1 capsule (1,000 Units total) by mouth daily.   warfarin 2 MG tablet Commonly known as: COUMADIN  Take as directed by the anticoagulation clinic. If you are unsure how to take this medication, talk to your nurse or doctor. Original instructions: TAKE 2 TABLETS BY MOUTH ONCE DAILY OR AS DIRECTED        Allergies: No Known Allergies  Family History  Problem Relation Age of Onset   Diabetes Mother    Heart disease Father    Heart disease Brother    Heart disease Brother    Cancer Neg Hx    Colon cancer Neg Hx    Colon polyps Neg Hx    Neuropathy Neg Hx     Social History:  reports that he quit smoking about 8 years ago. His smoking use included cigarettes and e-cigarettes. He started smoking about 38 years ago. He has a 30 pack-year smoking history. He has been exposed to tobacco smoke. He has never used smokeless tobacco. He reports that he does not currently use alcohol after a past usage of about 5.0 standard drinks of alcohol per week. He reports that he does not use drugs.  ROS: A complete review of systems was performed.  All systems are negative except for pertinent findings as noted.  Physical Exam:  Vital signs in last 24 hours: There were no vitals taken for this visit. Constitutional:  Alert and oriented, No acute distress Cardiovascular: Regular rate  Respiratory: Normal respiratory effort Neurologic: Grossly intact, no focal deficits Psychiatric: Normal mood and affect  I have reviewed prior pt notes  I have reviewed urinalysis results--clear  I have  independently reviewed prior imaging-prostate ultrasound  I have reviewed prior PSA/pathology results  Bladder scan reviewed--34 mL  IPSS sheet reviewed  Cystoscopy Procedure  Note:  Indication: Lower urinary tract symptoms  After informed consent and discussion of the procedure and its risks, [name] was positioned and prepped in the standard fashion.  Cystoscopy was performed with a flexible cystoscope.   Findings: Urethra: No lesion or stricture Prostate: Relatively short prostatic urethra.  No lesions.  Although no obstruction, somewhat hard to pass the scope through. Bladder neck: Open Ureteral orifices: Normal bilaterally Bladder: No urothelial lesions, no stones.  No intravesical protrusion of prostate.  The patient tolerated the procedure well.  Flow rate was performed. Voided volume-314 mL Maximum flow-15 mL a second Average flow-7 mL a second Flow time-41 seconds

## 2024-02-23 ENCOUNTER — Ambulatory Visit: Admitting: Urology

## 2024-02-23 ENCOUNTER — Encounter: Payer: Self-pay | Admitting: Urology

## 2024-02-23 VITALS — BP 113/76 | HR 80

## 2024-02-23 DIAGNOSIS — R3915 Urgency of urination: Secondary | ICD-10-CM | POA: Diagnosis not present

## 2024-02-23 DIAGNOSIS — N521 Erectile dysfunction due to diseases classified elsewhere: Secondary | ICD-10-CM

## 2024-02-23 DIAGNOSIS — Z8546 Personal history of malignant neoplasm of prostate: Secondary | ICD-10-CM

## 2024-02-23 DIAGNOSIS — N401 Enlarged prostate with lower urinary tract symptoms: Secondary | ICD-10-CM

## 2024-02-23 DIAGNOSIS — N138 Other obstructive and reflux uropathy: Secondary | ICD-10-CM

## 2024-02-23 DIAGNOSIS — N486 Induration penis plastica: Secondary | ICD-10-CM

## 2024-02-23 LAB — URINALYSIS, ROUTINE W REFLEX MICROSCOPIC
Bilirubin, UA: NEGATIVE
Glucose, UA: NEGATIVE
Ketones, UA: NEGATIVE
Leukocytes,UA: NEGATIVE
Nitrite, UA: NEGATIVE
Protein,UA: NEGATIVE
RBC, UA: NEGATIVE
Specific Gravity, UA: <1.005 — ABNORMAL LOW (ref 1.005–1.030)
Urobilinogen, Ur: 0.2 mg/dL (ref 0.2–1.0)
pH, UA: 6 (ref 5.0–7.5)

## 2024-02-23 LAB — BLADDER SCAN AMB NON-IMAGING: Scan Result: 34

## 2024-02-23 MED ORDER — CIPROFLOXACIN HCL 500 MG PO TABS
500.0000 mg | ORAL_TABLET | Freq: Once | ORAL | Status: AC
  Administered 2024-02-23: 500 mg via ORAL

## 2024-02-23 MED ORDER — OXYBUTYNIN CHLORIDE 5 MG PO TABS
ORAL_TABLET | ORAL | 11 refills | Status: DC
Start: 2024-02-23 — End: 2024-03-21

## 2024-02-23 NOTE — Progress Notes (Addendum)
 Bladder Scan completed today.  Patient can void prior to the bladder scan. Bladder scan result: 34  Performed By: [facility] LPN  Uroflowmetry order received. Patient voided own on with the following results:   Uroflow  Peak Flow: 15ml Average Flow: 7ml Voided Volume: Voiding Time: 46sec Flow Time: 41sec Time to Peak Flow: 4sec

## 2024-02-23 NOTE — Addendum Note (Signed)
 Addended by: MATILDA SENIOR on: 02/23/2024 11:56 AM   Modules accepted: Orders

## 2024-02-24 ENCOUNTER — Encounter: Payer: Self-pay | Admitting: Urology

## 2024-02-28 NOTE — Progress Notes (Unsigned)
 Cardiology Office Note    Date:  03/03/2024  ID:  [name], DOB 1955/12/04, MRN [id number] Cardiologist: Diannah SHAUNNA Maywood, MD Cardiology APP:  Johnson Laymon HERO, PA-C { :  History of Present Illness:    [name] is a 68 y.o. 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  He was last examined by the cardiology service during an admission in 07/2022 during which he was there for chest pain. Pain was overall felt to be somewhat atypical as he reported more tenderness in the epigastric area. Repeat echocardiogram showed a preserved EF of 55 to 60% with no regional wall motion abnormalities. He did have mild dilatation of the ascending aorta at 40 mm (normal by CTA in 10/2022). Underwent an NST as well which was low-risk. He has not been evaluated by cardiology since. Does follow with the Coumadin  clinic and most recent INR was on 02/09/2024 and at 2.3.  In talking with the patient today, he reports things have overall been stable since his last office visit. He continues to suffer from neuropathy which is mostly along his feet but tries to remain active. Says that he averages approximately 7 miles per day. He does report intermittent fatigue and has dyspnea on exertion in the setting of COPD but reports symptoms have overall been well-controlled since being restarted on Trelegy. No recent exertional chest pain or palpitations. He remains on Coumadin  for anticoagulation with no reports of active bleeding.  He was informed by his PCP that his platelet count was low by most recent labs and this was at 119 K when checked on 02/26/2024. Overall similar to prior values dating  back for the past few years (varying from 95 K - 168 K). He did stop taking cholesterol medication in the interim given significant dietary changes as he mostly consumes fruits and vegetables and focuses on intake of protein and fiber. Has lost 60 pounds. His Hgb A1c has also significantly improved with these changes.  Studies Reviewed:   EKG: EKG is ordered today and demonstrates:   EKG Interpretation Date/Time:  Thursday March 03 2024 10:30:39 EDT Ventricular Rate:  66 PR Interval:  222 QRS Duration:  92 QT Interval:  374 QTC Calculation: 392 R Axis:   60  Text Interpretation: Sinus rhythm with 1st degree A-V block When compared with ECG of 26-Oct-2022 09:31, PREVIOUS ECG IS PRESENT No acute ST changes. Confirmed by Johnson Laymon ([id number]) on 03/03/2024 10:35:11 AM       Echocardiogram: 07/2022 IMPRESSIONS     1. Left ventricular ejection fraction, by estimation, is 55 to 60%. The  left ventricle has normal function. The left ventricle has no regional  wall motion abnormalities. There is mild left ventricular hypertrophy.  Left ventricular diastolic parameters  are consistent with Grade I diastolic dysfunction (impaired relaxation).   2. Right ventricular systolic function is normal. The right ventricular  size is normal.   3. The mitral valve is normal in structure. No evidence of mitral valve  regurgitation. No evidence of mitral stenosis.   4. The aortic valve is tricuspid. Aortic valve regurgitation is not  visualized. No aortic stenosis is present.   5. Aortic dilatation noted. There is  mild dilatation of the aortic root,  measuring 40 mm. There is mild dilatation of the ascending aorta,  measuring 40 mm.   6. The inferior vena cava is normal in size with greater than 50%  respiratory variability, suggesting right atrial pressure of 3 mmHg.   NST: 07/2022   The study is normal. There are no perfusion defects. The study is low risk.   No ST deviation was noted.    LV perfusion is normal.   Left ventricular function is normal. End diastolic cavity size is normal.   Physical Exam:   VS:  BP 126/70 (BP Location: Left Arm, Patient Position: Sitting, Cuff Size: Normal)   Pulse 68   Ht 6' 1 (1.854 m)   Wt 182 lb 12.8 oz (82.9 kg)   SpO2 97%   BMI 24.12 kg/m    Wt Readings from Last 3 Encounters:  03/03/24 182 lb 12.8 oz (82.9 kg)  11/17/23 185 lb (83.9 kg)  10/08/23 185 lb (83.9 kg)     GEN: Well nourished, well developed male appearing in no acute distress NECK: No JVD; No carotid bruits CARDIAC: RRR, no murmurs, rubs, gallops RESPIRATORY:  Clear to auscultation without rales, wheezing or rhonchi  ABDOMEN: Appears non-distended. No obvious abdominal masses. EXTREMITIES: No clubbing or cyanosis. No pitting edema.  Distal pedal pulses are 2+ bilaterally.   Assessment and Plan:   1. Coronary artery disease involving native coronary artery of native heart without angina pectoris - Cardiac catheterization in 2022 showed moderate proximal-LAD stenosis but this was not significant by FFR. NST in 07/2022 was low-risk. - He has baseline dyspnea on exertion in the setting of COPD but no recent exertional chest pain. Remains active at baseline as discussed above.  - He is not on ASA given the need for anticoagulation. Remains on Omega-3 as he previously discontinued statin therapy. Will recheck an FLP as outlined below.  2. Factor V Leiden mutation ([name]) - He remains on Coumadin  for anticoagulation and INR was at 2.3 when checked on 02/09/2024.  He does have chronic thrombocytopenia which has overall been stable for the past few years by review of prior labs. We reviewed that if his platelet count drops less than 100K, would recommend referral back to Hematology.  3. History of HTN - BP is well-controlled at 126/70 during today's visit and he is no longer on antihypertensive therapy.  4. Hyperlipidemia LDL goal <70 - FLP in 09/2023 showed total  cholesterol 223 and LDL 120. Previously had myalgias with Crestor  20 mg daily. - He does report making significant dietary changes in the interim and we will recheck an FLP.  If LDL remains above goal, would try low-dose Crestor  5 mg once weekly initially. He prefers to focus on dietary changes and we reviewed that while this does help, there can be a genetic component to cholesterol as well.  Signed, Laymon CHRISTELLA Qua, PA-C

## 2024-02-29 ENCOUNTER — Telehealth: Payer: Self-pay | Admitting: *Deleted

## 2024-02-29 ENCOUNTER — Telehealth: Payer: Self-pay | Admitting: Acute Care

## 2024-02-29 ENCOUNTER — Other Ambulatory Visit: Payer: Self-pay | Admitting: Acute Care

## 2024-02-29 DIAGNOSIS — R918 Other nonspecific abnormal finding of lung field: Secondary | ICD-10-CM

## 2024-02-29 NOTE — Telephone Encounter (Signed)
 Call report from [facility] Radiology:   IMPRESSION: 1. Interval increase in size of a spiculated left upper lobe pulmonary nodule with a mean diameter of 10.3 mm. This is highly suspicious for a primary pulmonary malignancy. Lung-RADS 4B, suspicious. Chest CT with or without contrast, PET/CT and/or tissue sampling depending on the probability of malignancy and comorbidities. PET/CT may be used when there is a = 8 mm (= 268 mm) solid component. These results will be called to the ordering clinician or representative by the Radiologist Assistant, and communication documented in the PACS or [employer]. 2. Severe emphysema. 3. Extensive multi-vessel coronary artery calcification. 4. Part solid nodule within the right upper lobe is stable since remote prior examination of 08/16/2021 and is benign.   Aortic Atherosclerosis (ICD10-I70.0) and Emphysema (ICD10-J43.9).

## 2024-02-29 NOTE — Telephone Encounter (Signed)
 I have called the patient with the results of his scan. This was a 6 month follow up after a LR 3 reading. The repeat scan is read as a LR 4 B. The nodule of concern has increased in size in the interval to 10.3 mm . We discussed that we recommend a PET scan to further evaluate this nodule, with follow up in the [place] office to review results.  Pt. Is in agreement with this plan. PET scan has been ordered. Nurse navigators, please schedule patient for a follow up OV with me 1 week after PET to review results . Thanks so much

## 2024-03-01 NOTE — Telephone Encounter (Signed)
Results/ plans faxed to PCP. Will await PET results.  

## 2024-03-01 NOTE — Telephone Encounter (Signed)
 See telephone note 02/29/24.

## 2024-03-03 ENCOUNTER — Ambulatory Visit: Attending: Student | Admitting: Student

## 2024-03-03 ENCOUNTER — Encounter: Payer: Self-pay | Admitting: Student

## 2024-03-03 VITALS — BP 126/70 | HR 68 | Ht 73.0 in | Wt 182.8 lb

## 2024-03-03 DIAGNOSIS — Z8679 Personal history of other diseases of the circulatory system: Secondary | ICD-10-CM

## 2024-03-03 DIAGNOSIS — I2510 Atherosclerotic heart disease of native coronary artery without angina pectoris: Secondary | ICD-10-CM | POA: Diagnosis not present

## 2024-03-03 DIAGNOSIS — D6851 Activated protein C resistance: Secondary | ICD-10-CM | POA: Diagnosis not present

## 2024-03-03 DIAGNOSIS — E785 Hyperlipidemia, unspecified: Secondary | ICD-10-CM

## 2024-03-03 DIAGNOSIS — I10 Essential (primary) hypertension: Secondary | ICD-10-CM

## 2024-03-03 MED ORDER — OMEGA-3 FISH OIL 500 MG PO CAPS: 500.0000 mg | ORAL_CAPSULE | Freq: Every day | ORAL | Status: DC

## 2024-03-03 NOTE — Patient Instructions (Signed)
 Medication Instructions:  Your physician recommends that you continue on your current medications as directed. Please refer to the Current Medication list given to you today.  *If you need a refill on your cardiac medications before your next appointment, please call your pharmacy*  Lab Work: Your physician recommends that you return for lab work in: Fasting Lipid   If you have labs (blood work) drawn today and your tests are completely normal, you will receive your results only by: MyChart Message (if you have MyChart) OR A paper copy in the mail If you have any lab test that is abnormal or we need to change your treatment, we will call you to review the results.  Testing/Procedures: NONE   Follow-Up: At [facility], you and your health needs are our priority.  As part of our continuing mission to provide you with exceptional heart care, our providers are all part of one team.  This team includes your primary Cardiologist (physician) and Advanced Practice Providers or APPs (Physician Assistants and Nurse Practitioners) who all work together to provide you with the care you need, when you need it.  Your next appointment:   1 year(s)  Provider:   You may see Vishnu P Mallipeddi, MD or one of the following Advanced Practice Providers on your designated Care Team:   [place], PA-C  Sc[place]ia [place], [place] Olivia Pavy, [place]     We recommend signing up for the patient portal called MyChart.  Sign up information is provided on this After Visit Summary.  MyChart is used to connect with patients for Virtual Visits (Telemedicine).  Patients are able to view lab/test results, encounter notes, upcoming appointments, etc.  Non-urgent messages can be sent to your provider as well.   To learn more about what you can do with MyChart, go to [email or web address].   Other Instructions Thank you for choosing [facility]!

## 2024-03-10 ENCOUNTER — Encounter (HOSPITAL_COMMUNITY)
Admission: RE | Admit: 2024-03-10 | Discharge: 2024-03-10 | Disposition: A | Discharge status: Home / Self Care | Source: Ambulatory Visit | Attending: Acute Care | Admitting: Acute Care

## 2024-03-10 ENCOUNTER — Ambulatory Visit: Payer: Self-pay | Admitting: Student

## 2024-03-10 ENCOUNTER — Other Ambulatory Visit (HOSPITAL_COMMUNITY)
Admission: RE | Admit: 2024-03-10 | Discharge: 2024-03-10 | Disposition: A | Discharge status: Home / Self Care | Source: Ambulatory Visit | Attending: Student | Admitting: Student

## 2024-03-10 DIAGNOSIS — R918 Other nonspecific abnormal finding of lung field: Secondary | ICD-10-CM | POA: Insufficient documentation

## 2024-03-10 DIAGNOSIS — E785 Hyperlipidemia, unspecified: Secondary | ICD-10-CM | POA: Diagnosis present

## 2024-03-10 LAB — LIPID PANEL
Cholesterol: 177 mg/dL (ref 0–200)
HDL: 63 mg/dL (ref >40)
LDL Cholesterol: 99 mg/dL (ref 0–99)
Total CHOL/HDL Ratio: 2.8 ratio
Triglycerides: 76 mg/dL (ref <150)
VLDL: 15 mg/dL (ref 0–40)

## 2024-03-10 MED ORDER — FLUDEOXYGLUCOSE F - 18 (FDG) INJECTION
9.0100 | Freq: Once | INTRAVENOUS | Status: AC | PRN
  Administered 2024-03-10: 9.01 via INTRAVENOUS

## 2024-03-10 NOTE — Telephone Encounter (Signed)
-----   Message from [place] sent at 03/10/2024  9:12 AM EDT ----- Please let the patient know his cholesterol has improved but still remains above goal. His LDL was previously 120 and is now down to 99. We prefer for this to be less than 70 given his known plaque  buildup along his coronary arteries. He previously had muscle aches with Crestor  20mg  daily. If he is in agreement, would recommend trying Crestor  5mg  once weekly. If able to tolerate, could try  increasing to every other day. Would recheck an FLP and LFT's in 3 months if able to tolerate.  ----- Message ----- From: Interface, Lab In [place] Sent: 03/10/2024   8:57 AM EDT To: Laymon CHRISTELLA Qua, PA-C

## 2024-03-10 NOTE — Telephone Encounter (Signed)
 The patient has been notified of the result and verbalized understanding.  All questions (if any) were answered. DOMINIC COLUMBUS, LPN 08/05/7972 89:56 AM   Pt declined to start Crestor  5 mg weekly at this time. He prefers to try diet and exercise.

## 2024-03-11 ENCOUNTER — Telehealth: Payer: Self-pay | Admitting: Neurology

## 2024-03-11 NOTE — Telephone Encounter (Signed)
 Pt made request to cx appointment, he said it is no longer needed

## 2024-03-20 NOTE — Progress Notes (Unsigned)
 [name], male    DOB: 31-Oct-1955    MRN: [id number]   Brief patient profile:  53  yowm electrician quit smoking 2017  Icard patient  self referred to pulmonary clinic in [facility]  03/23/2024   for GOLD 0 COPD  - also followed in [facility] program by Lauraine Lites NP   PFT in July 04, 2021  FEV1 at 62%, ratio 67, FVC 69%, postbronchodilator FEV1 66%, ratio 0.74, DLCO 67%.     History of Present Illness  03/23/2024  Pulmonary/ 1st office eval/ [place] / [place] [facility] re AB on Trelegy 100 q Am  Chief Complaint  Patient presents with   COPD    F/u   Dyspnea:  very active but slower than others - denies problem with mild  inclines does have some sob climbing latters Cough: occ am clear mucus  Sleep: bed is flat on side 1 pillow  SABA use: not lately  02: not on it > sats are  LDSCT:in program   No obvious day to day or daytime pattern/variability or assoc  purulent sputum or mucus plugs or hemoptysis or cp or chest tightness, subjective wheeze or overt sinus or hb symptoms.    Also denies any obvious fluctuation of symptoms with weather or environmental changes or other aggravating or alleviating factors except as outlined above   No unusual exposure hx or h/o childhood pna/ asthma or knowledge of premature birth.  Current Allergies, Complete Past Medical History, Past Surgical History, Family History, and Social History were reviewed in [employer] record.  ROS  The following are not active complaints unless bolded Hoarseness, sore throat, dysphagia, dental problems, itching, sneezing,  nasal congestion or discharge of excess mucus or purulent secretions, ear ache,   fever, chills, sweats, unintended wt loss or wt gain, classically pleuritic or exertional cp,  orthopnea pnd or arm/hand swelling  or leg swelling, presyncope, palpitations, abdominal pain, anorexia, nausea, vomiting, diarrhea  or change in bowel habits or change in bladder habits, change in  stools or change in urine, dysuria, hematuria,  rash, arthralgias, visual complaints, headache, numbness, weakness=fatigue  or ataxia or problems with walking or coordination,  change in mood or  memory.            Outpatient Medications Prior to Visit  Medication Sig Dispense Refill   acetaminophen  (TYLENOL ) 500 MG tablet Take 2 tablets (1,000 mg total) by mouth every 6 (six) hours as needed for mild pain. 30 tablet 0   albuterol  (VENTOLIN  HFA) 108 (90 Base) MCG/ACT inhaler Inhale 2 puffs into the lungs every 6 (six) hours as needed for wheezing or shortness of breath.     alfuzosin  (UROXATRAL ) 10 MG 24 hr tablet Take 10 mg by mouth every morning.     Alpha-Lipoic Acid 600 MG TABS Take 1 tablet one to two times a day     Cholecalciferol (VITAMIN D3) 25 MCG (1000 UT) CAPS Take 1 capsule (1,000 Units total) by mouth daily. 90 capsule 1   cycloSPORINE (RESTASIS) 0.05 % ophthalmic emulsion      ipratropium-albuterol  (DUONEB) 0.5-2.5 (3) MG/3ML SOLN Take 3 mLs by nebulization 4 (four) times daily.     minocycline (MINOCIN) 100 MG capsule Take 100 mg by mouth daily.     NON FORMULARY Pt uses a cpap nightly     Omega 3 1000 MG CAPS Take 2 capsules (2,000 mg total) by mouth daily. 60 capsule 3   psyllium (METAMUCIL) 58.6 % powder Take  1 packet by mouth daily.     warfarin (COUMADIN ) 2 MG tablet TAKE 2 TABLETS BY MOUTH ONCE DAILY OR AS DIRECTED 180 tablet 1   Fluticasone -Umeclidin-Vilant (TRELEGY ELLIPTA ) 100-62.5-25 MCG/ACT AEPB Inhale 1 puff into the lungs daily. 60 each 1   nitroGLYCERIN  (NITROSTAT ) 0.4 MG SL tablet Place 0.4 mg under the tongue every 5 (five) minutes x 3 doses as needed (if no relief after 2nd dose, proceed to ED or call 911). (Patient not taking: Reported on 03/23/2024)     No facility-administered medications prior to visit.    Past Medical History:  Diagnosis Date   Arthritis 2015   Bilateral shoulder bursitis    CAD (coronary artery disease)    a. (prior catheterization  in 07/2021 showing moderate proximal LAD stenosis but not significant by FFR b.low-risk NST in 07/2022   Chronic back pain    COPD (chronic obstructive pulmonary disease) with emphysema ([name])    DDD (degenerative disc disease), lumbar    hx epideral injection  L5--S1    Diverticulitis    DVT (deep venous thrombosis) ([name]) 11/19/2018   Dysphasia    intermittant w/ food    Dyspnea    Dyspnea on exertion    Emphysema of lung ([name]) 2012   Fatty liver    GERD (gastroesophageal reflux disease) 2015   Gout    L great toe   Heterozygous factor V Leiden mutation ([name])    Hiatal hernia    Hip problem 2020   left, seeing orthopedics   History of acute pancreatitis    alcoholic pancreatitis 11-11-2011 and 01-30-2012. 2021   History of colon polyps    10-17-2005  hyperplastic polyp's   History of esophageal stricture    s/p  dilation 02-02-2017   History of peptic ulcer 1990s   History of pneumococcal pneumonia 2006   bilateral pneumonia w/ left lower lobe abscess treated w/ chest tube with suction for drainage   History of pneumothorax    1984--  left spontaneous pneumothorax ,  treated w/ chest tube   Hypertension    Malignant neoplasm of prostate [facility]) urologist-  dr dahlstedt/  oncologist -- dr patrcia   dx 12-02-2016-- Stage T2b, Gleason 3+4,  PSA 5.99,  vol 25cc   Neuropathy    Numbness of left foot    outer left ankle numb-- per pt has appt. w/ neurologist   OSA (obstructive sleep apnea)    per pt has not used cpap over year ago from 04-16-2017--- per study 01-17-2010  severe osa   Pneumonia    Sleep apnea 2013   Solid nodule of lung greater than 8 mm in diameter 05/11/2018   03/2018: RUL Nodule, 10mm   Wears glasses       Objective:     BP 106/67   Pulse 61   Ht 6' 1 (1.854 m)   Wt 184 lb (83.5 kg)   SpO2 97% Comment: ra  BMI 24.28 kg/m   SpO2: 97 % (ra)amb wm french hat/ occ throat clearing   HEENT : Oropharynx  clear      Nasal turbinates nl    NECK :   without  apparent JVD/ palpable Nodes/TM    LUNGS: no acc muscle use,  Nl contour chest which is clear to A and P bilaterally without cough on insp or exp maneuvers   CV:  RRR  no s3 or murmur or increase in P2, and no edema   ABD:  soft and nontender  MS:  Gait nl   ext warm without deformities Or obvious joint restrictions  calf tenderness, cyanosis or clubbing    SKIN: warm and dry without lesions    NEURO:  alert, approp, nl sensorium with  no motor or cerebellar deficits apparent.          Assessment   Assessment & Plan GOLD 0/ AB Quit smoking 2017  - PFT in July 04, 2021  FEV1 at 62%, ratio 67, FVC 69%, postbronchodilator FEV1 66%, ratio 0.74, DLCO 67%.   - 03/23/2024  p Trelegy  Walked on RA  x  3  lap(s) =  approx 450   ft  @ fast pace, stopped due to end of study  with lowest 02 sats 95%   Although he doesn't actually meet the criteria for copd, he appears to be  Group D (now reclassified as E) in terms of symptom/risk and laba/lama/ICS  therefore appropriate rx at this point >>>  continue trelegy 100 for AB component (unless any anticholinergic side effects) and approp saba:  Re SABA :  I spent extra time with pt today reviewing appropriate use of albuterol  for prn use on exertion with the following points: 1) saba is for relief of sob that does not improve by walking a slower pace or resting but rather if the pt does not improve after trying this first. 2) If the pt is convinced, as many are, that saba helps recover from activity faster then it's easy to tell if this is the case by re-challenging : ie stop, take the inhaler, then p 5 minutes try the exact same activity (intensity of workload) that just caused the symptoms and see if they are substantially diminished or not after saba 3) if there is an activity that reproducibly causes the symptoms, try the saba 15 min before the activity on alternate days   If in fact the saba really does help, then fine to continue  to use it prn but advised may need to look closer at the maintenance regimen (Trelegy here, which may actually be over doing it)  being used to achieve better control of airways disease with exertion.   F/u can be q year - call sooner prn   Each maintenance medication was reviewed in detail including emphasizing most importantly the difference between maintenance and prns and under what circumstances the prns are to be triggered using an action plan format where appropriate.  Total time for H and P, chart review, counseling, reviewing dpi/ hfa/ neb device(s) , directly observing portions of ambulatory 02 saturation study/ and generating customized AVS unique to this office visit / same day charting = 42 min with pt new to me           Patient Instructions  Plan A = Automatic = Always=    Trelegy 100 one click first thing each am   Plan B = Backup (to supplement plan A, not to replace it) Use your albuterol  inhaler as a rescue medication to be used if you can't catch your breath by resting or slowing your pace  or doing a relaxed purse lip breathing pattern.  - The less you use it, the better it will work when you need it. - Ok to use the inhaler up to 2 puffs  every 4 hours if you must but call for appointment if use goes up over your usual need - Don't leave home without it !!  (think of it like the spare tire or  starter fluid for your car)   Plan C = Crisis (instead of Plan B but only if Plan B stops working) - only use your albuterol  nebulizer if you first try Plan B and it fails to help > ok to use the nebulizer up to every 4 hours but if start needing it regularly call for immediate appointment   Also  Ok to try albuterol  15 min before an activity (on alternating days)  that you know would usually make you short of breath and see if it makes any difference and if makes none then don't take albuterol  after activity unless you can't catch your breath as this means it's the resting that  helps, not the albuterol .   Please schedule a follow up visit in 12  months but call sooner if needed             Ozell America, MD 03/23/2024

## 2024-03-21 ENCOUNTER — Other Ambulatory Visit: Payer: Self-pay

## 2024-03-21 ENCOUNTER — Encounter: Payer: Self-pay | Admitting: Acute Care

## 2024-03-21 ENCOUNTER — Ambulatory Visit: Admitting: Acute Care

## 2024-03-21 VITALS — BP 98/60 | HR 57 | Temp 97.6°F | Ht 73.0 in | Wt 185.4 lb

## 2024-03-21 DIAGNOSIS — Z87891 Personal history of nicotine dependence: Secondary | ICD-10-CM | POA: Diagnosis not present

## 2024-03-21 DIAGNOSIS — G4733 Obstructive sleep apnea (adult) (pediatric): Secondary | ICD-10-CM

## 2024-03-21 DIAGNOSIS — R9389 Abnormal findings on diagnostic imaging of other specified body structures: Secondary | ICD-10-CM

## 2024-03-21 DIAGNOSIS — R911 Solitary pulmonary nodule: Secondary | ICD-10-CM

## 2024-03-21 DIAGNOSIS — Z122 Encounter for screening for malignant neoplasm of respiratory organs: Secondary | ICD-10-CM

## 2024-03-21 DIAGNOSIS — J449 Chronic obstructive pulmonary disease, unspecified: Secondary | ICD-10-CM

## 2024-03-21 NOTE — Progress Notes (Signed)
 History of Present Illness [name] is a 68 y.o. male former smoker ( 30 pack years quit 2017) followed by the lung cancer screening program.  He has had a slowly growing nodule that has become more spiculated and changed morphologically.  He is here today for follow-up of a PET scan.  Patient has a significant medical history as noted below.  Past Medical History:  Diagnosis Date   Bilateral shoulder bursitis    CAD (coronary artery disease)    a. (prior catheterization in 07/2021 showing moderate proximal LAD stenosis but not significant by FFR b.low-risk NST in 07/2022   Chronic back pain    COPD (chronic obstructive pulmonary disease) with emphysema ([name])    DDD (degenerative disc disease), lumbar    hx epideral injection  L5--S1    Diverticulitis    DVT (deep venous thrombosis) ([name]) 11/19/2018   Dysphasia    intermittant w/ food    Dyspnea    Dyspnea on exertion    Emphysema of lung ([name]) 2012   Fatty liver    GERD (gastroesophageal reflux disease)    Gout    L great toe   Heterozygous factor V Leiden mutation ([name])    Hiatal hernia    Hip problem 2020   left, seeing orthopedics   History of acute pancreatitis    alcoholic pancreatitis 11-11-2011 and 01-30-2012. 2021   History of colon polyps    10-17-2005  hyperplastic polyp's   History of esophageal stricture    s/p  dilation 02-02-2017   History of peptic ulcer 1990s   History of pneumococcal pneumonia 2006   bilateral pneumonia w/ left lower lobe abscess treated w/ chest tube with suction for drainage   History of pneumothorax    1984--  left spontaneous pneumothorax ,  treated w/ chest tube   Hypertension    Malignant neoplasm of prostate [facility]) urologist-  dr dahlstedt/  oncologist -- dr patrcia   dx 12-02-2016-- Stage T2b, Gleason 3+4,  PSA 5.99,  vol 25cc   Neuropathy    Numbness of left foot    outer left ankle numb-- per pt has appt. w/ neurologist   OSA (obstructive sleep apnea)    per pt has  not used cpap over year ago from 04-16-2017--- per study 01-17-2010  severe osa   Pneumonia    Sleep apnea 2013   Solid nodule of lung greater than 8 mm in diameter 05/11/2018   03/2018: RUL Nodule, 10mm   Wears glasses       03/21/2024 Discussed the use of AI scribe software for clinical note transcription with the patient, who gave verbal consent to proceed.  History of Present Illness Pt. Presents for follow up after PET scan to further evaluate an enlarging pulmonary lung nodule noted on a lung cancer screening scan. Pt. States he is at baseline from a respiratory standpoint. We have reviewed his PET scan results. There was no hypermetabolic activity on the scan , favoring a benign etiology, possibly postinflammatory scarring. Recommendation is for a 6 month follow up scan per radiology. We will follow lung cancer screening protocol and do a 3 month follow up scan through the screening program. Pt. Has had no unintentional weight loss. No coughing up blood. He is compliant with his Trelegy daily. He takes Monocycline chronically for skin  issues, he will need to be treated with another drug class for respiratory issues should he need treatment for flare.     Test Results: PET  Scan 03/10/2024 IMPRESSION: 1. The left upper lobe lesion of concern on recent CT demonstrates no hypermetabolic activity, favoring a benign etiology, possibly postinflammatory scarring. Given relative stability from 08/20/2023 CT, recommend continued CT follow-up in 6-12 months. 2. The chronic irregular nodule anteriorly in the right upper lobe demonstrates low level metabolic activity, also favoring a benign etiology. 3. No concerning metabolic activity or evidence of metastatic disease. 4. Aortic Atherosclerosis (ICD10-I70.0) and Emphysema (ICD10-J43.9).   LDCT 02/22/2024 Interval increase in size of a spiculated left upper lobe pulmonary nodule with a mean diameter of 10.3 mm. This is highly suspicious for  a primary pulmonary malignancy. Lung-RADS 4B, suspicious. Chest CT with or without contrast, PET/CT and/or tissue sampling depending on the probability of malignancy and comorbidities. PET/CT may be used when there is a = 8 mm (= 268 mm) solid component. These results will be called to the ordering clinician or representative by the Radiologist Assistant, and communication documented in the PACS or [employer]. 2. Severe emphysema. 3. Extensive multi-vessel coronary artery calcification. 4. Part solid nodule within the right upper lobe is stable since remote prior examination of 08/16/2021 and is benign.     Latest Ref Rng & Units 10/02/2023    8:09 AM 06/02/2023    8:13 AM 10/28/2022    4:44 AM  CBC  WBC 3.4 - 10.8 x10E3/uL 5.2  6.0  11.0   Hemoglobin 13.0 - 17.7 g/dL 84.8  84.9  84.2   Hematocrit 37.5 - 51.0 % 43.1  45.7  45.7   Platelets 150 - 450 x10E3/uL 130  168  103        Latest Ref Rng & Units 10/02/2023    8:09 AM 06/02/2023    8:13 AM 10/28/2022    4:44 AM  BMP  Glucose 70 - 99 mg/dL 95  98  875   BUN 8 - 27 mg/dL 12  14  10    Creatinine 0.76 - 1.27 mg/dL 8.81  8.92  9.33   BUN/Creat Ratio 10 - 24 10  13     Sodium 134 - 144 mmol/L 142  144  132   Potassium 3.5 - 5.2 mmol/L 4.0  3.8  3.6   Chloride 96 - 106 mmol/L 103  104  98   CO2 20 - 29 mmol/L 22  24  26    Calcium  8.6 - 10.2 mg/dL 9.5  9.4  8.4     BNP No results found for: BNP  ProBNP No results found for: PROBNP  PFT    Component Value Date/Time   FEV1PRE 2.41 07/04/2021 1547   FEV1POST 2.58 07/04/2021 1547   FVCPRE 3.57 07/04/2021 1547   FVCPOST 3.51 07/04/2021 1547   TLC 5.91 07/04/2021 1547   DLCOUNC 19.86 07/04/2021 1547   PREFEV1FVCRT 67 07/04/2021 1547   PSTFEV1FVCRT 74 07/04/2021 1547    NM PET Image Initial (PI) Skull Base To Thigh Result Date: 03/18/2024 CLINICAL DATA:  Initial treatment strategy for enlarging spiculated left upper lobe nodule on lung cancer screening CT.  EXAM: NUCLEAR MEDICINE PET SKULL BASE TO THIGH TECHNIQUE: 9.0 mCi F-18 FDG was injected intravenously. Full-ring PET imaging was performed from the skull base to thigh after the radiotracer. CT data was obtained and used for attenuation correction and anatomic localization. Fasting blood glucose: 116 mg/dl COMPARISON:  Previous PET-CT 03/23/2018. Chest CT 02/22/2024, 08/20/2023 and 10/26/2022. FINDINGS: Mediastinal blood pool activity: SUV max 2.0 NECK: No hypermetabolic cervical lymph nodes are identified. No suspicious activity identified within the  pharyngeal mucosal space. Incidental CT findings: Bilateral carotid atherosclerosis. Ethmoid and maxillary sinus mucosal thickening bilaterally. CHEST: There are no hypermetabolic mediastinal, hilar or axillary lymph nodes. No hypermetabolic pulmonary activity. The left upper lobe lesion of concern on recent CT measures approximately 1.1 x 0.7 cm on image 28/203 and demonstrates no hypermetabolic activity (SUV max 0.6). The chronic irregular nodule anteriorly in the right upper lobe measures approximately 1.2 x 0.5 cm on image 18/203 and has an SUV max of 1.4. No enlarging nodules are identified. Incidental CT findings: Severe centrilobular and paraseptal emphysema. Atherosclerosis of the aorta, great vessels and coronary arteries. Stable mild cardiomegaly. ABDOMEN/PELVIS: There is no hypermetabolic activity within the liver, adrenal glands, spleen or pancreas. There is no hypermetabolic nodal activity in the abdomen or pelvis. Incidental CT findings: Chronic pancreatic parenchymal calcifications consistent with chronic calcific pancreatitis. Bilateral renal cysts for which no specific follow-up imaging is recommended. Aortic and branch vessel atherosclerosis. Postsurgical changes in the colon and prostate brachytherapy seeds are noted. SKELETON: There is no hypermetabolic activity to suggest osseous metastatic disease. Incidental CT findings: Status post bilateral  total hip arthroplasty. Stable sclerotic lesion posteriorly in the right iliac bone, without metabolic activity. IMPRESSION: 1. The left upper lobe lesion of concern on recent CT demonstrates no hypermetabolic activity, favoring a benign etiology, possibly postinflammatory scarring. Given relative stability from 08/20/2023 CT, recommend continued CT follow-up in 6-12 months. 2. The chronic irregular nodule anteriorly in the right upper lobe demonstrates low level metabolic activity, also favoring a benign etiology. 3. No concerning metabolic activity or evidence of metastatic disease. 4. Aortic Atherosclerosis (ICD10-I70.0) and Emphysema (ICD10-J43.9). Electronically Signed   By: Elsie Perone M.D.   On: 03/18/2024 11:33   CT CHEST LCS NODULE F/U LOW DOSE WO CONTRAST Result Date: 02/28/2024 CLINICAL DATA:  Lung nodule, < 6mm, high cancer risk 6 Month Nodule F/U EXAM: CT CHEST WITHOUT CONTRAST FOR LUNG CANCER SCREENING NODULE FOLLOW-UP TECHNIQUE: Multidetector CT imaging of the chest was performed following the standard protocol without IV contrast. RADIATION DOSE REDUCTION: This exam was performed according to the departmental dose-optimization program which includes automated exposure control, adjustment of the mA and/or kV according to patient size and/or use of iterative reconstruction technique. COMPARISON:  08/20/2023 FINDINGS: Cardiovascular: Extensive multi-vessel coronary artery calcification. Global cardiac size iswithin normal limits. No pericardial effusion. Central pulmonary arteries are of normal caliber. Moderate atherosclerotic calcification within the thoracic aorta. No aortic aneurysm. Mediastinum/Nodes: No enlarged mediastinal, hilar, or axillary lymph nodes. Thyroid gland, trachea, and esophagus demonstrate no significant findings. Lungs/Pleura: Severe emphysema. Part solid nodule within the subpleural right upper lobe (57/6) measures 13.9 mm in overall dimension within 8.6 mm solid  component. This, however, is stable since remote prior examination of of 08/16/2021 and is benign. Spiculated nodule within left upper lobe (103/6) demonstrates interval increase in size with a mean diameter of 10.3 mm. See radiologist derived volumetric analysis. No new focal pulmonary nodules identified. No pneumothorax or pleural effusion. No central obstructing lesion. Upper Abdomen: No acute abnormality. Musculoskeletal: No acute bone abnormality. No lytic or blastic bone lesion. Osseous structures are age appropriate. IMPRESSION: 1. Interval increase in size of a spiculated left upper lobe pulmonary nodule with a mean diameter of 10.3 mm. This is highly suspicious for a primary pulmonary malignancy. Lung-RADS 4B, suspicious. Chest CT with or without contrast, PET/CT and/or tissue sampling depending on the probability of malignancy and comorbidities. PET/CT may be used when there is a = 8 mm (= 268  mm) solid component. These results will be called to the ordering clinician or representative by the Radiologist Assistant, and communication documented in the PACS or [employer]. 2. Severe emphysema. 3. Extensive multi-vessel coronary artery calcification. 4. Part solid nodule within the right upper lobe is stable since remote prior examination of 08/16/2021 and is benign. Aortic Atherosclerosis (ICD10-I70.0) and Emphysema (ICD10-J43.9). Electronically Signed   By: Dorethia Molt M.D.   On: 02/28/2024 23:50     Past medical hx Past Medical History:  Diagnosis Date   Bilateral shoulder bursitis    CAD (coronary artery disease)    a. (prior catheterization in 07/2021 showing moderate proximal LAD stenosis but not significant by FFR b.low-risk NST in 07/2022   Chronic back pain    COPD (chronic obstructive pulmonary disease) with emphysema ([name])    DDD (degenerative disc disease), lumbar    hx epideral injection  L5--S1    Diverticulitis    DVT (deep venous thrombosis) ([name]) 11/19/2018   Dysphasia     intermittant w/ food    Dyspnea    Dyspnea on exertion    Emphysema of lung ([name]) 2012   Fatty liver    GERD (gastroesophageal reflux disease)    Gout    L great toe   Heterozygous factor V Leiden mutation ([name])    Hiatal hernia    Hip problem 2020   left, seeing orthopedics   History of acute pancreatitis    alcoholic pancreatitis 11-11-2011 and 01-30-2012. 2021   History of colon polyps    10-17-2005  hyperplastic polyp's   History of esophageal stricture    s/p  dilation 02-02-2017   History of peptic ulcer 1990s   History of pneumococcal pneumonia 2006   bilateral pneumonia w/ left lower lobe abscess treated w/ chest tube with suction for drainage   History of pneumothorax    1984--  left spontaneous pneumothorax ,  treated w/ chest tube   Hypertension    Malignant neoplasm of prostate [facility]) urologist-  dr dahlstedt/  oncologist -- dr patrcia   dx 12-02-2016-- Stage T2b, Gleason 3+4,  PSA 5.99,  vol 25cc   Neuropathy    Numbness of left foot    outer left ankle numb-- per pt has appt. w/ neurologist   OSA (obstructive sleep apnea)    per pt has not used cpap over year ago from 04-16-2017--- per study 01-17-2010  severe osa   Pneumonia    Sleep apnea 2013   Solid nodule of lung greater than 8 mm in diameter 05/11/2018   03/2018: RUL Nodule, 10mm   Wears glasses      Social History   Tobacco Use   Smoking status: Former    Current packs/day: 0.00    Average packs/day: 1 pack/day for 30.0 years (30.0 ttl pk-yrs)    Types: Cigarettes, E-cigarettes    Start date: 10/02/1985    Quit date: 10/03/2015    Years since quitting: 8.4    Passive exposure: Past   Smokeless tobacco: Never   Tobacco comments:    Quit 7years ago  Vaping Use   Vaping status: Former  Substance Use Topics   Alcohol use: Not Currently    Alcohol/week: 5.0 standard drinks of alcohol    Comment: No alcohol over 30 days   Drug use: No    Mr.Tauzin reports that he quit smoking about 8 years  ago. His smoking use included cigarettes and e-cigarettes. He started smoking about 38 years ago. He has a  30 pack-year smoking history. He has been exposed to tobacco smoke. He has never used smokeless tobacco. He reports that he does not currently use alcohol after a past usage of about 5.0 standard drinks of alcohol per week. He reports that he does not use drugs.  Tobacco Cessation: Former smoker with a 30 pack year smoking history, quit 2017.  Past surgical hx, Family hx, Social hx all reviewed.  Current Outpatient Medications on File Prior to Visit  Medication Sig   acetaminophen  (TYLENOL ) 500 MG tablet Take 2 tablets (1,000 mg total) by mouth every 6 (six) hours as needed for mild pain.   albuterol  (VENTOLIN  HFA) 108 (90 Base) MCG/ACT inhaler Inhale 2 puffs into the lungs every 6 (six) hours as needed for wheezing or shortness of breath.   alfuzosin  (UROXATRAL ) 10 MG 24 hr tablet Take 10 mg by mouth every morning.   Alpha-Lipoic Acid 600 MG TABS Take 1 tablet one to two times a day   Cholecalciferol (VITAMIN D3) 25 MCG (1000 UT) CAPS Take 1 capsule (1,000 Units total) by mouth daily.   cycloSPORINE (RESTASIS) 0.05 % ophthalmic emulsion    Fluticasone -Umeclidin-Vilant (TRELEGY ELLIPTA ) 100-62.5-25 MCG/ACT AEPB Inhale 1 puff into the lungs daily.   ipratropium-albuterol  (DUONEB) 0.5-2.5 (3) MG/3ML SOLN Take 3 mLs by nebulization 4 (four) times daily.   minocycline (MINOCIN) 100 MG capsule Take 100 mg by mouth daily.   NON FORMULARY Pt uses a cpap nightly   Omega 3 1000 MG CAPS Take 2 capsules (2,000 mg total) by mouth daily.   psyllium (METAMUCIL) 58.6 % powder Take 1 packet by mouth daily.   warfarin (COUMADIN ) 2 MG tablet TAKE 2 TABLETS BY MOUTH ONCE DAILY OR AS DIRECTED   nitroGLYCERIN  (NITROSTAT ) 0.4 MG SL tablet Place 0.4 mg under the tongue every 5 (five) minutes x 3 doses as needed (if no relief after 2nd dose, proceed to ED or call 911). (Patient not taking: Reported on 03/21/2024)    No current facility-administered medications on file prior to visit.     No Known Allergies  Review Of Systems:  Constitutional:   No  weight loss, night sweats,  Fevers, chills, fatigue, or  lassitude.  HEENT:   No headaches,  Difficulty swallowing,  Tooth/dental problems, or  Sore throat,                No sneezing, itching, ear ache, nasal congestion, post nasal drip,   CV:  No chest pain,  Orthopnea, PND, swelling in lower extremities, anasarca, dizziness, palpitations, syncope.   GI  No heartburn, indigestion, abdominal pain, nausea, vomiting, diarrhea, change in bowel habits, loss of appetite, bloody stools.   Resp: No shortness of breath with exertion or at rest.  No excess mucus, no productive cough,  No non-productive cough,  No coughing up of blood.  No change in color of mucus.  No wheezing.  No chest wall deformity  Skin: no rash or lesions.  GU: no dysuria, change in color of urine, no urgency or frequency.  No flank pain, no hematuria   MS:  No joint pain or swelling.  No decreased range of motion.  No back pain.  Psych:  No change in mood or affect. No depression or anxiety.  No memory loss.   Vital Signs BP 98/60   Pulse (!) 57   Temp 97.6 F (36.4 C) (Oral)   Ht 6' 1 (1.854 m)   Wt 185 lb 6.4 oz (84.1 kg)   SpO2 97%  BMI 24.46 kg/m    Physical Exam:  General- No distress,  A&Ox3, pleasant ENT: No sinus tenderness, TM clear, pale nasal mucosa, no oral exudate,no post nasal drip, no LAN Cardiac: S1, S2, regular rate and rhythm, no murmur Chest: + wheeze/ No rales/ dullness; no accessory muscle use, no nasal flaring, no sternal retractions, diminished per bases Abd.: Soft Non-tender, ND, BS +, Body mass index is 24.46 kg/m.  Ext: No clubbing cyanosis, edema, no obvious deformities Neuro:  normal strength, MAE x 4, A&O x 3 appropriate Skin: No rashes, warm and dry, no obvious skin lesions  Psych: normal mood and behavior    Assessment &  Plan Abnormal Lung cancer screening scan Enlarging, spiculated left upper lobe nodule Former smoker , quit 2017 with a 30 pack year smoking history Plan Your PET scan showed no hypermetabolic activity , which is great news. This favors a benign cause,  possibly postinflammatory scarring. We will do a 3 month follow up scan through the lung cancer screening scan You will get a call to get this scheduled closer to the time.   I spent 25 minutes dedicated to the care of this patient on the date of this encounter to include pre-visit review of records, face-to-face time with the patient discussing conditions above, post visit ordering of testing, clinical documentation with the electronic health record, making appropriate referrals as documented, and communicating necessary information to the patient's healthcare team.      Lauraine JULIANNA Lites, NP 03/21/2024  11:05 AM

## 2024-03-21 NOTE — Patient Instructions (Addendum)
 Your PET scan showed no hypermetabolic activity , which is great news. This favors a benign cause,  possibly postinflammatory scarring. We will do a 3 month follow up scan through the lung cancer screening program  You will get a call to get this scheduled closer to the time.  If this nodule  remains stable we will follow this with a 6 month scan, and hopefully we can return to annual screening. Call if you need us  sooner. Note your daily symptoms > remember red flags for COPD:  Increase in cough, increase in sputum production, increase in shortness of breath or activity intolerance. If you notice these symptoms, please call to be seen.    Continue CPAP nightly as you have been doing.  Please contact office for sooner follow up if symptoms do not improve or worsen or seek emergency care

## 2024-03-22 ENCOUNTER — Ambulatory Visit: Attending: Internal Medicine | Admitting: *Deleted

## 2024-03-22 DIAGNOSIS — Z7901 Long term (current) use of anticoagulants: Secondary | ICD-10-CM

## 2024-03-22 DIAGNOSIS — I2609 Other pulmonary embolism with acute cor pulmonale: Secondary | ICD-10-CM

## 2024-03-22 DIAGNOSIS — Z5181 Encounter for therapeutic drug level monitoring: Secondary | ICD-10-CM | POA: Diagnosis not present

## 2024-03-22 DIAGNOSIS — I82433 Acute embolism and thrombosis of popliteal vein, bilateral: Secondary | ICD-10-CM

## 2024-03-22 LAB — POCT INR: INR: 2.7 (ref 2.0–3.0)

## 2024-03-22 NOTE — Progress Notes (Signed)
 INR 2.7; Please see anticoagulation encounter

## 2024-03-22 NOTE — Patient Instructions (Signed)
Continue warfarin 2 tablets daily except 1 tablet on Sundays Recheck in 6 weeks.  

## 2024-03-23 ENCOUNTER — Encounter: Payer: Self-pay | Admitting: Internal Medicine

## 2024-03-23 ENCOUNTER — Ambulatory Visit: Admitting: Internal Medicine

## 2024-03-23 VITALS — BP 106/67 | HR 61 | Ht 73.0 in | Wt 184.0 lb

## 2024-03-23 DIAGNOSIS — J449 Chronic obstructive pulmonary disease, unspecified: Secondary | ICD-10-CM | POA: Diagnosis not present

## 2024-03-23 DIAGNOSIS — R911 Solitary pulmonary nodule: Secondary | ICD-10-CM

## 2024-03-23 MED ORDER — TRELEGY ELLIPTA 100-62.5-25 MCG/ACT IN AEPB: 1.0000 | INHALATION_SPRAY | Freq: Every day | RESPIRATORY_TRACT | 11 refills | Status: AC

## 2024-03-23 NOTE — Assessment & Plan Note (Addendum)
 Quit smoking 2017  - PFT in July 04, 2021  FEV1 at 62%, ratio 67, FVC 69%, postbronchodilator FEV1 66%, ratio 0.74, DLCO 67%.   - 03/23/2024  p Trelegy  Walked on RA  x  3  lap(s) =  approx 450   ft  @ fast pace, stopped due to end of study  with lowest 02 sats 95%   Although he doesn't actually meet the criteria for copd, he appears to be  Group D (now reclassified as E) in terms of symptom/risk and laba/lama/ICS  therefore appropriate rx at this point >>>  continue trelegy 100 for AB component (unless any anticholinergic side effects) and approp saba:  Re SABA :  I spent extra time with pt today reviewing appropriate use of albuterol  for prn use on exertion with the following points: 1) saba is for relief of sob that does not improve by walking a slower pace or resting but rather if the pt does not improve after trying this first. 2) If the pt is convinced, as many are, that saba helps recover from activity faster then it's easy to tell if this is the case by re-challenging : ie stop, take the inhaler, then p 5 minutes try the exact same activity (intensity of workload) that just caused the symptoms and see if they are substantially diminished or not after saba 3) if there is an activity that reproducibly causes the symptoms, try the saba 15 min before the activity on alternate days   If in fact the saba really does help, then fine to continue to use it prn but advised may need to look closer at the maintenance regimen (Trelegy here, which may actually be over doing it)  being used to achieve better control of airways disease with exertion.   F/u can be q year - call sooner prn   Each maintenance medication was reviewed in detail including emphasizing most importantly the difference between maintenance and prns and under what circumstances the prns are to be triggered using an action plan format where appropriate.  Total time for H and P, chart review, counseling, reviewing dpi/ hfa/ neb  device(s) , directly observing portions of ambulatory 02 saturation study/ and generating customized AVS unique to this office visit / same day charting = 42 min with pt new to me

## 2024-03-23 NOTE — Patient Instructions (Addendum)
 Plan A = Automatic = Always=    Trelegy 100 one click first thing each am   Plan B = Backup (to supplement plan A, not to replace it) Use your albuterol  inhaler as a rescue medication to be used if you can't catch your breath by resting or slowing your pace  or doing a relaxed purse lip breathing pattern.  - The less you use it, the better it will work when you need it. - Ok to use the inhaler up to 2 puffs  every 4 hours if you must but call for appointment if use goes up over your usual need - Don't leave home without it !!  (think of it like the spare tire or starter fluid for your car)   Plan C = Crisis (instead of Plan B but only if Plan B stops working) - only use your albuterol  nebulizer if you first try Plan B and it fails to help > ok to use the nebulizer up to every 4 hours but if start needing it regularly call for immediate appointment   Also  Ok to try albuterol  15 min before an activity (on alternating days)  that you know would usually make you short of breath and see if it makes any difference and if makes none then don't take albuterol  after activity unless you can't catch your breath as this means it's the resting that helps, not the albuterol .   Please schedule a follow up visit in 12  months but call sooner if needed

## 2024-04-01 ENCOUNTER — Ambulatory Visit: Admitting: Neurology

## 2024-04-04 NOTE — Progress Notes (Signed)
 Impression/Assessment:  1.  BPH with symptoms, patient having worsening symptoms, placed on alfuzosin  at last visit.  No significant improvement.  Cystoscopy today revealed no specific obstruction but a somewhat firm prostate secondary to his radiation.  Adequate urinary flow  2.  Grade group 2 prostate cancer, status post brachytherapy about 6-year ago.  Excellent PSA response  Plan:    History of Present Illness:   He underwent TRUS/Bx on 5.1.2018. He had a 4 mm left apical nodule, a PSA of 5.99. Prostatic volume was 17 g. PSA density 0.35.  6/12 cores were positive for adenocarcinoma, all on the left side of the prostate.  3 cores revealed GG 1 pattern 3 cores revealed GG 2 pattern IPSS pre-procedure: 2/0   He underwent I-125 brachytherapy on 9.20.2018.   5.20.2025: First visit in a year.  He is having a heck of a time with lower urinary tract symptoms.  Frequency, urgency, at times urgency incontinence.  Feeling of incomplete emptying.  Nocturia x 2.  IPSS 27/4.  Not on any medical therapy.  Denies blood in his urine or stool.  PSA last year nadir level at 0.04.  7.22.2025: Here today for recheck.  He was given alfuzosin  at his last visit.  He had adequate parameters on uroflow. Cysto--no obstruction.He was continued on alfuzosin , I added oxybutynin .  9.2.2025:   Past Medical History:  Diagnosis Date   Arthritis 2015   Bilateral shoulder bursitis    CAD (coronary artery disease)    a. (prior catheterization in 07/2021 showing moderate proximal LAD stenosis but not significant by FFR b.low-risk NST in 07/2022   Chronic back pain    COPD (chronic obstructive pulmonary disease) with emphysema ([name])    DDD (degenerative disc disease), lumbar    hx epideral injection  L5--S1    Diverticulitis    DVT (deep venous thrombosis) ([name]) 11/19/2018   Dysphasia    intermittant w/ food    Dyspnea    Dyspnea on exertion    Emphysema of lung ([name]) 2012   Fatty liver    GERD  (gastroesophageal reflux disease) 2015   Gout    L great toe   Heterozygous factor V Leiden mutation ([name])    Hiatal hernia    Hip problem 2020   left, seeing orthopedics   History of acute pancreatitis    alcoholic pancreatitis 11-11-2011 and 01-30-2012. 2021   History of colon polyps    10-17-2005  hyperplastic polyp's   History of esophageal stricture    s/p  dilation 02-02-2017   History of peptic ulcer 1990s   History of pneumococcal pneumonia 2006   bilateral pneumonia w/ left lower lobe abscess treated w/ chest tube with suction for drainage   History of pneumothorax    1984--  left spontaneous pneumothorax ,  treated w/ chest tube   Hypertension    Malignant neoplasm of prostate [facility]) urologist-  dr Dionisios Ricci/  oncologist -- dr patrcia   dx 12-02-2016-- Stage T2b, Gleason 3+4,  PSA 5.99,  vol 25cc   Neuropathy    Numbness of left foot    outer left ankle numb-- per pt has appt. w/ neurologist   OSA (obstructive sleep apnea)    per pt has not used cpap over year ago from 04-16-2017--- per study 01-17-2010  severe osa   Pneumonia    Sleep apnea 2013   Solid nodule of lung greater than 8 mm in diameter 05/11/2018   03/2018: RUL Nodule, 10mm   Wears glasses  Past Surgical History:  Procedure Laterality Date   APPENDECTOMY  July 2023   BIOPSY  02/02/2017   Procedure: BIOPSY;  Surgeon: Shaaron Lamar HERO, MD;  [facility]: [facility];  Service: Endoscopy;;  duodenal bx's, esophgeal bx's   BIOPSY  07/30/2017   Procedure: BIOPSY;  Surgeon: Shaaron Lamar HERO, MD;  [facility]: [facility];  Service: Endoscopy;;  esophagus   BIOPSY  02/15/2021   Procedure: BIOPSY;  Surgeon: Shaaron Lamar HERO, MD;  [facility]: [facility];  Service: Endoscopy;;   CARDIOVASCULAR STRESS TEST  09/12/2009   normal nuclear perfusion study w/ no ischemia /  normal LV function and wall motion , ef 57%   COLON SURGERY  July 2023   Colectomy   COLONOSCOPY  2007   Dr. Shaaron: hyperplastic polyps, internal  hemorrhoids    COLONOSCOPY WITH PROPOFOL  N/A 05/03/2018   Dr. Shaaron: diverticulosis in entire colon, multiple polyps in sigmoid, at splenic flexure, and ascending colon, not all polyps removed. Tubular adenomas and inflammatory polyps. Needs 3 year surveilance   COLONOSCOPY WITH PROPOFOL  N/A 02/15/2021   Many polyps without adenomatous changes but all inflammatory in nature with mildly active colitis and chronic inflammatory mucosa.  Repeat in 5 years.   CORONARY PRESSURE/FFR STUDY N/A 08/01/2021   Procedure: INTRAVASCULAR PRESSURE WIRE/FFR STUDY;  Surgeon: Verlin Lonni BIRCH, MD;  Location: [facility];  Service: Cardiovascular;  Laterality: N/A;   EPIDURAL BLOCK INJECTION  2020   ESOPHAGOGASTRODUODENOSCOPY (EGD) WITH PROPOFOL  N/A 02/02/2017   Dr. Shaaron: esophageal stenosis s/p dilation and biopsy, medium sized hiatal hernia, normal duodenum   ESOPHAGOGASTRODUODENOSCOPY (EGD) WITH PROPOFOL  N/A 07/30/2017   Dr. Shaaron: esophageal stenosis s/p dilation and biopsy, medium-sized hiatal hernia, normal duodenum   HEMORRHOID SURGERY  01-21-2008    [facility]   HIP ARTHROPLASTY Bilateral    IR RADIOLOGIST EVAL & MGMT  08/29/2020   JOINT REPLACEMENT  Hip joint 2020/2021   LEFT HEART CATH AND CORONARY ANGIOGRAPHY N/A 08/01/2021   Procedure: LEFT HEART CATH AND CORONARY ANGIOGRAPHY;  Surgeon: Verlin Lonni BIRCH, MD;  Location: [facility];  Service: Cardiovascular;  Laterality: N/A;   MALONEY DILATION N/A 02/02/2017   Procedure: AGAPITO DILATION;  Surgeon: Shaaron Lamar HERO, MD;  [facility]: [facility];  Service: Endoscopy;  Laterality: N/A;   MALONEY DILATION N/A 07/30/2017   Procedure: AGAPITO DILATION;  Surgeon: Shaaron Lamar HERO, MD;  [facility];  Service: Endoscopy;  Laterality: N/A;   POLYPECTOMY  05/03/2018   Procedure: POLYPECTOMY;  Surgeon: Shaaron Lamar HERO, MD;  [facility];  Service: Endoscopy;;  ascending colon polyp, sigmoid colon polyps  (multiple)   POLYPECTOMY  02/15/2021   Procedure: POLYPECTOMY;  Surgeon: Shaaron Lamar HERO, MD;  [facility];  Service: Endoscopy;;   RADIOACTIVE SEED IMPLANT N/A 04/23/2017   Procedure: RADIOACTIVE SEED IMPLANT/BRACHYTHERAPY IMPLANT;  Surgeon: Matilda Senior, MD;  Location: [facility];  Service: Urology;  Laterality: N/A;   SHOULDER ARTHROSCOPY  08/05/2003   SPACE OAR INSTILLATION N/A 04/23/2017   Procedure: SPACE OAR INSTILLATION;  Surgeon: Matilda Senior, MD;  [facility];  Service: Urology;  Laterality: N/A;   TRANSTHORACIC ECHOCARDIOGRAM  03/28/2009   mild LVH, ef 55-60%/ mild aorta calcification   VASECTOMY  08/05/1983   VIDEO ASSISTED THORACOSCOPY (VATS)/DECORTICATION Left     Home Medications:  Allergies as of 04/05/2024   No Known Allergies      Medication List  Accurate as of April 04, 2024  7:49 PM. If you have any questions, ask your nurse or doctor.          acetaminophen  500 MG tablet Commonly known as: TYLENOL  Take 2 tablets (1,000 mg total) by mouth every 6 (six) hours as needed for mild pain.   albuterol  108 (90 Base) MCG/ACT inhaler Commonly known as: VENTOLIN  HFA Inhale 2 puffs into the lungs every 6 (six) hours as needed for wheezing or shortness of breath.   alfuzosin  10 MG 24 hr tablet Commonly known as: UROXATRAL  Take 10 mg by mouth every morning.   Alpha-Lipoic Acid 600 MG Tabs Take 1 tablet one to two times a day   cycloSPORINE 0.05 % ophthalmic emulsion Commonly known as: RESTASIS   ipratropium-albuterol  0.5-2.5 (3) MG/3ML Soln Commonly known as: DUONEB Take 3 mLs by nebulization 4 (four) times daily.   minocycline 100 MG capsule Commonly known as: MINOCIN Take 100 mg by mouth daily.   nitroGLYCERIN  0.4 MG SL tablet Commonly known as: NITROSTAT  Place 0.4 mg under the tongue every 5 (five) minutes x 3 doses as needed (if no relief after 2nd dose, proceed to ED or call  [phone]).   NON FORMULARY Pt uses a cpap nightly   Omega 3 1000 MG Caps Take 2 capsules (2,000 mg total) by mouth daily.   psyllium 58.6 % powder Commonly known as: METAMUCIL Take 1 packet by mouth daily.   Trelegy Ellipta  100-62.5-25 MCG/ACT Aepb Generic drug: Fluticasone -Umeclidin-Vilant Inhale 1 puff into the lungs daily.   Vitamin D3 25 MCG (1000 UT) Caps Take 1 capsule (1,000 Units total) by mouth daily.   warfarin 2 MG tablet Commonly known as: COUMADIN  Take as directed by the anticoagulation clinic. If you are unsure how to take this medication, talk to your nurse or doctor. Original instructions: TAKE 2 TABLETS BY MOUTH ONCE DAILY OR AS DIRECTED        Allergies: No Known Allergies  Family History  Problem Relation Age of Onset   Diabetes Mother    Heart disease Father    Heart disease Brother    Heart disease Brother    Cancer Neg Hx    Colon cancer Neg Hx    Colon polyps Neg Hx    Neuropathy Neg Hx     Social History:  reports that he quit smoking about 8 years ago. His smoking use included cigarettes and e-cigarettes. He started smoking about 38 years ago. He has a 30 pack-year smoking history. He has been exposed to tobacco smoke. He has never used smokeless tobacco. He reports that he does not currently use alcohol after a past usage of about 5.0 standard drinks of alcohol per week. He reports that he does not use drugs.  ROS: A complete review of systems was performed.  All systems are negative except for pertinent findings as noted.  Physical Exam:  Vital signs in last 24 hours: There were no vitals taken for this visit. Constitutional:  Alert and oriented, No acute distress Cardiovascular: Regular rate  Respiratory: Normal respiratory effort Neurologic: Grossly intact, no focal deficits Psychiatric: Normal mood and affect  I have reviewed prior pt notes  I have reviewed urinalysis results--clear  I have independently reviewed prior  imaging-prostate ultrasound  I have reviewed prior PSA/pathology results  Bladder scan reviewed--34 mL  IPSS sheet reviewed

## 2024-04-05 ENCOUNTER — Ambulatory Visit: Admitting: Urology

## 2024-04-05 VITALS — BP 92/55 | HR 66

## 2024-04-05 DIAGNOSIS — Z8546 Personal history of malignant neoplasm of prostate: Secondary | ICD-10-CM | POA: Diagnosis not present

## 2024-04-05 DIAGNOSIS — N401 Enlarged prostate with lower urinary tract symptoms: Secondary | ICD-10-CM

## 2024-04-05 DIAGNOSIS — N521 Erectile dysfunction due to diseases classified elsewhere: Secondary | ICD-10-CM

## 2024-04-05 DIAGNOSIS — N138 Other obstructive and reflux uropathy: Secondary | ICD-10-CM

## 2024-04-05 DIAGNOSIS — N486 Induration penis plastica: Secondary | ICD-10-CM

## 2024-04-05 DIAGNOSIS — R3915 Urgency of urination: Secondary | ICD-10-CM

## 2024-04-05 LAB — URINALYSIS, ROUTINE W REFLEX MICROSCOPIC
Bilirubin, UA: NEGATIVE
Glucose, UA: NEGATIVE
Ketones, UA: NEGATIVE
Leukocytes,UA: NEGATIVE
Nitrite, UA: NEGATIVE
Protein,UA: NEGATIVE
RBC, UA: NEGATIVE
Specific Gravity, UA: <1.005 — ABNORMAL LOW (ref 1.005–1.030)
Urobilinogen, Ur: 0.2 mg/dL (ref 0.2–1.0)
pH, UA: 6 (ref 5.0–7.5)

## 2024-04-08 ENCOUNTER — Ambulatory Visit: Admitting: Neurology

## 2024-04-12 ENCOUNTER — Ambulatory Visit: Payer: Self-pay | Admitting: Internal Medicine

## 2024-04-18 ENCOUNTER — Ambulatory Visit (INDEPENDENT_AMBULATORY_CARE_PROVIDER_SITE_OTHER): Payer: PPO

## 2024-04-18 VITALS — BP 115/65 | Ht 73.0 in | Wt 180.0 lb

## 2024-04-18 DIAGNOSIS — Z0000 Encounter for general adult medical examination without abnormal findings: Secondary | ICD-10-CM | POA: Diagnosis not present

## 2024-04-18 NOTE — Progress Notes (Signed)
 Please attest and cosign this visit due to patients primary care provider not being immediately available at the time the visit was completed.   Subjective:   [name] is a 68 y.o. who presents for a Medicare Wellness preventive visit. As a reminder, Annual Wellness Visits don't include a physical exam, and some assessments may be limited, especially if this visit is performed virtually. We may recommend an in-person follow-up visit with your provider if needed.  Visit Complete: Virtual I connected with  [name] on 04/18/24 by a video and audio enabled telemedicine application and verified that I am speaking with the correct person using two identifiers.  Patient Location: Home  Provider Location: Home Office  I discussed the limitations of evaluation and management by telemedicine. The patient expressed understanding and agreed to proceed.  Vital Signs: Because this visit was a virtual/telehealth visit, some criteria may be missing or patient reported. Any vitals not documented were not able to be obtained and vitals that have been documented are patient reported.  Persons Participating in Visit: Patient.  AWV Questionnaire: Yes: Patient Medicare AWV questionnaire was completed by the patient on 04/15/2024; I have confirmed that all information answered by patient is correct and no changes since this date. Cardiac Risk Factors include: advanced age (>31men, >22 women);dyslipidemia;hypertension;male gender;smoking/ tobacco exposure;Other (see comment), Risk factor comments: OSA, history of DVT     Objective:    Today's Vitals   04/18/24 0758  BP: 115/65  Weight: 180 lb (81.6 kg)  Height: 6' 1 (1.854 m)   Body mass index is 23.75 kg/m.    04/18/2024    8:24 AM 04/13/2023   10:10 AM 10/26/2022    9:27 AM 07/29/2022   10:00 AM 07/28/2022   10:24 PM 07/10/2022    8:42 AM 02/12/2022    4:50 PM  Advanced Directives  Does Patient Have a Medical Advance Directive? No  No Yes Yes Yes Yes Yes  Type of Advance Directive   Living will;Healthcare Power of [employer] Power of [place];Living will Healthcare Power of [place];Living will Healthcare Power of [place];Living will Healthcare Power of Attorney  Does patient want to make changes to medical advance directive?   No - Patient declined No - Patient declined  No - Patient declined No - Patient declined  Copy of Healthcare Power of Attorney in Chart?   No - copy requested No - copy requested  No - copy requested No - copy requested  Would patient like information on creating a medical advance directive? No - Patient declined No - Patient declined No - Patient declined        Current Medications (verified) Outpatient Encounter Medications as of 04/18/2024  Medication Sig   acetaminophen  (TYLENOL ) 500 MG tablet Take 2 tablets (1,000 mg total) by mouth every 6 (six) hours as needed for mild pain.   albuterol  (VENTOLIN  HFA) 108 (90 Base) MCG/ACT inhaler Inhale 2 puffs into the lungs every 6 (six) hours as needed for wheezing or shortness of breath.   Cholecalciferol (VITAMIN D3) 25 MCG (1000 UT) CAPS Take 1 capsule (1,000 Units total) by mouth daily.   Fluticasone -Umeclidin-Vilant (TRELEGY ELLIPTA ) 100-62.5-25 MCG/ACT AEPB Inhale 1 puff into the lungs daily.   ipratropium-albuterol  (DUONEB) 0.5-2.5 (3) MG/3ML SOLN Take 3 mLs by nebulization 4 (four) times daily.   minocycline (MINOCIN) 100 MG capsule Take 100 mg by mouth daily.   nitroGLYCERIN  (NITROSTAT ) 0.4 MG SL tablet Place 0.4 mg under the tongue every 5 (five)  minutes x 3 doses as needed (if no relief after 2nd dose, proceed to ED or call 911).   NON FORMULARY Pt uses a cpap nightly   Omega 3 1000 MG CAPS Take 2 capsules (2,000 mg total) by mouth daily.   psyllium (METAMUCIL) 58.6 % powder Take 1 packet by mouth daily.   warfarin (COUMADIN ) 2 MG tablet TAKE 2 TABLETS BY MOUTH ONCE DAILY OR AS DIRECTED   [DISCONTINUED] alfuzosin  (UROXATRAL ) 10 MG 24  hr tablet Take 10 mg by mouth every morning.   [DISCONTINUED] Alpha-Lipoic Acid 600 MG TABS Take 1 tablet one to two times a day   [DISCONTINUED] cycloSPORINE (RESTASIS) 0.05 % ophthalmic emulsion    No facility-administered encounter medications on file as of 04/18/2024.    Allergies (verified) Patient has no known allergies.   History: Past Medical History:  Diagnosis Date   Arthritis 2015   Bilateral shoulder bursitis    CAD (coronary artery disease)    a. (prior catheterization in 07/2021 showing moderate proximal LAD stenosis but not significant by FFR b.low-risk NST in 07/2022   Chronic back pain    COPD (chronic obstructive pulmonary disease) with emphysema ([name])    DDD (degenerative disc disease), lumbar    hx epideral injection  L5--S1    Diverticulitis    DVT (deep venous thrombosis) ([name]) 11/19/2018   Dysphasia    intermittant w/ food    Dyspnea    Dyspnea on exertion    Emphysema of lung ([name]) 2012   Fatty liver    GERD (gastroesophageal reflux disease) 2015   Gout    L great toe   Heterozygous factor V Leiden mutation ([name])    Hiatal hernia    Hip problem 2020   left, seeing orthopedics   History of acute pancreatitis    alcoholic pancreatitis 11-11-2011 and 01-30-2012. 2021   History of colon polyps    10-17-2005  hyperplastic polyp's   History of esophageal stricture    s/p  dilation 02-02-2017   History of peptic ulcer 1990s   History of pneumococcal pneumonia 2006   bilateral pneumonia w/ left lower lobe abscess treated w/ chest tube with suction for drainage   History of pneumothorax    1984--  left spontaneous pneumothorax ,  treated w/ chest tube   Hypertension    Malignant neoplasm of prostate [facility]) urologist-  dr dahlstedt/  oncologist -- dr patrcia   dx 12-02-2016-- Stage T2b, Gleason 3+4,  PSA 5.99,  vol 25cc   Neuropathy    Numbness of left foot    outer left ankle numb-- per pt has appt. w/ neurologist   OSA (obstructive sleep apnea)     per pt has not used cpap over year ago from 04-16-2017--- per study 01-17-2010  severe osa   Pneumonia    Sleep apnea 2013   Solid nodule of lung greater than 8 mm in diameter 05/11/2018   03/2018: RUL Nodule, 10mm   Wears glasses    Past Surgical History:  Procedure Laterality Date   APPENDECTOMY  July 2023   BIOPSY  02/02/2017   Procedure: BIOPSY;  Surgeon: Shaaron Lamar HERO, MD;  [facility];  Service: Endoscopy;;  duodenal bx's, esophgeal bx's   BIOPSY  07/30/2017   Procedure: BIOPSY;  Surgeon: Shaaron Lamar HERO, MD;  [facility];  Service: Endoscopy;;  esophagus   BIOPSY  02/15/2021   Procedure: BIOPSY;  Surgeon: Shaaron Lamar HERO, MD;  [facility];  Service: Endoscopy;;   CARDIOVASCULAR STRESS TEST  09/12/2009   normal nuclear perfusion study w/ no ischemia /  normal LV function and wall motion , ef 57%   COLON SURGERY  July 2023   Colectomy   COLONOSCOPY  2007   Dr. Shaaron: hyperplastic polyps, internal hemorrhoids    COLONOSCOPY WITH PROPOFOL  N/A 05/03/2018   Dr. Shaaron: diverticulosis in entire colon, multiple polyps in sigmoid, at splenic flexure, and ascending colon, not all polyps removed. Tubular adenomas and inflammatory polyps. Needs 3 year surveilance   COLONOSCOPY WITH PROPOFOL  N/A 02/15/2021   Many polyps without adenomatous changes but all inflammatory in nature with mildly active colitis and chronic inflammatory mucosa.  Repeat in 5 years.   CORONARY PRESSURE/FFR STUDY N/A 08/01/2021   Procedure: INTRAVASCULAR PRESSURE WIRE/FFR STUDY;  Surgeon: Verlin Lonni BIRCH, MD;  [facility];  Service: Cardiovascular;  Laterality: N/A;   EPIDURAL BLOCK INJECTION  2020   ESOPHAGOGASTRODUODENOSCOPY (EGD) WITH PROPOFOL  N/A 02/02/2017   Dr. Shaaron: esophageal stenosis s/p dilation and biopsy, medium sized hiatal hernia, normal duodenum   ESOPHAGOGASTRODUODENOSCOPY (EGD) WITH PROPOFOL  N/A 07/30/2017   Dr. Shaaron: esophageal stenosis  s/p dilation and biopsy, medium-sized hiatal hernia, normal duodenum   HEMORRHOID SURGERY  01-21-2008    [facility]   HIP ARTHROPLASTY Bilateral    IR RADIOLOGIST EVAL & MGMT  08/29/2020   JOINT REPLACEMENT  Hip joint 2020/2021   LEFT HEART CATH AND CORONARY ANGIOGRAPHY N/A 08/01/2021   Procedure: LEFT HEART CATH AND CORONARY ANGIOGRAPHY;  Surgeon: Verlin Lonni BIRCH, MD;  [facility];  Service: Cardiovascular;  Laterality: N/A;   MALONEY DILATION N/A 02/02/2017   Procedure: AGAPITO DILATION;  Surgeon: Shaaron Lamar HERO, MD;  [facility];  Service: Endoscopy;  Laterality: N/A;   MALONEY DILATION N/A 07/30/2017   Procedure: AGAPITO DILATION;  Surgeon: Shaaron Lamar HERO, MD;  [facility];  Service: Endoscopy;  Laterality: N/A;   POLYPECTOMY  05/03/2018   Procedure: POLYPECTOMY;  Surgeon: Shaaron Lamar HERO, MD;  [facility];  Service: Endoscopy;;  ascending colon polyp, sigmoid colon polyps (multiple)   POLYPECTOMY  02/15/2021   Procedure: POLYPECTOMY;  Surgeon: Shaaron Lamar HERO, MD;  [facility];  Service: Endoscopy;;   RADIOACTIVE SEED IMPLANT N/A 04/23/2017   Procedure: RADIOACTIVE SEED IMPLANT/BRACHYTHERAPY IMPLANT;  Surgeon: Matilda Senior, MD;  Location: [facility];  Service: Urology;  Laterality: N/A;   SHOULDER ARTHROSCOPY  08/05/2003   SPACE OAR INSTILLATION N/A 04/23/2017   Procedure: SPACE OAR INSTILLATION;  Surgeon: Matilda Senior, MD;  Location: [facility];  Service: Urology;  Laterality: N/A;   TRANSTHORACIC ECHOCARDIOGRAM  03/28/2009   mild LVH, ef 55-60%/ mild aorta calcification   VASECTOMY  08/05/1983   VIDEO ASSISTED THORACOSCOPY (VATS)/DECORTICATION Left    Family History  Problem Relation Age of Onset   Diabetes Mother    Heart disease Father    Heart disease Brother    Heart disease Brother    Cancer Maternal Uncle    Colon cancer Neg Hx    Colon polyps Neg Hx     Neuropathy Neg Hx    Social History   Socioeconomic History   Marital status: Married    Spouse name: Not on file   Number of children: 2   Years of education: Not on file   Highest education level: Some college, no degree  Occupational History   Not on file  Tobacco  Use   Smoking status: Former    Current packs/day: 0.00    Average packs/day: 1 pack/day for 30.0 years (30.0 ttl pk-yrs)    Types: Cigarettes, E-cigarettes    Start date: 10/02/1985    Quit date: 10/03/2015    Years since quitting: 8.5    Passive exposure: Past   Smokeless tobacco: Never   Tobacco comments:    Quit 7years ago  Vaping Use   Vaping status: Former  Substance and Sexual Activity   Alcohol use: Not Currently    Alcohol/week: 5.0 standard drinks of alcohol    Comment: No alcohol over 30 days   Drug use: No   Sexual activity: Yes    Birth control/protection: Other-see comments, None    Comment: Vasectomy  Other Topics Concern   Not on file  Social History Narrative   Lives at home with his wife   Right handed   Social Drivers of Health   Financial Resource Strain: Low Risk  (04/18/2024)   Overall Financial Resource Strain (CARDIA)    Difficulty of Paying Living Expenses: Not hard at all  Food Insecurity: No Food Insecurity (04/18/2024)   Hunger Vital Sign    Worried About Running Out of Food in the Last Year: Never true    Ran Out of Food in the Last Year: Never true  Transportation Needs: No Transportation Needs (04/18/2024)   PRAPARE - Administrator, Civil Service (Medical): No    Lack of Transportation (Non-Medical): No  Physical Activity: Sufficiently Active (04/18/2024)   Exercise Vital Sign    Days of Exercise per Week: 7 days    Minutes of Exercise per Session: 30 min  Stress: No Stress Concern Present (04/18/2024)   [employer] of Occupational Health - Occupational Stress Questionnaire    Feeling of Stress: Not at all  Social Connections: Socially Integrated  (04/18/2024)   Social Connection and Isolation Panel    Frequency of Communication with Friends and Family: More than three times a week    Frequency of Social Gatherings with Friends and Family: Twice a week    Attends Religious Services: More than 4 times per year    Active Member of [employer] or Organizations: Yes    Attends Engineer, structural: More than 4 times per year    Marital Status: Married    Tobacco Counseling Counseling given: Yes Tobacco comments: Quit 7years ago   Clinical Intake: Pre-visit preparation completed: Yes Pain : No/denies pain   BMI - recorded: 23.75 Nutritional Status: BMI of 19-24  Normal Nutritional Risks: None Diabetes: No Lab Results  Component Value Date   HGBA1C 5.8 (H) 06/02/2023   HGBA1C 5.3 10/10/2019    How often do you need to have someone help you when you read instructions, pamphlets, or other written materials from your doctor or pharmacy?: 1 - Never Interpreter Needed?: No Information entered by :: Antonino Nienhuis W CMA (AAMA)  Activities of Daily Living     04/15/2024    3:31 PM  In your present state of health, do you have any difficulty performing the following activities:  Hearing? 0  Vision? 0  Difficulty concentrating or making decisions? 0  Walking or climbing stairs? 0  Dressing or bathing? 0  Doing errands, shopping? 0  Preparing Food and eating ? N  Using the Toilet? N  In the past six months, have you accidently leaked urine? Y  Do you have problems with loss of bowel control? N  Managing  your Medications? N  Managing your Finances? N  Housekeeping or managing your Housekeeping? N   Patient Care Team: Del Wilhelmena Falter, Hilario, FNP as PCP - General (Family Medicine) Shaaron Lamar HERO, MD as Consulting Physician (Gastroenterology) Johnson Laymon HERO RIGGERS as Physician Assistant (Cardiology) Darlean Ozell NOVAK, MD as Consulting Physician (Pulmonary Disease) Mallipeddi, Vishnu P, MD as Consulting Physician  (Cardiology)   I have updated your Care Teams any recent Medical Services you may have received from other providers in the past year.     Assessment:   This is a routine wellness examination for [employer].  Hearing/Vision screen Hearing Screening - Comments:: Patient denies any hearing difficulties.   Vision Screening - Comments:: Wears rx glasses - up to date with routine eye exams  Goals Addressed               This Visit's Progress     Patient Stated (pt-stated)   On track     Play more golf, get my boat wet, and go fishing more, and my primary goal is to get a 6 pack         Depression Screen     04/18/2024    8:30 AM 11/17/2023    1:13 PM 09/17/2023    8:53 AM 04/13/2023   10:11 AM 03/04/2023    9:50 AM 01/28/2023    9:55 AM 01/05/2017    1:37 PM  PHQ 2/9 Scores  PHQ - 2 Score 0 0 0 0 0 0 0  PHQ- 9 Score 0 0  0 0 0      Fall Risk     04/15/2024    3:31 PM 11/17/2023    1:13 PM 09/17/2023    8:53 AM 04/09/2023    7:57 PM 03/04/2023    9:49 AM  Fall Risk   Falls in the past year? 0 0 0 0 0  Number falls in past yr: 0 0 0 0 0  Injury with Fall? 0 0 0 0 0  Risk for fall due to : No Fall Risks No Fall Risks  No Fall Risks No Fall Risks  Follow up Falls evaluation completed;Education provided;Falls prevention discussed Falls evaluation completed Falls evaluation completed Falls prevention discussed Falls evaluation completed    MEDICARE RISK AT HOME:  Medicare Risk at Home Any stairs in or around the home?: (Patient-Rptd) Yes If so, are there any without handrails?: (Patient-Rptd) No Home free of loose throw rugs in walkways, pet beds, electrical cords, etc?: (Patient-Rptd) Yes Adequate lighting in your home to reduce risk of falls?: (Patient-Rptd) Yes Life alert?: (Patient-Rptd) No Use of a cane, walker or w/c?: (Patient-Rptd) No Grab bars in the bathroom?: (Patient-Rptd) Yes Shower chair or bench in shower?: (Patient-Rptd) Yes Elevated toilet seat or a  handicapped toilet?: (Patient-Rptd) No  TIMED UP AND GO: Was the test performed?  No  Cognitive Function: 6CIT completed        04/18/2024    8:29 AM 04/13/2023   10:11 AM  6CIT Screen  What Year? 0 points 0 points  What month? 0 points 0 points  What time? 0 points 0 points  Count back from 20 0 points 0 points  Months in reverse 0 points 0 points  Repeat phrase 0 points 0 points  Total Score 0 points 0 points    Immunizations Immunization History  Administered Date(s) Administered   Fluad Quad(high Dose 65+) 04/17/2022, 05/25/2023   Influenza Whole 05/30/2019   Moderna Covid-19 Fall Seasonal Vaccine  58yrs & older 05/01/2022, 05/25/2023   Moderna Covid-19 Vaccine Bivalent Booster 21yrs & up 06/03/2021   PFIZER(Purple Top)SARS-COV-2 Vaccination 10/27/2019, 11/19/2019, 06/01/2020   PNEUMOCOCCAL CONJUGATE-20 05/05/2022   Respiratory Syncytial Virus Vaccine,Recomb Aduvanted(Arexvy) 04/17/2022, 08/23/2022   Tdap 08/22/2016   Zoster Recombinant(Shingrix) 05/13/2022    Screening Tests Health Maintenance  Topic Date Due   COVID-19 Vaccine (7 - Pfizer risk 2024-25 season) 04/04/2024   Influenza Vaccine  11/01/2024 (Originally 03/04/2024)   Lung Cancer Screening  02/21/2025   Medicare Annual Wellness (AWV)  04/18/2025   Colonoscopy  02/15/2026   DTaP/Tdap/Td (2 - Td or Tdap) 08/22/2026   Pneumococcal Vaccine: 50+ Years  Completed   Hepatitis C Screening  Completed   Zoster Vaccines- Shingrix  Completed   HPV VACCINES  Aged Out   Meningococcal B Vaccine  Aged Out    Health Maintenance Health Maintenance Due  Topic Date Due   COVID-19 Vaccine (7 - Pfizer risk 2024-25 season) 04/04/2024   Health Maintenance Items Addressed: Routine health screenings are up to date. Patient is aware of recommended vaccines.   Additional Screening: Vision Screening: Recommended annual ophthalmology exams for early detection of glaucoma and other disorders of the eye. Would you like a  referral to an eye doctor? No    Dental Screening: Recommended annual dental exams for proper oral hygiene  Community Resource Referral / Chronic Care Management: CRR required this visit?  No   CCM required this visit?  No  Plan:   I have personally reviewed and noted the following in the patient's chart:   Medical and social history Use of alcohol, tobacco or illicit drugs  Current medications and supplements including opioid prescriptions. Patient is not currently taking opioid prescriptions. Functional ability and status Nutritional status Physical activity Advanced directives List of other physicians Hospitalizations, surgeries, and ER visits in previous 12 months Vitals Screenings to include cognitive, depression, and falls Referrals and appointments  In addition, I have reviewed and discussed with patient certain preventive protocols, quality metrics, and best practice recommendations. A written personalized care plan for preventive services as well as general preventive health recommendations were provided to patient.   Moksha Dorgan, CMA   04/18/2024   After Visit Summary: (MyChart) Due to this being a telephonic visit, the after visit summary with patients personalized plan was offered to patient via MyChart   Notes: Nothing significant to report at this time.

## 2024-04-18 NOTE — Patient Instructions (Signed)
 [name],  Thank you for taking the time for your Medicare Wellness Visit. I appreciate your continued commitment to your health goals. Please review the care plan we discussed, and feel free to reach out if I can assist you further.    Ongoing Care Seeing your primary care provider every 3 to 6 months helps us  monitor your health and provide consistent, personalized care.     Referrals  If a referral was made during today's visit and you haven't received any updates within two weeks, please contact the referred provider directly to check on the status.  N/A  Wishing you good health and many blessings for the year to come  -Yue Flanigan    Medicare recommends these wellness visits once per year to help you and your care team stay ahead of potential health issues. These visits are designed to focus on prevention, allowing your provider to concentrate on managing your acute and chronic conditions during your regular appointments.     Please note that Annual Wellness Visits do not include a physical exam. Some assessments may be limited, especially if the visit was conducted virtually. If needed, we may recommend a separate in-person follow-up with your provider.  Recommended Screenings:  Health Maintenance  Topic Date Due   COVID-19 Vaccine (7 - Pfizer risk 2024-25 season) 04/04/2024   Flu Shot  11/01/2024*   Screening for Lung Cancer  02/21/2025   Medicare Annual Wellness Visit  04/18/2025   Colon Cancer Screening  02/15/2026   DTaP/Tdap/Td vaccine (2 - Td or Tdap) 08/22/2026   Pneumococcal Vaccine for age over 77  Completed   Hepatitis C Screening  Completed   Zoster (Shingles) Vaccine  Completed   HPV Vaccine  Aged Out   Meningitis B Vaccine  Aged Out  *Topic was postponed. The date shown is not the original due date.       04/18/2024    8:24 AM  Advanced Directives  Does Patient Have a Medical Advance Directive? No  Would patient like information on creating a medical advance  directive? No - Patient declined    Advance Care Planning is important because it: Ensures you receive medical care that aligns with your values, goals, and preferences. Provides guidance to your family and loved ones, reducing the emotional burden of decision-making during critical moments.    Vision: Annual vision screenings are recommended for early detection of glaucoma, cataracts, and diabetic retinopathy. These exams can also reveal signs of chronic conditions such as diabetes and high blood pressure.    Dental: Annual dental screenings help detect early signs of oral cancer, gum disease, and other conditions linked to overall health, including heart disease and diabetes.  Please see the attached documents for additional preventive care recommendations.

## 2024-04-19 ENCOUNTER — Institutional Professional Consult (permissible substitution): Admitting: Neurology

## 2024-04-22 ENCOUNTER — Ambulatory Visit: Payer: Self-pay | Admitting: General Surgery

## 2024-04-25 ENCOUNTER — Telehealth: Payer: Self-pay

## 2024-04-25 NOTE — Telephone Encounter (Signed)
 Hey [place]. You recently saw this patient on 03/03/2024 at which time it sounds like he was doing well from a cardiac standpoint. It sounds like he would be at acceptable risk for surgery but wanted to confirm with you since you recently saw him. Are you able to comment on this?  Thank you! Yeiden Frenkel

## 2024-04-25 NOTE — Telephone Encounter (Signed)
   Pre-operative Risk Assessment    Patient Name: [name]  DOB: 1955-08-12 MRN: [id number]   Date of last office visit: 03/03/24 [name], PA-C Date of next office visit: NONE   Request for Surgical Clearance     Procedure:  ROBOTIC UMBILICAL HERNIA SURGERY  Date of Surgery:  Clearance TBD                                Surgeon:  CORDELLA IDLER, MD Surgeon's Group or Practice Name:  [facility] Phone number:  [phone] Fax number:  [phone]  ATTN: ROSELINE ARGYLE, CMA   Type of Clearance Requested:   - Medical  - Pharmacy:  Hold Warfarin (Coumadin )     Type of Anesthesia:  General    Additional requests/questions:    Signed, [name]   04/25/2024, 2:02 PM

## 2024-04-25 NOTE — Telephone Encounter (Signed)
 Pharmacy, can you please give recommendations for holding Coumadin  for upcoming procedure?  Thank you!

## 2024-04-28 ENCOUNTER — Telehealth: Payer: Self-pay

## 2024-04-28 NOTE — Telephone Encounter (Signed)
   Pre-operative Risk Assessment    Patient Name: [name]  DOB: 06-29-1956 MRN: [id number]   Date of last office visit: 03/03/24  [name], PA Date of next office visit: NA   Request for Surgical Clearance    Procedure:  Robotic Umbilical Hernia Surgery  Date of Surgery:  Clearance TBD                               Surgeon:  Cordella Idler, MD Surgeon's Group or Practice Name:  [facility] Phone number:  [phone] Fax number:  [phone]   Type of Clearance Requested:   - Medical  - Pharmacy:  Hold Warfarin (Coumadin )     Type of Anesthesia:  General    Additional requests/questions:    [name]   04/28/2024, 3:16 PM

## 2024-05-02 NOTE — Telephone Encounter (Signed)
 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    Procedure:  ROBOTIC UMBILICAL HERNIA SURGERY  Date of procedure: TBD  CrCl 88.96 ml/min Platelet count 119  Per office protocol, patient can hold warfarin for 5 days prior to procedure.    Patient WILL need bridging with Lovenox  (enoxaparin ) around procedure.  Patient should let [name] at [facility]  clinic know as soon as procedure is scheduled.  **This guidance is not considered finalized until pre-operative APP has relayed final recommendations.**

## 2024-05-02 NOTE — Telephone Encounter (Signed)
   Primary Cardiologist: None  Chart reviewed as part of pre-operative protocol coverage. Given past medical history and time since last visit, based on ACC/AHA guidelines, [name] would be at acceptable risk for the planned procedure without further cardiovascular testing.   Patient should contact our office if he is having new symptoms that are concerning from a cardiac perspective to arrange a follow-up appointment.   Per office protocol, patient can hold warfarin for 5 days prior to procedure.    Patient WILL need bridging with Lovenox  (enoxaparin ) around procedure.  Patient should let [name] at Coumadin  clinic know as soon as procedure is scheduled.  I will route this recommendation to the requesting party via Epic fax function and remove from pre-op pool.  Please call with questions.  [name], NP-C 05/02/2024, 12:38 PM [address], [address] [place], [place] [address] Office [phone] Fax [phone]

## 2024-05-02 NOTE — Telephone Encounter (Signed)
 Noted.  Will arrange Lovenox  bridge when surgical date is determined.

## 2024-05-02 NOTE — Telephone Encounter (Signed)
 Duplicate request. Please see encounter from 9/22

## 2024-05-03 ENCOUNTER — Ambulatory Visit: Attending: Internal Medicine | Admitting: *Deleted

## 2024-05-03 DIAGNOSIS — Z5181 Encounter for therapeutic drug level monitoring: Secondary | ICD-10-CM | POA: Diagnosis not present

## 2024-05-03 DIAGNOSIS — I2609 Other pulmonary embolism with acute cor pulmonale: Secondary | ICD-10-CM | POA: Diagnosis not present

## 2024-05-03 DIAGNOSIS — I82433 Acute embolism and thrombosis of popliteal vein, bilateral: Secondary | ICD-10-CM

## 2024-05-03 DIAGNOSIS — Z7901 Long term (current) use of anticoagulants: Secondary | ICD-10-CM | POA: Diagnosis not present

## 2024-05-03 LAB — POCT INR: INR: 2.8 (ref 2.0–3.0)

## 2024-05-03 NOTE — Progress Notes (Signed)
 INR-2.8 Please see anticoagulation encounter

## 2024-05-03 NOTE — Patient Instructions (Signed)
Continue warfarin 2 tablets daily except 1 tablet on Sundays Recheck in 6 weeks.  

## 2024-05-17 ENCOUNTER — Encounter: Payer: Self-pay | Admitting: Acute Care

## 2024-05-19 ENCOUNTER — Telehealth: Payer: Self-pay

## 2024-05-19 NOTE — Telephone Encounter (Signed)
 Scheduled 3 month follow up CT

## 2024-06-13 ENCOUNTER — Ambulatory Visit: Attending: Internal Medicine | Admitting: *Deleted

## 2024-06-13 DIAGNOSIS — Z7901 Long term (current) use of anticoagulants: Secondary | ICD-10-CM

## 2024-06-13 DIAGNOSIS — I82433 Acute embolism and thrombosis of popliteal vein, bilateral: Secondary | ICD-10-CM

## 2024-06-13 DIAGNOSIS — I2609 Other pulmonary embolism with acute cor pulmonale: Secondary | ICD-10-CM | POA: Diagnosis not present

## 2024-06-13 DIAGNOSIS — Z5181 Encounter for therapeutic drug level monitoring: Secondary | ICD-10-CM

## 2024-06-13 LAB — POCT INR: INR: 2.2 (ref 2.0–3.0)

## 2024-06-13 NOTE — Patient Instructions (Signed)
 Pending hernia repair on 07/15/24.  Will hold warfarin 5 days before and bridge with Lovenox  at next appt. Continue warfarin 2 tablets daily except 1 tablet on Sundays Recheck in 3 wk

## 2024-06-13 NOTE — Progress Notes (Signed)
 INR 2.2; Please see anticoagulation encounter

## 2024-06-14 ENCOUNTER — Encounter

## 2024-06-17 ENCOUNTER — Ambulatory Visit (HOSPITAL_COMMUNITY): Admission: RE | Admit: 2024-06-17 | Source: Ambulatory Visit

## 2024-07-04 ENCOUNTER — Ambulatory Visit: Attending: Internal Medicine | Admitting: *Deleted

## 2024-07-04 DIAGNOSIS — I2609 Other pulmonary embolism with acute cor pulmonale: Secondary | ICD-10-CM

## 2024-07-04 DIAGNOSIS — Z7901 Long term (current) use of anticoagulants: Secondary | ICD-10-CM

## 2024-07-04 DIAGNOSIS — Z5181 Encounter for therapeutic drug level monitoring: Secondary | ICD-10-CM | POA: Diagnosis not present

## 2024-07-04 DIAGNOSIS — I82433 Acute embolism and thrombosis of popliteal vein, bilateral: Secondary | ICD-10-CM | POA: Diagnosis not present

## 2024-07-04 LAB — POCT INR: INR: 2.2 (ref 2.0–3.0)

## 2024-07-04 MED ORDER — ENOXAPARIN SODIUM 120 MG/0.8ML IJ SOSY: 120.0000 mg | PREFILLED_SYRINGE | Freq: Two times a day (BID) | INTRAMUSCULAR | 1 refills | Status: AC

## 2024-07-04 NOTE — Patient Instructions (Signed)
 07/15/24  Ventral Hernia Repair  Labs:  02/26/24  Hgb 14.1  Hct 43.9  Plts 119   12/31/23  Scr 0.93  CrCl 91.69  Wt. 84.1kg  Lovenox  120mg  daily at 8am  Rx sent to [facility]  #10 syringes  07/09/24  Last dose of warfarin 07/10/24  No Lovenox  or Warfarin 07/11/24 - 07/13/24  Lovenox  120mg  sq daily at 8am 07/14/24  No Lovenox  12/12  No Lovenox  --------surgery ------warfarin 5mg  pm 12/13  Lovenox  120mg  sq at 1m and warfarin 5mg  pm 12/14 - 12/16  Lovenox  120mg  sq at 53m and warfarin 4mg  pm 12/17  Lovenox  120mg  sq at 80m and INR appt at 9:30am

## 2024-07-04 NOTE — Progress Notes (Signed)
 INR 2.2; Please see anticoagulation encounter

## 2024-07-05 NOTE — Patient Instructions (Addendum)
 SURGICAL WAITING ROOM VISITATION Patients having surgery or a procedure may have no more than 2 support people in the waiting area - these visitors may rotate in the visitor waiting room.   If the patient needs to stay at the hospital during part of their recovery, the visitor guidelines for inpatient rooms apply.  PRE-OP VISITATION  Pre-op nurse will coordinate an appropriate time for 1 support person to accompany the patient in pre-op.  This support person may not rotate.  This visitor will be contacted when the time is appropriate for the visitor to come back in the pre-op area.  Please refer to the [facility] website for the visitor guidelines for Inpatients (after your surgery is over and you are in a regular room).  You are not required to quarantine at this time prior to your surgery. However, you must do this: Hand Hygiene often Do NOT share personal items Notify your provider if you are in close contact with someone who has COVID or you develop fever 100.4 or greater, new onset of sneezing, cough, sore throat, shortness of breath or body aches.  If you test positive for Covid or have been in contact with anyone that has tested positive in the last 10 days please notify you surgeon.    Your procedure is scheduled on: Friday 07-15-24   Report to [facility] Main Entrance: [address] entrance where the [employer] is available.   Report to admitting at: 09:30  AM  Call this number if you have any questions or problems the morning of surgery [phone]  FOLLOW ANY ADDITIONAL PRE OP INSTRUCTIONS YOU RECEIVED FROM YOUR SURGEON'S OFFICE!!!  Do not eat food after Midnight the night prior to your surgery/procedure.  After Midnight you may have the following liquids until   08:45 AM DAY OF SURGERY  Clear Liquid Diet Water  Black Coffee (sugar ok, NO MILK/CREAM OR CREAMERS)  Tea (sugar ok, NO MILK/CREAM OR CREAMERS) regular and decaf                             Plain Jell-O   with no fruit (NO RED)                                           Fruit ices (not with fruit pulp, NO RED)                                     Popsicles (NO RED)                                                                  Juice: NO CITRUS JUICES: only apple, WHITE grape, WHITE cranberry Sports drinks like Gatorade or Powerade (NO RED)                  Oral Hygiene is also important to reduce your risk of infection.        Remember - BRUSH YOUR TEETH THE MORNING OF SURGERY WITH YOUR REGULAR TOOTHPASTE  Do NOT smoke after Midnight  the night before surgery.  STOP TAKING all Vitamins, Herbs and supplements 1 week before your  surgery.   Warfarin- Stop taking 5 days before surgery  Last dose will be taken on Saturday 07-09-24  Lovenox  120mg  daily at 8am  Rx sent to [facility]  #10 syringes   07/09/24  Last dose of warfarin 07/10/24  No Lovenox  or Warfarin 07/11/24 - 07/13/24  Lovenox  120mg  sq daily at 8am 07/14/24  No Lovenox  12/12  No Lovenox  --------surgery ------warfarin 5mg  pm 12/13  Lovenox  120mg  sq at 80m and warfarin 5mg  pm 12/14 - 12/16  Lovenox  120mg  sq at 28m and warfarin 4mg  pm 12/17  Lovenox  120mg  sq at 93m and INR appt at 9:30am   Take ONLY these medicines the morning of surgery with A SIP OF WATER : Tylenol , Albuterol  & Trelegy Ellipta  Inhalers / DUONEB,  Doxycycline  ?                   You may not have any metal on your body including  jewelry, and body piercing  Do not wear  lotions, powders, cologne, or deodorant  Men may shave face and neck.  Contacts, Hearing Aids, dentures or bridgework may not be worn into surgery. DENTURES WILL BE REMOVED PRIOR TO SURGERY PLEASE DO NOT APPLY Poly grip OR ADHESIVES!!!  Do not bring your home medications to the hospital. The Pharmacy will dispense medications listed on your medication list to you during your admission in the Hospital.  Please read over the following fact sheets you were given: IF YOU HAVE QUESTIONS  ABOUT YOUR PRE-OP INSTRUCTIONS, PLEASE CALL [phone]   [facility] - Preparing for Surgery        Before surgery, you can play an important role.  Because skin is not sterile, your skin needs to be as free of germs as possible.  You can reduce the number of germs on your skin by washing with CHG (chlorahexidine gluconate) soap before surgery.  CHG is an antiseptic cleaner which kills germs and bonds with the skin to continue killing germs even after washing. Please DO NOT use if you have an allergy to CHG or antibacterial soaps.  If your skin becomes reddened/irritated stop using the CHG and inform your nurse when you arrive at Short Stay. Do not shave (including legs and underarms) for at least 48 hours prior to the first CHG shower.  You may shave your face/neck.  Please follow these instructions carefully:  1.  Shower with CHG Soap the night before surgery ONLY (DO NOT USE THE CHG SOAP THE MORNING OF SURGERY).  2.  If you choose to wash your hair, wash your hair first as usual with your normal  shampoo.  3.  After you shampoo, rinse your hair and body thoroughly to remove the shampoo.                             4.  Use CHG as you would any other liquid soap.  You can apply chg directly to the skin and wash.  Gently with a scrungie or clean washcloth.  5.  Apply the CHG Soap to your body ONLY FROM THE NECK DOWN.   Do not use on face/ open                           Wound or open sores. Avoid contact with eyes, ears mouth and  genitals (private parts).                       Wash face,  Genitals (private parts) with your normal soap.             6.  Wash thoroughly, paying special attention to the area where your  surgery  will be performed.  7.  Thoroughly rinse your body with warm water  from the neck down.  8.  DO NOT shower/wash with your normal soap after using and rinsing off the CHG Soap.                9.  Pat yourself dry with a clean towel.            10.  Wear clean pajamas.             11.  Place clean sheets on your bed the night of your first shower and do not  sleep with pets.  Day of Surgery : Do not apply any CHG, lotions/deodorants the morning of surgery.  Please wear clean clothes to the hospital/surgery center.   FAILURE TO FOLLOW THESE INSTRUCTIONS MAY RESULT IN THE CANCELLATION OF YOUR SURGERY  PATIENT SIGNATURE_________________________________  NURSE SIGNATURE__________________________________  ________________________________________________________________________

## 2024-07-05 NOTE — Progress Notes (Signed)
 COVID Vaccine received:  []  No [x]  Yes Date of any COVID positive Test in last 90 days:  PCP - Hilario Terry Wilhelmena Lloyd, FNP  Charmaine Heller, NP  ?? Cardiologist - Dorn Ross, MD, [name], [place] Pulmonology - Ozell America, MD  Chest x-ray - 07-28-2022  2v,   CT Chest 02-22-2024 EKG -  03-03-2024  Epic Stress Test - 12-26- 2023 Myoview  Epic  ECHO - 07-29-2022  Epic Cardiac Cath - 08-01-2021  LHC / CORS by Dr. Verlin CT Coronary Calcium  score: 2641 on 07-24-2021  Epic  Pacemaker / ICD device [x]  No []  Yes   Spinal Cord Stimulator:[x]  No []  Yes       History of Sleep Apnea? []  No [x]  Yes   CPAP used?- []  No []  Yes    Medication on DOS: Tylenol , Albuterol  & Trelegy Ellipta  Inhalers / DUONEB  Doxycycline  ?  Hold DOS:  Patient has: []  NO Hx DM   [x]  Pre-DM   []  DM1  []   DM2 Does the patient monitor blood sugar?   []  N/A   [x]  No []  Yes  Last A1c was:  5.5 on   12-31-23  Blood Thinner / Instructions:Warfarin, Hold x 5 days, Lovenox  bridge per [facility] Giles Bathe, RN)  Lovenox  120mg  daily at 8am  Rx sent to [facility]  #10 syringes   07/09/24  Last dose of warfarin 07/10/24  No Lovenox  or Warfarin 07/11/24 - 07/13/24  Lovenox  120mg  sq daily at 8am 07/14/24  No Lovenox  12/12  No Lovenox  --------surgery ------warfarin 5mg  pm 12/13  Lovenox  120mg  sq at 70m and warfarin 5mg  pm 12/14 - 12/16  Lovenox  120mg  sq at 5m and warfarin 4mg  pm 12/17  Lovenox  120mg  sq at 53m and INR appt at 9:30am  Activity level: Able to walk up 2 flights of stairs without becoming significantly short of breath or having chest pain?  []  No   []    Yes  Patient can perform ADLs without assistance. []  No   []   Yes  Anesthesia review: Factor V Leiden, Hx DVT / PE, CAD, COPD- Emphysema, Hepatic  ,  Thrombocytopenia, Hx ETOH Abuse- Pancreatitis,  GERD, Pre-DM no meds,  Patient denies any S&S of respiratory illness or Covid - no shortness of breath, fever, cough or chest pain at PAT  appointment.  Patient verbalized understanding and agreement to the Pre-Surgical Instructions that were given to them at this PAT appointment. Patient was also educated of the need to review these PAT instructions again prior to his surgery.I reviewed the appropriate phone numbers to call if they have any and questions or concerns.

## 2024-07-06 ENCOUNTER — Encounter (HOSPITAL_COMMUNITY)
Admission: RE | Admit: 2024-07-06 | Discharge: 2024-07-06 | Disposition: A | Source: Ambulatory Visit | Attending: General Surgery

## 2024-07-06 ENCOUNTER — Encounter (HOSPITAL_COMMUNITY): Payer: Self-pay

## 2024-07-06 ENCOUNTER — Other Ambulatory Visit: Payer: Self-pay

## 2024-07-06 VITALS — BP 104/63 | HR 66 | Temp 98.0°F | Resp 16 | Ht 73.0 in | Wt 175.0 lb

## 2024-07-06 DIAGNOSIS — Z01818 Encounter for other preprocedural examination: Secondary | ICD-10-CM

## 2024-07-06 DIAGNOSIS — K760 Fatty (change of) liver, not elsewhere classified: Secondary | ICD-10-CM

## 2024-07-06 DIAGNOSIS — R7989 Other specified abnormal findings of blood chemistry: Secondary | ICD-10-CM

## 2024-07-06 DIAGNOSIS — F1010 Alcohol abuse, uncomplicated: Secondary | ICD-10-CM

## 2024-07-06 HISTORY — DX: Personal history of other diseases of the digestive system: Z87.19

## 2024-07-06 HISTORY — DX: Myoneural disorder, unspecified: G70.9

## 2024-07-06 LAB — COMPREHENSIVE METABOLIC PANEL WITH GFR
ALT: 31 U/L (ref 0–44)
AST: 38 U/L (ref 15–41)
Albumin: 4.2 g/dL (ref 3.5–5.0)
Alkaline Phosphatase: 63 U/L (ref 38–126)
Anion gap: 8 (ref 5–15)
BUN: 19 mg/dL (ref 8–23)
CO2: 28 mmol/L (ref 22–32)
Calcium: 9.8 mg/dL (ref 8.9–10.3)
Chloride: 105 mmol/L (ref 98–111)
Creatinine, Ser: 1 mg/dL (ref 0.61–1.24)
GFR, Estimated: >60 mL/min (ref >60)
Glucose, Bld: 98 mg/dL (ref 70–99)
Potassium: 4.3 mmol/L (ref 3.5–5.1)
Sodium: 141 mmol/L (ref 135–145)
Total Bilirubin: 0.4 mg/dL (ref 0.0–1.2)
Total Protein: 6.9 g/dL (ref 6.5–8.1)

## 2024-07-06 LAB — CBC
HCT: 43.7 % (ref 39.0–52.0)
Hemoglobin: 14.3 g/dL (ref 13.0–17.0)
MCH: 31.2 pg (ref 26.0–34.0)
MCHC: 32.7 g/dL (ref 30.0–36.0)
MCV: 95.2 fL (ref 80.0–100.0)
Platelets: 135 K/uL — ABNORMAL LOW (ref 150–400)
RBC: 4.59 MIL/uL (ref 4.22–5.81)
RDW: 13.2 % (ref 11.5–15.5)
WBC: 6.5 K/uL (ref 4.0–10.5)
nRBC: 0 % (ref 0.0–0.2)

## 2024-07-07 NOTE — Anesthesia Preprocedure Evaluation (Signed)
 Anesthesia Evaluation  Patient identified by MRN, date of birth, ID band Patient awake    Reviewed: Allergy & Precautions, NPO status , Patient's Chart, lab work & pertinent test results  Airway Mallampati: III  TM Distance: <3 FB Neck ROM: Full    Dental no notable dental hx.    Pulmonary shortness of breath, sleep apnea , pneumonia, COPD, former smoker   Pulmonary exam normal breath sounds clear to auscultation       Cardiovascular hypertension, + CAD, + Peripheral Vascular Disease, + DOE and + DVT  Normal cardiovascular exam Rhythm:Regular Rate:Normal  1. Left ventricular ejection fraction, by estimation, is 60 to 65%. The  left ventricle has normal function. The left ventricle has no regional  wall motion abnormalities. There is moderate concentric left ventricular  hypertrophy. Left ventricular  diastolic parameters are consistent with Grade I diastolic dysfunction  (impaired relaxation).   2. Right ventricular systolic function is normal. The right ventricular  size is normal. Tricuspid regurgitation signal is inadequate for assessing  PA pressure.   3. The mitral valve is normal in structure. No evidence of mitral valve  regurgitation. No evidence of mitral stenosis.   4. The aortic valve is tricuspid. Aortic valve regurgitation is not  visualized. No aortic stenosis is present.   5. The inferior vena cava is normal in size with greater than 50%  respiratory variability, suggesting right atrial pressure of 3 mmHg.   Neuro/Psych  PSYCHIATRIC DISORDERS       Neuromuscular disease    GI/Hepatic hiatal hernia,GERD  ,,(+)     substance abuse  alcohol use  Endo/Other  negative endocrine ROS    Renal/GU negative Renal ROS  negative genitourinary   Musculoskeletal  (+) Arthritis ,    Abdominal   Peds negative pediatric ROS (+)  Hematology negative hematology ROS (+)   Anesthesia Other Findings    Reproductive/Obstetrics negative OB ROS                              Anesthesia Physical Anesthesia Plan  ASA: 3  Anesthesia Plan: General   Post-op Pain Management: Tylenol  PO (pre-op)*   Induction: Intravenous  PONV Risk Score and Plan: 2 and Ondansetron , Dexamethasone  and Treatment may vary due to age or medical condition  Airway Management Planned: Oral ETT  Additional Equipment:   Intra-op Plan:   Post-operative Plan: Extubation in OR  Informed Consent: I have reviewed the patients History and Physical, chart, labs and discussed the procedure including the risks, benefits and alternatives for the proposed anesthesia with the patient or authorized representative who has indicated his/her understanding and acceptance.     Dental advisory given  Plan Discussed with: CRNA and Surgeon  Anesthesia Plan Comments: (See PAT note 07/06/24   DISCUSSION:68 y.o. former smoker with h/o GERD, HTN, COPD, OSA, PE, DVT, Factor V Leiden, chronic thrombocytopenia, non-obstructive CAD on cath 2022, umbilical hernia scheduled for above procedure 07/15/2024 with Dr. Cordella Idler.    Per cardiology preoperative evaluation 05/02/2024, Chart reviewed as part of pre-operative protocol coverage. Given past medical history and time since last visit, based on ACC/AHA guidelines, [name] would be at acceptable risk for the planned procedure without further cardiovascular testing.    Patient should contact our office if he is having new symptoms that are concerning from a cardiac perspective to arrange a follow-up appointment.      )  Anesthesia Quick Evaluation

## 2024-07-07 NOTE — Progress Notes (Signed)
 Anesthesia Chart Review   Case: [id number] Date/Time: 07/15/24 1133   Procedure: REPAIR, HERNIA, VENTRAL, ROBOT-ASSISTED - ROBOTIC VENTRAL HERNIA REPAIR WITH MESH   Anesthesia type: General   Pre-op diagnosis: UMBILICAL HERNIA   Location: [facility]OM 05 / [facility]   Surgeons: Polly Cordella LABOR, MD       DISCUSSION:68 y.o. former smoker with h/o GERD, HTN, COPD, OSA, PE, DVT, Factor V Leiden, chronic thrombocytopenia, non-obstructive CAD on cath 2022, umbilical hernia scheduled for above procedure 07/15/2024 with Dr. Cordella Polly.   Per cardiology preoperative evaluation 05/02/2024, Chart reviewed as part of pre-operative protocol coverage. Given past medical history and time since last visit, based on ACC/AHA guidelines, [name] would be at acceptable risk for the planned procedure without further cardiovascular testing.    Patient should contact our office if he is having new symptoms that are concerning from a cardiac perspective to arrange a follow-up appointment.    Per office protocol, patient can hold warfarin for 5 days prior to procedure.    Patient WILL need bridging with Lovenox  (enoxaparin ) around procedure.  Patient should let [name]  clinic know as soon as procedure is scheduled.  Pt advised to hold Warfarin 5 days prior to procedure, instructions for Lovenox  bridge given.  VS: BP 104/63 Comment: right arm sitting  Pulse 66   Temp 36.7 C   Resp 16   Ht 6' 1 (1.854 m)   Wt 79.4 kg   SpO2 98%   BMI 23.09 kg/m   PROVIDERS: Del Wilhelmena Lloyd Sola, FNP is PCP   Cardiologist - Dorn Ross, MD  LABS: Labs reviewed: Acceptable for surgery. (all labs ordered are listed, but only abnormal results are displayed)  Labs Reviewed  CBC - Abnormal; Notable for the following components:      Result Value   Platelets 135 (*)    All other components within normal limits  COMPREHENSIVE METABOLIC PANEL WITH GFR     IMAGES:   EKG:   CV: Echo  07/29/2022 1. Left ventricular ejection fraction, by estimation, is 55 to 60%. The  left ventricle has normal function. The left ventricle has no regional  wall motion abnormalities. There is mild left ventricular hypertrophy.  Left ventricular diastolic parameters  are consistent with Grade I diastolic dysfunction (impaired relaxation).   2. Right ventricular systolic function is normal. The right ventricular  size is normal.   3. The mitral valve is normal in structure. No evidence of mitral valve  regurgitation. No evidence of mitral stenosis.   4. The aortic valve is tricuspid. Aortic valve regurgitation is not  visualized. No aortic stenosis is present.   5. Aortic dilatation noted. There is mild dilatation of the aortic root,  measuring 40 mm. There is mild dilatation of the ascending aorta,  measuring 40 mm.   6. The inferior vena cava is normal in size with greater than 50%  respiratory variability, suggesting right atrial pressure of 3 mmHg.   Cardiac Cath 08/01/2021   Prox RCA to Mid RCA lesion is 25% stenosed.   Ost RCA to Prox RCA lesion is 10% stenosed.   Prox Cx to Mid Cx lesion is 25% stenosed.   Prox LAD lesion is 50% stenosed.   Mid LAD lesion is 30% stenosed.   The left ventricular systolic function is normal.   LV end diastolic pressure is normal.   The left ventricular ejection fraction is 55-65% by visual estimate.   There is no mitral valve  regurgitation.   Moderate, calcified proximal LAD stenosis. RFR 0.97-0.96 suggesting this lesion is not flow limiting. Mild mid LAD stenosis.  Large caliber Circumflex artery with mild mid stenosis Large dominant RCA with mild, calcified proximal and mid vessel stenosis.  Normal LVEDP Normal LV systolic function, LVEF=60%   Recommendations: medical management of non-obstructive CAD. His proximal LAD lesion is not flow limiting by pressure wire analysis (RFR 0.96-0.97). Continue ASA and statin. His dyspnea is likely related  to his chronic lung disease.    Past Medical History:  Diagnosis Date   Arthritis 2015   Bilateral shoulder bursitis    CAD (coronary artery disease)    a. (prior catheterization in 07/2021 showing moderate proximal LAD stenosis but not significant by FFR b.low-risk NST in 07/2022   Chronic back pain    COPD (chronic obstructive pulmonary disease) with emphysema ([name])    DDD (degenerative disc disease), lumbar    hx epideral injection  L5--S1    Diverticulitis    DVT (deep venous thrombosis) ([name]) 11/19/2018   Dysphasia    intermittant w/ food    Dyspnea    Dyspnea on exertion    Emphysema of lung ([name]) 2012   Fatty liver    GERD (gastroesophageal reflux disease) 2015   Gout    L great toe   Heterozygous factor V Leiden mutation    Hip problem 2020   left, seeing orthopedics   History of acute pancreatitis    alcoholic pancreatitis 11-11-2011 and 01-30-2012. 2021   History of colon polyps    10-17-2005  hyperplastic polyp's   History of esophageal stricture    s/p  dilation 02-02-2017   History of hiatal hernia    History of peptic ulcer 1990s   History of pneumococcal pneumonia 2006   bilateral pneumonia w/ left lower lobe abscess treated w/ chest tube with suction for drainage   History of pneumothorax    1984--  left spontaneous pneumothorax ,  treated w/ chest tube   Hypertension    Malignant neoplasm of prostate [facility]) urologist-  dr dahlstedt/  oncologist -- dr patrcia   dx 12-02-2016-- Stage T2b, Gleason 3+4,  PSA 5.99,  vol 25cc   Neuromuscular disorder ([name])    neuropathy in feet   Neuropathy    Numbness of left foot    outer left ankle numb-- per pt has appt. w/ neurologist   OSA (obstructive sleep apnea)    per pt has not used cpap over year ago from 04-16-2017--- per study 01-17-2010  severe osa   Pneumonia    Sleep apnea 2013   Solid nodule of lung greater than 8 mm in diameter 05/11/2018   03/2018: RUL Nodule, 10mm   Wears glasses     Past Surgical  History:  Procedure Laterality Date   APPENDECTOMY  July 2023   BIOPSY  02/02/2017   Procedure: BIOPSY;  Surgeon: Shaaron Lamar HERO, MD;  [facility];  Service: Endoscopy;;  duodenal bx's, esophgeal bx's   BIOPSY  07/30/2017   Procedure: BIOPSY;  Surgeon: Shaaron Lamar HERO, MD;  [facility];  Service: Endoscopy;;  esophagus   BIOPSY  02/15/2021   Procedure: BIOPSY;  Surgeon: Shaaron Lamar HERO, MD;  [facility];  Service: Endoscopy;;   CARDIOVASCULAR STRESS TEST  09/12/2009   normal nuclear perfusion study w/ no ischemia /  normal LV function and wall motion , ef 57%   COLON SURGERY  July 2023   Colectomy  COLONOSCOPY  2007   Dr. Shaaron: hyperplastic polyps, internal hemorrhoids    COLONOSCOPY WITH PROPOFOL  N/A 05/03/2018   Dr. Shaaron: diverticulosis in entire colon, multiple polyps in sigmoid, at splenic flexure, and ascending colon, not all polyps removed. Tubular adenomas and inflammatory polyps. Needs 3 year surveilance   COLONOSCOPY WITH PROPOFOL  N/A 02/15/2021   Many polyps without adenomatous changes but all inflammatory in nature with mildly active colitis and chronic inflammatory mucosa.  Repeat in 5 years.   CORONARY PRESSURE/FFR STUDY N/A 08/01/2021   Procedure: INTRAVASCULAR PRESSURE WIRE/FFR STUDY;  Surgeon: Verlin Lonni BIRCH, MD;  Location: [facility];  Service: Cardiovascular;  Laterality: N/A;   EPIDURAL BLOCK INJECTION  2020   ESOPHAGOGASTRODUODENOSCOPY (EGD) WITH PROPOFOL  N/A 02/02/2017   Dr. Shaaron: esophageal stenosis s/p dilation and biopsy, medium sized hiatal hernia, normal duodenum   ESOPHAGOGASTRODUODENOSCOPY (EGD) WITH PROPOFOL  N/A 07/30/2017   Dr. Shaaron: esophageal stenosis s/p dilation and biopsy, medium-sized hiatal hernia, normal duodenum   HEMORRHOID SURGERY  01-21-2008    [facility]   HIP ARTHROPLASTY Bilateral    IR RADIOLOGIST EVAL & MGMT  08/29/2020   JOINT REPLACEMENT  Hip joint 2020/2021   LEFT HEART CATH AND  CORONARY ANGIOGRAPHY N/A 08/01/2021   Procedure: LEFT HEART CATH AND CORONARY ANGIOGRAPHY;  Surgeon: Verlin Lonni BIRCH, MD;  Location: [facility];  Service: Cardiovascular;  Laterality: N/A;   MALONEY DILATION N/A 02/02/2017   Procedure: AGAPITO DILATION;  Surgeon: Shaaron Lamar HERO, MD;  [facility];  Service: Endoscopy;  Laterality: N/A;   MALONEY DILATION N/A 07/30/2017   Procedure: AGAPITO DILATION;  Surgeon: Shaaron Lamar HERO, MD;  [facility];  Service: Endoscopy;  Laterality: N/A;   POLYPECTOMY  05/03/2018   Procedure: POLYPECTOMY;  Surgeon: Shaaron Lamar HERO, MD;  [facility];  Service: Endoscopy;;  ascending colon polyp, sigmoid colon polyps (multiple)   POLYPECTOMY  02/15/2021   Procedure: POLYPECTOMY;  Surgeon: Shaaron Lamar HERO, MD;  [facility];  Service: Endoscopy;;   RADIOACTIVE SEED IMPLANT N/A 04/23/2017   Procedure: RADIOACTIVE SEED IMPLANT/BRACHYTHERAPY IMPLANT;  Surgeon: Matilda Senior, MD;  [facility];  Service: Urology;  Laterality: N/A;   SHOULDER ARTHROSCOPY  08/05/2003   SPACE OAR INSTILLATION N/A 04/23/2017   Procedure: SPACE OAR INSTILLATION;  Surgeon: Matilda Senior, MD;  [facility];  Service: Urology;  Laterality: N/A;   TRANSTHORACIC ECHOCARDIOGRAM  03/28/2009   mild LVH, ef 55-60%/ mild aorta calcification   VASECTOMY  08/05/1983   VIDEO ASSISTED THORACOSCOPY (VATS)/DECORTICATION Left     MEDICATIONS:  acetaminophen  (TYLENOL ) 500 MG tablet   albuterol  (VENTOLIN  HFA) 108 (90 Base) MCG/ACT inhaler   Cholecalciferol (VITAMIN D3) 25 MCG (1000 UT) CAPS   CITRUS BERGAMOT PO   doxycycline  (VIBRA -TABS) 100 MG tablet   enoxaparin  (LOVENOX ) 120 MG/0.8ML injection   Fluticasone -Umeclidin-Vilant (TRELEGY ELLIPTA ) 100-62.5-25 MCG/ACT AEPB   ipratropium-albuterol  (DUONEB) 0.5-2.5 (3) MG/3ML SOLN   minocycline (MINOCIN) 100 MG capsule   nitroGLYCERIN   (NITROSTAT ) 0.4 MG SL tablet   NON FORMULARY   Omega 3-6-9 Fatty Acids (OMEGA 3-6-9 COMPLEX PO)   OVER THE COUNTER MEDICATION   psyllium (METAMUCIL) 58.6 % powder   warfarin (COUMADIN ) 2 MG tablet   No current facility-administered medications for this encounter.    Harlene Hoots Ward, PA-C [facility] [phone]

## 2024-07-11 ENCOUNTER — Telehealth: Payer: Self-pay | Admitting: *Deleted

## 2024-07-11 ENCOUNTER — Telehealth: Payer: Self-pay | Admitting: Internal Medicine

## 2024-07-11 NOTE — Telephone Encounter (Signed)
 Received call from pt regarding Lovenox  injections.  Pt is to start Lovenox  bridge today taking 120mg  once daily.  Lovenox  was sent to pharmacy to take twice daily in error.  Confirmed with pt he is to take Lovenox  120mg  daily at 8am.  He verbalized understanding.

## 2024-07-11 NOTE — Telephone Encounter (Signed)
 Patient called regarding questions on taking Lovanox shot.

## 2024-07-11 NOTE — Telephone Encounter (Signed)
 See new phone note regarding lovenox .

## 2024-07-15 ENCOUNTER — Other Ambulatory Visit (HOSPITAL_COMMUNITY): Payer: Self-pay

## 2024-07-15 ENCOUNTER — Encounter (HOSPITAL_COMMUNITY): Admission: RE | Disposition: A | Payer: Self-pay | Source: Ambulatory Visit | Attending: General Surgery

## 2024-07-15 ENCOUNTER — Encounter (HOSPITAL_COMMUNITY): Payer: Self-pay | Admitting: Physician Assistant

## 2024-07-15 ENCOUNTER — Encounter (HOSPITAL_COMMUNITY): Payer: Self-pay | Admitting: General Surgery

## 2024-07-15 ENCOUNTER — Ambulatory Visit (HOSPITAL_COMMUNITY): Admitting: Anesthesiology

## 2024-07-15 ENCOUNTER — Ambulatory Visit (HOSPITAL_COMMUNITY)
Admission: RE | Admit: 2024-07-15 | Discharge: 2024-07-15 | Disposition: A | Discharge status: Home / Self Care | Source: Ambulatory Visit | Attending: General Surgery | Admitting: General Surgery

## 2024-07-15 DIAGNOSIS — K429 Umbilical hernia without obstruction or gangrene: Secondary | ICD-10-CM | POA: Diagnosis present

## 2024-07-15 DIAGNOSIS — K219 Gastro-esophageal reflux disease without esophagitis: Secondary | ICD-10-CM | POA: Diagnosis not present

## 2024-07-15 DIAGNOSIS — K439 Ventral hernia without obstruction or gangrene: Secondary | ICD-10-CM | POA: Insufficient documentation

## 2024-07-15 DIAGNOSIS — Z87891 Personal history of nicotine dependence: Secondary | ICD-10-CM | POA: Diagnosis not present

## 2024-07-15 DIAGNOSIS — I2510 Atherosclerotic heart disease of native coronary artery without angina pectoris: Secondary | ICD-10-CM | POA: Insufficient documentation

## 2024-07-15 DIAGNOSIS — J439 Emphysema, unspecified: Secondary | ICD-10-CM | POA: Insufficient documentation

## 2024-07-15 DIAGNOSIS — G4733 Obstructive sleep apnea (adult) (pediatric): Secondary | ICD-10-CM | POA: Diagnosis not present

## 2024-07-15 DIAGNOSIS — K449 Diaphragmatic hernia without obstruction or gangrene: Secondary | ICD-10-CM | POA: Insufficient documentation

## 2024-07-15 DIAGNOSIS — Z86718 Personal history of other venous thrombosis and embolism: Secondary | ICD-10-CM | POA: Diagnosis not present

## 2024-07-15 DIAGNOSIS — I10 Essential (primary) hypertension: Secondary | ICD-10-CM | POA: Diagnosis not present

## 2024-07-15 DIAGNOSIS — Z7901 Long term (current) use of anticoagulants: Secondary | ICD-10-CM | POA: Diagnosis not present

## 2024-07-15 SURGERY — REPAIR, HERNIA, VENTRAL, ROBOT-ASSISTED
Anesthesia: General | Site: Abdomen

## 2024-07-15 MED ORDER — ACETAMINOPHEN 500 MG PO TABS
1000.0000 mg | ORAL_TABLET | ORAL | Status: AC
  Administered 2024-07-15: 1000 mg via ORAL
  Filled 2024-07-15: qty 2

## 2024-07-15 MED ORDER — MIDAZOLAM HCL 5 MG/5ML IJ SOLN
INTRAMUSCULAR | Status: DC | PRN
  Administered 2024-07-15: 1 mg via INTRAVENOUS

## 2024-07-15 MED ORDER — OXYCODONE HCL 5 MG PO TABS
5.0000 mg | ORAL_TABLET | Freq: Three times a day (TID) | ORAL | 0 refills | Status: AC | PRN
  Filled 2024-07-15: qty 12, 4d supply, fill #0

## 2024-07-15 MED ORDER — FENTANYL CITRATE (PF) 100 MCG/2ML IJ SOLN
INTRAMUSCULAR | Status: DC | PRN
  Administered 2024-07-15: 50 ug via INTRAVENOUS
  Administered 2024-07-15: 100 ug via INTRAVENOUS
  Administered 2024-07-15 (×2): 50 ug via INTRAVENOUS

## 2024-07-15 MED ORDER — LIDOCAINE HCL (PF) 2 % IJ SOLN
INTRAMUSCULAR | Status: AC
  Filled 2024-07-15: qty 5

## 2024-07-15 MED ORDER — ONDANSETRON HCL 4 MG/2ML IJ SOLN
INTRAMUSCULAR | Status: AC
  Filled 2024-07-15: qty 2

## 2024-07-15 MED ORDER — DEXAMETHASONE SOD PHOSPHATE PF 10 MG/ML IJ SOLN
INTRAMUSCULAR | Status: DC | PRN
  Administered 2024-07-15: 5 mg via INTRAVENOUS

## 2024-07-15 MED ORDER — ROCURONIUM BROMIDE 10 MG/ML (PF) SYRINGE
PREFILLED_SYRINGE | INTRAVENOUS | Status: AC
  Filled 2024-07-15: qty 10

## 2024-07-15 MED ORDER — EPHEDRINE SULFATE (PRESSORS) 25 MG/5ML IV SOSY
PREFILLED_SYRINGE | INTRAVENOUS | Status: DC | PRN
  Administered 2024-07-15: 5 mg via INTRAVENOUS
  Administered 2024-07-15: 10 mg via INTRAVENOUS
  Administered 2024-07-15 (×2): 5 mg via INTRAVENOUS

## 2024-07-15 MED ORDER — LACTATED RINGERS IV SOLN: INTRAVENOUS | Status: DC

## 2024-07-15 MED ORDER — OXYCODONE HCL 5 MG/5ML PO SOLN: 5.0000 mg | Freq: Once | ORAL | Status: AC | PRN

## 2024-07-15 MED ORDER — PROPOFOL 10 MG/ML IV BOLUS
INTRAVENOUS | Status: DC | PRN
  Administered 2024-07-15: 150 mg via INTRAVENOUS

## 2024-07-15 MED ORDER — BUPIVACAINE-EPINEPHRINE 0.25% -1:200000 IJ SOLN
INTRAMUSCULAR | Status: DC | PRN
  Administered 2024-07-15: 10 mL
  Administered 2024-07-15: 20 mL

## 2024-07-15 MED ORDER — CHLORHEXIDINE GLUCONATE CLOTH 2 % EX PADS: 6.0000 | MEDICATED_PAD | Freq: Once | CUTANEOUS | Status: DC

## 2024-07-15 MED ORDER — MEPERIDINE HCL 25 MG/ML IJ SOLN: 6.2500 mg | INTRAMUSCULAR | Status: DC | PRN

## 2024-07-15 MED ORDER — FENTANYL CITRATE (PF) 50 MCG/ML IJ SOSY
25.0000 ug | PREFILLED_SYRINGE | INTRAMUSCULAR | Status: DC | PRN
  Administered 2024-07-15: 50 ug via INTRAVENOUS

## 2024-07-15 MED ORDER — FENTANYL CITRATE (PF) 250 MCG/5ML IJ SOLN
INTRAMUSCULAR | Status: AC
  Filled 2024-07-15: qty 5

## 2024-07-15 MED ORDER — ONDANSETRON HCL 4 MG/2ML IJ SOLN
INTRAMUSCULAR | Status: DC | PRN
  Administered 2024-07-15: 4 mg via INTRAVENOUS

## 2024-07-15 MED ORDER — SUGAMMADEX SODIUM 200 MG/2ML IV SOLN
INTRAVENOUS | Status: AC
  Filled 2024-07-15: qty 2

## 2024-07-15 MED ORDER — LIDOCAINE HCL (CARDIAC) PF 100 MG/5ML IV SOSY
PREFILLED_SYRINGE | INTRAVENOUS | Status: DC | PRN
  Administered 2024-07-15: 100 mg via INTRAVENOUS

## 2024-07-15 MED ORDER — MIDAZOLAM HCL 2 MG/2ML IJ SOLN
INTRAMUSCULAR | Status: AC
  Filled 2024-07-15: qty 2

## 2024-07-15 MED ORDER — IBUPROFEN 200 MG PO TABS
600.0000 mg | ORAL_TABLET | Freq: Four times a day (QID) | ORAL | 0 refills | Status: AC
  Filled 2024-07-15: qty 75, 7d supply, fill #0

## 2024-07-15 MED ORDER — ORAL CARE MOUTH RINSE: 15.0000 mL | Freq: Once | OROMUCOSAL | Status: AC

## 2024-07-15 MED ORDER — GABAPENTIN 300 MG PO CAPS
300.0000 mg | ORAL_CAPSULE | ORAL | Status: AC
  Administered 2024-07-15: 300 mg via ORAL
  Filled 2024-07-15: qty 1

## 2024-07-15 MED ORDER — SUGAMMADEX SODIUM 200 MG/2ML IV SOLN
INTRAVENOUS | Status: DC | PRN
  Administered 2024-07-15: 200 mg via INTRAVENOUS

## 2024-07-15 MED ORDER — CEFAZOLIN SODIUM-DEXTROSE 2-4 GM/100ML-% IV SOLN
2.0000 g | INTRAVENOUS | Status: AC
  Administered 2024-07-15: 2 g via INTRAVENOUS
  Filled 2024-07-15: qty 100

## 2024-07-15 MED ORDER — CHLORHEXIDINE GLUCONATE 0.12 % MT SOLN
15.0000 mL | Freq: Once | OROMUCOSAL | Status: AC
  Administered 2024-07-15: 15 mL via OROMUCOSAL

## 2024-07-15 MED ORDER — OXYCODONE HCL 5 MG PO TABS
5.0000 mg | ORAL_TABLET | Freq: Once | ORAL | Status: AC | PRN
  Administered 2024-07-15: 5 mg via ORAL

## 2024-07-15 MED ORDER — OXYCODONE HCL 5 MG PO TABS
ORAL_TABLET | ORAL | Status: AC
  Filled 2024-07-15: qty 1

## 2024-07-15 MED ORDER — ACETAMINOPHEN 325 MG PO TABS
650.0000 mg | ORAL_TABLET | Freq: Four times a day (QID) | ORAL | 0 refills | Status: AC
  Filled 2024-07-15: qty 50, 7d supply, fill #0

## 2024-07-15 MED ORDER — ONDANSETRON HCL 4 MG/2ML IJ SOLN: 4.0000 mg | Freq: Once | INTRAMUSCULAR | Status: DC | PRN

## 2024-07-15 MED ORDER — GLYCOPYRROLATE 0.2 MG/ML IJ SOLN
INTRAMUSCULAR | Status: DC | PRN
  Administered 2024-07-15: .1 mg via INTRAVENOUS

## 2024-07-15 MED ORDER — PROPOFOL 10 MG/ML IV BOLUS
INTRAVENOUS | Status: AC
  Filled 2024-07-15: qty 20

## 2024-07-15 MED ORDER — FENTANYL CITRATE (PF) 50 MCG/ML IJ SOSY
PREFILLED_SYRINGE | INTRAMUSCULAR | Status: AC
  Filled 2024-07-15: qty 1

## 2024-07-15 MED ADMIN — Rocuronium Bromide IV Soln 100 MG/10ML (10 MG/ML): 60 mg | INTRAVENOUS | NDC 99999070048

## 2024-07-15 MED ADMIN — Rocuronium Bromide IV Soln 100 MG/10ML (10 MG/ML): 20 mg | INTRAVENOUS | NDC 99999070048

## 2024-07-15 MED ADMIN — Sodium Chloride Irrigation Soln 0.9%: 1000 mL | NDC 99999050048

## 2024-07-15 SURGICAL SUPPLY — 40 items
APPLICATOR COTTON TIP 6 STRL (MISCELLANEOUS) IMPLANT
BAG COUNTER SPONGE SURGICOUNT (BAG) IMPLANT
BLADE SURG SZ11 CARB STEEL (BLADE) ×1 IMPLANT
CHLORAPREP W/TINT 26 (MISCELLANEOUS) ×1 IMPLANT
COVER SURGICAL LIGHT HANDLE (MISCELLANEOUS) ×1 IMPLANT
COVER TIP SHEARS 8 DVNC (MISCELLANEOUS) ×1 IMPLANT
DERMABOND ADVANCED .7 DNX12 (GAUZE/BANDAGES/DRESSINGS) ×1 IMPLANT
DRAPE ARM DVNC X/XI (DISPOSABLE) ×4 IMPLANT
DRAPE COLUMN DVNC XI (DISPOSABLE) ×1 IMPLANT
DRIVER NDL LRG 8 DVNC XI (INSTRUMENTS) IMPLANT
DRIVER NDL MEGA SUTCUT DVNCXI (INSTRUMENTS) ×1 IMPLANT
ELECT PENCIL ROCKER SW 15FT (MISCELLANEOUS) ×1 IMPLANT
ELECT REM PT RETURN 15FT ADLT (MISCELLANEOUS) ×1 IMPLANT
GAUZE 4X4 16PLY ~~LOC~~+RFID DBL (SPONGE) ×1 IMPLANT
GLOVE BIO SURGEON STRL SZ7.5 (GLOVE) ×2 IMPLANT
GOWN STRL REUS W/ TWL XL LVL3 (GOWN DISPOSABLE) ×2 IMPLANT
IRRIGATION SUCT STRKRFLW 2 WTP (MISCELLANEOUS) IMPLANT
KIT BASIN OR (CUSTOM PROCEDURE TRAY) ×1 IMPLANT
KIT TURNOVER KIT A (KITS) ×1 IMPLANT
MARKER SKIN DUAL TIP RULER LAB (MISCELLANEOUS) ×1 IMPLANT
MESH PROGRIP LAP SLF FIX 16X12 (Mesh General) IMPLANT
NDL HYPO 22X1.5 SAFETY MO (MISCELLANEOUS) ×1 IMPLANT
NDL INSUFFLATION 14GA 120MM (NEEDLE) ×1 IMPLANT
OBTURATOR OPTICALSTD 8 DVNC (TROCAR) ×1 IMPLANT
PACK CARDIOVASCULAR III (CUSTOM PROCEDURE TRAY) ×1 IMPLANT
SCISSORS MNPLR CVD DVNC XI (INSTRUMENTS) ×1 IMPLANT
SEAL UNIV 5-12 XI (MISCELLANEOUS) ×4 IMPLANT
SOLUTION ANTFG W/FOAM PAD STRL (MISCELLANEOUS) ×1 IMPLANT
SOLUTION ELECTROSURG ANTI STCK (MISCELLANEOUS) ×1 IMPLANT
SPIKE FLUID TRANSFER (MISCELLANEOUS) ×1 IMPLANT
SUT MNCRL AB 4-0 PS2 18 (SUTURE) ×1 IMPLANT
SUT STRATA PDS 2-0 23 CT-1 (SUTURE) IMPLANT
SUT STRATAFIX SPIRAL PDS3-0 (SUTURE) IMPLANT
SUTURE STRATFX 0 PDS+ CT-2 23 (SUTURE) ×2 IMPLANT
SYR 10ML LL (SYRINGE) ×1 IMPLANT
SYR 20ML LL LF (SYRINGE) ×1 IMPLANT
TOWEL GREEN STERILE FF (TOWEL DISPOSABLE) ×1 IMPLANT
TOWEL OR 17X26 10 PK STRL BLUE (TOWEL DISPOSABLE) ×1 IMPLANT
TROCAR Z-THREAD FIOS 5X100MM (TROCAR) ×1 IMPLANT
TUBING INSUFFLATION 10FT LAP (TUBING) ×1 IMPLANT

## 2024-07-15 NOTE — Anesthesia Postprocedure Evaluation (Signed)
 Anesthesia Post Note  Patient: [name]  Procedure(s) Performed: REPAIR, HERNIA, VENTRAL, ROBOT-ASSISTED (Abdomen)     Patient location during evaluation: PACU Anesthesia Type: General Level of consciousness: awake and alert Pain management: pain level controlled Vital Signs Assessment: post-procedure vital signs reviewed and stable Respiratory status: spontaneous breathing, nonlabored ventilation, respiratory function stable and patient connected to nasal cannula oxygen Cardiovascular status: blood pressure returned to baseline and stable Postop Assessment: no apparent nausea or vomiting Anesthetic complications: no   There were no known notable events for this encounter.  Last Vitals:  Vitals:   07/15/24 0947 07/15/24 1345  BP: 132/79 (!) 155/95  Pulse: 60 71  Resp: 16 18  Temp: 36.7 C (!) 35.9 C  SpO2: 98% 100%    Last Pain:  Vitals:   07/15/24 1345  TempSrc:   PainSc: 5                  Angenette Daily

## 2024-07-15 NOTE — Op Note (Signed)
 [name] ([id number])  Operative Report  Date 07/15/2024   PREOPERATIVE DIAGNOSIS: Umbilical hernia  POSTOPERATIVE DIAGNOSIS: Umbilical hernia (3.5 x 3cm) and Epigastric hernia (1cm x 1cm). Total area of fascia involved: 3.5cm x 7cm  PROCEDURE:  Robotic Transabdominal Preperitoneal Ventral Hernia Repair with Mesh (12 x 16cm Progrip Mesh)  HERNIA CHARACTERISTICS: Location: umbilical, epigastric Approach: Robotic Initial/Recurrent: initial  Reducible/Incarcerated: reducible with some pressure Mesh Implantation: Yes - 16 x 12cm Medtronic ProGrip Mesh Hernia Size: Total area 3.5cm x 7cm  IMPLANTS: Implant Name Type Inv. Item Serial No. Manufacturer Lot No. LRB No. Used Action  MESH PROGRIP LAP SLF FIX 16X12 - [id number] Mesh General MESH PROGRIP LAP SLF FIX 16X12  COVIDIEN [id number] N/A 1 Implanted    SURGEON: Cordella Idler, MD  ANESTHESIA: General Anesthesia    INTRAOPERATIVE FINDINGS: Umbilical hernia containing fat and small epigastric hernia 4cm from the umbilical hernia also containing fat  ESTIMATED BLOOD LOSS: Minimal   COMPLICATIONS: None  SPECIMENS: None  OPERATIVE INDICATIONS: Pt is a 68 y.o. male who presents with a ventral hernia.  The patient desires definitive repair.  The risks, benefits and alternatives of the procedure were explained to the patient.  Risks including the risks of bleeding, infection, need for mesh removal, and potential of hernia recurrence were discussed.  The patient agreed to proceed and signed informed consent in front of a witness.   DESCRIPTION OF PROCEDURE: After preoperative identifying the patient in holding, the patient was brought to the operating room and placed supine on the operating table. SCDs were placed.  After induction of general anesthesia, the patient was given the appropriate perioperative antibiotics.  Both arms were tucked and padded to avoid potential nerve injury.  The abdomen was prepped and draped in a typical  sterile fashion.  A JACHO approved time out, where the name of the patient, the operation, and intended site was confirmed.  The procedure was begun.  An incision was made in the left upper quadrant and the Veress needle was introduced into the abdomen under standard drop technique.  The abdomen was insufflated with air.  An 8 mm left upper quadrant optical trocar was placed under direct visualization and the abdomen was inspected.  No evidence of injury was evident at the trocar site or secondary to the Veress needle.  Two additional 8 mm ports were placed in the left abdomen.  The daVinci robot was brought into the field and docked.  The hernia was inspected.  The hernia was noted to contain fat.  The peritoneum at the falciform ligament was opened with sharp dissection and electrocautery.  The space was developed to accommodate reduction of the hernia as well as adequate space to place our mesh. In creating the flap I identified a second hernia approximately 4cm cephalad to the umbilical hernia. The umbilical hernia measured 3.5 x 3cm, the epigastric hernia was 1cm x 1cm, and the total area of involved fascia was 3.5 x 7cm. Attention was turned to the hernia.  Adhesions to the hernia were taken down sharply and with electrocautery.  After defining our defect, the defect was closed primarily with a running 0 Stratafix Symmetric Suture.  Next, a 12 cm x 16 cm piece of Medtronic ProGrip mesh was introduced in the abdomen.  The mesh was aligned appropriately with the defect to provide adequate coverage in all directions.  There was good coverage of the defect at completion.  Attention was then turned to closure of the peritoneal flap.  This was closed with a running 3-0 Stratafix Spiral Suture.  The abdomen was thoroughly inspected a final time and hemostasis was assured.  Any suture needles were removed from the body.  The robot was undocked from the patient.  The remaining ports were removed and the abdomen was  desulfated.  The port sites were closed, local anesthetic was infiltrated, and Dermabond applied.  After confirming twice that the sponge, needle, and instrument counts were correct, the procedure was terminated, the patient was extubated and transferred to the recovery room in stable condition.  DISPOSITION: Stable to PACU.   Electronically Signed By: Cordella DELENA Idler 07/15/2024 1:10 PM

## 2024-07-15 NOTE — H&P (Signed)
 [name] 1956-05-11  [id number].    HPI:  68 y/o M with an umbilical hernia who presents for elective repair. He reports that he is in his usual state of health and denies any recent changes in medication. He has been holding his coumadin  and has bridged with Lovenox .   ROS: Review of Systems  Constitutional: Negative.   HENT: Negative.    Eyes: Negative.   Respiratory: Negative.    Cardiovascular: Negative.   Gastrointestinal: Negative.   Genitourinary: Negative.   Musculoskeletal: Negative.   Skin: Negative.   Neurological: Negative.   Endo/Heme/Allergies: Negative.   Psychiatric/Behavioral: Negative.      Family History  Problem Relation Age of Onset   Diabetes Mother    Heart disease Father    Heart disease Brother    Heart disease Brother    Cancer Maternal Uncle    Colon cancer Neg Hx    Colon polyps Neg Hx    Neuropathy Neg Hx     Past Medical History:  Diagnosis Date   Arthritis 2015   Bilateral shoulder bursitis    CAD (coronary artery disease)    a. (prior catheterization in 07/2021 showing moderate proximal LAD stenosis but not significant by FFR b.low-risk NST in 07/2022   Chronic back pain    COPD (chronic obstructive pulmonary disease) with emphysema ([name])    DDD (degenerative disc disease), lumbar    hx epideral injection  L5--S1    Diverticulitis    DVT (deep venous thrombosis) ([name]) 11/19/2018   Dysphasia    intermittant w/ food    Dyspnea    Dyspnea on exertion    Emphysema of lung ([name]) 2012   Fatty liver    GERD (gastroesophageal reflux disease) 2015   Gout    L great toe   Heterozygous factor V Leiden mutation    Hip problem 2020   left, seeing orthopedics   History of acute pancreatitis    alcoholic pancreatitis 11-11-2011 and 01-30-2012. 2021   History of colon polyps    10-17-2005  hyperplastic polyp's   History of esophageal stricture    s/p  dilation 02-02-2017   History of hiatal hernia    History of peptic  ulcer 1990s   History of pneumococcal pneumonia 2006   bilateral pneumonia w/ left lower lobe abscess treated w/ chest tube with suction for drainage   History of pneumothorax    1984--  left spontaneous pneumothorax ,  treated w/ chest tube   Hypertension    Malignant neoplasm of prostate [facility]) urologist-  dr dahlstedt/  oncologist -- dr patrcia   dx 12-02-2016-- Stage T2b, Gleason 3+4,  PSA 5.99,  vol 25cc   Neuromuscular disorder ([name])    neuropathy in feet   Neuropathy    Numbness of left foot    outer left ankle numb-- per pt has appt. w/ neurologist   OSA (obstructive sleep apnea)    per pt has not used cpap over year ago from 04-16-2017--- per study 01-17-2010  severe osa   Pneumonia    Sleep apnea 2013   Solid nodule of lung greater than 8 mm in diameter 05/11/2018   03/2018: RUL Nodule, 10mm   Wears glasses     Past Surgical History:  Procedure Laterality Date   APPENDECTOMY  July 2023   BIOPSY  02/02/2017   Procedure: BIOPSY;  Surgeon: Shaaron Lamar HERO, MD;  [facility]: [facility];  Service: Endoscopy;;  duodenal bx's, esophgeal bx's  BIOPSY  07/30/2017   Procedure: BIOPSY;  Surgeon: Shaaron Lamar HERO, MD;  [facility]: [facility];  Service: Endoscopy;;  esophagus   BIOPSY  02/15/2021   Procedure: BIOPSY;  Surgeon: Shaaron Lamar HERO, MD;  [facility]: [facility];  Service: Endoscopy;;   CARDIOVASCULAR STRESS TEST  09/12/2009   normal nuclear perfusion study w/ no ischemia /  normal LV function and wall motion , ef 57%   COLON SURGERY  July 2023   Colectomy   COLONOSCOPY  2007   Dr. Shaaron: hyperplastic polyps, internal hemorrhoids    COLONOSCOPY WITH PROPOFOL  N/A 05/03/2018   Dr. Shaaron: diverticulosis in entire colon, multiple polyps in sigmoid, at splenic flexure, and ascending colon, not all polyps removed. Tubular adenomas and inflammatory polyps. Needs 3 year surveilance   COLONOSCOPY WITH PROPOFOL  N/A 02/15/2021   Many polyps without adenomatous changes but all  inflammatory in nature with mildly active colitis and chronic inflammatory mucosa.  Repeat in 5 years.   CORONARY PRESSURE/FFR STUDY N/A 08/01/2021   Procedure: INTRAVASCULAR PRESSURE WIRE/FFR STUDY;  Surgeon: Verlin Lonni BIRCH, MD;  Location: [facility];  Service: Cardiovascular;  Laterality: N/A;   EPIDURAL BLOCK INJECTION  2020   ESOPHAGOGASTRODUODENOSCOPY (EGD) WITH PROPOFOL  N/A 02/02/2017   Dr. Shaaron: esophageal stenosis s/p dilation and biopsy, medium sized hiatal hernia, normal duodenum   ESOPHAGOGASTRODUODENOSCOPY (EGD) WITH PROPOFOL  N/A 07/30/2017   Dr. Shaaron: esophageal stenosis s/p dilation and biopsy, medium-sized hiatal hernia, normal duodenum   HEMORRHOID SURGERY  01-21-2008    [facility]   HIP ARTHROPLASTY Bilateral    IR RADIOLOGIST EVAL & MGMT  08/29/2020   JOINT REPLACEMENT  Hip joint 2020/2021   LEFT HEART CATH AND CORONARY ANGIOGRAPHY N/A 08/01/2021   Procedure: LEFT HEART CATH AND CORONARY ANGIOGRAPHY;  Surgeon: Verlin Lonni BIRCH, MD;  Location: [facility];  Service: Cardiovascular;  Laterality: N/A;   MALONEY DILATION N/A 02/02/2017   Procedure: AGAPITO DILATION;  Surgeon: Shaaron Lamar HERO, MD;  [facility];  Service: Endoscopy;  Laterality: N/A;   MALONEY DILATION N/A 07/30/2017   Procedure: AGAPITO DILATION;  Surgeon: Shaaron Lamar HERO, MD;  [facility]: [facility];  Service: Endoscopy;  Laterality: N/A;   POLYPECTOMY  05/03/2018   Procedure: POLYPECTOMY;  Surgeon: Shaaron Lamar HERO, MD;  [facility];  Service: Endoscopy;;  ascending colon polyp, sigmoid colon polyps (multiple)   POLYPECTOMY  02/15/2021   Procedure: POLYPECTOMY;  Surgeon: Shaaron Lamar HERO, MD;  [facility];  Service: Endoscopy;;   RADIOACTIVE SEED IMPLANT N/A 04/23/2017   Procedure: RADIOACTIVE SEED IMPLANT/BRACHYTHERAPY IMPLANT;  Surgeon: Matilda Senior, MD;  Location: [facility];  Service: Urology;  Laterality: N/A;    SHOULDER ARTHROSCOPY  08/05/2003   SPACE OAR INSTILLATION N/A 04/23/2017   Procedure: SPACE OAR INSTILLATION;  Surgeon: Matilda Senior, MD;  Location: [facility];  Service: Urology;  Laterality: N/A;   TRANSTHORACIC ECHOCARDIOGRAM  03/28/2009   mild LVH, ef 55-60%/ mild aorta calcification   VASECTOMY  08/05/1983   VIDEO ASSISTED THORACOSCOPY (VATS)/DECORTICATION Left     Social History:  reports that he quit smoking about 8 years ago. His smoking use included cigarettes and e-cigarettes. He started smoking about 38 years ago. He has a 30 pack-year smoking history. He has been exposed to tobacco smoke. He has never used smokeless tobacco. He reports that he does not currently use alcohol. He reports that he does not use drugs.  Allergies: Allergies[1]  Medications Prior to Admission  Medication Sig Dispense Refill   acetaminophen  (TYLENOL ) 500 MG tablet Take 2 tablets (1,000 mg total) by mouth every 6 (six) hours as needed for mild pain. 30 tablet 0   albuterol  (VENTOLIN  HFA) 108 (90 Base) MCG/ACT inhaler Inhale 2 puffs into the lungs every 6 (six) hours as needed for wheezing or shortness of breath.     Cholecalciferol (VITAMIN D3) 25 MCG (1000 UT) CAPS Take 1 capsule (1,000 Units total) by mouth daily. (Patient taking differently: Take 2,000 Units by mouth daily.) 90 capsule 1   CITRUS BERGAMOT PO Take 1 capsule by mouth daily.     doxycycline  (VIBRA -TABS) 100 MG tablet Take 100 mg by mouth 2 (two) times daily.     Fluticasone -Umeclidin-Vilant (TRELEGY ELLIPTA ) 100-62.5-25 MCG/ACT AEPB Inhale 1 puff into the lungs daily. 30 each 11   ipratropium-albuterol  (DUONEB) 0.5-2.5 (3) MG/3ML SOLN Take 3 mLs by nebulization every 4 (four) hours as needed (shortness of breath).     nitroGLYCERIN  (NITROSTAT ) 0.4 MG SL tablet Place 0.4 mg under the tongue every 5 (five) minutes x 3 doses as needed (if no relief after 2nd dose, proceed to ED or call 911).     NON FORMULARY Pt uses a  cpap nightly     Omega 3-6-9 Fatty Acids (OMEGA 3-6-9 COMPLEX PO) Take 1 capsule by mouth daily.     OVER THE COUNTER MEDICATION Take 1 tablet by mouth daily. Healthy Nerve Multivitamin     psyllium (METAMUCIL) 58.6 % powder Take 1 packet by mouth daily.     warfarin (COUMADIN ) 2 MG tablet TAKE 2 TABLETS BY MOUTH ONCE DAILY OR AS DIRECTED (Patient taking differently: Take 2-4 mg by mouth See admin instructions. Take 2 mg (one tab) once daily on Sundays. Take 4 mg (two tabs) once daily all other days.) 180 tablet 1   enoxaparin  (LOVENOX ) 120 MG/0.8ML injection Inject 0.8 mLs (120 mg total) into the skin every 12 (twelve) hours. 8 mL 1   minocycline (MINOCIN) 100 MG capsule Take 100 mg by mouth daily. (Patient not taking: Reported on 06/28/2024)      Physical Exam: There were no vitals taken for this visit. Gen: male, NAD Abd: soft, non-distended, bulge marked  No results found for this or any previous visit (from the past 48 hours). No results found.  Assessment/Plan 68 y/o M with a umbilical hernia  - Will proceed to the OR. We discussed the alternatives and potential risks of surgery, including but not limited to: bleeding, infection, damage to bowel or surrounding structures, mesh complications, chronic pain, recurrent hernia, and need for additional procedures. All questions were addressed and consent was obtained.    [name] Surgery 07/15/2024, 9:47 AM Please see Amion for pager number during day hours 7:00am-4:30pm or 7:00am -11:30am on weekends     [1] No Known Allergies

## 2024-07-15 NOTE — Anesthesia Procedure Notes (Addendum)
 Procedure Name: Intubation Date/Time: 07/15/2024 10:55 AM  Performed by: Erick Fitz, CRNAPre-anesthesia Checklist: Patient identified, Emergency Drugs available, Suction available, Patient being monitored and Timeout performed Patient Re-evaluated:Patient Re-evaluated prior to induction Oxygen Delivery Method: Circle system utilized Preoxygenation: Pre-oxygenation with 100% oxygen Induction Type: IV induction Ventilation: Mask ventilation without difficulty Tube type: Oral Tube size: 8.0 mm Number of attempts: 1 Airway Equipment and Method: Stylet Placement Confirmation: ETT inserted through vocal cords under direct vision, positive ETCO2, CO2 detector and breath sounds checked- equal and bilateral Secured at: 23.5 cm Tube secured with: Tape (secured with 1/2 white silk tape) Dental Injury: Teeth and Oropharynx as per pre-operative assessment

## 2024-07-15 NOTE — Transfer of Care (Signed)
 Immediate Anesthesia Transfer of Care Note  Patient: [name]  Procedure(s) Performed: REPAIR, HERNIA, VENTRAL, ROBOT-ASSISTED (Abdomen)  Patient Location: PACU  Anesthesia Type:General  Level of Consciousness: awake, alert , oriented, and patient cooperative  Airway & Oxygen Therapy: Patient Spontanous Breathing and Patient connected to face mask oxygen  Post-op Assessment: Report given to RN and Post -op Vital signs reviewed and stable  Post vital signs: Reviewed and stable  Last Vitals:  Vitals Value Taken Time  BP 155/95 07/15/24 13:45  Temp    Pulse 67 07/15/24 13:48  Resp 14 07/15/24 13:48  SpO2 97 % 07/15/24 13:48  Vitals shown include unfiled device data.  Last Pain:  Vitals:   07/15/24 1011  TempSrc:   PainSc: 0-No pain         Complications: There were no known notable events for this encounter.

## 2024-07-15 NOTE — Discharge Instructions (Signed)
 Home Care After Hernia Repair  Activity  Limit activity for the first 24 hours, then you may return to normal daily activities. Returning to normal daily activities as soon as you can following surgery will enhance recovery time.  No heavy lifting pushing or pulling, anything heavier than 10 pounds (gallon of milk weighs approx. 8.8 pounds) for 4-6 weeks from surgery date.  Do not mow the lawn, use a vacuum cleaner, or do any other strenuous activities without first consulting your surgical team.  Climb stairs slowly and watch your step.  Walk as often as you feel able to increase strength and endurance.  No driving or operating heavy machinery within 24 hours of taking narcotic pain medication.  Diet  Drink plenty of fluids and eat a light meal on the night of surgery. Some patients may find their appetite is poor for a week or two after surgery. This is a normal result of the stress of surgery-your appetite will return in time.   There are no specific diet restrictions after surgery.  Dressings and Wound Care  Dermabond/Durabond (skin glue): This will usually remain in place for 10-14 days, then naturally fall off your skin. You may take a shower 24 hrs after  surgery, carefully wash, not scrub the incision site with a mild non-scented soap. Pat dry with a soft towel.  Do not pick or peel skin glue off.  You can shower and let the water fall on the dressings above. Do not soak or submerge your incision(s) in a bath tub, hot tub, or swimming pool, until your doctor says it is ok to do so or the incision(s) have completely healed, usually about 2-4 weeks.  Do not use creams, powder, salves or balms on your incision(s).  What to Expect After Surgery   Moderate discomfort controlled with medications  Minimal drainage from incision  You may feel pain in one or both shoulders. This pain comes from the gas still left in your belly after the surgery, if you had laparoscopic surgery (several small  incisions). The pain should ease over several days to a week. Ambulation will help with this pain.   Belly swelling  Feeling fatigue and weak  Constipation after surgery is common. Drink plenty fluids and eat a high fiber diet.  Swelling - In some patients might feel that their hernia has returned after surgery-DO NOT Worry this is normal. Swelling may be due to the development of a seroma. Seroma is fluid that has built up where the hernia was repaired this is a normal result after surgery and it will slowly reabsorb back into your body over the next several weeks.   Pain Control: Prescribed Non-Narcotic Pain Medication  You will be given three prescriptions.  Two of them will be for prescription strength ibuprofen (i.e. Advil) and prescription strength acetaminophen (i.e. Tylenol).  The vast majority of patients will just need these two medications.  One prescription will be for a 'rescue' prescription of an oral narcotic (oxycodone).  You may fill this if needed.  You will alternate taking the ibuprofen (600mg ) every 6 hours and also the acetaminophen (650mg ) every 6 hours so that you are taking one of those medications every 3 hours.  For example: o 0800 - take ibuprofen 600mg  o 1100 - take acetaminophen 650mg  o 1400 - take ibuprofen 600mg  o 1700 - take acetaminophen 650mg  o Etc.  Continue taking this alternating pattern of ibuprofen and acetaminophen for 3 days  If you cannot take one  or the other of these medications, just take the one you can every 6 hours.  If you are comfortable at night, you don't have to wake up and take a medication.  If you are still uncomfortable after taking either ibuprofen or acetaminophen, try gentle stretching exercise and ice packs (a bag of frozen vegetables works great).  If you are still uncomfortable, you may fill the narcotic prescription of Oxycodone and take as directed.  Once you have completed these prescriptions, your pain level should be low enough  to stop taking medications altogether or just use an over the counter medication (ibuprofen or acetaminophen) as needed.   Pain Control: Over the Counter Medications to take as needed:  Colace/Docusate: May be prescribed by your surgeon to prevent constipation caused by the combination of narcotics, effects of anesthesia, and decreased ambulation.  Hold for loose stools or diarrhea. Take 100 mg 1-2 times a day starting tonight.   Fiber: High fiber foods, extra liquids (water 9-13 cups/day) can also assist with constipation. Examples of high fiber foods are fruit, bran. Prune juice and water are also good liquids to drink.  Milk of Magnesia/Miralax:  If constipated despite take the over the counter stool softeners, you may take Milk of Magnesia or Miralax as directed on bottle to assist with constipation.     Pepcid/Famotidine: May be prescribed while taking naproxen (Aleve) or other NSAIDs such as ibuprofen (Motrin/Advil) to prevent stomach upset or Acid-reflux symptoms. Take 1 tablet 1-2 times a day.   **Constipation: The first bowel movement may occur anywhere between 1-5 days after surgery.  As long as you are not nauseated or not having significant abdominal pain this variation is acceptable.  Narcotic pain medications can cause constipation increasing discomfort; early discontinuation will assist with bowel management.If constipated despite taking stool softeners, you may take Milk of Magnesia or Miralax as directed on the bottle.     **Home medications: You may restart your home medications as directed by your respective Primary Care Physician or Surgeon.  When to notify your Doctor or Healthcare Team:   Sign of Wound Infection   Fever over 100 degrees.  Wound becomes extremely swollen, shows red streaks, warm to the touch, and/or drainage from the incision site or foul-smelling drainage.  Wound edges separate or opens up  Bleeding or bruising   If you have bleeding, apply pressure to the  site and hold the pressure firmly for 5 minutes. If the bleeding continues, apply pressure again and call 911. If the bleeding stopped, call your doctor to report it.   Call your doctor or nurse if you have increased bleeding from your site and increased bruising or a lump forms or gets larger under your skin at the site.  Unrelieved Pain    Call your doctor or nurse if your pain gets worse or is not eased 1 hour after taking your pain medicine, or if it is severe and uncontrolled.  Nausea and Vomiting   Call your doctor or nurse if you have nausea and vomiting that continues more than 24 hours, will not let you keep medicine down and will not let you keep fluids down  Fever, Flu-like symptoms   Fever over 100 degrees and/or chills  Gastrointestinal Bleeding Symptoms    Black tarry bowel movements.  This can be normal after surgery on the stomach, but should resolve in a day or two.    Call 911 if you suddenly have signs of blood loss such as:  Vomiting blood  Fast heart rate  Feeling faint, sweaty, or blacking out  Passing bright red blood from your rectum  Blood Clot Symptoms   Tender, swollen or reddened areas in your calf muscle or thighs.  Numbness or tingling in your lower leg or calf, or at the top of your leg or groin  Skin on your leg looks pale or blue or feels cold to touch  Chest pain or have trouble breathing, lightheadedness, fast heart rate  Sudden Onset of Symptoms    Call 911 if you suddenly have:  Leg weakness and spasm  Loss of bladder or bowel function  Seizure  Confusion, severe headache, dizziness or feeling unsteady, problems talking, difficulty swallowing, and/or numbness or muscle weakness as these could be signs of a stroke.   Follow up Appointment Your follow up appointment should be scheduled 2-3 weeks after your surgery date.  If you have not previously scheduled for a follow-up visit you can be scheduled by contacting [phone].

## 2024-07-18 ENCOUNTER — Encounter (HOSPITAL_COMMUNITY): Payer: Self-pay | Admitting: General Surgery

## 2024-07-20 ENCOUNTER — Ambulatory Visit: Attending: Internal Medicine

## 2024-07-20 DIAGNOSIS — I2609 Other pulmonary embolism with acute cor pulmonale: Secondary | ICD-10-CM | POA: Diagnosis not present

## 2024-07-20 DIAGNOSIS — I82433 Acute embolism and thrombosis of popliteal vein, bilateral: Secondary | ICD-10-CM

## 2024-07-20 DIAGNOSIS — Z7901 Long term (current) use of anticoagulants: Secondary | ICD-10-CM

## 2024-07-20 DIAGNOSIS — Z5181 Encounter for therapeutic drug level monitoring: Secondary | ICD-10-CM

## 2024-07-20 LAB — POCT INR: INR: 1.9 — AB (ref 2.0–3.0)

## 2024-07-20 NOTE — Patient Instructions (Signed)
 S/P hernia repair on 07/15/24.   Take warfarin 3 tablets tonight then resume 2 tablets daily except 1 tablet on Sundays Stop Lovenox  Recheck in 3 wks

## 2024-07-20 NOTE — Progress Notes (Signed)
 INR 1.9; Please see anticoagulation encounter

## 2024-08-10 ENCOUNTER — Ambulatory Visit: Attending: Internal Medicine | Admitting: *Deleted

## 2024-08-10 DIAGNOSIS — I82433 Acute embolism and thrombosis of popliteal vein, bilateral: Secondary | ICD-10-CM | POA: Diagnosis not present

## 2024-08-10 DIAGNOSIS — Z7901 Long term (current) use of anticoagulants: Secondary | ICD-10-CM | POA: Diagnosis not present

## 2024-08-10 DIAGNOSIS — Z5181 Encounter for therapeutic drug level monitoring: Secondary | ICD-10-CM

## 2024-08-10 DIAGNOSIS — I2609 Other pulmonary embolism with acute cor pulmonale: Secondary | ICD-10-CM | POA: Diagnosis not present

## 2024-08-10 LAB — POCT INR: INR: 2.6 (ref 2.0–3.0)

## 2024-08-10 NOTE — Patient Instructions (Signed)
 S/P hernia repair on 07/15/24.   Continue warfarin 2 tablets daily except 1 tablet on Sundays Recheck in 4 wks

## 2024-08-10 NOTE — Progress Notes (Signed)
 INR 2.6; Please see anticoagulation encounter

## 2024-08-15 ENCOUNTER — Ambulatory Visit (HOSPITAL_COMMUNITY)
Admission: RE | Admit: 2024-08-15 | Discharge: 2024-08-15 | Disposition: A | Discharge status: Home / Self Care | Source: Ambulatory Visit | Attending: Acute Care | Admitting: Acute Care

## 2024-08-15 DIAGNOSIS — R911 Solitary pulmonary nodule: Secondary | ICD-10-CM | POA: Insufficient documentation

## 2024-08-18 ENCOUNTER — Ambulatory Visit

## 2024-08-18 VITALS — BP 125/84 | HR 84 | Temp 98.8°F | Ht 73.0 in | Wt 180.2 lb

## 2024-08-18 DIAGNOSIS — E559 Vitamin D deficiency, unspecified: Secondary | ICD-10-CM | POA: Diagnosis not present

## 2024-08-18 DIAGNOSIS — G629 Polyneuropathy, unspecified: Secondary | ICD-10-CM

## 2024-08-18 DIAGNOSIS — R5383 Other fatigue: Secondary | ICD-10-CM

## 2024-08-18 MED ORDER — DULOXETINE HCL 20 MG PO CPEP: 20.0000 mg | ORAL_CAPSULE | Freq: Every day | ORAL | 2 refills | Status: AC

## 2024-08-18 NOTE — Progress Notes (Signed)
 "  Acute Office Visit  Subjective:     Patient ID: [name], male    DOB: 08-26-1955, 69 y.o.   MRN: [id number]  Chief Complaint  Patient presents with   Fatigue    Normally very active - walks 7-10 mile a day  Lately very tired an not feeling like doing much - comes and goes     HPI Patient is in today complaining of fatigue.  Says that he has been very tired lately and has not been feeling like doing much activity.  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  Walks 7 to 10 miles per day.  Normally finds joy in doing these things but recently has felt very fatigued and not felt like doing these activities. Denies dizziness, headaches, blurred vision, or history of sleep apnea.  Says this has been more persistent in the past few months.  Normally gets 6 to 7 hours of sleep per night, but recently has been getting 4 to 5 hours.  Denies snoring.  Reports that he has been waking up more frequently at 3 AM to use the restroom, and struggles to fall back asleep.  Says that he does read an hour before bed each night and tries to avoid screen time before bed.  Does endorse caffeine use, and drinks 3 to 4 cups of coffee throughout the day. Has a history of vitamin D  deficiency and takes 1000 IUs of vitamin D  supplement per day.  Also endorses neuropathy in his bilateral feet.  Says that it feels like pins-and-needles, and has nerve pain.  Also reports cold intolerance in his feet.  Has tried gabapentin  and Lyrica , with no improvement in symptoms.  Says he did not like the side effects associated with these medications.  Review of Systems  Constitutional:  Positive for fatigue. Negative for fever.  Eyes:  Negative for visual disturbance.  Respiratory:  Negative for cough, chest tightness, shortness of breath and wheezing.   Cardiovascular:  Negative for chest pain, palpitations and leg swelling.  Neurological:  Negative for headaches.  Psychiatric/Behavioral:  Positive for sleep  disturbance.        Objective:    Today's Vitals   08/18/24 0809  BP: 125/84  Pulse: 84  Temp: 98.8 F (37.1 C)  SpO2: 97%  Weight: 180 lb 3.2 oz (81.7 kg)  Height: 6' 1 (1.854 m)   Body mass index is 23.77 kg/m.   Physical Exam Vitals and nursing note reviewed.  Constitutional:      General: He is not in acute distress.    Appearance: Normal appearance. He is not ill-appearing.  Cardiovascular:     Rate and Rhythm: Normal rate and regular rhythm.     Heart sounds: Normal heart sounds, S1 normal and S2 normal. No murmur heard. Pulmonary:     Effort: Pulmonary effort is normal. No respiratory distress.     Breath sounds: Normal breath sounds. No wheezing.  Musculoskeletal:     Right lower leg: No edema.     Left lower leg: No edema.  Neurological:     Mental Status: He is alert.  Psychiatric:        Mood and Affect: Mood normal.        Behavior: Behavior normal.        Thought Content: Thought content normal.        Judgment: Judgment normal.       08/18/2024    8:10 AM 04/18/2024    8:30  AM 11/17/2023    1:13 PM 09/17/2023    8:53 AM 04/13/2023   10:11 AM  Depression screen PHQ 2/9  Decreased Interest 1 0 0 0 0  Down, Depressed, Hopeless 0 0 0 0 0  PHQ - 2 Score 1 0 0 0 0  Altered sleeping 2 0 0  0  Tired, decreased energy 3 0 0  0  Change in appetite 0 0 0  0  Feeling bad or failure about yourself  0 0 0  0  Trouble concentrating 0 0 0  0  Moving slowly or fidgety/restless 0 0 0  0  Suicidal thoughts 0 0 0  0  PHQ-9 Score 6 0  0   0   Difficult doing work/chores Not difficult at all Not difficult at all Not difficult at all  Not difficult at all     Data saved with a previous flowsheet row definition       08/18/2024    8:11 AM 11/17/2023    1:13 PM 03/04/2023    9:50 AM 01/28/2023    9:56 AM  GAD 7 : Generalized Anxiety Score  Nervous, Anxious, on Edge 0 0 0 0  Control/stop worrying 0 0 0 0  Worry too much - different things 0 0 0 0  Trouble relaxing  1 0 0 0  Restless 1 0 0 0  Easily annoyed or irritable 1 0 0 0  Afraid - awful might happen 1 0 0 0  Total GAD 7 Score 4 0 0 0  Anxiety Difficulty Not difficult at all Not difficult at all Not difficult at all Not difficult at all   No results found for any visits on 08/18/24.    Assessment & Plan:  1. Fatigue, unspecified type (Primary) -Discussed with patient to minimize caffeine intake after 12 PM to help with sleep.  Advised patient to check labs to assess for any underlying thyroid disorders, anemia, or vitamin D  deficiencies.  -Recommended patient to start taking duloxetine  for increased fatigue and neuropathy symptoms.  Patient would like to research medication, but I agreed to go ahead and prescribe this for him. -Advised patient that this may take up to 4-6 weeks for the medication to start working.  -Educated patient on appropriate use and potential side effects such as mild GI effects and headache, which should resolve after two weeks.  -Gave box warning of suicidal ideations, and advised patient that if he experiences this to stop the medication immediately and seek care. Patient agrees.   - TSH + free T4 - CBC with Differential/Platelet - VITAMIN D  25 Hydroxy (Vit-D Deficiency, Fractures) - Iron, TIBC and Ferritin Panel - DULoxetine  (CYMBALTA ) 20 MG capsule; Take 1 capsule (20 mg total) by mouth daily.  Dispense: 30 capsule; Refill: 2  2. Peripheral polyneuropathy - Advised patient that he does not have a history of increased glucose in previous labs. Will hold off on checking hemoglobin A1C at this time.  Discussed with patient to plan to check vitamin B12 levels along with iron panel to assess for any underlying vitamin B12 deficiency or iron deficiency anemia. - Vitamin B12 - Iron, TIBC and Ferritin Panel   3. Vitamin D  deficiency - Patient currently taking 1000 IUs of vitamin D .  Agreed to recheck vitamin D  levels at this time.  - VITAMIN D  25 Hydroxy (Vit-D Deficiency,  Fractures)  Return in about 3 months (around 11/16/2024) for follow-up regarding starting cymbalta . SABRA Damien KATHEE Gerard, FNP  "

## 2024-08-19 ENCOUNTER — Ambulatory Visit: Payer: Self-pay

## 2024-08-19 DIAGNOSIS — D696 Thrombocytopenia, unspecified: Secondary | ICD-10-CM

## 2024-08-19 LAB — CBC WITH DIFFERENTIAL/PLATELET
Basophils Absolute: 0 x10E3/uL (ref 0.0–0.2)
Basos: 1 %
EOS (ABSOLUTE): 0.2 x10E3/uL (ref 0.0–0.4)
Eos: 6 %
Hematocrit: 48.6 % (ref 37.5–51.0)
Hemoglobin: 16.4 g/dL (ref 13.0–17.7)
Immature Grans (Abs): 0 x10E3/uL (ref 0.0–0.1)
Immature Granulocytes: 0 %
Lymphocytes Absolute: 1.7 x10E3/uL (ref 0.7–3.1)
Lymphs: 40 %
MCH: 32.2 pg (ref 26.6–33.0)
MCHC: 33.7 g/dL (ref 31.5–35.7)
MCV: 95 fL (ref 79–97)
Monocytes Absolute: 0.5 x10E3/uL (ref 0.1–0.9)
Monocytes: 13 %
Neutrophils Absolute: 1.7 x10E3/uL (ref 1.4–7.0)
Neutrophils: 40 %
Platelets: 109 x10E3/uL — ABNORMAL LOW (ref 150–450)
RBC: 5.1 x10E6/uL (ref 4.14–5.80)
RDW: 12.3 % (ref 11.6–15.4)
WBC: 4.2 x10E3/uL (ref 3.4–10.8)

## 2024-08-19 LAB — IRON,TIBC AND FERRITIN PANEL
Ferritin: 299 ng/mL (ref 30–400)
Iron Saturation: 28 % (ref 15–55)
Iron: 64 ug/dL (ref 38–169)
Total Iron Binding Capacity: 225 ug/dL — ABNORMAL LOW (ref 250–450)
UIBC: 161 ug/dL (ref 111–343)

## 2024-08-19 LAB — VITAMIN D 25 HYDROXY (VIT D DEFICIENCY, FRACTURES): Vit D, 25-Hydroxy: 40.2 ng/mL (ref 30.0–100.0)

## 2024-08-19 LAB — TSH+FREE T4
Free T4: 1.1 ng/dL (ref 0.82–1.77)
TSH: 1.97 u[IU]/mL (ref 0.450–4.500)

## 2024-08-19 LAB — VITAMIN B12: Vitamin B-12: 1552 pg/mL — ABNORMAL HIGH (ref 232–1245)

## 2024-08-24 ENCOUNTER — Telehealth: Payer: Self-pay | Admitting: *Deleted

## 2024-08-24 DIAGNOSIS — R911 Solitary pulmonary nodule: Secondary | ICD-10-CM

## 2024-08-24 DIAGNOSIS — Z87891 Personal history of nicotine dependence: Secondary | ICD-10-CM

## 2024-08-24 NOTE — Telephone Encounter (Signed)
 Spoke with pt and reviewed lung screening CT results. Areas seen on previous scan have either resolved or remain stable. Per ov note from Lauraine Lites on 03/21/24 we will schedule a 6 month follow up scan to make sure of stability. PT verbalized understanding and wants a call closer to that time to schedule. Results/ plans faxed to PCP.

## 2024-09-07 ENCOUNTER — Ambulatory Visit: Admitting: *Deleted

## 2024-09-07 DIAGNOSIS — I82433 Acute embolism and thrombosis of popliteal vein, bilateral: Secondary | ICD-10-CM | POA: Diagnosis not present

## 2024-09-07 DIAGNOSIS — I2609 Other pulmonary embolism with acute cor pulmonale: Secondary | ICD-10-CM

## 2024-09-07 DIAGNOSIS — Z5181 Encounter for therapeutic drug level monitoring: Secondary | ICD-10-CM

## 2024-09-07 DIAGNOSIS — Z7901 Long term (current) use of anticoagulants: Secondary | ICD-10-CM

## 2024-09-07 LAB — POCT INR: INR: 2.5 (ref 2.0–3.0)

## 2024-09-07 NOTE — Progress Notes (Signed)
 INR 2.5

## 2024-09-07 NOTE — Patient Instructions (Signed)
 S/P hernia repair on 07/15/24.   Continue warfarin 2 tablets daily except 1 tablet on Sundays Recheck in 6 wks

## 2024-10-03 ENCOUNTER — Inpatient Hospital Stay

## 2024-10-04 ENCOUNTER — Ambulatory Visit: Admitting: Urology

## 2024-10-11 ENCOUNTER — Inpatient Hospital Stay: Admitting: Physician Assistant

## 2024-10-19 ENCOUNTER — Ambulatory Visit
